# Patient Record
Sex: Male | Born: 1940
Health system: Southern US, Community
[De-identification: ages and names within clinical notes are randomized; demographics above are authoritative.]

## PROBLEM LIST (undated history)

## (undated) ENCOUNTER — Telehealth

## (undated) ENCOUNTER — Encounter

## (undated) ENCOUNTER — Ambulatory Visit

## (undated) ENCOUNTER — Encounter: Attending: Nurse Practitioner | Primary: Nurse Practitioner

## (undated) ENCOUNTER — Ambulatory Visit: Payer: MEDICARE

## (undated) ENCOUNTER — Telehealth: Attending: Nurse Practitioner | Primary: Nurse Practitioner

## (undated) ENCOUNTER — Telehealth: Attending: Hematology & Oncology | Primary: Hematology & Oncology

## (undated) ENCOUNTER — Encounter: Attending: Urology | Primary: Urology

## (undated) ENCOUNTER — Telehealth: Attending: Otolaryngology | Primary: Otolaryngology

## (undated) ENCOUNTER — Encounter: Attending: Otolaryngology | Primary: Otolaryngology

## (undated) ENCOUNTER — Encounter: Attending: Medical Oncology | Primary: Medical Oncology

## (undated) ENCOUNTER — Telehealth: Attending: Children | Primary: Children

## (undated) ENCOUNTER — Encounter: Attending: Family | Primary: Family

## (undated) ENCOUNTER — Ambulatory Visit: Payer: MEDICARE | Attending: Vascular Surgery | Primary: Vascular Surgery

## (undated) ENCOUNTER — Ambulatory Visit: Payer: MEDICARE | Attending: Nurse Practitioner | Primary: Nurse Practitioner

## (undated) ENCOUNTER — Ambulatory Visit
Attending: Student in an Organized Health Care Education/Training Program | Primary: Student in an Organized Health Care Education/Training Program

## (undated) ENCOUNTER — Encounter: Attending: Children | Primary: Children

## (undated) ENCOUNTER — Inpatient Hospital Stay

## (undated) DIAGNOSIS — I4891 Unspecified atrial fibrillation: Secondary | ICD-10-CM

## (undated) DIAGNOSIS — M1990 Unspecified osteoarthritis, unspecified site: Secondary | ICD-10-CM

## (undated) DIAGNOSIS — I712 Thoracic aortic aneurysm, without rupture: Secondary | ICD-10-CM

## (undated) DIAGNOSIS — I10 Essential (primary) hypertension: Secondary | ICD-10-CM

## (undated) DIAGNOSIS — I9789 Other postprocedural complications and disorders of the circulatory system, not elsewhere classified: Secondary | ICD-10-CM

## (undated) DIAGNOSIS — I480 Paroxysmal atrial fibrillation: Secondary | ICD-10-CM

## (undated) DIAGNOSIS — I7121 Aneurysm of the ascending aorta, without rupture: Secondary | ICD-10-CM

## (undated) DIAGNOSIS — I5032 Chronic diastolic (congestive) heart failure: Secondary | ICD-10-CM

## (undated) DIAGNOSIS — I471 Supraventricular tachycardia, unspecified: Secondary | ICD-10-CM

## (undated) DIAGNOSIS — E782 Mixed hyperlipidemia: Secondary | ICD-10-CM

## (undated) DIAGNOSIS — I219 Acute myocardial infarction, unspecified: Secondary | ICD-10-CM

## (undated) DIAGNOSIS — I739 Peripheral vascular disease, unspecified: Secondary | ICD-10-CM

## (undated) DIAGNOSIS — N400 Enlarged prostate without lower urinary tract symptoms: Secondary | ICD-10-CM

## (undated) DIAGNOSIS — I2510 Atherosclerotic heart disease of native coronary artery without angina pectoris: Secondary | ICD-10-CM

## (undated) HISTORY — DX: Mixed hyperlipidemia: E78.2

## (undated) HISTORY — PX: HERNIA REPAIR: SHX51

## (undated) HISTORY — DX: Other postprocedural complications and disorders of the circulatory system, not elsewhere classified: I97.89

## (undated) HISTORY — PX: ASCENDING AORTIC ANEURYSM REPAIR: SHX1191

## (undated) HISTORY — DX: Atherosclerotic heart disease of native coronary artery without angina pectoris: I25.10

## (undated) HISTORY — DX: Unspecified atrial fibrillation: I48.91

## (undated) HISTORY — PX: TRANSURETHRAL RESECTION OF PROSTATE: SHX73

## (undated) HISTORY — DX: Paroxysmal atrial fibrillation: I48.0

## (undated) HISTORY — DX: Essential (primary) hypertension: I10

## (undated) HISTORY — DX: Peripheral vascular disease, unspecified: I73.9

## (undated) HISTORY — DX: Thoracic aortic aneurysm, without rupture: I71.2

## (undated) HISTORY — DX: Aneurysm of the ascending aorta, without rupture: I71.21

## (undated) HISTORY — DX: Supraventricular tachycardia: I47.1

## (undated) HISTORY — DX: Supraventricular tachycardia, unspecified: I47.10

## (undated) HISTORY — PX: CORONARY ARTERY BYPASS GRAFT: SHX141

---
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
## 2000-04-04 ENCOUNTER — Inpatient Hospital Stay (HOSPITAL_COMMUNITY): Admission: EM | Admit: 2000-04-04 | Discharge: 2000-04-06 | Payer: Self-pay | Admitting: Internal Medicine

## 2000-09-03 ENCOUNTER — Encounter: Payer: Self-pay | Admitting: Cardiology

## 2000-09-03 ENCOUNTER — Ambulatory Visit (HOSPITAL_COMMUNITY): Admission: RE | Admit: 2000-09-03 | Discharge: 2000-09-03 | Payer: Self-pay | Admitting: Cardiology

## 2000-09-05 ENCOUNTER — Ambulatory Visit (HOSPITAL_COMMUNITY): Admission: RE | Admit: 2000-09-05 | Discharge: 2000-09-06 | Payer: Self-pay | Admitting: Cardiology

## 2001-05-11 ENCOUNTER — Inpatient Hospital Stay (HOSPITAL_COMMUNITY): Admission: EM | Admit: 2001-05-11 | Discharge: 2001-05-13 | Payer: Self-pay | Admitting: Cardiology

## 2001-05-11 ENCOUNTER — Encounter: Payer: Self-pay | Admitting: Emergency Medicine

## 2001-05-29 ENCOUNTER — Encounter: Payer: Self-pay | Admitting: Cardiology

## 2001-05-29 ENCOUNTER — Ambulatory Visit (HOSPITAL_COMMUNITY): Admission: RE | Admit: 2001-05-29 | Discharge: 2001-05-29 | Payer: Self-pay | Admitting: Cardiology

## 2002-03-24 ENCOUNTER — Encounter (HOSPITAL_COMMUNITY): Admission: RE | Admit: 2002-03-24 | Discharge: 2002-04-23 | Payer: Self-pay | Admitting: Cardiology

## 2002-03-24 ENCOUNTER — Encounter: Payer: Self-pay | Admitting: Cardiology

## 2002-08-27 ENCOUNTER — Encounter: Payer: Self-pay | Admitting: Cardiology

## 2002-08-27 ENCOUNTER — Ambulatory Visit (HOSPITAL_COMMUNITY): Admission: RE | Admit: 2002-08-27 | Discharge: 2002-08-28 | Payer: Self-pay | Admitting: Cardiology

## 2005-04-18 ENCOUNTER — Inpatient Hospital Stay (HOSPITAL_COMMUNITY): Admission: EM | Admit: 2005-04-18 | Discharge: 2005-04-19 | Payer: Self-pay | Admitting: Emergency Medicine

## 2005-04-19 ENCOUNTER — Ambulatory Visit: Payer: Self-pay | Admitting: Cardiology

## 2005-11-20 ENCOUNTER — Emergency Department (HOSPITAL_COMMUNITY): Admission: EM | Admit: 2005-11-20 | Discharge: 2005-11-20 | Payer: Self-pay | Admitting: Emergency Medicine

## 2006-09-04 ENCOUNTER — Ambulatory Visit: Payer: Self-pay | Admitting: Cardiology

## 2007-04-22 ENCOUNTER — Ambulatory Visit: Payer: Self-pay | Admitting: Cardiology

## 2007-06-25 ENCOUNTER — Ambulatory Visit: Payer: Self-pay | Admitting: Cardiology

## 2008-03-02 ENCOUNTER — Ambulatory Visit: Payer: Self-pay | Admitting: Cardiology

## 2008-07-28 ENCOUNTER — Ambulatory Visit: Payer: Self-pay | Admitting: Cardiology

## 2008-08-16 ENCOUNTER — Ambulatory Visit: Payer: Self-pay | Admitting: Cardiology

## 2008-08-16 ENCOUNTER — Encounter (HOSPITAL_COMMUNITY): Admission: RE | Admit: 2008-08-16 | Discharge: 2008-09-15 | Payer: Self-pay | Admitting: Pulmonary Disease

## 2008-08-27 ENCOUNTER — Ambulatory Visit: Payer: Self-pay | Admitting: Cardiology

## 2008-10-13 ENCOUNTER — Encounter: Payer: Self-pay | Admitting: Cardiology

## 2008-11-12 ENCOUNTER — Encounter: Payer: Self-pay | Admitting: Cardiology

## 2009-07-04 ENCOUNTER — Telehealth (INDEPENDENT_AMBULATORY_CARE_PROVIDER_SITE_OTHER): Payer: Self-pay

## 2009-07-20 DIAGNOSIS — E782 Mixed hyperlipidemia: Secondary | ICD-10-CM | POA: Insufficient documentation

## 2009-07-20 DIAGNOSIS — I25119 Atherosclerotic heart disease of native coronary artery with unspecified angina pectoris: Secondary | ICD-10-CM | POA: Insufficient documentation

## 2009-07-20 DIAGNOSIS — I10 Essential (primary) hypertension: Secondary | ICD-10-CM | POA: Insufficient documentation

## 2009-07-20 DIAGNOSIS — R079 Chest pain, unspecified: Secondary | ICD-10-CM | POA: Insufficient documentation

## 2009-07-20 DIAGNOSIS — I498 Other specified cardiac arrhythmias: Secondary | ICD-10-CM | POA: Insufficient documentation

## 2009-08-26 ENCOUNTER — Ambulatory Visit: Payer: Self-pay | Admitting: Cardiology

## 2009-08-31 ENCOUNTER — Encounter (INDEPENDENT_AMBULATORY_CARE_PROVIDER_SITE_OTHER): Payer: Self-pay | Admitting: *Deleted

## 2009-08-31 ENCOUNTER — Encounter: Payer: Self-pay | Admitting: Cardiology

## 2009-08-31 LAB — CONVERTED CEMR LAB
ALT: 13 units/L
AST: 14 units/L
Alkaline Phosphatase: 64 units/L
BUN: 16 mg/dL
Calcium: 9.2 mg/dL
Cholesterol: 113 mg/dL
HDL: 35 mg/dL
Potassium: 4.4 meq/L
Triglycerides: 155 mg/dL

## 2009-09-05 ENCOUNTER — Encounter: Payer: Self-pay | Admitting: Cardiovascular Disease

## 2009-09-05 ENCOUNTER — Encounter: Payer: Self-pay | Admitting: Cardiology

## 2009-09-05 LAB — CONVERTED CEMR LAB
ALT: 13 units/L (ref 0–53)
AST: 14 units/L (ref 0–37)
CO2: 24 meq/L (ref 19–32)
Calcium: 9.2 mg/dL (ref 8.4–10.5)
Chloride: 104 meq/L (ref 96–112)
Cholesterol: 113 mg/dL (ref 0–200)
Sodium: 139 meq/L (ref 135–145)
Total Bilirubin: 0.6 mg/dL (ref 0.3–1.2)
Total Protein: 6.9 g/dL (ref 6.0–8.3)
VLDL: 31 mg/dL (ref 0–40)

## 2009-12-23 ENCOUNTER — Encounter (INDEPENDENT_AMBULATORY_CARE_PROVIDER_SITE_OTHER): Payer: Self-pay | Admitting: *Deleted

## 2009-12-28 ENCOUNTER — Ambulatory Visit: Payer: Self-pay | Admitting: Cardiology

## 2009-12-28 ENCOUNTER — Encounter: Payer: Self-pay | Admitting: Cardiology

## 2009-12-28 DIAGNOSIS — I319 Disease of pericardium, unspecified: Secondary | ICD-10-CM | POA: Insufficient documentation

## 2009-12-28 DIAGNOSIS — R55 Syncope and collapse: Secondary | ICD-10-CM | POA: Insufficient documentation

## 2010-01-04 ENCOUNTER — Encounter: Payer: Self-pay | Admitting: Cardiology

## 2010-01-04 ENCOUNTER — Encounter (HOSPITAL_COMMUNITY): Admission: RE | Admit: 2010-01-04 | Discharge: 2010-01-04 | Payer: Self-pay | Admitting: Cardiology

## 2010-01-04 ENCOUNTER — Ambulatory Visit: Payer: Self-pay | Admitting: Cardiology

## 2010-01-12 ENCOUNTER — Encounter: Payer: Self-pay | Admitting: Cardiology

## 2010-02-15 ENCOUNTER — Telehealth (INDEPENDENT_AMBULATORY_CARE_PROVIDER_SITE_OTHER): Payer: Self-pay

## 2010-03-01 LAB — CONVERTED CEMR LAB
AST: 12 units/L (ref 0–37)
Albumin: 4 g/dL (ref 3.5–5.2)
Bilirubin, Direct: 0.1 mg/dL (ref 0.0–0.3)
HDL: 36 mg/dL — ABNORMAL LOW (ref >39)
LDL Cholesterol: 56 mg/dL (ref 0–99)
PSA: 16.87 ng/mL — ABNORMAL HIGH (ref 0.10–4.00)
Total Bilirubin: 0.5 mg/dL (ref 0.3–1.2)
Total CHOL/HDL Ratio: 3.2
VLDL: 23 mg/dL (ref 0–40)

## 2010-03-29 ENCOUNTER — Telehealth (INDEPENDENT_AMBULATORY_CARE_PROVIDER_SITE_OTHER): Payer: Self-pay

## 2010-04-13 ENCOUNTER — Ambulatory Visit: Payer: Self-pay | Admitting: Cardiology

## 2010-04-13 DIAGNOSIS — I712 Thoracic aortic aneurysm, without rupture, unspecified: Secondary | ICD-10-CM | POA: Insufficient documentation

## 2010-04-13 DIAGNOSIS — I2584 Coronary atherosclerosis due to calcified coronary lesion: Secondary | ICD-10-CM | POA: Insufficient documentation

## 2010-04-13 DIAGNOSIS — I2510 Atherosclerotic heart disease of native coronary artery without angina pectoris: Secondary | ICD-10-CM | POA: Insufficient documentation

## 2010-04-13 DIAGNOSIS — I714 Abdominal aortic aneurysm, without rupture, unspecified: Secondary | ICD-10-CM | POA: Insufficient documentation

## 2010-04-14 ENCOUNTER — Ambulatory Visit (HOSPITAL_COMMUNITY): Admission: RE | Admit: 2010-04-14 | Discharge: 2010-04-14 | Payer: Self-pay | Admitting: Cardiology

## 2010-04-14 DIAGNOSIS — E041 Nontoxic single thyroid nodule: Secondary | ICD-10-CM | POA: Insufficient documentation

## 2010-04-14 LAB — CONVERTED CEMR LAB
CO2: 24 meq/L (ref 19–32)
Chloride: 103 meq/L (ref 96–112)
Potassium: 4.2 meq/L (ref 3.5–5.3)
Sodium: 137 meq/L (ref 135–145)

## 2010-04-17 ENCOUNTER — Ambulatory Visit (HOSPITAL_COMMUNITY)
Admission: RE | Admit: 2010-04-17 | Discharge: 2010-04-17 | Payer: Self-pay | Source: Home / Self Care | Admitting: Cardiology

## 2010-04-25 ENCOUNTER — Encounter: Payer: Self-pay | Admitting: Cardiology

## 2010-04-25 ENCOUNTER — Ambulatory Visit: Payer: Self-pay | Admitting: Surgery

## 2010-06-04 ENCOUNTER — Encounter: Payer: Self-pay | Admitting: *Deleted

## 2010-06-13 NOTE — Progress Notes (Signed)
Summary: Labs   Phone Note Outgoing Call   Call placed by: Larita Fife Via LPN,  February 15, 2010 1:37 PM Summary of Call: Pt. forgot to have labs drawn, we will mail new orders. Also, pt. asked if we could add a psa to labs. Initial call taken by: Larita Fife Via LPN,  February 15, 2010 1:39 PM

## 2010-06-13 NOTE — Progress Notes (Signed)
Summary: Results   Phone Note Call from Patient Call back at [phone]   Caller: Patient Reason for Call: Talk to Nurse Summary of Call: patient was upset that no one called him with results of labs that were done 03/01/10 / pls call patient with results / tg Initial call taken by: Raechel Ache [facility]  March 29, 2010 4:01 PM  Follow-up for Phone Call        Pt. given lab results and per pt's request, a copy of results mailed to pt.  Follow-up by: Larita Fife Via LPN,  March 30, 2010 9:09 AM

## 2010-06-13 NOTE — Letter (Signed)
Summary: [name] [facility] Lab Work Engineer, agricultural at [employer]  [address], [place] [address]   Phone: [phone]  Fax: [phone]     December 28, 2009 MRN: [id number]   Eric Beltran [address] [place], [place]  [address]      YOUR LAB WORK IS DUE  February 08, 2010 _________________________________________  Please go to [facility], located across the street from [facility] on the second floor.  Hours are Monday - Friday 7am until 7:30pm         Saturday 8am until 12noon    _X_  DO NOT EAT OR DRINK AFTER MIDNIGHT EVENING PRIOR TO LABWORK  __ YOUR LABWORK IS NOT FASTING --YOU MAY EAT PRIOR TO LABWORK

## 2010-06-13 NOTE — Assessment & Plan Note (Signed)
Summary: 1 YR F/U PER REMINDER LISTT/TG      Allergies Added: NKDA  Visit Type:  Follow-up Primary Provider:  [name]  CC:  No Cardiology Complaints.  History of Present Illness: Mr Eric Beltran comes in today for evaluation and management his coronary artery disease, hyperlipidemia, and hypertension.  He is having no angina or ischemic symptoms. His last stress test was about a year ago which was remarkable for an ejection fraction of 55%, hypertensive blood pressure response to exercise, and a very small area of inferior lateral redistribution.  He is overdue for lipids and LFTs.  His very compliant with his medications. He's lost about 4 pounds.  Current Medications (verified): 1)  Cialis 5 Mg Tabs (Tadalafil) .... Take 1 Tablet Prior To Sexual Activity 2)  Vytorin 10-20 Mg Tabs (Ezetimibe-Simvastatin) .... Take 1 Tablet By Mouth At Bedtime 3)  Diltiazem Hcl Er Beads 240 Mg Xr24h-Cap (Diltiazem Hcl Er Beads) .... Take 1 Tablet By Mouth Once Daily 4)  Altace 10 Mg Caps (Ramipril) .... Take 1 Tablet By Mouth Once Daily 5)  Aspir-Trin 325 Mg Tbec (Aspirin) .... Take 1 Tab Daily 6)  Flomax 0.4 Mg Caps (Tamsulosin Hcl) .... Take 1 Tab Daily 7)  Hydrochlorothiazide 25 Mg Tabs (Hydrochlorothiazide) .... Take 1 Tab Daily 8)  Fish Oil 1000 Mg Caps (Omega-3 Fatty Acids) .... Take 1 Cap Daily 9)  Daily Multi  Tabs (Multiple Vitamins-Minerals) .... Take 1 Tab Daily  Allergies (verified): No Known Drug Allergies  Past History:  Past Medical History: Last updated: 08/18/2009 Current Problems:  DYSLIPIDEMIA (ICD-272.4) HYPERTENSION (ICD-401.9) CAD (ICD-414.00) CHEST PAIN (ICD-786.50) SUPRAVENTRICULAR TACHYCARDIA (ICD-427.89)  Past Surgical History: Last updated: 08-18-2009 cath with stenting   Family History: Last updated: August 18, 2009 Father:deceased cause unknown Mother:deceased cause unknown  Social History: Last updated: 08-18-09 Retired  Widowed    Tobacco Use - No.  Alcohol Use - no Regular Exercise - no Drug Use - no  Risk Factors: Exercise: no (2009-08-18)  Risk Factors: Smoking Status: never (08/18/09)  Review of Systems       negative history of present illness  Vital Signs:  Patient profile:   70 year old male Height:      68 inches Weight:      187 pounds BMI:     28.54 Pulse rate:   55 / minute BP sitting:   123 / 72  (right arm)  Vitals Entered By: Dreama Saa, CNA (August 26, 2009 3:31 PM)  Physical Exam  General:  Well developed, well nourished, in no acute distress. ruddy complexion which is baseline Head:  normocephalic and atraumatic Eyes:  PERRLA/EOM intact; conjunctiva and lids normal. Neck:  Neck supple, no JVD. No masses, thyromegaly or abnormal cervical nodes. Chest Carianne Taira:  no deformities or breast masses noted Lungs:  Clear bilaterally to auscultation and percussion. Heart:  Non-displaced PMI, chest non-tender; regular rate and rhythm, S1, S2 without murmurs, rubs or gallops. Carotid upstroke normal, no bruit. Normal abdominal aortic size, no bruits. Femorals normal pulses, no bruits. Pedals normal pulses. No edema, no varicosities. Abdomen:  Bowel sounds positive; abdomen soft and non-tender without masses, organomegaly, or hernias noted. No hepatosplenomegaly. Msk:  Back normal, normal gait. Muscle strength and tone normal. Pulses:  pulses normal in all 4 extremities Extremities:  No clubbing or cyanosis. Neurologic:  Alert and oriented x 3. Skin:  Intact without lesions or rashes. Psych:  Normal affect.   Impression & Recommendations:  Problem # 1:  CAD (ICD-414.00) Assessment  Unchanged  His updated medication list for this problem includes:    Diltiazem Hcl Er Beads 240 Mg Xr24h-cap (Diltiazem hcl er beads) .[name]... Take 1 tablet by mouth once daily    Altace 10 Mg Caps (Ramipril) .[name]... Take 1 tablet by mouth once daily    Aspir-trin 325 Mg Tbec (Aspirin) .[name]... Take 1 tab daily  His  updated medication list for this problem includes:    Diltiazem Hcl Er Beads 240 Mg Xr24h-cap (Diltiazem hcl er beads) .[name]... Take 1 tablet by mouth once daily    Altace 10 Mg Caps (Ramipril) .[name]... Take 1 tablet by mouth once daily    Aspir-trin 325 Mg Tbec (Aspirin) .[name]... Take 1 tab daily  Problem # 2:  HYPERTENSION (ICD-401.9) Assessment: Improved  His updated medication list for this problem includes:    Diltiazem Hcl Er Beads 240 Mg Xr24h-cap (Diltiazem hcl er beads) .[name]... Take 1 tablet by mouth once daily    Altace 10 Mg Caps (Ramipril) .[name]... Take 1 tablet by mouth once daily    Aspir-trin 325 Mg Tbec (Aspirin) .[name]... Take 1 tab daily    Hydrochlorothiazide 25 Mg Tabs (Hydrochlorothiazide) .[name]... Take 1 tab daily  Future Orders: T-Comprehensive Metabolic Panel ([id number]) ... 08/29/2009  Problem # 3:  DYSLIPIDEMIA (ICD-272.4) I will obtain blood work including lipids and a comprehensive metabolic panel. His updated medication list for this problem includes:    Vytorin 10-20 Mg Tabs (Ezetimibe-simvastatin) .[name]... Take 1 tablet by mouth at bedtime  Future Orders: T-Lipid Profile ([id number]) ... 08/29/2009 T-Comprehensive Metabolic Panel [id number]) ... 08/29/2009  Patient Instructions: 1)  Your physician recommends that you schedule a follow-up appointment in: 12 months 2)  Your physician recommends that you return for lab work on: Monday 3)  Your physician recommends that you continue on your current medications as directed. Please refer to the Current Medication list given to you today.

## 2010-06-13 NOTE — Letter (Signed)
Summary: [place] Results Engineer, agricultural at [facility]  [address] [place] [address]   Phone: [phone]  Fax: [phone]      September 05, 2009 MRN: [id number]   ELIJAH PHOMMACHANH [address] [place], [place]  [address]   Dear Mr. Acero,  Your test ordered by Selena Batten has been reviewed by your physician (or physician assistant) and was found to be normal or stable. Your physician (or physician assistant) felt no changes were needed at this time.  ____ Echocardiogram  ____ Cardiac Stress Test  __X__ Lab Work  ____ Peripheral vascular study of arms, legs or neck  ____ CT scan or X-ray  ____ Lung or Breathing test  ____ Other: Please continue on current medical treatment.  Thank you.  Valera Castle, MD, F.A.C.C

## 2010-06-13 NOTE — Letter (Signed)
Summary: [place] [facility] (Nuc Med Stress)  [facility]  [address] [place] [address]   Phone: [phone]  Fax: [phone]    Nuclear Medicine 1-Day Stress Test Information Sheet  Re:     Eric Beltran   DOB:     1940/05/26 MRN:     [id number] Weight:  Appointment Date: Register at: Appointment Time: Referring MD:  _X__Exercise Stress  __Adenosine   __Dobutamine  __Lexiscan  __Persantine   __Thallium  Urgency: ____1 (next day)   ____2 (one week)    ____3 (PRN)  Patient will receive Follow Up call with results: Patient needs follow-up appointment:  Instructions regarding medication:  How to prepare for your stress test: 1. DO NOT eat or dring 6 hours prior to your arrival time. This includes no caffeine (coffee, tea, sodas, chocolate) if you were instructed to take your medications, drink water with it. 2. DO NOT use any tobacco products for at leaset 8 hours prior to arrival. 3. DO NOT wear dresses or any clothing that may have metal clasps or buttons. 4. Wear short sleeve shirts, loose clothing, and comfortalbe walking shoes. 5. DO NOT use lotions, oils or powder on your chest before the test. 6. The test will take approximately 3-4 hours from the time you arrive until completion. 7. To register the day of the test, go to the Short Stay entrance at [facility]. 8. If you must cancel your test, call [phone] as soon as you are aware.  After you arrive for test:   When you arrive at [facility], you will go to Short Stay to be registered. They will then send you to Radiology to check in. The Nuclear Medicine Tech will get you and start an IV in your arm or hand. A small amount of a radioactive tracer will then be injected into your IV. This tracer will then have to circulate for 30-45 minutes. During this time you will wait in the waiting room and you will be able to drink something without caffeine. A series of pictures will be taken  of your heart follwoing this waiting period. After the 1st set of pictures you will go to the stress lab to get ready for your stress test. During the stress test, another small amount of a radioactive tracer will be injected through your IV. When the stress test is complete, there is a short rest period while your heart rate and blood pressure will be monitored. When this monitoring period is complete you will have another set of pictrues taken. (The same as the 1st set of pictures). These pictures are taken between 15 minutes and 1 hour after the stress test. The time depends on the type of stress test you had. Your doctor will inform you of your test results within 7 days after test.    The possibilities of certain changes are possible during the test. They include abnormal blood pressure and disorders of the heart. Side effects of persantine or adenosine can include flushing, chest pain, shortness of breath, stomach tightness, headache and light-headedness. These side effects usually do not last long and are self-resolving. Every effort will be made to keep you comfortable and to minimize complications by obtaining a medical history and by close observation during the test. Emergency equipment, medications, and trained personnel are available to deal with any unusual situation which may arise.  Please notify office at least 48 hours in advance if you are unable to keep  this appt.

## 2010-06-13 NOTE — Miscellaneous (Signed)
Summary: labs cmp,lipids 08/31/2009  Clinical Lists Changes  Observations: Added new observation of CALCIUM: 9.2 mg/dL (29/56/2130 86:57) Added new observation of ALBUMIN: 4.3 g/dL (84/69/6295 28:41) Added new observation of PROTEIN, TOT: 6.9 g/dL (32/44/0102 72:53) Added new observation of SGPT (ALT): 13 units/L (08/31/2009 16:34) Added new observation of SGOT (AST): 14 units/L (08/31/2009 16:34) Added new observation of ALK PHOS: 64 units/L (08/31/2009 16:34) Added new observation of CREATININE: 1.09 mg/dL (66/44/0347 42:59) Added new observation of BUN: 16 mg/dL (56/38/7564 33:29) Added new observation of BG RANDOM: 85 mg/dL (51/88/4166 06:30) Added new observation of CO2 PLSM/SER: 24 meq/L (08/31/2009 16:34) Added new observation of CL SERUM: 104 meq/L (08/31/2009 16:34) Added new observation of K SERUM: 4.4 meq/L (08/31/2009 16:34) Added new observation of NA: 139 meq/L (08/31/2009 16:34) Added new observation of LDL: 47 mg/dL (16/05/930 35:57) Added new observation of HDL: 35 mg/dL (32/20/2542 70:62) Added new observation of TRIGLYC TOT: 155 mg/dL (37/62/8315 17:61) Added new observation of CHOLESTEROL: 113 mg/dL (60/73/7106 26:94)

## 2010-06-13 NOTE — Assessment & Plan Note (Signed)
Summary: f76m  Medications Added LIPITOR 20 MG TABS (ATORVASTATIN CALCIUM) take 1 tablet by mouth      Allergies Added: NKDA  Visit Type:  Follow-up Primary Shadeed Colberg:  [name]  CC:  no cardiology complaints.  History of Present Illness: Mr Heyward returns today for evaluation and management of his coronary disease, history of multivessel PCI in 2004, hypertension, mixed hyperlipidemia, and history of a recent pericardial effusion. On his last visit, he had reported a syncopal event which I felt was vasovagal. Please refer to that note.  We performed a 2-D echocardiogram which showed only a trivial effusion. He had normal left ventricular function. He had mild mitral regurgitation. EF was 55%. His ascending aorta measured 46 mm. There is no aortic insufficiency.  We also performed an exercise stress Myoview. He had good exercise tolerance, a hypertensive response to exercise, and no significant change in minimal inferior ischemia.  He's had no angina or ischemic symptoms. He has not had taken nitroglycerin. He denies any back pain or abdominal pain. Has no claudication. He has never been a smoker.  Laboratory data which I reviewed with him today showed all of his numbers to be a goal except a low HDL.  He reports aching on Lipitor. He had the same problem on Crestor. He said he tolerated Vytorin the best  He is had no recurrent syncope.  Current Medications (verified): 1)  Cialis 5 Mg Tabs (Tadalafil) .... Take 1 Tablet Prior To Sexual Activity 2)  Lipitor 20 Mg Tabs (Atorvastatin Calcium) .... Take 1 Tablet By Mouth 3)  Diltiazem Hcl Er Beads 240 Mg Xr24h-Cap (Diltiazem Hcl Er Beads) .... Take 1 Tablet By Mouth Once Daily 4)  Altace 10 Mg Caps (Ramipril) .... Take 1 Tablet By Mouth Once Daily 5)  Aspir-Trin 325 Mg Tbec (Aspirin) .... Take 1 Tab Daily 6)  Flomax 0.4 Mg Caps (Tamsulosin Hcl) .... Take 1 Tab Daily 7)  Fish Oil 1000 Mg Caps (Omega-3 Fatty Acids) ....  Take 1 Cap Daily 8)  Daily Multi  Tabs (Multiple Vitamins-Minerals) .... Take 1 Tab Daily 9)  Diltiazem Hcl 60 Mg Tabs (Diltiazem Hcl) .... Take 1 Tablet As Needed For Palpitations  Allergies (verified): No Known Drug Allergies  Comments:  Nurse/Medical Assistant: patient didn't bring meds or list reviewed from previous ov stated all meds are correct.  Past History:  Past Medical History: Last updated: 23-Jul-2009 Current Problems:  DYSLIPIDEMIA (ICD-272.4) HYPERTENSION (ICD-401.9) CAD (ICD-414.00) CHEST PAIN (ICD-786.50) SUPRAVENTRICULAR TACHYCARDIA (ICD-427.89)  Past Surgical History: Last updated: 23-Jul-2009 cath with stenting   Family History: Last updated: 07/23/2009 Father:deceased cause unknown Mother:deceased cause unknown  Social History: Last updated: Jul 23, 2009 Retired  Widowed  Tobacco Use - No.  Alcohol Use - no Regular Exercise - no Drug Use - no  Risk Factors: Exercise: no (07-23-2009)  Risk Factors: Smoking Status: never (2009/07/23)  Review of Systems       negative other than history of present illness  Vital Signs:  Patient profile:   70 year old male Weight:      182 pounds O2 Sat:      94 % on Room air Pulse rate:   64 / minute BP sitting:   127 / 73  (right arm)  Vitals Entered By: Dreama Saa, CNA (April 13, 2010 11:22 AM)  O2 Flow:  Room air  Physical Exam  General:  Well developed, well nourished, in no acute distress. Head:  normocephalic and atraumatic Eyes:  PERRLA/EOM intact;  conjunctiva and lids normal. Neck:  Neck supple, no JVD. No masses, thyromegaly or abnormal cervical nodes. Chest Wall:  no deformities or breast masses noted Lungs:  Clear bilaterally to auscultation and percussion. Heart:  PMI nondisplaced, normal S1-S2, no murmur systolic or diastolic, regular rate and rhythm, no carotid bruits Abdomen:  soft, good bowel sounds, midline bruit Msk:  Back normal, normal gait. Muscle strength and tone  normal. Pulses:  good pulses in lower extremities Extremities:  No clubbing or cyanosis. Neurologic:  Alert and oriented x 3. Skin:  Intact without lesions or rashes. Psych:  Normal affect.   Impression & Recommendations:  Problem # 1:  AAA (ICD-441.4) Assessment New Will obtain CT angiography through radiology of both his chest and abdomen.  Problem # 2:  THORACIC ANEURYSM WITHOUT MENTION OF RUPTURE (ICD-441.2) Assessment: New  Orders: CT with Contrast (CT w/ contrast)  Problem # 3:  CORONARY ATHEROSCL DUE TO CALCIFIED CORON LESION (ICD-414.4) Assessment: Unchanged  Orders: CT with Contrast (CT w/ contrast)  Problem # 4:  DYSLIPIDEMIA (ICD-272.4) I asked him to stop his Lipitor to see if his arthralgias resolve. If they do he will let us know and we will prescribe Vytorin. His updated medication list for this problem includes:    Lipitor 20 Mg Tabs (Atorvastatin calcium) .[name]... Take 1 tablet by mouth  Problem # 5:  PERICARDIAL EFFUSION (ICD-423.9) Assessment: Improved  His updated medication list for this problem includes:    Altace 10 Mg Caps (Ramipril) .[name]... Take 1 tablet by mouth once daily  Problem # 6:  SYNCOPE AND COLLAPSE (ICD-780.2) Assessment: Improved  His updated medication list for this problem includes:    Diltiazem Hcl Er Beads 240 Mg Xr24h-cap (Diltiazem hcl er beads) .[name]... Take 1 tablet by mouth once daily    Altace 10 Mg Caps (Ramipril) .[name]... Take 1 tablet by mouth once daily    Aspir-trin 325 Mg Tbec (Aspirin) .[name]... Take 1 tab daily    Diltiazem Hcl 60 Mg Tabs (Diltiazem hcl) .[name]... Take 1 tablet as needed for palpitations  Other Orders: T-Basic Metabolic Panel [phone])  Clinical Reports Reviewed:  Cardiac Cath:  08/27/2002: Cardiac Cath Findings:   RESULTS:  Initially the stenosis in the intermedius branch was 99% with TIMI-  1 flow.  Following stenting, this improved to less than 10% with TIMI-3  flow.  The stenosis in the ostium of the  circumflex artery was initially  50%, and following stenting in the intermedius, it increased to 70% to 80%,  and following stenting, it improved to less than 10%.   CONCLUSION:  Successful placement of Taxus stents at the ostium of the ramus  branch of the circumflex artery and the ostium of the AV circumflex artery  using V-stenting with improvement in the ramus stenosis from 99% to less  than 10%, and improvement in the ostial stenosis of the circumflex artery  from 50% to less than 10.   DISPOSITION:  The patient was returned to the post angioplasty unit for  further observation.                                             Charlies Constable, M.D. [facility]   BB/MEDQ  D:  08/27/2002  T:  08/28/2002  Job:  [id number]   cc:   Ramon Dredge L. Juanetta Gosling, M.D.  05/12/2001: Cardiac Cath Findings:  CONCLUSION: 1.  Coronary artery disease status post prior stenting and prior cutting    balloon angioplasty and brachytherapy for restenosis in the circumflex    intermedius vessel with 95% stenoses at both edges of the stent in the    circumflex intermedius vessel, 40% ostial stenosis of the circumflex    artery, 60-70% proximal stenosis in the left anterior descending artery    with 70% ostial stenosis of the first diagonal branch, 40% proximal and    distal stenosis in the right coronary artery with 70% stenosis in the    first posterolateral branch and normal left ventricular function. 2. Successful cutting balloon angioplasty of the peri-stent restenoses in    the circumflex intermedius vessel with improvement in the distal edge    stenosis from 95% to less than 10% and improvement in the proximal edge    stenosis from 95% to 40%.  DISPOSITION:  We were not able to perform as many balloon inflations and prolonged inflations at the proximal peri-stent restenoses as we would like because of concern about compromising the AV circumflex artery.  We had a fair result in this area.  I would recommend that we do an  early stress test at two weeks.  If he has persistent ischemia, then we probably should bring him back for restudy and consider either rotational atherectomy of the intermedius branch or possible bypass surgery.  If his Cardiolite scan is nonischemic at two weeks, then he can be followed clinically with another follow-up scan suggested at six months. Dictated by:   Everardo Beals Juanda Chance, M.D. [facility] Attending:  Everardo Beals Juanda Chance, M.D. [facility] DD:  05/12/01  Nuclear Study:  08/16/2008:   IMPRESSION:   Abnormal stress nuclear myocardial study revealing excellent   exercise tolerance, mild resting hypertension with moderate   hypertension during exertion, a negative stress EKG, mild left   ventricular dilatation and normal left ventricular systolic   function.  By scintigraphic imaging, there was mild ischemia   inferolaterally.  Other findings as noted.    Read By:  [place] Bing,  M.D.   Released By:  [place] Bing,  M.D.  Additional Information  HL7 RESULT STATUS : F  External image : [phone]  03/24/2002:   BRIEF HISTORY:  The patient is a 70 year old male with a history of coronary  artery disease followed by Dr. Daleen Squibb.  He had a non Q-wave MI in November  2001.  He is status post PTCA and brachytherapy for instent restenosis of a  ramus vessel performed September 05, 2000.  The patient recently saw Dr. Daleen Squibb in  the office.  He was not having any significant symptoms.  However, he was  scheduled for an exercise Cardiolite to further evaluate for progression of  coronary artery disease.   Prior to the study the patient had no complaints of recent chest pain or  shortness of breath.  His baseline EKG showed sinus rhythm, rate 64 beats  per minute with poor R-wave progression, but no significant abnormalities.  Blood pressure was 128/86.  Target heart rate was 136.   The patient was able to exercise for a total of 12 minutes and 48 seconds  reaching a maximum heart rate of 165 beats  per minute.  As noted, his target  heart rate was 136 beats per minute.  He was injected with the Cardiolite at  9 minutes 4 seconds into the study at which time his heart rate was 130.  His blood pressure did elevate to 220/110 with  exercise.   The patient experienced no chest pain.  He did have some shortness of  breath.  There were no significant EKG changes.  The blood pressure returned  to 180/88 in recovery.  The patient regained his breath and the final images  are pending at time of this dictation.   [name], P.A. [facility]                  Thomas C. Wall, M.D. [facility]   DR/MEDQ  D:  03/24/2002  T:  03/24/2002  Job:  [id number]  06/01/2001:  EXERCISE STRESS TEST  CHART NUMBER:  [id number]  INDICATIONS:  Mr. Godeaux is a 70 year old male with known coronary artery disease, notable for history of non-Q-wave MI/stent ramus intermedius, treated with cutting balloon, PTCA/brachytherapy this past April secondary to in-stent restenosis and who, more recently, underwent cutting balloon angioplasty secondary to 95% peri-stent restenosis.  TEST:  The patient exercised a total of 12 minutes and 15 seconds on standard Bruce protocol.  The test was discontinued secondary to fatigue.  The patient reported no chest discomfort throughout the procedure.  Baseline heart rate 54 to maximum 152 (94% of PMHR; 12.9 METs).  Blood pressure rose from 114/74 baseline to 210/90 maximum, and a final reading of 168/80.  Serial EKG tracings revealed no significant ST abnormalities.  No dysrhythmias noted.  CONCLUSION:  Negative adequate treadmill test; Cardiolite images pending. Attending Physician:  Cain Sieve DD:  05/29/01   Patient Instructions: 1)  Your physician recommends that you schedule a follow-up appointment in: 6 months 2)  Your physician recommends that you return for lab work in: today 3)  Your physician has recommended you make the following change in your medication: stop taking  Lipitor for 4 weeks, if symptoms stop then switch to Vytorin 4)  Your physician has requested that you have a cardiac CT.  Cardiac computed tomography (CT) is a painless test that uses an x-ray machine to take clear, detailed pictures of your heart.  For further information please visit [email or web address]  Please follow instruction sheet as given.  Appended Document: Orders Update    Clinical Lists Changes  Problems: Added new problem of THYROID NODULE (ICD-241.0) Orders: Added new Referral order of CT Scan  (CT Scan) - Signed Added new Test order of CT with Contrast (CT w/ contrast) - Signed Added new Test order of Ultrasound (Ultrasound) - Signed Added new Referral order of TCTS Referral (TCTS Ref) - Signed

## 2010-06-13 NOTE — Progress Notes (Signed)
**Note De-Identified Eric Beltran Obfuscation** Summary: Refill   Phone Note Call from Patient   Caller: Patient Reason for Call: Refill Medication Summary of Call: pt needs refill for Cialis called to [facility] in [place]/tg Initial call taken by: Raechel Ache [facility],  July 04, 2009 3:13 PM  Follow-up for Phone Call        No answer,left message. Patient is 4  months overdue for f/u with Dr. Daleen Squibb.  Follow-up by: Larita Fife Deshone Lyssy LPN,  July 04, 2009 4:10 PM  Additional Follow-up for Phone Call Additional follow up Details #1::        Patient advised and made appt. for July 22, 2009. Additional Follow-up by: Larita Fife Peniel Biel LPN,  July 04, 2009 4:11 PM    Prescriptions: CIALIS 5 MG TABS (TADALAFIL) take 1 tablet prior to sexual activity  #10 x 3   Entered by:   Larita Fife Jensine Luz LPN   Authorized by:   Gaylord Shih, MD, [facility]   Signed by:   Larita Fife Dshawn Mcnay LPN on 69/48/5462   Method used:   Electronically to        [facility]* (retail)       [address]       [place], [place]  [address]       Ph: [phone]       Fax: [phone]   RxID:   [id number]

## 2010-06-13 NOTE — Assessment & Plan Note (Signed)
Summary: per pt request for syncope/tg  Medications Added LIPITOR 20 MG TABS (ATORVASTATIN CALCIUM) take 1 tablet by mouth DILTIAZEM HCL 60 MG TABS (DILTIAZEM HCL) take 1 tablet as needed for palpitations      Allergies Added: NKDA  Visit Type:  Follow-up Primary Eric Beltran:  Eric Beltran  CC:  no cardiology complaints.  History of Present Illness: Eric Beltran comes in today for evaluation and management of his coronary disease.  He reports an episode of syncope at a cocktail party on 3 June. He given a unit of blood the day before and worked out in the heat for 3 hours afterwards. He had 2 beers and suddenly became sweaty a little bit nauseated and blacked out.  He was evaluated at [facility]. I reviewed the records today. Chest x-ray showed some mild cardiomegaly, CT scan showed no acute intracranial abnormality but a small low density area representing perhaps a old tiny lacunar infarct. Laboratory data was unremarkable. His exam was unremarkable.cardiac enzymes were negative. EKG was stable which I reviewed today showing an old inferior infarct. Chest CT showed a moderate paracardial effusion but no sign of pulmonary embolus.  He denies any recurrent syncope since. He denies any chest pain either angina or pericarditic.  Of note, he did have an episode of rapid heart rate sitting in his office last Tuesday. He stood on his head several times with the help of his Diplomatic Services operational officer but it failed to convert. He then went outside and had her poor ice water on his head and it slowed down.  Looking back at his chart, he has a history of SVT diagnosed in the emergency room in July of 2008. At that time he required adenosine to break. He remains on diltiazem 240 mg a day.  His blood pressures been under good control. He has lost 10 pounds. He is relatively normal LV function with an old inferior wall defect. His last stress test was 2 years ago. He does have some mild dyspnea on  exertion.  On a result diltiazem and Vytorin. He is on 20 of simvastatin of the Vytorin derivative. We will have to make changes today.  Clinical Reports Reviewed:  Cardiac Cath:  08/27/2002: Cardiac Cath Findings:   RESULTS:  Initially the stenosis in the intermedius branch was 99% with TIMI-  1 flow.  Following stenting, this improved to less than 10% with TIMI-3  flow.  The stenosis in the ostium of the circumflex artery was initially  50%, and following stenting in the intermedius, it increased to 70% to 80%,  and following stenting, it improved to less than 10%.   CONCLUSION:  Successful placement of Taxus stents at the ostium of the ramus  branch of the circumflex artery and the ostium of the AV circumflex artery  using V-stenting with improvement in the ramus stenosis from 99% to less  than 10%, and improvement in the ostial stenosis of the circumflex artery  from 50% to less than 10.   DISPOSITION:  The patient was returned to the post angioplasty unit for  further observation.                                             Charlies Constable, M.D. [facility]   BB/MEDQ  D:  08/27/2002  T:  08/28/2002  Job:  [id number]   cc:   Ramon Dredge L. Juanetta Gosling, M.D.  05/12/2001: Cardiac Cath Findings:  CONCLUSION: 1. Coronary artery disease status post prior stenting and prior cutting    balloon angioplasty and brachytherapy for restenosis in the circumflex    intermedius vessel with 95% stenoses at both edges of the stent in the    circumflex intermedius vessel, 40% ostial stenosis of the circumflex    artery, 60-70% proximal stenosis in the left anterior descending artery    with 70% ostial stenosis of the first diagonal branch, 40% proximal and    distal stenosis in the right coronary artery with 70% stenosis in the    first posterolateral branch and normal left ventricular function. 2. Successful cutting balloon angioplasty of the peri-stent restenoses in    the circumflex intermedius vessel with  improvement in the distal edge    stenosis from 95% to less than 10% and improvement in the proximal edge    stenosis from 95% to 40%.  DISPOSITION:  We were not able to perform as many balloon inflations and prolonged inflations at the proximal peri-stent restenoses as we would like because of concern about compromising the AV circumflex artery.  We had a fair result in this area.  I would recommend that we do an early stress test at two weeks.  If he has persistent ischemia, then we probably should bring him back for restudy and consider either rotational atherectomy of the intermedius branch or possible bypass surgery.  If his Cardiolite scan is nonischemic at two weeks, then he can be followed clinically with another follow-up scan suggested at six months. Dictated by:   Everardo Beals Juanda Chance, M.D. [facility] Attending:  Everardo Beals Juanda Chance, M.D. [facility] DD:  05/12/01  Nuclear Study:  08/16/2008:   IMPRESSION:   Abnormal stress nuclear myocardial study revealing excellent   exercise tolerance, mild resting hypertension with moderate   hypertension during exertion, a negative stress EKG, mild left   ventricular dilatation and normal left ventricular systolic   function.  By scintigraphic imaging, there was mild ischemia   inferolaterally.  Other findings as noted.    Read By:  Holland Bing,  M.D.   Released By:  [place] Bing,  M.D.  Additional Information  HL7 RESULT STATUS : F  External image : [id number]  03/24/2002:   BRIEF HISTORY:  The patient is a 70 year old male with a history of coronary  artery disease followed by Dr. Daleen Squibb.  He had a non Q-wave MI in November  2001.  He is status post PTCA and brachytherapy for instent restenosis of a  ramus vessel performed September 05, 2000.  The patient recently saw Dr. Daleen Squibb in  the office.  He was not having any significant symptoms.  However, he was  scheduled for an exercise Cardiolite to further evaluate for progression of  coronary  artery disease.   Prior to the study the patient had no complaints of recent chest pain or  shortness of breath.  His baseline EKG showed sinus rhythm, rate 64 beats  per minute with poor R-wave progression, but no significant abnormalities.  Blood pressure was 128/86.  Target heart rate was 136.   The patient was able to exercise for a total of 12 minutes and 48 seconds  reaching a maximum heart rate of 165 beats per minute.  As noted, his target  heart rate was 136 beats per minute.  He was injected with the Cardiolite at  9 minutes 4 seconds into the study at which time his heart rate was 130.  His  blood pressure did elevate to 220/110 with exercise.   The patient experienced no chest pain.  He did have some shortness of  breath.  There were no significant EKG changes.  The blood pressure returned  to 180/88 in recovery.  The patient regained his breath and the final images  are pending at time of this dictation.   [name], P.A. [facility]                  Thomas C. Wall, M.D. [facility]   DR/MEDQ  D:  03/24/2002  T:  03/24/2002  Job:  [id number]  06/01/2001:  EXERCISE STRESS TEST  CHART NUMBER:  [id number]  INDICATIONS:  Eric. Hulgan is a 70 year old male with known coronary artery disease, notable for history of non-Q-wave MI/stent ramus intermedius, treated with cutting balloon, PTCA/brachytherapy this past April secondary to in-stent restenosis and who, more recently, underwent cutting balloon angioplasty secondary to 95% peri-stent restenosis.  TEST:  The patient exercised a total of 12 minutes and 15 seconds on standard Bruce protocol.  The test was discontinued secondary to fatigue.  The patient reported no chest discomfort throughout the procedure.  Baseline heart rate 54 to maximum 152 (94% of PMHR; 12.9 METs).  Blood pressure rose from 114/74 baseline to 210/90 maximum, and a final reading of 168/80.  Serial EKG tracings revealed no significant ST abnormalities.  No  dysrhythmias noted.  CONCLUSION:  Negative adequate treadmill test; Cardiolite images pending. Attending Physician:  Cain Sieve DD:  05/29/01   Current Medications (verified): 1)  Cialis 5 Mg Tabs (Tadalafil) .... Take 1 Tablet Prior To Sexual Activity 2)  Lipitor 20 Mg Tabs (Atorvastatin Calcium) .... Take 1 Tablet By Mouth 3)  Diltiazem Hcl Er Beads 240 Mg Xr24h-Cap (Diltiazem Hcl Er Beads) .... Take 1 Tablet By Mouth Once Daily 4)  Altace 10 Mg Caps (Ramipril) .... Take 1 Tablet By Mouth Once Daily 5)  Aspir-Trin 325 Mg Tbec (Aspirin) .... Take 1 Tab Daily 6)  Flomax 0.4 Mg Caps (Tamsulosin Hcl) .... Take 1 Tab Daily 7)  Fish Oil 1000 Mg Caps (Omega-3 Fatty Acids) .... Take 1 Cap Daily 8)  Daily Multi  Tabs (Multiple Vitamins-Minerals) .... Take 1 Tab Daily 9)  Diltiazem Hcl 60 Mg Tabs (Diltiazem Hcl) .... Take 1 Tablet As Needed For Palpitations  Allergies (verified): No Known Drug Allergies  Past History:  Past Medical History: Last updated: 15-Aug-2009 Current Problems:  DYSLIPIDEMIA (ICD-272.4) HYPERTENSION (ICD-401.9) CAD (ICD-414.00) CHEST PAIN (ICD-786.50) SUPRAVENTRICULAR TACHYCARDIA (ICD-427.89)  Past Surgical History: Last updated: 15-Aug-2009 cath with stenting   Family History: Last updated: 08-15-2009 Father:deceased cause unknown Mother:deceased cause unknown  Social History: Last updated: 2009/08/15 Retired  Widowed  Tobacco Use - No.  Alcohol Use - no Regular Exercise - no Drug Use - no  Risk Factors: Exercise: no (2009-08-15)  Risk Factors: Smoking Status: never (2009/08/15)  Review of Systems       negative other than history of present illness  Vital Signs:  Patient profile:   70 year old male Weight:      181 pounds BMI:     27.62 Pulse rate:   58 / minute BP sitting:   133 / 71  (right arm)  Vitals Entered By: Dreama Saa, CNA (December 28, 2009 11:14 AM)  Physical Exam  General:  no acute distress, he is  clearly lost weight. Head:  normocephalic and atraumatic Eyes:  PERRLA/EOM intact; conjunctiva and lids normal. Neck:  Neck supple, no  JVD. No masses, thyromegaly or abnormal cervical nodes. Chest Wall:  no deformities or breast masses noted Lungs:  Clear bilaterally to auscultation and percussion. Heart:  PMI nondisplaced, regular rate and rhythm, no murmur rub or gallop, carotid upstrokes equal bilaterally without bruits Abdomen:  Bowel sounds positive; abdomen soft and non-tender without masses, organomegaly, or hernias noted. No hepatosplenomegaly. Msk:  Back normal, normal gait. Muscle strength and tone normal. Pulses:  pulses normal in all 4 extremities Extremities:  No clubbing or cyanosis. Neurologic:  Alert and oriented x 3. Skin:  Intact without lesions or rashes. Psych:  Normal affect.   Problems:  Medical Problems Added: 1)  Dx of Pericardial Effusion  (ICD-423.9) 2)  Dx of Syncope and Collapse  (ICD-780.2)  EKG  Procedure date:  12/28/2009  Findings:      normal sinus rhythm, left axis deviation, old inferior wall infarct, no change.  Impression & Recommendations:  Problem # 1:  SUPRAVENTRICULAR TACHYCARDIA (ICD-427.89) Assessment Deteriorated He is having this about every 6 months. He has not had to come the emergency room. He is on maintenance diltiazem. I will give him short acting 60 mg diltiazem to take when he has another event. His updated medication list for this problem includes:    Diltiazem Hcl Er Beads 240 Mg Xr24h-cap (Diltiazem hcl er beads) .[name]... Take 1 tablet by mouth once daily    Altace 10 Mg Caps (Ramipril) .[name]... Take 1 tablet by mouth once daily    Aspir-trin 325 Mg Tbec (Aspirin) .[name]... Take 1 tab daily    Diltiazem Hcl 60 Mg Tabs (Diltiazem hcl) .[name]... Take 1 tablet as needed for palpitations  Orders: EKG w/ Interpretation (93000)  Problem # 2:  HYPERTENSION (ICD-401.9) Assessment: Improved  The following medications were removed from the  medication list:    Hydrochlorothiazide 25 Mg Tabs (Hydrochlorothiazide) .[name]... Take 1 tab daily His updated medication list for this problem includes:    Diltiazem Hcl Er Beads 240 Mg Xr24h-cap (Diltiazem hcl er beads) .[name]... Take 1 tablet by mouth once daily    Altace 10 Mg Caps (Ramipril) .[name]... Take 1 tablet by mouth once daily    Aspir-trin 325 Mg Tbec (Aspirin) .[name]... Take 1 tab daily    Diltiazem Hcl 60 Mg Tabs (Diltiazem hcl) .[name]... Take 1 tablet as needed for palpitations  The following medications were removed from the medication list:    Hydrochlorothiazide 25 Mg Tabs (Hydrochlorothiazide) .[name]... Take 1 tab daily His updated medication list for this problem includes:    Diltiazem Hcl Er Beads 240 Mg Xr24h-cap (Diltiazem hcl er beads) .[name]... Take 1 tablet by mouth once daily    Altace 10 Mg Caps (Ramipril) .[name]... Take 1 tablet by mouth once daily    Aspir-trin 325 Mg Tbec (Aspirin) .[name]... Take 1 tab daily    Diltiazem Hcl 60 Mg Tabs (Diltiazem hcl) .[name]... Take 1 tablet as needed for palpitations  Problem # 3:  CAD (ICD-414.00) It has been over 2 years since he has had objective assessment of his coronary disease. He has some baseline dyspnea which could be an ischemic equivalent. Recent syncope we'll also rule out any significant coronary ischemia though I think this is unlikely. We'll obtain a exercise stress Myoview. His updated medication list for this problem includes:    Diltiazem Hcl Er Beads 240 Mg Xr24h-cap (Diltiazem hcl er beads) .[name]... Take 1 tablet by mouth once daily    Altace 10 Mg Caps (Ramipril) .[name]... Take 1 tablet by mouth once  daily    Aspir-trin 325 Mg Tbec (Aspirin) .[name]... Take 1 tab daily    Diltiazem Hcl 60 Mg Tabs (Diltiazem hcl) .[name]... Take 1 tablet as needed for palpitations  His updated medication list for this problem includes:    Diltiazem Hcl Er Beads 240 Mg Xr24h-cap (Diltiazem hcl er beads) .[name]... Take 1 tablet by mouth once daily    Altace 10 Mg Caps (Ramipril)  .[name]... Take 1 tablet by mouth once daily    Aspir-trin 325 Mg Tbec (Aspirin) .[name]... Take 1 tab daily    Diltiazem Hcl 60 Mg Tabs (Diltiazem hcl) .[name]... Take 1 tablet as needed for palpitations  Problem # 4:  DYSLIPIDEMIA (ICD-272.4) I have discontinued Vytorin and started Lipitor 20 mg per day. Followup blood work in 6 weeks. His updated medication list for this problem includes:    Lipitor 20 Mg Tabs (Atorvastatin calcium) .[name]... Take 1 tablet by mouth  Future Orders: T-Hepatic Function [id number]) ... 02/08/2010 T-Lipid Profile [id number]) ... 02/08/2010  Problem # 5:  SYNCOPE AND COLLAPSE (ICD-780.2) I feel in retrospect this was vasovagal. I've told him at length about how to avoid this in the future. No changes in medication. His updated medication list for this problem includes:    Diltiazem Hcl Er Beads 240 Mg Xr24h-cap (Diltiazem hcl er beads) .[name]... Take 1 tablet by mouth once daily    Altace 10 Mg Caps (Ramipril) .[name]... Take 1 tablet by mouth once daily    Aspir-trin 325 Mg Tbec (Aspirin) .[name]... Take 1 tab daily    Diltiazem Hcl 60 Mg Tabs (Diltiazem hcl) .[name]... Take 1 tablet as needed for palpitations  Problem # 6:  PERICARDIAL EFFUSION (ICD-423.9) Assessment: New  this That is asymptomatic but was said to be moderate on the CT scan. Will obtain 2-D echocardiogram for followup. Hopefully a small resolve. No intervention necessary. The following medications were removed from the medication list:    Hydrochlorothiazide 25 Mg Tabs (Hydrochlorothiazide) .[name]... Take 1 tab daily His updated medication list for this problem includes:    Altace 10 Mg Caps (Ramipril) .[name]... Take 1 tablet by mouth once daily  Orders: Nuclear Stress Test (Nuc Stress Test) 2-D Echocardiogram (2D Echo)  Orders: Nuclear Stress Test (Nuc Stress Test) 2-D Echocardiogram (2D Echo)  Patient Instructions: 1)  Your physician recommends that you schedule a follow-up appointment in: 3 months 2)  Your physician  recommends that you return for lab work in: 6 weeks 3)  Your physician has recommended you make the following change in your medication: Stop taking Vytorin and start taking Lipitor 20mg  by mouth at bedtime and Diltiazem 60mg  has been added to your medication to be taken as needed for tachycardia 4)  Your physician has requested that you have an echocardiogram.  Echocardiography is a painless test that uses sound waves to create images of your heart. It provides your doctor with information about the size and shape of your heart and how well your heart's chambers and valves are working.  This procedure takes approximately one hour. There are no restrictions for this procedure. 5)  Your physician has requested that you have an exercise stress myoview.  For further information please visit [email or web address]  Please follow instruction sheet, as given. Prescriptions: CIALIS 5 MG TABS (TADALAFIL) take 1 tablet prior to sexual activity  #30 x 3   Entered by:   Larita Fife Via LPN   Authorized by:   Gaylord Shih, MD, [facility]   Signed by:   Larita Fife  Via LPN on 70/26/3785   Method used:   Electronically to        [facility]* (retail)       [address]       [place], [place]  [address]       Ph: [phone]       Fax: [phone]   RxID:   [id number] LIPITOR 20 MG TABS (ATORVASTATIN CALCIUM) take 1 tablet by mouth  #30 x 3   Entered by:   Larita Fife Via LPN   Authorized by:   Gaylord Shih, MD, [facility]   Signed by:   Larita Fife Via LPN on 54/65/6812   Method used:   Electronically to        [facility]* (retail)       [address]       [place], [place]  [address]       Ph: [phone]       Fax: [phone]   RxID:   [id number] DILTIAZEM HCL 60 MG TABS (DILTIAZEM HCL) take 1 tablet as needed for palpitations  #30 x 1   Entered by:   Larita Fife Via LPN   Authorized by:   Gaylord Shih, MD, [facility]   Signed by:   Larita Fife Via LPN on 92/33/0076   Method used:   Electronically  to        [facility]* (retail)       [address]       [place], [place]  [address]       Ph: [phone]       Fax: [phone]   RxID:   [id number]

## 2010-06-13 NOTE — Letter (Signed)
Summary: [place] Results Letter  [facility] [place]  [address]   [place], [place] [address]   Phone: [phone]  Fax: [phone]      September 05, 2009 MRN: [id number]   Eric Beltran [address] [place], [place]  [address]   Dear Mr. Hendler,  Your test ordered by [facility] has been reviewed by your physician (or physician assistant) and was found to be normal or stable. Your physician (or physician assistant) felt no changes were needed at this time.  ____ Echocardiogram  ____ Cardiac Stress Test  __X__ Lab Work  ____ Peripheral vascular study of arms, legs or neck  ____ CT scan or X-ray  ____ Lung or Breathing test  ____ Other: Please continue on current medical treatment.  Thank you.  Lashawndra Lampkins, MD, F.A.C.C     

## 2010-06-13 NOTE — Letter (Signed)
Summary: [place] Results Engineer, agricultural at [facility]  [address] [place] [address]   Phone: [phone]  Fax: [phone]      January 12, 2010 MRN: [id number]   DIEGO ULBRICHT [address] [place], [place]  [address]   Dear Mr. Abdo,  Your test ordered by Selena Batten has been reviewed by your physician (or physician assistant) and was found to be normal or stable. Your physician (or physician assistant) felt no changes were needed at this time.  ____ Echocardiogram  __X__ Cardiac Stress Test  ____ Lab Work  ____ Peripheral vascular study of arms, legs or neck  ____ CT scan or X-ray  ____ Lung or Breathing test  ____ Other:  Please continue on current medical treatment.   Thank you.   Valera Castle, MD, F.A.C.C

## 2010-06-13 NOTE — Letter (Signed)
Summary: [place] Results Engineer, agricultural at [facility]  [address] [place] [address]   Phone: [phone]  Fax: [phone]      January 04, 2010 MRN: [id number]   Eric Beltran [address] [place], [place]  [address]   Dear [name],  Your test ordered by Selena Batten has been reviewed by your physician (or physician assistant) and was found to be normal or stable. Your physician (or physician assistant) felt no changes were needed at this time.  __X__ Echocardiogram  ____ Cardiac Stress Test  ____ Lab Work  ____ Peripheral vascular study of arms, legs or neck  ____ CT scan or X-ray  ____ Lung or Breathing test  ____ Other: Please continue on current medical treatment.  Thank you.   Valera Castle, MD, F.A.C.C

## 2010-06-15 ENCOUNTER — Telehealth (INDEPENDENT_AMBULATORY_CARE_PROVIDER_SITE_OTHER): Payer: Self-pay | Admitting: *Deleted

## 2010-06-19 ENCOUNTER — Telehealth: Payer: Self-pay | Admitting: Cardiology

## 2010-06-20 ENCOUNTER — Encounter: Payer: Self-pay | Admitting: Cardiology

## 2010-06-21 NOTE — Progress Notes (Signed)
  Phone Note Other Incoming   Request: Send information Summary of Call: Request for records received from [facility]. Request forwarded to [facility].  past 3 yrs.

## 2010-06-29 NOTE — Letter (Signed)
Summary:  Future Lab Work Engineer, agricultural at [facility]  [address] [place] [address]   Phone: [phone]  Fax: [phone]     June 20, 2010 MRN: [id number]   Eric Beltran [address] [place], [place]  [address]      YOUR LAB WORK IS DUE  July 17, 2010 _________________________________________  Please go to [facility], located across the street from [facility] on the second floor.  Hours are Monday - Friday 7am until 7:30pm         Saturday 8am until 12noon    _X_  DO NOT EAT OR DRINK AFTER MIDNIGHT EVENING PRIOR TO LABWORK  __ YOUR LABWORK IS NOT FASTING --YOU MAY EAT PRIOR TO LABWORK

## 2010-06-29 NOTE — Progress Notes (Signed)
Summary: WANT TO SEE ABOUT NEW MED INPLACE OF LIPITOR   Phone Note Call from Patient Call back at Home Phone [phone]   Caller: PT Reason for Call: Refill Medication, Talk to Nurse Summary of Call: PT HAS STOPED TAKING LIPITOR AND WOULD LIKE ANOTHER RX CALLED IN FOR HIM TO [facility] Initial call taken by: Raechel Ache [facility],  June 19, 2010 4:21 PM  Follow-up for Phone Call        S: Pt. wants to try another cholesterol medication.  B: On last OV with Dr. Daleen Squibb on 04-13-10 pt. was advised to stop taking Lipitor for 4 weeks to see if his arthralgias resolve and if they did  we will try Vytorin. A: Pt. states he has no complaints at this time. Denies cramping. R: Will call pt. back with Dr. Vern Claude recommendations.        Follow-up by: Larita Fife Via LPN,  June 19, 2010 4:57 PM  Additional Follow-up for Phone Call Additional follow up Details #1::        He can take 10/10 of Vytorin. Followup labs and office visit with me in March in [place]. Additional Follow-up by: Gaylord Shih, MD, [facility],  June 20, 2010 9:12 AM     Appended Document: WANT TO SEE ABOUT NEW MED INPLACE OF LIPITOR Medications Added VYTORIN 10-10 MG TABS (EZETIMIBE-SIMVASTATIN) take 1 tablet by mouth at bedtime          Phone Note Outgoing Call   Summary of Call: [facility]  Initial call taken by: Larita Fife Via LPN,  June 20, 2010 10:03 AM  Follow-up for Phone Call        Pt. advised, he states he understands instructions given. RX faxed to [facility], appt. scheduled for 07-26-10 with Dr. Daleen Squibb and lab orders will be mailed to pt. in 5 weeks. Follow-up by: Larita Fife Via LPN,  June 20, 2010 11:27 AM    New/Updated Medications: VYTORIN 10-10 MG TABS (EZETIMIBE-SIMVASTATIN) take 1 tablet by mouth at bedtime Prescriptions: VYTORIN 10-10 MG TABS (EZETIMIBE-SIMVASTATIN) take 1 tablet by mouth at bedtime  #30 x 3   Entered by:   Larita Fife Via LPN   Authorized by:   Gaylord Shih, MD, [facility]   Signed by:    Larita Fife Via LPN on 09/81/1914   Method used:   Electronically to        [facility]* (retail)       [address]       [place], [place]  [address]       Ph: [phone]       Fax: [phone]   RxID:   [id number]

## 2010-07-04 ENCOUNTER — Emergency Department (HOSPITAL_COMMUNITY)
Admission: EM | Admit: 2010-07-04 | Discharge: 2010-07-05 | Disposition: A | Discharge status: Home / Self Care | Payer: Medicare Other | Attending: Emergency Medicine | Admitting: Emergency Medicine

## 2010-07-04 ENCOUNTER — Emergency Department (HOSPITAL_COMMUNITY): Payer: Medicare Other

## 2010-07-04 DIAGNOSIS — Z79899 Other long term (current) drug therapy: Secondary | ICD-10-CM | POA: Insufficient documentation

## 2010-07-04 DIAGNOSIS — J189 Pneumonia, unspecified organism: Secondary | ICD-10-CM | POA: Insufficient documentation

## 2010-07-04 DIAGNOSIS — I10 Essential (primary) hypertension: Secondary | ICD-10-CM | POA: Insufficient documentation

## 2010-07-04 DIAGNOSIS — I252 Old myocardial infarction: Secondary | ICD-10-CM | POA: Insufficient documentation

## 2010-07-04 DIAGNOSIS — I498 Other specified cardiac arrhythmias: Secondary | ICD-10-CM | POA: Insufficient documentation

## 2010-07-04 DIAGNOSIS — E785 Hyperlipidemia, unspecified: Secondary | ICD-10-CM | POA: Insufficient documentation

## 2010-07-04 DIAGNOSIS — R509 Fever, unspecified: Secondary | ICD-10-CM | POA: Insufficient documentation

## 2010-07-04 LAB — URINALYSIS, ROUTINE W REFLEX MICROSCOPIC
Bilirubin Urine: NEGATIVE
Specific Gravity, Urine: 1.01 (ref 1.005–1.030)
Urine Glucose, Fasting: NEGATIVE mg/dL
Urobilinogen, UA: 0.2 mg/dL (ref 0.0–1.0)
pH: 7 (ref 5.0–8.0)

## 2010-07-04 LAB — URINE MICROSCOPIC-ADD ON

## 2010-07-05 NOTE — Consult Note (Signed)
Summary: [facility]: New Pt Consultation  [facility]: New Pt Consultation   Imported By: Earl Many 06/26/2010 17:45:23  _____________________________________________________________________  External Attachment:    Type:   Image     Comment:   External Document

## 2010-07-06 LAB — URINE CULTURE: Culture: NO GROWTH

## 2010-07-17 ENCOUNTER — Telehealth: Payer: Self-pay | Admitting: Cardiology

## 2010-07-18 ENCOUNTER — Ambulatory Visit (HOSPITAL_COMMUNITY)
Admission: RE | Admit: 2010-07-18 | Discharge: 2010-07-18 | Disposition: A | Discharge status: Home / Self Care | Payer: Medicare Other | Source: Ambulatory Visit | Attending: Pulmonary Disease | Admitting: Pulmonary Disease

## 2010-07-18 ENCOUNTER — Other Ambulatory Visit (HOSPITAL_COMMUNITY): Payer: Self-pay | Admitting: Pulmonary Disease

## 2010-07-18 DIAGNOSIS — J189 Pneumonia, unspecified organism: Secondary | ICD-10-CM

## 2010-07-25 NOTE — Progress Notes (Signed)
Summary: Vytorin   Phone Note Call from Patient   Caller: Patient Details for Reason: Vytorin Summary of Call: S: Pt. c/o cost of Vytorin. B: On last OV with Dr. Daleen Squibb on 04-13-10 pt. c/o arthralgias while taking Lipitor. Pt. advised to stop taking Lipitor for 4 weeks to see if s/s went away and they did so pt. was started on Vytorin 10/10mg  at bedtime. A: Pt. states that he went to pick up Vytorin but it cost so much that he could not afford it. He wants to know if there is another medication he can take that is not as expensive.  R: Pt. advised that we will contact him with Dr. Vern Claude recommendations. Initial call taken by: Larita Fife Via LPN,  July 17, 2010 4:33 PM  Follow-up for Phone Call        try 40mg  of pravastatin, check labs in 6 weeks Follow-up by: Gaylord Shih, MD, [facility],  July 17, 2010 7:28 PM     Appended Document: Vytorin Medications Added PRAVACHOL 40 MG TABS (PRAVASTATIN SODIUM) take 1 tablet by mouth at bedtime          Phone Note Outgoing Call   Call placed by: Larita Fife Via LPN,  July 18, 2010 10:09 AM Details for Reason: Vytorin  Summary of Call: Pt. advised to stop taking Vytorin and replace with Pravastatin 40mg  by mouth at bedtime. RX faxed to [facility]. Initial call taken by: Larita Fife Via LPN,  July 18, 2010 10:10 AM    New/Updated Medications: PRAVACHOL 40 MG TABS (PRAVASTATIN SODIUM) take 1 tablet by mouth at bedtime

## 2010-07-26 ENCOUNTER — Encounter (INDEPENDENT_AMBULATORY_CARE_PROVIDER_SITE_OTHER): Payer: Self-pay | Admitting: *Deleted

## 2010-07-26 ENCOUNTER — Ambulatory Visit: Payer: Self-pay | Admitting: Cardiology

## 2010-08-01 NOTE — Letter (Signed)
Summary: Appointment - Missed  [facility] [place]  [address] [place] [address]   Phone: [phone]  Fax: [phone]     July 26, 2010 MRN: [id number]   Eric Beltran [address] [place], [place]  [address]   Dear [name],  Our records indicate you missed your appointment on           07/26/10             with Dr.       .             Daleen Squibb                       It is very important that we reach you to reschedule this appointment. We look forward to participating in your health care needs. Please contact us at the number listed above at your earliest convenience to reschedule this appointment.     Sincerely,    Glass blower/designer

## 2010-08-30 ENCOUNTER — Other Ambulatory Visit: Payer: Self-pay | Admitting: Cardiology

## 2010-08-31 ENCOUNTER — Other Ambulatory Visit: Payer: Self-pay | Admitting: *Deleted

## 2010-08-31 ENCOUNTER — Other Ambulatory Visit: Payer: Self-pay | Admitting: Cardiology

## 2010-08-31 MED ORDER — RAMIPRIL 10 MG PO CAPS: 10.0000 mg | ORAL_CAPSULE | Freq: Every day | ORAL | Status: DC

## 2010-08-31 NOTE — Telephone Encounter (Signed)
Needs refill on Ramipril called to [facility] / tg

## 2010-09-13 ENCOUNTER — Other Ambulatory Visit: Payer: Self-pay | Admitting: Surgery

## 2010-09-13 DIAGNOSIS — I712 Thoracic aortic aneurysm, without rupture, unspecified: Secondary | ICD-10-CM

## 2010-09-26 NOTE — Letter (Signed)
March 02, 2008    Ramon Dredge L. Juanetta Gosling, MD  [address]  [place], [place] [address]   RE:  RODRICK, PAYSON  MRN:  [id number]  /  DOB:  [birth year]   Dear Renae Fickle,   Mr. Geisen returns to the office for continued assessment and treatment  of coronary artery disease and cardiovascular risk factors.  Since I  last saw him nearly a year ago, he has done generally well.  He reports  intermittent dyspnea with exertion, most typically during sexual  activity.  He has had no chest discomfort.  He leads a fairly sedentary  lifestyle.  He is managed to avoid weight gain.  He checks his blood  pressure on daily basis with systolics typically approximately 130-140  and diastolics approximately 70.  He has chronic oral herpes for which  he uses acyclovir.  He has had an elevated PSA that has been attributed  to chronic prostatitis after a negative biopsy by Dr. Vonita Moss.   CURRENT MEDICATIONS:  1. Ramipril 10 mg daily.  2. Aspirin 81 mg daily.  3. Flomax 0.4 mg b.i.d.  4. Acyclovir 400 mg daily.  5. Vytorin 10/20 mg daily.  6. Diltiazem 240 mg daily.  The patient notes some bradycardia, which      he attributes to diltiazem.   PHYSICAL EXAMINATION:  GENERAL:  A pleasant, proportionate gentleman in  no acute distress.  VITAL SIGNS:  The weight is 190, 2 pounds less than last year.  Blood  pressure 130/70, heart rate 65 and regular, respirations 14.  NECK:  No jugular venous distention; no carotid bruits.  LUNGS:  Clear.  CARDIAC:  Normal first and second heart sounds; fourth heart sound  present.  ABDOMEN:  Soft and nontender; no organomegaly.  EXTREMITIES:  No edema; distal pulses intact.   LABORATORY DATA:  Laboratory from 8 months ago was good with excellent  lipid values, a normal chemistry profile, normal LFTs and a PSA of  11.58.   IMPRESSION:  Mr. Gatliff has remained stable since multiple interventions  for coronary artery disease in the early 2000.  His most recent stent  was placed in  2004.  My hope is that he will have no further problems  with coronary artery disease.  He will continue his current medications,  return for repeat laboratory studies in 6 months and see me again in 1  year.  A 6 minute walk test was performed.  Mr. Moyers heart rate prior  to exercise was 61.  He covered a 1100 feet without significant  symptoms.  After exercise, the heart rate had increased only to 70 beats  per minute indicating a degree of chronotropic incompetence.    Sincerely,      Gerrit Friends. Dietrich Pates, MD, [facility]  Electronically Signed    RMR/MedQ  DD: 03/02/2008  DT: 03/03/2008  Job #: [id number]

## 2010-09-26 NOTE — Assessment & Plan Note (Signed)
[facility]                       [address] CARDIOLOGY OFFICE NOTE   GEFFREY, MICHAELSEN                      MRN:          [id number]  DATE:07/28/2008                            DOB:          06/30/1940    HISTORY OF PRESENT ILLNESS:  Ms. Eric Beltran comes in today for followup.  He  saw Dr. Dietrich Pates last in April 2008.   He is having no angina or ischemic symptoms.  He does have some mild  dyspnea on exertion.   I do not see whether he has had a functional study since his last  intervention was in 2004.   PROBLEM LIST:  History of non-ST segment elevation myocardial infarction  in November 2001.  He had stenting of the ramus intermedius.  He was  subsequent found to have a 90% in-stent restenosis in April 2002, which  required brachytherapy.  He has also had a cutting balloon PTCA of  recurrent ramus intermedius restenosis in February 2003, and  subsequently had restenosis once again and had TAXUS stent placed in the  ostium of the ramus intermedius branch of the circumflex as well as the  ostium of AV circumflex artery with V stenting.  This was in April 2004.   He has also been plagued by problems with his prostate.   He has a history of hypertension and hyperlipidemia.   MEDICATIONS:  He is currently on  1. Altace 10 mg per day.  2. Multivitamin daily.  3. Aspirin 81 mg per day.  4. Flomax 0.4 b.i.d.  5. Acyclovir 400 mg daily.  6. Vytorin 10/20 daily.  7. Diltiazem 240 mg a day.  8. Fish oil 1 p.o. b.i.d.  9. He does not carry sublingual nitroglycerin.   ALLERGIES:  He is intolerant of NORVASC and ROSUVASTATIN, which caused  myalgias.   PHYSICAL EXAMINATION:  VITAL SIGNS:  Blood pressure today 156/84.  His  pulse is 70 and regular.  His weight is 189.  His pressure is usually  better than this.  HEENT:  Normal.  NECK:  Carotids upstrokes are equal bilaterally without bruits.  No JVD.  Thyroid is not enlarged.  Trachea is midline.  LUNGS:  Clear to auscultation and percussion.  HEART:  Reveals a nondisplaced PMI.  Normal S1 and S2.  No murmur, rub,  or gallop.  ABDOMEN:  Soft, good bowel sounds.  No midline bruit.  EXTREMITIES:  No cyanosis, clubbing, or edema.  Pulses are intact.   LABORATORY DATA:  Showed a normal CMP.  Cholesterol 176, triglycerides  178, HDL 40, LDL of 100, total cholesterol HDL ratio of 4.4.  He states  he is taking his Vytorin every day.   ASSESSMENT AND PLAN:  Ms. Rallis is doing well.  His dyspnea on exertion  could be an anginal equivalent.  It has been a number of years, since he  has had objective assessment of his coronary artery disease with  multiple restenosis and interventions.  His lipids are not at goal,  though they have been the past on 10/20 of Vytorin.   PLAN:  1. Continue current medications.  2. Watch  blood pressure.  3. Exercise rest/stress Myoview.   He has no ischemia and his blood pressures are good.  I will see him  back in a year.     Thomas C. Daleen Squibb, MD, [facility]  Electronically Signed    TCW/MedQ  DD: 07/28/2008  DT: 07/29/2008  Job #: [id number]   cc:   Ramon Dredge L. Juanetta Gosling, M.D.

## 2010-09-26 NOTE — Letter (Signed)
April 22, 2007    Ramon Dredge L. Juanetta Gosling, M.D.  [address]  [place], [place] [address]   RE:  AWAD, GLADD [facility])  MRN:  [id number]  /  DOB:  [birth year]   Dear Renae FickleLucien Mons returns to the office for continuing assessment of coronary disease  and cardiovascular risk factors.  He was due to be seen in December, but  came a few weeks early as a result of blood pressure out of control.  He  has had no symptoms.  He does describe occasional, intermittent, mild  chest fullness unrelated to exertion.  He has had some questionable  dyspnea with exertion.  Generally, he has felt well and remained active.  He is not performing much aerobic exercise.  Weight has been stable.   He has substituted red yeast rice for Vytorin.  His other medications  include Altace 10 mg daily, aspirin 81 mg daily, Flomax 0.4 mg b.i.d.,  acyclovir 400 mg b.i.d., and Imdur 15 mg p.r.n.   PHYSICAL EXAMINATION:  A pleasant gentleman in no acute distress.  The weight is 192, 1 pound more than at his last visit.  Blood pressure  160/95, heart rate 75 and regular, respirations 18.  NECK:  No jugular venous distension, normal carotid upstrokes without  bruits.  LUNGS:  Clear.  CARDIAC:  Normal 1st and 2nd heart sounds, 4th heart sound present.  HEENT:  Fundi nonvisualized.  ABDOMEN:  Soft and nontender, no masses, no organomegaly.  EXTREMITIES:  Bounding distal pulses, no edema.   IMPRESSION:  Mr. Slabach blood pressure is suboptimally controlled.  He  also has had continuing palpitations from paroxysmal supraventricular  tachycardia.  We will add Diltiazem 240 mg daily to his regimen.  I  explained that red yeast rice is not as potent as Vytorin, which will be  resumed.  We will check a chemistry profile and lipid profile in 1  month.  WL will keep track of blood pressures at home.  I will see this  nice gentleman again in 6 months.    Sincerely,      Gerrit Friends. Dietrich Pates, MD, [facility]  Electronically Signed    RMR/MedQ  DD: 04/22/2007  DT: 04/23/2007  Job #: [id number]

## 2010-09-26 NOTE — Assessment & Plan Note (Signed)
[facility]                       [place] CARDIOLOGY OFFICE NOTE   Eric Beltran, REEVER                      MRN:          [id number]  DATE:08/27/2008                            DOB:          January 04, 1941    [name] comes in today for followup of his coronary artery disease and  mixed hyperlipidemia.   His stress Myoview showed relatively good exercise tolerance,  hypertensive blood pressure response peaking at 240/100, and a small  area of inferior lateral redistribution.  His EF was 55%.   His lipids showed a cholesterol increase from 124-176.  His  triglycerides have gone from 128-178.  His HDL was 40.  His LDL had gone  from 55-100.  LFTs and chemistry were normal.   He is having no symptoms of coronary ischemia.  There were no ST-segment  changes on his cardiogram.  His ramus intermedius has been intervened  upon 4 different times and may be the culprit for this small area.  Looking back at his previous stress test in 2004, this actually shows  improvement from just being a fixed defect.   When questioned today, he is not taking his Vytorin every day.  It is  probably the reason for the worsening of his lipid panel.   PLAN:  1. Take Vytorin daily.  2. Renew sublingual nitroglycerin.  3. I have reviewed the symptoms of angina or ischemia and how to      respond.  4. Follow up with me in 6 months.   He also asked if he could use Viagra.  I told him it was safe with his  blood pressure under good control and not having any angina.  We have  written a prescription for that for 10 with couple of refills.     Thomas C. Daleen Squibb, MD, [facility]  Electronically Signed    TCW/MedQ  DD: 08/27/2008  DT: 08/28/2008  Job #: [id number]

## 2010-09-26 NOTE — Consult Note (Signed)
NEW PATIENT CONSULTATION   Eric Beltran, Eric Beltran  DOB:  February 22, 1941                                        April 25, 2010  CHART #:  [id number]   REASON FOR CONSULTATION:  A 5.1-cm ascending aortic aneurysm.   CLINICAL HISTORY:  I was asked by Dr. Daleen Squibb to evaluate the patient for  an ascending aortic aneurysm.  He is a 70 year old gentleman with a long  history of coronary disease dating back to around 2001.  He has had  myocardial infarction as well as multiple percutaneous interventions  with stents.  He has done well since stenting in 2004 and has continued  with periodic follow up.  He recently had a 2-D echocardiogram that  showed trivial pericardial effusion with normal left ventricular  function and mild mitral regurgitation.  Ejection fraction was 55%.  The  ascending aorta was noted to be dilated at 46 mm.  There was no aortic  insufficiency.  He also had a stress Myoview examination which showed  good exercise tolerance and no significant change in the minimal  inferior ischemia.  He subsequently had a CT scan of the chest performed  to evaluate his ascending aorta.  This showed a 5.1- x 4.6-cm fusiform  ascending thoracic aortic aneurysm.  There are mild atherosclerotic  changes in the aortic arch.  There are prominent coronary artery  calcifications.  There was a 1.1-cm calcification noted in the right  thyroid gland and a 6.5-mm low-density lesion in the left thyroid gland  that was followed up with thyroid ultrasound and felt to be  insignificant.   REVIEW OF SYSTEMS:  GENERAL:  He denies any fever or chills.  He has had  no weight loss.  He denies any fatigue.  EYES:  Negative.  ENT:  Negative.  ENDOCRINE:  He denies diabetes and hypothyroidism.  CARDIOVASCULAR:  He denies any chest pain or pressure.  He does have  some mild exertional dyspnea if he is walking upstairs caring something.  He denies any PND or orthopnea.  He has had no shortness of  breath with  normal activity.  He denies palpitation and peripheral edema.  RESPIRATORY:  He has had no cough or sputum production.  GI:  He has had no nausea or vomiting.  He denies melena and bright red  blood per rectum.  GU:  He denies dysuria and hematuria.  VASCULAR:  He denies claudication and phlebitis.  NEUROLOGICAL:  He denies any focal weakness or numbness.  He denies  dizziness and syncope.  He has never had a TIA or a stroke.  MUSCULOSKELETAL:  He denies arthralgias and myalgias.  PSYCHIATRIC:  Negative.  HEMATOLOGIC:  Negative.   ALLERGIES:  None.   MEDICATIONS:  1. Cialis 5 mg p.r.n.  2. Lipitor 20 mg daily.  3. Diltiazem ER 240 mg daily.  4. Altace 10 mg daily.  5. Aspirin 325 mg daily.  6. Flomax 0.4 mg daily.  7. Multivitamin daily.   PAST MEDICAL HISTORY:  Significant for hypertension and dyslipidemia.  He has a history of extensive coronary disease as noted above.  He  underwent stenting initially in 2001 as well as cutting balloon  angioplasty.  He subsequently underwent brachytherapy for restenosis and  eventually underwent placement of Taxus stents at the ostium of the  ramus branch of  the left circumflex artery as well as in the ostium of  the AV groove left circumflex.  His last procedure was performed in  2004.  He has a history of SVT in the past.  He is status post inguinal  hernia repair in the past and status post nasal surgery in the past.   SOCIAL HISTORY:  He is retired and widowed.  He is a nonsmoker and  drinks occasional alcohol.   FAMILY HISTORY:  His parents died of unknown causes.  There is no  history of aneurysmal disease in his family.   PHYSICAL EXAMINATION:  Blood pressure 127/77, pulse is 60 and regular,  respiratory rate is 16 and unlabored.  Oxygen saturation on room air is  96%.  He is a well-developed white male in no distress.  HEENT exam  shows him to be normocephalic and atraumatic.  Pupils are equal and  reactive to  light and accommodation.  Extraocular muscles are intact.  Oropharynx is clear.  Neck exam shows normal carotid pulses bilaterally.  There are no bruits.  There is no adenopathy or thyromegaly.  Cardiac  exam shows a regular rate and rhythm with normal S1 and S2.  There is no  murmur, rub, or gallop.  Lungs are clear.  Abdominal exam shows active  bowel sounds.  Abdomen is soft, mildly obese, and nontender.  There are  no palpable masses or organomegaly.  Extremity exam shows no peripheral  edema.  Pedal pulses are palpable bilaterally.  Skin is warm and dry.  Neurologic exam shows him to be alert and oriented x3.  Motor and  sensory exams are grossly normal.   IMPRESSION:  The patient has a 5.1-cm fusiform ascending aortic aneurysm  by CT scan.  This is asymptomatic and is unclear how long it has been  present.  I cannot find evidence of any other CT scans or MRIs being  done in the [facility] PACS system.  I did review his previous cardiac  catheterizations and an aortic root injection was never done.  He has a  history of extensive coronary disease but is currently asymptomatic and  has been for many years.  I would recommend continued follow up of his  aortic aneurysm.  I usually recommend surgical treatment if they would  reach 5.5 cm or become symptomatic.  He has history of extensive  coronary disease and I would think it could be at high risk for possibly  needing coronary bypass surgery at some point in the future.  If he  developed worsening coronary disease that required surgical treatment,  then his aneurysm would have to be addressed at the same time.  It is  possible this aneurysm may stay stable for many years.  I would  recommend the use of a beta-blocker to decrease the stress on his aorta  and tight blood pressure control.  I will plan to see him back in 6  months and we will do an MR angiogram to follow up on his aorta which  will decrease his radiation exposure but  still give [place] a very good  study.  I have reviewed all this with him, and he is in agreement with  that plan.   Evelene Croon, M.D.  Electronically Signed   BB/MEDQ  D:  04/25/2010  T:  04/26/2010  Job:  [id number]   cc:   Thomas C. Daleen Squibb, MD, [facility]  Edward L. Juanetta Gosling, M.D.

## 2010-09-29 ENCOUNTER — Other Ambulatory Visit: Payer: Self-pay | Admitting: Cardiology

## 2010-09-29 LAB — LIPID PANEL
HDL: 35 mg/dL — ABNORMAL LOW (ref >39)
LDL Cholesterol: 99 mg/dL (ref 0–99)
Total CHOL/HDL Ratio: 4.5 Ratio
Triglycerides: 127 mg/dL (ref <150)

## 2010-09-29 LAB — HEPATIC FUNCTION PANEL
AST: 12 U/L (ref 0–37)
Albumin: 4.5 g/dL (ref 3.5–5.2)
Alkaline Phosphatase: 77 U/L (ref 39–117)
Total Bilirubin: 0.5 mg/dL (ref 0.3–1.2)
Total Protein: 7.3 g/dL (ref 6.0–8.3)

## 2010-09-29 NOTE — Procedures (Signed)
   NAME:  Eric Beltran, Eric Beltran NO.:  [id number]   MEDICAL RECORD NO.:  [id number]                   PATIENT TYPE:   LOCATION:                                       FACILITY:   PHYSICIAN:  Thomas C. Wall, M.D. [facility]            DATE OF BIRTH:  1940/06/14   DATE OF PROCEDURE:  03/24/2002  DATE OF DISCHARGE:                                    STRESS TEST   BRIEF HISTORY:  The patient is a 70 year old male with a history of coronary  artery disease followed by Dr. Daleen Squibb.  He had a non Q-wave MI in November  2001.  He is status post PTCA and brachytherapy for instent restenosis of a  ramus vessel performed September 05, 2000.  The patient recently saw Dr. Daleen Squibb in  the office.  He was not having any significant symptoms.  However, he was  scheduled for an exercise Cardiolite to further evaluate for progression of  coronary artery disease.   Prior to the study the patient had no complaints of recent chest pain or  shortness of breath.  His baseline EKG showed sinus rhythm, rate 64 beats  per minute with poor R-wave progression, but no significant abnormalities.  Blood pressure was 128/86.  Target heart rate was 136.   The patient was able to exercise for a total of 12 minutes and 48 seconds  reaching a maximum heart rate of 165 beats per minute.  As noted, his target  heart rate was 136 beats per minute.  He was injected with the Cardiolite at  9 minutes 4 seconds into the study at which time his heart rate was 130.  His blood pressure did elevate to 220/110 with exercise.   The patient experienced no chest pain.  He did have some shortness of  breath.  There were no significant EKG changes.  The blood pressure returned  to 180/88 in recovery.  The patient regained his breath and the final images  are pending at time of this dictation.     [name], P.A. [facility]                  Thomas C. Daleen Squibb, M.D. [facility]    DR/MEDQ  D:  03/24/2002  T:  03/24/2002  Job:  [id number]   cc:   Ramon Dredge L. Juanetta Gosling, M.D.  [address]  [place]  [place] [address]  Fax: [phone]

## 2010-09-29 NOTE — Discharge Summary (Signed)
[name]  Patient:    Eric Beltran, Eric Beltran                     MRN: [id number] Adm. Date:  [id number] Disc. Date: [id number] Attending:  [name] Dictator:   Eric Beltran, P.A. CC:         Eric Beltran, M.D., Eric Beltran, [place]   Referring Physician Discharge [place]  DATE OF BIRTH:  02/03/1941  HISTORY ON ADMISSION:  [name] is a 70 year old male with known coronary artery disease.  He presented on Thanksgiving Day with an acute coronary syndrome and subsequent non-Q-wave MI.  He had a catheterization which revealed a high-grade large ramus off the circumflex that was subsequently stented and angioplastied.  There was also a 60% stenosis in the right coronary artery.  The patient had done well and participated in cardiac rehabilitation.  His blood pressure was a little difficult to manage.  He was seen in the office by Eric Beltran, M.D., on August 30, 2000, and reported exertional chest tightness similar to the symptoms he had experienced prior to his MI. Arrangements were made to admit the patient for further evaluation.  PAST MEDICAL HISTORY:  As noted, the patient had a previous non-Q-wave MI on Thanksgiving Day with subsequent PTCA and stenting of the ramus intermedius. He has a history of hypertension and a history of elevated lipids.  ALLERGIES:  The patient is intolerant to NORVASC.  MEDICATIONS PRIOR TO ADMISSION: 1. Aspirin 325 mg daily. 2. Altace 10 mg daily. 3. Hydrochlorothiazide 12.5 mg daily. 4. Nitroglycerin p.r.n., as well as Zocor 40 mg daily.  HOSPITAL COURSE:  As noted, this patient was admitted to [facility] on September 05, 2000, for further evaluation of anginal-type symptoms.  He underwent cardiac catheterization on September 05, 2000.  He was found to have a 95% stenosis of the ramus intermedius.  A percutaneous intervention was performed along with brachytherapy.  The lesion was reduced from  95% to less than 20%.  It was recommended that he stay on Plavix for at least six months following this procedure.  Arrangements were made to discharge the patient on September 06, 2000, in improved condition.  LABORATORY DATA:  An EKG performed on the day of discharge showed sinus bradycardia with Q waves in 3 and aVF consistent with an inferior MI.  The heart rate was 57 beats per minute.  A CBC on the day of discharge revealed hemoglobin 13.6, hematocrit 39.4, WBC 9900, and platelets 274,000.  Chemistries on the day of discharge revealed a BUN of 13, creatinine 1.0, potassium 3.7, and glucose mildly elevated at 123.  On admission, an INR was within normal limits.  A chest x-ray performed on September 03, 2000, revealed mild chronic bronchitic and interstitial changes, as well as cardiomegaly.  DISCHARGE MEDICATIONS: 1. Enteric-coated aspirin 325 mg daily. 2. Altace 10 mg daily. 3. Hydrochlorothiazide 12.5 mg daily. 4. Plavix 75 mg daily for six months. 5. Nitroglycerin p.r.n. for chest pain. 6. Zocor 40 mg daily.  ACTIVITY:  The patient was told to avoid any strenuous activity or driving for at least two days.  DIET:  He was to be on a low-salt, low-fat diet.  WOUND CARE:  He was told to call the office if he had any increased pain, swelling, or bleeding from his groin.  FOLLOW-UP:  He was to Beltran Eric Beltran, M.D., as needed for as scheduled, and Eric Beltran, M.D.,  in the [place], [place], office on Sep 25, 2000, at 11:30 a.m.  PROBLEM LIST AT THE TIME OF DISCHARGE: 1. Status post percutaneous intervention of the ramus intermedius performed on    September 05, 2000, at the previous stent site, reducing the lesion from 95% to    less than 20%.  Brachytherapy was also performed at this site. 2. History of non-Q-wave myocardial infarction with percutaneous transluminal    coronary angioplasty and stenting of the ramus intermedius performed on    Thanksgiving Day of  2001. 3. History of negative stress Cardiolite in January of 2002, at which time his    ejection fraction was estimated to be 52%. 4. History of hypertension. 5. History of elevated lipids which will need further follow-up.  The patient    has been on Zocor. 6. History of intolerance to Norvasc.DD:  09/06/00 TD:  09/06/00 Job: [id number]

## 2010-09-29 NOTE — Cardiovascular Report (Signed)
. [facility]  Patient:    Eric Beltran, Eric Beltran. Visit Number: [id number] MRN: [id number]          Service Type: Attending:  Everardo Beals. Juanda Chance, M.D. [facility] Dictated by:   Everardo Beals. Juanda Chance, M.D. [employer] Proc. Date: 05/12/01   CC:         [name], M.D.  Thomas C. Wall, M.D. [facility]   Cardiac Catheterization  CLINICAL HISTORY:  Mr. Butt is 70 years old and has documented coronary disease.  He had a stent placed in the intermedius branch of the circumflex artery in November of 2001 and developed restenosis and underwent cutting balloon angioplasty and brachytherapy on September 05, 2000.  He developed recurrent symptoms over the last couple of weeks and was brought in for evaluation angiography.  DESCRIPTION OF PROCEDURE:  The procedure was performed via the right femoral artery using an arterial sheath and 6-French preformed coronary catheters.  A front wall arterial puncture was performed.  Omnipaque contrast was used.  At the completion of the diagnostic study, we made a decision to proceed with intervention on the circumflex artery which had stenoses at both ends of the stent, the proximal of which was at the ostium of the intermedius branch. There was also a 40% ostial lesion in the circumflex artery and had concern about compromising this branch.  The patient was given weight-adjusted heparin to prolong an ACT of greater than 200 seconds and was given double bolus Integrilin and infusion.  We used a 4.0 Voda, a 7-French guiding catheter with sideholes, and a short luge wire. We crossed the lesion with the wire without difficulty.  We first dilated the ostial lesion with a 2.75- x 10-mm cutting balloon performing one inflation of eight atmospheres for 15 seconds.  We only did a short balloon inflation because the balloon occluded the ostium of the circumflex artery.  We then dilated the lesion at the distal edge of the stent and performed three inflations  up to eight atmospheres for 45 seconds each.  We then performed one last inflation at the ostial lesion with one inflation of four atmospheres for 15 seconds.  We were concerned about compromising the AV circumflex and did not do any more prolonged inflations.  Repeat diagnostic studies were then performed through the guiding catheter.  RESULTS: 1. The left main coronary artery:  The left main coronary artery was free of    significant disease. 2. The left anterior descending artery:  The left anterior descending artery    gave rise to two diagonal branches and a septal perforator.  There was    60-70% stenosis before the first diagonal branch.  There was 70% narrowing    at the ostium of the first diagonal branch. 3. The circumflex artery:  The circumflex artery gave rise to an intermedius    branch and an AV branch which terminated in a posterolateral branch.  There    was 40% ostial narrowing of the AV circumflex artery.  There were 95%    stenoses at both edges of the stent in the intermedius branch.  The    proximal edge stenosis extended into the ostium of the intermedius branch. 4. The right coronary artery:  The right coronary artery was a large vessel    that gave rise to two right ventricular branches, a posterior descending,    and three posterolateral branches.  There was 40% mid, and 30% and 40%    distal stenosis in the right  coronary artery with marked irregularities.    There was 70% stenosis in the first posterolateral branch.  Left ventriculogram:  The left ventriculogram, performed in the RAO projection, showed good wall motion with no areas of hypokinesis.  The estimated ejection fraction was 60%.  Following cutting balloon angioplasty of the distal edge stenosis, the stenosis improved from 95% to less than 10%.  Following cutting balloon angioplasty of the ostial lesion, stenosis improved from 95% to 40%.  There was residual compromise of the ostium of the  circumflex artery which initially was 40% and following the procedure, it was estimated to be 50-60%.  CONCLUSION: 1. Coronary artery disease status post prior stenting and prior cutting    balloon angioplasty and brachytherapy for restenosis in the circumflex    intermedius vessel with 95% stenoses at both edges of the stent in the    circumflex intermedius vessel, 40% ostial stenosis of the circumflex    artery, 60-70% proximal stenosis in the left anterior descending artery    with 70% ostial stenosis of the first diagonal branch, 40% proximal and    distal stenosis in the right coronary artery with 70% stenosis in the    first posterolateral branch and normal left ventricular function. 2. Successful cutting balloon angioplasty of the peri-stent restenoses in    the circumflex intermedius vessel with improvement in the distal edge    stenosis from 95% to less than 10% and improvement in the proximal edge    stenosis from 95% to 40%.  DISPOSITION:  We were not able to perform as many balloon inflations and prolonged inflations at the proximal peri-stent restenoses as we would like because of concern about compromising the AV circumflex artery.  We had a fair result in this area.  I would recommend that we do an early stress test at two weeks.  If he has persistent ischemia, then we probably should bring him back for restudy and consider either rotational atherectomy of the intermedius branch or possible bypass surgery.  If his Cardiolite scan is nonischemic at two weeks, then he can be followed clinically with another follow-up scan suggested at six months. Dictated by:   Everardo Beals Juanda Chance, M.D. [facility] Attending:  Everardo Beals Juanda Chance, M.D. [facility] DD:  05/12/01 TD:  05/12/01 Job: [id number]

## 2010-09-29 NOTE — H&P (Signed)
NAME:  Eric Beltran, Eric Beltran NO.:  [id number]   MEDICAL RECORD NO.:  [id number]          PATIENT TYPE:  INP   LOCATION:  A218                          FACILITY:  [facility]   PHYSICIAN:  Margaretmary Dys, M.D.DATE OF BIRTH:  10-Apr-1941   DATE OF ADMISSION:  04/18/2005  DATE OF DISCHARGE:  LH                                HISTORY & PHYSICAL   ADMITTING DIAGNOSES:  1.  Supraventricular tachycardia.  2.  Chest pain rule out myocardial infarction.  3.  History of coronary artery disease, status post myocardial infarction      and stent placement.   PRIMARY CARE PHYSICIAN:  Edward L. Juanetta Gosling, M.D.   PRIMARY CARDIOLOGIST:  [facility].   CHIEF COMPLAINT:  Chest pain and palpitations at about 4 p.m. today.   HISTORY OF PRESENT ILLNESS:  Eric Beltran is a 70 year old Caucasian male who  presented to the emergency room with a one hour history of palpitations.  The patient said he went to a function at the [facility] and had some meal, felt  fine without any trouble, however, around 4 p.m. he felt some palpitations  in his chest.  After the palpitations, he then subsequently developed some  chest discomfort.  He said the discomfort was worse than what he had when he  had his previous MI.  He got in his car and initially decided to go home.  However with the increasing chest tightness, he decided to drive to [facility] here in the hospital.  On arrival there, the nurse who was  in triage noted that his pulse was very fast and decided to bring him by  wheelchair to the emergency room.  On arrival, the patient was found to be  in supraventricular tachycardia with a heart rate of 160.  He received  Cardizem 20 mg and Lopressor 5 mg, and he went into normal sinus rhythm with  a rate of 65-70, with spontaneous resolution of his chest pain.  The patient  currently describes no chest pain.  He denies any headaches, dizziness, or  lightheadedness, although he did feel a  little woozy when he was having his  palpitations.  He had no trouble driving.  He never felt this in the past.  He denies any orthopnea, paroxysmal nocturnal dyspnea, and he has been doing  well since his last cardiology evaluation.  He does have a fairly extensive history of coronary artery disease, dating  back to 2001, when he suffered a subendocardial myocardial infarction, non-Q  wave MI with a stent placed during that admission.  He was also noted to  have residual 70% left anterior descending and a 60% right coronary artery.  His ejection fraction was normal.  He has continued with his activities of  daily living without any limitation by chest pain or shortness of breath.  He reported his current chest tightness or pain is 0 out of 10 and the  patient appeared comfortable.   REVIEW OF SYSTEMS:  A 10-point review of systems otherwise negative except  as mentioned in history of present illness.   PAST MEDICAL  HISTORY:  1.  Non-Q wave MI with a ramus intermediate stent placement in 2001.  2.  Coronary artery disease with balloon angioplasty performed, in December      2002, due to a restenosis.      1.  He has had multiple other cardiology work ups.  He has had a total          of four cardiac catheterizations and the last in his record, in          April 2004, showed that he had a 30-40% multiple discrete lesions in          the proximal mid portion of his right coronary artery and the distal          TLA branch had a 60-70% disease.  Ejection fraction was 60%, the          residual disease was not bad enough to warrant coronary artery          bypass grafting.  The patient told me that he may have had a stent          then.  3.  Hypertension.  4.  Dyslipidemia.   MEDICATIONS:  1.  Altace 10 mg p.o. once a day.  2.  Vytorin 20 mg p.o. once a day.  3.  Aspirin 325 mg p.o. daily.  4.  Flomax 0.4 mg p.o. daily.  5.  Hydrochlorothiazide 25 mg p.o. daily.  6.  Fish oil capsules  daily.  7.  Vitamins once a day.   ALLERGIES:  He has no known drug allergies.   FAMILY HISTORY:  Positive for hypertension, coronary artery disease.   SOCIAL HISTORY:  The patient is a widower, wife died 12 years ago.  He  currently lives alone.  He has three adult children.  The patient does not  smoke nor does he drink alcohol or rather on a social basis.  He denies any  illicit drug use.  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   PHYSICAL EXAMINATION:  GENERAL:  Conscious, alert, comfortable, very  pleasant male.  Well oriented in time, place, and person.  VITAL SIGNS:  His blood pressure on arrival here was 150/94.  His heart rate  then was noted to be 162, found on 12-led EKG.  His pain then was a 4 out of  10.  Oxygen saturation was 99% on room air.  Respiratory rate of 12.  Temperature 97.5.  After the patient received 20 mg of Cardizem and 5 mg of  Lopressor, blood pressure was 117/78, pulse was 63, respiratory rate of 11,  saturation was 98%.  HEENT:  Normocephalic, atraumatic.  Oral mucosa was moist with no exudates.  NECK:  Supple.  No JVD, lymphadenopathy.  LUNGS:  Clear clinically with good air entry bilaterally.  HEART:  S1 S2, regular.  No S3, S4, gallops, or rubs.  ABDOMEN:  Soft, nontender.  Bowel sounds were positive.  No masses palpable.  EXTREMITIES:  No pitting pedal edema.  No calf induration or tenderness was  noted.  CNS:  Grossly intact with no focal deficits.   LABORATORY DATA:  His 12-lead EKG was supraventricular tachycardia with a  rate of 168.  I did not see any acute ST-T changes of note.  His subsequent  EKG after he had Cardizem and metoprolol, revealed a normal sinus rhythm  with a rate of 63.  His chest x-ray was negative for any acute  cardiopulmonary process.  White blood cell count 10.2, hemoglobin 15.4, hematocrit 45.7, platelet count was 309, neutrophils 63, lymphocytes 24%.  PT was 12.8, INR 0.9.  Sodium was 139,  potassium 3.7, chloride of 105, CO2  24, glucose 99, BUN of 15, creatinine 1.2, calcium 9.2.  Cardiac enzymes  were negative.  Urinalysis was negative.   ASSESSMENT:  Eric Beltran, Eric Beltran is a 70 year old Caucasian male with  extensive history of coronary artery disease and multiple cardiac  catheterizations and angioplasties in the last five years.  The patient  presented to the emergency room with sudden onset of palpitations and chest  pain.  He was found to be in supraventricular tachycardia.  He received some  Cardizem and metoprolol with resolution of his chest pain and return of his  rhythm to normal sinus rhythm.   PLAN:  1.  Admit the patient at this time.  He will be admitted to telemetry.  2.  We will obtain his cardiac enzymes.  3.  I will request cardiology to see him in the morning.  4.  It is unclear why he went into supraventricular tachycardia but based on      his history of coronary artery disease, I am concerned that this may be      a focus of atrial-tachy arrhythmia that may need to be further evaluated      by cardiology.  5.  He will be on telemetry.  6.  I will resume all his home medications.  7.  I see that he is not on any rate control medication.  The patient may      need a beta-blocker such as Lopressor or Cardizem to prevent this SVT,      again I will defer to cardiology in this regard.  8.  I have discussed the above plan with him and the patient verbalized full      understanding.  I also explained the role of the hospitalist team to      him.  9.  DVT prophylaxis will be with Lovenox 40 mg subcu once a day.      Margaretmary Dys, M.D.  Electronically Signed     AM/MEDQ  D:  04/18/2005  T:  04/18/2005  Job:  [id number]   cc:   Ramon Dredge L. Juanetta Gosling, M.D.  Fax: [phone]

## 2010-09-29 NOTE — Cardiovascular Report (Signed)
NAME:  Eric Beltran, Eric Beltran NO.:  [id number]   MEDICAL RECORD NO.:  [id number]                   PATIENT TYPE:  OIB   LOCATION:  2869                                 FACILITY:  [facility]   PHYSICIAN:  Charlies Constable, M.D. [facility]              DATE OF BIRTH:  Oct 08, 1940   DATE OF PROCEDURE:  08/27/2002  DATE OF DISCHARGE:                              CARDIAC CATHETERIZATION   CLINICAL HISTORY:  The patient is 70 years old and has had previous  percutaneous interventions.  He has had a stent placed to the intermedius  branch of the circumflex artery and has had cutting balloon angioplasty and  brachytherapy for in-stent restenosis, and then has had cutting balloon  angioplasty for stenosis at both edges following brachytherapy.  The last  intervention was a year ago.  He now returns with 2 weeks of exertional  chest pain.  He was studied earlier today by Dr. Eden Emms and found to have  severe stenosis within his stent within the ramus branch with TIMI-1 flow.   DESCRIPTION OF PROCEDURE:  The procedure was performed via the right femoral  artery after exchanging for the previous sheath with a new 7-French sheath.  We placed a venous sheath in the right femoral vein for access.  We used a  CLS 4.0, 7-French guiding catheter with sideholes.  The patient was given  Angiomax bolus and infusion and 300 mg of Plavix.  Our approach was to  rotablade the intermedius branch and then plan to stent the intermedius  branch; however, we knew this would compromise the ostium of the circumflex  artery from prior interventions, and we had planned to place a stent in the  ostium of the circumflex artery as well, performing V-stenting.   We were able to navigate a Graphix PT wire down the intermedius branch with  only a moderate amount of difficulty.  We performed a Rotablation with a 1.5  bur, performing 4 runs at 170,000 rpm for approximately 10 seconds each.  We  then exchanged for a  1.75 bur and performed 3 runs at approximately 160,000  rpm for 10 seconds each.  We then went in with a 2.5 x 20-mm Quantum  Maverick and performed 1 inflation up to 10 atmospheres for 30 seconds.  We  then wired the AV circumflex artery with a luge wire.  We passed a 2.5 x 8-  mm Taxus stent down the circumflex artery.  We then positioned a 2.5 x 24-mm  stent in the intermedius branch and deployed this right at the ostium of the  intermedius branch with 1 inflation of 12 atmospheres for 30 seconds.  The  tip of the stent extended just to the edge or just beyond the edge of the  previous Pixel stent.  This was a 2.5 x 18-mm Pixel stent.  We then pulled  the 2.5 x 8-mm Taxus stent in the  AV circumflex artery back to the ostium  and deployed this with 1 inflation of 12 atmospheres for 30 seconds while  simultaneously inflating the balloon in the intermedius branch to 8  atmospheres.  Repeat diagnostic study was then performed through the guiding  catheter.  The patient tolerated the procedure well and left the laboratory  in satisfactory condition.   RESULTS:  Initially the stenosis in the intermedius branch was 99% with TIMI-  1 flow.  Following stenting, this improved to less than 10% with TIMI-3  flow.  The stenosis in the ostium of the circumflex artery was initially  50%, and following stenting in the intermedius, it increased to 70% to 80%,  and following stenting, it improved to less than 10%.    CONCLUSION:  Successful placement of Taxus stents at the ostium of the ramus  branch of the circumflex artery and the ostium of the AV circumflex artery  using V-stenting with improvement in the ramus stenosis from 99% to less  than 10%, and improvement in the ostial stenosis of the circumflex artery  from 50% to less than 10.   DISPOSITION:  The patient was returned to the post angioplasty unit for  further observation.                                               Charlies Constable, M.D.  [facility]    BB/MEDQ  D:  08/27/2002  T:  08/28/2002  Job:  [id number]   cc:   Ramon Dredge L. Juanetta Gosling, M.D.  [address]  [place]  [place] [address]  Fax: [phone]   Charlton Haws, M.D. [facility]   Cardiac Catheterization Lab   [name], M.D. [facility]

## 2010-09-29 NOTE — Procedures (Signed)
NAME:  Eric, Beltran NO.:  [id number]   MEDICAL RECORD NO.:  [id number]          PATIENT TYPE:  INP   LOCATION:  A218                          FACILITY:  [facility]   PHYSICIAN:  [place] Bing, M.D. [facility] OF BIRTH:  01-Sep-1940   DATE OF PROCEDURE:  04/19/2005  DATE OF DISCHARGE:                                  ECHOCARDIOGRAM   REFERRING PHYSICIAN:  Margaretmary Dys, M.D./Robert Dietrich Pates, M.D.   CLINICAL DATA:  A 70 year old gentleman with SVT and coronary disease.   M-MODE TRACINGS:  Aorta 3.6 at the aortic annulus; 4.8 in the proximal  ascending aorta.  Left atrium 4.5, posterior wall 1.2, LV diastole 4.6, LV  systole 2.6.   FINDINGS:  1.  Technically adequate echocardiographic study.  2.  Normal left atrium and right atrium.  3.  Right ventricular size at the upper limit of normal; no RVH; normal RV      systolic function.  4.  Aortic valve is trileaflet and normal; mild aortic insufficiency.  5.  Proximal ascending and transverse aorta is mildly to moderately dilated.  6.  Normal mitral valve; mild annular calcification.  7.  Normal tricuspid and pulmonic valves; mild dilatation of the proximal      pulmonary artery.  8.  Normal left ventricular size; mild concentric hypertrophy; normal      regional and global LV systolic function.      [place] Bing, M.D. [facility]  Electronically Signed     RR/MEDQ  D:  04/19/2005  T:  04/20/2005  Job:  [id number]

## 2010-09-29 NOTE — Cardiovascular Report (Signed)
[place]. [facility]  Patient:    Eric Beltran, Eric Beltran                     MRN: [id number] Proc. Date: 09/03/00 Adm. Date:  [id number] Disc. Date: [id number] Attending:  Mirian Mo CC:         [name], M.D.  Thomas C. Wall, M.D. [facility]  Cardiopulmonary Lab   Cardiac Catheterization  PROCEDURE:  Cardiac catheterization.  CARDIOLOGISTEverardo Beals Juanda Chance, M.D. [facility]  CLINICAL HISTORY:  Mr. Swigert is 70 years old and has documented coronary disease with a stent placed to the intermediate branch of the circumflex artery by Dr. Gerri Spore in November 2001 after a non-Q-wave myocardial infarction.  He did well since that time until the past several weeks when he developed exertional chest tightness which is somewhat different than the pain he had prior to his stent placement.  He was seen by Dr. Daleen Squibb in consultation and scheduled for angiography.  He had a negative Cardiolite scan in January.  PROCEDURE IN DETAIL:  The procedure was performed via the right femoral artery using arterial sheath and 6-French preformed coronary catheters.  A frontal arterial puncture was performed, and Omnipaque contrast was used.  After completion of the diagnostic study, we closed the right femoral artery with Perclose.  The patient tolerated the procedure well and left the laboratory in satisfactory condition.  RESULTS:  Left main coronary artery was free of significant disease.  Left anterior descending artery gave rise toe two diagonal branches and a large septal perforator.  There was 60% narrowing before the first diagonal branch.  The left circumflex artery gave rise to an intermediate branch and an A-V branch which terminated in a posterolateral branch.  There was 90% diffuse in-stent restenosis in the stent in the intermediate branch which was a 2.5 x 18 Pixel.  The right coronary artery was a moderate size vessel that gave rise to a conus branch, two right  ventricular branches, a posterior descending branch, and two posterolateral branches.  There was 30% mid stenosis and 40% stenosis distal to the posterior descending branch.  There was 50% stenosis in the first posterolateral branch.  The left ventriculogram performed in the RAO projection showed good wall motion with no evidence of hypokinesis.  The estimated ejection fraction was 60%.  The aortic pressure was 128/70 with mean of 19.  The left ventricular pressure was 128/11.  CONCLUSION:  Coronary artery disease status post stenting of the intermediate branch of the circumflex artery November 2001 with a 90% diffuse in-stent restenosis at the stent site in the intermediate branch, 40% mid stenosis in the circumflex artery, 60% proximal stenosis in the left anterior descending artery, 30% mid and 40% distal stenosis in the right coronary artery with normal left ventricular function.  RECOMMENDATIONS:  The patient has diffuse in-stent restenosis, and I think the best option would be to schedule him for a cutting balloon angioplasty combined with brachytherapy.  We will plan to let him go home later today and come back Thursday for the procedure. DD:  09/03/00 TD:  09/04/00 Job: [id number]

## 2010-09-29 NOTE — Discharge Summary (Signed)
[place]. [facility]  Patient:    Eric Beltran, Eric Beltran Visit Number: [id number] MRN: [id number]          Service Type: OUT Location: RAD Attending Physician:  Mirian Mo Dictated by:   Rozell Searing, P.A. Admit Date:  05/29/2001 Discharge Date: 05/29/2001   CC:         [name], M.D.   Discharge Summary  PROCEDURES:  Coronary angiogram/"cutting balloon" PTCA, a circumflex secondary to peristent restenosis.  HOSPITAL COURSE:  The patient is a 70 year old male with known coronary artery disease, notable for previous inferior non-Q-wave MI November 2001, treated with stenting of the ramus, who then was found to have 90% in-stent restenosis in April, requiring treatment with brachytherapy and placed on Plavix x6 months.  More recently, however, the patient reported an approximate two-month history of mild exertional chest discomfort relieved with rest.  He had been seen recently in the office and was referred for a stress test and an echocardiogram.  However, he presented to [facility] with complaint of chest tightness while walking and relieved with rest.  Following treatment with aspirin and placement on Lovenox, he was transferred to [facility] for further management and diagnostic evaluation.  LABORATORY DATA:  WBC 11.3, hemoglobin 12.8, hematocrit 56.4, platelets 209. On admission INR 1.2. Sodium 139, potassium 4.4, glucose 102, BUN 15, creatinine 1.0 on admission.  Cardiac enzymes:  CPK/MB negative x3; troponin I 0.02, 0.01.  Lipid profile:  Total cholesterol 173, triglycerides 258, HDL 37, LDL 84 (cholesterol/HDL ratio 4.7).  Negative urine culture.  HOSPITAL COURSE:  Following transfer from [facility], the patient was placed on intravenous heparin and Nitropaste.  Serial cardiac enzymes were negative for myocardial infarction.  The patient reported no further recurrent chest discomfort and plans were  to proceed with relook coronary angiogram with possibly brachytherapy.  Cardiac catheterization performed the following day by Bruce R. Juanda Chance, M.D., (see cath report for full details), revealed 60-70% proximal LAD, 70% diagonal I, 95% restenosis of the ramus intermedius at both the proximal and distal edges of the stent; 40% ostial circumflex; 40% mid, distal RCA; 70% PL.  Left ventriculogram was normal.  Dr. Juanda Chance preceded with successful "cutting balloon" PTCA of the peristent restenotic lesions with reduction of the distal edge lesion from 95% to less than 10% and improvement in the proximal edge lesion from 95% to 40% residual stenosis.  Dr. Juanda Chance recommended early outpatient stress test at two weeks and, if the patient were to have persistent ischemia, to return for a relook coronary angiogram and consideration of either rotational atherectomy of the ramus intermedius or possible bypass surgery.  If the scan were negative, however, he recommended medical therapy with a follow-up scan in six months.  MEDICATIONS AT DISCHARGE: 1. Enteric coated aspirin 325 mg q.d. 2. Altace 10 mg q.d. 3. Zocor 40 mg q.h.s. 4. Hydrochlorothiazide 12.5 mg q.d. 5. Foltx one tablet daily. 6. Nitrostat as directed. 7. Ambien 10 mg q.h.s. p.r.n.  DISCHARGE INSTRUCTIONS:  The patient was instructed by Dr. Daleen Squibb to lose approximately 10 pounds over the next six to eight weeks.  Arrangements will be made for an outpatient exercise stress Cardiolite in approximately two weeks with Dr. Juanito Doom.  DISCHARGE DIAGNOSES: 1. Coronary artery disease progression.    a. Status post successful "cutting balloon" percutaneous transluminal       coronary angioplasty of high grade ramus intermedius peristent       restenoses.  b. Status post "cutting balloon" percutaneous transluminal coronary       angioplasty/brachy therapy April 2002 - secondary to restenosis.    c. Status post inferior non-Q-wave  myocardial infarction/stent ramus       intermedius November 2001. 2. Dyslipidemia. 3. Hypertension. Dictated by:   Rozell Searing, P.A. Attending Physician:  Mirian Mo DD:  06/18/01 TD:  06/19/01 Job: [id number]

## 2010-09-29 NOTE — Procedures (Signed)
[facility]  Patient:    Eric Beltran, Eric Beltran Visit Number: [id number] MRN: [id number]          Service Type: OUT Location: RAD Attending Physician:  Cain Sieve Admit Date:  05/29/2001 Discharge Date: 05/29/2001                                Stress Test  EXERCISE STRESS TEST  CHART NUMBER:  [id number]  INDICATIONS:  [name] is a 70 year old male with known coronary artery disease, notable for history of non-Q-wave MI/stent ramus intermedius, treated with cutting balloon, PTCA/brachytherapy this past April secondary to in-stent restenosis and who, more recently, underwent cutting balloon angioplasty secondary to 95% peri-stent restenosis.  TEST:  The patient exercised a total of 12 minutes and 15 seconds on standard Bruce protocol.  The test was discontinued secondary to fatigue.  The patient reported no chest discomfort throughout the procedure.  Baseline heart rate 54 to maximum 152 (94% of PMHR; 12.9 METs).  Blood pressure rose from 114/74 baseline to 210/90 maximum, and a final reading of 168/80.  Serial EKG tracings revealed no significant ST abnormalities.  No dysrhythmias noted.  CONCLUSION:  Negative adequate treadmill test; Cardiolite images pending. Attending Physician:  Cain Sieve DD:  05/29/01 TD:  05/31/01 Job: [id number]

## 2010-09-29 NOTE — Consult Note (Signed)
NAME:  Eric Beltran, Eric Beltran NO.:  [id number]   MEDICAL RECORD NO.:  [id number]          PATIENT TYPE:  INP   LOCATION:  A218                          FACILITY:  [facility]   PHYSICIAN:  Mantachie Bing, M.D. [facility] BIRTH:  25-Aug-1940   DATE OF CONSULTATION:  04/19/2005  DATE OF DISCHARGE:                                   CONSULTATION   REFERRING PHYSICIAN:  Margaretmary Dys, M.D.   PRIMARY CARE PHYSICIAN:  Edward L. Juanetta Gosling, M.D.  Primary cardiologist, Dr.  Dorethea Clan.   HISTORY OF PRESENT ILLNESS:  A 70 year old gentleman with known coronary  disease who presents with chest discomfort and PSVT.  Eric Beltran has a long  complex history of coronary artery disease dating back to at least 2002,  when he presented with unstable angina.  He has had multiple percutaneous  interventions.  His most recent catheterization in April 2004, revealed  moderate stenoses in all three coronary arteries and multiple branches.  Stents were placed for a 60% stenosis of the ostial circumflex and for  treatment of a 95% proximal ramus intermedius lesion.  I cannot locate  stress test since that intervention, although the patient believes he has  had one within the past year or two.  Nonetheless, he has done well  symptomatically.  He presented with the sudden onset of palpitations  followed by vague upper chest discomfort.  He drove himself to the hospital  where PSVT was identified and treated with intravenous beta-blocker and  Diltiazem with rapid resolution.  Once heart rate had returned to normal,  all symptoms resolved.  The patient believes he has had two her three prior  similar episodes, but these were short-lived and self-limited.[name]   PAST MEDICAL HISTORY:  Past medical history is otherwise unremarkable. The  patient is treated for hyperlipidemia and hypertension.   ALLERGIES:  He reports no drug allergies.   CURRENT MEDICATIONS:  1.  Ramipril 10 mg daily.  2.  Vytorin 10/20 mg  daily.  3.  Aspirin 325 mg daily.  4.  Flomax 0.4 mg daily.  5.  HCTZ 25 mg daily.  6.  Fish oil 1 g daily.  7.  Multivitamin.   SOCIAL HISTORY:  Self-employed.  Widower with three adult children.  He  lives alone in [place].  No use of tobacco products nor significant consumption  of alcohol.   FAMILY HISTORY:  No coronary or vascular disease in first-degree relatives.   REVIEW OF SYSTEMS:  Notable for occasional arthralgias.  All other systems  reviewed and are negative.  Current vaccinations are up-to-date.   PHYSICAL EXAMINATION:  GENERAL:  On exam, pleasant gentleman in no acute  distress.  VITAL SIGNS:  The temperature is 97.7, heart rate 50 and regular,  respirations 20, blood pressure 130/85, weight 188, O2 saturations 96% on  room air.  HEENT:  Anicteric sclerae; pupils equal, round, react to light.  NECK:  No jugular venous distension; normal carotid upstrokes without  bruits.  LUNGS:  Clear.  CARDIAC:  Normal S1, S2.  S4 present.  ABDOMEN:  Soft and nontender; no masses; no  organomegaly.  EXTREMITIES:  No edema; distal pulses intact.  NEUROMUSCULAR:  Symmetric strength and tone; normal cranial nerves.  MUSCULOSKELETAL:  No joint deformities.  SKIN:  No significant lesions.  PSYCHIATRIC:  Alert and oriented; normal affect.   LABORATORY DATA AND X-RAY FINDINGS:  Chest x-ray with mild cardiomegaly;  otherwise unremarkable.  EKG showed initially SVT with the T-wave possibly  inscribed at the end of the QRS.  Heart rate was 168.  After conversion,  there was evidence for a prior myocardial infarction.  ST segments had  returned to normal.   Other laboratory notable for normal renal function, normal electrolytes;  normal CBC.  Cardiac markers showed normal CPK and MB with slightly elevated  troponin of 0.23.   IMPRESSION:  [name] presents with PSVT with all the usual considerations  and possible mechanisms.  This is unrelated to his coronary disease.    RECOMMENDATIONS:  The development of chest discomfort during paroxysmal  supraventricular tachycardia is of some concern, but often occurs even in  the absence of significant coronary disease.  The minimal elevation in  troponins can be attributed to an increase in left atrial pressure during  the arrhythmia.  Nonetheless, it would be prudent to obtain a stress test.  This can be done on an outpatient basis.  Initial treatment will be medical  with Diltiazem 240 mg daily.  Ablation was described to Mr. Bolander, but he is  not eager to consider this at present.  We will plan to see him back in the  office after a stress test has been completed.   We appreciate the opportunity to provide consultation services to this nice  gentleman.      Stidham Bing, M.D. [facility]  Electronically Signed     RR/MEDQ  D:  04/20/2005  T:  04/20/2005  Job:  [id number]

## 2010-09-29 NOTE — Letter (Signed)
September 04, 2006    Dr. [name]  [address]  [place], [place] [address]   RE:  DJON, TITH  MRN:  [id number]  /  DOB:  [birth year]   Dear Renae Fickle,   Mr. Monk returns today the office after a long hiatus after being  unable to renew his prescription for Vytorin. Since he last saw Dr.  Dorethea Clan approximately 18 months ago, he has been well. He reports some  occasional mild chest discomfort that is nothing like the angina that  resulted in multiple coronary interventions between 2002 and 2004. He  has had no exertional dyspnea although lifestyle is relatively  sedentary. He has had no major new health issues. He did have an episode  of palpitations, presumable representing his PSVT. This terminated after  approximately 15-30 minutes of attempting techniques to increase vagal  tone.   CURRENT MEDICATIONS:  Include;  1. Ramipril 10 mg daily.  2. Vytorin 10/20 mg daily.  3. Aspirin 81 mg daily.  4. Acyclovir 400 mg daily for herpes labius.  5. Flomax 0.8 mg daily.   On exam, healthy-appearing gentleman. The weight is 193, five pounds  more than in September 2005, blood pressure 135/75, heart rate 85 and  regular, respirations 16.  NECK: No jugular venous distension; normal carotid upstrokes without  bruits.  LUNGS: Clear.  CARDIAC: Normal first and second heart sounds; modest systolic ejection  murmur.  ABDOMEN: Soft and nontender; no bruits; no organomegaly; aortic  pulsation not palpable.  EXTREMITIES: No edema; distal pulses intact.   IMPRESSION:  Mr. Cassarino has done very well following multiple restenosis  a few years ago. Although he has drug-eluting stents, he is not being  treated with clopidogrel. It would not appear necessary to add that  medication at the present time. He has been given. Diltiazem for PSVT in  the past but does not take it due to fatigue. With very infrequent  episodes, daily use of medication is not warranted. He will use  Diltiazem p.r.n.  if he can not terminate tachycardia in the future. He  will call should tachycardia be associated with dyspnea or chest pain. A  lipid profile is pending. If results are good, I will see him again in 8  Months.    Sincerely,      Gerrit Friends. Dietrich Pates, MD, [facility]  Electronically Signed    RMR/MedQ  DD: 09/04/2006  DT: 09/04/2006  Job #: [id number]

## 2010-09-29 NOTE — Cardiovascular Report (Signed)
[place]. [facility]  Patient:    Eric Beltran, Eric Beltran                     MRN: [id number] Proc. Date: 04/04/00 Adm. Date:  [id number] Attending:  Nathen May CC:         [name], M.D.  Nathen May, M.D. [facility]  Cardiac Catheterization Laboratory   Cardiac Catheterization  PROCEDURES PERFORMED: 1. Left heart catheterization with coronary angiography and left    ventriculography. 2. Percutaneous transluminal coronary angioplasty with stent placement    in the ramus intermediate branch.  INDICATIONS:  Mr. Prior is a 70 year old male who presented to [facility] this morning with ongoing substernal chest pain which started at approximately 3 a.m.  At [facility] he was treated with heparin and nitroglycerin and a bolus of Integrilin.  After these measures his chest pain resolved.  Initial ECG showed subtle inferior ST segment elevation and anterior ST segment depression.  He was transferred emergently to [facility] for cardiac catheterization.  CATHETERIZATION PROCEDURE:  A 7 French sheath was placed in the right femoral artery.  Diagnostic catheterization was performed with a 6 [place] JL5, 6 [place] JR4 and 6 French angled pigtail catheters.  Contrast was Hexabrix. There were no complications.  RESULTS:  HEMODYNAMICS:  Left ventricular pressure 118/22.  Aortic pressure 114/72. There was no aortic valve gradient.  LEFT VENTRICULOGRAM:  Wall motion is normal.  Ejection fraction calculated at 64%.  There is 1+ mild mitral regurgitation.  CORONARY ARTERIOGRAPHY:  (Right dominant).  Left main:  Left main has a distal 20% stenosis.  Left anterior descending:  The left anterior descending artery has a tubular 70% stenosis in the proximal vessel.  This extends to the origin of the small first diagonal branch.  The mid LAD has a 20% stenosis and the distal LAD has a diffuse 30% stenosis.  The first diagonal branch  is small and has a diffuse 30% stenosis.  The second diagonal branch is small.  Left circumflex:  The left circumflex gives rise to a large branching ramus intermedius and a large single obtuse marginal branch.  There is a 30% stenosis at the ostium of the circumflex and a diffuse 30% stenosis in the mid circumflex extending into the first marginal branch.  There is a large branching ramus intermediate.  This has a long 95% stenosis with haze but TIMI-3 flow.  Just beyond this 95% stenosis is a small focal aneurysm.  This ramus intermediate then bifurcates and each limb of the bifurcation is a 30% stenosis.  Right coronary artery:  The right coronary artery is a dominant vessel.  There is diffuse ectasia in the proximal mid vessel.  There is a 25% stenosis in the mid vessel.  The distal vessel has a tubular 40% stenosis before the posterior descending artery followed by a 60% stenosis after the posterior descending artery.  The posterior descending artery is a large vessel.  The distal right coronary gives rise to a normal sized first posterolateral, normal second posterolateral and a small posterolateral branch.  IMPRESSION: 1. Preserved left ventricular systolic function with mild mitral    regurgitation. 2. Three-vessel coronary artery disease.  The culprit lesion appears to    be the ramus intermediate branch which has a long 95% stenosis with    haze and the appearance of probable ruptured plaque.  The disease in the    left anterior descending artery is  of borderline severity and the disease    of the right coronary artery is of moderate severity.  PLAN:  These images were reviewed with Dr. Juanda Chance.  After discussed we opted to proceed with percutaneous intervention of the ramus intermediate branch followed by an outpatient functional study to access the significance of the residual disease.  PERCUTANEOUS TRANSLUMINAL CORONARY ANGIOPLASTY PROCEDURE:  Heparin and Integrilin  were administered per protocol.  The patient had received one bolus of Integrilin at [facility] so we therefore gave the second bolus here.  We used a 7 Jamaica Voda left 4 guiding catheter and a BMW wire.  Predilatation lesion was performed with a 2.5 x 20 mm Quantum Ranger balloon which was inflated initially to 10 atmospheres and then a second inflation to 12 atmospheres was performed.  We then deployed at 2.5 x 18 mm Pixel stent at a pressure of 11 atmospheres.  This stent was then post dilated with a 2.5 x 20 mm Quantum balloon which was inflated to 18 atmospheres.  Final angiographic images revealed patency of the vessel with 0% residual stenosis and TIMI-3 flow.  COMPLICATIONS:  None.  RESULTS:  Successful PTCA with stent placement in the ramus intermedius branch reducing a 95% stenosis with haze to 0% residual with TIMI-3 flow.  PLAN:  Integrilin will be continued for 24 hours.  Plavix will be administered for four weeks.  It was recommended that a stress Cardiolite be obtained in four to six weeks to access his functional significance of the residual disease in the left anterior descending artery and the right coronary artery. D:  04/04/00 TD:  04/05/00 Job: [id number]

## 2010-09-29 NOTE — Discharge Summary (Signed)
[place]. [facility]  Patient:    Eric Beltran, Eric Beltran                     MRN: [id number] Adm. Date:  [id number] Disc. Date: [id number] Attending:  Nathen May Dictator:   Abelino Derrick, P.A.C. [facility]                  Referring Physician Discharge [place]  DISCHARGE DIAGNOSES: 1. Subendocardial myocardial infarction treated with ramus intermediate stent    this admission. 2. Residual coronary disease with 70% left anterior descending and 60% right    coronary artery. 3. Good left ventricular function. 4. Treated hypertension. 5. Hyperlipidemia.  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  EKG showed inferior ST elevation.  He was treated with nitrates, aspirin, beta blocker, heparin, and IIb-IIIa bolus and transferred to [place] [facility].  HOSPITAL COURSE:  He underwent urgent catheterization by Daisey Must, M.D., which revealed 60% distal RCA, 95% ramus intermedius, and 70% proximal LAD. He underwent stenting to the ramus intermedius with good final results.  Daisey Must, M.D., suggested an exercise Cardiolite study in four to six weeks. His enzymes did come back positive with a CK of 177, an MB of 11, and a troponin of 3.  The patient was transferred to the floor and ambulated.  He was kept on Integrilin for 24 hours post procedure.  Beta blocker was stopped because of significant bradycardia in the 40s.  Thomas C. Wall, M.D., feels that he can be discharged on April 06, 2000.  He will see him in the office in follow-up.  His lipid profile prior to discharge showed triglycerides of 332, an HDL of 26, and an LDL of 81.  We will recheck his lipids in four to six weeks.  DISCHARGE MEDICATIONS: 1. Coated aspirin q.d. 2. Plavix 75 mg  a day for four weeks. 3. Altace 5 mg twice a day. 4. Nitroglycerin sublingual p.r.n.  LABORATORY DATA:  Sodium 137, potassium 3.9, BUN 10, creatinine 1.1.  White count 14.9, hematocrit 34.2, hemoglobin 12.2, platelets 270.  The EKG showed sinus rhythm and sinus bradycardia with inferior Q waves.  DISPOSITION:  The patient was discharged in stable condition.  FOLLOW-UP:  Will follow up with Maisie Fus C. Daleen Squibb, M.D., in [place], [place]. DD:  04/06/00 TD:  04/06/00 Job: [id number]

## 2010-09-29 NOTE — Group Therapy Note (Signed)
NAME:  Eric Beltran, Eric Beltran NO.:  [id number]   MEDICAL RECORD NO.:  [id number]          PATIENT TYPE:  INP   LOCATION:  A218                          FACILITY:  [facility]   PHYSICIAN:  Margaretmary Dys, M.D.DATE OF BIRTH:  1940-08-30   DATE OF PROCEDURE:  04/19/2005  DATE OF DISCHARGE:                                   PROGRESS NOTE   SUBJECTIVE:  The patient had a fairly good night.  He denies any more chest  pain or palpitations.  He has no diaphoresis.  No fever.  No chills.  No  rigors.  No cough.  No shortness of breath.  No paroxysmal nocturnal dyspnea  or orthopnea.  His telemetry monitoring was unremarkable.   OBJECTIVE:  Conscious, alert x 2.  Not in acute distress.  The patient was  quite pleasant.  Blood pressure was 141/85, pulse was ranging from 52 to 64,  temperature 97.7, respiratory rate of 18, oxygen saturation was 96% on room  air.   HEENT EXAM:  Normocephalic, atraumatic.  Oral mucosa was moist with no  exudates.  Neck was supple.  No JVD or lymphadenopathy.  Lungs:  Clear  clinically with good air entry bilaterally.  Heart:  S1, S2.  Regular rhythm  and rate.  No gallops or rubs.  Abdomen:  Soft, nontender.  Bowel sounds  were positive.  No masses palpable.  Extremities:  No pitting pedal edema.  No calf induration or tenderness was noted.  CNS EXAM:  Grossly intact with  no focal deficits.   LABORATORY DATA:  A 2-D echocardiogram ordered by Cardiology is pending.  White blood cell count was 8.8, hemoglobin 13, hematocrit 38, platelet count  was 274.  There was no left shift.  Sodium was 141, potassium 3.8, chloride  111, CO2 29, glucose 106, BUN 15, creatinine 1.0, calcium 8.4, magnesium  2.1, total creatine kinase was 101, CK-MB was 2.2,relative index was  negative at 2.2, and his cardiac enzymes peaked at 0.23.  Urinalysis was  unremarkable.   ASSESSMENT AND PLAN:  Mr. Sherrard was admitted yesterday with supraventricular  tachycardia and chest  pain.  The patient has remained stable after receiving  some Cardizem and Lopressor in the emergency room.  He had no more  recurrences of chest pain or palpitations.   The patient will be seen by Cardiology today and a plan will be made.  I am  concerned that his enzymes went up a little bit and the supraventricular  tachycardia with a rate of 163 may have been a stress test for him.  I will  defer to Cardiology as to the final disposition, but hopefully they will  address the cardiac enzymes if there are other indications to do so,  especially in the setting of multiple cardiac workups in the last five  years.      Margaretmary Dys, M.D.  Electronically Signed    AM/MEDQ  D:  04/19/2005  T:  04/19/2005  Job:  [id number]

## 2010-09-29 NOTE — Cardiovascular Report (Signed)
   NAME:  Eric Beltran, Eric Beltran NO.:  [id number]   MEDICAL RECORD NO.:  [id number]                   PATIENT TYPE:  OIB   LOCATION:  2869                                 FACILITY:  [facility]   PHYSICIAN:  Charlton Haws, M.D. [facility]              DATE OF BIRTH:  1940/10/22   DATE OF PROCEDURE:  DATE OF DISCHARGE:                              CARDIAC CATHETERIZATION   PROCEDURE:  Coronary arteriography.   INDICATIONS:  Recurrent chest pain, prescription brachytherapy and stent  placement to the intermediate branch with multiple stenoses.   FINDINGS:  The left main coronary artery had a 30% discrete stenosis.   The left anterior descending artery had 50% to 60% disease in the proximal  and mid vessel.  Distal vessel had 30% to 40% multiple discrete lesions.   First diagonal branch had 60% ostial disease.   The circumflex coronary artery had 40% proximal disease.  The mid vessel had  50% disease at the takeoff of the first obtuse marginal branch.  The  intermediate branch had TIMI-2 flow.  It was a large bifurcating branch with  95% stenosis with apparent clot in the proximal vessel.   The right coronary artery was dominant.  There was 30% to 40% multiple  discrete lesions in the proximal mid portion.  The distal PLA branch had 60%  to 70% disease.   RAO ventriculography was normal, EF of 60%.  There was no gradient across  the aortic valve and no MR.  Aortic pressure was 112/84, LV pressure was  112/14.   There was also moderate aortic root dilatation by ventriculogram.  JL5  catheter would have been a better fit for injecting the left main, but we  were able to use a JL4 catheter.   IMPRESSION:  Films will be reviewed with Dr. Juanda Chance.  I do not think that  his residual disease is bad enough to warrant coronary artery bypass  grafting.  He certainly needs some sort of revascularization of the  intermediate branch.  Dr. Juanda Chance will review these films and make  recommendations.                                               Charlton Haws, M.D. [facility]    PN/MEDQ  D:  08/27/2002  T:  08/27/2002  Job:  [id number]

## 2010-10-04 ENCOUNTER — Telehealth: Payer: Self-pay | Admitting: Cardiology

## 2010-10-04 NOTE — Telephone Encounter (Signed)
Pt would like a 90 day supply called in to [facility] for ramapril 10mg  and diltiazem 240mg .  Pt would also like labs results.

## 2010-10-06 ENCOUNTER — Other Ambulatory Visit: Payer: Self-pay

## 2010-10-06 MED ORDER — DILTIAZEM HCL 240 MG PO CP24: 240.0000 mg | ORAL_CAPSULE | Freq: Every day | ORAL | Status: DC

## 2010-10-06 MED ORDER — RAMIPRIL 10 MG PO CAPS: 10.0000 mg | ORAL_CAPSULE | Freq: Every day | ORAL | Status: DC

## 2010-10-12 DIAGNOSIS — Z85828 Personal history of other malignant neoplasm of skin: Secondary | ICD-10-CM | POA: Insufficient documentation

## 2010-10-13 ENCOUNTER — Telehealth: Payer: Self-pay | Admitting: *Deleted

## 2010-10-13 MED ORDER — ATORVASTATIN CALCIUM 40 MG PO TABS: 40.0000 mg | ORAL_TABLET | Freq: Every day | ORAL | Status: DC

## 2010-10-13 NOTE — Telephone Encounter (Signed)
Spoke with pt and discussed medication change from pravstatin to atorvastatin

## 2010-10-13 NOTE — Telephone Encounter (Signed)
Message copied by Augusto Gamble on Fri Oct 13, 2010  4:06 PM ------      Message from: Gaylord Shih      Created: Fri Oct 06, 2010  1:46 PM       What is he taking.Marland KitchenMarland KitchenI do not see a statin on chart.

## 2010-10-24 ENCOUNTER — Encounter: Payer: Self-pay | Admitting: Cardiology

## 2010-10-30 ENCOUNTER — Other Ambulatory Visit: Payer: Self-pay | Admitting: Surgery

## 2010-10-30 DIAGNOSIS — Z139 Encounter for screening, unspecified: Secondary | ICD-10-CM

## 2010-10-31 ENCOUNTER — Ambulatory Visit
Admission: RE | Admit: 2010-10-31 | Discharge: 2010-10-31 | Disposition: A | Discharge status: Home / Self Care | Payer: Medicare Other | Source: Ambulatory Visit | Attending: Surgery | Admitting: Surgery

## 2010-10-31 ENCOUNTER — Encounter (INDEPENDENT_AMBULATORY_CARE_PROVIDER_SITE_OTHER): Payer: Medicare Other | Admitting: Surgery

## 2010-10-31 ENCOUNTER — Encounter: Payer: Medicare Other | Admitting: Surgery

## 2010-10-31 DIAGNOSIS — Z139 Encounter for screening, unspecified: Secondary | ICD-10-CM

## 2010-10-31 DIAGNOSIS — I712 Thoracic aortic aneurysm, without rupture, unspecified: Secondary | ICD-10-CM

## 2010-11-01 NOTE — Assessment & Plan Note (Signed)
OFFICE VISIT  Eric Beltran, Eric Beltran DOB:  1940/09/11                                        October 31, 2010 CHART #:  [id number]  HISTORY:  The patient returned office today for followup of ascending aortic aneurysm.  I saw him initially on April 25, 2010 at which time CT scan of the chest showed a 5.1-cm fusiform ascending aortic aneurysm. Since I last saw him he has had no chest or back pain.  He denies any shortness of breath.  He does have significant history of coronary artery disease treated with stents and has been followed closely by Dr. Daleen Squibb.  PHYSICAL EXAMINATION:  VITAL SIGNS:  Today his blood pressure is 153/85, pulse is 61 and regular, respiratory rate 16 unlabored.  Oxygen saturation on room air is 96%. GENERAL:  He looks well. CARDIAC:  Regular rate and rhythm with normal S1 and S2.  There is no murmur, rub, or gallop. LUNGS:  Clear.  There is no peripheral edema.  MR angiogram of the chest today shows no significant enlargement of the ascending aortic aneurysm.  The radiologist measure this at 5.15 cm x 5.14 cm.  It was commented on that there may be very slight enlargement but it does not appear any larger than his previous scan.  IMPRESSION:  The patient has a stable 5.1-5.2 cm fusiform ascending aneurysm.  I do not think there is any reason for intervention at this time.  He does have a significant coronary history and possibly he may need coronary bypass at some point at which time the aneurysm would have to be dealt with.  I will plan to repeat his MR angiogram in about 1 year.  I told him I would not to plan on intervening unless he develops progressive increase in size about 5.5 cm or greater.  I stressed the importance of good blood pressure control and strongly recommended continue to taking his blood pressure medicine as prescribed.  He will continue to follow up with Dr. Daleen Squibb as well as primary physician Dr. Juanetta Gosling and I will  plan to see him back for 1 year.  Evelene Croon, M.D. Electronically Signed  BB/MEDQ  D:  10/31/2010  T:  11/01/2010  Job:  [id number]  cc:   Thomas C. Daleen Squibb, MD, [facility] L. Juanetta Gosling, M.D.

## 2010-11-06 ENCOUNTER — Ambulatory Visit (INDEPENDENT_AMBULATORY_CARE_PROVIDER_SITE_OTHER): Payer: Medicare Other | Admitting: Cardiology

## 2010-11-06 ENCOUNTER — Encounter: Payer: Self-pay | Admitting: Cardiology

## 2010-11-06 DIAGNOSIS — I714 Abdominal aortic aneurysm, without rupture, unspecified: Secondary | ICD-10-CM

## 2010-11-06 DIAGNOSIS — E785 Hyperlipidemia, unspecified: Secondary | ICD-10-CM

## 2010-11-06 DIAGNOSIS — I2510 Atherosclerotic heart disease of native coronary artery without angina pectoris: Secondary | ICD-10-CM

## 2010-11-06 NOTE — Assessment & Plan Note (Signed)
We'll check fasting lipids and liver function studies in about 5 weeks. He may need to go on Zetia as well. We need to be progressing with his lipids. He concurs

## 2010-11-06 NOTE — Progress Notes (Signed)
HPI Eric Beltran Returns today for evaluation management of his coronary artery disease and mixed hyperlipidemia. He is having no angina. He remains extremely active.  His lipids were not at goal on pravastatin. He has agreed to take atorvastatin 40 mg a day. He is reluctant to take 80 mg a day because of the fear of myalgias.  He has been on atorvastatin 40 mg for about a week. He is being followed by Dr. Lavinia Sharps for a stable 5.1 ascending aortic root aneurysm. He just had an MRA last week.  His blood pressure then a good control. Meds reviewed and he is compliant Past Medical History  Diagnosis Date  . Dyslipidemia   . HTN (hypertension)   . CAD (coronary artery disease)   . Chest pain   . Supraventricular tachycardia     Past Surgical History  Procedure Date  . Cath with stenting     No family history on file.  History   Social History  . Marital Status: Widowed    Spouse Name: N/A    Number of Children: N/A  . Years of Education: N/A   Occupational History  . Not on file.   Social History Main Topics  . Smoking status: Never Smoker   . Smokeless tobacco: Not on file   Comment: tobacco use - no  . Alcohol Use: No  . Drug Use: No  . Sexually Active: Not on file   Other Topics Concern  . Not on file   Social History Narrative   Retired, widower, does not get regular exercise.     No Known Allergies  Current Outpatient Prescriptions  Medication Sig Dispense Refill  . acyclovir (ZOVIRAX) 800 MG tablet Take 800 mg by mouth daily. Takes 1/2 tab daily      . aspirin 325 MG EC tablet Take 325 mg by mouth daily.        [name] atorvastatin (LIPITOR) 40 MG tablet Take 40 mg by mouth daily.        [name] diltiazem (CARDIZEM CD) 240 MG 24 hr capsule Take 240 mg by mouth daily.        . Multiple Vitamin (MULTIVITAMIN) tablet Take 1 tablet by mouth daily.        . Omega-3 Fatty Acids (FISH OIL) 1000 MG CAPS Take 1 capsule by mouth daily.        . ramipril (ALTACE) 10 MG capsule Take  1 capsule (10 mg total) by mouth daily.  90 capsule  0  . tadalafil (CIALIS) 5 MG tablet Take 5 mg by mouth daily as needed. Take 1 tablet 30 min prior to sexual activity.       . Tamsulosin HCl (FLOMAX) 0.4 MG CAPS Take 0.4 mg by mouth daily.        [name] DISCONTD: atorvastatin (LIPITOR) 40 MG tablet Take 1 tablet (40 mg total) by mouth daily.  30 tablet  3  . DISCONTD: diltiazem (DILACOR XR) 240 MG 24 hr capsule Take 1 capsule (240 mg total) by mouth daily.  90 capsule  0  . DISCONTD: pravastatin (PRAVACHOL) 40 MG tablet Take 40 mg by mouth at bedtime.          ROS Negative other than HPI.   PE General Appearance: well developed, well nourished in no acute distress HEENT: symmetrical face, PERRLA, good dentition  Neck: no JVD, thyromegaly, or adenopathy, trachea midline Chest: symmetric without deformity Cardiac: PMI non-displaced, RRR, normal S1, S2, no gallop or murmur Lung: clear to ausculation  and percussion Vascular: all pulses full without bruits  Abdominal: nondistended, nontender, good bowel sounds, no HSM, no bruits Extremities: no cyanosis, clubbing or edema, no sign of DVT, no varicosities  Skin: normal color, no rashes Neuro: alert and oriented x 3, non-focal Pysch: normal affect Filed Vitals:   11/06/10 0938  BP: 134/77  Pulse: 53  Height: 5\' 7"  (1.702 m)  Weight: 182 lb (82.555 kg)  SpO2: 95%    EKG  Labs and Studies Reviewed.   No results found for this basename: WBC, HGB, HCT, MCV, PLT      Chemistry      Component Value Date/Time   NA 137 04/13/2010 1821   K 4.2 04/13/2010 1821   CL 103 04/13/2010 1821   CO2 24 04/13/2010 1821   BUN 19 04/13/2010 1821   CREATININE 0.98 04/13/2010 1821      Component Value Date/Time   CALCIUM 9.3 04/13/2010 1821   ALKPHOS 77 09/29/2010 1147   AST 12 09/29/2010 1147   ALT 15 09/29/2010 1147   BILITOT 0.5 09/29/2010 1147       Lab Results  Component Value Date   CHOL 159 09/29/2010   CHOL 115 03/01/2010   CHOL 113  08/31/2009   Lab Results  Component Value Date   HDL 35* 09/29/2010   HDL 36* 03/01/2010   HDL 35* 08/31/2009   Lab Results  Component Value Date   LDLCALC 99 09/29/2010   LDLCALC 56 03/01/2010   LDLCALC 47 08/31/2009   Lab Results  Component Value Date   TRIG 127 09/29/2010   TRIG 116 03/01/2010   TRIG 155* 08/31/2009   Lab Results  Component Value Date   CHOLHDL 4.5 09/29/2010   CHOLHDL 3.2 Ratio 03/01/2010   CHOLHDL 3.2 Ratio 08/31/2009   No results found for this basename: HGBA1C   Lab Results  Component Value Date   ALT 15 09/29/2010   AST 12 09/29/2010   ALKPHOS 77 09/29/2010   BILITOT 0.5 09/29/2010   No results found for this basename: TSH

## 2010-11-06 NOTE — Patient Instructions (Signed)
Your physician recommends that you return for lab work in: 5 weeks  Your physician recommends that you continue on your current medications as directed. Please refer to the Current Medication list given to you today.  Your physician recommends that you schedule a follow-up appointment in: 1 year

## 2010-11-06 NOTE — Assessment & Plan Note (Signed)
Stable. Continue good blood pressure and lipid control. Follow with Dr. Lavinia Sharps.

## 2010-11-06 NOTE — Assessment & Plan Note (Signed)
Stable. Continued medical therapy.

## 2010-12-22 ENCOUNTER — Other Ambulatory Visit: Payer: Self-pay | Admitting: *Deleted

## 2010-12-22 DIAGNOSIS — E785 Hyperlipidemia, unspecified: Secondary | ICD-10-CM

## 2010-12-22 DIAGNOSIS — I2510 Atherosclerotic heart disease of native coronary artery without angina pectoris: Secondary | ICD-10-CM

## 2010-12-22 LAB — LIPID PANEL
Cholesterol: 117 mg/dL (ref 0–200)
Triglycerides: 85 mg/dL (ref <150)
VLDL: 17 mg/dL (ref 0–40)

## 2010-12-22 LAB — HEPATIC FUNCTION PANEL
ALT: 15 U/L (ref 0–53)
Alkaline Phosphatase: 68 U/L (ref 39–117)
Bilirubin, Direct: 0.1 mg/dL (ref 0.0–0.3)
Indirect Bilirubin: 0.4 mg/dL (ref 0.0–0.9)

## 2010-12-22 NOTE — Progress Notes (Signed)
Addended by: Augusto Gamble on: 12/22/2010 08:43 AM   Modules accepted: Orders

## 2011-01-05 ENCOUNTER — Telehealth: Payer: Self-pay | Admitting: *Deleted

## 2011-01-05 ENCOUNTER — Encounter: Payer: Self-pay | Admitting: *Deleted

## 2011-01-05 NOTE — Telephone Encounter (Signed)
Message copied by Kimberlly Norgard L on Fri Jan 05, 2011  1:26 PM ------      Message from: WALL, [place] C      Created: Mon Dec 25, 2010 12:34 PM       Excellent, no change

## 2011-01-05 NOTE — Telephone Encounter (Signed)
Pt aware of results will mail copy of labs to pt.

## 2011-01-10 ENCOUNTER — Other Ambulatory Visit: Payer: Self-pay | Admitting: *Deleted

## 2011-01-10 MED ORDER — ATORVASTATIN CALCIUM 40 MG PO TABS: 40.0000 mg | ORAL_TABLET | Freq: Every day | ORAL | Status: DC

## 2011-01-10 MED ORDER — RAMIPRIL 10 MG PO CAPS: 10.0000 mg | ORAL_CAPSULE | Freq: Every day | ORAL | Status: DC

## 2011-01-10 MED ORDER — DILTIAZEM HCL ER COATED BEADS 240 MG PO CP24: 240.0000 mg | ORAL_CAPSULE | Freq: Every day | ORAL | Status: DC

## 2011-01-10 MED ORDER — TADALAFIL 5 MG PO TABS: 5.0000 mg | ORAL_TABLET | Freq: Every day | ORAL | Status: DC | PRN

## 2011-04-19 ENCOUNTER — Other Ambulatory Visit: Payer: Self-pay | Admitting: *Deleted

## 2011-04-19 MED ORDER — DILTIAZEM HCL ER COATED BEADS 240 MG PO CP24: 240.0000 mg | ORAL_CAPSULE | Freq: Every day | ORAL | Status: DC

## 2011-04-19 MED ORDER — RAMIPRIL 10 MG PO CAPS: 10.0000 mg | ORAL_CAPSULE | Freq: Every day | ORAL | Status: DC

## 2011-04-19 MED ORDER — ATORVASTATIN CALCIUM 40 MG PO TABS: 40.0000 mg | ORAL_TABLET | Freq: Every day | ORAL | Status: DC

## 2011-09-21 ENCOUNTER — Other Ambulatory Visit: Payer: Self-pay | Admitting: Surgery

## 2011-09-21 DIAGNOSIS — I712 Thoracic aortic aneurysm, without rupture, unspecified: Secondary | ICD-10-CM

## 2011-09-27 ENCOUNTER — Other Ambulatory Visit: Payer: Self-pay | Admitting: Cardiology

## 2011-10-30 ENCOUNTER — Encounter: Payer: Self-pay | Admitting: Cardiology

## 2011-10-30 ENCOUNTER — Ambulatory Visit (INDEPENDENT_AMBULATORY_CARE_PROVIDER_SITE_OTHER): Payer: Medicare Other | Admitting: Cardiology

## 2011-10-30 VITALS — BP 146/83 | HR 62 | Resp 18 | Ht 68.0 in | Wt 186.0 lb

## 2011-10-30 DIAGNOSIS — I319 Disease of pericardium, unspecified: Secondary | ICD-10-CM

## 2011-10-30 DIAGNOSIS — I712 Thoracic aortic aneurysm, without rupture, unspecified: Secondary | ICD-10-CM

## 2011-10-30 DIAGNOSIS — I498 Other specified cardiac arrhythmias: Secondary | ICD-10-CM

## 2011-10-30 DIAGNOSIS — I714 Abdominal aortic aneurysm, without rupture, unspecified: Secondary | ICD-10-CM

## 2011-10-30 DIAGNOSIS — E785 Hyperlipidemia, unspecified: Secondary | ICD-10-CM

## 2011-10-30 DIAGNOSIS — R55 Syncope and collapse: Secondary | ICD-10-CM

## 2011-10-30 DIAGNOSIS — I2510 Atherosclerotic heart disease of native coronary artery without angina pectoris: Secondary | ICD-10-CM

## 2011-10-30 NOTE — Patient Instructions (Addendum)
Your physician recommends that you schedule a follow-up appointment in: 1 year   Your physician recommends that you return for lab work in: August - You will receive a reminder letter  Follow up with Dr Laneta Simmers next week and plan of care will be discussed at that time

## 2011-10-30 NOTE — Progress Notes (Signed)
HPI Eric Beltran comes in today for evaluation of some increased dyspnea on exertion and chest tightness. He notes it when he does climb steps or dances. He has a history of coronary disease and intervention. He does have some smaller vessel disease. He has good LV function.  He is seeing Eric Beltran next week for his aortic aneurysm assessment. His last MRI a year ago showed it to be 5.1 cm.  He is compliant with medications. His last lipid panel was markedly improved after going back on Lipitor.  Past Medical History  Diagnosis Date  . Dyslipidemia   . HTN (hypertension)   . CAD (coronary artery disease)   . Chest pain   . Supraventricular tachycardia     Current Outpatient Prescriptions  Medication Sig Dispense Refill  . acyclovir (ZOVIRAX) 800 MG tablet Take 800 mg by mouth daily. Takes 1/2 tab daily      . aspirin 325 MG EC tablet Take 325 mg by mouth daily.        . atorvastatin (LIPITOR) 40 MG tablet Take 1 tablet (40 mg total) by mouth daily.  90 tablet  3  . CIALIS 5 MG tablet TAKE ONE TABLET BY MOUTH 30 MINUTES PRIOR  TO  SEXUAL  ACTIVITY  30 each  5  . diltiazem (CARDIZEM CD) 240 MG 24 hr capsule Take 1 capsule (240 mg total) by mouth daily.  90 capsule  3  . Multiple Vitamin (MULTIVITAMIN) tablet Take 1 tablet by mouth daily.        . Omega-3 Fatty Acids (FISH OIL) 1000 MG CAPS Take 1 capsule by mouth daily.        . ramipril (ALTACE) 10 MG capsule Take 1 capsule (10 mg total) by mouth daily.  90 capsule  3  . Tamsulosin HCl (FLOMAX) 0.4 MG CAPS Take 0.4 mg by mouth daily.          No Known Allergies  No family history on file.  History   Social History  . Marital Status: Widowed    Spouse Name: N/A    Number of Children: N/A  . Years of Education: N/A   Occupational History  . Not on file.   Social History Main Topics  . Smoking status: Never Smoker   . Smokeless tobacco: Not on file   Comment: tobacco use - no  . Alcohol Use: No  . Drug Use: No  . Sexually  Active: Not on file   Other Topics Concern  . Not on file   Social History Narrative   Retired, widower, does not get regular exercise.     ROS ALL NEGATIVE EXCEPT THOSE NOTED IN HPI  PE  General Appearance: well developed, well nourished in no acute distress HEENT: symmetrical face, PERRLA, good dentition  Neck: no JVD, thyromegaly, or adenopathy, trachea midline Chest: symmetric without deformity Cardiac: PMI non-displaced, RRR, normal S1, S2, no gallop or murmur Lung: clear to ausculation and percussion Vascular: all pulses full without bruits  Abdominal: nondistended, nontender, good bowel sounds, no HSM, no bruits Extremities: no cyanosis, clubbing or edema, no sign of DVT, no varicosities  Skin: normal color, no rashes Neuro: alert and oriented x 3, non-focal Pysch: normal affect  EKG Normal sinus rhythm, 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 BMET    Component Value Date/Time   NA 137 04/13/2010 1821   K 4.2 04/13/2010 1821   CL 103 04/13/2010 1821   CO2 24 04/13/2010 1821   GLUCOSE 89 04/13/2010   1821   BUN 19 04/13/2010 1821   CREATININE 0.98 04/13/2010 1821   CALCIUM 9.3 04/13/2010 1821    Lipid Panel     Component Value Date/Time   CHOL 117 12/22/2010 0844   TRIG 85 12/22/2010 0844   HDL 37* 12/22/2010 0844   CHOLHDL 3.2 12/22/2010 0844   VLDL 17 12/22/2010 0844   LDLCALC 63 12/22/2010 0844    CBC No results found for this basename: wbc, rbc, hgb, hct, plt, mcv, mch, mchc, rdw, neutrabs, lymphsabs, monoabs, eosabs, basosabs      

## 2011-10-30 NOTE — Assessment & Plan Note (Signed)
No longer an active problem. This was from dehydration orthostatic hypotension.

## 2011-10-30 NOTE — Assessment & Plan Note (Signed)
He will have this evaluated with Dr. Laneta Simmers next week. It may be time to consider cardiac catheterization with his increased dyspnea and some chest tightness. He may need both bypass surgery with aortic valve and aortic root replacement. We spent a lot of time talking about this today. I'll wait to hear from Dr. Laneta Simmers.

## 2011-10-30 NOTE — Assessment & Plan Note (Addendum)
He has increased dyspnea on exertion and some chest tightness. Probably will need a cardiac catheterization. We'll try to stage this with Dr. Sharee Pimple assessment of his ascending aortic aneurysm.

## 2011-10-30 NOTE — Assessment & Plan Note (Signed)
Much improved with Lipitor. Recheck labs in August.

## 2011-10-30 NOTE — Assessment & Plan Note (Signed)
Most recent CT of the abdomen showed atherosclerosis but no aneurysm.

## 2011-11-05 ENCOUNTER — Ambulatory Visit
Admission: RE | Admit: 2011-11-05 | Discharge: 2011-11-05 | Disposition: A | Discharge status: Home / Self Care | Payer: Medicare Other | Source: Ambulatory Visit | Attending: Surgery | Admitting: Surgery

## 2011-11-05 DIAGNOSIS — I712 Thoracic aortic aneurysm, without rupture, unspecified: Secondary | ICD-10-CM

## 2011-11-05 MED ORDER — GADOBENATE DIMEGLUMINE 529 MG/ML IV SOLN: 17.0000 mL | Freq: Once | INTRAVENOUS | Status: AC | PRN

## 2011-11-06 ENCOUNTER — Ambulatory Visit (INDEPENDENT_AMBULATORY_CARE_PROVIDER_SITE_OTHER): Payer: Medicare Other | Admitting: Surgery

## 2011-11-06 ENCOUNTER — Encounter: Payer: Self-pay | Admitting: Surgery

## 2011-11-06 VITALS — BP 148/86 | HR 74 | Resp 16 | Ht 68.0 in | Wt 182.0 lb

## 2011-11-06 DIAGNOSIS — I712 Thoracic aortic aneurysm, without rupture, unspecified: Secondary | ICD-10-CM

## 2011-11-06 NOTE — Progress Notes (Signed)
[address]            Eric Beltran [address]          [phone]      HPI:  The patient returns today for followup of his ascending aortic aneurysm. I last saw him on 10/31/2010 at which time a MR angiogram showed the maximum diameter to be 5.15 x 5.14 cm. This was stable compared to his initial CT scan on 04/25/2010. He also has a significant history of coronary disease treated with stents and has been followed closely by Dr. Daleen Squibb. Over the past few months he said that he has had a few episodes of substernal chest discomfort occurring while dancing vigorously with his wife. He has not had any episodes with normal daily activity or at rest.  Current Outpatient Prescriptions  Medication Sig Dispense Refill  . acyclovir (ZOVIRAX) 800 MG tablet Take 800 mg by mouth daily. Takes 1/2 tab daily      . aspirin 325 MG EC tablet Take 325 mg by mouth daily.        [name] atorvastatin (LIPITOR) 40 MG tablet Take 1 tablet (40 mg total) by mouth daily.  90 tablet  3  . CIALIS 5 MG tablet TAKE ONE TABLET BY MOUTH 30 MINUTES PRIOR  TO  SEXUAL  ACTIVITY  30 each  5  . diltiazem (CARDIZEM CD) 240 MG 24 hr capsule Take 1 capsule (240 mg total) by mouth daily.  90 capsule  3  . Multiple Vitamin (MULTIVITAMIN) tablet Take 1 tablet by mouth daily.        . Omega-3 Fatty Acids (FISH OIL) 1000 MG CAPS Take 1 capsule by mouth daily.        . ramipril (ALTACE) 10 MG capsule Take 1 capsule (10 mg total) by mouth daily.  90 capsule  3  . Tamsulosin HCl (FLOMAX) 0.4 MG CAPS Take 0.4 mg by mouth daily.         No current facility-administered medications for this visit.   Facility-Administered Medications Ordered in Other Visits  Medication Dose Route Frequency Provider Last Rate Last Dose  . gadobenate dimeglumine (MULTIHANCE) injection 17 mL  17 mL Intravenous Once PRN Medication Radiologist, MD         Physical Exam: BP 148/86  Pulse 74  Resp 16  Ht 5\' 8"  (1.727 m)  Wt 182 lb  (82.555 kg)  BMI 27.67 kg/m2  SpO2 94% He looks well. Cardiac exam shows a regular rate and rhythm with normal heart sounds. There is no murmur, rub, or gallop.  Diagnostic Tests:  Clinical Data: Evaluate thoracic aorta aneurysm.   MRA CHEST WITH OR WITHOUT CONTRAST   Contrast:  17 ml Multihance   BUN and creatinine were obtained on site at [facility] at [address] Results:  BUN 13 mg/dL,  Creatinine 1.0 mg/dL.   Comparison: Chest MRA 10/31/2010   Findings: Again seen is a small amount of pericardial fluid which appears to be grossly similar to the prior examination.  Again noted is a trace amount of pleural fluid which is also similar to the prior examination.  Images of the upper abdomen are unremarkable.  The main pulmonary arteries are patent.  Thoracic aorta is patent without dissection.  The ascending thoracic aorta is unchanged in size and measures up to 5.2 cm.  Descending thoracic aorta is unchanged in size  and measures up to 3 cm.  There is flow in the proximal great vessels. Again noted is a small amount of plaque along the left side of the aortic arch.   IMPRESSION: Stable aneurysmal dilatation of the ascending thoracic aorta measuring up to 5.2 cm.  No acute findings.   Original Report Authenticated By: Richarda Overlie, M.D.   Impression:  He has a stable 5.2 cm fusiform ascending aortic aneurysm that is still slightly below the surgical threshold. Dr. Daleen Squibb is considering repeat cardiac catheterization to evaluate his chest discomfort symptoms and if he were to require coronary bypass surgery then I would perform aneurysm repair. If the decision is made not to perform cardiac catheterization or to continue medical therapy then I would recommend reevaluating his aneurysm in 1 year with an MRA.  Plan:  I will see him back whenever Dr. Daleen Squibb feels it is necessary. Otherwise I will plan to see him in one year with an MRA of the chest.

## 2011-11-12 ENCOUNTER — Telehealth: Payer: Self-pay

## 2011-11-12 ENCOUNTER — Other Ambulatory Visit: Payer: Self-pay | Admitting: Adult Health

## 2011-11-12 DIAGNOSIS — Z7901 Long term (current) use of anticoagulants: Secondary | ICD-10-CM

## 2011-11-12 DIAGNOSIS — Z01818 Encounter for other preprocedural examination: Secondary | ICD-10-CM

## 2011-11-12 DIAGNOSIS — R079 Chest pain, unspecified: Secondary | ICD-10-CM

## 2011-11-12 NOTE — Telephone Encounter (Signed)
**Note De-Identified  Obfuscation** Pt. advised that his cath is scheduled for November 14, 2011 at 7:30 with Dr. [place] at [facility] cath lab, he verbalized understanding. Pt. States he will come by office in the morning to receive lab orders, CXR and cath instructions./LV

## 2011-11-13 ENCOUNTER — Other Ambulatory Visit: Payer: Self-pay | Admitting: Adult Health

## 2011-11-13 ENCOUNTER — Ambulatory Visit (HOSPITAL_COMMUNITY)
Admission: RE | Admit: 2011-11-13 | Discharge: 2011-11-13 | Disposition: A | Discharge status: Home / Self Care | Payer: Medicare Other | Source: Ambulatory Visit | Attending: Adult Health | Admitting: Adult Health

## 2011-11-13 DIAGNOSIS — R079 Chest pain, unspecified: Secondary | ICD-10-CM

## 2011-11-13 DIAGNOSIS — I517 Cardiomegaly: Secondary | ICD-10-CM | POA: Insufficient documentation

## 2011-11-13 DIAGNOSIS — Z01818 Encounter for other preprocedural examination: Secondary | ICD-10-CM | POA: Insufficient documentation

## 2011-11-13 LAB — CBC WITH DIFFERENTIAL/PLATELET
Basophils Absolute: 0.1 10*3/uL (ref 0.0–0.1)
Eosinophils Absolute: 0.1 10*3/uL (ref 0.0–0.7)
Eosinophils Relative: 1 % (ref 0–5)
MCH: 28.2 pg (ref 26.0–34.0)
MCV: 84 fL (ref 78.0–100.0)
Neutrophils Relative %: 66 % (ref 43–77)
Platelets: 265 10*3/uL (ref 150–400)
RDW: 15 % (ref 11.5–15.5)
WBC: 8.1 10*3/uL (ref 4.0–10.5)

## 2011-11-13 LAB — BASIC METABOLIC PANEL
BUN: 17 mg/dL (ref 6–23)
Creat: 0.96 mg/dL (ref 0.50–1.35)
Glucose, Bld: 105 mg/dL — ABNORMAL HIGH (ref 70–99)
Potassium: 4.2 mEq/L (ref 3.5–5.3)

## 2011-11-14 ENCOUNTER — Encounter (HOSPITAL_BASED_OUTPATIENT_CLINIC_OR_DEPARTMENT_OTHER)
Admission: RE | Disposition: A | Discharge status: Home / Self Care | Payer: Self-pay | Source: Ambulatory Visit | Attending: Cardiology

## 2011-11-14 ENCOUNTER — Inpatient Hospital Stay (HOSPITAL_BASED_OUTPATIENT_CLINIC_OR_DEPARTMENT_OTHER)
Admission: RE | Admit: 2011-11-14 | Discharge: 2011-11-14 | Disposition: A | Discharge status: Home / Self Care | Payer: Medicare Other | Source: Ambulatory Visit | Attending: Cardiology | Admitting: Cardiology

## 2011-11-14 DIAGNOSIS — I2510 Atherosclerotic heart disease of native coronary artery without angina pectoris: Secondary | ICD-10-CM | POA: Insufficient documentation

## 2011-11-14 DIAGNOSIS — I209 Angina pectoris, unspecified: Secondary | ICD-10-CM | POA: Insufficient documentation

## 2011-11-14 DIAGNOSIS — R079 Chest pain, unspecified: Secondary | ICD-10-CM

## 2011-11-14 DIAGNOSIS — Z9861 Coronary angioplasty status: Secondary | ICD-10-CM | POA: Insufficient documentation

## 2011-11-14 DIAGNOSIS — I10 Essential (primary) hypertension: Secondary | ICD-10-CM | POA: Insufficient documentation

## 2011-11-14 DIAGNOSIS — E785 Hyperlipidemia, unspecified: Secondary | ICD-10-CM | POA: Insufficient documentation

## 2011-11-14 SURGERY — JV LEFT HEART CATHETERIZATION WITH CORONARY ANGIOGRAM
Anesthesia: Moderate Sedation

## 2011-11-14 MED ORDER — SODIUM CHLORIDE 0.9 % IJ SOLN: 3.0000 mL | INTRAMUSCULAR | Status: DC | PRN

## 2011-11-14 MED ORDER — SODIUM CHLORIDE 0.9 % IV SOLN: 1.0000 mL/kg/h | INTRAVENOUS | Status: DC

## 2011-11-14 MED ORDER — SODIUM CHLORIDE 0.9 % IV SOLN: 250.0000 mL | INTRAVENOUS | Status: DC | PRN

## 2011-11-14 MED ORDER — SODIUM CHLORIDE 0.9 % IJ SOLN: 3.0000 mL | Freq: Two times a day (BID) | INTRAMUSCULAR | Status: DC

## 2011-11-14 MED ORDER — ONDANSETRON HCL 4 MG/2ML IJ SOLN: 4.0000 mg | Freq: Four times a day (QID) | INTRAMUSCULAR | Status: DC | PRN

## 2011-11-14 MED ORDER — ACETAMINOPHEN 325 MG PO TABS: 650.0000 mg | ORAL_TABLET | ORAL | Status: DC | PRN

## 2011-11-14 MED ORDER — ASPIRIN 81 MG PO CHEW
324.0000 mg | CHEWABLE_TABLET | ORAL | Status: AC
  Administered 2011-11-14: 324 mg via ORAL

## 2011-11-14 NOTE — OR Nursing (Signed)
Discharge instructions reviewed and signed, pt stated understanding, ambulated in hall without difficulty, site level 0, transported to friend's car via wheelchair 

## 2011-11-14 NOTE — Interval H&P Note (Signed)
History and Physical Interval Note:  11/14/2011 9:50 AM  Eric Beltran  has presented today for surgery, with the diagnosis of CAD  The various methods of treatment have been discussed with the patient and family. After consideration of risks, benefits and other options for treatment, the patient has consented to  Procedure(s) (LRB): JV LEFT HEART CATHETERIZATION WITH CORONARY ANGIOGRAM (N/A) as a surgical intervention .  The patient's history has been reviewed, patient examined, no change in status, stable for surgery.  I have reviewed the patients' chart and labs.  Questions were answered to the patient's satisfaction.     Theron Arista [facility] 11/14/2011 9:50 AM

## 2011-11-14 NOTE — OR Nursing (Signed)
Dr Jordan at bedside to discuss results and treatment plan with pt and family 

## 2011-11-14 NOTE — OR Nursing (Signed)
Tegaderm dressing applied, site level 0, bedrest begins at 1030 

## 2011-11-14 NOTE — H&P (View-Only) (Signed)
HPI Eric Beltran comes in today for evaluation of some increased dyspnea on exertion and chest tightness. He notes it when he does climb steps or dances. He has a history of coronary disease and intervention. He does have some smaller vessel disease. He has good LV function.  He is seeing Dr. Laneta Simmers next week for his aortic aneurysm assessment. His last MRI a year ago showed it to be 5.1 cm.  He is compliant with medications. His last lipid panel was markedly improved after going back on Lipitor.  Past Medical History  Diagnosis Date  . Dyslipidemia   . HTN (hypertension)   . CAD (coronary artery disease)   . Chest pain   . Supraventricular tachycardia     Current Outpatient Prescriptions  Medication Sig Dispense Refill  . acyclovir (ZOVIRAX) 800 MG tablet Take 800 mg by mouth daily. Takes 1/2 tab daily      . aspirin 325 MG EC tablet Take 325 mg by mouth daily.        [name] atorvastatin (LIPITOR) 40 MG tablet Take 1 tablet (40 mg total) by mouth daily.  90 tablet  3  . CIALIS 5 MG tablet TAKE ONE TABLET BY MOUTH 30 MINUTES PRIOR  TO  SEXUAL  ACTIVITY  30 each  5  . diltiazem (CARDIZEM CD) 240 MG 24 hr capsule Take 1 capsule (240 mg total) by mouth daily.  90 capsule  3  . Multiple Vitamin (MULTIVITAMIN) tablet Take 1 tablet by mouth daily.        . Omega-3 Fatty Acids (FISH OIL) 1000 MG CAPS Take 1 capsule by mouth daily.        . ramipril (ALTACE) 10 MG capsule Take 1 capsule (10 mg total) by mouth daily.  90 capsule  3  . Tamsulosin HCl (FLOMAX) 0.4 MG CAPS Take 0.4 mg by mouth daily.          No Known Allergies  No family history on file.  History   Social History  . Marital Status: Widowed    Spouse Name: N/A    Number of Children: N/A  . Years of Education: N/A   Occupational History  . Not on file.   Social History Main Topics  . Smoking status: Never Smoker   . Smokeless tobacco: Not on file   Comment: tobacco use - no  . Alcohol Use: No  . Drug Use: No  . Sexually  Active: Not on file   Other Topics Concern  . Not on file   Social History Narrative   Retired, widower, does not get regular exercise.     ROS ALL NEGATIVE EXCEPT THOSE NOTED IN HPI  PE  General Appearance: well developed, well nourished in no acute distress HEENT: symmetrical face, PERRLA, good dentition  Neck: no JVD, thyromegaly, or adenopathy, trachea midline Chest: symmetric without deformity Cardiac: PMI non-displaced, RRR, normal S1, S2, no gallop or murmur Lung: clear to ausculation and percussion Vascular: all pulses full without bruits  Abdominal: nondistended, nontender, good bowel sounds, no HSM, no bruits Extremities: no cyanosis, clubbing or edema, no sign of DVT, no varicosities  Skin: normal color, no rashes Neuro: alert and oriented x 3, non-focal Pysch: normal affect  EKG Normal sinus rhythm, old inferior [place] infarct, no acute changes. BMET    Component Value Date/Time   NA 137 04/13/2010 1821   K 4.2 04/13/2010 1821   CL 103 04/13/2010 1821   CO2 24 04/13/2010 1821   GLUCOSE 89 04/13/2010  1821   BUN 19 04/13/2010 1821   CREATININE 0.98 04/13/2010 1821   CALCIUM 9.3 04/13/2010 1821    Lipid Panel     Component Value Date/Time   CHOL 117 12/22/2010 0844   TRIG 85 12/22/2010 0844   HDL 37* 12/22/2010 0844   CHOLHDL 3.2 12/22/2010 0844   VLDL 17 12/22/2010 0844   LDLCALC 63 12/22/2010 0844    CBC No results found for this basename: wbc, rbc, hgb, hct, plt, mcv, mch, mchc, rdw, neutrabs, lymphsabs, monoabs, eosabs, basosabs

## 2011-11-14 NOTE — CV Procedure (Signed)
   Cardiac Catheterization Procedure Note  Name: Eric Beltran MRN: [id number] DOB: 04/12/1941  Procedure: Left Heart Cath, Selective Coronary Angiography, LV angiography  Indication: 71 year old white male with history of coronary disease status post stenting of the intermediate branch in 2004. Now presents with symptoms of increased angina on exertion. He also has a history of an asymptomatic aortic aneurysm at 5.2 cm.   Procedural details: The right groin was prepped, draped, and anesthetized with 1% lidocaine. Using modified Seldinger technique, a 5 French sheath was introduced into the right femoral artery. Standard Judkins catheters were used for coronary angiography and left ventriculography. A 5 cm left Judkins catheter was used to engage the left coronary. Catheter exchanges were performed over a guidewire. There were no immediate procedural complications. The patient was transferred to the post catheterization recovery area for further monitoring.  Procedural Findings: Hemodynamics:  AO 126/65 with a mean of 89 mmHg LV 125/24 mmHg. There is no significant aortic valve gradient by pullback.   Coronary angiography: Coronary dominance: right  Left mainstem: There is 30% distal left main stenosis.  Left anterior descending (LAD): The LAD is a large vessel with an 80-90% stenosis in the proximal vessel.  There is a large ramus intermediate branch which is occluded proximally at the site of a prior stent. This branch fills by left to left collaterals.  Left circumflex (LCx): The left circumflex gives rise to a single marginal branch. It has a 99% stenosis proximally. There is TIMI grade 2 flow down the vessel.  Right coronary artery (RCA): The right coronary is a dominant vessel. It is moderately calcified throughout. In the mid vessel there is diffuse 30-40% disease. The first posterior lateral branch has a 70% stenosis.  Left ventriculography: Left ventricular systolic function  is normal, LVEF is estimated at 55-65%, there is no significant mitral regurgitation   Final Conclusions:   1. Severe 3 vessel obstructive coronary disease. The prior stent in the intermediate branch is occluded with collaterals. He now has critical disease at the origin of the left circumflex and severe disease in the proximal LAD. There is moderate disease in the distal right coronary territory. 2. Normal left ventricle function.   Recommendations:  Recommend reevaluation by Dr. Laneta Simmers for combined coronary bypass surgery and aortic aneurysm repair.   Peter JordanMD,FACC7/07/2011, 10:20 AM

## 2011-11-28 ENCOUNTER — Encounter: Payer: Medicare Other | Admitting: Surgery

## 2011-11-30 ENCOUNTER — Encounter: Payer: Medicare Other | Admitting: Surgery

## 2012-01-07 ENCOUNTER — Telehealth: Payer: Self-pay | Admitting: *Deleted

## 2012-01-07 NOTE — Telephone Encounter (Signed)
I spoke with pt this morning regarding appointment post hospital. Pt had CABG & ascending AA replacement on 12/13/11 at [facility] with Dr. Remo Lipps.  He followed up with Dr. Remo Lipps around a week ago. He has another appt with Dr. Remo Lipps the first of October.  States he is doing well. He is driving now.  "I had the greatest experience at [facility]. My daughter took care of me 4 days after I was discharged ... Then my girlfriend." Pt has not followed up with cardiologist. He wishes to start Cardiac Rehab at [facility] in about 3 weeks. He does only wants to see Dr. Daleen Squibb.  He does not want to come to [place].  I will forward to Dr. Lonia Chimera RN

## 2012-01-09 NOTE — Telephone Encounter (Signed)
LMTCB Debbie Arion Morgan RN  

## 2012-01-17 DIAGNOSIS — D481 Neoplasm of uncertain behavior of connective and other soft tissue: Secondary | ICD-10-CM | POA: Insufficient documentation

## 2012-01-17 DIAGNOSIS — L570 Actinic keratosis: Secondary | ICD-10-CM | POA: Insufficient documentation

## 2012-01-18 ENCOUNTER — Telehealth: Payer: Self-pay | Admitting: Cardiology

## 2012-01-18 NOTE — Telephone Encounter (Signed)
F/U TO CALL HE RECEIVED YESTERDAY.  HE IS CALLING BACK.

## 2012-01-18 NOTE — Telephone Encounter (Signed)
LMOVM Pt needs appointment with either [facility] in [place] this month or with a PA here in the [employer].  He has had recent bypass surgery & has followed up with his surgeon at [facility].  Pt wants to start cardiac rehab at [facility] in a couple of weeks. Dr. Daleen Squibb is aware and these were his recommendations as he will not be in the [place] office until November.   Mylo Red RN

## 2012-01-21 NOTE — Telephone Encounter (Signed)
Called [place] office and spoke with terri--advised pt needs post surgical appoint with Natalia Leatherwood PA in [place] --terri stated she would call pt and set up appoint

## 2012-01-28 ENCOUNTER — Ambulatory Visit (INDEPENDENT_AMBULATORY_CARE_PROVIDER_SITE_OTHER): Payer: Medicare Other | Admitting: Adult Health

## 2012-01-28 ENCOUNTER — Encounter: Payer: Self-pay | Admitting: Adult Health

## 2012-01-28 VITALS — BP 130/66 | HR 57 | Ht 68.0 in | Wt 180.0 lb

## 2012-01-28 DIAGNOSIS — I712 Thoracic aortic aneurysm, without rupture, unspecified: Secondary | ICD-10-CM

## 2012-01-28 DIAGNOSIS — I2584 Coronary atherosclerosis due to calcified coronary lesion: Secondary | ICD-10-CM

## 2012-01-28 DIAGNOSIS — I2510 Atherosclerotic heart disease of native coronary artery without angina pectoris: Secondary | ICD-10-CM

## 2012-01-28 MED ORDER — DILTIAZEM HCL ER COATED BEADS 240 MG PO CP24: 240.0000 mg | ORAL_CAPSULE | Freq: Every day | ORAL | Status: DC

## 2012-01-28 MED ORDER — NITROGLYCERIN 0.4 MG SL SUBL: 0.4000 mg | SUBLINGUAL_TABLET | SUBLINGUAL | Status: DC | PRN

## 2012-01-28 NOTE — Patient Instructions (Addendum)
Change Diltiazem to 240 mg daily  NTG use under tongue if needed for chest pain  Cardiac Rehab   Your physician recommends that you schedule a follow-up appointment in: 6 weeks with Dr.Wall

## 2012-01-28 NOTE — Progress Notes (Signed)
HPI: Eric Beltran is a 71 year old male patient of Dr. Elijah Birk wall we are following after hospitalization and coronary artery bypass grafting X4,  with aortic aneurysm repair at [facility] on 12/13/2011 by Dr. Pat Patrick M.D.(LIMA to LAD, reverse saphenous vein graft to posterior lateral branch of the right coronary artery, reverse saphenous vein graft to ramus intermedius, and reverse saphenous vein graft to obtuse marginal). Replacement of ascending aorta with a 26 mm woven Dacron graft, implant #[id number] by [employer]. The patient tolerated the procedure well but did have postoperative atrial fibrillation. He was given diltiazem and returned to normal sinus rhythm. His diltiazem is still in divided doses of 60 mg 4 times a day at this time. Patient states he has felt better than he has felt in a very long time, is anxious to return to his usual activities, and is in agreement to go to cardiac rehabilitation. He is without complaints of chest pain, dyspnea on exertion, dizziness, or episodes of rapid heart rhythm. Most recent labs were drawn 11/12/2011, with a hemoglobin of 14.8 hematocrit 44.1, potassium was 4.2, creatinine 0.96. He is due to followup with Dr. Remo Lipps on October 3 to be released from surgical standpoint. .No Known Allergies  Current Outpatient Prescriptions  Medication Sig Dispense Refill  . acyclovir (ZOVIRAX) 800 MG tablet Take 800 mg by mouth daily. Takes 1/2 tab daily      . aspirin 81 MG tablet Take 81 mg by mouth daily.      [name] atorvastatin (LIPITOR) 40 MG tablet Take 1 tablet (40 mg total) by mouth daily.  90 tablet  3  . CIALIS 5 MG tablet TAKE ONE TABLET BY MOUTH 30 MINUTES PRIOR  TO  SEXUAL  ACTIVITY  30 each  5  . Multiple Vitamin (MULTIVITAMIN) tablet Take 1 tablet by mouth daily.        . Omega-3 Fatty Acids (FISH OIL) 1000 MG CAPS Take 1 capsule by mouth daily.        . ramipril (ALTACE) 10 MG capsule Take 1 capsule (10 mg total) by mouth daily.  90  capsule  3  . Tamsulosin HCl (FLOMAX) 0.4 MG CAPS Take 0.4 mg by mouth daily.        [name] diltiazem (CARDIZEM CD) 240 MG 24 hr capsule Take 1 capsule (240 mg total) by mouth daily.  90 capsule  3  . nitroGLYCERIN (NITROSTAT) 0.4 MG SL tablet Place 1 tablet (0.4 mg total) under the tongue every 5 (five) minutes as needed for chest pain.  90 tablet  3    Past Medical History  Diagnosis Date  . Dyslipidemia   . HTN (hypertension)   . CAD (coronary artery disease)   . Chest pain   . Supraventricular tachycardia     Past Surgical History  Procedure Date  . Cath with stenting     VWU:JWJXBJ of systems complete and found to be negative unless listed above  PHYSICAL EXAM BP 130/66  Pulse 57  Ht 5\' 8"  (1.727 m)  Wt 180 lb (81.647 kg)  BMI 27.37 kg/m2 General: Well developed, well nourished, in no acute distress Head: Eyes PERRLA, No xanthomas.   Normal cephalic and atramatic  Lungs: Clear bilaterally to auscultation and percussion. Heart: HRRR S1 S2, without  MRG distant heart sounds..  Pulses are 2+ & equal.            No carotid bruit. No JVD.  No abdominal bruits. No femoral bruits. Abdomen: Bowel  sounds are positive, abdomen soft and non-tender without masses or                  Hernia's noted. Msk:  Back normal, normal gait. Normal strength and tone for age. Well healed sternotomy scar. Extremities: No clubbing, cyanosis, 1+ ankle edema.  DP +1 Neuro: Alert and oriented X 3. Psych:  Good affect, responds appropriately   EKG: Sinus bradycardia with 1st degree AV block, PR interval 210 ms. Inferior infarct.  ASSESSMENT AND PLAN

## 2012-01-28 NOTE — Assessment & Plan Note (Signed)
The patient is doing very well status post bypass grafting and aortic aneurysm repair. He is anxious to return to his usual activities. I have advised him to be enrolled in cardiac rehabilitation. He is willing to do so. He states he has been feeling much better and would like to get his strength back. He is medically compliant. Without complaints other than soreness at the sternotomy site. He will followup with Dr. Elijah Birk wall in one month to 6 weeks after enrollment in cardiac rehabilitation for reassessment and continued treatment.  I will change his Cardizem 60 mg 4 times a day to long-acting Cardizem 240 mg daily. Also provided him with a prescription for nitroglycerin sublingual. He knows to call us if he experiences rapid heart rhythm, significant chest pain, or difficulty breathing.

## 2012-02-03 ENCOUNTER — Emergency Department (HOSPITAL_COMMUNITY): Payer: Medicare Other

## 2012-02-03 ENCOUNTER — Encounter (HOSPITAL_COMMUNITY): Payer: Self-pay

## 2012-02-03 ENCOUNTER — Emergency Department (HOSPITAL_COMMUNITY)
Admission: EM | Admit: 2012-02-03 | Discharge: 2012-02-04 | Disposition: A | Discharge status: Home / Self Care | Payer: Medicare Other | Attending: Emergency Medicine | Admitting: Emergency Medicine

## 2012-02-03 DIAGNOSIS — Z951 Presence of aortocoronary bypass graft: Secondary | ICD-10-CM | POA: Insufficient documentation

## 2012-02-03 DIAGNOSIS — I471 Supraventricular tachycardia: Secondary | ICD-10-CM

## 2012-02-03 DIAGNOSIS — I498 Other specified cardiac arrhythmias: Secondary | ICD-10-CM | POA: Insufficient documentation

## 2012-02-03 DIAGNOSIS — Z7982 Long term (current) use of aspirin: Secondary | ICD-10-CM | POA: Insufficient documentation

## 2012-02-03 DIAGNOSIS — I10 Essential (primary) hypertension: Secondary | ICD-10-CM | POA: Insufficient documentation

## 2012-02-03 DIAGNOSIS — I2510 Atherosclerotic heart disease of native coronary artery without angina pectoris: Secondary | ICD-10-CM | POA: Insufficient documentation

## 2012-02-03 HISTORY — DX: Benign prostatic hyperplasia without lower urinary tract symptoms: N40.0

## 2012-02-03 LAB — CBC WITH DIFFERENTIAL/PLATELET
Basophils Absolute: 0.1 10*3/uL (ref 0.0–0.1)
HCT: 38.7 % — ABNORMAL LOW (ref 39.0–52.0)
Lymphocytes Relative: 21 % (ref 12–46)
Lymphs Abs: 2.2 10*3/uL (ref 0.7–4.0)
MCV: 83.8 fL (ref 78.0–100.0)
Monocytes Absolute: 0.9 10*3/uL (ref 0.1–1.0)
Neutro Abs: 6.8 10*3/uL (ref 1.7–7.7)
Platelets: 376 10*3/uL (ref 150–400)
RBC: 4.62 MIL/uL (ref 4.22–5.81)
RDW: 14.4 % (ref 11.5–15.5)
WBC: 10.7 10*3/uL — ABNORMAL HIGH (ref 4.0–10.5)

## 2012-02-03 LAB — D-DIMER, QUANTITATIVE: D-Dimer, Quant: 1.74 ug/mL-FEU — ABNORMAL HIGH (ref 0.00–0.48)

## 2012-02-03 LAB — BASIC METABOLIC PANEL
CO2: 23 mEq/L (ref 19–32)
Chloride: 104 mEq/L (ref 96–112)
Glucose, Bld: 161 mg/dL — ABNORMAL HIGH (ref 70–99)
Sodium: 139 mEq/L (ref 135–145)

## 2012-02-03 LAB — TROPONIN I: Troponin I: <0.3 ng/mL (ref <0.30)

## 2012-02-03 MED ORDER — DILTIAZEM HCL 100 MG IV SOLR
5.0000 mg/h | INTRAVENOUS | Status: DC
  Filled 2012-02-03: qty 100

## 2012-02-03 MED ORDER — DILTIAZEM HCL 50 MG/10ML IV SOLN
10.0000 mg | Freq: Once | INTRAVENOUS | Status: DC
  Filled 2012-02-03: qty 2

## 2012-02-03 NOTE — ED Provider Notes (Signed)
History     CSN: [id number]  Arrival date & time 02/03/12  2036   First MD Initiated Contact with Patient 02/03/12 2050      Chief Complaint  Patient presents with  . Tachycardia    (Consider location/radiation/quality/duration/timing/severity/associated sxs/prior treatment) HPI....abrupt onset of tachycardia tonight. This happened in the past but seems to been corrected by bypass surgery on July 30. No chest pain or shortness of breath. Severity is moderate. Nothing makes symptoms better or worse. Level V caveat for urgent need for intervention  Past Medical History  Diagnosis Date  . Dyslipidemia   . HTN (hypertension)   . CAD (coronary artery disease)   . Chest pain   . Supraventricular tachycardia   . Atrial fibrillation with RVR   . S/P CABG x 4   . AAA (abdominal aortic aneurysm)   . BPH (benign prostatic hyperplasia)   . MI, old     Past Surgical History  Procedure Date  . Cath with stenting   . Abdominal aortic aneurysm repair   . Hernia repair   . Transurethral resection of prostate     History reviewed. No pertinent family history.  History  Substance Use Topics  . Smoking status: Never Smoker   . Smokeless tobacco: Not on file   Comment: tobacco use - no  . Alcohol Use: No      Review of Systems  All other systems reviewed and are negative.    Allergies  Review of patient's allergies indicates no known allergies.  Home Medications   Current Outpatient Rx  Name Route Sig Dispense Refill  . ACYCLOVIR 800 MG PO TABS Oral Take 800 mg by mouth daily. Takes 1/2 tab daily    . ASPIRIN 81 MG PO TABS Oral Take 81 mg by mouth daily.    [name] DILTIAZEM HCL ER COATED BEADS 240 MG PO CP24 Oral Take 1 capsule (240 mg total) by mouth daily. 90 capsule 3  . ONE-DAILY MULTI VITAMINS PO TABS Oral Take 1 tablet by mouth daily.      [name] FISH OIL 1000 MG PO CAPS Oral Take 1 capsule by mouth daily.      [name] TAMSULOSIN HCL 0.4 MG PO CAPS Oral Take 0.4 mg by mouth  daily.      . ATORVASTATIN CALCIUM 40 MG PO TABS Oral Take 1 tablet (40 mg total) by mouth daily. 90 tablet 3  . CIALIS 5 MG PO TABS  TAKE ONE TABLET BY MOUTH 30 MINUTES PRIOR  TO  SEXUAL  ACTIVITY 30 each 5  . NITROGLYCERIN 0.4 MG SL SUBL Sublingual Place 1 tablet (0.4 mg total) under the tongue every 5 (five) minutes as needed for chest pain. 90 tablet 3  . RAMIPRIL 10 MG PO CAPS Oral Take 1 capsule (10 mg total) by mouth daily. 90 capsule 3    BP 139/77  Temp 98.7 F (37.1 C) (Oral)  Resp 28  Ht 5\' 8"  (1.727 m)  Wt 174 lb (78.926 kg)  BMI 26.46 kg/m2  SpO2 94%  Physical Exam  Nursing note and vitals reviewed. Constitutional: He is oriented to person, place, and time. He appears well-developed and well-nourished.       Looks well.  HENT:  Head: Normocephalic and atraumatic.  Eyes: Conjunctivae normal and EOM are normal. Pupils are equal, round, and reactive to light.  Neck: Normal range of motion. Neck supple.  Cardiovascular: Regular rhythm and normal heart sounds.        tachycardic  Pulmonary/Chest: Effort normal and breath sounds normal.  Abdominal: Soft. Bowel sounds are normal.  Musculoskeletal: Normal range of motion.  Neurological: He is alert and oriented to person, place, and time.  Skin: Skin is warm and dry.  Psychiatric: He has a normal mood and affect.    ED Course  Procedures (including critical care time)  Labs Reviewed  CBC WITH DIFFERENTIAL - Abnormal; Notable for the following:    WBC 10.7 (*)     Hemoglobin 12.9 (*)     HCT 38.7 (*)     Eosinophils Relative 6 (*)     All other components within normal limits  BASIC METABOLIC PANEL  TROPONIN I  D-DIMER, QUANTITATIVE   No results found.   No diagnosis found.  Date: 02/03/2012  Rate: 138  Rhythm: tachycardia  QRS Axis: normal  Intervals: normal  ST/T Wave abnormalities: normal  Conduction Disutrbances:right bundle branch block  Narrative Interpretation:   Old EKG Reviewed: none  available    MDM  Patient spontaneously converted to normal sinus rhythm without any intervention. I discussed the clinical scenario with the cardiologist on call. They will followup in office this week. Patient was without chest pain or dyspnea at discharge       Donnetta Hutching, MD 02/08/12 [phone]

## 2012-02-03 NOTE — ED Notes (Signed)
Has a history of Tachycardia but have not had it since July 30 th when I had bypass surgery per pt. Started having Tachycardia around 1800 tonight per pt. Denies chest pain.

## 2012-02-03 NOTE — ED Provider Notes (Signed)
History  This chart was scribed for Eric Hutching, MD by Erskine Emery. This patient was seen in room APA07/APA07 and the patient's care was started at 20:50.   CSN: [id number]  Arrival date & time 02/03/12  2036   First MD Initiated Contact with Patient 02/03/12 2050      Chief Complaint  Patient presents with  . Tachycardia    (Consider location/radiation/quality/duration/timing/severity/associated sxs/prior treatment) The history is provided by the patient. No language interpreter was used.  ZAKEE DEERMAN is a 71 y.o. male who presents to the Emergency Department complaining of tachycardia since 6 pm tonight. Pt reports he was feeling warm this morning but denies any associated chest pain, SOB, or diaphoresis. Pt has had many previous episodes of similar symptoms, the first of which was 9 years ago. Pt had a coronary artery bypass graft in July (7 weeks ago). He has a h/o HTN, high cholesterol, benign prostatic hyperplasia, heart attack (12 years ago), multiple stent placements, and abdominal aortic aneurysm repair. Pt takes Cardizem orally.   Dr. Juanetta Gosling is the pt's PCP and Dr. Hildred Alamin is the pt's cardiologist.  Past Medical History  Diagnosis Date  . Dyslipidemia   . HTN (hypertension)   . CAD (coronary artery disease)   . Chest pain   . Supraventricular tachycardia   . Atrial fibrillation with RVR   . S/P CABG x 4   . AAA (abdominal aortic aneurysm)     Past Surgical History  Procedure Date  . Cath with stenting   . Abdominal aortic aneurysm repair   . Hernia repair   . Transurethral resection of prostate     History reviewed. No pertinent family history.  History  Substance Use Topics  . Smoking status: Never Smoker   . Smokeless tobacco: Not on file   Comment: tobacco use - no  . Alcohol Use: No      Review of Systems A complete 10 system review of systems was obtained and all systems are negative except as noted in the HPI and PMH.    Allergies  Review  of patient's allergies indicates no known allergies.  Home Medications   Current Outpatient Rx  Name Route Sig Dispense Refill  . ACYCLOVIR 800 MG PO TABS Oral Take 800 mg by mouth daily. Takes 1/2 tab daily    . ASPIRIN 81 MG PO TABS Oral Take 81 mg by mouth daily.    [name] DILTIAZEM HCL ER COATED BEADS 240 MG PO CP24 Oral Take 1 capsule (240 mg total) by mouth daily. 90 capsule 3  . ONE-DAILY MULTI VITAMINS PO TABS Oral Take 1 tablet by mouth daily.      [name] FISH OIL 1000 MG PO CAPS Oral Take 1 capsule by mouth daily.      [name] TAMSULOSIN HCL 0.4 MG PO CAPS Oral Take 0.4 mg by mouth daily.      . ATORVASTATIN CALCIUM 40 MG PO TABS Oral Take 1 tablet (40 mg total) by mouth daily. 90 tablet 3  . CIALIS 5 MG PO TABS  TAKE ONE TABLET BY MOUTH 30 MINUTES PRIOR  TO  SEXUAL  ACTIVITY 30 each 5  . NITROGLYCERIN 0.4 MG SL SUBL Sublingual Place 1 tablet (0.4 mg total) under the tongue every 5 (five) minutes as needed for chest pain. 90 tablet 3  . RAMIPRIL 10 MG PO CAPS Oral Take 1 capsule (10 mg total) by mouth daily. 90 capsule 3    Triage Vitals: BP 139/77  Temp  98.7 F (37.1 C) (Oral)  Resp 28  Ht 5\' 8"  (1.727 m)  Wt 174 lb (78.926 kg)  BMI 26.46 kg/m2  SpO2 94%  Physical Exam  Nursing note and vitals reviewed. Constitutional: He is oriented to person, place, and time. He appears well-developed and well-nourished.  HENT:  Head: Normocephalic and atraumatic.  Eyes: Conjunctivae normal and EOM are normal. Pupils are equal, round, and reactive to light.  Neck: Normal range of motion. Neck supple.  Cardiovascular: Regular rhythm and normal heart sounds.  Tachycardia present.   Pulmonary/Chest: Effort normal and breath sounds normal.  Abdominal: Soft. Bowel sounds are normal.  Musculoskeletal: Normal range of motion.  Neurological: He is alert and oriented to person, place, and time.  Skin: Skin is warm and dry.  Psychiatric: He has a normal mood and affect.    ED Course  Procedures  (including critical care time) DIAGNOSTIC STUDIES: Oxygen Saturation is 94% on room air, adequate by my interpretation.    COORDINATION OF CARE: 20:59--I evaluated the patient and we discussed a treatment plan including blood work, EKG, cardiology consult, and possible admission to which the pt agreed.    Labs Reviewed - No data to display No results found.   No diagnosis found.   Date: 02/03/2012.....   No 1  Rate: 138  Rhythm: supraventricular tachycardia (SVT)  QRS Axis: normal  Intervals: normal  ST/T Wave abnormalities: normal  Conduction Disutrbances:right bundle branch block  Narrative Interpretation:   Old EKG Reviewed: changes noted   Date: 02/03/2012.....no2  Rate: 75  Rhythm: normal sinus rhythm  QRS Axis: normal  Intervals: normal  ST/T Wave abnormalities: normal  Conduction Disutrbances:first-degree A-V block   Narrative Interpretation:   Old EKG Reviewed: unchanged  MDM   Supraventricular tachycardia converted spontaneously to normal sinus rhythm with a rest only.  No chest pain or shortness of breath. Clinical scenario not consistent with pulmonary embolus. Discussed with cardiologist with [facility] in [place]. They agreed with management. Will followup in office this week.     I personally performed the services described in this documentation, which was scribed in my presence. The recorded information has been reviewed and considered.    Eric Hutching, MD 02/03/12 [phone]

## 2012-02-03 NOTE — ED Notes (Signed)
MD at bedside. 

## 2012-02-07 ENCOUNTER — Encounter (HOSPITAL_COMMUNITY)
Admission: RE | Admit: 2012-02-07 | Discharge: 2012-02-07 | Disposition: A | Discharge status: Home / Self Care | Payer: Medicare Other | Source: Ambulatory Visit | Attending: Cardiology | Admitting: Cardiology

## 2012-02-07 ENCOUNTER — Encounter (HOSPITAL_COMMUNITY): Payer: Self-pay

## 2012-02-07 VITALS — BP 130/78 | HR 64 | Ht 68.0 in | Wt 179.2 lb

## 2012-02-07 DIAGNOSIS — Z5189 Encounter for other specified aftercare: Secondary | ICD-10-CM | POA: Insufficient documentation

## 2012-02-07 DIAGNOSIS — Z954 Presence of other heart-valve replacement: Secondary | ICD-10-CM

## 2012-02-07 DIAGNOSIS — I2510 Atherosclerotic heart disease of native coronary artery without angina pectoris: Secondary | ICD-10-CM | POA: Insufficient documentation

## 2012-02-07 NOTE — Progress Notes (Signed)
Patient was referred to [place] by Dr. Pat Patrick from [facility] in [place] post coronary artery bypass grafting x 4 with aortic anerurysm repair on 12/13/11. During orientation advised patient on arrival and appointment times what to wear, what to do before, during and after exercise. Reviewed attendance and class policy. Talked about inclement weather and class consultation policy. Pt is scheduled to start Cardiac Rehab on 02/11/12 at 6:45. Pt was advised to come to class 5 minutes before class starts. He was also given instructions on meeting with the dietician and attending the Family Structure classes. Pt is eager to get started.

## 2012-02-07 NOTE — Patient Instructions (Signed)
Pt has finished orientation and is scheduled to start CR on 02/11/12 at 6:45 am. Pt has been instructed to arrive to class 15 minutes early for scheduled class. Pt has been instructed to wear comfortable clothing and shoes with rubber soles. Pt has been told to take their medications 1 hour prior to coming to class.  If the patient is not going to attend class, he/she has been instructed to call.   

## 2012-02-11 ENCOUNTER — Encounter (HOSPITAL_COMMUNITY)
Admission: RE | Admit: 2012-02-11 | Discharge: 2012-02-11 | Disposition: A | Discharge status: Home / Self Care | Payer: Medicare Other | Source: Ambulatory Visit | Attending: Cardiology | Admitting: Cardiology

## 2012-02-13 ENCOUNTER — Encounter (HOSPITAL_COMMUNITY)
Admission: RE | Admit: 2012-02-13 | Discharge: 2012-02-13 | Disposition: A | Discharge status: Home / Self Care | Payer: Medicare Other | Source: Ambulatory Visit | Attending: Cardiology | Admitting: Cardiology

## 2012-02-13 DIAGNOSIS — I2510 Atherosclerotic heart disease of native coronary artery without angina pectoris: Secondary | ICD-10-CM | POA: Insufficient documentation

## 2012-02-13 DIAGNOSIS — Z5189 Encounter for other specified aftercare: Secondary | ICD-10-CM | POA: Insufficient documentation

## 2012-02-15 ENCOUNTER — Encounter (HOSPITAL_COMMUNITY): Payer: Medicare Other

## 2012-02-18 ENCOUNTER — Encounter (HOSPITAL_COMMUNITY)
Admission: RE | Admit: 2012-02-18 | Discharge: 2012-02-18 | Disposition: A | Discharge status: Home / Self Care | Payer: Medicare Other | Source: Ambulatory Visit | Attending: Cardiology | Admitting: Cardiology

## 2012-02-20 ENCOUNTER — Encounter (HOSPITAL_COMMUNITY)
Admission: RE | Admit: 2012-02-20 | Discharge: 2012-02-20 | Disposition: A | Discharge status: Home / Self Care | Payer: Medicare Other | Source: Ambulatory Visit | Attending: Cardiology | Admitting: Cardiology

## 2012-02-22 ENCOUNTER — Encounter (HOSPITAL_COMMUNITY)
Admission: RE | Admit: 2012-02-22 | Discharge: 2012-02-22 | Disposition: A | Discharge status: Home / Self Care | Payer: Medicare Other | Source: Ambulatory Visit | Attending: Cardiology | Admitting: Cardiology

## 2012-02-25 ENCOUNTER — Encounter (HOSPITAL_COMMUNITY)
Admission: RE | Admit: 2012-02-25 | Discharge: 2012-02-25 | Disposition: A | Discharge status: Home / Self Care | Payer: Medicare Other | Source: Ambulatory Visit | Attending: Cardiology | Admitting: Cardiology

## 2012-02-27 ENCOUNTER — Encounter (HOSPITAL_COMMUNITY)
Admission: RE | Admit: 2012-02-27 | Discharge: 2012-02-27 | Disposition: A | Discharge status: Home / Self Care | Payer: Medicare Other | Source: Ambulatory Visit | Attending: Cardiology | Admitting: Cardiology

## 2012-02-28 ENCOUNTER — Ambulatory Visit (INDEPENDENT_AMBULATORY_CARE_PROVIDER_SITE_OTHER): Payer: Medicare Other | Admitting: Cardiology

## 2012-02-28 ENCOUNTER — Encounter: Payer: Self-pay | Admitting: Cardiology

## 2012-02-28 VITALS — BP 130/70 | HR 56 | Ht 68.0 in | Wt 175.1 lb

## 2012-02-28 DIAGNOSIS — I712 Thoracic aortic aneurysm, without rupture, unspecified: Secondary | ICD-10-CM

## 2012-02-28 DIAGNOSIS — I2584 Coronary atherosclerosis due to calcified coronary lesion: Secondary | ICD-10-CM

## 2012-02-28 MED ORDER — DILTIAZEM HCL ER BEADS 360 MG PO CP24: 360.0000 mg | ORAL_CAPSULE | Freq: Every day | ORAL | Status: DC

## 2012-02-28 NOTE — Progress Notes (Signed)
HPI Eric Beltran returns today for evaluation and management and followup of his history of coronary bypass grafting and ascending aortic aneurysm repair. Please see last office note for details. This was done at [facility] about 3 months ago.  His course has gone well and he is now in cardiac rehabilitation. He had postoperative atrial fib treated with diltiazem. He's currently on 90 mg 4 times a day for 3 more weeks.  He has no complaints except for some numbness over his left chest from mammary take down. He is compliant with his medications. He is enjoying cardiac rehabilitation.  Past Medical History  Diagnosis Date  . Dyslipidemia   . HTN (hypertension)   . CAD (coronary artery disease)   . Chest pain   . Supraventricular tachycardia   . Atrial fibrillation with RVR   . S/P CABG x 4   . AAA (abdominal aortic aneurysm)   . BPH (benign prostatic hyperplasia)   . MI, old     Current Outpatient Prescriptions  Medication Sig Dispense Refill  . acyclovir (ZOVIRAX) 800 MG tablet Take 400 mg by mouth daily. Takes 1/2 tab daily      . aspirin EC 81 MG tablet Take 81 mg by mouth daily.      [name] atorvastatin (LIPITOR) 40 MG tablet Take 1 tablet (40 mg total) by mouth daily.  90 tablet  3  . CIALIS 5 MG tablet TAKE ONE TABLET BY MOUTH 30 MINUTES PRIOR  TO  SEXUAL  ACTIVITY  30 each  5  . Multiple Vitamin (MULTIVITAMIN) tablet Take 1 tablet by mouth daily.        . nitroGLYCERIN (NITROSTAT) 0.4 MG SL tablet Place 1 tablet (0.4 mg total) under the tongue every 5 (five) minutes as needed for chest pain.  90 tablet  3  . Omega-3 Fatty Acids (FISH OIL) 1000 MG CAPS Take 1 capsule by mouth daily.        . ramipril (ALTACE) 10 MG capsule Take 1 capsule (10 mg total) by mouth daily.  90 capsule  3  . Tamsulosin HCl (FLOMAX) 0.4 MG CAPS Take 0.4 mg by mouth daily.          No Known Allergies  No family history on file.  History   Social History  . Marital Status: Widowed   Spouse Name: N/A    Number of Children: N/A  . Years of Education: N/A   Occupational History  . Not on file.   Social History Main Topics  . Smoking status: Never Smoker   . Smokeless tobacco: Not on file   Comment: tobacco use - no  . Alcohol Use: No  . Drug Use: No  . Sexually Active: Not on file   Other Topics Concern  . Not on file   Social History Narrative   Retired, widower, does not get regular exercise.     ROS ALL NEGATIVE EXCEPT THOSE NOTED IN HPI  PE  General Appearance: well developed, well nourished in no acute distress HEENT: symmetrical face, PERRLA, good dentition  Neck: no JVD, thyromegaly, or adenopathy, trachea midline Chest: symmetric without deformity, stable median sternotomy Cardiac: PMI non-displaced, RRR, normal S1, S2, no gallop or murmur Lung: clear to ausculation and percussion Vascular: all pulses full without bruits  Abdominal: nondistended, nontender, good bowel sounds, no HSM, no bruits Extremities: no cyanosis, clubbing or edema, no sign of DVT, no varicosities  Skin: normal color, no rashes Neuro: alert and oriented x 3,  non-focal Pysch: normal affect  EKG  BMET    Component Value Date/Time   NA 139 02/03/2012 2114   K 3.6 02/03/2012 2114   CL 104 02/03/2012 2114   CO2 23 02/03/2012 2114   GLUCOSE 161* 02/03/2012 2114   BUN 20 02/03/2012 2114   CREATININE 1.23 02/03/2012 2114   CREATININE 0.96 11/12/2011 1117   CALCIUM 9.0 02/03/2012 2114   GFRNONAA 58* 02/03/2012 2114   GFRAA 67* 02/03/2012 2114    Lipid Panel     Component Value Date/Time   CHOL 117 12/22/2010 0844   TRIG 85 12/22/2010 0844   HDL 37* 12/22/2010 0844   CHOLHDL 3.2 12/22/2010 0844   VLDL 17 12/22/2010 0844   LDLCALC 63 12/22/2010 0844    CBC    Component Value Date/Time   WBC 10.7* 02/03/2012 2114   RBC 4.62 02/03/2012 2114   HGB 12.9* 02/03/2012 2114   HCT 38.7* 02/03/2012 2114   PLT 376 02/03/2012 2114   MCV 83.8 02/03/2012 2114   MCH 27.9 02/03/2012 2114    MCHC 33.3 02/03/2012 2114   RDW 14.4 02/03/2012 2114   LYMPHSABS 2.2 02/03/2012 2114   MONOABS 0.9 02/03/2012 2114   EOSABS 0.7 02/03/2012 2114   BASOSABS 0.1 02/03/2012 2114

## 2012-02-28 NOTE — Patient Instructions (Addendum)
Your physician recommends that you schedule a follow-up appointment in: 1 year  Your physician has recommended you make the following change in your medication:  1- Diltiazem - Complete 2 more weeks of 90 mg 4 times a day regimen then start 360 mg daily, each day thereafter.

## 2012-02-28 NOTE — Assessment & Plan Note (Signed)
Stable. Followup in a year.

## 2012-02-28 NOTE — Assessment & Plan Note (Signed)
Stable. Continue secondary preventative therapy. He will remain in cardiac rehabilitation for another month.

## 2012-02-29 ENCOUNTER — Encounter (HOSPITAL_COMMUNITY)
Admission: RE | Admit: 2012-02-29 | Discharge: 2012-02-29 | Disposition: A | Discharge status: Home / Self Care | Payer: Medicare Other | Source: Ambulatory Visit | Attending: Cardiology | Admitting: Cardiology

## 2012-03-03 ENCOUNTER — Encounter (HOSPITAL_COMMUNITY)
Admission: RE | Admit: 2012-03-03 | Discharge: 2012-03-03 | Disposition: A | Discharge status: Home / Self Care | Payer: Medicare Other | Source: Ambulatory Visit | Attending: Cardiology | Admitting: Cardiology

## 2012-03-05 ENCOUNTER — Encounter (HOSPITAL_COMMUNITY)
Admission: RE | Admit: 2012-03-05 | Discharge: 2012-03-05 | Disposition: A | Discharge status: Home / Self Care | Payer: Medicare Other | Source: Ambulatory Visit | Attending: Cardiology | Admitting: Cardiology

## 2012-03-07 ENCOUNTER — Encounter (HOSPITAL_COMMUNITY)
Admission: RE | Admit: 2012-03-07 | Discharge: 2012-03-07 | Disposition: A | Discharge status: Home / Self Care | Payer: Medicare Other | Source: Ambulatory Visit | Attending: Cardiology | Admitting: Cardiology

## 2012-03-10 ENCOUNTER — Encounter (HOSPITAL_COMMUNITY)
Admission: RE | Admit: 2012-03-10 | Discharge: 2012-03-10 | Disposition: A | Discharge status: Home / Self Care | Payer: Medicare Other | Source: Ambulatory Visit | Attending: Cardiology | Admitting: Cardiology

## 2012-03-12 ENCOUNTER — Encounter (HOSPITAL_COMMUNITY)
Admission: RE | Admit: 2012-03-12 | Discharge: 2012-03-12 | Disposition: A | Discharge status: Home / Self Care | Payer: Medicare Other | Source: Ambulatory Visit | Attending: Cardiology | Admitting: Cardiology

## 2012-03-14 ENCOUNTER — Encounter (HOSPITAL_COMMUNITY)
Admission: RE | Admit: 2012-03-14 | Discharge: 2012-03-14 | Disposition: A | Discharge status: Home / Self Care | Payer: Medicare Other | Source: Ambulatory Visit | Attending: Cardiology | Admitting: Cardiology

## 2012-03-14 DIAGNOSIS — Z5189 Encounter for other specified aftercare: Secondary | ICD-10-CM | POA: Insufficient documentation

## 2012-03-14 DIAGNOSIS — I2510 Atherosclerotic heart disease of native coronary artery without angina pectoris: Secondary | ICD-10-CM | POA: Insufficient documentation

## 2012-03-17 ENCOUNTER — Encounter (HOSPITAL_COMMUNITY): Payer: Medicare Other

## 2012-03-19 ENCOUNTER — Encounter (HOSPITAL_COMMUNITY)
Admission: RE | Admit: 2012-03-19 | Discharge: 2012-03-19 | Disposition: A | Discharge status: Home / Self Care | Payer: Medicare Other | Source: Ambulatory Visit | Attending: Cardiology | Admitting: Cardiology

## 2012-03-21 ENCOUNTER — Encounter (HOSPITAL_COMMUNITY)
Admission: RE | Admit: 2012-03-21 | Discharge: 2012-03-21 | Disposition: A | Discharge status: Home / Self Care | Payer: Medicare Other | Source: Ambulatory Visit | Attending: Cardiology | Admitting: Cardiology

## 2012-03-24 ENCOUNTER — Encounter (HOSPITAL_COMMUNITY)
Admission: RE | Admit: 2012-03-24 | Discharge: 2012-03-24 | Disposition: A | Discharge status: Home / Self Care | Payer: Medicare Other | Source: Ambulatory Visit | Attending: Cardiology | Admitting: Cardiology

## 2012-03-26 ENCOUNTER — Encounter (HOSPITAL_COMMUNITY)
Admission: RE | Admit: 2012-03-26 | Discharge: 2012-03-26 | Disposition: A | Discharge status: Home / Self Care | Payer: Medicare Other | Source: Ambulatory Visit | Attending: Cardiology | Admitting: Cardiology

## 2012-03-28 ENCOUNTER — Encounter (HOSPITAL_COMMUNITY)
Admission: RE | Admit: 2012-03-28 | Discharge: 2012-03-28 | Disposition: A | Discharge status: Home / Self Care | Payer: Medicare Other | Source: Ambulatory Visit | Attending: Cardiology | Admitting: Cardiology

## 2012-03-31 ENCOUNTER — Encounter (HOSPITAL_COMMUNITY)
Admission: RE | Admit: 2012-03-31 | Discharge: 2012-03-31 | Disposition: A | Discharge status: Home / Self Care | Payer: Medicare Other | Source: Ambulatory Visit | Attending: Cardiology | Admitting: Cardiology

## 2012-04-02 ENCOUNTER — Encounter (HOSPITAL_COMMUNITY)
Admission: RE | Admit: 2012-04-02 | Discharge: 2012-04-02 | Disposition: A | Discharge status: Home / Self Care | Payer: Medicare Other | Source: Ambulatory Visit | Attending: Cardiology | Admitting: Cardiology

## 2012-04-04 ENCOUNTER — Encounter (HOSPITAL_COMMUNITY): Payer: Medicare Other

## 2012-04-07 ENCOUNTER — Encounter (HOSPITAL_COMMUNITY)
Admission: RE | Admit: 2012-04-07 | Discharge: 2012-04-07 | Disposition: A | Discharge status: Home / Self Care | Payer: Medicare Other | Source: Ambulatory Visit | Attending: Cardiology | Admitting: Cardiology

## 2012-04-09 ENCOUNTER — Encounter (HOSPITAL_COMMUNITY)
Admission: RE | Admit: 2012-04-09 | Discharge: 2012-04-09 | Disposition: A | Discharge status: Home / Self Care | Payer: Medicare Other | Source: Ambulatory Visit | Attending: Cardiology | Admitting: Cardiology

## 2012-04-11 ENCOUNTER — Encounter (HOSPITAL_COMMUNITY): Payer: Medicare Other

## 2012-04-14 ENCOUNTER — Encounter (HOSPITAL_COMMUNITY): Payer: Medicare Other

## 2012-04-16 ENCOUNTER — Encounter (HOSPITAL_COMMUNITY)
Admission: RE | Admit: 2012-04-16 | Discharge: 2012-04-16 | Disposition: A | Discharge status: Home / Self Care | Payer: Medicare Other | Source: Ambulatory Visit | Attending: Cardiology | Admitting: Cardiology

## 2012-04-16 DIAGNOSIS — Z5189 Encounter for other specified aftercare: Secondary | ICD-10-CM | POA: Insufficient documentation

## 2012-04-16 DIAGNOSIS — I2510 Atherosclerotic heart disease of native coronary artery without angina pectoris: Secondary | ICD-10-CM | POA: Insufficient documentation

## 2012-04-18 ENCOUNTER — Encounter (HOSPITAL_COMMUNITY)
Admission: RE | Admit: 2012-04-18 | Discharge: 2012-04-18 | Disposition: A | Discharge status: Home / Self Care | Payer: Medicare Other | Source: Ambulatory Visit | Attending: Cardiology | Admitting: Cardiology

## 2012-04-21 ENCOUNTER — Encounter (HOSPITAL_COMMUNITY): Payer: Medicare Other

## 2012-04-23 ENCOUNTER — Encounter (HOSPITAL_COMMUNITY)
Admission: RE | Admit: 2012-04-23 | Discharge: 2012-04-23 | Disposition: A | Discharge status: Home / Self Care | Payer: Medicare Other | Source: Ambulatory Visit | Attending: Cardiology | Admitting: Cardiology

## 2012-04-25 ENCOUNTER — Encounter (HOSPITAL_COMMUNITY)
Admission: RE | Admit: 2012-04-25 | Discharge: 2012-04-25 | Disposition: A | Discharge status: Home / Self Care | Payer: Medicare Other | Source: Ambulatory Visit | Attending: Cardiology | Admitting: Cardiology

## 2012-04-28 ENCOUNTER — Encounter (HOSPITAL_COMMUNITY)
Admission: RE | Admit: 2012-04-28 | Discharge: 2012-04-28 | Disposition: A | Discharge status: Home / Self Care | Payer: Medicare Other | Source: Ambulatory Visit | Attending: Cardiology | Admitting: Cardiology

## 2012-04-30 ENCOUNTER — Encounter (HOSPITAL_COMMUNITY)
Admission: RE | Admit: 2012-04-30 | Discharge: 2012-04-30 | Disposition: A | Discharge status: Home / Self Care | Payer: Medicare Other | Source: Ambulatory Visit | Attending: Cardiology | Admitting: Cardiology

## 2012-05-02 ENCOUNTER — Encounter (HOSPITAL_COMMUNITY): Payer: Medicare Other

## 2012-05-05 ENCOUNTER — Encounter (HOSPITAL_COMMUNITY): Payer: Medicare Other

## 2012-05-05 ENCOUNTER — Ambulatory Visit (HOSPITAL_COMMUNITY)
Admission: RE | Admit: 2012-05-05 | Discharge: 2012-05-05 | Disposition: A | Discharge status: Home / Self Care | Payer: Medicare Other | Source: Ambulatory Visit | Attending: Pulmonary Disease | Admitting: Pulmonary Disease

## 2012-05-05 ENCOUNTER — Other Ambulatory Visit (HOSPITAL_COMMUNITY): Payer: Self-pay | Admitting: Pulmonary Disease

## 2012-05-05 DIAGNOSIS — R079 Chest pain, unspecified: Secondary | ICD-10-CM | POA: Insufficient documentation

## 2012-05-05 DIAGNOSIS — I517 Cardiomegaly: Secondary | ICD-10-CM | POA: Insufficient documentation

## 2012-05-09 ENCOUNTER — Encounter (HOSPITAL_COMMUNITY): Payer: Medicare Other

## 2012-05-12 ENCOUNTER — Encounter (HOSPITAL_COMMUNITY)
Admission: RE | Admit: 2012-05-12 | Discharge: 2012-05-12 | Disposition: A | Discharge status: Home / Self Care | Payer: Medicare Other | Source: Ambulatory Visit | Attending: Cardiology | Admitting: Cardiology

## 2012-05-14 ENCOUNTER — Encounter (HOSPITAL_COMMUNITY): Payer: Medicare Other

## 2012-05-16 ENCOUNTER — Encounter (HOSPITAL_COMMUNITY): Payer: Medicare Other

## 2012-05-19 ENCOUNTER — Encounter (HOSPITAL_COMMUNITY): Payer: Medicare Other

## 2012-05-21 ENCOUNTER — Encounter (HOSPITAL_COMMUNITY): Payer: Medicare Other

## 2012-05-23 ENCOUNTER — Encounter (HOSPITAL_COMMUNITY)
Admission: RE | Admit: 2012-05-23 | Discharge: 2012-05-23 | Disposition: A | Discharge status: Home / Self Care | Payer: Medicare Other | Source: Ambulatory Visit | Attending: Cardiology | Admitting: Cardiology

## 2012-05-23 DIAGNOSIS — Z5189 Encounter for other specified aftercare: Secondary | ICD-10-CM | POA: Insufficient documentation

## 2012-05-23 DIAGNOSIS — I2510 Atherosclerotic heart disease of native coronary artery without angina pectoris: Secondary | ICD-10-CM | POA: Insufficient documentation

## 2012-05-26 ENCOUNTER — Encounter (HOSPITAL_COMMUNITY): Payer: Medicare Other

## 2012-05-28 ENCOUNTER — Encounter (HOSPITAL_COMMUNITY): Payer: Medicare Other

## 2012-05-29 ENCOUNTER — Other Ambulatory Visit: Payer: Self-pay | Admitting: *Deleted

## 2012-05-29 NOTE — Telephone Encounter (Signed)
Noted incoming fax to request MD Wall refill pt Viagra 100 #3 in quantity with question of refills, pt does not have Viagra in his current medication list and no prescription for Viagra ordered via epic, noted pt did have Cialis 5mg  #30 with 5 refills sent to [facility] 09-27-11 via MD Juanetta Gosling, please advise

## 2012-05-30 ENCOUNTER — Encounter (HOSPITAL_COMMUNITY): Payer: Medicare Other

## 2012-05-30 MED ORDER — SILDENAFIL CITRATE 100 MG PO TABS: 100.0000 mg | ORAL_TABLET | Freq: Every day | ORAL | Status: DC | PRN

## 2012-05-30 NOTE — Telephone Encounter (Signed)
Okay with Dr. Daleen Squibb for pt to switch to Viagra. Talked with pt and Cialis has gone up to $200.00 Viagra less expensive according to pt and "[employer] will give him free sample if he has prescripton." Prescription e-scribed.  Mylo Red RN

## 2012-06-02 ENCOUNTER — Encounter (HOSPITAL_COMMUNITY)
Admission: RE | Admit: 2012-06-02 | Discharge: 2012-06-02 | Disposition: A | Discharge status: Home / Self Care | Payer: Medicare Other | Source: Ambulatory Visit | Attending: Cardiology | Admitting: Cardiology

## 2012-06-04 ENCOUNTER — Encounter (HOSPITAL_COMMUNITY): Payer: Medicare Other

## 2012-06-06 ENCOUNTER — Encounter (HOSPITAL_COMMUNITY): Payer: Medicare Other

## 2012-06-09 ENCOUNTER — Encounter (HOSPITAL_COMMUNITY)
Admission: RE | Admit: 2012-06-09 | Discharge: 2012-06-09 | Disposition: A | Discharge status: Home / Self Care | Payer: Medicare Other | Source: Ambulatory Visit | Attending: Cardiology | Admitting: Cardiology

## 2012-06-11 ENCOUNTER — Encounter (HOSPITAL_COMMUNITY): Payer: Medicare Other

## 2012-06-11 ENCOUNTER — Emergency Department (HOSPITAL_COMMUNITY)
Admission: EM | Admit: 2012-06-11 | Discharge: 2012-06-11 | Disposition: A | Discharge status: Home / Self Care | Payer: Medicare Other | Attending: Emergency Medicine | Admitting: Emergency Medicine

## 2012-06-11 ENCOUNTER — Encounter (HOSPITAL_COMMUNITY): Payer: Self-pay | Admitting: Emergency Medicine

## 2012-06-11 DIAGNOSIS — Z951 Presence of aortocoronary bypass graft: Secondary | ICD-10-CM | POA: Insufficient documentation

## 2012-06-11 DIAGNOSIS — I2510 Atherosclerotic heart disease of native coronary artery without angina pectoris: Secondary | ICD-10-CM | POA: Insufficient documentation

## 2012-06-11 DIAGNOSIS — E785 Hyperlipidemia, unspecified: Secondary | ICD-10-CM | POA: Insufficient documentation

## 2012-06-11 DIAGNOSIS — R531 Weakness: Secondary | ICD-10-CM

## 2012-06-11 DIAGNOSIS — R5383 Other fatigue: Secondary | ICD-10-CM | POA: Insufficient documentation

## 2012-06-11 DIAGNOSIS — Z79899 Other long term (current) drug therapy: Secondary | ICD-10-CM | POA: Insufficient documentation

## 2012-06-11 DIAGNOSIS — Z9889 Other specified postprocedural states: Secondary | ICD-10-CM | POA: Insufficient documentation

## 2012-06-11 DIAGNOSIS — R5381 Other malaise: Secondary | ICD-10-CM | POA: Insufficient documentation

## 2012-06-11 DIAGNOSIS — Z9861 Coronary angioplasty status: Secondary | ICD-10-CM | POA: Insufficient documentation

## 2012-06-11 DIAGNOSIS — I252 Old myocardial infarction: Secondary | ICD-10-CM | POA: Insufficient documentation

## 2012-06-11 DIAGNOSIS — I10 Essential (primary) hypertension: Secondary | ICD-10-CM | POA: Insufficient documentation

## 2012-06-11 DIAGNOSIS — Z8679 Personal history of other diseases of the circulatory system: Secondary | ICD-10-CM | POA: Insufficient documentation

## 2012-06-11 DIAGNOSIS — I493 Ventricular premature depolarization: Secondary | ICD-10-CM

## 2012-06-11 DIAGNOSIS — Z7982 Long term (current) use of aspirin: Secondary | ICD-10-CM | POA: Insufficient documentation

## 2012-06-11 DIAGNOSIS — I4949 Other premature depolarization: Secondary | ICD-10-CM | POA: Insufficient documentation

## 2012-06-11 DIAGNOSIS — R42 Dizziness and giddiness: Secondary | ICD-10-CM | POA: Insufficient documentation

## 2012-06-11 DIAGNOSIS — Z8619 Personal history of other infectious and parasitic diseases: Secondary | ICD-10-CM | POA: Insufficient documentation

## 2012-06-11 LAB — COMPREHENSIVE METABOLIC PANEL
ALT: 24 U/L (ref 0–53)
AST: 24 U/L (ref 0–37)
Albumin: 3.4 g/dL — ABNORMAL LOW (ref 3.5–5.2)
Alkaline Phosphatase: 67 U/L (ref 39–117)
Calcium: 9.2 mg/dL (ref 8.4–10.5)
Potassium: 4 mEq/L (ref 3.5–5.1)
Sodium: 138 mEq/L (ref 135–145)
Total Protein: 7.2 g/dL (ref 6.0–8.3)

## 2012-06-11 LAB — CBC WITH DIFFERENTIAL/PLATELET
Basophils Absolute: 0.1 10*3/uL (ref 0.0–0.1)
Basophils Relative: 1 % (ref 0–1)
Eosinophils Absolute: 0.3 10*3/uL (ref 0.0–0.7)
Lymphs Abs: 2.3 10*3/uL (ref 0.7–4.0)
MCH: 28.4 pg (ref 26.0–34.0)
MCHC: 33.9 g/dL (ref 30.0–36.0)
Neutrophils Relative %: 60 % (ref 43–77)
Platelets: 295 10*3/uL (ref 150–400)
RBC: 4.9 MIL/uL (ref 4.22–5.81)
RDW: 15.5 % (ref 11.5–15.5)

## 2012-06-11 LAB — URINE MICROSCOPIC-ADD ON

## 2012-06-11 LAB — URINALYSIS, ROUTINE W REFLEX MICROSCOPIC
Glucose, UA: NEGATIVE mg/dL
Leukocytes, UA: NEGATIVE
Specific Gravity, Urine: 1.01 (ref 1.005–1.030)
Urobilinogen, UA: 0.2 mg/dL (ref 0.0–1.0)

## 2012-06-11 LAB — GLUCOSE, CAPILLARY: Glucose-Capillary: 99 mg/dL (ref 70–99)

## 2012-06-11 MED ORDER — SODIUM CHLORIDE 0.9 % IV SOLN
1000.0000 mL | INTRAVENOUS | Status: DC
  Administered 2012-06-11: 1000 mL via INTRAVENOUS

## 2012-06-11 MED ORDER — SODIUM CHLORIDE 0.9 % IV SOLN
1000.0000 mL | Freq: Once | INTRAVENOUS | Status: AC
  Administered 2012-06-11: 1000 mL via INTRAVENOUS

## 2012-06-11 NOTE — ED Provider Notes (Signed)
History     CSN: [id number]  Arrival date & time 06/11/12  1918   First MD Initiated Contact with Patient 06/11/12 1933      Chief Complaint  Patient presents with  . Dizziness  . Weakness    (Consider location/radiation/quality/duration/timing/severity/associated sxs/prior treatment) HPI  Patient reports this morning when he was getting dressed he started feeling lightheaded which was about 8:30 this morning states he feels weak. Patient states he had bypass surgery done in August and he goes to cardiac rehabilitation. His last session was 2 days ago. He states he felt fine. He states they taught him how to check his pulse and when he checked his pulse today he noted it was irregular and skipping. He states he's not aware of the palpitations and does not feel them. He denies chest pain, shortness of breath, headache. Nausea, vomiting. He states he feels like he has no energy and he feels off balance when he feels lightheaded. He states he's never felt this way before. He states all his life he feels weak when he needs to keep. He states today however when he ate the weakness did not go away.  PCP Dr Juanetta Gosling Cardiologist Dr Daleen Squibb  Past Medical History  Diagnosis Date  . Dyslipidemia   . HTN (hypertension)   . CAD (coronary artery disease)   . Chest pain   . Supraventricular tachycardia   . Atrial fibrillation with RVR   . S/P CABG x 4   . AAA (abdominal aortic aneurysm)   . BPH (benign prostatic hyperplasia)   . MI, old     Past Surgical History  Procedure Date  . Cath with stenting   . Abdominal aortic aneurysm repair   . Hernia repair   . Transurethral resection of prostate   . Coronary artery bypass graft     History reviewed. No pertinent family history.  History  Substance Use Topics  . Smoking status: Never Smoker   . Smokeless tobacco: Not on file     Comment: tobacco use - no  . Alcohol Use: No   Lives at home Lives alone   Review of Systems  All  other systems reviewed and are negative.    Allergies  Review of patient's allergies indicates no known allergies.  Home Medications   Current Outpatient Rx  Name  Route  Sig  Dispense  Refill  . ACYCLOVIR 800 MG PO TABS   Oral   Take 400 mg by mouth daily. Takes 1/2 tab daily         . ASPIRIN EC 81 MG PO TBEC   Oral   Take 81 mg by mouth at bedtime.          . ATORVASTATIN CALCIUM 40 MG PO TABS   Oral   Take 40 mg by mouth at bedtime.         [name] ONE-DAILY MULTI VITAMINS PO TABS   Oral   Take 1 tablet by mouth at bedtime.          [name] NITROGLYCERIN 0.4 MG SL SUBL   Sublingual   Place 1 tablet (0.4 mg total) under the tongue every 5 (five) minutes as needed for chest pain.   90 tablet   3   . FISH OIL 1000 MG PO CAPS   Oral   Take 1 capsule by mouth daily.           [name] PRESCRIPTION MEDICATION   Oral   Take 1 tablet by mouth  daily. BLOOD PRESSURE (strength and name is unknown to patient: PLEASE VERIFY)         . RAMIPRIL 10 MG PO CAPS   Oral   Take 10 mg by mouth at bedtime.         [name] SILDENAFIL CITRATE 100 MG PO TABS   Oral   Take 1 tablet (100 mg total) by mouth daily as needed for erectile dysfunction.   3 tablet   5   . TAMSULOSIN HCL 0.4 MG PO CAPS   Oral   Take 0.4 mg by mouth at bedtime.            BP 118/68  Pulse 66  Resp 18  SpO2 97%  Vital signs normal    Physical Exam  Nursing note and vitals reviewed. Constitutional: He is oriented to person, place, and time. He appears well-developed and well-nourished.  Non-toxic appearance. He does not appear ill. No distress.  HENT:  Head: Normocephalic and atraumatic.  Right Ear: External ear normal.  Left Ear: External ear normal.  Nose: Nose normal. No mucosal edema or rhinorrhea.  Mouth/Throat: Oropharynx is clear and moist and mucous membranes are normal. No dental abscesses or uvula swelling.  Eyes: Conjunctivae normal and EOM are normal. Pupils are equal, round, and reactive  to light.  Neck: Normal range of motion and full passive range of motion without pain. Neck supple.  Cardiovascular: Normal rate, regular rhythm and normal heart sounds.  Exam reveals no gallop and no friction rub.   No murmur heard.      No PVC's noted on the monitor during my exam  Pulmonary/Chest: Effort normal and breath sounds normal. No respiratory distress. He has no wheezes. He has no rhonchi. He has no rales. He exhibits no tenderness and no crepitus.  Abdominal: Soft. Normal appearance and bowel sounds are normal. He exhibits no distension. There is no tenderness. There is no rebound and no guarding.  Musculoskeletal: Normal range of motion. He exhibits no edema and no tenderness.       Moves all extremities well.   Neurological: He is alert and oriented to person, place, and time. He has normal strength. No cranial nerve deficit.  Skin: Skin is warm, dry and intact. No rash noted. No erythema. No pallor.  Psychiatric: He has a normal mood and affect. His speech is normal and behavior is normal. His mood appears not anxious.    ED Course  Procedures (including critical care time)  Patient states he was on diltiazem but it was stopped December 6. He does not have hypertension during the ED. He did not have orthostatic hypotension. His laboratory results all normal. The etiology of his weakness at this time is not clear.  Cardiac monitor strip printed out. Patient is noted to have rare PVCs  Orthostatic vital signs are normal  Results for orders placed during the hospital encounter of 06/11/12  GLUCOSE, CAPILLARY      Component Value Range   Glucose-Capillary 99  70 - 99 mg/dL  CBC WITH DIFFERENTIAL      Component Value Range   WBC 9.2  4.0 - 10.5 K/uL   RBC 4.90  4.22 - 5.81 MIL/uL   Hemoglobin 13.9  13.0 - 17.0 g/dL   HCT 21.3  08.6 - 57.8 %   MCV 83.7  78.0 - 100.0 fL   MCH 28.4  26.0 - 34.0 pg   MCHC 33.9  30.0 - 36.0 g/dL   RDW 46.9  62.9 -  15.5 %   Platelets 295   150 - 400 K/uL   Neutrophils Relative 60  43 - 77 %   Neutro Abs 5.5  1.7 - 7.7 K/uL   Lymphocytes Relative 25  12 - 46 %   Lymphs Abs 2.3  0.7 - 4.0 K/uL   Monocytes Relative 11  3 - 12 %   Monocytes Absolute 1.0  0.1 - 1.0 K/uL   Eosinophils Relative 3  0 - 5 %   Eosinophils Absolute 0.3  0.0 - 0.7 K/uL   Basophils Relative 1  0 - 1 %   Basophils Absolute 0.1  0.0 - 0.1 K/uL  COMPREHENSIVE METABOLIC PANEL      Component Value Range   Sodium 138  135 - 145 mEq/L   Potassium 4.0  3.5 - 5.1 mEq/L   Chloride 103  96 - 112 mEq/L   CO2 26  19 - 32 mEq/L   Glucose, Bld 101 (*) 70 - 99 mg/dL   BUN 15  6 - 23 mg/dL   Creatinine, Ser 1.61  0.50 - 1.35 mg/dL   Calcium 9.2  8.4 - 09.6 mg/dL   Total Protein 7.2  6.0 - 8.3 g/dL   Albumin 3.4 (*) 3.5 - 5.2 g/dL   AST 24  0 - 37 U/L   ALT 24  0 - 53 U/L   Alkaline Phosphatase 67  39 - 117 U/L   Total Bilirubin 0.2 (*) 0.3 - 1.2 mg/dL   GFR calc non Af Amer 82 (*) >90 mL/min   GFR calc Af Amer >90  >90 mL/min  TROPONIN I      Component Value Range   Troponin I <0.30  <0.30 ng/mL  MAGNESIUM      Component Value Range   Magnesium 2.2  1.5 - 2.5 mg/dL  URINALYSIS, ROUTINE W REFLEX MICROSCOPIC      Component Value Range   Color, Urine YELLOW  YELLOW   APPearance CLEAR  CLEAR   Specific Gravity, Urine 1.010  1.005 - 1.030   pH 5.5  5.0 - 8.0   Glucose, UA NEGATIVE  NEGATIVE mg/dL   Hgb urine dipstick TRACE (*) NEGATIVE   Bilirubin Urine NEGATIVE  NEGATIVE   Ketones, ur NEGATIVE  NEGATIVE mg/dL   Protein, ur NEGATIVE  NEGATIVE mg/dL   Urobilinogen, UA 0.2  0.0 - 1.0 mg/dL   Nitrite NEGATIVE  NEGATIVE   Leukocytes, UA NEGATIVE  NEGATIVE  URINE MICROSCOPIC-ADD ON      Component Value Range   WBC, UA 0-2  <3 WBC/hpf   RBC / HPF 0-2  <3 RBC/hpf    Laboratory interpretation all normal     Date: 06/11/2012  Rate: 70  Rhythm: normal sinus rhythm and premature ventricular contractions (PVC)  QRS Axis: normal  Intervals: normal  ST/T  Wave abnormalities: normal  Conduction Disutrbances:IRBBB  Narrative Interpretation:   Old EKG Reviewed: unchanged from 02/03/2012 except for PVC's     1. Weakness   2. PVC (premature ventricular contraction)     Plan discharge  Devoria Albe, MD, FACEP   MDM           Ward Givens, MD 06/11/12 2149

## 2012-06-11 NOTE — ED Notes (Signed)
Patient complaining of intermittent lightheadedness and generalized weakness that started this morning. Patient reports he felt as if he needed to eat something. Reports cardiac history, used to take Cardizem for irregular heartbeat.

## 2012-06-13 ENCOUNTER — Encounter (HOSPITAL_COMMUNITY): Payer: Medicare Other

## 2012-06-16 ENCOUNTER — Encounter (HOSPITAL_COMMUNITY): Payer: Medicare Other

## 2012-06-17 ENCOUNTER — Encounter: Payer: Medicare Other | Admitting: Cardiology

## 2012-06-18 ENCOUNTER — Encounter (HOSPITAL_COMMUNITY)
Admission: RE | Admit: 2012-06-18 | Discharge: 2012-06-18 | Disposition: A | Discharge status: Home / Self Care | Payer: Medicare Other | Source: Ambulatory Visit | Attending: Cardiology | Admitting: Cardiology

## 2012-06-18 DIAGNOSIS — I2510 Atherosclerotic heart disease of native coronary artery without angina pectoris: Secondary | ICD-10-CM | POA: Insufficient documentation

## 2012-06-18 DIAGNOSIS — Z5189 Encounter for other specified aftercare: Secondary | ICD-10-CM | POA: Insufficient documentation

## 2012-06-20 ENCOUNTER — Encounter (HOSPITAL_COMMUNITY): Payer: Medicare Other

## 2012-06-23 ENCOUNTER — Encounter (HOSPITAL_COMMUNITY)
Admission: RE | Admit: 2012-06-23 | Discharge: 2012-06-23 | Disposition: A | Discharge status: Home / Self Care | Payer: Medicare Other | Source: Ambulatory Visit | Attending: Cardiology | Admitting: Cardiology

## 2012-06-25 ENCOUNTER — Encounter (HOSPITAL_COMMUNITY): Payer: Medicare Other

## 2012-06-27 ENCOUNTER — Encounter (HOSPITAL_COMMUNITY): Payer: Medicare Other

## 2012-07-02 ENCOUNTER — Other Ambulatory Visit: Payer: Self-pay | Admitting: *Deleted

## 2012-07-02 ENCOUNTER — Encounter (HOSPITAL_COMMUNITY)
Admission: RE | Admit: 2012-07-02 | Discharge: 2012-07-02 | Disposition: A | Discharge status: Home / Self Care | Payer: Medicare Other | Source: Ambulatory Visit | Attending: Cardiology | Admitting: Cardiology

## 2012-07-02 MED ORDER — DILTIAZEM HCL ER COATED BEADS 360 MG PO CP24: 360.0000 mg | ORAL_CAPSULE | Freq: Every day | ORAL | Status: DC

## 2012-07-02 MED ORDER — ATORVASTATIN CALCIUM 40 MG PO TABS: 40.0000 mg | ORAL_TABLET | Freq: Every day | ORAL | Status: DC

## 2012-07-02 MED ORDER — RAMIPRIL 10 MG PO CAPS: 10.0000 mg | ORAL_CAPSULE | Freq: Every day | ORAL | Status: DC

## 2012-07-04 ENCOUNTER — Encounter (HOSPITAL_COMMUNITY): Payer: Medicare Other

## 2012-07-07 ENCOUNTER — Encounter (HOSPITAL_COMMUNITY): Payer: Medicare Other

## 2012-07-31 DIAGNOSIS — R339 Retention of urine, unspecified: Secondary | ICD-10-CM | POA: Insufficient documentation

## 2012-07-31 DIAGNOSIS — N2889 Other specified disorders of kidney and ureter: Secondary | ICD-10-CM | POA: Insufficient documentation

## 2012-10-15 ENCOUNTER — Other Ambulatory Visit: Payer: Self-pay

## 2012-10-15 DIAGNOSIS — I712 Thoracic aortic aneurysm, without rupture, unspecified: Secondary | ICD-10-CM

## 2012-10-22 ENCOUNTER — Ambulatory Visit: Payer: Medicare Other | Admitting: Surgery

## 2012-11-03 ENCOUNTER — Telehealth: Payer: Self-pay | Admitting: *Deleted

## 2012-11-03 NOTE — Telephone Encounter (Signed)
Eric Beltran called to cancel his scheduled MRA and appointment for 11/05/12 with Dr. Laneta Simmers.  He is followed for surveillance of his TAA.  He informed me that he had a CABG and repair of the TAA at [facility] on 12/13/11 by Dr. Remo Lipps. Dr. Valera Castle will continue to follow him. I will inform Dr. Laneta Simmers of the same.

## 2012-11-05 ENCOUNTER — Other Ambulatory Visit: Payer: Medicare Other

## 2012-11-05 ENCOUNTER — Ambulatory Visit: Payer: Medicare Other | Admitting: Surgery

## 2013-03-02 ENCOUNTER — Encounter: Payer: Self-pay | Admitting: Cardiology

## 2013-03-02 ENCOUNTER — Ambulatory Visit (INDEPENDENT_AMBULATORY_CARE_PROVIDER_SITE_OTHER): Payer: Medicare Other | Admitting: Cardiology

## 2013-03-02 VITALS — BP 125/70 | HR 69 | Ht 66.0 in | Wt 188.4 lb

## 2013-03-02 DIAGNOSIS — I712 Thoracic aortic aneurysm, without rupture, unspecified: Secondary | ICD-10-CM

## 2013-03-02 DIAGNOSIS — I2510 Atherosclerotic heart disease of native coronary artery without angina pectoris: Secondary | ICD-10-CM

## 2013-03-02 DIAGNOSIS — I10 Essential (primary) hypertension: Secondary | ICD-10-CM

## 2013-03-02 DIAGNOSIS — E782 Mixed hyperlipidemia: Secondary | ICD-10-CM

## 2013-03-02 NOTE — Assessment & Plan Note (Signed)
Followed by Dr. Juanetta Gosling. Will request most recent lipid panel for review.

## 2013-03-02 NOTE — Assessment & Plan Note (Signed)
Blood pressure control is good today. No changes made. 

## 2013-03-02 NOTE — Patient Instructions (Signed)

## 2013-03-02 NOTE — Progress Notes (Signed)
Clinical Summary Eric Beltran is a 72 y.o.male presenting for office visit. He is a former patient of Dr. Daleen Beltran, last seen in October 2013. He indicates that he has been doing fairly well. No significant angina symptoms or unusual shortness of breath. He reports no cardiac hospitalizations. Also no significant palpitations over the last 5 months, had previously documented perioperative atrial fibrillation. We reviewed his medications. He typically has lipids done with Dr. Juanetta Beltran. No recent ECG for review. No use of nitroglycerin.  I updated his history and reviewed the records from [facility].  ECG today shows sinus rhythm with left atrial enlargement, possible old inferior infarct pattern.   No Known Allergies  Current Outpatient Prescriptions  Medication Sig Dispense Refill  . acyclovir (ZOVIRAX) 800 MG tablet Take 400 mg by mouth daily. Takes 1/2 tab daily      . aspirin EC 81 MG tablet Take 81 mg by mouth at bedtime.       [name] atorvastatin (LIPITOR) 40 MG tablet Take 1 tablet (40 mg total) by mouth at bedtime.  90 tablet  1  . metoprolol succinate (TOPROL-XL) 50 MG 24 hr tablet Take 50 mg by mouth daily.      . Multiple Vitamin (MULTIVITAMIN) tablet Take 1 tablet by mouth at bedtime.       [name] olmesartan (BENICAR) 20 MG tablet Take 20 mg by mouth daily.      . Omega-3 Fatty Acids (FISH OIL) 1000 MG CAPS Take 1 capsule by mouth daily.        . tadalafil (CIALIS) 5 MG tablet Take 5 mg by mouth as needed for erectile dysfunction.      . Tamsulosin HCl (FLOMAX) 0.4 MG CAPS Take 0.4 mg by mouth 2 (two) times daily.       . nitroGLYCERIN (NITROSTAT) 0.4 MG SL tablet Place 1 tablet (0.4 mg total) under the tongue every 5 (five) minutes as needed for chest pain.  90 tablet  3   No current facility-administered medications for this visit.    Past Medical History  Diagnosis Date  . Mixed hyperlipidemia   . Essential hypertension, benign   . Coronary atherosclerosis of native coronary artery    Multivessel status post prior stenting and ultimately CABG 12/2011 at [facility]  . Supraventricular tachycardia   . Atrial fibrillation     Perioperative  . Ascending aortic aneurysm     Status post repair 12/2011 at [facility]  . BPH (benign prostatic hyperplasia)     Past Surgical History  Procedure Laterality Date  . Ascending aortic aneurysm repair  12/2011 [facility]    26 mm.Dacron graft  . Hernia repair    . Transurethral resection of prostate    . Coronary artery bypass graft  12/2011 [facility]    LIMA to LAD, SVG to PLB, SVG to ramus, SVG to OM    Social History Eric Beltran reports that he has never smoked. He does not have any smokeless tobacco history on file. Eric Beltran reports that he drinks alcohol.  Review of Systems Negative except as outlined above.  Physical Examination Filed Vitals:   03/02/13 1341  BP: 125/70  Pulse: 69   Filed Weights   03/02/13 1341  Weight: 188 lb 6.4 oz (85.458 kg)   Comfortable at rest. HEENT: Conjunctiva and lids normal, oropharynx clear with moist mucosa. Neck: Supple, no elevated JVP or carotid bruits, no thyromegaly. Lungs: Clear to auscultation, nonlabored breathing at rest. Cardiac: Regular rate and rhythm, no S3 or  significant systolic murmur, no pericardial rub. Abdomen: Soft, nontender, bowel sounds present. Extremities: No pitting edema, distal pulses 2+. Skin: Warm and dry. Musculoskeletal: No kyphosis. Neuropsychiatric: Alert and oriented x3, affect grossly appropriate.   Problem List and Plan   Coronary atherosclerosis of native coronary artery Multivessel disease status post CABG in August 2013 at [facility] as detailed above. He is doing well symptomatically on medical therapy. Continue observation.  Thoracic aneurysm without mention of rupture Status post concurrent Dacron graft repair at time of CABG at [facility].  Mixed hyperlipidemia Followed by Dr. Juanetta Beltran. Will request most recent lipid panel for review.  Essential  hypertension, benign Blood pressure control is good today. No changes made.    Jonelle Sidle, M.D., F.A.C.C.

## 2013-03-02 NOTE — Assessment & Plan Note (Signed)
Status post concurrent Dacron graft repair at time of CABG at [facility].

## 2013-03-02 NOTE — Assessment & Plan Note (Signed)
Multivessel disease status post CABG in August 2013 at [facility] as detailed above. He is doing well symptomatically on medical therapy. Continue observation.

## 2013-03-16 ENCOUNTER — Other Ambulatory Visit: Payer: Self-pay

## 2013-03-16 MED ORDER — ATORVASTATIN CALCIUM 40 MG PO TABS: 40.0000 mg | ORAL_TABLET | Freq: Every day | ORAL | Status: DC

## 2013-03-19 NOTE — Addendum Note (Signed)
Encounter addended by: Angelica Pou, RN on: 03/19/2013  8:56 AM<BR>     Documentation filed: Clinical Notes

## 2013-03-19 NOTE — Addendum Note (Signed)
Encounter addended by: Angelica Pou, RN on: 03/19/2013  8:54 AM<BR>     Documentation filed: Notes Section

## 2013-03-19 NOTE — Progress Notes (Signed)
Cardiac Rehabilitation Program Outcomes Report   Orientation:  02/07/2012 Graduate Date:  07/02/2012 Discharge Date:  07/02/2012 # of sessions completed: 36 DX: CABG X 4 and Aorta Repair  Cardiologist: Valera Castle Family MD:  Benedetto Goad Time:  06:45  A.  Exercise Program:  Tolerates exercise @ 4.34  METS for 15 minutes, Walk Test Results:  Pre: Pre Walk Test : Resting HR 72, BP 150/70, O2 96%, RPE 6 and RPD 6, ^min HR 101, BP 170/80, 02100 %, RPE 9 and RPD 9; POst HR 67, BP 132/68 O2 100% and RPE 6 and RPD6. walked 1750ft at 3.2 MPH at 3.45 METS. Post Walk test, Resting HR 61 and BP 118/62, O2 96, and RPE 6 and RPD 6, ^min HR is 87, BP 138/78 O2 98 RPE 8 and RPD 8, Post HR 60 and BP 130/70 O2 98%, RPE 6,and RPD 6, Walked 1800 ft at 3.2 mph at 4.5 METS. and Discharged to home exercise program.  Anticipated compliance:  excellent  B.  Mental Health:  Good mental attitude  C.  Education/Instruction/Skills  Accurately checks own pulse.  Rest:  61  Exercise 105, Knows THR for exercise, Uses Perceived Exertion Scale and/or Dyspnea Scale and Attended 11 education classes  Uses Perceived Exertion Scale and/or Dyspnea Scale  D.  Nutrition/Weight Control/Body Composition:  Adherence to prescribed nutrition program: good    E.  Blood Lipids    Lab Results  Component Value Date   CHOL 117 12/22/2010   HDL 37* 12/22/2010   LDLCALC 63 12/22/2010   TRIG 85 12/22/2010   CHOLHDL 3.2 12/22/2010    F.  Lifestyle Changes:  Making positive lifestyle changes  G.  Symptoms noted with exercise:  Asymptomatic  Report Completed By:  Lelon Huh. Josep Luviano RN   Comments:  This is patients graduation report . PAtient did very well while in rehab HE achieved a peak METS of 4.34. His resting Hr was 61 and His resting BP was 118/62. His Peak HR was 105 and His peak BP was 160/80.

## 2013-09-30 ENCOUNTER — Other Ambulatory Visit: Payer: Self-pay | Admitting: Cardiology

## 2013-11-20 ENCOUNTER — Telehealth: Payer: Self-pay | Admitting: Cardiology

## 2013-11-20 NOTE — Telephone Encounter (Signed)
Dr Jonny Ruiz called wanting to speak with Dr Domenic Polite or his nurse in reference to obtaining surgical clearance on patient. You can try his office first then his cell number  Office  [phone] Cell     [phone]

## 2013-11-20 NOTE — Telephone Encounter (Signed)
Left message to return call 

## 2013-11-20 NOTE — Telephone Encounter (Signed)
Patient last seen 02/2013 with a 1 year recall.   Will you need to see him in office first?

## 2013-11-20 NOTE — Telephone Encounter (Signed)
I spoke with Dr. Lorayne Bender of [facility] regarding Eric Beltran. He is scheduled to undergo TURP on July 20 under general anesthesia. Please call Mr. Molzahn and ask him how he has been doing on medical therapy since I saw him in October 2014. If he has any concerns about progressive cardiovascular symptoms, we will need to see him next week in the office to discuss. Otherwise if he has been doing well, I anticipate that he should be able to proceed with surgery, can hold aspirin for a week prior, and then resume thereafter. Please let me know either way. Fax number for [facility] is [phone].

## 2013-11-23 NOTE — Telephone Encounter (Signed)
Spoke with patient and he informed nurse that he was doing okay from a cardiac standpoint. Patient denied any sob, dizziness or chest pain.

## 2013-11-23 NOTE — Telephone Encounter (Signed)
MD aware. Patient cleared for TURP and all notes sent to Dr. Freddie Breech office.

## 2014-05-21 ENCOUNTER — Other Ambulatory Visit: Payer: Self-pay | Admitting: *Deleted

## 2014-05-21 ENCOUNTER — Ambulatory Visit (INDEPENDENT_AMBULATORY_CARE_PROVIDER_SITE_OTHER): Payer: Medicare Other | Admitting: Cardiology

## 2014-05-21 ENCOUNTER — Encounter: Payer: Self-pay | Admitting: Cardiology

## 2014-05-21 ENCOUNTER — Encounter: Payer: Medicare Other | Admitting: Cardiology

## 2014-05-21 ENCOUNTER — Encounter: Payer: Self-pay | Admitting: *Deleted

## 2014-05-21 VITALS — BP 110/62 | HR 63 | Wt 193.0 lb

## 2014-05-21 DIAGNOSIS — I10 Essential (primary) hypertension: Secondary | ICD-10-CM

## 2014-05-21 DIAGNOSIS — R011 Cardiac murmur, unspecified: Secondary | ICD-10-CM

## 2014-05-21 DIAGNOSIS — I25118 Atherosclerotic heart disease of native coronary artery with other forms of angina pectoris: Secondary | ICD-10-CM

## 2014-05-21 DIAGNOSIS — E782 Mixed hyperlipidemia: Secondary | ICD-10-CM

## 2014-05-21 MED ORDER — DILTIAZEM HCL ER COATED BEADS 240 MG PO CP24: 240.0000 mg | ORAL_CAPSULE | Freq: Every day | ORAL | Status: DC

## 2014-05-21 NOTE — Progress Notes (Addendum)
   Reason for visit: CAD, hyperlipidemia  Clinical Summary Mr. Hynes is a 74 y.o.male last seen in October 2014.  He continues to follow with Dr. Luan Pulling.  No Known Allergies  Current Outpatient Prescriptions  Medication Sig Dispense Refill  . acyclovir (ZOVIRAX) 800 MG tablet Take 400 mg by mouth daily. Takes 1/2 tab daily    . aspirin EC 81 MG tablet Take 81 mg by mouth at bedtime.     [name] atorvastatin (LIPITOR) 40 MG tablet TAKE 1 TABLET BY MOUTH AT BEDTIME 90 tablet 3  . metoprolol succinate (TOPROL-XL) 50 MG 24 hr tablet Take 50 mg by mouth daily.    . Multiple Vitamin (MULTIVITAMIN) tablet Take 1 tablet by mouth at bedtime.     . nitroGLYCERIN (NITROSTAT) 0.4 MG SL tablet Place 1 tablet (0.4 mg total) under the tongue every 5 (five) minutes as needed for chest pain. 90 tablet 3  . olmesartan (BENICAR) 20 MG tablet Take 20 mg by mouth daily.    . Omega-3 Fatty Acids (FISH OIL) 1000 MG CAPS Take 1 capsule by mouth daily.      . tadalafil (CIALIS) 5 MG tablet Take 5 mg by mouth as needed for erectile dysfunction.    . Tamsulosin HCl (FLOMAX) 0.4 MG CAPS Take 0.4 mg by mouth 2 (two) times daily.      No current facility-administered medications for this visit.    Past Medical History  Diagnosis Date  . Mixed hyperlipidemia   . Essential hypertension, benign   . Coronary atherosclerosis of native coronary artery     Multivessel status post prior stenting and ultimately CABG 12/2011 at [facility]  . Supraventricular tachycardia   . Atrial fibrillation     Perioperative  . Ascending aortic aneurysm     Status post repair 12/2011 at [facility]  . BPH (benign prostatic hyperplasia)     Past Surgical History  Procedure Laterality Date  . Ascending aortic aneurysm repair  12/2011 [facility]    26 mm.Dacron graft  . Hernia repair    . Transurethral resection of prostate    . Coronary artery bypass graft  12/2011 [facility]    LIMA to LAD, SVG to PLB, SVG to ramus, SVG to OM    Social  History Mr. Orner reports that he has never smoked. He does not have any smokeless tobacco history on file. Mr. Evetts reports that he drinks alcohol.  Review of Systems Complete review of systems negative except as otherwise outlined in the clinical summary and also the following.  Physical Examination There were no vitals filed for this visit. There were no vitals filed for this visit.  Comfortable at rest. HEENT: Conjunctiva and lids normal, oropharynx clear with moist mucosa. Neck: Supple, no elevated JVP or carotid bruits, no thyromegaly. Lungs: Clear to auscultation, nonlabored breathing at rest. Cardiac: Regular rate and rhythm, no S3 or significant systolic murmur, no pericardial rub. Abdomen: Soft, nontender, bowel sounds present. Extremities: No pitting edema, distal pulses 2+. Skin: Warm and dry. Musculoskeletal: No kyphosis. Neuropsychiatric: Alert and oriented x3, affect grossly appropriate.   Problem List and Plan   No problem-specific assessment & plan notes found for this encounter.   Satira Sark, M.D., F.A.C.C.

## 2014-05-21 NOTE — Assessment & Plan Note (Signed)
He is reporting some exertional angina symptoms as noted above. Medications were self-modified. I reviewed his old records and prior medical regimens. At this time our plan is to use Cartia XT at 240 mg daily, stop Toprol-XL which he has already done, and hold off on Benicar. He will recheck blood pressures at home and then we can decide if this medicine needs to be added back. We will also obtain a Lexiscan Cardiolite to reassess ischemic burden. Office follow-up arranged.

## 2014-05-21 NOTE — Progress Notes (Signed)
Reason for visit: CAD, hyperlipidemia  Clinical Summary Eric Beltran is a 74 y.o.male last seen in October 2014. He comes in for a follow-up visit today. He tells me that over the last few weeks he has been trying to work out at home using an elliptical machine. He has noticed that his heart rate has picked up fairly quickly and the 130s, usually within 5 minutes, and he has been experiencing angina with this. On his own, he stopped Toprol-XL and Benicar, and started taking an old bottle of Cardizem CD 360 mg daily. He feels somewhat tired with this intervention. I reviewed his old records, he had previously been on Toprol-XL 50 mg daily, and Benicar 40 mg daily. Prior to this when he was seeing Dr. Verl Beltran, he was on Cardizem CD and Altace. He has a history of SVT, also perioperative atrial fibrillation, no obvious recent arrhythmias. ECG today shows sinus rhythm with prolonged PR interval and old inferior infarct pattern, stable.  He continues to follow with Dr. Luan Beltran for lab work, continues on Lipitor for hyperlipidemia.  I rechecked his blood pressure today finding it to be 150/82 near the end of our examination.   No Known Allergies  Current Outpatient Prescriptions  Medication Sig Dispense Refill  . aspirin EC 81 MG tablet Take 81 mg by mouth at bedtime.     [name] atorvastatin (LIPITOR) 40 MG tablet TAKE 1 TABLET BY MOUTH AT BEDTIME 90 tablet 3  . Multiple Vitamin (MULTIVITAMIN) tablet Take 1 tablet by mouth at bedtime.     . nitroGLYCERIN (NITROSTAT) 0.4 MG SL tablet Place 1 tablet (0.4 mg total) under the tongue every 5 (five) minutes as needed for chest pain. 90 tablet 3  . Omega-3 Fatty Acids (FISH OIL) 1000 MG CAPS Take 1 capsule by mouth daily.      . tadalafil (CIALIS) 5 MG tablet Take 5 mg by mouth as needed for erectile dysfunction.    . Tamsulosin HCl (FLOMAX) 0.4 MG CAPS Take 0.4 mg by mouth 2 (two) times daily.     [name] acyclovir (ZOVIRAX) 800 MG tablet   11  . diltiazem (CARTIA XT)  240 MG 24 hr capsule Take 1 capsule (240 mg total) by mouth daily. 30 capsule 3   No current facility-administered medications for this visit.    Past Medical History  Diagnosis Date  . Mixed hyperlipidemia   . Essential hypertension, benign   . Coronary atherosclerosis of native coronary artery     Multivessel status post prior stenting and ultimately CABG 12/2011 at [facility]  . Supraventricular tachycardia   . Atrial fibrillation     Perioperative  . Ascending aortic aneurysm     Status post repair 12/2011 at [facility]  . BPH (benign prostatic hyperplasia)     Past Surgical History  Procedure Laterality Date  . Ascending aortic aneurysm repair  12/2011 [facility]    26 mm.Dacron graft  . Hernia repair    . Transurethral resection of prostate    . Coronary artery bypass graft  12/2011 [facility]    LIMA to LAD, SVG to PLB, SVG to ramus, SVG to OM    Social History Eric Beltran reports that he has never smoked. He has never used smokeless tobacco. Mr. Eric Beltran reports that he drinks alcohol.  Review of Systems Complete review of systems negative except as otherwise outlined in the clinical summary and also the following. Trying to look more at his diet and lose some weight. No orthopnea or PND.  No claudication.  Physical Examination Filed Vitals:   05/21/14 1545  BP: 110/62  Pulse: 63   Filed Weights   05/21/14 1545  Weight: 193 lb (87.544 kg)   Comfortable at rest. HEENT: Conjunctiva and lids normal, oropharynx clear with moist mucosa. Neck: Supple, no elevated JVP or carotid bruits, no thyromegaly. Lungs: Clear to auscultation, nonlabored breathing at rest. Cardiac: Regular rate and rhythm, no S3, 2/6 diastolic murmur at left base, no pericardial rub. Abdomen: Soft, nontender, bowel sounds present. Extremities: No pitting edema, distal pulses 2+. Skin: Warm and dry. Musculoskeletal: No kyphosis. Neuropsychiatric: Alert and oriented x3, affect grossly appropriate.   Problem List  and Plan   Coronary atherosclerosis of native coronary artery He is reporting some exertional angina symptoms as noted above. Medications were self-modified. I reviewed his old records and prior medical regimens. At this time our plan is to use Cartia XT at 240 mg daily, stop Toprol-XL which he has already done, and hold off on Benicar. He will recheck blood pressures at home and then we can decide if this medicine needs to be added back. We will also obtain a Lexiscan Cardiolite to reassess ischemic burden. Office follow-up arranged.  Essential hypertension, benign Watch blood pressure at home on Cartia XT 240 mg daily. We can always add back Benicar as well.  Cardiac murmur Diastolic murmur noted, will follow-up echocardiogram for reassessment. Could have aortic regurgitation with history of ascending aortic aneurysm repair.  Mixed hyperlipidemia He continues on Lipitor with lab work follow-up per Dr. Luan Beltran.    Satira Sark, M.D., F.A.C.C.

## 2014-05-21 NOTE — Assessment & Plan Note (Signed)
Watch blood pressure at home on Cartia XT 240 mg daily. We can always add back Benicar as well.

## 2014-05-21 NOTE — Patient Instructions (Signed)
Your physician recommends that you schedule a follow-up appointment in: 3 weeks. Your physician has recommended you make the following change in your medication:  Start cartia xt 240 mg daily. Continue all other medications the same. Your physician has requested that you have an echocardiogram. Echocardiography is a painless test that uses sound waves to create images of your heart. It provides your doctor with information about the size and shape of your heart and how well your heart's chambers and valves are working. This procedure takes approximately one hour. There are no restrictions for this procedure. Your physician has requested that you have a lexiscan myoview. For further information please visit HugeFiesta[facility]tn. Please follow instruction sheet, as given. Your physician has requested that you regularly monitor and record your blood pressure readings at home. Please use the same machine at the same time of day to check your readings and record them to bring to your follow-up visit.

## 2014-05-21 NOTE — Assessment & Plan Note (Signed)
He continues on Lipitor with lab work follow-up per Dr. Luan Pulling.

## 2014-05-21 NOTE — Assessment & Plan Note (Signed)
Diastolic murmur noted, will follow-up echocardiogram for reassessment. Could have aortic regurgitation with history of ascending aortic aneurysm repair.

## 2014-05-27 ENCOUNTER — Other Ambulatory Visit (INDEPENDENT_AMBULATORY_CARE_PROVIDER_SITE_OTHER): Payer: Medicare Other

## 2014-05-27 ENCOUNTER — Other Ambulatory Visit: Payer: Self-pay

## 2014-05-27 DIAGNOSIS — I25118 Atherosclerotic heart disease of native coronary artery with other forms of angina pectoris: Secondary | ICD-10-CM

## 2014-05-27 DIAGNOSIS — I4891 Unspecified atrial fibrillation: Secondary | ICD-10-CM

## 2014-05-27 DIAGNOSIS — R011 Cardiac murmur, unspecified: Secondary | ICD-10-CM

## 2014-06-03 ENCOUNTER — Telehealth: Payer: Self-pay | Admitting: *Deleted

## 2014-06-03 NOTE — Telephone Encounter (Signed)
Patient informed. 

## 2014-06-03 NOTE — Telephone Encounter (Signed)
-----   Message from Satira Sark, MD sent at 05/27/2014  5:19 PM EST ----- Reviewed report. LV systolic function remains normal, he has LVH with mild diastolic dysfunction, and we will continue to work on blood pressure control as treatment for this. He was mild aortic regurgitation (murmur on exam), but I do not suspect that this is symptom provoking. Aortic root is mildly dilated after prior repair. We will follow-up on stress testing.

## 2014-06-07 ENCOUNTER — Encounter (HOSPITAL_COMMUNITY)
Admission: RE | Admit: 2014-06-07 | Discharge: 2014-06-07 | Disposition: A | Discharge status: Home / Self Care | Payer: Medicare Other | Source: Ambulatory Visit | Attending: Cardiology | Admitting: Cardiology

## 2014-06-07 ENCOUNTER — Ambulatory Visit (HOSPITAL_COMMUNITY)
Admission: RE | Admit: 2014-06-07 | Discharge: 2014-06-07 | Disposition: A | Discharge status: Home / Self Care | Payer: Medicare Other | Source: Ambulatory Visit | Attending: Cardiology | Admitting: Cardiology

## 2014-06-07 ENCOUNTER — Encounter (HOSPITAL_COMMUNITY): Payer: Self-pay

## 2014-06-07 DIAGNOSIS — I25810 Atherosclerosis of coronary artery bypass graft(s) without angina pectoris: Secondary | ICD-10-CM

## 2014-06-07 DIAGNOSIS — I2510 Atherosclerotic heart disease of native coronary artery without angina pectoris: Secondary | ICD-10-CM | POA: Insufficient documentation

## 2014-06-07 DIAGNOSIS — I25118 Atherosclerotic heart disease of native coronary artery with other forms of angina pectoris: Secondary | ICD-10-CM

## 2014-06-07 DIAGNOSIS — R0609 Other forms of dyspnea: Secondary | ICD-10-CM | POA: Diagnosis not present

## 2014-06-07 MED ORDER — REGADENOSON 0.4 MG/5ML IV SOLN
INTRAVENOUS | Status: AC
  Administered 2014-06-07: 0.4 mg via INTRAVENOUS
  Filled 2014-06-07: qty 5

## 2014-06-07 MED ORDER — SODIUM CHLORIDE 0.9 % IJ SOLN
INTRAMUSCULAR | Status: AC
  Administered 2014-06-07: 10 mL via INTRAVENOUS
  Filled 2014-06-07: qty 3

## 2014-06-07 MED ORDER — SODIUM CHLORIDE 0.9 % IJ SOLN
10.0000 mL | INTRAMUSCULAR | Status: DC | PRN
  Administered 2014-06-07: 10 mL via INTRAVENOUS
  Filled 2014-06-07: qty 10

## 2014-06-07 MED ORDER — TECHNETIUM TC 99M SESTAMIBI - CARDIOLITE
30.0000 | Freq: Once | INTRAVENOUS | Status: AC | PRN
  Administered 2014-06-07: 30 via INTRAVENOUS

## 2014-06-07 MED ORDER — TECHNETIUM TC 99M SESTAMIBI GENERIC - CARDIOLITE
10.0000 | Freq: Once | INTRAVENOUS | Status: AC | PRN
  Administered 2014-06-07: 10 via INTRAVENOUS

## 2014-06-07 MED ORDER — REGADENOSON 0.4 MG/5ML IV SOLN
0.4000 mg | Freq: Once | INTRAVENOUS | Status: AC | PRN
  Administered 2014-06-07: 0.4 mg via INTRAVENOUS

## 2014-06-07 MED ORDER — SODIUM CHLORIDE 0.9 % IJ SOLN
INTRAMUSCULAR | Status: AC
  Filled 2014-06-07: qty 36

## 2014-06-07 NOTE — Progress Notes (Signed)
Stress Lab Nurses Notes - [place] 06/07/2014 Reason for doing test: CAD and DOE Type of test: Wille Glaser Nurse performing test: Gerrit Halls, RN Nuclear Medicine Tech: Dyanne Carrel Echo Tech: Not Applicable MD performing test: Koneswaran/K.Purcell Nails NP Family MD: Luan Pulling Test explained and consent signed: Yes.   IV started: Saline lock flushed, No redness or edema and Saline lock started in radiology Symptoms: SOB Treatment/Intervention: None Reason test stopped: protocol completed After recovery IV was: Discontinued via X-ray tech and No redness or edema Patient to return to Nuc. Med at :12:00 Patient discharged: Home Patient's Condition upon discharge was: stable Comments: During test BP 151/83 & HR 82.  Recovery BP 157/97 & HR 67 .  Symptoms resolved in recovery Geanie Cooley T

## 2014-06-09 ENCOUNTER — Encounter: Payer: Self-pay | Admitting: *Deleted

## 2014-06-09 ENCOUNTER — Ambulatory Visit (INDEPENDENT_AMBULATORY_CARE_PROVIDER_SITE_OTHER): Payer: Medicare Other | Admitting: Cardiology

## 2014-06-09 ENCOUNTER — Encounter: Payer: Self-pay | Admitting: Cardiology

## 2014-06-09 ENCOUNTER — Telehealth: Payer: Self-pay | Admitting: Cardiology

## 2014-06-09 ENCOUNTER — Other Ambulatory Visit: Payer: Self-pay | Admitting: Cardiology

## 2014-06-09 VITALS — BP 136/82 | HR 60 | Ht 67.0 in | Wt 192.0 lb

## 2014-06-09 DIAGNOSIS — R011 Cardiac murmur, unspecified: Secondary | ICD-10-CM

## 2014-06-09 DIAGNOSIS — I10 Essential (primary) hypertension: Secondary | ICD-10-CM

## 2014-06-09 DIAGNOSIS — I25118 Atherosclerotic heart disease of native coronary artery with other forms of angina pectoris: Secondary | ICD-10-CM

## 2014-06-09 DIAGNOSIS — R9439 Abnormal result of other cardiovascular function study: Secondary | ICD-10-CM | POA: Insufficient documentation

## 2014-06-09 DIAGNOSIS — I712 Thoracic aortic aneurysm, without rupture, unspecified: Secondary | ICD-10-CM

## 2014-06-09 DIAGNOSIS — R9430 Abnormal result of cardiovascular function study, unspecified: Secondary | ICD-10-CM

## 2014-06-09 NOTE — Assessment & Plan Note (Signed)
Status post CABG at [facility] in 2013 as outlined above.

## 2014-06-09 NOTE — Telephone Encounter (Signed)
Pt has Blue MCR.  No precert required for heart cath.

## 2014-06-09 NOTE — Telephone Encounter (Signed)
Left heart cath & possible dx:CAD and abnormal stress cardiolite Wednesday, June 18, 2014 @10 :30 with Dr. Burt Knack Checking percert

## 2014-06-09 NOTE — Assessment & Plan Note (Signed)
Status post replacement of the ascending aorta with a 26 mm woven-chronic graft, August 2013 by Dr. Anne Shutter, [facility].

## 2014-06-09 NOTE — Assessment & Plan Note (Signed)
Medications adjusted at the last visit. For now continue current regimen, we will probably put him back on ARB after angiography.

## 2014-06-09 NOTE — Patient Instructions (Signed)
Your physician recommends that you schedule a follow-up appointment in: after cath. Your physician recommends that you continue on your current medications as directed. Please refer to the Current Medication list given to you today. Your physician has requested that you have a cardiac catheterization. Cardiac catheterization is used to diagnose and/or treat various heart conditions. Doctors may recommend this procedure for a number of different reasons. The most common reason is to evaluate chest pain. Chest pain can be a symptom of coronary artery disease (CAD), and cardiac catheterization can show whether plaque is narrowing or blocking your heart's arteries. This procedure is also used to evaluate the valves, as well as measure the blood flow and oxygen levels in different parts of your heart. For further information please visit HugeFiesta[facility]tn. Please follow instruction sheet, as given.

## 2014-06-09 NOTE — Progress Notes (Signed)
Reason for visit: Follow-up testing  Clinical Summary Mr. Venne is a 74 y.o.male seen recently in January of this year. At that time we made adjustments in his medications with symptoms of increasing angina. He was also referred for follow-up ischemic testing.  Echocardiogram from January showed severe LVH with LVEF 23-53%, grade 1 diastolic dysfunction, sclerotic aortic valve with mild aortic regurgitation, dilated aortic root at 41 mm, severely dilated left atrium. We reviewed these results today.  Lexiscan Cardiolite from January revealed a moderate region of lateral wall ischemia extending from apex to base associated with hypokinesis, LVEF 57%, overall intermediate risk study. This would suggest potentially progressive graft disease since his CABG at [facility] in 2013. We also discussed this today. He is still having angina symptoms, certainly we can continue to adjust medications, but question is whether there is any revascularizable lesion that may further assist with symptom control. With intermediate risk abnormality, we discussed proceeding to a heart catheterization for further assessment. He is in agreement.  Blood pressure still not optimally controlled at home, although looked better today. He was holding Benicar as of the last time, we discussed potentially starting this back, although have decided to hold off until after his angiography.  No Known Allergies  Current Outpatient Prescriptions  Medication Sig Dispense Refill  . acyclovir (ZOVIRAX) 800 MG tablet Take 800 mg by mouth as needed.   11  . aspirin EC 81 MG tablet Take 81 mg by mouth at bedtime.     [name] atorvastatin (LIPITOR) 40 MG tablet TAKE 1 TABLET BY MOUTH AT BEDTIME 90 tablet 3  . diltiazem (CARTIA XT) 240 MG 24 hr capsule Take 1 capsule (240 mg total) by mouth daily. 30 capsule 3  . Multiple Vitamin (MULTIVITAMIN) tablet Take 1 tablet by mouth at bedtime.     . nitroGLYCERIN (NITROSTAT) 0.4 MG SL tablet Place 1 tablet  (0.4 mg total) under the tongue every 5 (five) minutes as needed for chest pain. 90 tablet 3  . Omega-3 Fatty Acids (FISH OIL) 1000 MG CAPS Take 1 capsule by mouth daily.      . tadalafil (CIALIS) 5 MG tablet Take 5 mg by mouth as needed for erectile dysfunction.    . Tamsulosin HCl (FLOMAX) 0.4 MG CAPS Take 0.4 mg by mouth 2 (two) times daily.      No current facility-administered medications for this visit.    Past Medical History  Diagnosis Date  . Mixed hyperlipidemia   . Essential hypertension, benign   . Coronary atherosclerosis of native coronary artery     Multivessel status post prior stenting and ultimately CABG 12/2011 at [facility]  . Supraventricular tachycardia   . Atrial fibrillation     Perioperative  . Ascending aortic aneurysm     Status post repair 12/2011 at [facility]  . BPH (benign prostatic hyperplasia)     Past Surgical History  Procedure Laterality Date  . Ascending aortic aneurysm repair  12/2011 [facility]    26 mm.Dacron graft  . Hernia repair    . Transurethral resection of prostate    . Coronary artery bypass graft  12/2011 [facility]    LIMA to LAD, SVG to PLB, SVG to ramus, SVG to OM    History reviewed. No pertinent family history.  Social History Mr. Binette reports that he has never smoked. He has never used smokeless tobacco. Mr. Kunesh reports that he drinks alcohol.  Review of Systems Complete review of systems negative except as otherwise outlined in  the clinical summary and also the following. No palpitations or syncope. No unusual bleeding episodes.  Physical Examination Filed Vitals:   06/09/14 1522  BP: 136/82  Pulse: 60    Wt Readings from Last 3 Encounters:  06/09/14 192 lb (87.091 kg)  05/21/14 193 lb (87.544 kg)  03/02/13 188 lb 6.4 oz (85.458 kg)   Comfortable at rest. HEENT: Conjunctiva and lids normal, oropharynx clear with moist mucosa. Neck: Supple, no elevated JVP or carotid bruits, no thyromegaly. Lungs: Clear to auscultation,  nonlabored breathing at rest. Cardiac: Regular rate and rhythm, no S3, 2/6 diastolic murmur at left base, no pericardial rub. Abdomen: Soft, nontender, bowel sounds present. Extremities: No pitting edema, distal pulses 2+. Skin: Warm and dry. Musculoskeletal: No kyphosis. Neuropsychiatric: Alert and oriented x3, affect grossly appropriate.   Problem List and Plan   Abnormal myocardial perfusion study Recent intermediate risk study with moderate sized region of lateral ischemia, suspect graft disease at this point. Plan is to proceed with cardiac catheterization next week, scheduled with Dr. Burt Knack, to assess for revascularization options. Mr. Degroote is in agreement to proceed.   Cardiac murmur Mild aortic regurgitation in the setting of mild aortic root dilatation, diastolic murmur or examination. Continue observation.   Essential hypertension, benign Medications adjusted at the last visit. For now continue current regimen, we will probably put him back on ARB after angiography.   Coronary atherosclerosis of native coronary artery Status post CABG at [facility] in 2013 as outlined above.   Aneurysm of thoracic aorta Status post replacement of the ascending aorta with a 26 mm woven-chronic graft, August 2013 by Dr. Anne Shutter, [facility].     Satira Sark, M.D., F.A.C.C.

## 2014-06-09 NOTE — Assessment & Plan Note (Signed)
Mild aortic regurgitation in the setting of mild aortic root dilatation, diastolic murmur or examination. Continue observation.

## 2014-06-09 NOTE — Assessment & Plan Note (Signed)
Recent intermediate risk study with moderate sized region of lateral ischemia, suspect graft disease at this point. Plan is to proceed with cardiac catheterization next week, scheduled with Dr. Burt Knack, to assess for revascularization options. Eric Beltran is in agreement to proceed.

## 2014-06-10 NOTE — Telephone Encounter (Signed)
No precert required 

## 2014-06-18 ENCOUNTER — Ambulatory Visit (HOSPITAL_COMMUNITY)
Admission: RE | Admit: 2014-06-18 | Discharge: 2014-06-18 | Disposition: A | Discharge status: Home / Self Care | Payer: Medicare Other | Source: Ambulatory Visit | Attending: Cardiovascular Disease | Admitting: Cardiovascular Disease

## 2014-06-18 ENCOUNTER — Encounter (HOSPITAL_COMMUNITY)
Admission: RE | Disposition: A | Discharge status: Home / Self Care | Payer: Self-pay | Source: Ambulatory Visit | Attending: Cardiovascular Disease

## 2014-06-18 ENCOUNTER — Encounter (HOSPITAL_COMMUNITY): Payer: Self-pay | Admitting: Cardiovascular Disease

## 2014-06-18 DIAGNOSIS — E782 Mixed hyperlipidemia: Secondary | ICD-10-CM | POA: Insufficient documentation

## 2014-06-18 DIAGNOSIS — Z79899 Other long term (current) drug therapy: Secondary | ICD-10-CM | POA: Insufficient documentation

## 2014-06-18 DIAGNOSIS — I2510 Atherosclerotic heart disease of native coronary artery without angina pectoris: Secondary | ICD-10-CM | POA: Diagnosis present

## 2014-06-18 DIAGNOSIS — Z955 Presence of coronary angioplasty implant and graft: Secondary | ICD-10-CM | POA: Diagnosis not present

## 2014-06-18 DIAGNOSIS — I10 Essential (primary) hypertension: Secondary | ICD-10-CM | POA: Diagnosis not present

## 2014-06-18 DIAGNOSIS — Z951 Presence of aortocoronary bypass graft: Secondary | ICD-10-CM | POA: Diagnosis not present

## 2014-06-18 DIAGNOSIS — I4891 Unspecified atrial fibrillation: Secondary | ICD-10-CM | POA: Diagnosis not present

## 2014-06-18 DIAGNOSIS — I351 Nonrheumatic aortic (valve) insufficiency: Secondary | ICD-10-CM

## 2014-06-18 DIAGNOSIS — R9430 Abnormal result of cardiovascular function study, unspecified: Secondary | ICD-10-CM | POA: Insufficient documentation

## 2014-06-18 DIAGNOSIS — Z7982 Long term (current) use of aspirin: Secondary | ICD-10-CM | POA: Insufficient documentation

## 2014-06-18 DIAGNOSIS — N400 Enlarged prostate without lower urinary tract symptoms: Secondary | ICD-10-CM | POA: Diagnosis not present

## 2014-06-18 HISTORY — PX: LEFT HEART CATHETERIZATION WITH CORONARY ANGIOGRAM: SHX5451

## 2014-06-18 LAB — CBC
HCT: 41.9 % (ref 39.0–52.0)
Hemoglobin: 14.2 g/dL (ref 13.0–17.0)
MCH: 28.6 pg (ref 26.0–34.0)
MCHC: 33.9 g/dL (ref 30.0–36.0)
MCV: 84.3 fL (ref 78.0–100.0)
PLATELETS: 217 10*3/uL (ref 150–400)
RBC: 4.97 MIL/uL (ref 4.22–5.81)
RDW: 14.8 % (ref 11.5–15.5)
WBC: 7 10*3/uL (ref 4.0–10.5)

## 2014-06-18 LAB — BASIC METABOLIC PANEL
Anion gap: 5 (ref 5–15)
BUN: 17 mg/dL (ref 6–23)
CHLORIDE: 110 mmol/L (ref 96–112)
CO2: 25 mmol/L (ref 19–32)
Calcium: 8.8 mg/dL (ref 8.4–10.5)
Creatinine, Ser: 0.99 mg/dL (ref 0.50–1.35)
GFR calc non Af Amer: 80 mL/min — ABNORMAL LOW (ref >90)
Glucose, Bld: 105 mg/dL — ABNORMAL HIGH (ref 70–99)
Potassium: 3.9 mmol/L (ref 3.5–5.1)
Sodium: 140 mmol/L (ref 135–145)

## 2014-06-18 LAB — PROTIME-INR
INR: 1.17 (ref 0.00–1.49)
PROTHROMBIN TIME: 15.1 s (ref 11.6–15.2)

## 2014-06-18 SURGERY — LEFT HEART CATHETERIZATION WITH CORONARY ANGIOGRAM
Anesthesia: LOCAL

## 2014-06-18 MED ORDER — ONDANSETRON HCL 4 MG/2ML IJ SOLN: 4.0000 mg | Freq: Four times a day (QID) | INTRAMUSCULAR | Status: DC | PRN

## 2014-06-18 MED ORDER — VERAPAMIL HCL 2.5 MG/ML IV SOLN
INTRAVENOUS | Status: AC
  Filled 2014-06-18: qty 2

## 2014-06-18 MED ORDER — NITROGLYCERIN 1 MG/10 ML FOR IR/CATH LAB
INTRA_ARTERIAL | Status: AC
  Filled 2014-06-18: qty 10

## 2014-06-18 MED ORDER — SODIUM CHLORIDE 0.9 % IV SOLN: 1.0000 mL/kg/h | INTRAVENOUS | Status: DC

## 2014-06-18 MED ORDER — ACETAMINOPHEN 325 MG PO TABS: 650.0000 mg | ORAL_TABLET | ORAL | Status: DC | PRN

## 2014-06-18 MED ORDER — MIDAZOLAM HCL 2 MG/2ML IJ SOLN
INTRAMUSCULAR | Status: AC
  Filled 2014-06-18: qty 2

## 2014-06-18 MED ORDER — SODIUM CHLORIDE 0.9 % IJ SOLN
3.0000 mL | Freq: Two times a day (BID) | INTRAMUSCULAR | Status: DC
Start: 2014-06-18 — End: 2014-06-18

## 2014-06-18 MED ORDER — SODIUM CHLORIDE 0.9 % IJ SOLN: 3.0000 mL | INTRAMUSCULAR | Status: DC | PRN

## 2014-06-18 MED ORDER — FENTANYL CITRATE 0.05 MG/ML IJ SOLN
INTRAMUSCULAR | Status: AC
  Filled 2014-06-18: qty 2

## 2014-06-18 MED ORDER — HEPARIN (PORCINE) IN NACL 2-0.9 UNIT/ML-% IJ SOLN
INTRAMUSCULAR | Status: AC
  Filled 2014-06-18: qty 1500

## 2014-06-18 MED ORDER — SODIUM CHLORIDE 0.9 % IV SOLN
INTRAVENOUS | Status: DC
  Administered 2014-06-18: 09:00:00 via INTRAVENOUS

## 2014-06-18 MED ORDER — SODIUM CHLORIDE 0.9 % IV SOLN: 250.0000 mL | INTRAVENOUS | Status: DC | PRN

## 2014-06-18 MED ORDER — ASPIRIN 81 MG PO CHEW: 81.0000 mg | CHEWABLE_TABLET | ORAL | Status: DC

## 2014-06-18 MED ORDER — HEPARIN SODIUM (PORCINE) 1000 UNIT/ML IJ SOLN
INTRAMUSCULAR | Status: AC
  Filled 2014-06-18: qty 1

## 2014-06-18 MED ORDER — LIDOCAINE HCL (PF) 1 % IJ SOLN
INTRAMUSCULAR | Status: AC
  Filled 2014-06-18: qty 30

## 2014-06-18 NOTE — H&P (View-Only) (Signed)
Reason for visit: Follow-up testing  Clinical Summary Eric Beltran is a 74 y.o.male seen recently in January of this year. At that time we made adjustments in his medications with symptoms of increasing angina. He was also referred for follow-up ischemic testing.  Echocardiogram from January showed severe LVH with LVEF 40-98%, grade 1 diastolic dysfunction, sclerotic aortic valve with mild aortic regurgitation, dilated aortic root at 41 mm, severely dilated left atrium. We reviewed these results today.  Lexiscan Cardiolite from January revealed a moderate region of lateral wall ischemia extending from apex to base associated with hypokinesis, LVEF 57%, overall intermediate risk study. This would suggest potentially progressive graft disease since his CABG at [facility] in 2013. We also discussed this today. He is still having angina symptoms, certainly we can continue to adjust medications, but question is whether there is any revascularizable lesion that may further assist with symptom control. With intermediate risk abnormality, we discussed proceeding to a heart catheterization for further assessment. He is in agreement.  Blood pressure still not optimally controlled at home, although looked better today. He was holding Benicar as of the last time, we discussed potentially starting this back, although have decided to hold off until after his angiography.  No Known Allergies  Current Outpatient Prescriptions  Medication Sig Dispense Refill  . acyclovir (ZOVIRAX) 800 MG tablet Take 800 mg by mouth as needed.   11  . aspirin EC 81 MG tablet Take 81 mg by mouth at bedtime.     [name] atorvastatin (LIPITOR) 40 MG tablet TAKE 1 TABLET BY MOUTH AT BEDTIME 90 tablet 3  . diltiazem (CARTIA XT) 240 MG 24 hr capsule Take 1 capsule (240 mg total) by mouth daily. 30 capsule 3  . Multiple Vitamin (MULTIVITAMIN) tablet Take 1 tablet by mouth at bedtime.     . nitroGLYCERIN (NITROSTAT) 0.4 MG SL tablet Place 1 tablet  (0.4 mg total) under the tongue every 5 (five) minutes as needed for chest pain. 90 tablet 3  . Omega-3 Fatty Acids (FISH OIL) 1000 MG CAPS Take 1 capsule by mouth daily.      . tadalafil (CIALIS) 5 MG tablet Take 5 mg by mouth as needed for erectile dysfunction.    . Tamsulosin HCl (FLOMAX) 0.4 MG CAPS Take 0.4 mg by mouth 2 (two) times daily.      No current facility-administered medications for this visit.    Past Medical History  Diagnosis Date  . Mixed hyperlipidemia   . Essential hypertension, benign   . Coronary atherosclerosis of native coronary artery     Multivessel status post prior stenting and ultimately CABG 12/2011 at [facility]  . Supraventricular tachycardia   . Atrial fibrillation     Perioperative  . Ascending aortic aneurysm     Status post repair 12/2011 at [facility]  . BPH (benign prostatic hyperplasia)     Past Surgical History  Procedure Laterality Date  . Ascending aortic aneurysm repair  12/2011 [facility]    26 mm.Dacron graft  . Hernia repair    . Transurethral resection of prostate    . Coronary artery bypass graft  12/2011 [facility]    LIMA to LAD, SVG to PLB, SVG to ramus, SVG to OM    History reviewed. No pertinent family history.  Social History Eric Beltran reports that he has never smoked. He has never used smokeless tobacco. Eric Beltran reports that he drinks alcohol.  Review of Systems Complete review of systems negative except as otherwise outlined in  the clinical summary and also the following. No palpitations or syncope. No unusual bleeding episodes.  Physical Examination Filed Vitals:   06/09/14 1522  BP: 136/82  Pulse: 60    Wt Readings from Last 3 Encounters:  06/09/14 192 lb (87.091 kg)  05/21/14 193 lb (87.544 kg)  03/02/13 188 lb 6.4 oz (85.458 kg)   Comfortable at rest. HEENT: Conjunctiva and lids normal, oropharynx clear with moist mucosa. Neck: Supple, no elevated JVP or carotid bruits, no thyromegaly. Lungs: Clear to auscultation,  nonlabored breathing at rest. Cardiac: Regular rate and rhythm, no S3, 2/6 diastolic murmur at left base, no pericardial rub. Abdomen: Soft, nontender, bowel sounds present. Extremities: No pitting edema, distal pulses 2+. Skin: Warm and dry. Musculoskeletal: No kyphosis. Neuropsychiatric: Alert and oriented x3, affect grossly appropriate.   Problem List and Plan   Abnormal myocardial perfusion study Recent intermediate risk study with moderate sized region of lateral ischemia, suspect graft disease at this point. Plan is to proceed with cardiac catheterization next week, scheduled with Dr. Burt Knack, to assess for revascularization options. Eric Beltran is in agreement to proceed.   Cardiac murmur Mild aortic regurgitation in the setting of mild aortic root dilatation, diastolic murmur or examination. Continue observation.   Essential hypertension, benign Medications adjusted at the last visit. For now continue current regimen, we will probably put him back on ARB after angiography.   Coronary atherosclerosis of native coronary artery Status post CABG at [facility] in 2013 as outlined above.   Aneurysm of thoracic aorta Status post replacement of the ascending aorta with a 26 mm woven-chronic graft, August 2013 by Dr. Anne Shutter, [facility].     Satira Sark, M.D., F.A.C.C.

## 2014-06-18 NOTE — Interval H&P Note (Signed)
History and Physical Interval Note:  06/18/2014 11:57 AM  Eric Beltran  has presented today for surgery, with the diagnosis of cad    abn cardiolite  The various methods of treatment have been discussed with the patient and family. After consideration of risks, benefits and other options for treatment, the patient has consented to  Procedure(s): LEFT HEART CATHETERIZATION WITH CORONARY ANGIOGRAM (N/A) as a surgical intervention .  The patient's history has been reviewed, patient examined, no change in status, stable for surgery.  I have reviewed the patient's chart and labs.  Questions were answered to the patient's satisfaction.    Cath Lab Visit (complete for each Cath Lab visit)  Clinical Evaluation Leading to the Procedure:   ACS: No.  Non-ACS:    Anginal Classification: CCS III  Anti-ischemic medical therapy: Minimal Therapy (1 class of medications)  Non-Invasive Test Results: Intermediate-risk stress test findings: cardiac mortality 1-3%/year  Prior CABG: Previous CABG       Eric Beltran

## 2014-06-18 NOTE — CV Procedure (Signed)
    Cardiac Catheterization Procedure Note  Name: Eric Beltran MRN: [id number] DOB: [birth year]  Procedure: Left Heart Cath, Selective Coronary Angiography, LV angiography, LIMA angiography, SVG angiography, aortic root angiography  Indication: Exertional chest pain, abnormal Myoview scan   Procedural Details: The left wrist was prepped, draped, and anesthetized with 1% lidocaine. Using the modified Seldinger technique, a 5/6 French Slender sheath was introduced into the left radial artery. 3 mg of verapamil was administered through the sheath, weight-based unfractionated heparin was administered intravenously. Standard Judkins catheters were used for selective coronary angiography and left ventriculography. A JR 4 catheter was used for LIMA angiography. SVG angiography was performed with an AL-3 catheter but the grafts were difficult to reach. Aortic root angiography was performed. Catheter exchanges were performed over an exchange length guidewire. There were no immediate procedural complications. A TR band was used for radial hemostasis at the completion of the procedure.  The patient was transferred to the post catheterization recovery area for further monitoring.  Procedural Findings: Hemodynamics: AO 139/71 LV 140/19  Coronary angiography: Coronary dominance: right  Left mainstem: Patent vessel, no significant stenosis  Left anterior descending (LAD): 80% proximal stenosis. The diagonals and septals are patent. The mid-LAD fills from the LIMA  Left circumflex (LCx): Total occlusion at the ostium. Ostial ramus stent also occluded.  LIMA-LAD: widely patent without stenosis. Fills the mid/distal LAD and supplies a collateral to the intermediate branch  All SVG's are occluded at the aorta.  Right coronary artery (RCA): Large dominant vessel, heavily calcified. There is diffuse 40% in-stent restenosis through the mid vessel. The distal vessel has 50% stenosis before the bifurcation of  the PDA and PLA. The branch vessels of the RCA are widely patnet and there is a well formed collateral to the left circumflex.  Left ventriculography: Left ventricular systolic function is normal, LVEF is estimated at 55%, there is no significant mitral regurgitation   Aortic root angio: the aortic sinuses are dilated and there is moderate AI. The ascending aorta also appears dilated.  Estimated Blood Loss: minimal  Final Conclusions:   1. 3 vessel CAD with patency of the RCA stents and mild to moderate obstruction, total occlusion of the LCx, and severe stenosis of the proximal LAD 2. S/P multivessel CABG with continued patency of the LIMA-LAD and total occlusion of the SVG's 3. Extensive collateralization of the LCx from the RCA and LIMA-LAD 4. Preserved LV function 5. Dilated aortic sinuses with moderate AI  Recommendations: Medical therapy. I don't see any feasible way to percutaneously revascularize the left circumflex territory  [name] MD, [facility], [place] 06/18/2014, 12:44 PM

## 2014-06-18 NOTE — Discharge Instructions (Signed)
Radial Site Care Refer to this sheet in the next few weeks. These instructions provide you with information on caring for yourself after your procedure. Your caregiver may also give you more specific instructions. Your treatment has been planned according to current medical practices, but problems sometimes occur. Call your caregiver if you have any problems or questions after your procedure. HOME CARE INSTRUCTIONS  You may shower the day after the procedure.Remove the bandage (dressing) and gently wash the site with plain soap and water.Gently pat the site dry.  Do not apply powder or lotion to the site.  Do not submerge the affected site in water for 3 to 5 days.  Inspect the site at least twice daily.  Do not flex or bend the affected arm for 24 hours.  No lifting over 5 pounds (2.3 kg) for 5 days after your procedure.  Do not drive home if you are discharged the same day of the procedure. Have someone else drive you.  You may drive 24 hours after the procedure unless otherwise instructed by your caregiver.  Do not operate machinery or power tools for 24 hours.  A responsible adult should be with you for the first 24 hours after you arrive home. What to expect:  Any bruising will usually fade within 1 to 2 weeks.  Blood that collects in the tissue (hematoma) may be painful to the touch. It should usually decrease in size and tenderness within 1 to 2 weeks. SEEK IMMEDIATE MEDICAL CARE IF:  You have unusual pain at the radial site.  You have redness, warmth, swelling, or pain at the radial site.  You have drainage (other than a small amount of blood on the dressing).  You have chills.  You have a fever or persistent symptoms for more than 72 hours.  You have a fever and your symptoms suddenly get worse.  Your arm becomes pale, cool, tingly, or numb.  You have heavy bleeding from the site. Hold pressure on the site. Call 911 Document Released: 06/02/2010 Document  Revised: 07/23/2011 Document Reviewed: 06/02/2010 [facility] Patient Information 2015 [place], [place]. This information is not intended to replace advice given to you by your health care provider. Make sure you discuss any questions you have with your health care provider.

## 2014-06-19 DIAGNOSIS — R9430 Abnormal result of cardiovascular function study, unspecified: Secondary | ICD-10-CM | POA: Insufficient documentation

## 2014-07-07 ENCOUNTER — Encounter: Payer: Self-pay | Admitting: Cardiology

## 2014-07-07 ENCOUNTER — Ambulatory Visit (INDEPENDENT_AMBULATORY_CARE_PROVIDER_SITE_OTHER): Payer: Medicare Other | Admitting: Cardiology

## 2014-07-07 VITALS — BP 124/66 | HR 53 | Ht 68.0 in | Wt 191.0 lb

## 2014-07-07 DIAGNOSIS — I25709 Atherosclerosis of coronary artery bypass graft(s), unspecified, with unspecified angina pectoris: Secondary | ICD-10-CM

## 2014-07-07 DIAGNOSIS — I10 Essential (primary) hypertension: Secondary | ICD-10-CM

## 2014-07-07 DIAGNOSIS — I471 Supraventricular tachycardia: Secondary | ICD-10-CM

## 2014-07-07 NOTE — Progress Notes (Signed)
Cardiology Office Note  Date: 07/07/2014   ID: GERARDO CAIAZZO, DOB 03/07/1941, MRN [id number]  PCP: Alonza Bogus, MD  Primary Cardiologist: Rozann Lesches, MD   Chief Complaint  Patient presents with  . Coronary Artery Disease  . Hypertension    History of Present Illness: Eric Beltran is a 74 y.o. male seen in January for follow-up of an abnormal Lexiscan Cardiolite and increasing angina symptoms. He was referred for a follow-up cardiac catheterization, report documented below. Medical therapy was recommended without obvious revascularization options noted per Dr. Burt Knack.  He presents today for a routine visit. We discussed in detail the results of his cardiac catheterization. He is not reporting any progressive angina now, we reviewed his medications. Still notices ups and downs in his blood pressure at home, and I encouraged him to keep a log of blood pressure for [place] to review. He is not reporting any palpitations or increasing shortness of breath.   Past Medical History  Diagnosis Date  . Mixed hyperlipidemia   . Essential hypertension, benign   . Coronary atherosclerosis of native coronary artery     Multivessel status post prior stenting and ultimately CABG 12/2011 at [facility]  . Supraventricular tachycardia   . Atrial fibrillation     Perioperative  . Ascending aortic aneurysm     Status post repair 12/2011 at [facility]  . BPH (benign prostatic hyperplasia)     Past Surgical History  Procedure Laterality Date  . Ascending aortic aneurysm repair  12/2011 [facility]    26 mm.Dacron graft  . Hernia repair    . Transurethral resection of prostate    . Coronary artery bypass graft  12/2011 [facility]    LIMA to LAD, SVG to PLB, SVG to ramus, SVG to OM  . Left heart catheterization with coronary angiogram N/A 06/18/2014    Procedure: LEFT HEART CATHETERIZATION WITH CORONARY ANGIOGRAM;  Surgeon: Blane Ohara, MD;  Location: [facility] CATH LAB;  Service: Cardiovascular;  Laterality:  N/A;    Current Outpatient Prescriptions  Medication Sig Dispense Refill  . acyclovir (ZOVIRAX) 800 MG tablet Take 400 mg by mouth daily as needed (outbreak).   11  . aspirin EC 81 MG tablet Take 81 mg by mouth at bedtime.     [name] atorvastatin (LIPITOR) 40 MG tablet TAKE 1 TABLET BY MOUTH AT BEDTIME 90 tablet 3  . diltiazem (CARTIA XT) 240 MG 24 hr capsule Take 1 capsule (240 mg total) by mouth daily. 30 capsule 3  . Multiple Vitamin (MULTIVITAMIN) tablet Take 1 tablet by mouth at bedtime.     . naproxen (NAPROSYN) 500 MG tablet Take 500 mg by mouth 2 (two) times daily as needed (pain).    . nitroGLYCERIN (NITROSTAT) 0.4 MG SL tablet Place 1 tablet (0.4 mg total) under the tongue every 5 (five) minutes as needed for chest pain. 90 tablet 3  . Omega-3 Fatty Acids (FISH OIL) 1000 MG CAPS Take 1 capsule by mouth daily.      . tadalafil (CIALIS) 5 MG tablet Take 5 mg by mouth as needed for erectile dysfunction.    . Tamsulosin HCl (FLOMAX) 0.4 MG CAPS Take 0.4 mg by mouth daily.      No current facility-administered medications for this visit.    Allergies:  Review of patient's allergies indicates no known allergies.   Social History: The patient  reports that he has never smoked. He has never used smokeless tobacco. He reports that he drinks alcohol. He reports  that he does not use illicit drugs.   ROS:  Please see the history of present illness. Otherwise, complete review of systems is positive for none.  All other systems are reviewed and negative.    Physical Exam: VS:  There were no vitals taken for this visit., BMI There is no weight on file to calculate BMI.  Wt Readings from Last 3 Encounters:  06/09/14 192 lb (87.091 kg)  05/21/14 193 lb (87.544 kg)  03/02/13 188 lb 6.4 oz (85.458 kg)     General: Patient appears comfortable at rest. HEENT: Conjunctiva and lids normal, oropharynx clear with moist mucosa. Neck: Supple, no elevated JVP or carotid bruits, no thyromegaly. Lungs:  Clear to auscultation, nonlabored breathing at rest. Cardiac: Regular rate and rhythm, no S3 or significant systolic murmur, no pericardial rub. Abdomen: Soft, nontender, no hepatomegaly, bowel sounds present, no guarding or rebound. Extremities: No pitting edema, distal pulses 2+. Skin: Warm and dry. Musculoskeletal: No kyphosis. Neuropsychiatric: Alert and oriented x3, affect grossly appropriate.   ECG: ECG is not ordered today.   Recent Labwork: 06/18/2014: BUN 17; Creatinine 0.99; Hemoglobin 14.2; Platelets 217; Potassium 3.9; Sodium 140    Other Studies Reviewed Today:  Cardiac catheterization 06/18/2014: Procedure: Left Heart Cath, Selective Coronary Angiography, LV angiography, LIMA angiography, SVG angiography, aortic root angiography  Indication: Exertional chest pain, abnormal Myoview scan  Procedural Details: The left wrist was prepped, draped, and anesthetized with 1% lidocaine. Using the modified Seldinger technique, a 5/6 French Slender sheath was introduced into the left radial artery. 3 mg of verapamil was administered through the sheath, weight-based unfractionated heparin was administered intravenously. Standard Judkins catheters were used for selective coronary angiography and left ventriculography. A JR 4 catheter was used for LIMA angiography. SVG angiography was performed with an AL-3 catheter but the grafts were difficult to reach. Aortic root angiography was performed. Catheter exchanges were performed over an exchange length guidewire. There were no immediate procedural complications. A TR band was used for radial hemostasis at the completion of the procedure. The patient was transferred to the post catheterization recovery area for further monitoring.  Procedural Findings: Hemodynamics: AO 139/71 LV 140/19  Coronary angiography: Coronary dominance: right  Left mainstem: Patent vessel, no significant stenosis  Left anterior  descending (LAD): 80% proximal stenosis. The diagonals and septals are patent. The mid-LAD fills from the LIMA  Left circumflex (LCx): Total occlusion at the ostium. Ostial ramus stent also occluded.  LIMA-LAD: widely patent without stenosis. Fills the mid/distal LAD and supplies a collateral to the intermediate branch  All SVG's are occluded at the aorta.  Right coronary artery (RCA): Large dominant vessel, heavily calcified. There is diffuse 40% in-stent restenosis through the mid vessel. The distal vessel has 50% stenosis before the bifurcation of the PDA and PLA. The branch vessels of the RCA are widely patnet and there is a well formed collateral to the left circumflex.  Left ventriculography: Left ventricular systolic function is normal, LVEF is estimated at 55%, there is no significant mitral regurgitation   Aortic root angio: the aortic sinuses are dilated and there is moderate AI. The ascending aorta also appears dilated.  Estimated Blood Loss: minimal  Final Conclusions:  1. 3 vessel CAD with patency of the RCA stents and mild to moderate obstruction, total occlusion of the LCx, and severe stenosis of the proximal LAD 2. S/P multivessel CABG with continued patency of the LIMA-LAD and total occlusion of the SVG's 3. Extensive collateralization of the LCx from  the RCA and LIMA-LAD 4. Preserved LV function 5. Dilated aortic sinuses with moderate AI  Recommendations: Medical therapy. I don't see any feasible way to percutaneously revascularize the left circumflex territory  [name] MD, [facility]  Assessment and Plan:  1. Multivessel CAD status post CABG with graft disease as outlined. Plan is to continue medical therapy at this time. I have encouraged him to maintain a exercise regimen as tolerated, continue to work on blood pressure control, and otherwise risk factor modification with cholesterol management.  2. Essential hypertension, blood pressure control looks fairly good  today. We did not make any changes, but he will be checking blood pressure at home and recording the results.  3. History of PSVT, quiescent at present.  Current medicines are reviewed at length with the patient today.  The patient does not have concerns regarding medicines.  Disposition: FU with me in 6 months.   Signed, Satira Sark, MD, [facility] 07/07/2014 8:58 AM    [facility] [place] [address], [place], [place] [address] Phone: [phone]; Fax: [phone]

## 2014-07-07 NOTE — Patient Instructions (Signed)

## 2014-08-24 ENCOUNTER — Other Ambulatory Visit: Payer: Self-pay | Admitting: Cardiology

## 2014-10-01 ENCOUNTER — Other Ambulatory Visit: Payer: Self-pay | Admitting: Cardiology

## 2014-10-13 ENCOUNTER — Other Ambulatory Visit: Payer: Self-pay | Admitting: Cardiovascular Disease

## 2014-10-13 MED ORDER — METOPROLOL SUCCINATE ER 50 MG PO TB24: 50.0000 mg | ORAL_TABLET | Freq: Every day | ORAL | Status: DC

## 2014-10-13 NOTE — Telephone Encounter (Signed)
Needs refill on metoprolol succinate (TOPROL-XL) 50 MG 24 hr tablet  [facility]

## 2015-03-30 ENCOUNTER — Other Ambulatory Visit: Payer: Self-pay | Admitting: Cardiovascular Disease

## 2015-04-27 ENCOUNTER — Ambulatory Visit (INDEPENDENT_AMBULATORY_CARE_PROVIDER_SITE_OTHER): Payer: Medicare Other | Admitting: Cardiology

## 2015-04-27 ENCOUNTER — Encounter: Payer: Self-pay | Admitting: Cardiology

## 2015-04-27 VITALS — BP 159/84 | HR 60 | Ht 68.0 in | Wt 191.0 lb

## 2015-04-27 DIAGNOSIS — I471 Supraventricular tachycardia: Secondary | ICD-10-CM | POA: Diagnosis not present

## 2015-04-27 DIAGNOSIS — E782 Mixed hyperlipidemia: Secondary | ICD-10-CM | POA: Diagnosis not present

## 2015-04-27 DIAGNOSIS — I25709 Atherosclerosis of coronary artery bypass graft(s), unspecified, with unspecified angina pectoris: Secondary | ICD-10-CM | POA: Diagnosis not present

## 2015-04-27 NOTE — Progress Notes (Signed)
Cardiology Office Note  Date: 04/27/2015   ID: Eric Beltran, DOB [birth year], MRN [id number]  PCP: Alonza Bogus, MD  Primary Cardiologist: Rozann Lesches, MD   Chief Complaint  Patient presents with  . Coronary Artery Disease  . PSVT    History of Present Illness: Eric Beltran is a 74 y.o. male last seen in February. He presents for a routine follow-up visit. No complaints of progressing angina symptoms or significant nitroglycerin use. He does have episodes of PSVT which break with vagal maneuvers. Interestingly, he says that the best way for him to get these episodes to stop is for him to lay over his desk and put his head down.  We reviewed his medications. He continues on aspirin, Lipitor, Cardizem CD, Toprol-XL, and omega-3 supplements.  Recent lab work obtained by Dr. Luan Pulling was reviewed today, outlined below. Lipids have been overall well controlled in terms of LDL.  Cardiac catheterization done earlier this year demonstrated multivessel disease with patent RCA stent sites, occluded circumflex, and severely diseased proximal LAD. LIMA to LAD was patent, and 2 vein grafts were occluded. There was extensive collateralization of the circumflex from the RCA and LIMA to LAD. Medical therapy has been recommended.  Past Medical History  Diagnosis Date  . Mixed hyperlipidemia   . Essential hypertension, benign   . Coronary atherosclerosis of native coronary artery     Multivessel status post prior stenting and ultimately CABG 12/2011 at [facility]  . Supraventricular tachycardia ([place])   . Atrial fibrillation (HCC)     Perioperative  . Ascending aortic aneurysm [facility])     Status post repair 12/2011 at [facility]  . BPH (benign prostatic hyperplasia)     Current Outpatient Prescriptions  Medication Sig Dispense Refill  . acyclovir (ZOVIRAX) 800 MG tablet Take 400 mg by mouth daily as needed (outbreak).   11  . aspirin EC 81 MG tablet Take 81 mg by mouth at bedtime.     [name]  atorvastatin (LIPITOR) 40 MG tablet TAKE 1 TABLET BY MOUTH AT BEDTIME 90 tablet 3  . diltiazem (DILTIAZEM CD) 240 MG 24 hr capsule Take 1 capsule (240 mg total) by mouth daily. 30 capsule 3  . metoprolol succinate (TOPROL-XL) 50 MG 24 hr tablet Take 1 tablet (50 mg total) by mouth daily. 30 tablet 6  . Multiple Vitamin (MULTIVITAMIN) tablet Take 1 tablet by mouth at bedtime.     . naproxen (NAPROSYN) 500 MG tablet Take 500 mg by mouth 2 (two) times daily as needed (pain).    . nitroGLYCERIN (NITROSTAT) 0.4 MG SL tablet Place 1 tablet (0.4 mg total) under the tongue every 5 (five) minutes as needed for chest pain. 90 tablet 3  . Omega-3 Fatty Acids (FISH OIL) 1000 MG CAPS Take 1 capsule by mouth daily.      . tadalafil (CIALIS) 5 MG tablet Take 5 mg by mouth as needed for erectile dysfunction.    . Tamsulosin HCl (FLOMAX) 0.4 MG CAPS Take 0.4 mg by mouth daily.      No current facility-administered medications for this visit.   Allergies:  Review of patient's allergies indicates no known allergies.   Social History: The patient  reports that he has never smoked. He has never used smokeless tobacco. He reports that he drinks alcohol. He reports that he does not use illicit drugs.   ROS:  Please see the history of present illness. Otherwise, complete review of systems is positive for trouble with recurring back  pain, he is to have pain control injections soon.  All other systems are reviewed and negative.   Physical Exam: VS:  BP 159/84 mmHg  Pulse 60  Ht 5\' 8"  (1.727 m)  Wt 191 lb (86.637 kg)  BMI 29.05 kg/m2  SpO2 95%, BMI Body mass index is 29.05 kg/(m^2).  Wt Readings from Last 3 Encounters:  04/27/15 191 lb (86.637 kg)  07/07/14 191 lb (86.637 kg)  06/18/14 189 lb (85.73 kg)    General: Patient appears comfortable at rest. HEENT: Conjunctiva and lids normal, oropharynx clear with moist mucosa. Neck: Supple, no elevated JVP or carotid bruits, no thyromegaly. Lungs: Clear to  auscultation, nonlabored breathing at rest. Cardiac: Regular rate and rhythm, no S3 or significant systolic murmur, no pericardial rub. Abdomen: Soft, nontender, no hepatomegaly, bowel sounds present, no guarding or rebound. Extremities: No pitting edema, distal pulses 2+. Skin: Warm and dry. Musculoskeletal: No kyphosis. Neuropsychiatric: Alert and oriented x3, affect grossly appropriate.  ECG: ECG is not ordered today.  Recent Labwork:  November 2016: Hemoglobin 13.1, platelets 283, potassium 4.4, BUN 18, creatinine 1.1, AST 18, ALT 16, cholesterol 106, triglycerides 162, HDL 31, LDL 32, TSH 1.1  Assessment and Plan:  1. Multivessel CAD with graft disease as described above. He is symptomatic with stable on current medical regimen. We will continue observation. He would like to try to get back to a basic exercise regimen once his back pain has improved.  2. PSVT, intermittent breakthrough episodes noted. We will continue combination of calcium channel blocker and beta blocker. We did discuss possibility of referral to EP if symptoms worsen.  3. Hyperlipidemia, on statin therapy and omega-3 supplements. No changes made today.  Current medicines were reviewed with the patient today.  Disposition: FU with me in 6 months.   Signed, Satira Sark, MD, [facility] 04/27/2015 4:09 PM    [place] [facility] [place], [place], [place] [address] Phone: [phone]; Fax: [phone]

## 2015-04-27 NOTE — Patient Instructions (Signed)

## 2015-06-30 DIAGNOSIS — R972 Elevated prostate specific antigen [PSA]: Secondary | ICD-10-CM | POA: Diagnosis not present

## 2015-07-13 DIAGNOSIS — R972 Elevated prostate specific antigen [PSA]: Secondary | ICD-10-CM | POA: Diagnosis not present

## 2015-07-18 ENCOUNTER — Telehealth: Payer: Self-pay | Admitting: Orthopaedic Surgery

## 2015-07-19 NOTE — Telephone Encounter (Signed)
Rx done. 

## 2015-07-27 DIAGNOSIS — M7061 Trochanteric bursitis, right hip: Secondary | ICD-10-CM | POA: Diagnosis not present

## 2015-07-27 DIAGNOSIS — M4727 Other spondylosis with radiculopathy, lumbosacral region: Secondary | ICD-10-CM | POA: Diagnosis not present

## 2015-07-27 DIAGNOSIS — M5126 Other intervertebral disc displacement, lumbar region: Secondary | ICD-10-CM | POA: Diagnosis not present

## 2015-07-27 DIAGNOSIS — M4316 Spondylolisthesis, lumbar region: Secondary | ICD-10-CM | POA: Diagnosis not present

## 2015-08-12 ENCOUNTER — Other Ambulatory Visit: Payer: Self-pay | Admitting: Cardiology

## 2015-08-18 DIAGNOSIS — L820 Inflamed seborrheic keratosis: Secondary | ICD-10-CM | POA: Diagnosis not present

## 2015-08-18 DIAGNOSIS — B001 Herpesviral vesicular dermatitis: Secondary | ICD-10-CM | POA: Diagnosis not present

## 2015-08-25 DIAGNOSIS — I252 Old myocardial infarction: Secondary | ICD-10-CM | POA: Diagnosis not present

## 2015-08-25 DIAGNOSIS — Z951 Presence of aortocoronary bypass graft: Secondary | ICD-10-CM | POA: Diagnosis not present

## 2015-08-25 DIAGNOSIS — M4726 Other spondylosis with radiculopathy, lumbar region: Secondary | ICD-10-CM | POA: Diagnosis not present

## 2015-08-25 DIAGNOSIS — M5126 Other intervertebral disc displacement, lumbar region: Secondary | ICD-10-CM | POA: Diagnosis not present

## 2015-08-25 DIAGNOSIS — M5116 Intervertebral disc disorders with radiculopathy, lumbar region: Secondary | ICD-10-CM | POA: Diagnosis not present

## 2015-08-25 DIAGNOSIS — M4306 Spondylolysis, lumbar region: Secondary | ICD-10-CM | POA: Diagnosis not present

## 2015-08-25 DIAGNOSIS — Z79899 Other long term (current) drug therapy: Secondary | ICD-10-CM | POA: Diagnosis not present

## 2015-08-25 DIAGNOSIS — I10 Essential (primary) hypertension: Secondary | ICD-10-CM | POA: Diagnosis not present

## 2015-08-25 DIAGNOSIS — M5136 Other intervertebral disc degeneration, lumbar region: Secondary | ICD-10-CM | POA: Diagnosis not present

## 2015-09-22 DIAGNOSIS — M4806 Spinal stenosis, lumbar region: Secondary | ICD-10-CM | POA: Diagnosis not present

## 2015-09-22 DIAGNOSIS — M47816 Spondylosis without myelopathy or radiculopathy, lumbar region: Secondary | ICD-10-CM | POA: Diagnosis not present

## 2015-09-22 DIAGNOSIS — M5136 Other intervertebral disc degeneration, lumbar region: Secondary | ICD-10-CM | POA: Diagnosis not present

## 2015-09-22 DIAGNOSIS — M5126 Other intervertebral disc displacement, lumbar region: Secondary | ICD-10-CM | POA: Diagnosis not present

## 2015-10-11 ENCOUNTER — Telehealth: Payer: Self-pay | Admitting: Orthopaedic Surgery

## 2015-10-11 ENCOUNTER — Other Ambulatory Visit: Payer: Self-pay | Admitting: Cardiology

## 2015-10-11 NOTE — Telephone Encounter (Signed)
Rx done. 

## 2015-10-18 ENCOUNTER — Ambulatory Visit (INDEPENDENT_AMBULATORY_CARE_PROVIDER_SITE_OTHER): Payer: Medicare Other

## 2015-10-18 ENCOUNTER — Ambulatory Visit (INDEPENDENT_AMBULATORY_CARE_PROVIDER_SITE_OTHER): Payer: Medicare Other | Admitting: Orthopaedic Surgery

## 2015-10-18 ENCOUNTER — Encounter: Payer: Self-pay | Admitting: Orthopaedic Surgery

## 2015-10-18 VITALS — BP 121/58 | HR 64 | Temp 98.1°F | Ht 65.25 in | Wt 195.0 lb

## 2015-10-18 DIAGNOSIS — M5441 Lumbago with sciatica, right side: Secondary | ICD-10-CM | POA: Diagnosis not present

## 2015-10-18 DIAGNOSIS — M25551 Pain in right hip: Secondary | ICD-10-CM | POA: Diagnosis not present

## 2015-10-18 DIAGNOSIS — I10 Essential (primary) hypertension: Secondary | ICD-10-CM

## 2015-10-18 NOTE — Patient Instructions (Signed)
Return apt to see Dr. Aline Brochure to discuss THA surgery Right hip.  Precautions discussed.  Continue current medications.

## 2015-10-18 NOTE — Progress Notes (Signed)
Patient Eric Beltran, male DOB:10-Feb-1941, 75 y.o. [id number]  Chief Complaint  Patient presents with  . New Patient (Initial Visit)    Right hip pain    HPI  Eric Beltran is a 75 y.o. male who has worsening of his right hip pain.  I saw him two years ago and he had degenerative changes of the hip.  It has gotten more painful and has less motion now.  I have had him on Naprosyn which helps.  He has no new trauma.  He has lower back pain with deteriorated discs.  He has seen Dr. Carloyn Manner the neurosurgeon and Dr. Francesco Runner for epidural injections.  The injections did not help.  He has been told he might need surgery but to wait now.  He has hypertension, well controlled.  He watches his diet.  He has coronary heart disease as well, no new problems.   HPI  Body mass index is 32.21 kg/(m^2).  ROS  Review of Systems  HENT: Negative for congestion.   Respiratory: Negative for cough and shortness of breath.   Cardiovascular: Negative for chest pain and leg swelling.  Endocrine: Negative for cold intolerance.  Musculoskeletal: Positive for arthralgias and gait problem.  Allergic/Immunologic: Negative for environmental allergies.    Past Medical History  Diagnosis Date  . Mixed hyperlipidemia   . Essential hypertension, benign   . Coronary atherosclerosis of native coronary artery     Multivessel status post prior stenting and ultimately CABG 12/2011 at [facility]  . Supraventricular tachycardia ([place])   . Atrial fibrillation (HCC)     Perioperative  . Ascending aortic aneurysm [facility])     Status post repair 12/2011 at [facility]  . BPH (benign prostatic hyperplasia)     Past Surgical History  Procedure Laterality Date  . Ascending aortic aneurysm repair  12/2011 [facility]    26 mm.Dacron graft  . Hernia repair    . Transurethral resection of prostate    . Coronary artery bypass graft  12/2011 [facility]    LIMA to LAD, SVG to PLB, SVG to ramus, SVG to OM  . Left heart catheterization with  coronary angiogram N/A 06/18/2014    Procedure: LEFT HEART CATHETERIZATION WITH CORONARY ANGIOGRAM;  Surgeon: Blane Ohara, MD;  Location: [facility] CATH LAB;  Service: Cardiovascular;  Laterality: N/A;    History reviewed. No pertinent family history.  Social History Social History  Substance Use Topics  . Smoking status: Never Smoker   . Smokeless tobacco: Never Used     Comment: tobacco use - no  . Alcohol Use: 0.0 oz/week    0 Standard drinks or equivalent per week     Comment: Occasional    No Known Allergies  Current Outpatient Prescriptions  Medication Sig Dispense Refill  . acyclovir (ZOVIRAX) 800 MG tablet Take 400 mg by mouth daily as needed (outbreak).   11  . aspirin EC 81 MG tablet Take 81 mg by mouth at bedtime.     [name] atorvastatin (LIPITOR) 40 MG tablet TAKE 1 TABLET BY MOUTH AT BEDTIME 90 tablet 3  . diltiazem (CARDIZEM CD) 240 MG 24 hr capsule TAKE 1 CAPSULE BY MOUTH EVERY DAY 30 capsule 3  . gabapentin (NEURONTIN) 300 MG capsule TAKE 1 CAPSULE BY MOUTH THREE TIMES DAILY AS DIRECTED 90 capsule 2  . metoprolol succinate (TOPROL-XL) 50 MG 24 hr tablet Take 1 tablet (50 mg total) by mouth daily. 30 tablet 6  . Multiple Vitamin (MULTIVITAMIN) tablet Take 1 tablet by  mouth at bedtime.     . naproxen (NAPROSYN) 500 MG tablet Take 500 mg by mouth 2 (two) times daily as needed (pain).    . nitroGLYCERIN (NITROSTAT) 0.4 MG SL tablet Place 1 tablet (0.4 mg total) under the tongue every 5 (five) minutes as needed for chest pain. 90 tablet 3  . Omega-3 Fatty Acids (FISH OIL) 1000 MG CAPS Take 1 capsule by mouth daily.      . tadalafil (CIALIS) 5 MG tablet Take 5 mg by mouth as needed for erectile dysfunction.    . Tamsulosin HCl (FLOMAX) 0.4 MG CAPS Take 0.4 mg by mouth daily.      No current facility-administered medications for this visit.     Physical Exam  Blood pressure 121/58, pulse 64, temperature 98.1 F (36.7 C), height 5' 5.25" (1.657 m), weight 195 lb (88.451  kg).  Constitutional: overall normal hygiene, normal nutrition, well developed, normal grooming, normal body habitus. Assistive device:none  Musculoskeletal: gait and station Limp right, muscle tone and strength are normal, no tremors or atrophy is present.  .  Neurological: coordination overall normal.  Deep tendon reflex/nerve stretch intact.  Sensation normal.  Cranial nerves II-XII intact.   Skin:   normal overall no scars, lesions, ulcers or rashes. No psoriasis.  Psychiatric: Alert and oriented x 3.  Recent memory intact, remote memory unclear.  Normal mood and affect. Well groomed.  Good eye contact.  Cardiovascular: overall no swelling, no varicosities, no edema bilaterally, normal temperatures of the legs and arms, no clubbing, cyanosis and good capillary refill.  Lymphatic: palpation is normal.  The right hip has limited internal rotation of 5, external rotation of 15, flexion of 90, extension of 10, abduction of 20, adduction of 20.  NV is intact.  He has a limp to the right.  Left hip is not tender with good motion.  X-rays were done of the right hip, reported separately.  The patient has been educated about the nature of the problem(s) and counseled on treatment options.  The patient appeared to understand what I have discussed and is in agreement with it.  Encounter Diagnoses  Name Primary?  . Right hip pain Yes  . Right-sided low back pain with right-sided sciatica   . Essential hypertension, benign     PLAN Call if any problems.  Precautions discussed.  Continue current medications.   Return to clinic See Dr. Aline Brochure for a total hip evaluation.  I have recommended this based on x-rays and worsening of his pain and motion.   Electronically Signed Sanjuana Kava, MD 6/6/20172:50 PM

## 2015-10-26 ENCOUNTER — Encounter: Payer: Self-pay | Admitting: Cardiology

## 2015-10-26 ENCOUNTER — Ambulatory Visit (INDEPENDENT_AMBULATORY_CARE_PROVIDER_SITE_OTHER): Payer: Medicare Other | Admitting: Cardiology

## 2015-10-26 VITALS — BP 154/78 | HR 59 | Ht 67.0 in | Wt 194.0 lb

## 2015-10-26 DIAGNOSIS — E782 Mixed hyperlipidemia: Secondary | ICD-10-CM

## 2015-10-26 DIAGNOSIS — I471 Supraventricular tachycardia: Secondary | ICD-10-CM | POA: Diagnosis not present

## 2015-10-26 DIAGNOSIS — I25709 Atherosclerosis of coronary artery bypass graft(s), unspecified, with unspecified angina pectoris: Secondary | ICD-10-CM

## 2015-10-26 DIAGNOSIS — Z01810 Encounter for preprocedural cardiovascular examination: Secondary | ICD-10-CM

## 2015-10-26 DIAGNOSIS — I10 Essential (primary) hypertension: Secondary | ICD-10-CM | POA: Diagnosis not present

## 2015-10-26 NOTE — Patient Instructions (Signed)
Your physician recommends that you continue on your current medications as directed. Please refer to the Current Medication list given to you today. Your physician recommends that you schedule a follow-up appointment in: 6 month. You will receive a reminder letter in the mail in about 4 months reminding you to call and schedule your appointment. If you don't receive this letter, please contact our office. 

## 2015-10-26 NOTE — Progress Notes (Signed)
Cardiology Office Note  Date: 10/26/2015   ID: ARZA KOETJE, DOB November 13, 1940, MRN [id number]  PCP: Alonza Bogus, MD  Primary Cardiologist: Rozann Lesches, MD   Chief Complaint  Patient presents with  . Coronary Artery Disease    History of Present Illness: Eric Beltran is a 75 y.o. male last seen in December 2016. He presents for a routine follow-up visit. No significant angina symptoms on current medical regimen. He has had only a few episodes of palpitations of been limited. No dizziness or syncope.  Cardiac catheterization done earlier this year demonstrated multivessel disease with patent RCA stent sites, occluded circumflex, and severely diseased proximal LAD. LIMA to LAD was patent, and 2 vein grafts were occluded. There was extensive collateralization of the circumflex from the RCA and LIMA to LAD. Medical therapy has been recommended.  I reviewed his ECG today which shows sinus bradycardia with prolonged PR interval and old inferior infarct pattern.  He reports progressive trouble with right hip pain, is undergoing orthopedic evaluation and may ultimately need a hip replacement. Possibly within the next 6 months.  Past Medical History  Diagnosis Date  . Mixed hyperlipidemia   . Essential hypertension, benign   . Coronary atherosclerosis of native coronary artery     Multivessel status post prior stenting and ultimately CABG 12/2011 at [facility]  . Supraventricular tachycardia ([place])   . Atrial fibrillation (HCC)     Perioperative  . Ascending aortic aneurysm [facility])     Status post repair 12/2011 at [facility]  . BPH (benign prostatic hyperplasia)     Past Surgical History  Procedure Laterality Date  . Ascending aortic aneurysm repair  12/2011 [facility]    26 mm.Dacron graft  . Hernia repair    . Transurethral resection of prostate    . Coronary artery bypass graft  12/2011 [facility]    LIMA to LAD, SVG to PLB, SVG to ramus, SVG to OM  . Left heart catheterization with  coronary angiogram N/A 06/18/2014    Procedure: LEFT HEART CATHETERIZATION WITH CORONARY ANGIOGRAM;  Surgeon: Blane Ohara, MD;  Location: [facility] CATH LAB;  Service: Cardiovascular;  Laterality: N/A;    Current Outpatient Prescriptions  Medication Sig Dispense Refill  . acyclovir (ZOVIRAX) 800 MG tablet Take 400 mg by mouth daily as needed (outbreak).   11  . aspirin EC 81 MG tablet Take 81 mg by mouth at bedtime.     [name] atorvastatin (LIPITOR) 40 MG tablet TAKE 1 TABLET BY MOUTH AT BEDTIME 90 tablet 3  . diltiazem (CARDIZEM CD) 240 MG 24 hr capsule TAKE 1 CAPSULE BY MOUTH EVERY DAY 30 capsule 3  . gabapentin (NEURONTIN) 300 MG capsule TAKE 1 CAPSULE BY MOUTH THREE TIMES DAILY AS DIRECTED 90 capsule 2  . metoprolol succinate (TOPROL-XL) 50 MG 24 hr tablet Take 1 tablet (50 mg total) by mouth daily. 30 tablet 6  . Multiple Vitamin (MULTIVITAMIN) tablet Take 1 tablet by mouth at bedtime.     . naproxen (NAPROSYN) 500 MG tablet Take 500 mg by mouth 2 (two) times daily as needed (pain).    . nitroGLYCERIN (NITROSTAT) 0.4 MG SL tablet Place 1 tablet (0.4 mg total) under the tongue every 5 (five) minutes as needed for chest pain. 90 tablet 3  . Omega-3 Fatty Acids (FISH OIL) 1000 MG CAPS Take 1 capsule by mouth daily.      . tadalafil (CIALIS) 5 MG tablet Take 5 mg by mouth as needed for erectile dysfunction.    [name]  Tamsulosin HCl (FLOMAX) 0.4 MG CAPS Take 0.4 mg by mouth daily.      No current facility-administered medications for this visit.   Allergies:  Review of patient's allergies indicates no known allergies.   Social History: The patient  reports that he has never smoked. He has never used smokeless tobacco. He reports that he drinks alcohol. He reports that he does not use illicit drugs.   ROS:  Please see the history of present illness. Otherwise, complete review of systems is positive for arthritic pains, dependent leg edema..  All other systems are reviewed and negative.   Physical  Exam: VS:  BP 154/78 mmHg  Pulse 59  Ht 5\' 7"  (1.702 m)  Wt 194 lb (87.998 kg)  BMI 30.38 kg/m2  SpO2 94%, BMI Body mass index is 30.38 kg/(m^2).  Wt Readings from Last 3 Encounters:  10/26/15 194 lb (87.998 kg)  10/18/15 195 lb (88.451 kg)  04/27/15 191 lb (86.637 kg)    General: Patient appears comfortable at rest. HEENT: Conjunctiva and lids normal, oropharynx clear with moist mucosa. Neck: Supple, no elevated JVP or carotid bruits, no thyromegaly. Lungs: Clear to auscultation, nonlabored breathing at rest. Cardiac: Regular rate and rhythm, no S3 or significant systolic murmur, no pericardial rub. Abdomen: Soft, nontender, no hepatomegaly, bowel sounds present, no guarding or rebound. Extremities: No pitting edema, distal pulses 2+. Skin: Warm and dry. Musculoskeletal: No kyphosis. Neuropsychiatric: Alert and oriented x3, affect grossly appropriate.  ECG: I personally reviewed the prior tracing from 05/21/2014 which showed sinus rhythm with prolonged PR interval and old inferior infarct pattern.  Recent Labwork:  November 2016: Hemoglobin 13.1, platelets 283, potassium 4.4, BUN 18, creatinine 1.2, AST 18, ALT 16, cholesterol 106, triglycerides 162, HDL 31, LDL 43, TSH 1.15  Other Studies Reviewed Today:  Cardiac catheterization 06/18/2014: Left mainstem: Patent vessel, no significant stenosis  Left anterior descending (LAD): 80% proximal stenosis. The diagonals and septals are patent. The mid-LAD fills from the LIMA  Left circumflex (LCx): Total occlusion at the ostium. Ostial ramus stent also occluded.  LIMA-LAD: widely patent without stenosis. Fills the mid/distal LAD and supplies a collateral to the intermediate branch  All SVG's are occluded at the aorta.  Right coronary artery (RCA): Large dominant vessel, heavily calcified. There is diffuse 40% in-stent restenosis through the mid vessel. The distal vessel has 50% stenosis before the bifurcation of the PDA and PLA. The  branch vessels of the RCA are widely patnet and there is a well formed collateral to the left circumflex.  Left ventriculography: Left ventricular systolic function is normal, LVEF is estimated at 55%, there is no significant mitral regurgitation   Aortic root angio: the aortic sinuses are dilated and there is moderate AI. The ascending aorta also appears dilated.  Estimated Blood Loss: minimal  Final Conclusions:  1. 3 vessel CAD with patency of the RCA stents and mild to moderate obstruction, total occlusion of the LCx, and severe stenosis of the proximal LAD 2. S/P multivessel CABG with continued patency of the LIMA-LAD and total occlusion of the SVG's 3. Extensive collateralization of the LCx from the RCA and LIMA-LAD 4. Preserved LV function 5. Dilated aortic sinuses with moderate AI  Echocardiogram 05/27/2014: Study Conclusions  - Left ventricle: The cavity size was normal. Wall thickness was increased in a pattern of severe LVH. Systolic function was normal. The estimated ejection fraction was in the range of 60% to 65%. Doppler parameters are consistent with abnormal left ventricular relaxation (grade  1 diastolic dysfunction). - Aortic valve: Moderately calcified annulus. Trileaflet; mildly thickened leaflets. There was mild regurgitation. Valve area (VTI): 2.99 cm^2. Valve area (Vmax): 2.07 cm^2. - Aorta: Aortic root dimension: 41 mm (ED). - Aortic root: The aortic root was mildly dilated. - Mitral valve: Mildly calcified annulus. Mildly thickened leaflets. - Left atrium: The atrium was severely dilated. - Technically difficult study.  Assessment and Plan:  1. Multivessel CAD with graft disease as outlined above. He is clinically stable at this time without angina on medical therapy. ECG reviewed and also stable. No changes were made today.  2. Preoperative evaluation. May be in need of right hip replacement, full orthopedic evaluation pending. I would not  adjust any further cardiac testing at this time on visit of his clinical stability.  3. Essential hypertension. Blood pressure is elevated today. He is on Cardizem CD and Toprol-XL mainly for treatment of PSVT. Would keep follow-up with Dr. Luan Pulling, physical due in November. We did discuss diet and salt restriction.  4. Hyperlipidemia, on Lipitor. Last LDL was 43.  5. PSVT, currently well controlled on present regimen. Only infrequent palpitations.  Current medicines were reviewed with the patient today.   Orders Placed This Encounter  Procedures  . EKG 12-Lead   Patient Instructions  Your physician recommends that you continue on your current medications as directed. Please refer to the Current Medication list given to you today. Your physician recommends that you schedule a follow-up appointment in: 6 month. You will receive a reminder letter in the mail in about 4 months reminding you to call and schedule your appointment. If you don't receive this letter, please contact our office.    Signed, Satira Sark, MD, [facility] 10/26/2015 3:30 PM    [place] [facility] [place], [place], [place] [address] Phone: [phone]; Fax: [phone]

## 2015-10-31 ENCOUNTER — Ambulatory Visit (INDEPENDENT_AMBULATORY_CARE_PROVIDER_SITE_OTHER): Payer: Medicare Other | Admitting: Orthopedic Surgery

## 2015-10-31 VITALS — BP 144/69 | HR 51 | Ht 67.0 in | Wt 191.8 lb

## 2015-10-31 DIAGNOSIS — M1611 Unilateral primary osteoarthritis, right hip: Secondary | ICD-10-CM | POA: Diagnosis not present

## 2015-10-31 NOTE — Progress Notes (Signed)
Patient ID: Eric Beltran, male   DOB: 10-Jun-1940, 75 y.o.   MRN: [id number]  Chief Complaint  Patient presents with  . Hip Pain    Right hip    HPI 75 year old male with a history of moderate to severe nonradiating pain in his right groin and right thigh presents for evaluation of right total hip. He was seen by Dr. Luna Glasgow treated nonoperatively but symptoms have become worse.  Review of Systems  Constitutional: Negative for fever and chills.  Respiratory: Negative for shortness of breath.   Cardiovascular: Negative for chest pain.      Past Medical History  Diagnosis Date  . Mixed hyperlipidemia   . Essential hypertension, benign   . Coronary atherosclerosis of native coronary artery     Multivessel status post prior stenting and ultimately CABG 12/2011 at [facility]  . Supraventricular tachycardia ([place])   . Atrial fibrillation (HCC)     Perioperative  . Ascending aortic aneurysm [facility])     Status post repair 12/2011 at [facility]  . BPH (benign prostatic hyperplasia)     BP 144/69 mmHg  Pulse 51  Ht 5\' 7"  (1.702 m)  Wt 191 lb 12.8 oz (87 kg)  BMI 30.03 kg/m2  Physical Exam Physical Exam  Constitutional: The patient appears well-developed and well-nourished. No distress.  The patient is oriented to person, place, and time.  Psychiatric: The patient has a normal mood and affect.  Cardiovascular: Intact distal pulses.   Neurological: sensation is normal  Skin: Skin is warm and dry. No rash noted. The patient is not diaphoretic. No erythema. No pallor.   Gait is normal Right Hip Exam   Tenderness  The patient is experiencing no tenderness.     Range of Motion  Flexion: 120   Muscle Strength  Flexion: 5/5   Other  Erythema: absent Scars: absent Sensation: normal Pulse: present  Comments:  Hips stable   Left Hip Exam   Tenderness  The patient is experiencing no tenderness.     Range of Motion  Flexion:  130 normal   Muscle Strength  Flexion: 5/5    Other  Erythema: absent Scars: absent Sensation: normal Pulse: present  Comments:  Hips stable        ASSESSMENT AND PLAN   Osteoarthritis right hip  Patient interested in primary total hip from an anterior approach  Refer to Dr. Florentina Addison, MD 10/31/2015 10:36 AM

## 2015-11-02 DIAGNOSIS — Z79899 Other long term (current) drug therapy: Secondary | ICD-10-CM | POA: Diagnosis not present

## 2015-11-02 DIAGNOSIS — N3289 Other specified disorders of bladder: Secondary | ICD-10-CM | POA: Diagnosis not present

## 2015-11-02 DIAGNOSIS — R399 Unspecified symptoms and signs involving the genitourinary system: Secondary | ICD-10-CM | POA: Diagnosis not present

## 2015-11-02 DIAGNOSIS — Z7982 Long term (current) use of aspirin: Secondary | ICD-10-CM | POA: Diagnosis not present

## 2015-11-02 DIAGNOSIS — R39198 Other difficulties with micturition: Secondary | ICD-10-CM | POA: Diagnosis not present

## 2015-11-02 DIAGNOSIS — N400 Enlarged prostate without lower urinary tract symptoms: Secondary | ICD-10-CM | POA: Diagnosis not present

## 2015-11-02 DIAGNOSIS — Z96641 Presence of right artificial hip joint: Secondary | ICD-10-CM | POA: Diagnosis not present

## 2015-11-02 DIAGNOSIS — N2889 Other specified disorders of kidney and ureter: Secondary | ICD-10-CM | POA: Diagnosis not present

## 2015-11-23 DIAGNOSIS — M25551 Pain in right hip: Secondary | ICD-10-CM | POA: Diagnosis not present

## 2015-11-23 DIAGNOSIS — M1611 Unilateral primary osteoarthritis, right hip: Secondary | ICD-10-CM | POA: Diagnosis not present

## 2015-12-12 ENCOUNTER — Telehealth: Payer: Self-pay | Admitting: Orthopaedic Surgery

## 2015-12-12 ENCOUNTER — Other Ambulatory Visit: Payer: Self-pay | Admitting: Cardiology

## 2015-12-12 DIAGNOSIS — M1611 Unilateral primary osteoarthritis, right hip: Secondary | ICD-10-CM | POA: Diagnosis not present

## 2015-12-12 DIAGNOSIS — M85851 Other specified disorders of bone density and structure, right thigh: Secondary | ICD-10-CM | POA: Diagnosis not present

## 2016-01-19 DIAGNOSIS — R310 Gross hematuria: Secondary | ICD-10-CM | POA: Diagnosis not present

## 2016-01-19 DIAGNOSIS — R972 Elevated prostate specific antigen [PSA]: Secondary | ICD-10-CM | POA: Diagnosis not present

## 2016-01-23 ENCOUNTER — Other Ambulatory Visit: Payer: Self-pay | Admitting: Physician Assistant

## 2016-01-26 ENCOUNTER — Encounter (HOSPITAL_COMMUNITY)
Admission: RE | Admit: 2016-01-26 | Discharge: 2016-01-26 | Disposition: A | Discharge status: Home / Self Care | Payer: Medicare Other | Source: Ambulatory Visit | Attending: Orthopaedic Surgery | Admitting: Orthopaedic Surgery

## 2016-01-26 ENCOUNTER — Encounter (HOSPITAL_COMMUNITY): Payer: Self-pay

## 2016-01-26 DIAGNOSIS — Z01818 Encounter for other preprocedural examination: Secondary | ICD-10-CM | POA: Insufficient documentation

## 2016-01-26 DIAGNOSIS — Z01812 Encounter for preprocedural laboratory examination: Secondary | ICD-10-CM | POA: Diagnosis not present

## 2016-01-26 DIAGNOSIS — M1611 Unilateral primary osteoarthritis, right hip: Secondary | ICD-10-CM | POA: Diagnosis not present

## 2016-01-26 HISTORY — DX: Acute myocardial infarction, unspecified: I21.9

## 2016-01-26 LAB — CBC
HCT: 45.4 % (ref 39.0–52.0)
Hemoglobin: 15 g/dL (ref 13.0–17.0)
MCH: 28.7 pg (ref 26.0–34.0)
MCHC: 33 g/dL (ref 30.0–36.0)
MCV: 86.8 fL (ref 78.0–100.0)
PLATELETS: 221 10*3/uL (ref 150–400)
RBC: 5.23 MIL/uL (ref 4.22–5.81)
RDW: 14.4 % (ref 11.5–15.5)
WBC: 9.3 10*3/uL (ref 4.0–10.5)

## 2016-01-26 LAB — BASIC METABOLIC PANEL
Anion gap: 8 (ref 5–15)
BUN: 14 mg/dL (ref 6–20)
CO2: 23 mmol/L (ref 22–32)
CREATININE: 0.87 mg/dL (ref 0.61–1.24)
Calcium: 9.1 mg/dL (ref 8.9–10.3)
Chloride: 109 mmol/L (ref 101–111)
GFR calc Af Amer: >60 mL/min (ref >60)
GFR calc non Af Amer: >60 mL/min (ref >60)
GLUCOSE: 106 mg/dL — AB (ref 65–99)
POTASSIUM: 3.7 mmol/L (ref 3.5–5.1)
Sodium: 140 mmol/L (ref 135–145)

## 2016-01-26 LAB — SURGICAL PCR SCREEN
MRSA, PCR: NEGATIVE
STAPHYLOCOCCUS AUREUS: NEGATIVE

## 2016-01-26 NOTE — Pre-Procedure Instructions (Addendum)
    MCGREGOR MOA  01/26/2016      [facility] - [place], [place] - [address] [place] [address] Phone: [phone] Fax: [phone]    Your procedure is scheduled on Tues, Sept. 26  Report to [facility] at 10:15 A.M.  Call this number if you have problems the morning of surgery:  [phone]   Remember:  Do not eat food or drink liquids after midnight.   Take these medicines the morning of surgery with A SIP OF WATER :  NITROGLYCERINE IF NEEDED             Stop 1 week prior to surgery:  NSAIDS: advil, motrin, ibuprofen, naproxen, goody's, BC Powders, aspirin, fish oil, vitamins/herbal medicines    Do not wear jewelry.  Do not wear lotions, powders, or cologne, or deoderant.  Do not shave 48 hours prior to surgery.  Men may shave face and neck.  Do not bring valuables to the hospital.  [facility] is not responsible for any belongings or valuables.  Contacts, dentures or bridgework may not be worn into surgery.  Leave your suitcase in the car.  After surgery it may be brought to your room.  For patients admitted to the hospital, discharge time will be determined by your treatment team.  Patients discharged the day of surgery will not be allowed to drive home.   Special instructions:  Review preparing for surgery  Please read over the following fact sheets that you were given. Coughing and Deep Breathing and MRSA Information

## 2016-01-26 NOTE — Progress Notes (Signed)
PCP: Dr. Sinda Du Cardiology: DR. Rozann Lesches

## 2016-01-27 NOTE — Progress Notes (Signed)
Anesthesia Chart Review:  Pt is a 75 year old male scheduled for R total hip arthroplasty anterior approach on 02/07/2016 with Jean Rosenthal, MD.   - PCP is Sinda Du, MD.  - Cardiologist is Rozann Lesches, MD; note 10/26/15 indicate Dr. Domenic Polite aware pt may undergo hip replacement and did not recommend any further cardiac testing prior to surgery  PMH includes:  CAD (S/p CABG 2013, 2 SVG occluded on cath 2016; hx prior stenting ro RCA), atrial fibrillation (post-op with CABG), ascending aortic aneurysm (s/p Dacron graft repair 2013 at time of CABG), SVT, HTN, hyperlipidemia. Never smoker. BMI 30  Medications include: ASA, lipitor, diltiazem, metoprolol, cialis  Preoperative labs reviewed.    EKG 10/26/15: Sinus  Bradycardia (59 bpm). First degree A-V block. PRi = 222. Low voltage in precordial leads. Old inferior infarct.   Cardiac cath 06/18/14:  1. 3 vessel CAD with patency of the RCA stents and mild to moderate obstruction, total occlusion of the LCx, and severe stenosis of the proximal LAD 2. S/P multivessel CABG with continued patency of the LIMA-LAD and total occlusion of the SVG's 3. Extensive collateralization of the LCx from the RCA and LIMA-LAD 4. Preserved LV function 5. Dilated aortic sinuses with moderate AI  Echo 05/27/14:  - Left ventricle: The cavity size was normal. Wall thickness was increased in a pattern of severe LVH. Systolic function was normal. The estimated ejection fraction was in the range of 60% to 65%. Doppler parameters are consistent with abnormal left ventricular relaxation (grade 1 diastolic dysfunction). - Aortic valve: Moderately calcified annulus. Trileaflet; mildly thickened leaflets. There was mild regurgitation. Valve area (VTI): 2.99 cm^2. Valve area (Vmax): 2.07 cm^2. - Aorta: Aortic root dimension: 41 mm (ED). - Aortic root: The aortic root was mildly dilated. - Mitral valve: Mildly calcified annulus. Mildly thickened leaflets. - Left  atrium: The atrium was severely dilated. - Technically difficult study.  If no changes, I anticipate pt can proceed with surgery as scheduled.   Willeen Cass, FNP-BC [facility]/Anesthesiology Phone: [phone] 01/27/2016 2:14 PM

## 2016-01-30 ENCOUNTER — Telehealth: Payer: Self-pay | Admitting: Orthopaedic Surgery

## 2016-01-30 DIAGNOSIS — R972 Elevated prostate specific antigen [PSA]: Secondary | ICD-10-CM | POA: Diagnosis not present

## 2016-01-30 DIAGNOSIS — N421 Congestion and hemorrhage of prostate: Secondary | ICD-10-CM | POA: Diagnosis not present

## 2016-01-30 DIAGNOSIS — N289 Disorder of kidney and ureter, unspecified: Secondary | ICD-10-CM | POA: Diagnosis not present

## 2016-02-06 MED ORDER — TRANEXAMIC ACID 1000 MG/10ML IV SOLN
1000.0000 mg | INTRAVENOUS | Status: AC
  Administered 2016-02-07: 1000 mg via INTRAVENOUS
  Filled 2016-02-06: qty 10

## 2016-02-07 ENCOUNTER — Encounter (HOSPITAL_COMMUNITY)
Admission: RE | Disposition: A | Discharge status: Home Health | Payer: Self-pay | Source: Ambulatory Visit | Attending: Orthopaedic Surgery

## 2016-02-07 ENCOUNTER — Encounter (HOSPITAL_COMMUNITY): Payer: Self-pay | Admitting: *Deleted

## 2016-02-07 ENCOUNTER — Inpatient Hospital Stay (HOSPITAL_COMMUNITY): Payer: Medicare Other | Admitting: Vascular Surgery

## 2016-02-07 ENCOUNTER — Inpatient Hospital Stay (HOSPITAL_COMMUNITY)
Admission: RE | Admit: 2016-02-07 | Discharge: 2016-02-10 | DRG: 470 | Disposition: A | Discharge status: Home Health | Payer: Medicare Other | Source: Ambulatory Visit | Attending: Orthopaedic Surgery | Admitting: Orthopaedic Surgery

## 2016-02-07 ENCOUNTER — Inpatient Hospital Stay (HOSPITAL_COMMUNITY): Payer: Medicare Other | Admitting: Anesthesiology

## 2016-02-07 ENCOUNTER — Inpatient Hospital Stay (HOSPITAL_COMMUNITY): Payer: Medicare Other

## 2016-02-07 DIAGNOSIS — M169 Osteoarthritis of hip, unspecified: Secondary | ICD-10-CM | POA: Diagnosis not present

## 2016-02-07 DIAGNOSIS — Z951 Presence of aortocoronary bypass graft: Secondary | ICD-10-CM

## 2016-02-07 DIAGNOSIS — Z96641 Presence of right artificial hip joint: Secondary | ICD-10-CM

## 2016-02-07 DIAGNOSIS — Z471 Aftercare following joint replacement surgery: Secondary | ICD-10-CM | POA: Diagnosis not present

## 2016-02-07 DIAGNOSIS — E782 Mixed hyperlipidemia: Secondary | ICD-10-CM | POA: Diagnosis not present

## 2016-02-07 DIAGNOSIS — Z419 Encounter for procedure for purposes other than remedying health state, unspecified: Secondary | ICD-10-CM

## 2016-02-07 DIAGNOSIS — I2510 Atherosclerotic heart disease of native coronary artery without angina pectoris: Secondary | ICD-10-CM | POA: Diagnosis present

## 2016-02-07 DIAGNOSIS — M25551 Pain in right hip: Secondary | ICD-10-CM | POA: Diagnosis present

## 2016-02-07 DIAGNOSIS — I252 Old myocardial infarction: Secondary | ICD-10-CM

## 2016-02-07 DIAGNOSIS — I10 Essential (primary) hypertension: Secondary | ICD-10-CM | POA: Diagnosis not present

## 2016-02-07 DIAGNOSIS — M1611 Unilateral primary osteoarthritis, right hip: Principal | ICD-10-CM

## 2016-02-07 DIAGNOSIS — Z7982 Long term (current) use of aspirin: Secondary | ICD-10-CM | POA: Diagnosis not present

## 2016-02-07 DIAGNOSIS — Z955 Presence of coronary angioplasty implant and graft: Secondary | ICD-10-CM

## 2016-02-07 DIAGNOSIS — Z79899 Other long term (current) drug therapy: Secondary | ICD-10-CM

## 2016-02-07 HISTORY — PX: TOTAL HIP ARTHROPLASTY: SHX124

## 2016-02-07 SURGERY — ARTHROPLASTY, HIP, TOTAL, ANTERIOR APPROACH
Anesthesia: Spinal | Site: Hip | Laterality: Right

## 2016-02-07 MED ORDER — METHOCARBAMOL 1000 MG/10ML IJ SOLN
500.0000 mg | Freq: Four times a day (QID) | INTRAVENOUS | Status: DC | PRN
  Filled 2016-02-07: qty 5

## 2016-02-07 MED ORDER — DILTIAZEM HCL ER COATED BEADS 240 MG PO CP24
240.0000 mg | ORAL_CAPSULE | Freq: Every day | ORAL | Status: DC
  Administered 2016-02-07 – 2016-02-10 (×4): 240 mg via ORAL
  Filled 2016-02-07 (×4): qty 1

## 2016-02-07 MED ORDER — SODIUM CHLORIDE 0.9 % IR SOLN
Status: DC | PRN
  Administered 2016-02-07: 3000 mL

## 2016-02-07 MED ORDER — HYDROMORPHONE HCL 1 MG/ML IJ SOLN
1.0000 mg | INTRAMUSCULAR | Status: DC | PRN
  Administered 2016-02-07 (×3): 1 mg via INTRAVENOUS
  Filled 2016-02-07 (×2): qty 1

## 2016-02-07 MED ORDER — 0.9 % SODIUM CHLORIDE (POUR BTL) OPTIME
TOPICAL | Status: DC | PRN
  Administered 2016-02-07: 1000 mL

## 2016-02-07 MED ORDER — MIDAZOLAM HCL 5 MG/5ML IJ SOLN
INTRAMUSCULAR | Status: DC | PRN
  Administered 2016-02-07: 2 mg via INTRAVENOUS

## 2016-02-07 MED ORDER — METHOCARBAMOL 500 MG PO TABS
ORAL_TABLET | ORAL | Status: AC
  Administered 2016-02-07: 500 mg
  Filled 2016-02-07: qty 1

## 2016-02-07 MED ORDER — MIDAZOLAM HCL 2 MG/2ML IJ SOLN
INTRAMUSCULAR | Status: AC
  Filled 2016-02-07: qty 2

## 2016-02-07 MED ORDER — TAMSULOSIN HCL 0.4 MG PO CAPS
0.4000 mg | ORAL_CAPSULE | Freq: Every day | ORAL | Status: DC
  Administered 2016-02-07 – 2016-02-10 (×4): 0.4 mg via ORAL
  Filled 2016-02-07 (×4): qty 1

## 2016-02-07 MED ORDER — OXYCODONE HCL 5 MG PO TABS
ORAL_TABLET | ORAL | Status: AC
  Administered 2016-02-07: 10 mg via ORAL
  Filled 2016-02-07: qty 2

## 2016-02-07 MED ORDER — ONDANSETRON HCL 4 MG/2ML IJ SOLN
INTRAMUSCULAR | Status: DC | PRN
  Administered 2016-02-07: 4 mg via INTRAVENOUS

## 2016-02-07 MED ORDER — PHENOL 1.4 % MT LIQD: 1.0000 | OROMUCOSAL | Status: DC | PRN

## 2016-02-07 MED ORDER — PROPOFOL 500 MG/50ML IV EMUL
INTRAVENOUS | Status: DC | PRN
  Administered 2016-02-07: 25 ug/kg/min via INTRAVENOUS

## 2016-02-07 MED ORDER — CEFAZOLIN SODIUM-DEXTROSE 2-4 GM/100ML-% IV SOLN
2.0000 g | INTRAVENOUS | Status: AC
  Administered 2016-02-07: 2 g via INTRAVENOUS
  Filled 2016-02-07: qty 100

## 2016-02-07 MED ORDER — ONDANSETRON HCL 4 MG PO TABS: 4.0000 mg | ORAL_TABLET | Freq: Four times a day (QID) | ORAL | Status: DC | PRN

## 2016-02-07 MED ORDER — OXYCODONE HCL 5 MG PO TABS
5.0000 mg | ORAL_TABLET | ORAL | Status: DC | PRN
  Administered 2016-02-07 – 2016-02-10 (×8): 10 mg via ORAL
  Filled 2016-02-07 (×7): qty 2

## 2016-02-07 MED ORDER — ASPIRIN 81 MG PO CHEW
81.0000 mg | CHEWABLE_TABLET | Freq: Two times a day (BID) | ORAL | Status: DC
  Administered 2016-02-07 – 2016-02-10 (×6): 81 mg via ORAL
  Filled 2016-02-07 (×6): qty 1

## 2016-02-07 MED ORDER — METOCLOPRAMIDE HCL 5 MG PO TABS: 5.0000 mg | ORAL_TABLET | Freq: Three times a day (TID) | ORAL | Status: DC | PRN

## 2016-02-07 MED ORDER — ONDANSETRON HCL 4 MG/2ML IJ SOLN: 4.0000 mg | Freq: Four times a day (QID) | INTRAMUSCULAR | Status: DC | PRN

## 2016-02-07 MED ORDER — ADULT MULTIVITAMIN W/MINERALS CH
1.0000 | ORAL_TABLET | Freq: Every day | ORAL | Status: DC
  Administered 2016-02-07 – 2016-02-09 (×3): 1 via ORAL
  Filled 2016-02-07 (×5): qty 1

## 2016-02-07 MED ORDER — HYDROMORPHONE HCL 1 MG/ML IJ SOLN
INTRAMUSCULAR | Status: AC
  Filled 2016-02-07: qty 1

## 2016-02-07 MED ORDER — DOCUSATE SODIUM 100 MG PO CAPS
100.0000 mg | ORAL_CAPSULE | Freq: Two times a day (BID) | ORAL | Status: DC
  Administered 2016-02-07 – 2016-02-10 (×6): 100 mg via ORAL
  Filled 2016-02-07 (×6): qty 1

## 2016-02-07 MED ORDER — CEFAZOLIN IN D5W 1 GM/50ML IV SOLN
1.0000 g | Freq: Four times a day (QID) | INTRAVENOUS | Status: AC
  Administered 2016-02-07 – 2016-02-08 (×2): 1 g via INTRAVENOUS
  Filled 2016-02-07 (×3): qty 50

## 2016-02-07 MED ORDER — GABAPENTIN 300 MG PO CAPS
300.0000 mg | ORAL_CAPSULE | Freq: Three times a day (TID) | ORAL | Status: DC
  Administered 2016-02-07 – 2016-02-10 (×9): 300 mg via ORAL
  Filled 2016-02-07 (×9): qty 1

## 2016-02-07 MED ORDER — LACTATED RINGERS IV SOLN
INTRAVENOUS | Status: DC
Start: 2016-02-07 — End: 2016-02-07
  Administered 2016-02-07: 10:00:00 via INTRAVENOUS

## 2016-02-07 MED ORDER — FENTANYL CITRATE (PF) 100 MCG/2ML IJ SOLN
INTRAMUSCULAR | Status: AC
  Filled 2016-02-07: qty 2

## 2016-02-07 MED ORDER — ONDANSETRON HCL 4 MG/2ML IJ SOLN
INTRAMUSCULAR | Status: AC
  Filled 2016-02-07: qty 2

## 2016-02-07 MED ORDER — METOPROLOL SUCCINATE ER 50 MG PO TB24
50.0000 mg | ORAL_TABLET | Freq: Every day | ORAL | Status: DC
  Administered 2016-02-08 – 2016-02-10 (×3): 50 mg via ORAL
  Filled 2016-02-07 (×3): qty 1

## 2016-02-07 MED ORDER — MENTHOL 3 MG MT LOZG: 1.0000 | LOZENGE | OROMUCOSAL | Status: DC | PRN

## 2016-02-07 MED ORDER — ACETAMINOPHEN 650 MG RE SUPP: 650.0000 mg | Freq: Four times a day (QID) | RECTAL | Status: DC | PRN

## 2016-02-07 MED ORDER — FENTANYL CITRATE (PF) 100 MCG/2ML IJ SOLN: 25.0000 ug | INTRAMUSCULAR | Status: DC | PRN

## 2016-02-07 MED ORDER — SODIUM CHLORIDE 0.9 % IV SOLN: INTRAVENOUS | Status: DC

## 2016-02-07 MED ORDER — ALUM & MAG HYDROXIDE-SIMETH 200-200-20 MG/5ML PO SUSP: 30.0000 mL | ORAL | Status: DC | PRN

## 2016-02-07 MED ORDER — METHOCARBAMOL 500 MG PO TABS: 500.0000 mg | ORAL_TABLET | Freq: Four times a day (QID) | ORAL | Status: DC | PRN

## 2016-02-07 MED ORDER — PROPOFOL 10 MG/ML IV BOLUS
INTRAVENOUS | Status: AC
  Filled 2016-02-07: qty 20

## 2016-02-07 MED ORDER — METOCLOPRAMIDE HCL 5 MG/ML IJ SOLN: 5.0000 mg | Freq: Three times a day (TID) | INTRAMUSCULAR | Status: DC | PRN

## 2016-02-07 MED ORDER — ACETAMINOPHEN 325 MG PO TABS: 650.0000 mg | ORAL_TABLET | Freq: Four times a day (QID) | ORAL | Status: DC | PRN

## 2016-02-07 MED ORDER — LACTATED RINGERS IV SOLN
INTRAVENOUS | Status: DC | PRN
  Administered 2016-02-07: 13:00:00 via INTRAVENOUS

## 2016-02-07 MED ORDER — DIPHENHYDRAMINE HCL 12.5 MG/5ML PO ELIX: 12.5000 mg | ORAL_SOLUTION | ORAL | Status: DC | PRN

## 2016-02-07 MED ORDER — ZOLPIDEM TARTRATE 5 MG PO TABS: 5.0000 mg | ORAL_TABLET | Freq: Every evening | ORAL | Status: DC | PRN

## 2016-02-07 MED ORDER — CHLORHEXIDINE GLUCONATE 4 % EX LIQD: 60.0000 mL | Freq: Once | CUTANEOUS | Status: DC

## 2016-02-07 SURGICAL SUPPLY — 53 items
APL SKNCLS STERI-STRIP NONHPOA (GAUZE/BANDAGES/DRESSINGS) ×1
BENZOIN TINCTURE PRP APPL 2/3 (GAUZE/BANDAGES/DRESSINGS) ×2 IMPLANT
BLADE SAW SGTL 18X1.27X75 (BLADE) ×2 IMPLANT
BLADE SURG ROTATE 9660 (MISCELLANEOUS) IMPLANT
CAPT HIP TOTAL 2 ×1 IMPLANT
CELLS DAT CNTRL 66122 CELL SVR (MISCELLANEOUS) ×1 IMPLANT
CLSR STERI-STRIP ANTIMIC 1/2X4 (GAUZE/BANDAGES/DRESSINGS) ×1 IMPLANT
COVER SURGICAL LIGHT HANDLE (MISCELLANEOUS) ×2 IMPLANT
DRAPE C-ARM 42X72 X-RAY (DRAPES) ×2 IMPLANT
DRAPE STERI IOBAN 125X83 (DRAPES) ×2 IMPLANT
DRAPE U-SHAPE 47X51 STRL (DRAPES) ×6 IMPLANT
DRSG AQUACEL AG ADV 3.5X10 (GAUZE/BANDAGES/DRESSINGS) ×2 IMPLANT
DURAPREP 26ML APPLICATOR (WOUND CARE) ×2 IMPLANT
ELECT BLADE 4.0 EZ CLEAN MEGAD (MISCELLANEOUS) ×2
ELECT BLADE 6.5 EXT (BLADE) IMPLANT
ELECT REM PT RETURN 9FT ADLT (ELECTROSURGICAL) ×2
ELECTRODE BLDE 4.0 EZ CLN MEGD (MISCELLANEOUS) ×1 IMPLANT
ELECTRODE REM PT RTRN 9FT ADLT (ELECTROSURGICAL) ×1 IMPLANT
FACESHIELD WRAPAROUND (MASK) ×4 IMPLANT
FACESHIELD WRAPAROUND OR TEAM (MASK) ×2 IMPLANT
GLOVE BIOGEL PI IND STRL 8 (GLOVE) ×2 IMPLANT
GLOVE BIOGEL PI INDICATOR 8 (GLOVE) ×2
GLOVE ECLIPSE 8.0 STRL XLNG CF (GLOVE) ×2 IMPLANT
GLOVE ORTHO TXT STRL SZ7.5 (GLOVE) ×4 IMPLANT
GOWN STRL REUS W/ TWL LRG LVL3 (GOWN DISPOSABLE) ×2 IMPLANT
GOWN STRL REUS W/ TWL XL LVL3 (GOWN DISPOSABLE) ×2 IMPLANT
GOWN STRL REUS W/TWL LRG LVL3 (GOWN DISPOSABLE) ×4
GOWN STRL REUS W/TWL XL LVL3 (GOWN DISPOSABLE) ×4
HANDPIECE INTERPULSE COAX TIP (DISPOSABLE) ×2
KIT BASIN OR (CUSTOM PROCEDURE TRAY) ×2 IMPLANT
KIT ROOM TURNOVER OR (KITS) ×2 IMPLANT
MANIFOLD NEPTUNE II (INSTRUMENTS) ×2 IMPLANT
NS IRRIG 1000ML POUR BTL (IV SOLUTION) ×2 IMPLANT
PACK TOTAL JOINT (CUSTOM PROCEDURE TRAY) ×2 IMPLANT
PAD ARMBOARD 7.5X6 YLW CONV (MISCELLANEOUS) ×2 IMPLANT
RETRACTOR WND ALEXIS 18 MED (MISCELLANEOUS) ×1 IMPLANT
RTRCTR WOUND ALEXIS 18CM MED (MISCELLANEOUS) ×2
SET HNDPC FAN SPRY TIP SCT (DISPOSABLE) ×1 IMPLANT
SLEEVE SURGEON STRL (DRAPES) ×1 IMPLANT
STAPLER VISISTAT 35W (STAPLE) IMPLANT
STRIP CLOSURE SKIN 1/2X4 (GAUZE/BANDAGES/DRESSINGS) ×4 IMPLANT
SUT ETHIBOND NAB CT1 #1 30IN (SUTURE) ×2 IMPLANT
SUT MNCRL AB 4-0 PS2 18 (SUTURE) IMPLANT
SUT VIC AB 0 CT1 27 (SUTURE) ×2
SUT VIC AB 0 CT1 27XBRD ANBCTR (SUTURE) ×1 IMPLANT
SUT VIC AB 1 CT1 27 (SUTURE) ×2
SUT VIC AB 1 CT1 27XBRD ANBCTR (SUTURE) ×1 IMPLANT
SUT VIC AB 2-0 CT1 27 (SUTURE) ×2
SUT VIC AB 2-0 CT1 TAPERPNT 27 (SUTURE) ×1 IMPLANT
TOWEL OR 17X24 6PK STRL BLUE (TOWEL DISPOSABLE) ×2 IMPLANT
TOWEL OR 17X26 10 PK STRL BLUE (TOWEL DISPOSABLE) ×2 IMPLANT
TRAY FOLEY CATH 16FRSI W/METER (SET/KITS/TRAYS/PACK) ×1 IMPLANT
WATER STERILE IRR 1000ML POUR (IV SOLUTION) ×2 IMPLANT

## 2016-02-07 NOTE — Anesthesia Procedure Notes (Signed)
Spinal  Patient location during procedure: OR Preanesthetic Checklist Completed: patient identified, site marked, surgical consent, pre-op evaluation, timeout performed, IV checked, risks and benefits discussed and monitors and equipment checked Spinal Block Patient position: sitting Prep: DuraPrep Patient monitoring: heart rate, cardiac monitor, continuous pulse ox and blood pressure Approach: midline Location: L3-4 Injection technique: single-shot Needle Needle type: Sprotte  Needle gauge: 24 G Needle length: 9 cm Assessment Sensory level: T4 Additional Notes Spinal Dosage in OR  Bupivicaine ml       1.9 RLD x 5 min

## 2016-02-07 NOTE — Anesthesia Procedure Notes (Signed)
Procedure Name: MAC Date/Time: 02/07/2016 12:55 PM Performed by: Izora Gala Pre-anesthesia Checklist: Patient identified, Emergency Drugs available, Suction available and Patient being monitored Patient Re-evaluated:Patient Re-evaluated prior to inductionOxygen Delivery Method: Nasal cannula Placement Confirmation: positive ETCO2

## 2016-02-07 NOTE — H&P (Signed)
TOTAL HIP ADMISSION H&P  Patient is admitted for right total hip arthroplasty.  Subjective:  Chief Complaint: right hip pain  HPI: Eric Beltran, 75 y.o. male, has a history of pain and functional disability in the right hip(s) due to arthritis and patient has failed non-surgical conservative treatments for greater than 12 weeks to include NSAID's and/or analgesics, flexibility and strengthening excercises, use of assistive devices, weight reduction as appropriate and activity modification.  Onset of symptoms was gradual starting 2 years ago with gradually worsening course since that time.The patient noted no past surgery on the right hip(s).  Patient currently rates pain in the right hip at 10 out of 10 with activity. Patient has night pain, worsening of pain with activity and weight bearing, trendelenberg gait, pain that interfers with activities of daily living and pain with passive range of motion. Patient has evidence of subchondral cysts, subchondral sclerosis, periarticular osteophytes and joint space narrowing by imaging studies. This condition presents safety issues increasing the risk of falls.  There is no current active infection.  Patient Active Problem List   Diagnosis Date Noted  . Osteoarthritis of right hip 02/07/2016  . Abnormal cardiovascular function study   . Abnormal myocardial perfusion study 06/09/2014  . Cardiac murmur 05/21/2014  . Aneurysm of thoracic aorta ([place]) 04/13/2010  . Mixed hyperlipidemia 07/20/2009  . Essential hypertension, benign 07/20/2009  . Coronary atherosclerosis of native coronary artery 07/20/2009   Past Medical History:  Diagnosis Date  . Ascending aortic aneurysm [facility])    Status post repair 12/2011 [name]  . Atrial fibrillation ([facility])    Perioperative  . BPH (benign prostatic hyperplasia)   . Coronary atherosclerosis of native coronary artery    Multivessel status post prior stenting and ultimately CABG 12/2011 at [facility]  . Essential  hypertension, benign   . Mixed hyperlipidemia   . Myocardial infarction ([place])   . Supraventricular tachycardia [facility])     Past Surgical History:  Procedure Laterality Date  . ASCENDING AORTIC ANEURYSM REPAIR  12/2011 [facility]   26 mm.Dacron graft  . CORONARY ARTERY BYPASS GRAFT  12/2011 [facility]   LIMA to LAD, SVG to PLB, SVG to ramus, SVG to OM  . HERNIA REPAIR    . LEFT HEART CATHETERIZATION WITH CORONARY ANGIOGRAM N/A 06/18/2014   Procedure: LEFT HEART CATHETERIZATION WITH CORONARY ANGIOGRAM;  Surgeon: Blane Ohara, MD;  Location: [facility] CATH LAB;  Service: Cardiovascular;  Laterality: N/A;  . TRANSURETHRAL RESECTION OF PROSTATE      Prescriptions Prior to Admission  Medication Sig Dispense Refill Last Dose  . aspirin EC 81 MG tablet Take 81 mg by mouth at bedtime.    Past Week at Unknown time  . atorvastatin (LIPITOR) 40 MG tablet TAKE 1 TABLET BY MOUTH AT BEDTIME 90 tablet 3 02/06/2016 at Unknown time  . diltiazem (CARDIZEM CD) 240 MG 24 hr capsule TAKE 1 CAPSULE BY MOUTH EVERY DAY 30 capsule 3 02/06/2016 at Unknown time  . gabapentin (NEURONTIN) 300 MG capsule TAKE 1 CAPSULE BY MOUTH THREE TIMES DAILY AS DIRECTED 90 capsule 2 Past Week at Unknown time  . metoprolol succinate (TOPROL-XL) 50 MG 24 hr tablet Take 1 tablet (50 mg total) by mouth daily. 30 tablet 6 02/06/2016 at Unknown time  . Multiple Vitamin (MULTIVITAMIN) tablet Take 1 tablet by mouth at bedtime.    Past Week at Unknown time  . naproxen (NAPROSYN) 500 MG tablet TAKE 1 TABLET BY MOUTH TWICE DAILY AFTER MEALS (Patient taking differently: TAKE  1 TABLET BY MOUTH AT BEDTIME) 60 tablet 5 Past Week at Unknown time  . Omega-3 Fatty Acids (FISH OIL) 1000 MG CAPS Take 1 capsule by mouth daily.     Past Week at Unknown time  . tadalafil (CIALIS) 5 MG tablet Take 5 mg by mouth as needed for erectile dysfunction.   Taking  . Tamsulosin HCl (FLOMAX) 0.4 MG CAPS Take 0.4 mg by mouth daily.    02/06/2016 at Unknown time  . nitroGLYCERIN  (NITROSTAT) 0.4 MG SL tablet Place 1 tablet (0.4 mg total) under the tongue every 5 (five) minutes as needed for chest pain. 90 tablet 3 More than a month at Unknown time   No Known Allergies  Social History  Substance Use Topics  . Smoking status: Never Smoker  . Smokeless tobacco: Never Used     Comment: tobacco use - no  . Alcohol use 0.0 oz/week     Comment: Occasional    History reviewed. No pertinent family history.   Review of Systems  Musculoskeletal: Positive for joint pain.  All other systems reviewed and are negative.   Objective:  Physical Exam  Constitutional: He is oriented to person, place, and time. He appears well-developed and well-nourished.  HENT:  Head: Normocephalic and atraumatic.  Eyes: EOM are normal. Pupils are equal, round, and reactive to light.  Neck: Normal range of motion. Neck supple.  Cardiovascular: Normal rate and regular rhythm.   Respiratory: Effort normal and breath sounds normal.  GI: Soft. Bowel sounds are normal.  Musculoskeletal:       Right hip: He exhibits decreased range of motion, decreased strength, tenderness and bony tenderness.  Neurological: He is alert and oriented to person, place, and time.  Skin: Skin is warm and dry.  Psychiatric: He has a normal mood and affect.    Vital signs in last 24 hours: Temp:  [97.7 F (36.5 C)] 97.7 F (36.5 C) (09/26 0930) Pulse Rate:  [52] 52 (09/26 0930) Resp:  [18] 18 (09/26 0930) BP: (160)/(73) 160/73 (09/26 0930) SpO2:  [95 %] 95 % (09/26 0930) Weight:  [87.1 kg (192 lb)] 87.1 kg (192 lb) (09/26 0942)  Labs:   Estimated body mass index is 30.07 kg/m as calculated from the following:   Height as of this encounter: 5\' 7"  (1.702 m).   Weight as of this encounter: 87.1 kg (192 lb).   Imaging Review Plain radiographs demonstrate severe degenerative joint disease of the right hip(s). The bone quality appears to be good for age and reported activity  level.  Assessment/Plan:  End stage arthritis, right hip(s)  The patient history, physical examination, clinical judgement of the provider and imaging studies are consistent with end stage degenerative joint disease of the right hip(s) and total hip arthroplasty is deemed medically necessary. The treatment options including medical management, injection therapy, arthroscopy and arthroplasty were discussed at length. The risks and benefits of total hip arthroplasty were presented and reviewed. The risks due to aseptic loosening, infection, stiffness, dislocation/subluxation,  thromboembolic complications and other imponderables were discussed.  The patient acknowledged the explanation, agreed to proceed with the plan and consent was signed. Patient is being admitted for inpatient treatment for surgery, pain control, PT, OT, prophylactic antibiotics, VTE prophylaxis, progressive ambulation and ADL's and discharge planning.The patient is planning to be discharged to skilled nursing facility

## 2016-02-07 NOTE — Anesthesia Preprocedure Evaluation (Signed)
Anesthesia Evaluation  Patient identified by MRN, date of birth, ID band Patient awake    Reviewed: Allergy & Precautions, H&P , Patient's Chart, lab work & pertinent test results  Airway Mallampati: II  TM Distance: >3 FB Neck ROM: full    Dental no notable dental hx.    Pulmonary    Pulmonary exam normal breath sounds clear to auscultation       Cardiovascular Exercise Tolerance: Good hypertension,  Rhythm:regular Rate:Normal     Neuro/Psych    GI/Hepatic   Endo/Other    Renal/GU      Musculoskeletal   Abdominal   Peds  Hematology   Anesthesia Other Findings No anticoags No CAD sx since Cardiac surg; occluded grafts noted  Reproductive/Obstetrics                             Anesthesia Physical Anesthesia Plan  ASA: III  Anesthesia Plan: Spinal   Post-op Pain Management:    Induction:   Airway Management Planned:   Additional Equipment:   Intra-op Plan:   Post-operative Plan:   Informed Consent: I have reviewed the patients History and Physical, chart, labs and discussed the procedure including the risks, benefits and alternatives for the proposed anesthesia with the patient or authorized representative who has indicated his/her understanding and acceptance.   Dental Advisory Given  Plan Discussed with: CRNA  Anesthesia Plan Comments: (Lab work confirmed with CRNA in room. Platelets okay. Discussed spinal anesthetic, and patient consents to the procedure:  included risk of possible headache,backache, failed block, allergic reaction, and nerve injury. This patient was asked if she had any questions or concerns before the procedure started. )        Anesthesia Quick Evaluation

## 2016-02-07 NOTE — Transfer of Care (Signed)
Immediate Anesthesia Transfer of Care Note  Patient: Eric Beltran  Procedure(s) Performed: Procedure(s): RIGHT TOTAL HIP ARTHROPLASTY ANTERIOR APPROACH (Right)  Patient Location: PACU  Anesthesia Type:Spinal  Level of Consciousness: awake, alert , oriented and patient cooperative  Airway & Oxygen Therapy: Patient Spontanous Breathing  Post-op Assessment: Report given to RN and Post -op Vital signs reviewed and stable  Post vital signs: Reviewed and stable  Last Vitals:  Vitals:   02/07/16 0930  BP: (!) 160/73  Pulse: (!) 52  Resp: 18  Temp: 36.5 C    Last Pain:  Vitals:   02/07/16 0930  TempSrc: Oral         Complications: No apparent anesthesia complications

## 2016-02-07 NOTE — Brief Op Note (Signed)
02/07/2016  2:23 PM  PATIENT:  Eric Beltran  75 y.o. male  PRE-OPERATIVE DIAGNOSIS:  Osteoarthritis right hip  POST-OPERATIVE DIAGNOSIS:  Osteoarthritis right hip  PROCEDURE:  Procedure(s): RIGHT TOTAL HIP ARTHROPLASTY ANTERIOR APPROACH (Right)  SURGEON:  Surgeon(s) and Role:    * Mcarthur Rossetti, MD - Primary  PHYSICIAN ASSISTANT: Benita Stabile, PA-C  ANESTHESIA:   spinal  EBL:  Total I/O In: -  Out: 850 [Urine:650; Blood:200]  COUNTS:  YES   DICTATION: .Other Dictation: Dictation Number [id number]  PLAN OF CARE: Admit to inpatient   PATIENT DISPOSITION:  PACU - hemodynamically stable.   Delay start of Pharmacological VTE agent (>24hrs) due to surgical blood loss or risk of bleeding: no

## 2016-02-08 ENCOUNTER — Encounter (HOSPITAL_COMMUNITY): Payer: Self-pay | Admitting: Orthopaedic Surgery

## 2016-02-08 LAB — CBC
HEMATOCRIT: 40.7 % (ref 39.0–52.0)
Hemoglobin: 12.9 g/dL — ABNORMAL LOW (ref 13.0–17.0)
MCH: 27.8 pg (ref 26.0–34.0)
MCHC: 31.7 g/dL (ref 30.0–36.0)
MCV: 87.7 fL (ref 78.0–100.0)
Platelets: 196 10*3/uL (ref 150–400)
RBC: 4.64 MIL/uL (ref 4.22–5.81)
RDW: 14.5 % (ref 11.5–15.5)
WBC: 15.6 10*3/uL — ABNORMAL HIGH (ref 4.0–10.5)

## 2016-02-08 LAB — BASIC METABOLIC PANEL
Anion gap: 7 (ref 5–15)
BUN: 13 mg/dL (ref 6–20)
CALCIUM: 8.6 mg/dL — AB (ref 8.9–10.3)
CO2: 25 mmol/L (ref 22–32)
CREATININE: 1.06 mg/dL (ref 0.61–1.24)
Chloride: 104 mmol/L (ref 101–111)
GFR calc non Af Amer: >60 mL/min (ref >60)
GLUCOSE: 127 mg/dL — AB (ref 65–99)
Potassium: 3.9 mmol/L (ref 3.5–5.1)
Sodium: 136 mmol/L (ref 135–145)

## 2016-02-08 NOTE — Progress Notes (Signed)
Physical Therapy Treatment Patient Details Name: Eric Beltran MRN: [id number] DOB: [birth year] Today's Date: 02/08/2016    History of Present Illness Pt is a 75 y/o male s/p R THA (anterior approach). PMH including but not limited to HTN, aneurysm thoracic aorta (2011), hx of MI, and CABG in 2013.    PT Comments    Pt presented sitting OOB in recliner when PT entered room. Pt making good progress towards achieving his functional goals, and increasing distance ambulated this session. PT planning to perform stair training at next session. Pt would continue to benefit from skilled physical therapy services at this time while admitted and after d/c to address his limitations in order to improve his overall safety and independence with functional mobility.   Follow Up Recommendations  Home health PT;Supervision for mobility/OOB     Equipment Recommendations  Rolling walker with 5" wheels;3in1 (PT)    Recommendations for Other Services       Precautions / Restrictions Precautions Precautions: Fall Restrictions Weight Bearing Restrictions: Yes RLE Weight Bearing: Weight bearing as tolerated    Mobility  Bed Mobility     General bed mobility comments: pt sitting OOB in recliner when PT entered room  Transfers Overall transfer level: Needs assistance Equipment used: Rolling walker (2 wheeled) Transfers: Sit to/from Stand Sit to Stand: Min guard         General transfer comment: pt required increased time and VC'ing for bilateral hand positioning  Ambulation/Gait Ambulation/Gait assistance: Min guard Ambulation Distance (Feet): 150 Feet Assistive device: Rolling walker (2 wheeled) Gait Pattern/deviations: Step-through pattern;Decreased step length - left;Decreased stance time - right;Decreased weight shift to right Gait velocity: decreased Gait velocity interpretation: Below normal speed for age/gender General Gait Details: pt required occasional VC'ing for forward  gaze   Stairs            Wheelchair Mobility    Modified Rankin (Stroke Patients Only)       Balance Overall balance assessment: Needs assistance Sitting-balance support: Feet supported;No upper extremity supported Sitting balance-Leahy Scale: Fair     Standing balance support: During functional activity;Bilateral upper extremity supported Standing balance-Leahy Scale: Poor                      Cognition Arousal/Alertness: Awake/alert Behavior During Therapy: WFL for tasks assessed/performed Overall Cognitive Status: Within Functional Limits for tasks assessed                      Exercises Total Joint Exercises Quad Sets: AROM;Strengthening;Right;10 reps;Seated Heel Slides: AAROM;Right;10 reps;Seated Hip ABduction/ADduction: AAROM;Right;10 reps;Seated     General Comments        Pertinent Vitals/Pain Pain Assessment: Faces Pain Score: 4  Faces Pain Scale: Hurts little more Pain Location: R hip Pain Descriptors / Indicators: Guarding;Sore;Burning Pain Intervention(s): Monitored during session;Repositioned    Home Living Family/patient expects to be discharged to:: Private residence Living Arrangements: Alone Available Help at Discharge: Family;Friend(s);Available 24 hours/day Type of Home: House Home Access: Stairs to enter Entrance Stairs-Rails: Can reach both Home Layout: Multi-level;Able to live on main level with bedroom/bathroom Home Equipment: None      Prior Function Level of Independence: Independent          PT Goals (current goals can now be found in the care plan section) Acute Rehab PT Goals Patient Stated Goal: to return home PT Goal Formulation: With patient Time For Goal Achievement: 02/15/16 Potential to Achieve Goals: Good Progress towards PT goals:  Progressing toward goals    Frequency    7X/week      PT Plan Current plan remains appropriate    Co-evaluation             End of Session  Equipment Utilized During Treatment: Gait belt Activity Tolerance: Patient limited by fatigue;Patient limited by pain Patient left: in chair;with call bell/phone within reach;with SCD's reapplied     Time: [id number] PT Time Calculation (min) (ACUTE ONLY): 19 min  Charges:  $Gait Training: 8-22 mins                    G CodesClearnce Sorrel Garron Eline 02-28-2016, 2:28 PM Sherie Don, [place], DPT [phone]

## 2016-02-08 NOTE — Evaluation (Signed)
Occupational Therapy Evaluation Patient Details Name: Eric Beltran MRN: [id number] DOB: 1940/12/21 Today's Date: 02/08/2016    History of Present Illness Pt is a 75 y/o male s/p R THA (anterior approach). PMH including but not limited to HTN, aneurysm thoracic aorta (2011), hx of MI, and CABG in 2013.   Clinical Impression   PTA, pt was independent with all ADL. Pt s/p R total hip arthroplasty with anterior approach. Currently, pt requires maximum assistance for LB dressing and moderate assistance for LB bathing as well as min assist for safety and to maintain balance with tub transfer. Pt plans to D/C home with 24 hour assistance from a friend. OT recommends 3-in-1 commode chair and home health OT to increase safety with ADLs at home. OT will continue to follow acutely to address increased safety with shower transfer and LB ADLs.    Follow Up Recommendations  Home health OT;Supervision/Assistance - 24 hour    Equipment Recommendations  3 in 1 bedside comode    Recommendations for Other Services       Precautions / Restrictions Precautions Precautions: Fall Restrictions Weight Bearing Restrictions: Yes RLE Weight Bearing: Weight bearing as tolerated      Mobility Bed Mobility Overal bed mobility: Needs Assistance Bed Mobility: Supine to Sit     Supine to sit: Min assist;HOB elevated     General bed mobility comments: Pt up in chair upon OT arrival  Transfers Overall transfer level: Needs assistance Equipment used: Rolling walker (2 wheeled) Transfers: Sit to/from Stand Sit to Stand: Min guard         General transfer comment: pt required increased time and VC'ing for bilateral hand positioning    Balance Overall balance assessment: Needs assistance Sitting-balance support: No upper extremity supported;Feet supported Sitting balance-Leahy Scale: Fair     Standing balance support: During functional activity;Bilateral upper extremity supported Standing  balance-Leahy Scale: Poor                              ADL Overall ADL's : Needs assistance/impaired Eating/Feeding: Supervision/ safety;Set up;Sitting   Grooming: Wash/dry hands;Min guard;Standing   Upper Body Bathing: Supervision/ safety;Set up;Sitting   Lower Body Bathing: Moderate assistance;Sit to/from stand Lower Body Bathing Details (indicate cue type and reason): min guard sit<>stand Upper Body Dressing : Supervision/safety;Set up;Sitting   Lower Body Dressing: Maximal assistance;Sit to/from stand Lower Body Dressing Details (indicate cue type and reason): min guard sit<>stand Toilet Transfer: Min guard;Ambulation;RW;Comfort height toilet   Toileting- Clothing Manipulation and Hygiene: Min guard;Sit to/from stand   Tub/ Shower Transfer: 3 in 1;Rolling walker;Minimal assistance;Tub transfer   Functional mobility during ADLs: Maximal assistance (Max assist for LB dressing; min guard for mobility)       Vision     Perception     Praxis      Pertinent Vitals/Pain Pain Assessment: Faces Pain Score: 4  Faces Pain Scale: Hurts even more Pain Location: R hip Pain Descriptors / Indicators: Operative site guarding;Sore Pain Intervention(s): Monitored during session;Repositioned     Hand Dominance Right   Extremity/Trunk Assessment Upper Extremity Assessment Upper Extremity Assessment: Overall WFL for tasks assessed   Lower Extremity Assessment Lower Extremity Assessment: RLE deficits/detail RLE Deficits / Details: Decreased strength and ROM as expected post-operatively.       Communication Communication Communication: No difficulties   Cognition Arousal/Alertness: Awake/alert Behavior During Therapy: WFL for tasks assessed/performed Overall Cognitive Status: Within Functional Limits for tasks assessed  General Comments       Exercises Exercises: Total Joint     Shoulder Instructions      Home Living  Family/patient expects to be discharged to:: Private residence Living Arrangements: Alone Available Help at Discharge: Family;Friend(s);Available 24 hours/day Type of Home: House Home Access: Stairs to enter [employer] of Steps: 4 Entrance Stairs-Rails: Can reach both Home Layout: Multi-level;Able to live on main level with bedroom/bathroom     Bathroom Shower/Tub: Door;Tub/shower unit;Curtain (Pt has access to bathroom with both door and curtain.) Shower/tub characteristics: Architectural technologist: Standard Bathroom Accessibility: Yes How Accessible: Accessible via walker Home Equipment: None          Prior Functioning/Environment Level of Independence: Independent                 OT Problem List: Decreased strength;Decreased range of motion;Decreased activity tolerance;Impaired balance (sitting and/or standing);Decreased knowledge of use of DME or AE;Pain   OT Treatment/Interventions: Self-care/ADL training;Therapeutic activities    OT Goals(Current goals can be found in the care plan section) Acute Rehab OT Goals Patient Stated Goal: to return home OT Goal Formulation: With patient Time For Goal Achievement: 02/22/16 Potential to Achieve Goals: Good ADL Goals Pt Will Perform Lower Body Bathing: with min guard assist;sit to/from stand Pt Will Perform Lower Body Dressing: with min guard assist;sit to/from stand Pt Will Transfer to Toilet: with supervision;regular height toilet;grab bars Pt Will Perform Tub/Shower Transfer: Tub transfer;3 in 1;rolling walker;with supervision  OT Frequency: Min 2X/week   Barriers to D/C:            Co-evaluation              End of Session Equipment Utilized During Treatment: Gait belt;Rolling walker Nurse Communication: Other (comment) (Recommendations )  Activity Tolerance: Patient tolerated treatment well Patient left: in chair;with call bell/phone within reach   Time: 1105-1140 OT Time Calculation  (min): 35 min Charges:  OT General Charges $OT Visit: 1 Procedure OT Evaluation $OT Eval Moderate Complexity: 1 Procedure OT Treatments $Self Care/Home Management : 8-22 mins G-Codes:    Norman Herrlich, OTR/L [phone] 02/08/2016, 12:32 PM

## 2016-02-08 NOTE — Care Management Note (Signed)
Case Management Note  Patient Details  Name: Eric Beltran MRN: [id number] Date of Birth: 1941-01-13  Subjective/Objective:  75 yr old gentleman s/p right total hip arthroplasty.                  Action/Plan: Case manager spoke with patient concerning [name] and DME needs. Patient was preoperatively setup with [facility], he asked if he could change [place] agency. Case manager explained process of choice, referral was made to [place] liaison, karen. Cm also notified Ubaldo Glassing, [facility].  Patient states he will have support at discharge.   Expected Discharge Date:    02/09/16              Expected Discharge Plan:  [place]  In-House Referral:     Discharge planning Services  CM Consult  Post Acute Care Choice:  Durable Medical Equipment, Home Health Choice offered to:  Patient  DME Arranged:  3-N-1, Walker rolling DME Agency:  [place]:  PT, OT [facility] Agency:  [place]  Status of Service:     If discussed at [place] of Stay Meetings, dates discussed:    Additional Comments:  Ninfa Meeker, RN 02/08/2016, 2:17 PM

## 2016-02-08 NOTE — Progress Notes (Signed)
Subjective: 1 Day Post-Op Procedure(s) (LRB): RIGHT TOTAL HIP ARTHROPLASTY ANTERIOR APPROACH (Right) Patient reports pain as moderate.    Objective: Vital signs in last 24 hours: Temp:  [97.3 F (36.3 C)-97.9 F (36.6 C)] 97.9 F (36.6 C) (09/27 0541) Pulse Rate:  [43-60] 60 (09/26 2352) Resp:  [12-20] 20 (09/26 1729) BP: (110-188)/(49-94) 142/49 (09/27 0541) SpO2:  [95 %-100 %] 98 % (09/27 0541) Weight:  [87.1 kg (192 lb)] 87.1 kg (192 lb) (09/26 0942)  Intake/Output from previous day: 09/26 0701 - 09/27 0700 In: 1300 [P.O.:200; I.V.:1000; IV Piggyback:100] Out: 1450 [Urine:1250; Blood:200] Intake/Output this shift: No intake/output data recorded.  No results for input(s): HGB in the last 72 hours. No results for input(s): WBC, RBC, HCT, PLT in the last 72 hours. No results for input(s): NA, K, CL, CO2, BUN, CREATININE, GLUCOSE, CALCIUM in the last 72 hours. No results for input(s): LABPT, INR in the last 72 hours.  Sensation intact distally Intact pulses distally Dorsiflexion/Plantar flexion intact Incision: dressing C/D/I  Assessment/Plan: 1 Day Post-Op Procedure(s) (LRB): RIGHT TOTAL HIP ARTHROPLASTY ANTERIOR APPROACH (Right) Up with therapy  Eric Beltran 02/08/2016, 7:13 AM

## 2016-02-08 NOTE — Anesthesia Postprocedure Evaluation (Signed)
Anesthesia Post Note  Patient: Eric Beltran  Procedure(s) Performed: Procedure(s) (LRB): RIGHT TOTAL HIP ARTHROPLASTY ANTERIOR APPROACH (Right)  Patient location during evaluation: PACU Anesthesia Type: Spinal and MAC Level of consciousness: awake and alert Pain management: pain level controlled Vital Signs Assessment: post-procedure vital signs reviewed and stable Respiratory status: spontaneous breathing and respiratory function stable Cardiovascular status: blood pressure returned to baseline and stable Postop Assessment: spinal receding Anesthetic complications: no    Last Vitals:  Vitals:   02/07/16 2352 02/08/16 0541  BP: (!) 130/56 (!) 142/49  Pulse: 60   Resp:    Temp: 36.4 C 36.6 C    Last Pain:  Vitals:   02/08/16 0541  TempSrc: Oral  PainSc:                  Eric Beltran

## 2016-02-08 NOTE — Evaluation (Signed)
Physical Therapy Evaluation Patient Details Name: Eric Beltran MRN: [id number] DOB: [birth year] Today's Date: 02/08/2016   History of Present Illness  Pt is a 75 y/o male s/p R THA (anterior approach). PMH including but not limited to HTN, aneurysm thoracic aorta (2011), hx of MI, and CABG in 2013.  Clinical Impression  Pt presented supine in bed with HOB elevated, awake and willing to participate in therapy session. Pt stated that prior to admission he was independent with all functional mobility and ADL's. Pt moved well during evaluation but was limited secondary to pain and fatigue. Pt would continue to benefit from skilled physical therapy services at this time while admitted and after d/c to address his below listed limitations in order to improve his overall safety and independence with functional mobility.     Follow Up Recommendations Home health PT;Supervision for mobility/OOB    Equipment Recommendations  Rolling walker with 5" wheels;3in1 (PT)    Recommendations for Other Services       Precautions / Restrictions Precautions Precautions: Fall Restrictions Weight Bearing Restrictions: Yes RLE Weight Bearing: Weight bearing as tolerated      Mobility  Bed Mobility Overal bed mobility: Needs Assistance Bed Mobility: Supine to Sit     Supine to sit: Min assist;HOB elevated     General bed mobility comments: pt required increased time and min A with R LE movement  Transfers Overall transfer level: Needs assistance Equipment used: Rolling walker (2 wheeled) Transfers: Sit to/from Stand Sit to Stand: Min guard         General transfer comment: pt required increased time and VC'ing for bilateral hand positioning  Ambulation/Gait Ambulation/Gait assistance: Min guard Ambulation Distance (Feet): 20 Feet Assistive device: Rolling walker (2 wheeled) Gait Pattern/deviations: Step-to pattern;Decreased step length - left;Decreased stance time - right;Decreased  weight shift to right Gait velocity: decreased Gait velocity interpretation: Below normal speed for age/gender    Stairs            Wheelchair Mobility    Modified Rankin (Stroke Patients Only)       Balance Overall balance assessment: Needs assistance Sitting-balance support: Feet supported;No upper extremity supported Sitting balance-Leahy Scale: Fair     Standing balance support: During functional activity;Bilateral upper extremity supported Standing balance-Leahy Scale: Poor                               Pertinent Vitals/Pain Pain Assessment: Faces Pain Score: 4  Faces Pain Scale: Hurts even more Pain Location: R hip Pain Descriptors / Indicators: Operative site guarding;Sore Pain Intervention(s): Monitored during session;Repositioned    Home Living Family/patient expects to be discharged to:: Private residence Living Arrangements: Alone Available Help at Discharge: Family;Friend(s);Available 24 hours/day Type of Home: House Home Access: Stairs to enter Entrance Stairs-Rails: Can reach both Entrance Stairs-Number of Steps: 4 Home Layout: Multi-level;Able to live on main level with bedroom/bathroom Home Equipment: None      Prior Function Level of Independence: Independent               Hand Dominance   Dominant Hand: Right    Extremity/Trunk Assessment   Upper Extremity Assessment: Overall WFL for tasks assessed           Lower Extremity Assessment: RLE deficits/detail RLE Deficits / Details: Decreased strength and ROM as expected post-operatively.       Communication   Communication: No difficulties  Cognition Arousal/Alertness: Awake/alert Behavior During Therapy:  WFL for tasks assessed/performed Overall Cognitive Status: Within Functional Limits for tasks assessed                      General Comments      Exercises Total Joint Exercises Ankle Circles/Pumps: AROM;Both;10 reps;Seated Long Arc Quad:  AROM;Strengthening;Right;10 reps;Seated Marching in Standing: AROM;Both;10 reps;Seated   Assessment/Plan    PT Assessment Patient needs continued PT services  PT Problem List Decreased strength;Decreased range of motion;Decreased activity tolerance;Decreased balance;Decreased coordination;Decreased mobility;Decreased knowledge of use of DME;Pain          PT Treatment Interventions DME instruction;Gait training;Stair training;Functional mobility training;Therapeutic activities;Therapeutic exercise;Balance training;Neuromuscular re-education;Patient/family education    PT Goals (Current goals can be found in the Care Plan section)  Acute Rehab PT Goals Patient Stated Goal: return to PLOF without pain PT Goal Formulation: With patient Time For Goal Achievement: 02/15/16 Potential to Achieve Goals: Good    Frequency 7X/week   Barriers to discharge        Co-evaluation               End of Session Equipment Utilized During Treatment: Gait belt Activity Tolerance: Patient limited by pain;Patient limited by fatigue Patient left: in chair;with call bell/phone within reach;with SCD's reapplied Nurse Communication: Mobility status         Time: [id number] PT Time Calculation (min) (ACUTE ONLY): 21 min   Charges:   PT Evaluation $PT Eval Moderate Complexity: 1 Procedure     PT G CodesClearnce Sorrel Mava Suares 02/08/2016, 12:24 PM Sherie Don, [place], DPT [phone]

## 2016-02-08 NOTE — Op Note (Signed)
NAME:  [name], [name] NO.:  [id number]  MEDICAL RECORD NO.:  [id number]  LOCATION:  5N23C                        FACILITY:  [facility]  PHYSICIAN:  Lind Guest. Ninfa Linden, M.D.DATE OF BIRTH:  10/21/1940  DATE OF PROCEDURE:  02/07/2016 DATE OF DISCHARGE:                              OPERATIVE REPORT   PREOPERATIVE DIAGNOSIS:  Primary osteoarthritis and degenerative joint disease, right hip.  POSTOPERATIVE DIAGNOSIS:  Primary osteoarthritis and degenerative joint disease, right hip.  PROCEDURE:  Right total hip arthroplasty through direct anterior approach.  IMPLANTS:  DePuy Sector Gription acetabular component size 54, size 36+ 0 neutral polyethylene liner, size 14 Corail femoral component with standard offset, size 36+ 1.5 ceramic hip ball.  SURGEON:  Lind Guest. Ninfa Linden, M.D.  ASSISTANT:  Erskine Emery, PA-C.  ANESTHESIA:  Spinal.  ANTIBIOTICS:  2 g of IV Ancef.  BLOOD LOSS:  150-200 mL.  COMPLICATIONS:  None.  INDICATIONS:  Mr. Eric Beltran is a 75 year old gentleman with debilitating arthritis involving his right hip.  This was well documented.  His x- rays showed complete loss of the superolateral joint space, periarticular osteophytes, sclerotic changes, and other significant changes of impingement of his hip.  He has tried and failed all forms of conservative treatment.  His pain is daily, and it detrimentally affected his activities of daily living, his quality of life, and his mobility to the point that he does wish to proceed with the recommended total hip arthroplasty.  We have spent some time, explained to him the risks of acute blood loss anemia, nerve and vessel injury, fracture, infection, dislocation, DVT.  He understands our goals are decreased pain, improved mobility, and overall improved quality of life.  PROCEDURE DESCRIPTION:  After informed consent was obtained, appropriate right hip was marked.  He was brought to the operating  room, where spinal anesthesia was obtained while he was on the stretcher.  A Foley catheter was placed.  Then, both feet had traction boots applied to them.  He was placed supine on the Hana fracture table with the perineal post in place and both legs in inline skeletal traction devices, no traction applied.  His right operative hip was then prepped and draped with DuraPrep and sterile drapes.  A time-out was called to identify correct patient, correct right hip.  We then made an incision just inferior and posterior to the anterior superior iliac spine and carried this slightly obliquely down the leg.  We dissected down tensor fascia lata muscle, and tensor fascia was then identified and divided in a longitudinal format proceeding with a direct anterior approach to the hip.  We identified and cauterized the circumflex vessels and then identified the hip capsule with the hip capsule in an L-type format finding a moderate joint effusion, significant periarticular osteophytes.  We placed Cobra retractors on the medial and lateral femoral neck and then made our femoral neck cut with an oscillating saw just proximal to the lesser trochanter and completed this on osteotome.  We placed a corkscrew guide in the femoral head and removed the femoral head its entirety.  We found a large section completely devoid of cartilage.  We passed this to the back table.  I then placed a bent Hohmann over the medial acetabular rim and released the acetabular labrum and the transverse acetabular ligament.  I then began reaming under direct visualization from a size 42 reamer in stepwise increments up to a size 54 with all reamers under direct visualization, the last reamer also under direct fluoroscopy, so I could obtain my depth of reaming, my inclination and anteversion. Once I was pleased with this, I placed the real DePuy Sector Gription acetabular component size 54 and a 36+ 0 neutral polyethylene liner  for that size acetabular component.  Attention was then turned to the femur. With the leg externally rotated to 120 degrees, extended and adducted, we were to place a Mueller retractor medially and Hohmann retractor behind the greater trochanter.  I released the lateral joint capsule, used a box cutting osteotome to enter the femoral canal and a rongeur lateralizer.  I then began broaching from a size 8 broach using the Corail broaching system up to a size 14.  With the 14 in place, we trialed a standard offset femoral neck and a 36+ 1.5 trial hip ball, rolled leg back over and up with traction and internal rotation, reducing the pelvis.  We were pleased with leg length, stability offset, and range of motion.  We then dislocated the hip and removed the trial components.  We were able to place the real Corail femoral component with standard offset size 14 and the real 36+ 1.5 ceramic hip ball. Again, we reduced this acetabulum, and we were pleased with stability. We then irrigated the soft tissue with normal saline solution using pulsatile lavage.  We closed the joint capsule with interrupted #1 Ethibond suture followed by running #1 Vicryl tensor fascia, 0 Vicryl deep tissue, 2-0 Vicryl in the subcutaneous tissue, 4-0 Monocryl subcuticular stitch and Steri-Strips on the skin.  An Aquacel dressing was applied.  He was then taken off the Hana table, taken to the recovery room in stable condition.  All final counts were correct. There were no complications noted.  Of note, Erskine Emery, PA-C assisted in the entire case.  His assistance was crucial facilitating all aspects of this case.     Lind Guest. Ninfa Linden, M.D.     CYB/MEDQ  D:  02/07/2016  T:  02/08/2016  Job:  [id number]

## 2016-02-09 MED ORDER — ASPIRIN 81 MG PO CHEW: 81.0000 mg | CHEWABLE_TABLET | Freq: Two times a day (BID) | ORAL | 0 refills | Status: DC

## 2016-02-09 MED ORDER — METHOCARBAMOL 500 MG PO TABS: 500.0000 mg | ORAL_TABLET | Freq: Four times a day (QID) | ORAL | 0 refills | Status: DC | PRN

## 2016-02-09 MED ORDER — OXYCODONE-ACETAMINOPHEN 5-325 MG PO TABS: 1.0000 | ORAL_TABLET | ORAL | 0 refills | Status: DC | PRN

## 2016-02-09 NOTE — Discharge Instructions (Signed)

## 2016-02-09 NOTE — Progress Notes (Addendum)
Physical Therapy Treatment Patient Details Name: Eric Beltran MRN: [id number] DOB: [birth year] Today's Date: 02/09/2016    History of Present Illness Pt is a 75 y/o male s/p R THA (anterior approach). PMH including but not limited to HTN, aneurysm thoracic aorta (2011), hx of MI, and CABG in 2013.    PT Comments    Pt presented sitting OOB in recliner when PT entered room. Pt's daughter was present throughout session as well. Pt making good progress towards achieving his functional goals and increased distance ambulated this session. PT planning to perform stair training at next session. Pt would continue to benefit from skilled physical therapy services at this time while admitted and after d/c to address his limitations in order to improve his overall safety and independence with functional mobility.   Follow Up Recommendations  Home health PT;Supervision for mobility/OOB     Equipment Recommendations  Rolling walker with 5" wheels;3in1 (PT)    Recommendations for Other Services       Precautions / Restrictions Precautions Precautions: Fall Restrictions Weight Bearing Restrictions: Yes RLE Weight Bearing: Weight bearing as tolerated    Mobility  Bed Mobility               General bed mobility comments: pt sitting OOB in recliner when PT entered room  Transfers Overall transfer level: Needs assistance Equipment used: Rolling walker (2 wheeled) Transfers: Sit to/from Stand Sit to Stand: Min guard         General transfer comment: pt required increased time and VC'ing for bilateral hand positioning  Ambulation/Gait Ambulation/Gait assistance: Min guard Ambulation Distance (Feet): 200 Feet Assistive device: Rolling walker (2 wheeled) Gait Pattern/deviations: Step-through pattern;Decreased weight shift to right;Trunk flexed Gait velocity: decreased Gait velocity interpretation: Below normal speed for age/gender General Gait Details: pt required VC'ing to  maintain safe distance from RW throughout ambulation   Stairs            Wheelchair Mobility    Modified Rankin (Stroke Patients Only)       Balance Overall balance assessment: Needs assistance Sitting-balance support: Feet supported;No upper extremity supported Sitting balance-Leahy Scale: Fair     Standing balance support: During functional activity;Bilateral upper extremity supported Standing balance-Leahy Scale: Poor                      Cognition Arousal/Alertness: Awake/alert Behavior During Therapy: WFL for tasks assessed/performed Overall Cognitive Status: Within Functional Limits for tasks assessed                      Exercises Total Joint Exercises Quad Sets: AROM;Strengthening;Right;10 reps;Seated Straight Leg Raises: AAROM;Right;10 reps;Seated Long Arc Quad: AROM;Strengthening;Right;10 reps;Seated Marching in Standing: AAROM;Right;10 reps;Seated    General Comments        Pertinent Vitals/Pain Pain Assessment: Faces Faces Pain Scale: No hurt Pain Intervention(s): Monitored during session    Home Living                      Prior Function            PT Goals (current goals can now be found in the care plan section) Acute Rehab PT Goals Patient Stated Goal: to return home PT Goal Formulation: With patient Time For Goal Achievement: 02/15/16 Potential to Achieve Goals: Good Progress towards PT goals: Progressing toward goals    Frequency    7X/week      PT Plan Current plan remains appropriate  Co-evaluation             End of Session Equipment Utilized During Treatment: Gait belt Activity Tolerance: Patient limited by fatigue Patient left: in chair;with call bell/phone within reach;with family/visitor present     Time: [id number] PT Time Calculation (min) (ACUTE ONLY): 30 min  Charges:  $Gait Training: 8-22 mins $Therapeutic Exercise: 8-22 mins                    G Codes:      Clearnce Sorrel Alahia Whicker 02/23/2016, 10:35 AM Sherie Don, [place], DPT [phone]

## 2016-02-09 NOTE — Progress Notes (Signed)
Subjective: 2 Days Post-Op Procedure(s) (LRB): RIGHT TOTAL HIP ARTHROPLASTY ANTERIOR APPROACH (Right) Patient reports pain as moderate.    Objective: Vital signs in last 24 hours: Temp:  [98.4 F (36.9 C)-100.4 F (38 C)] 98.4 F (36.9 C) (09/28 0504) Pulse Rate:  [65-90] 70 (09/28 0504) Resp:  [19] 19 (09/28 0504) BP: (132-150)/(57-71) 140/66 (09/28 0504) SpO2:  [91 %-96 %] 93 % (09/28 0504)  Intake/Output from previous day: 09/27 0701 - 09/28 0700 In: 960 [P.O.:960] Out: 2100 [Urine:2100] Intake/Output this shift: Total I/O In: 240 [P.O.:240] Out: 1850 [Urine:1850]   Recent Labs  02/08/16 0643  HGB 12.9*    Recent Labs  02/08/16 0643  WBC 15.6*  RBC 4.64  HCT 40.7  PLT 196    Recent Labs  02/08/16 0643  NA 136  K 3.9  CL 104  CO2 25  BUN 13  CREATININE 1.06  GLUCOSE 127*  CALCIUM 8.6*   No results for input(s): LABPT, INR in the last 72 hours.  Sensation intact distally Intact pulses distally Dorsiflexion/Plantar flexion intact Incision: dressing C/D/I  Assessment/Plan: 2 Days Post-Op Procedure(s) (LRB): RIGHT TOTAL HIP ARTHROPLASTY ANTERIOR APPROACH (Right) Up with therapy Plan for discharge tomorrow Discharge home with home health  Mcarthur Rossetti 02/09/2016, 6:57 AM

## 2016-02-09 NOTE — Progress Notes (Signed)
Physical Therapy Treatment Patient Details Name: Eric Beltran MRN: [id number] DOB: 05-14-1941 Today's Date: 02/09/2016    History of Present Illness Pt is a 75 y/o male s/p R THA (anterior approach). PMH including but not limited to HTN, aneurysm thoracic aorta (2011), hx of MI, and CABG in 2013.    PT Comments    Pt presented sitting OOB in recliner when PT entered room. Pt making good progress towards achieving his functional goals and successfully completed stair training this session. Pt would continue to benefit from skilled physical therapy services at this time while admitted and after d/c to address his limitations in order to improve his overall safety and independence with functional mobility.   Follow Up Recommendations  Home health PT;Supervision for mobility/OOB     Equipment Recommendations  Rolling walker with 5" wheels;3in1 (PT)    Recommendations for Other Services       Precautions / Restrictions Precautions Precautions: Fall Restrictions Weight Bearing Restrictions: Yes RLE Weight Bearing: Weight bearing as tolerated    Mobility  Bed Mobility               General bed mobility comments: pt sitting OOB in recliner when PT entered room  Transfers Overall transfer level: Needs assistance Equipment used: Rolling walker (2 wheeled) Transfers: Sit to/from Stand Sit to Stand: Supervision         General transfer comment: pt required increased time and VC'ing for bilateral hand positioning  Ambulation/Gait Ambulation/Gait assistance: Min guard Ambulation Distance (Feet): 50 Feet Assistive device: Rolling walker (2 wheeled) Gait Pattern/deviations: Step-through pattern;Decreased weight shift to right;Trunk flexed Gait velocity: decreased Gait velocity interpretation: Below normal speed for age/gender General Gait Details: pt required VC'ing to maintain safe distance from RW throughout ambulation   Stairs Stairs: Yes Stairs assistance: Min  guard Stair Management: Two rails;Step to pattern;Forwards Number of Stairs: 2 (x2 bouts) General stair comments: pt ascended with L LE leading and descended with R LE leading with VC'ing for technique  Wheelchair Mobility    Modified Rankin (Stroke Patients Only)       Balance Overall balance assessment: Needs assistance Sitting-balance support: Feet supported;No upper extremity supported Sitting balance-Leahy Scale: Good     Standing balance support: During functional activity;Single extremity supported Standing balance-Leahy Scale: Poor                      Cognition Arousal/Alertness: Awake/alert Behavior During Therapy: WFL for tasks assessed/performed Overall Cognitive Status: Within Functional Limits for tasks assessed                      Exercises Total Joint Exercises Quad Sets: AROM;Strengthening;Right;10 reps;Seated Heel Slides: AAROM;Right;10 reps;Seated Hip ABduction/ADduction: AROM;Strengthening;Right;10 reps;Standing Straight Leg Raises: AAROM;Right;10 reps;Seated Long Arc Quad: AROM;Strengthening;Right;10 reps;Seated Marching in Standing: AROM;Strengthening;Right;10 reps Standing Hip Extension: AROM;Strengthening;Right;10 reps    General Comments        Pertinent Vitals/Pain Pain Assessment: No/denies pain Faces Pain Scale: No hurt Pain Intervention(s): Monitored during session    Home Living                      Prior Function            PT Goals (current goals can now be found in the care plan section) Acute Rehab PT Goals Patient Stated Goal: to return home PT Goal Formulation: With patient Time For Goal Achievement: 02/15/16 Potential to Achieve Goals: Good Progress towards PT goals: Progressing  toward goals    Frequency    7X/week      PT Plan Current plan remains appropriate    Co-evaluation             End of Session Equipment Utilized During Treatment: Gait belt Activity Tolerance:  Patient tolerated treatment well Patient left: in chair;with call bell/phone within reach;Other (comment) (with OT present in therapy gym to begin session)     Time: 1140-1158 PT Time Calculation (min) (ACUTE ONLY): 18 min  Charges:  $Gait Training: 8-22 mins                     G CodesClearnce Sorrel Temitayo Covalt 02/15/2016, 12:34 PM Sherie Don, [place], DPT [phone]

## 2016-02-09 NOTE — Progress Notes (Addendum)
Occupational Therapy Treatment/Discharge Patient Details Name: Eric Beltran MRN: [id number] DOB: 1941-01-28 Today's Date: 02/09/2016    History of present illness Pt is a 75 y/o male s/p R THA (anterior approach). PMH including but not limited to HTN, aneurysm thoracic aorta (2011), hx of MI, and CABG in 2013.   OT comments  Pt ambulating with PT upon OT arrival. Pt educated concerning shower transfer with RW and 3-in-1 requiring min guard assist and cues for sequencing when stepping into shower and supervision with cues for sequencing when stepping out. Pt is improving with simulated toilet transfer to [facility] requiring supervision. Pt is improving with LB dressing tasks requiring min assist this date using AE (reacher). Education completed concerning safe use of DME and precautions during ADL and pt demonstrates understanding. No further acute OT needs. Further OT needs can be deferred to subsequent venue/home health Ot. OT signing off.   Follow Up Recommendations  Home health OT;Supervision/Assistance - 24 hour    Equipment Recommendations  3 in 1 bedside comode    Recommendations for Other Services      Precautions / Restrictions Precautions Precautions: Fall Restrictions Weight Bearing Restrictions: Yes RLE Weight Bearing: Weight bearing as tolerated       Mobility Bed Mobility               General bed mobility comments: Pt ambulating with PT upon OT arrival.  Transfers Overall transfer level: Needs assistance Equipment used: Rolling walker (2 wheeled) Transfers: Sit to/from Stand Sit to Stand: Supervision;Min guard (Supervision for toilet, min guard for shower)         General transfer comment: VC's for safe hand placement    Balance Overall balance assessment: Needs assistance Sitting-balance support: Feet supported;No upper extremity supported Sitting balance-Leahy Scale: Good     Standing balance support: Single extremity supported;During functional  activity Standing balance-Leahy Scale: Poor                     ADL Overall ADL's : Needs assistance/impaired                     Lower Body Dressing: Minimal assistance;Sit to/from stand Lower Body Dressing Details (indicate cue type and reason): Educated pt concerning use of reacher to assist with LB dressing. Pt has a Secondary school teacher at home. Toilet Transfer: Supervision/safety;BSC;RW;Ambulation       Tub/ Banker: Min guard;3 in 1;Ambulation;Rolling walker;Supervision/safety (Min guard stepping in, supervision stepping out)   Functional mobility during ADLs: Minimal assistance (Min assist for LB dressing; min guard for functional trans) General ADL Comments: Pt educated on safe shower transfer with RW. Demonstrated understanding.      Vision                     Perception     Praxis      Cognition   Behavior During Therapy: [facility] for tasks assessed/performed Overall Cognitive Status: Within Functional Limits for tasks assessed                       Extremity/Trunk Assessment               Exercises Total Joint Exercises Quad Sets: AROM;Strengthening;Right;10 reps;Seated Heel Slides: AAROM;Right;10 reps;Seated Hip ABduction/ADduction: AROM;Strengthening;Right;10 reps;Standing Straight Leg Raises: AAROM;Right;10 reps;Seated Long Arc Quad: AROM;Strengthening;Right;10 reps;Seated Marching in Standing: AROM;Strengthening;Right;10 reps Standing Hip Extension: AROM;Strengthening;Right;10 reps   Shoulder Instructions       General Comments  Pertinent Vitals/ Pain       Pain Assessment: 0-10 Pain Score: 7  Faces Pain Scale: No hurt Pain Descriptors / Indicators: Operative site guarding;Discomfort;Sore Pain Intervention(s): Monitored during session;Repositioned  Home Living                                          Prior Functioning/Environment              Frequency  Min 2X/week         Progress Toward Goals  OT Goals(current goals can now be found in the care plan section)  Progress towards OT goals: Progressing toward goals  Acute Rehab OT Goals Patient Stated Goal: to return home OT Goal Formulation: With patient Time For Goal Achievement: 02/22/16 Potential to Achieve Goals: Good ADL Goals Pt Will Perform Lower Body Bathing: with min guard assist;sit to/from stand Pt Will Perform Lower Body Dressing: with min guard assist;sit to/from stand Pt Will Transfer to Toilet: with supervision;regular height toilet;grab bars Pt Will Perform Tub/Shower Transfer: Tub transfer;3 in 1;rolling walker;with supervision  Plan Discharge plan remains appropriate    Co-evaluation                 End of Session Equipment Utilized During Treatment: Gait belt;Rolling walker   Activity Tolerance Patient tolerated treatment well   Patient Left in chair;with call bell/phone within reach   Nurse Communication  (OT complete)        Time: [id number] OT Time Calculation (min): 22 min  Charges: OT General Charges $OT Visit: 1 Procedure OT Treatments $Self Care/Home Management : 8-22 mins  Norman Herrlich, OTR/L [phone] 02/09/2016, 12:53 PM

## 2016-02-10 NOTE — Progress Notes (Signed)
Pt reported a change at his incision site. He discribed it was more swelling and boldging that he noticed  this morning. He suspect a potential infection. I notified the on call MD from answering service. Waiting for hearing from MD. Will continue to monitor pt closely.

## 2016-02-10 NOTE — Discharge Summary (Signed)
Patient ID: AADAN ZEMPEL MRN: [id number] DOB/AGE: 1940/05/22 75 y.o.  Admit date: 02/07/2016 Discharge date: 02/10/2016  Admission Diagnoses:  Principal Problem:   Osteoarthritis of right hip Active Problems:   Status post total replacement of right hip   Discharge Diagnoses:  Same  Past Medical History:  Diagnosis Date  . Ascending aortic aneurysm [facility])    Status post repair 12/2011 at [facility]  . Atrial fibrillation (HCC)    Perioperative  . BPH (benign prostatic hyperplasia)   . Coronary atherosclerosis of native coronary artery    Multivessel status post prior stenting and ultimately CABG 12/2011 at [facility]  . Essential hypertension, benign   . Mixed hyperlipidemia   . Myocardial infarction ([place])   . Supraventricular tachycardia ([place])     Surgeries: Procedure(s): RIGHT TOTAL HIP ARTHROPLASTY ANTERIOR APPROACH on 02/07/2016   Consultants:   Discharged Condition: Improved  Hospital Course: PHINEAS CURTIS is an 75 y.o. male who was admitted 02/07/2016 for operative treatment ofOsteoarthritis of right hip. Patient has severe unremitting pain that affects sleep, daily activities, and work/hobbies. After pre-op clearance the patient was taken to the operating room on 02/07/2016 and underwent  Procedure(s): RIGHT TOTAL HIP ARTHROPLASTY ANTERIOR APPROACH.    Patient was given perioperative antibiotics: Anti-infectives    Start     Dose/Rate Route Frequency Ordered Stop   02/07/16 1830  ceFAZolin (ANCEF) IVPB 1 g/50 mL premix     1 g 100 mL/hr over 30 Minutes Intravenous Every 6 hours 02/07/16 1721 02/08/16 0305   02/07/16 0926  ceFAZolin (ANCEF) IVPB 2g/100 mL premix     2 g 200 mL/hr over 30 Minutes Intravenous On call to O.R. 02/07/16 0926 02/07/16 1300       Patient was given sequential compression devices, early ambulation, and chemoprophylaxis to prevent DVT.  Patient benefited maximally from hospital stay and there were no complications.    Recent vital signs:  Patient Vitals for the past 24 hrs:  BP Temp Temp src Pulse Resp SpO2  02/10/16 0408 135/70 98.3 F (36.8 C) Oral 73 16 94 %  02/09/16 2002 136/63 98.3 F (36.8 C) Oral 75 16 95 %  02/09/16 1400 114/65 98.6 F (37 C) Oral 65 20 93 %     Recent laboratory studies:  Recent Labs  02/08/16 0643  WBC 15.6*  HGB 12.9*  HCT 40.7  PLT 196  NA 136  K 3.9  CL 104  CO2 25  BUN 13  CREATININE 1.06  GLUCOSE 127*  CALCIUM 8.6*     Discharge Medications:     Medication List    STOP taking these medications   aspirin EC 81 MG tablet Replaced by:  aspirin 81 MG chewable tablet   naproxen 500 MG tablet Commonly known as:  NAPROSYN     TAKE these medications   aspirin 81 MG chewable tablet Chew 1 tablet (81 mg total) by mouth 2 (two) times daily. Replaces:  aspirin EC 81 MG tablet   atorvastatin 40 MG tablet Commonly known as:  LIPITOR TAKE 1 TABLET BY MOUTH AT BEDTIME   CIALIS 5 MG tablet Generic drug:  tadalafil Take 5 mg by mouth as needed for erectile dysfunction.   diltiazem 240 MG 24 hr capsule Commonly known as:  CARDIZEM CD TAKE 1 CAPSULE BY MOUTH EVERY DAY   Fish Oil 1000 MG Caps Take 1 capsule by mouth daily.   FLOMAX 0.4 MG Caps capsule Generic drug:  tamsulosin Take 0.4 mg by mouth daily.  gabapentin 300 MG capsule Commonly known as:  NEURONTIN TAKE 1 CAPSULE BY MOUTH THREE TIMES DAILY AS DIRECTED   methocarbamol 500 MG tablet Commonly known as:  ROBAXIN Take 1 tablet (500 mg total) by mouth every 6 (six) hours as needed for muscle spasms.   metoprolol succinate 50 MG 24 hr tablet Commonly known as:  TOPROL-XL Take 1 tablet (50 mg total) by mouth daily.   multivitamin tablet Take 1 tablet by mouth at bedtime.   nitroGLYCERIN 0.4 MG SL tablet Commonly known as:  NITROSTAT Place 1 tablet (0.4 mg total) under the tongue every 5 (five) minutes as needed for chest pain.   oxyCODONE-acetaminophen 5-325 MG tablet Commonly known as:   ROXICET Take 1-2 tablets by mouth every 4 (four) hours as needed.       Diagnostic Studies: Dg C-arm 1-60 Min  Result Date: 02/07/2016 CLINICAL DATA:  Patient for right hip arthroplasty. EXAM: OPERATIVE RIGHT HIP (WITH PELVIS IF PERFORMED) 3 VIEWS TECHNIQUE: Fluoroscopic spot image(s) were submitted for interpretation post-operatively. COMPARISON:  Right hip radiograph 10/18/2015 FINDINGS: Patient status post right hip arthroplasty. Hardware appears in appropriate position. No acute osseous abnormality. IMPRESSION: Patient status post right hip arthroplasty. Electronically Signed   By: Lovey Newcomer M.D.   On: 02/07/2016 14:22   Dg Hip Port Unilat With Pelvis 1v Right  Result Date: 02/07/2016 CLINICAL DATA:  Status post right total hip arthroplasty. EXAM: DG HIP (WITH OR WITHOUT PELVIS) 1V PORT RIGHT COMPARISON:  Radiographs of October 18, 2015. FINDINGS: The femoral and acetabular components appear to be well situated. No fracture or dislocation is noted. Vascular calcifications are noted. IMPRESSION: Status post right total hip arthroplasty. Electronically Signed   By: Marijo Conception, M.D.   On: 02/07/2016 15:35   Dg Hip Operative Unilat W Or W/o Pelvis Right  Result Date: 02/07/2016 CLINICAL DATA:  Patient for right hip arthroplasty. EXAM: OPERATIVE RIGHT HIP (WITH PELVIS IF PERFORMED) 3 VIEWS TECHNIQUE: Fluoroscopic spot image(s) were submitted for interpretation post-operatively. COMPARISON:  Right hip radiograph 10/18/2015 FINDINGS: Patient status post right hip arthroplasty. Hardware appears in appropriate position. No acute osseous abnormality. IMPRESSION: Patient status post right hip arthroplasty. Electronically Signed   By: Lovey Newcomer M.D.   On: 02/07/2016 14:22    Disposition: 01-Home or Self Care  Discharge Instructions    Call MD / Call 911    Complete by:  As directed    If you experience chest pain or shortness of breath, CALL 911 and be transported to the hospital emergency room.   If you develope a fever above 101 F, pus (white drainage) or increased drainage or redness at the wound, or calf pain, call your surgeon's office.   Constipation Prevention    Complete by:  As directed    Drink plenty of fluids.  Prune juice may be helpful.  You may use a stool softener, such as Colace (over the counter) 100 mg twice a day.  Use MiraLax (over the counter) for constipation as needed.   Diet - low sodium heart healthy    Complete by:  As directed    Discharge patient    Complete by:  As directed    Increase activity slowly as tolerated    Complete by:  As directed       Follow-up [place] .   Why:  Someone from [place] at [employer]) will contact you to arrange start date and time for therapy. Contact information:  [address] [place] [address] [phone]        Mcarthur Rossetti, MD Follow up in 2 week(s).   Specialty:  Orthopedic Surgery Contact information: [place] [address] [phone]            Signed: Mcarthur Rossetti 02/10/2016, 7:00 AM

## 2016-02-10 NOTE — Progress Notes (Signed)
Patient ID: Eric Beltran, male   DOB: 06/23/1940, 75 y.o.   MRN: [id number] Doing well.  Can be discharged to home today.

## 2016-02-10 NOTE — Progress Notes (Signed)
Physical Therapy Treatment Patient Details Name: Eric Beltran MRN: [id number] DOB: 1941/04/06 Today's Date: 02/10/2016    History of Present Illness Pt is a 75 y/o male s/p R THA (anterior approach). PMH including but not limited to HTN, aneurysm thoracic aorta (2011), hx of MI, and CABG in 2013.    PT Comments    Pt presented supine in bed with HOB elevated, awake and willing to participate in therapy session. Pt making great progress and again participate in stair training this session. Pt would continue to benefit from skilled physical therapy services at this time while admitted and after d/c to address his limitations in order to improve his overall safety and independence with functional mobility.   Follow Up Recommendations  Home health PT;Supervision for mobility/OOB     Equipment Recommendations  Rolling walker with 5" wheels;3in1 (PT)    Recommendations for Other Services       Precautions / Restrictions Precautions Precautions: Fall Restrictions Weight Bearing Restrictions: Yes RLE Weight Bearing: Weight bearing as tolerated    Mobility  Bed Mobility Overal bed mobility: Needs Assistance Bed Mobility: Supine to Sit     Supine to sit: Min assist;HOB elevated     General bed mobility comments: Pt required increased time and min A with movement of R LE  Transfers Overall transfer level: Needs assistance Equipment used: Rolling walker (2 wheeled) Transfers: Sit to/from Stand Sit to Stand: Supervision         General transfer comment: VC's for safe hand placement  Ambulation/Gait Ambulation/Gait assistance: Min guard Ambulation Distance (Feet): 200 Feet Assistive device: Rolling walker (2 wheeled) Gait Pattern/deviations: Step-through pattern;Decreased stride length;Trunk flexed Gait velocity: decreased Gait velocity interpretation: Below normal speed for age/gender General Gait Details: pt demonstrating safe technique   Stairs Stairs:  Yes Stairs assistance: Min guard Stair Management: Two rails;Forwards;Step to pattern Number of Stairs: 2 General stair comments: pt ascended with L LE leading and descended with R LE leading with VC'ing for technique  Wheelchair Mobility    Modified Rankin (Stroke Patients Only)       Balance Overall balance assessment: Needs assistance Sitting-balance support: Feet supported;No upper extremity supported Sitting balance-Leahy Scale: Good     Standing balance support: During functional activity;No upper extremity supported Standing balance-Leahy Scale: Fair                      Cognition Arousal/Alertness: Awake/alert Behavior During Therapy: WFL for tasks assessed/performed Overall Cognitive Status: Within Functional Limits for tasks assessed                      Exercises      General Comments        Pertinent Vitals/Pain Pain Assessment: Faces Faces Pain Scale: Hurts little more Pain Location: R hip Pain Descriptors / Indicators: Grimacing;Guarding Pain Intervention(s): Monitored during session;Repositioned    Home Living                      Prior Function            PT Goals (current goals can now be found in the care plan section) Acute Rehab PT Goals Patient Stated Goal: to return home PT Goal Formulation: With patient Time For Goal Achievement: 02/15/16 Potential to Achieve Goals: Good Progress towards PT goals: Progressing toward goals    Frequency    7X/week      PT Plan Current plan remains appropriate  Co-evaluation             End of Session Equipment Utilized During Treatment: Gait belt Activity Tolerance: Patient tolerated treatment well Patient left: in chair;with call bell/phone within reach     Time: 0926-0950 PT Time Calculation (min) (ACUTE ONLY): 24 min  Charges:  $Gait Training: 23-37 mins                    G CodesClearnce Sorrel Nylani Michetti 03-08-16, 9:54 AM Sherie Don, PT,  DPT [phone]

## 2016-02-10 NOTE — Care Management Important Message (Signed)
Important Message  Patient Details  Name: Eric Beltran MRN: [id number] Date of Birth: 09-15-1940   Medicare Important Message Given:       Orbie Pyo 02/10/2016, 11:44 AM

## 2016-02-11 DIAGNOSIS — I10 Essential (primary) hypertension: Secondary | ICD-10-CM | POA: Diagnosis not present

## 2016-02-11 DIAGNOSIS — I252 Old myocardial infarction: Secondary | ICD-10-CM | POA: Diagnosis not present

## 2016-02-11 DIAGNOSIS — Z951 Presence of aortocoronary bypass graft: Secondary | ICD-10-CM | POA: Diagnosis not present

## 2016-02-11 DIAGNOSIS — I2510 Atherosclerotic heart disease of native coronary artery without angina pectoris: Secondary | ICD-10-CM | POA: Diagnosis not present

## 2016-02-11 DIAGNOSIS — Z471 Aftercare following joint replacement surgery: Secondary | ICD-10-CM | POA: Diagnosis not present

## 2016-02-11 DIAGNOSIS — N400 Enlarged prostate without lower urinary tract symptoms: Secondary | ICD-10-CM | POA: Diagnosis not present

## 2016-02-11 DIAGNOSIS — I4891 Unspecified atrial fibrillation: Secondary | ICD-10-CM | POA: Diagnosis not present

## 2016-02-11 DIAGNOSIS — Z7982 Long term (current) use of aspirin: Secondary | ICD-10-CM | POA: Diagnosis not present

## 2016-02-11 DIAGNOSIS — Z96641 Presence of right artificial hip joint: Secondary | ICD-10-CM | POA: Diagnosis not present

## 2016-02-22 ENCOUNTER — Inpatient Hospital Stay (INDEPENDENT_AMBULATORY_CARE_PROVIDER_SITE_OTHER): Payer: Medicare Other | Admitting: Orthopaedic Surgery

## 2016-02-22 DIAGNOSIS — M25551 Pain in right hip: Secondary | ICD-10-CM

## 2016-02-22 DIAGNOSIS — M1611 Unilateral primary osteoarthritis, right hip: Secondary | ICD-10-CM

## 2016-03-14 ENCOUNTER — Ambulatory Visit (INDEPENDENT_AMBULATORY_CARE_PROVIDER_SITE_OTHER): Payer: Medicare Other | Admitting: Orthopaedic Surgery

## 2016-03-14 DIAGNOSIS — Z96641 Presence of right artificial hip joint: Secondary | ICD-10-CM

## 2016-03-14 NOTE — Progress Notes (Signed)
Eric Beltran is now 6 weeks status post right total hip arthroplasty. He has no issues at all. He does have a little bit of numbness around his incision but overall he is driving and getting around well. He says he feels much better than he did preoperatively.  Examination of his right hip incision shows it is healed well. He has just some slight swelling around his incision. I did try to drain a seroma but only got about 3-5 mL of fluid. His leg lengths are equal. He is walking without a limp.  I'll see him back in 4 weeks to see [name] doing overall. She looks good at that point we will see him back to 1 year postoperative.

## 2016-04-11 ENCOUNTER — Ambulatory Visit (INDEPENDENT_AMBULATORY_CARE_PROVIDER_SITE_OTHER): Payer: Medicare Other | Admitting: Orthopaedic Surgery

## 2016-04-18 ENCOUNTER — Encounter (INDEPENDENT_AMBULATORY_CARE_PROVIDER_SITE_OTHER): Payer: Self-pay | Admitting: Orthopaedic Surgery

## 2016-04-18 ENCOUNTER — Ambulatory Visit (INDEPENDENT_AMBULATORY_CARE_PROVIDER_SITE_OTHER): Payer: Medicare Other | Admitting: Physician Assistant

## 2016-04-18 VITALS — Ht 67.0 in | Wt 188.0 lb

## 2016-04-18 DIAGNOSIS — Z96641 Presence of right artificial hip joint: Secondary | ICD-10-CM

## 2016-04-18 NOTE — Progress Notes (Signed)
Office Visit Note   Patient: Eric Beltran           Date of Birth: [birth year]           MRN: [id number] Visit Date: 04/18/2016              Requested by: Sinda Du, MD [address], [place] [address] PCP: Alonza Bogus, MD   Assessment & Plan: Visit Diagnoses: No diagnosis found.  Plan: He'll continue work on range of motion strengthening of the right hip. See him back in 1 year had that time AP pelvis and lateral view of the right hip sooner if there is any questions or concerns  Follow-Up Instructions: Return in about 9 months (around 01/17/2017).   Orders:  No orders of the defined types were placed in this encounter.  No orders of the defined types were placed in this encounter.     Procedures: No procedures performed   Clinical Data: No additional findings.   Subjective: Chief Complaint  Patient presents with  . Right Hip - Follow-up, Pain    Prior returns today status post right total hip arthroplasty 02/07/2016. Overall doing well very pleased with the results. He has no concerns Garza hip    Review of Systems   Objective: Vital Signs: Ht 5\' 7"  (1.702 m)   Wt 188 lb (85.3 kg)   BMI 29.44 kg/m   Physical Exam  Constitutional: He is oriented to person, place, and time. He appears well-developed and well-nourished.  Neurological: He is alert and oriented to person, place, and time.    Ortho Exam Good range of motion of the right hip without pain. No tenderness over the trochanteric region. Right calf supple nontender . Ambulates without any assistive device. Specialty Comments:  No specialty comments available.  Imaging: No results found.   PMFS History: Patient Active Problem List   Diagnosis Date Noted  . Osteoarthritis of right hip 02/07/2016  . Status post total replacement of right hip 02/07/2016  . Abnormal cardiovascular function study   . Abnormal myocardial perfusion study 06/09/2014  . Cardiac  murmur 05/21/2014  . Aneurysm of thoracic aorta ([place]) 04/13/2010  . Mixed hyperlipidemia 07/20/2009  . Essential hypertension, benign 07/20/2009  . Coronary atherosclerosis of native coronary artery 07/20/2009   Past Medical History:  Diagnosis Date  . Ascending aortic aneurysm [facility])    Status post repair 12/2011 at [facility]  . Atrial fibrillation (HCC)    Perioperative  . BPH (benign prostatic hyperplasia)   . Coronary atherosclerosis of native coronary artery    Multivessel status post prior stenting and ultimately CABG 12/2011 at [facility]  . Essential hypertension, benign   . Mixed hyperlipidemia   . Myocardial infarction   . Supraventricular tachycardia ([place])     No family history on file.  Past Surgical History:  Procedure Laterality Date  . ASCENDING AORTIC ANEURYSM REPAIR  12/2011 [facility]   26 mm.Dacron graft  . CORONARY ARTERY BYPASS GRAFT  12/2011 [facility]   LIMA to LAD, SVG to PLB, SVG to ramus, SVG to OM  . HERNIA REPAIR    . LEFT HEART CATHETERIZATION WITH CORONARY ANGIOGRAM N/A 06/18/2014   Procedure: LEFT HEART CATHETERIZATION WITH CORONARY ANGIOGRAM;  Surgeon: Blane Ohara, MD;  Location: [facility] CATH LAB;  Service: Cardiovascular;  Laterality: N/A;  . TOTAL HIP ARTHROPLASTY Right 02/07/2016   Procedure: RIGHT TOTAL HIP ARTHROPLASTY ANTERIOR APPROACH;  Surgeon: Mcarthur Rossetti, MD;  Location: [place];  Service: Orthopedics;  Laterality: Right;  . TRANSURETHRAL RESECTION OF PROSTATE     Social History   Occupational History  . Not on file.   Social History Main Topics  . Smoking status: Never Smoker  . Smokeless tobacco: Never Used     Comment: tobacco use - no  . Alcohol use 0.0 oz/week     Comment: Occasional  . Drug use: No  . Sexual activity: Not on file

## 2016-04-27 ENCOUNTER — Telehealth: Payer: Self-pay | Admitting: Orthopaedic Surgery

## 2016-04-27 ENCOUNTER — Other Ambulatory Visit: Payer: Self-pay | Admitting: Cardiology

## 2016-05-02 DIAGNOSIS — N401 Enlarged prostate with lower urinary tract symptoms: Secondary | ICD-10-CM | POA: Diagnosis not present

## 2016-05-02 DIAGNOSIS — N2889 Other specified disorders of kidney and ureter: Secondary | ICD-10-CM | POA: Diagnosis not present

## 2016-05-02 DIAGNOSIS — N3289 Other specified disorders of bladder: Secondary | ICD-10-CM | POA: Diagnosis not present

## 2016-05-02 DIAGNOSIS — Z96641 Presence of right artificial hip joint: Secondary | ICD-10-CM | POA: Diagnosis not present

## 2016-05-02 DIAGNOSIS — R32 Unspecified urinary incontinence: Secondary | ICD-10-CM | POA: Diagnosis not present

## 2016-05-02 DIAGNOSIS — R310 Gross hematuria: Secondary | ICD-10-CM | POA: Diagnosis not present

## 2016-05-02 DIAGNOSIS — Z792 Long term (current) use of antibiotics: Secondary | ICD-10-CM | POA: Diagnosis not present

## 2016-05-02 DIAGNOSIS — Z7982 Long term (current) use of aspirin: Secondary | ICD-10-CM | POA: Diagnosis not present

## 2016-05-24 DIAGNOSIS — Z85828 Personal history of other malignant neoplasm of skin: Secondary | ICD-10-CM | POA: Diagnosis not present

## 2016-05-24 DIAGNOSIS — B001 Herpesviral vesicular dermatitis: Secondary | ICD-10-CM | POA: Diagnosis not present

## 2016-05-24 DIAGNOSIS — L814 Other melanin hyperpigmentation: Secondary | ICD-10-CM | POA: Diagnosis not present

## 2016-05-24 DIAGNOSIS — L820 Inflamed seborrheic keratosis: Secondary | ICD-10-CM | POA: Diagnosis not present

## 2016-05-24 DIAGNOSIS — L570 Actinic keratosis: Secondary | ICD-10-CM | POA: Diagnosis not present

## 2016-05-28 ENCOUNTER — Telehealth (INDEPENDENT_AMBULATORY_CARE_PROVIDER_SITE_OTHER): Payer: Self-pay | Admitting: Orthopaedic Surgery

## 2016-05-28 NOTE — Telephone Encounter (Signed)
Not sure what they want at this point

## 2016-05-28 NOTE — Telephone Encounter (Signed)
Please advise 

## 2016-05-29 NOTE — Telephone Encounter (Signed)
LMOM of message from [facility]

## 2016-05-29 NOTE — Telephone Encounter (Signed)
Faxed

## 2016-05-29 NOTE — Telephone Encounter (Signed)
They are needing plan of care for 9/30. [phone]

## 2016-06-18 DIAGNOSIS — R319 Hematuria, unspecified: Secondary | ICD-10-CM | POA: Diagnosis not present

## 2016-06-18 DIAGNOSIS — K5730 Diverticulosis of large intestine without perforation or abscess without bleeding: Secondary | ICD-10-CM | POA: Diagnosis not present

## 2016-06-18 DIAGNOSIS — N2889 Other specified disorders of kidney and ureter: Secondary | ICD-10-CM | POA: Diagnosis not present

## 2016-06-18 DIAGNOSIS — R310 Gross hematuria: Secondary | ICD-10-CM | POA: Diagnosis not present

## 2016-06-18 DIAGNOSIS — N200 Calculus of kidney: Secondary | ICD-10-CM | POA: Diagnosis not present

## 2016-06-21 DIAGNOSIS — D485 Neoplasm of uncertain behavior of skin: Secondary | ICD-10-CM | POA: Diagnosis not present

## 2016-06-21 DIAGNOSIS — C4402 Squamous cell carcinoma of skin of lip: Secondary | ICD-10-CM | POA: Diagnosis not present

## 2016-06-21 NOTE — Progress Notes (Signed)
Cardiology Office Note  Date: 06/22/2016   ID: MOZELL ROSENBOOM, DOB 1941/03/04, MRN [id number]  PCP: Alonza Bogus, MD  Primary Cardiologist: Rozann Lesches, MD   Chief Complaint  Patient presents with  . Coronary Artery Disease    History of Present Illness: Eric Beltran is a 76 y.o. male last seen in June 2017. He presents for a routine follow-up visit. Reports no angina symptoms or change in stamina since last evaluation. No major palpitations either. He has had some intermittent leg swelling, usually notices this when he is up on his feet a lot. Last echocardiogram from 2016 is reviewed below.  Cardiac catheterization done in February 2016 demonstrated multivessel disease with patent RCA stent sites, occluded circumflex, and severely diseased proximal LAD. LIMA to LAD was patent, and 2 vein grafts were occluded. There was extensive collateralization of the circumflex from the RCA and LIMA to LAD. Medical therapy has been recommended.  We went over his cardiac medications. He continues on aspirin, Lipitor, Cardizem CD, Toprol-XL, and omega-3 supplements. Has nitroglycerin available but has not used it.  Past Medical History:  Diagnosis Date  . Ascending aortic aneurysm [facility])    Status post repair 12/2011 at [facility]  . Atrial fibrillation (HCC)    Perioperative  . BPH (benign prostatic hyperplasia)   . Coronary atherosclerosis of native coronary artery    Multivessel status post prior stenting and ultimately CABG 12/2011 at [facility]  . Essential hypertension, benign   . Mixed hyperlipidemia   . Myocardial infarction   . Supraventricular tachycardia [facility])     Past Surgical History:  Procedure Laterality Date  . ASCENDING AORTIC ANEURYSM REPAIR  12/2011 [facility]   26 mm.Dacron graft  . CORONARY ARTERY BYPASS GRAFT  12/2011 [facility]   LIMA to LAD, SVG to PLB, SVG to ramus, SVG to OM  . HERNIA REPAIR    . LEFT HEART CATHETERIZATION WITH CORONARY ANGIOGRAM N/A 06/18/2014   Procedure: LEFT HEART CATHETERIZATION WITH CORONARY ANGIOGRAM;  Surgeon: Blane Ohara, MD;  Location: [facility] CATH LAB;  Service: Cardiovascular;  Laterality: N/A;  . TOTAL HIP ARTHROPLASTY Right 02/07/2016   Procedure: RIGHT TOTAL HIP ARTHROPLASTY ANTERIOR APPROACH;  Surgeon: Mcarthur Rossetti, MD;  Location: [place];  Service: Orthopedics;  Laterality: Right;  . TRANSURETHRAL RESECTION OF PROSTATE      Current Outpatient Prescriptions  Medication Sig Dispense Refill  . aspirin EC 81 MG tablet Take 81 mg by mouth daily.    [name] atorvastatin (LIPITOR) 40 MG tablet TAKE 1 TABLET BY MOUTH AT BEDTIME 90 tablet 3  . diltiazem (CARDIZEM CD) 240 MG 24 hr capsule TAKE 1 CAPSULE BY MOUTH EVERY DAY 30 capsule 1  . gabapentin (NEURONTIN) 300 MG capsule TAKE 1 CAPSULE BY MOUTH THREE TIMES DAILY AS DIRECTED (Patient taking differently: TAKE 1 CAPSULE BY MOUTH prn) 90 capsule 2  . metoprolol succinate (TOPROL-XL) 50 MG 24 hr tablet Take 1 tablet (50 mg total) by mouth daily. 30 tablet 6  . Multiple Vitamin (MULTIVITAMIN) tablet Take 1 tablet by mouth at bedtime.     . nitroGLYCERIN (NITROSTAT) 0.4 MG SL tablet Place 1 tablet (0.4 mg total) under the tongue every 5 (five) minutes as needed for chest pain. 90 tablet 3  . Omega-3 Fatty Acids (FISH OIL) 1000 MG CAPS Take 1 capsule by mouth daily.      . tadalafil (CIALIS) 5 MG tablet Take 5 mg by mouth as needed for erectile dysfunction.    . Tamsulosin  HCl (FLOMAX) 0.4 MG CAPS Take 0.4 mg by mouth daily.     . furosemide (LASIX) 20 MG tablet Take once daily as needed for leg swelling 90 tablet 0   No current facility-administered medications for this visit.    Allergies:  Patient has no known allergies.   Social History: The patient  reports that he has never smoked. He has never used smokeless tobacco. He reports that he drinks alcohol. He reports that he does not use drugs.   ROS:  Please see the history of present illness. Otherwise, complete review of  systems is positive for none.  All other systems are reviewed and negative.   Physical Exam: VS:  BP 126/72   Pulse 61   Ht 5\' 7"  (1.702 m)   Wt 188 lb (85.3 kg)   SpO2 95%   BMI 29.44 kg/m , BMI Body mass index is 29.44 kg/m.  Wt Readings from Last 3 Encounters:  06/22/16 188 lb (85.3 kg)  04/18/16 188 lb (85.3 kg)  02/07/16 192 lb (87.1 kg)    General: Patient appears comfortable at rest. HEENT: Conjunctiva and lids normal, oropharynx clear with moist mucosa. Neck: Supple, no elevated JVP or carotid bruits, no thyromegaly. Lungs: Clear to auscultation, nonlabored breathing at rest. Cardiac: Regular rate and rhythm, no S3. 2/6 diasotlic murmur, no pericardial rub. Abdomen: Soft, nontender, bowel sounds present, no guarding or rebound. Extremities: 1+ lower leg edema, distal pulses 2+. Skin: Warm and dry. Musculoskeletal: No kyphosis. Neuropsychiatric: Alert and oriented x3, affect grossly appropriate.  ECG: I personally reviewed the tracing from 10/26/2015 which showed sinus bradycardia with prolonged PR interval and low voltage, old inferior infarct pattern.  Recent Labwork: 02/08/2016: BUN 13; Creatinine, Ser 1.06; Hemoglobin 12.9; Platelets 196; Potassium 3.9; Sodium 136  November 2016: Cholesterol 106, triglycerides 152, HDL 31, LDL 43   Other Studies Reviewed Today:  Cardiac catheterization 06/18/2014: Left mainstem: Patent vessel, no significant stenosis  Left anterior descending (LAD): 80% proximal stenosis. The diagonals and septals are patent. The mid-LAD fills from the LIMA  Left circumflex (LCx): Total occlusion at the ostium. Ostial ramus stent also occluded.  LIMA-LAD: widely patent without stenosis. Fills the mid/distal LAD and supplies a collateral to the intermediate branch  All SVG's are occluded at the aorta.  Right coronary artery (RCA): Large dominant vessel, heavily calcified. There is diffuse 40% in-stent restenosis through the mid vessel. The  distal vessel has 50% stenosis before the bifurcation of the PDA and PLA. The branch vessels of the RCA are widely patnet and there is a well formed collateral to the left circumflex.  Left ventriculography: Left ventricular systolic function is normal, LVEF is estimated at 55%, there is no significant mitral regurgitation   Aortic root angio: the aortic sinuses are dilated and there is moderate AI. The ascending aorta also appears dilated.  Estimated Blood Loss: minimal  Final Conclusions:  1. 3 vessel CAD with patency of the RCA stents and mild to moderate obstruction, total occlusion of the LCx, and severe stenosis of the proximal LAD 2. S/P multivessel CABG with continued patency of the LIMA-LAD and total occlusion of the SVG's 3. Extensive collateralization of the LCx from the RCA and LIMA-LAD 4. Preserved LV function 5. Dilated aortic sinuses with moderate AI  Echocardiogram 05/27/2014: Study Conclusions  - Left ventricle: The cavity size was normal. Wall thickness was increased in a pattern of severe LVH. Systolic function was normal. The estimated ejection fraction was in the range of  60% to 65%. Doppler parameters are consistent with abnormal left ventricular relaxation (grade 1 diastolic dysfunction). - Aortic valve: Moderately calcified annulus. Trileaflet; mildly thickened leaflets. There was mild regurgitation. Valve area (VTI): 2.99 cm^2. Valve area (Vmax): 2.07 cm^2. - Aorta: Aortic root dimension: 41 mm (ED). - Aortic root: The aortic root was mildly dilated. - Mitral valve: Mildly calcified annulus. Mildly thickened leaflets. - Left atrium: The atrium was severely dilated. - Technically difficult study.  Assessment and Plan:  1. Symptomatically stable multivessel CAD status post CABG. He is tolerating the current regimen well without progressive angina.  2. Intermittent leg edema. Discussed salt restriction, elevating his legs when able. LVEF  normal range with mild diastolic dysfunction by last assessment. We also discussed a prescription for when necessary low-dose Lasix.  3. Hyperlipidemia, on statin therapy with good lipid control over time. He follows with Dr. Luan Pulling.  4. PSVT, quiescent on current regimen including diltiazem CD and Toprol-XL.  Current medicines were reviewed with the patient today.  Disposition: Follow-up in 6 months.  Signed, Satira Sark, MD, [facility] 06/22/2016 2:45 PM    [facility] at [facility] [address], [place], [place] [address] Phone: [phone]; Fax: [phone]

## 2016-06-22 ENCOUNTER — Ambulatory Visit (INDEPENDENT_AMBULATORY_CARE_PROVIDER_SITE_OTHER): Payer: Medicare Other | Admitting: Cardiology

## 2016-06-22 ENCOUNTER — Ambulatory Visit: Payer: Medicare Other | Admitting: Cardiology

## 2016-06-22 ENCOUNTER — Encounter: Payer: Self-pay | Admitting: Cardiology

## 2016-06-22 VITALS — BP 126/72 | HR 61 | Ht 67.0 in | Wt 188.0 lb

## 2016-06-22 DIAGNOSIS — I25119 Atherosclerotic heart disease of native coronary artery with unspecified angina pectoris: Secondary | ICD-10-CM | POA: Diagnosis not present

## 2016-06-22 DIAGNOSIS — E782 Mixed hyperlipidemia: Secondary | ICD-10-CM

## 2016-06-22 DIAGNOSIS — R600 Localized edema: Secondary | ICD-10-CM | POA: Diagnosis not present

## 2016-06-22 DIAGNOSIS — I471 Supraventricular tachycardia: Secondary | ICD-10-CM | POA: Diagnosis not present

## 2016-06-22 MED ORDER — FUROSEMIDE 20 MG PO TABS: ORAL_TABLET | ORAL | 0 refills | Status: DC

## 2016-06-22 MED ORDER — NITROGLYCERIN 0.4 MG SL SUBL: 0.4000 mg | SUBLINGUAL_TABLET | SUBLINGUAL | 3 refills | Status: DC | PRN

## 2016-06-22 NOTE — Patient Instructions (Signed)
Your physician wants you to follow-up in: 6 months with Dr Ferne Reus will receive a reminder letter in the mail two months in advance. If you don't receive a letter, please call our office to schedule the follow-up appointment.    Take Lasix 20 mg once daily for leg swelling    Continue all other medications    If you need a refill on your cardiac medications before your next appointment, please call your pharmacy.    Thank you for choosing [facility] !

## 2016-06-26 ENCOUNTER — Other Ambulatory Visit: Payer: Self-pay | Admitting: Cardiology

## 2016-07-25 ENCOUNTER — Telehealth: Payer: Self-pay | Admitting: Cardiology

## 2016-07-25 NOTE — Telephone Encounter (Signed)
Pt says BP has been elevated for the last 2 days denies chest pain/dizziness - says has had some SOB with exertion - BP 151/74 HR 71 today after getting home and says he has felt fine today - wanted to make Dr Domenic Polite aware if he needed to be seen or just monitor since he feels better today. Routed to Dr Domenic Polite

## 2016-07-25 NOTE — Telephone Encounter (Signed)
Mr. Eric Beltran walked into office today stating that his blood pressure has been running high for 2 days. States that last time he took it was 190/90 HR  88 around 7pm on Tuesday night. He states that he feels fine today. Has not taken his BP today.

## 2016-07-25 NOTE — Telephone Encounter (Signed)
He should monitor blood pressure at home if he has a cuff and let us know trend. We may need to adjust medications.

## 2016-07-26 NOTE — Telephone Encounter (Signed)
Pt aware and will monitor BP and f/u with trend

## 2016-08-07 DIAGNOSIS — I10 Essential (primary) hypertension: Secondary | ICD-10-CM | POA: Diagnosis not present

## 2016-08-07 DIAGNOSIS — N401 Enlarged prostate with lower urinary tract symptoms: Secondary | ICD-10-CM | POA: Diagnosis not present

## 2016-08-07 DIAGNOSIS — I119 Hypertensive heart disease without heart failure: Secondary | ICD-10-CM | POA: Diagnosis not present

## 2016-08-07 DIAGNOSIS — J029 Acute pharyngitis, unspecified: Secondary | ICD-10-CM | POA: Diagnosis not present

## 2016-08-13 DIAGNOSIS — L814 Other melanin hyperpigmentation: Secondary | ICD-10-CM | POA: Diagnosis not present

## 2016-08-13 DIAGNOSIS — L908 Other atrophic disorders of skin: Secondary | ICD-10-CM | POA: Diagnosis not present

## 2016-08-13 DIAGNOSIS — C4402 Squamous cell carcinoma of skin of lip: Secondary | ICD-10-CM | POA: Diagnosis not present

## 2016-08-13 DIAGNOSIS — L578 Other skin changes due to chronic exposure to nonionizing radiation: Secondary | ICD-10-CM | POA: Diagnosis not present

## 2016-08-27 ENCOUNTER — Other Ambulatory Visit: Payer: Self-pay | Admitting: Cardiology

## 2016-10-09 DIAGNOSIS — H2513 Age-related nuclear cataract, bilateral: Secondary | ICD-10-CM | POA: Diagnosis not present

## 2016-10-14 ENCOUNTER — Other Ambulatory Visit: Payer: Self-pay | Admitting: Cardiology

## 2016-10-17 DIAGNOSIS — L905 Scar conditions and fibrosis of skin: Secondary | ICD-10-CM | POA: Diagnosis not present

## 2016-10-17 DIAGNOSIS — N2889 Other specified disorders of kidney and ureter: Secondary | ICD-10-CM | POA: Diagnosis not present

## 2016-10-17 DIAGNOSIS — R310 Gross hematuria: Secondary | ICD-10-CM | POA: Diagnosis not present

## 2016-10-17 DIAGNOSIS — Z85828 Personal history of other malignant neoplasm of skin: Secondary | ICD-10-CM | POA: Diagnosis not present

## 2016-11-15 MED ORDER — VALACYCLOVIR 500 MG TABLET
ORAL_TABLET | ORAL | 5 refills | 0.00000 days | Status: CP
Start: 2016-11-15 — End: 2017-05-15

## 2016-12-19 ENCOUNTER — Ambulatory Visit
Admission: RE | Admit: 2016-12-19 | Discharge: 2016-12-19 | Disposition: A | Discharge status: Home / Self Care | Payer: MEDICARE | Attending: Nurse Practitioner

## 2016-12-19 ENCOUNTER — Ambulatory Visit
Admission: RE | Admit: 2016-12-19 | Discharge: 2016-12-19 | Disposition: A | Discharge status: Home / Self Care | Payer: MEDICARE

## 2016-12-19 DIAGNOSIS — N2889 Other specified disorders of kidney and ureter: Secondary | ICD-10-CM | POA: Diagnosis not present

## 2016-12-28 ENCOUNTER — Ambulatory Visit (INDEPENDENT_AMBULATORY_CARE_PROVIDER_SITE_OTHER): Payer: Medicare Other | Admitting: Cardiology

## 2016-12-28 ENCOUNTER — Encounter: Payer: Self-pay | Admitting: Cardiology

## 2016-12-28 VITALS — BP 138/72 | HR 66 | Ht 67.0 in | Wt 195.0 lb

## 2016-12-28 DIAGNOSIS — I351 Nonrheumatic aortic (valve) insufficiency: Secondary | ICD-10-CM

## 2016-12-28 DIAGNOSIS — E782 Mixed hyperlipidemia: Secondary | ICD-10-CM

## 2016-12-28 DIAGNOSIS — I471 Supraventricular tachycardia: Secondary | ICD-10-CM | POA: Diagnosis not present

## 2016-12-28 DIAGNOSIS — I25119 Atherosclerotic heart disease of native coronary artery with unspecified angina pectoris: Secondary | ICD-10-CM | POA: Diagnosis not present

## 2016-12-28 NOTE — Progress Notes (Signed)
Cardiology Office Note  Date: 12/28/2016   ID: Eric Beltran, DOB 1941-05-10, MRN [id number]  PCP: Sinda Du, MD  Primary Cardiologist: Rozann Lesches, MD   Chief Complaint  Patient presents with  . Coronary Artery Disease    History of Present Illness: Eric Beltran is a 76 y.o. male last seen in February. He presents for a routine follow-up visit. Overall no major change in status. He reports occasional angina symptoms, not progressive. He has stable NYHA class II dyspnea. Still has episodes of palpitations with history of SVT, but continues on Cardizem CD and Toprol-XL in combination. These symptoms have not increased in frequency. He has had no syncope.  We went over his current cardiac regimen which is listed below. He reports no intolerances, no nitroglycerin use. He continues to follow with Dr. Luan Pulling and has routine lab work on Lipitor.  I personally reviewed his ECG today which shows sinus bradycardia with prolonged PR interval and old inferior infarct pattern.  Past Medical History:  Diagnosis Date  . Ascending aortic aneurysm [facility])    Status post repair 12/2011 at [facility]  . Atrial fibrillation (HCC)    Perioperative  . BPH (benign prostatic hyperplasia)   . Coronary atherosclerosis of native coronary artery    Multivessel status post prior stenting and ultimately CABG 12/2011 at [facility]  . Essential hypertension, benign   . Mixed hyperlipidemia   . Myocardial infarction ([place])   . Supraventricular tachycardia ([place])   5  Past Surgical History:  Procedure Laterality Date  . ASCENDING AORTIC ANEURYSM REPAIR  12/2011 [facility]   26 mm.Dacron graft  . CORONARY ARTERY BYPASS GRAFT  12/2011 [facility]   LIMA to LAD, SVG to PLB, SVG to ramus, SVG to OM  . HERNIA REPAIR    . LEFT HEART CATHETERIZATION WITH CORONARY ANGIOGRAM N/A 06/18/2014   Procedure: LEFT HEART CATHETERIZATION WITH CORONARY ANGIOGRAM;  Surgeon: Blane Ohara, MD;  Location: [facility] CATH LAB;  Service:  Cardiovascular;  Laterality: N/A;  . TOTAL HIP ARTHROPLASTY Right 02/07/2016   Procedure: RIGHT TOTAL HIP ARTHROPLASTY ANTERIOR APPROACH;  Surgeon: Mcarthur Rossetti, MD;  Location: [place];  Service: Orthopedics;  Laterality: Right;  . TRANSURETHRAL RESECTION OF PROSTATE      Current Outpatient Prescriptions  Medication Sig Dispense Refill  . aspirin EC 81 MG tablet Take 81 mg by mouth daily.    [name] atorvastatin (LIPITOR) 40 MG tablet TAKE 1 TABLET BY MOUTH AT BEDTIME 90 tablet 6  . diltiazem (CARDIZEM CD) 240 MG 24 hr capsule TAKE 1 CAPSULE BY MOUTH EVERY DAY 30 capsule 6  . furosemide (LASIX) 20 MG tablet TAKE 1 TABLET BY MOUTH AS NEEDED FOR LEG SWELLING 90 tablet 3  . gabapentin (NEURONTIN) 300 MG capsule Take 300 mg by mouth daily.    . metoprolol succinate (TOPROL-XL) 50 MG 24 hr tablet Take 1 tablet (50 mg total) by mouth daily. 30 tablet 6  . Multiple Vitamin (MULTIVITAMIN) tablet Take 1 tablet by mouth at bedtime.     . nitroGLYCERIN (NITROSTAT) 0.4 MG SL tablet Place 1 tablet (0.4 mg total) under the tongue every 5 (five) minutes as needed for chest pain. 25 tablet 3  . Omega-3 Fatty Acids (FISH OIL) 1000 MG CAPS Take 1 capsule by mouth daily.      . tadalafil (CIALIS) 5 MG tablet Take 5 mg by mouth as needed for erectile dysfunction.    . Tamsulosin HCl (FLOMAX) 0.4 MG CAPS Take 0.4 mg by  mouth daily.      No current facility-administered medications for this visit.    Allergies:  Patient has no known allergies.   Social History: The patient  reports that he has never smoked. He has never used smokeless tobacco. He reports that he drinks alcohol. He reports that he does not use drugs.   ROS:  Please see the history of present illness. Otherwise, complete review of systems is positive for intermittent leg edema - Lasix has been effective.  All other systems are reviewed and negative.   Physical Exam: VS:  BP 138/72   Pulse 66   Ht 5\' 7"  (1.702 m)   Wt 195 lb (88.5 kg)    SpO2 99%   BMI 30.54 kg/m , BMI Body mass index is 30.54 kg/m.  Wt Readings from Last 3 Encounters:  12/28/16 195 lb (88.5 kg)  06/22/16 188 lb (85.3 kg)  04/18/16 188 lb (85.3 kg)    General: Patient appears comfortable at rest. HEENT: Conjunctiva and lids normal, oropharynx clear. Neck: Supple, no elevated JVP or carotid bruits, no thyromegaly. Lungs: Clear to auscultation, nonlabored breathing at rest. Cardiac: Regular rate and rhythm, no S3. 2/6 diasotlic murmur, no pericardial rub. Abdomen: Soft, nontender, bowel sounds present, no guarding or rebound. Extremities: Trace leg edema, distal pulses 2+. Skin: Warm and dry. Musculoskeletal: No kyphosis. Neuropsychiatric: Alert and oriented x3, affect grossly appropriate.  ECG: I personally reviewed the tracing from 10/26/2015 which showed sinus bradycardia with prolonged PR interval and low voltage, old inferior infarct pattern.  Recent Labwork: 02/08/2016: BUN 13; Creatinine, Ser 1.06; Hemoglobin 12.9; Platelets 196; Potassium 3.9; Sodium 136   Other Studies Reviewed Today:  Echocardiogram 05/27/2014: Study Conclusions  - Left ventricle: The cavity size was normal. Wall thickness was increased in a pattern of severe LVH. Systolic function was normal. The estimated ejection fraction was in the range of 60% to 65%. Doppler parameters are consistent with abnormal left ventricular relaxation (grade 1 diastolic dysfunction). - Aortic valve: Moderately calcified annulus. Trileaflet; mildly thickened leaflets. There was mild regurgitation. Valve area (VTI): 2.99 cm^2. Valve area (Vmax): 2.07 cm^2. - Aorta: Aortic root dimension: 41 mm (ED). - Aortic root: The aortic root was mildly dilated. - Mitral valve: Mildly calcified annulus. Mildly thickened leaflets. - Left atrium: The atrium was severely dilated. - Technically difficult study.  Cardiac catheterization 06/18/2014: Left mainstem: Patent vessel, no significant  stenosis  Left anterior descending (LAD): 80% proximal stenosis. The diagonals and septals are patent. The mid-LAD fills from the LIMA  Left circumflex (LCx): Total occlusion at the ostium. Ostial ramus stent also occluded.  LIMA-LAD: widely patent without stenosis. Fills the mid/distal LAD and supplies a collateral to the intermediate branch  All SVG's are occluded at the aorta.  Right coronary artery (RCA): Large dominant vessel, heavily calcified. There is diffuse 40% in-stent restenosis through the mid vessel. The distal vessel has 50% stenosis before the bifurcation of the PDA and PLA. The branch vessels of the RCA are widely patnet and there is a well formed collateral to the left circumflex.  Left ventriculography: Left ventricular systolic function is normal, LVEF is estimated at 55%, there is no significant mitral regurgitation   Aortic root angio: the aortic sinuses are dilated and there is moderate AI. The ascending aorta also appears dilated.  Estimated Blood Loss: minimal  Final Conclusions:  1. 3 vessel CAD with patency of the RCA stents and mild to moderate obstruction, total occlusion of the LCx, and severe  stenosis of the proximal LAD 2. S/P multivessel CABG with continued patency of the LIMA-LAD and total occlusion of the SVG's 3. Extensive collateralization of the LCx from the RCA and LIMA-LAD 4. Preserved LV function 5. Dilated aortic sinuses with moderate AI  Assessment and Plan:  1. Multivessel CAD status post CABG with occluded vein grafts, patent LIMA to LAD, and extensive collateralization from the RCA and LIMA to LAD. We continue medical therapy with stable angina symptoms.  2. PSVT, intermittent palpitations described. No escalation in symptoms, will continue Cardizem CD and Toprol-XL.  3. Hyperlipidemia, continues on Lipitor. He follows with Dr. Luan Pulling.  4. Aortic regurgitation, mild by last echocardiogram in 2016. No significant change in  examination.  Current medicines were reviewed with the patient today.   Orders Placed This Encounter  Procedures  . EKG 12-Lead    Disposition: Follow-up in 6 months.  Signed, Satira Sark, MD, [facility] 12/28/2016 9:52 AM    [facility] [place], [place], [place] [address] Phone: [phone]; Fax: [phone]

## 2016-12-28 NOTE — Patient Instructions (Signed)

## 2017-01-21 ENCOUNTER — Ambulatory Visit: Payer: Medicare Other | Admitting: Cardiology

## 2017-02-19 ENCOUNTER — Ambulatory Visit: Admission: RE | Admit: 2017-02-19 | Discharge: 2017-02-19

## 2017-02-19 DIAGNOSIS — M47816 Spondylosis without myelopathy or radiculopathy, lumbar region: Secondary | ICD-10-CM | POA: Diagnosis not present

## 2017-02-22 DIAGNOSIS — M48061 Spinal stenosis, lumbar region without neurogenic claudication: Secondary | ICD-10-CM | POA: Diagnosis not present

## 2017-03-01 DIAGNOSIS — R079 Chest pain, unspecified: Secondary | ICD-10-CM | POA: Diagnosis not present

## 2017-03-01 DIAGNOSIS — I499 Cardiac arrhythmia, unspecified: Secondary | ICD-10-CM | POA: Diagnosis not present

## 2017-03-04 ENCOUNTER — Encounter (HOSPITAL_COMMUNITY): Payer: Self-pay | Admitting: *Deleted

## 2017-03-04 ENCOUNTER — Observation Stay (HOSPITAL_COMMUNITY)
Admission: EM | Admit: 2017-03-04 | Discharge: 2017-03-06 | Disposition: A | Discharge status: Home / Self Care | Payer: Medicare Other | Attending: Pulmonary Disease | Admitting: Pulmonary Disease

## 2017-03-04 DIAGNOSIS — Z23 Encounter for immunization: Secondary | ICD-10-CM | POA: Insufficient documentation

## 2017-03-04 DIAGNOSIS — Z951 Presence of aortocoronary bypass graft: Secondary | ICD-10-CM | POA: Insufficient documentation

## 2017-03-04 DIAGNOSIS — I25119 Atherosclerotic heart disease of native coronary artery with unspecified angina pectoris: Secondary | ICD-10-CM | POA: Diagnosis present

## 2017-03-04 DIAGNOSIS — I200 Unstable angina: Secondary | ICD-10-CM | POA: Diagnosis present

## 2017-03-04 DIAGNOSIS — N138 Other obstructive and reflux uropathy: Secondary | ICD-10-CM | POA: Diagnosis present

## 2017-03-04 DIAGNOSIS — I712 Thoracic aortic aneurysm, without rupture, unspecified: Secondary | ICD-10-CM | POA: Diagnosis present

## 2017-03-04 DIAGNOSIS — R079 Chest pain, unspecified: Secondary | ICD-10-CM

## 2017-03-04 DIAGNOSIS — R0789 Other chest pain: Secondary | ICD-10-CM | POA: Diagnosis not present

## 2017-03-04 DIAGNOSIS — I4891 Unspecified atrial fibrillation: Secondary | ICD-10-CM | POA: Diagnosis not present

## 2017-03-04 DIAGNOSIS — N401 Enlarged prostate with lower urinary tract symptoms: Secondary | ICD-10-CM | POA: Insufficient documentation

## 2017-03-04 DIAGNOSIS — Z79899 Other long term (current) drug therapy: Secondary | ICD-10-CM | POA: Insufficient documentation

## 2017-03-04 DIAGNOSIS — E782 Mixed hyperlipidemia: Secondary | ICD-10-CM | POA: Diagnosis present

## 2017-03-04 DIAGNOSIS — I2510 Atherosclerotic heart disease of native coronary artery without angina pectoris: Secondary | ICD-10-CM | POA: Diagnosis not present

## 2017-03-04 DIAGNOSIS — Z96641 Presence of right artificial hip joint: Secondary | ICD-10-CM | POA: Insufficient documentation

## 2017-03-04 DIAGNOSIS — I471 Supraventricular tachycardia: Secondary | ICD-10-CM | POA: Diagnosis not present

## 2017-03-04 DIAGNOSIS — I10 Essential (primary) hypertension: Secondary | ICD-10-CM | POA: Diagnosis not present

## 2017-03-04 DIAGNOSIS — Z7982 Long term (current) use of aspirin: Secondary | ICD-10-CM | POA: Diagnosis not present

## 2017-03-04 DIAGNOSIS — I4892 Unspecified atrial flutter: Secondary | ICD-10-CM

## 2017-03-04 DIAGNOSIS — I209 Angina pectoris, unspecified: Secondary | ICD-10-CM | POA: Diagnosis present

## 2017-03-04 DIAGNOSIS — R000 Tachycardia, unspecified: Secondary | ICD-10-CM | POA: Diagnosis not present

## 2017-03-04 HISTORY — DX: Chronic diastolic (congestive) heart failure: I50.32

## 2017-03-04 NOTE — ED Triage Notes (Signed)
Pt c/o central chest pain that started x 5 hours ago; pt describes the pain as a tightness and states the pain does not radiate; pt states he has had some sob and weakness and clammy feeling with the pain; pt took 2 nitro and 2 baby aspirin at home pta with no significant relief

## 2017-03-05 ENCOUNTER — Other Ambulatory Visit (HOSPITAL_COMMUNITY): Payer: Medicare Other

## 2017-03-05 ENCOUNTER — Encounter (HOSPITAL_COMMUNITY): Payer: Self-pay | Admitting: *Deleted

## 2017-03-05 ENCOUNTER — Emergency Department (HOSPITAL_COMMUNITY): Payer: Medicare Other

## 2017-03-05 DIAGNOSIS — R000 Tachycardia, unspecified: Secondary | ICD-10-CM | POA: Diagnosis not present

## 2017-03-05 DIAGNOSIS — I4892 Unspecified atrial flutter: Secondary | ICD-10-CM | POA: Diagnosis not present

## 2017-03-05 DIAGNOSIS — I5032 Chronic diastolic (congestive) heart failure: Secondary | ICD-10-CM

## 2017-03-05 DIAGNOSIS — I10 Essential (primary) hypertension: Secondary | ICD-10-CM | POA: Diagnosis not present

## 2017-03-05 DIAGNOSIS — I200 Unstable angina: Secondary | ICD-10-CM | POA: Diagnosis present

## 2017-03-05 DIAGNOSIS — E785 Hyperlipidemia, unspecified: Secondary | ICD-10-CM | POA: Diagnosis not present

## 2017-03-05 DIAGNOSIS — I471 Supraventricular tachycardia: Secondary | ICD-10-CM

## 2017-03-05 DIAGNOSIS — I248 Other forms of acute ischemic heart disease: Secondary | ICD-10-CM

## 2017-03-05 DIAGNOSIS — E782 Mixed hyperlipidemia: Secondary | ICD-10-CM | POA: Diagnosis not present

## 2017-03-05 DIAGNOSIS — R079 Chest pain, unspecified: Secondary | ICD-10-CM

## 2017-03-05 DIAGNOSIS — I499 Cardiac arrhythmia, unspecified: Secondary | ICD-10-CM | POA: Diagnosis not present

## 2017-03-05 DIAGNOSIS — I25708 Atherosclerosis of coronary artery bypass graft(s), unspecified, with other forms of angina pectoris: Secondary | ICD-10-CM | POA: Diagnosis not present

## 2017-03-05 DIAGNOSIS — I209 Angina pectoris, unspecified: Secondary | ICD-10-CM | POA: Diagnosis present

## 2017-03-05 LAB — TSH: TSH: 1.17 u[IU]/mL (ref 0.350–4.500)

## 2017-03-05 LAB — CBC
HCT: 43.9 % (ref 39.0–52.0)
HEMATOCRIT: 42.3 % (ref 39.0–52.0)
Hemoglobin: 13.8 g/dL (ref 13.0–17.0)
Hemoglobin: 14.8 g/dL (ref 13.0–17.0)
MCH: 28.3 pg (ref 26.0–34.0)
MCH: 29 pg (ref 26.0–34.0)
MCHC: 32.6 g/dL (ref 30.0–36.0)
MCHC: 33.7 g/dL (ref 30.0–36.0)
MCV: 85.9 fL (ref 78.0–100.0)
MCV: 86.7 fL (ref 78.0–100.0)
PLATELETS: 231 10*3/uL (ref 150–400)
PLATELETS: 237 10*3/uL (ref 150–400)
RBC: 4.88 MIL/uL (ref 4.22–5.81)
RBC: 5.11 MIL/uL (ref 4.22–5.81)
RDW: 15.1 % (ref 11.5–15.5)
RDW: 15.3 % (ref 11.5–15.5)
WBC: 11.1 10*3/uL — AB (ref 4.0–10.5)
WBC: 12.3 10*3/uL — ABNORMAL HIGH (ref 4.0–10.5)

## 2017-03-05 LAB — COMPREHENSIVE METABOLIC PANEL
ALBUMIN: 3.7 g/dL (ref 3.5–5.0)
ALT: 15 U/L — ABNORMAL LOW (ref 17–63)
ANION GAP: 10 (ref 5–15)
AST: 15 U/L (ref 15–41)
Alkaline Phosphatase: 75 U/L (ref 38–126)
BILIRUBIN TOTAL: 0.6 mg/dL (ref 0.3–1.2)
BUN: 23 mg/dL — ABNORMAL HIGH (ref 6–20)
CHLORIDE: 106 mmol/L (ref 101–111)
CO2: 23 mmol/L (ref 22–32)
Calcium: 8.6 mg/dL — ABNORMAL LOW (ref 8.9–10.3)
Creatinine, Ser: 0.97 mg/dL (ref 0.61–1.24)
GFR calc Af Amer: >60 mL/min (ref >60)
GFR calc non Af Amer: >60 mL/min (ref >60)
GLUCOSE: 109 mg/dL — AB (ref 65–99)
POTASSIUM: 3.5 mmol/L (ref 3.5–5.1)
SODIUM: 139 mmol/L (ref 135–145)
TOTAL PROTEIN: 6.2 g/dL — AB (ref 6.5–8.1)

## 2017-03-05 LAB — I-STAT TROPONIN, ED: TROPONIN I, POC: 0.01 ng/mL (ref 0.00–0.08)

## 2017-03-05 LAB — BASIC METABOLIC PANEL
Anion gap: 10 (ref 5–15)
BUN: 24 mg/dL — ABNORMAL HIGH (ref 6–20)
CALCIUM: 9.2 mg/dL (ref 8.9–10.3)
CO2: 22 mmol/L (ref 22–32)
CREATININE: 1.05 mg/dL (ref 0.61–1.24)
Chloride: 106 mmol/L (ref 101–111)
GFR calc non Af Amer: >60 mL/min (ref >60)
Glucose, Bld: 112 mg/dL — ABNORMAL HIGH (ref 65–99)
Potassium: 3.8 mmol/L (ref 3.5–5.1)
Sodium: 138 mmol/L (ref 135–145)

## 2017-03-05 LAB — PROTIME-INR
INR: 1.13
Prothrombin Time: 14.4 seconds (ref 11.4–15.2)

## 2017-03-05 LAB — HEPARIN LEVEL (UNFRACTIONATED): HEPARIN UNFRACTIONATED: 0.58 [IU]/mL (ref 0.30–0.70)

## 2017-03-05 LAB — TROPONIN I
TROPONIN I: 0.04 ng/mL — AB (ref <0.03)
TROPONIN I: 0.07 ng/mL — AB (ref <0.03)
Troponin I: 0.07 ng/mL (ref <0.03)
Troponin I: <0.03 ng/mL (ref <0.03)

## 2017-03-05 LAB — APTT: aPTT: 37 seconds — ABNORMAL HIGH (ref 24–36)

## 2017-03-05 LAB — MAGNESIUM: MAGNESIUM: 1.9 mg/dL (ref 1.7–2.4)

## 2017-03-05 LAB — D-DIMER, QUANTITATIVE (NOT AT ARMC): D DIMER QUANT: 0.3 ug{FEU}/mL (ref 0.00–0.50)

## 2017-03-05 LAB — BRAIN NATRIURETIC PEPTIDE: B NATRIURETIC PEPTIDE 5: 318 pg/mL — AB (ref 0.0–100.0)

## 2017-03-05 MED ORDER — ACETAMINOPHEN 325 MG PO TABS
650.0000 mg | ORAL_TABLET | Freq: Four times a day (QID) | ORAL | Status: DC | PRN
  Administered 2017-03-06: 650 mg via ORAL
  Filled 2017-03-05: qty 2

## 2017-03-05 MED ORDER — DILTIAZEM HCL ER COATED BEADS 240 MG PO CP24
240.0000 mg | ORAL_CAPSULE | Freq: Every day | ORAL | Status: DC
  Administered 2017-03-05 – 2017-03-06 (×2): 240 mg via ORAL
  Filled 2017-03-05 (×2): qty 1

## 2017-03-05 MED ORDER — INFLUENZA VAC SPLIT HIGH-DOSE 0.5 ML IM SUSY
0.5000 mL | PREFILLED_SYRINGE | INTRAMUSCULAR | Status: AC
  Administered 2017-03-06: 0.5 mL via INTRAMUSCULAR
  Filled 2017-03-05: qty 0.5

## 2017-03-05 MED ORDER — SODIUM CHLORIDE 0.9% FLUSH
3.0000 mL | Freq: Two times a day (BID) | INTRAVENOUS | Status: DC
  Administered 2017-03-05 – 2017-03-06 (×3): 3 mL via INTRAVENOUS

## 2017-03-05 MED ORDER — SODIUM CHLORIDE 0.9 % IV BOLUS (SEPSIS)
500.0000 mL | Freq: Once | INTRAVENOUS | Status: AC
  Administered 2017-03-05: 500 mL via INTRAVENOUS

## 2017-03-05 MED ORDER — TAMSULOSIN HCL 0.4 MG PO CAPS
0.4000 mg | ORAL_CAPSULE | Freq: Every day | ORAL | Status: DC
  Administered 2017-03-05 – 2017-03-06 (×2): 0.4 mg via ORAL
  Filled 2017-03-05 (×2): qty 1

## 2017-03-05 MED ORDER — ATORVASTATIN CALCIUM 40 MG PO TABS
40.0000 mg | ORAL_TABLET | Freq: Every day | ORAL | Status: DC
  Administered 2017-03-05: 40 mg via ORAL
  Filled 2017-03-05: qty 1

## 2017-03-05 MED ORDER — HEPARIN BOLUS VIA INFUSION
4500.0000 [IU] | Freq: Once | INTRAVENOUS | Status: AC
  Administered 2017-03-05: 4500 [IU] via INTRAVENOUS

## 2017-03-05 MED ORDER — METOPROLOL TARTRATE 5 MG/5ML IV SOLN
2.5000 mg | Freq: Once | INTRAVENOUS | Status: AC
  Administered 2017-03-05: 2.5 mg via INTRAVENOUS

## 2017-03-05 MED ORDER — METOPROLOL TARTRATE 5 MG/5ML IV SOLN
2.5000 mg | Freq: Once | INTRAVENOUS | Status: AC
  Administered 2017-03-05: 2.5 mg via INTRAVENOUS
  Filled 2017-03-05: qty 5

## 2017-03-05 MED ORDER — HEPARIN (PORCINE) IN NACL 100-0.45 UNIT/ML-% IJ SOLN
1300.0000 [IU]/h | INTRAMUSCULAR | Status: DC
  Administered 2017-03-05: 1300 [IU]/h via INTRAVENOUS
  Filled 2017-03-05: qty 250

## 2017-03-05 MED ORDER — METOPROLOL SUCCINATE ER 50 MG PO TB24
50.0000 mg | ORAL_TABLET | Freq: Every day | ORAL | Status: DC
  Administered 2017-03-05 – 2017-03-06 (×2): 50 mg via ORAL
  Filled 2017-03-05 (×2): qty 1

## 2017-03-05 MED ORDER — DILTIAZEM HCL-DEXTROSE 100-5 MG/100ML-% IV SOLN (PREMIX): 5.0000 mg/h | INTRAVENOUS | Status: DC

## 2017-03-05 MED ORDER — ACETAMINOPHEN 650 MG RE SUPP: 650.0000 mg | Freq: Four times a day (QID) | RECTAL | Status: DC | PRN

## 2017-03-05 MED ORDER — POTASSIUM CHLORIDE CRYS ER 20 MEQ PO TBCR
20.0000 meq | EXTENDED_RELEASE_TABLET | Freq: Two times a day (BID) | ORAL | Status: DC
  Administered 2017-03-05 – 2017-03-06 (×3): 20 meq via ORAL
  Filled 2017-03-05 (×3): qty 1

## 2017-03-05 MED ORDER — SODIUM CHLORIDE 0.9 % IV SOLN
250.0000 mL | INTRAVENOUS | Status: DC | PRN
Start: 2017-03-05 — End: 2017-03-06

## 2017-03-05 MED ORDER — ASPIRIN EC 81 MG PO TBEC
81.0000 mg | DELAYED_RELEASE_TABLET | Freq: Every day | ORAL | Status: DC
  Administered 2017-03-05 – 2017-03-06 (×2): 81 mg via ORAL
  Filled 2017-03-05 (×2): qty 1

## 2017-03-05 MED ORDER — SODIUM CHLORIDE 0.9% FLUSH: 3.0000 mL | INTRAVENOUS | Status: DC | PRN

## 2017-03-05 MED ORDER — DILTIAZEM LOAD VIA INFUSION
10.0000 mg | Freq: Once | INTRAVENOUS | Status: DC
  Filled 2017-03-05: qty 10

## 2017-03-05 MED ORDER — GABAPENTIN 300 MG PO CAPS
300.0000 mg | ORAL_CAPSULE | Freq: Every day | ORAL | Status: DC
  Administered 2017-03-05 – 2017-03-06 (×2): 300 mg via ORAL
  Filled 2017-03-05 (×2): qty 1

## 2017-03-05 NOTE — Care Management Obs Status (Signed)
[facility] NOTIFICATION   Patient Details  Name: Eric Beltran MRN: [id number] Date of Birth: [birth year]   Medicare Observation Status Notification Given:  Yes    Sherald Barge, RN 03/05/2017, 11:51 AM

## 2017-03-05 NOTE — H&P (Signed)
TRH H&P    Patient Demographics:    Eric Beltran, is a 76 y.o. male  MRN: [id number]  DOB - January 07, 1941  Admit Date - 03/04/2017  Referring MD/NP/PA: Dr Wyvonnia Dusky  Outpatient Primary MD for the patient is Sinda Du, MD  Patient coming from: Home  Chief Complaint  Patient presents with  . Chest Pain      HPI:    Eric Beltran  is a 76 y.o. male, with history of atrial fibrillation, CAD, hypertension, PSVT, hypertension came to hospital with complaints of chest pain starting around 7:30 PM while he was resting.  Pain radiated across the chest.  He denies shortness of breath.  Patient took 2 nitroglycerin tablets at home with no significant change. He also complained of some palpitations.  Patient says that he has been having these episodes since his bypass surgery in 2013.  He denies nausea vomiting or diarrhea. Denies dysuria Denies fever or chills Denies shortness of breath or cough. Denies passing out.   In the ED EKG showed, cardiology was consulted at [facility]. Started on heparin per pharmacy.   Review of systems:      All other systems reviewed and are negative.   With Past History of the following :    Past Medical History:  Diagnosis Date  . Ascending aortic aneurysm [facility])    Status post repair 12/2011 at [facility]  . Atrial fibrillation (HCC)    Perioperative  . BPH (benign prostatic hyperplasia)   . Coronary atherosclerosis of native coronary artery    Multivessel status post prior stenting and ultimately CABG 12/2011 at [facility]  . Essential hypertension, benign   . Mixed hyperlipidemia   . Myocardial infarction ([place])   . Supraventricular tachycardia [facility])       Past Surgical History:  Procedure Laterality Date  . ASCENDING AORTIC ANEURYSM REPAIR  12/2011 [facility]   26 mm.Dacron graft  . CORONARY ARTERY BYPASS GRAFT  12/2011 [facility]   LIMA to LAD, SVG to PLB, SVG to  ramus, SVG to OM  . HERNIA REPAIR    . LEFT HEART CATHETERIZATION WITH CORONARY ANGIOGRAM N/A 06/18/2014   Procedure: LEFT HEART CATHETERIZATION WITH CORONARY ANGIOGRAM;  Surgeon: Blane Ohara, MD;  Location: [facility] CATH LAB;  Service: Cardiovascular;  Laterality: N/A;  . TOTAL HIP ARTHROPLASTY Right 02/07/2016   Procedure: RIGHT TOTAL HIP ARTHROPLASTY ANTERIOR APPROACH;  Surgeon: Mcarthur Rossetti, MD;  Location: [place];  Service: Orthopedics;  Laterality: Right;  . TRANSURETHRAL RESECTION OF PROSTATE        Social History:      Social History  Substance Use Topics  . Smoking status: Never Smoker  . Smokeless tobacco: Never Used     Comment: tobacco use - no  . Alcohol use 0.0 oz/week     Comment: Occasional       Family History :     Family History  Problem Relation Age of Onset  . Hypertension Father   . Parkinson's disease Father   . Hypertension Mother   . Lung  cancer Sister       Home Medications:   Prior to Admission medications   Medication Sig Start Date End Date Taking? Authorizing Provider  aspirin EC 81 MG tablet Take 81 mg by mouth daily.    [provider]  atorvastatin (LIPITOR) 40 MG tablet TAKE 1 TABLET BY MOUTH AT BEDTIME 10/15/16   Satira Sark, MD  diltiazem (CARDIZEM CD) 240 MG 24 hr capsule TAKE 1 CAPSULE BY MOUTH EVERY DAY 10/15/16   Satira Sark, MD  furosemide (LASIX) 20 MG tablet TAKE 1 TABLET BY MOUTH AS NEEDED FOR LEG SWELLING 08/27/16   Satira Sark, MD  gabapentin (NEURONTIN) 300 MG capsule Take 300 mg by mouth daily.    [provider]  metoprolol succinate (TOPROL-XL) 50 MG 24 hr tablet Take 1 tablet (50 mg total) by mouth daily. 10/13/14   Satira Sark, MD  Multiple Vitamin (MULTIVITAMIN) tablet Take 1 tablet by mouth at bedtime.     [provider]  nitroGLYCERIN (NITROSTAT) 0.4 MG SL tablet Place 1 tablet (0.4 mg total) under the tongue every 5 (five) minutes as needed for chest pain. 06/22/16    Satira Sark, MD  Omega-3 Fatty Acids (FISH OIL) 1000 MG CAPS Take 1 capsule by mouth daily.      [provider]  tadalafil (CIALIS) 5 MG tablet Take 5 mg by mouth as needed for erectile dysfunction.    [provider]  Tamsulosin HCl (FLOMAX) 0.4 MG CAPS Take 0.4 mg by mouth daily.     [provider]     Allergies:    No Known Allergies   Physical Exam:   Vitals  Blood pressure 101/79, pulse 69, temperature 97.6 F (36.4 C), temperature source Oral, resp. rate 15, height 5\' 7"  (1.702 m), weight 85.7 kg (189 lb), SpO2 98 %.  1.  General: Appears in no acute distress  2. Psychiatric:  Intact judgement and  insight, awake alert, oriented x 3.  3. Neurologic: No focal neurological deficits, all cranial nerves intact.Strength 5/5 all 4 extremities, sensation intact all 4 extremities, plantars down going.  4. Eyes :  anicteric sclerae, moist conjunctivae with no lid lag. PERRLA.  5. ENMT:  Oropharynx clear with moist mucous membranes and good dentition  6. Neck:  supple, no cervical lymphadenopathy appriciated, No thyromegaly  7. Respiratory : Normal respiratory effort, good air movement bilaterally,clear to  auscultation bilaterally  8. Cardiovascular : RRR, no gallops, rubs or murmurs, no leg edema  9. Gastrointestinal:  Positive bowel sounds, abdomen soft, non-tender to palpation,no hepatosplenomegaly, no rigidity or guarding       10. Skin:  No cyanosis, normal texture and turgor, no rash, lesions or ulcers  11.Musculoskeletal:  Good muscle tone,  joints appear normal , no effusions,  normal range of motion    Data Review:    CBC  Recent Labs Lab 03/05/17 0002  WBC 12.3*  HGB 14.8  HCT 43.9  PLT 231  MCV 85.9  MCH 29.0  MCHC 33.7  RDW 15.1   ------------------------------------------------------------------------------------------------------------------  Chemistries   Recent Labs Lab 03/05/17 0002  NA 138    K 3.8  CL 106  CO2 22  GLUCOSE 112*  BUN 24*  CREATININE 1.05  CALCIUM 9.2   ------------------------------------------------------------------------------------------------------------------  ------------------------------------------------------------------------------------------------------------------ GFR: Estimated Creatinine Clearance: 63.5 mL/min (by C-G formula based on SCr of 1.05 mg/dL). Liver Function Tests: No results for input(s): AST, ALT, ALKPHOS, BILITOT, PROT, ALBUMIN in the last 168  hours. No results for input(s): LIPASE, AMYLASE in the last 168 hours. No results for input(s): AMMONIA in the last 168 hours. Coagulation Profile: No results for input(s): INR, PROTIME in the last 168 hours. Cardiac Enzymes:  Recent Labs Lab 03/05/17 0002  TROPONINI <0.03    --------------------------------------------------------------------------------------------------------------- Urine analysis:    Component Value Date/Time   COLORURINE YELLOW 06/11/2012 2038   APPEARANCEUR CLEAR 06/11/2012 2038   LABSPEC 1.010 06/11/2012 2038   PHURINE 5.5 06/11/2012 2038   GLUCOSEU NEGATIVE 06/11/2012 2038   HGBUR TRACE (A) 06/11/2012 2038   BILIRUBINUR NEGATIVE 06/11/2012 2038   KETONESUR NEGATIVE 06/11/2012 2038   PROTEINUR NEGATIVE 06/11/2012 2038   UROBILINOGEN 0.2 06/11/2012 2038   NITRITE NEGATIVE 06/11/2012 2038   LEUKOCYTESUR NEGATIVE 06/11/2012 2038      Imaging Results:    Dg Chest Port 1 View  Result Date: 03/05/2017 CLINICAL DATA:  Acute onset of mid chest pain and shortness of breath. Initial encounter. EXAM: PORTABLE CHEST 1 VIEW COMPARISON:  Chest radiograph performed 05/05/2012 FINDINGS: The lungs are well-aerated. Left basilar airspace opacity raises concern for pneumonia. There is no evidence of pleural effusion or pneumothorax. The cardiomediastinal silhouette is borderline normal in size. No acute osseous abnormalities are seen. IMPRESSION: Left basilar  airspace opacity raises concern for pneumonia. Followup PA and lateral chest X-ray is recommended in 3-4 weeks following trial of antibiotic therapy to ensure resolution and exclude underlying malignancy. Electronically Signed   By: Garald Balding M.D.   On: 03/05/2017 00:41    My personal review of EKG: Rhythm -atrial flutter   Assessment & Plan:    Active Problems:   Chest pain   1. Chest pain-placed under observation, cycle cardiac enzymes every 6 hours x3. 2. Atrial flutter-patient presented with atrial flutter and has now converted back to normal sinus rhythm.  He has been started on heparin per pharmacy.  Will consult cardiology in a.m. for discussion regarding long-term.  Continue Cardizem, metoprolol anticoagulation.CHA2DS2VASc score is at least 4 3. CAD, status post CABG-continue aspirin, Lipitor   DVT Prophylaxis-   heparin  AM Labs Ordered, also please review Full Orders  Family Communication: Admission, patients condition and plan of care including tests being ordered have been discussed with the patient  who indicate understanding and agree with the plan and Code Status.  Code Status: Full code  Admission status: Observation  Time spent in minutes : 50  minutes   Carlisa Eble S M.D on 03/05/2017 at 2:53 AM  Between 7am to 7pm - Pager - [phone]. After 7pm go to [email or web address] - password [facility]  [facility] - Office  [phone]

## 2017-03-05 NOTE — Progress Notes (Addendum)
CRITICAL VALUE ALERT  Critical Value:  Troponin 0.07  Date & Time Notied:  03/05/17 1000  Provider Notified: Luan Pulling  Orders Received/Actions taken: Cardiology consulted

## 2017-03-05 NOTE — Consult Note (Signed)
Cardiology Consultation:   Patient ID: Eric Beltran; [id number]; 03-24-1941   Admit date: 03/04/2017 Date of Consult: 03/05/2017  Primary Care Provider: Sinda Du, MD Primary Cardiologist: Dr. Domenic Polite  Chief Complaint: chest tightness  Patient Profile:   Eric Beltran is a 76 y.o. male with a hx of CAD s/p prior stenting then CABG with LIMA to LAD, SVG to PLB, SVG to ramus, SVG to OM in conjunction with ascending aortic aneurysm repair 2013 @ [facility], reported perioperative atrial fib, chronic dyspnpea, HTN, HLD, BPH, SVT, suspected chronic diastolic CHF (uses Lasix for edema), baseline sinus bradycardia, renal mass (followed by urology)  who is being seen today for the evaluation of arrhythmia, elevated troponin and chest pain at the request of Dr. Luan Pulling.  History of Present Illness:   To recap history, his last ischemic eval was in 2016. 2D Echo 05/2014 showed severe LVH, EF 60-65%, grade 1 DD, mild AI, mild LAE, mildly dilated aortic root, severely dilated LA. He had a nuclear stress test 05/2014 showing moderate degree of lateral wall ischemia extending from apex to the base with moderate lateral wall hypokinesis, EF 57%. This prompted cath in 06/2014 showed patency of the RCA stents and mild to moderate obstruction, total occlusion of the LCx, and severe stenosis of the proximal LAD, continued patency of the LIMA-LAD and total occlusion of the SVG's, extensive collateralization of the LCx from the RCA and LIMA-LAD, and dilated aortic sinuses with moderate AI. Medical therapy was recommended at that time. Dr Burt Knack did not see any feasible way to percutaneously revascularize the left circumflex territory. Outside [facility] urology notes indicate known right posterior interpolar renal mass since 2014. Due to medical comorbidities at that time he decided to pursue a course of active surveillance.  In general he considers himself very active. He owns a [employer] and states he's  always coming and going with walking for activity. He does not formally exercise or do yardwork but has not any recent functional limitation with ADLs or going up steps for example. Yesterday he was driving from [place] to [place] following a funeral and noticed a sensation of vague chest pressure. He took 2 SL NTG at home without any relief or change in symptoms. He felt mild intermittent SOB. No n/v/diaphoresis. He called his niece (a Marine scientist) who came over and noticed his HR was elevated above baseline in the 120s. She drove him to the hospital where he was found to be tachycardic with a RBBB. He received 2 doses of IV Lopressor with significant improvement in symptoms. He did not feel specific palpitations. Around 2am he spontaneously converted to NSR. He has remained pain free since that time and feels well. Troponins 0.01 POC, <0.03->0.04 full, WBC 12.3, BUN 24, Cr 1.05, K 3.8-3.5, d-dimer negative. Left basilar airspace opacity concerning for PNA (repeat recommended 3-4 weeks to exclude malignancy). He was started on IV heparin due to concern for this representing atrial flutter.  Review of chart would indicate SVT occurred remotely back in 2008, no strips to review. Telemetry also shows what appears to be a transiently prolonged PR interval this AM without significant pause.  Past Medical History:  Diagnosis Date  . Ascending aortic aneurysm [facility])    Status post repair 12/2011 at [facility]  . Atrial fibrillation (HCC)    Perioperative  . BPH (benign prostatic hyperplasia)   . Chronic shortness of breath   . Coronary atherosclerosis of native coronary artery    a. Multivessel status post  prior stenting and ultimately CABG 12/2011 at [facility] with with LIMA to LAD, SVG to PLB, SVG to ramus, SVG to OM . b. Last cath 06/2014 (following ischemic nuc) -> medical therapy, no feasible way to revascularize LCx territory.  . Essential hypertension, benign   . Mixed hyperlipidemia   . Myocardial infarction  ([place])   . Supraventricular tachycardia [facility])     Past Surgical History:  Procedure Laterality Date  . ASCENDING AORTIC ANEURYSM REPAIR  12/2011 [facility]   26 mm.Dacron graft  . CORONARY ARTERY BYPASS GRAFT  12/2011 [facility]   LIMA to LAD, SVG to PLB, SVG to ramus, SVG to OM  . HERNIA REPAIR    . LEFT HEART CATHETERIZATION WITH CORONARY ANGIOGRAM N/A 06/18/2014   Procedure: LEFT HEART CATHETERIZATION WITH CORONARY ANGIOGRAM;  Surgeon: Blane Ohara, MD;  Location: [facility] CATH LAB;  Service: Cardiovascular;  Laterality: N/A;  . TOTAL HIP ARTHROPLASTY Right 02/07/2016   Procedure: RIGHT TOTAL HIP ARTHROPLASTY ANTERIOR APPROACH;  Surgeon: Mcarthur Rossetti, MD;  Location: [place];  Service: Orthopedics;  Laterality: Right;  . TRANSURETHRAL RESECTION OF PROSTATE       Inpatient Medications: Scheduled Meds: . aspirin EC  81 mg Oral Daily  . atorvastatin  40 mg Oral QHS  . diltiazem  240 mg Oral Daily  . gabapentin  300 mg Oral Daily  . [START ON 03/06/2017] Influenza vac split quadrivalent PF  0.5 mL Intramuscular Tomorrow-1000  . metoprolol succinate  50 mg Oral Daily  . potassium chloride  20 mEq Oral BID  . sodium chloride flush  3 mL Intravenous Q12H  . tamsulosin  0.4 mg Oral Daily   Continuous Infusions: . sodium chloride    . heparin 1,300 Units/hr (03/05/17 0257)   PRN Meds: sodium chloride, acetaminophen **OR** acetaminophen, sodium chloride flush  Home Meds: Prior to Admission medications   Medication Sig Start Date End Date Taking? Authorizing Provider  aspirin EC 81 MG tablet Take 81 mg by mouth daily.    [provider]  atorvastatin (LIPITOR) 40 MG tablet TAKE 1 TABLET BY MOUTH AT BEDTIME 10/15/16   Satira Sark, MD  diltiazem (CARDIZEM CD) 240 MG 24 hr capsule TAKE 1 CAPSULE BY MOUTH EVERY DAY 10/15/16   Satira Sark, MD  furosemide (LASIX) 20 MG tablet TAKE 1 TABLET BY MOUTH AS NEEDED FOR LEG SWELLING 08/27/16   Satira Sark, MD  gabapentin  (NEURONTIN) 300 MG capsule Take 300 mg by mouth daily.    [provider]  metoprolol succinate (TOPROL-XL) 50 MG 24 hr tablet Take 1 tablet (50 mg total) by mouth daily. 10/13/14   Satira Sark, MD  Multiple Vitamin (MULTIVITAMIN) tablet Take 1 tablet by mouth at bedtime.     [provider]  nitroGLYCERIN (NITROSTAT) 0.4 MG SL tablet Place 1 tablet (0.4 mg total) under the tongue every 5 (five) minutes as needed for chest pain. 06/22/16   Satira Sark, MD  Omega-3 Fatty Acids (FISH OIL) 1000 MG CAPS Take 1 capsule by mouth daily.      [provider]  tadalafil (CIALIS) 5 MG tablet Take 5 mg by mouth as needed for erectile dysfunction.    [provider]  Tamsulosin HCl (FLOMAX) 0.4 MG CAPS Take 0.4 mg by mouth daily.     [provider]  valACYclovir (VALTREX) 500 MG tablet  02/22/17   [provider]    Allergies:    Allergies  Allergen Reactions  .  Other     Social History:   Social History   Social History  . Marital status: Widowed    Spouse name: N/A  . Number of children: N/A  . Years of education: N/A   Occupational History  . Not on file.   Social History Main Topics  . Smoking status: Never Smoker  . Smokeless tobacco: Never Used     Comment: tobacco use - no  . Alcohol use 0.0 oz/week     Comment: Occasional  . Drug use: No  . Sexual activity: Not on file   Other Topics Concern  . Not on file   Social History Narrative   Retired, widower, does not get regular exercise.           Family History:   The patient's family history includes Hypertension in his father and mother; Lung cancer in his sister; Parkinson's disease in his father.  ROS:  Please see the history of present illness.  All other ROS reviewed and negative.     Physical Exam/Data:   Vitals:   03/05/17 0215 03/05/17 0230 03/05/17 0245 03/05/17 0300  BP:  112/74  120/74  Pulse: 69 65 66 65  Resp: 15 14 17 17   Temp:        TempSrc:      SpO2: 98% 96% 96% 97%  Weight:      Height:        Intake/Output Summary (Last 24 hours) at 03/05/17 0833 Last data filed at 03/05/17 0600  Gross per 24 hour  Intake           539.65 ml  Output                0 ml  Net           539.65 ml   Filed Weights   03/04/17 2357  Weight: 189 lb (85.7 kg)   Body mass index is 29.6 kg/m.  General: Well developed, well nourished WM, in no acute distress. Head: Normocephalic, atraumatic, sclera non-icteric, no xanthomas, nares are without discharge.  Neck: Negative for carotid bruits. JVD not elevated. Lungs: Clear bilaterally to auscultation without wheezes, rales, or rhonchi. Breathing is unlabored. Heart: RRR with S1 S2. No murmurs, rubs, or gallops appreciated. Abdomen: Soft, non-tender, non-distended with normoactive bowel sounds. No hepatomegaly. No rebound/guarding. No obvious abdominal masses. Msk:  Strength and tone appear normal for age. Extremities: No clubbing or cyanosis. No edema.  Distal pedal pulses are 2+ and equal bilaterally. Neuro: Alert and oriented X 3. No facial asymmetry. No focal deficit. Moves all extremities spontaneously. Psych:  Responds to questions appropriately with a normal affect.  EKG:  The EKG was personally reviewed and demonstrates regular wide complex tachycardia with RBB and occasional furher widening of QRS with similar axis to RBBB. 120bpm. F/u EKG 10/23 showed NSR 73bpm, 1st degree AVB nonspecific ST-T Changes with NSIVCD  Relevant CV Studies: Referenced above  Laboratory Data:  Chemistry Recent Labs Lab 03/05/17 0002 03/05/17 0404  NA 138 139  K 3.8 3.5  CL 106 106  CO2 22 23  GLUCOSE 112* 109*  BUN 24* 23*  CREATININE 1.05 0.97  CALCIUM 9.2 8.6*  GFRNONAA >60 >60  GFRAA >60 >60  ANIONGAP 10 10     Recent Labs Lab 03/05/17 0404  PROT 6.2*  ALBUMIN 3.7  AST 15  ALT 15*  ALKPHOS 75  BILITOT 0.6   Hematology Recent Labs Lab 03/05/17 0002 03/05/17 0404   WBC  12.3* 11.1*  RBC 5.11 4.88  HGB 14.8 13.8  HCT 43.9 42.3  MCV 85.9 86.7  MCH 29.0 28.3  MCHC 33.7 32.6  RDW 15.1 15.3  PLT 231 237   Cardiac Enzymes Recent Labs Lab 03/05/17 0002 03/05/17 0404  TROPONINI <0.03 0.04*    Recent Labs Lab 03/05/17 0034  TROPIPOC 0.01    BNP Recent Labs Lab 03/05/17 0002  BNP 318.0*    DDimer  Recent Labs Lab 03/05/17 0002  DDIMER 0.30    Radiology/Studies:  Dg Chest Port 1 View  Result Date: 03/05/2017 CLINICAL DATA:  Acute onset of mid chest pain and shortness of breath. Initial encounter. EXAM: PORTABLE CHEST 1 VIEW COMPARISON:  Chest radiograph performed 05/05/2012 FINDINGS: The lungs are well-aerated. Left basilar airspace opacity raises concern for pneumonia. There is no evidence of pleural effusion or pneumothorax. The cardiomediastinal silhouette is borderline normal in size. No acute osseous abnormalities are seen. IMPRESSION: Left basilar airspace opacity raises concern for pneumonia. Followup PA and lateral chest X-ray is recommended in 3-4 weeks following trial of antibiotic therapy to ensure resolution and exclude underlying malignancy. Electronically Signed   By: Garald Balding M.D.   On: 03/05/2017 00:41    Assessment and Plan:   1. Tachycardia with RBBB morphology - I have put in a call for EP to review. Tommye Standard PA-C will review with EP MD at [facility]. His EKG is somewhat atypical appearing, not clearly atrial flutter to me. He also seems to go into a rate-related RBBB with this. I reviewed telemetry and rhythm does not break ever from fixed rate to assess for any interim flutter waves. Prior history of SVT raises question of recurrence as well. He converted to NSR overnight with resolution of symptoms. Check TSH, Mg. Primary team is repleting potassium. Would aim for goal K 4 or greater, Mg 2 or greater. Agree with echo. Continue present regimen. When in sinus, he has chronic sinus bradycardia with HR 50s-60s. There was  also brief evidence of Wenckebach phenomena this AM without significant pause but this would indicate we need to be cautious when considering AVN blocker titration. Will also review with Dr. Bronson Ing.  2. History of baseline sinus bradycardia and 1st degree RBBB, with brief prolonging of PR interval this AM suggestive of Wenckebach phenomena - as above.  3. Elevated troponin in the setting of the above including CAD s/p remote stenting, CABG 2013 with last cath 2016 - minimally elevated troponin on 3rd value. Last cath showed progression of disease but no clear targets for PCI. I suspect troponin elevation is secondary due to the above superimposed on known CAD. Would continue to cycle and await echo. Continue aspirin. Remains on heparin at this time as well.  4. Suspected chronic diastolic CHF - appears euvolemic. Discussed sodium restriction. He does not actively avoid high sodium foods.  5. HTN - controlled.  6. Hyperlipidemia - f/u lipids in AM. Last values on file in Epic were in 2012. Continue atorvastatin.  Signed, Charlie Pitter, PA-C  03/05/2017 8:33 AM   The patient was seen and examined, and I agree with the history, physical exam, assessment and plan as documented above, with modifications as noted below. I have also personally reviewed all relevant documentation, old records, labs, and both radiographic and cardiovascular studies. I have also independently interpreted old and new ECG's.  76 yr old male with CAD and CABG with h/o SVT presented to ED with upper left-sided chest tightness. He had eaten  a grilled chicken sandwich, attended a visitation, and then went to [place] and began experiencing symptoms. He took 2 SL nitro about 10 minutes apart and symptoms did not subside. He called his niece who is a nurse who came and checked vitals and HR was >120 bpm.  In ED, he was found to be tachycardic with a RBBB and PVC's. The admitting hospitalist felt this was atrial flutter and  started IV heparin and cycled troponins, which have peaked at 0.07.  During my evaluation this morning, he was without symptoms and is currently in sinus rhythm.  I reviewed admission ECG and telemetry strips. There appears to be a retrograde P wave in V1 which would suggest SVT (short RP tachycardia, likely AVNRT). Upon review of how the tachycardia terminated, this would also be consistent with this. I did not see any demonstrable flutter waves.  I reviewed the ECG and telemetry strips with EP (Dr. Curt Bears) as well, and he also felt this was indeed SVT.  As stated above, when in sinus he has sinus bradycardia with Wenckebach seen earlier today.  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 I will stop IV heparin. Given sinus bradycardia, I am unable to increase dosage of diltiazem or metoprolol. Await echocardiogram to assess for change in LV systolic function and regional wall motion. He will need close EP follow up with consideration for EP study and ablation of AVNRT.   Kate Sable, MD, [facility]  03/05/2017 10:31 AM

## 2017-03-05 NOTE — Progress Notes (Signed)
CRITICAL VALUE ALERT  Critical Value: troponin 0.04  Date & Time Notied: 03-05-17 0615  Provider Notified:lama, gagan  Orders Received/Actions taken: none.  Troponin was slightly elevated from earlier, no orders given.  Patient lying in bed asleep with no c/o pain.

## 2017-03-05 NOTE — Progress Notes (Signed)
Subjective: He came to the hospital because of chest discomfort. He took nitroglycerin which did not help. When he came to the emergency room he was having cardiac arrhythmia and spontaneously converted. This was called atrial flutter but may be a more complex arrhythmia. He's not having any chest pain now. His troponin level however is elevated. At 0.4 no other new complaints.  Objective: Vital signs in last 24 hours: Temp:  [97.6 F (36.4 C)] 97.6 F (36.4 C) (10/23 0000) Pulse Rate:  [65-119] 65 (10/23 0300) Resp:  [13-18] 17 (10/23 0300) BP: (101-122)/(74-87) 120/74 (10/23 0300) SpO2:  [93 %-98 %] 97 % (10/23 0300) Weight:  [85.7 kg (189 lb)] 85.7 kg (189 lb) (10/22 2357) Weight change:  Last BM Date: 03/04/17  Intake/Output from previous day: 10/22 0701 - 10/23 0700 In: 539.7 [I.V.:39.7; IV Piggyback:500] Out: -   PHYSICAL EXAM General appearance: alert, cooperative and no distress Resp: clear to auscultation bilaterally Cardio: regular rate and rhythm, S1, S2 normal, no murmur, click, rub or gallop GI: soft, non-tender; bowel sounds normal; no masses,  no organomegaly Extremities: extremities normal, atraumatic, no cyanosis or edema Skin warm and dry.  Lab Results:  Results for orders placed or performed during the hospital encounter of 03/04/17 (from the past 48 hour(s))  Basic metabolic panel     Status: Abnormal   Collection Time: 03/05/17 12:02 AM  Result Value Ref Range   Sodium 138 135 - 145 mmol/L   Potassium 3.8 3.5 - 5.1 mmol/L   Chloride 106 101 - 111 mmol/L   CO2 22 22 - 32 mmol/L   Glucose, Bld 112 (H) 65 - 99 mg/dL   BUN 24 (H) 6 - 20 mg/dL   Creatinine, Ser 1.05 0.61 - 1.24 mg/dL   Calcium 9.2 8.9 - 10.3 mg/dL   GFR calc non Af Amer >60 >60 mL/min   GFR calc Af Amer >60 >60 mL/min    Comment: (NOTE) The eGFR has been calculated using the CKD EPI equation. This calculation has not been validated in all clinical situations. eGFR's persistently <60  mL/min signify possible Chronic Kidney Disease.    Anion gap 10 5 - 15  CBC     Status: Abnormal   Collection Time: 03/05/17 12:02 AM  Result Value Ref Range   WBC 12.3 (H) 4.0 - 10.5 K/uL   RBC 5.11 4.22 - 5.81 MIL/uL   Hemoglobin 14.8 13.0 - 17.0 g/dL   HCT 43.9 39.0 - 52.0 %   MCV 85.9 78.0 - 100.0 fL   MCH 29.0 26.0 - 34.0 pg   MCHC 33.7 30.0 - 36.0 g/dL   RDW 15.1 11.5 - 15.5 %   Platelets 231 150 - 400 K/uL  Brain natriuretic peptide     Status: Abnormal   Collection Time: 03/05/17 12:02 AM  Result Value Ref Range   B Natriuretic Peptide 318.0 (H) 0.0 - 100.0 pg/mL  D-dimer, quantitative (not at [facility])     Status: None   Collection Time: 03/05/17 12:02 AM  Result Value Ref Range   D-Dimer, Quant 0.30 0.00 - 0.50 ug/mL-FEU    Comment: (NOTE) At the manufacturer cut-off of 0.50 ug/mL FEU, this assay has been documented to exclude PE with a sensitivity and negative predictive value of 97 to 99%.  At this time, this assay has not been approved by the FDA to exclude DVT/VTE. Results should be correlated with clinical presentation.   Troponin I     Status: None   Collection  Time: 03/05/17 12:02 AM  Result Value Ref Range   Troponin I <0.03 <0.03 ng/mL  I-stat troponin, ED     Status: None   Collection Time: 03/05/17 12:34 AM  Result Value Ref Range   Troponin i, poc 0.01 0.00 - 0.08 ng/mL   Comment 3            Comment: Due to the release kinetics of cTnI, a negative result within the first hours of the onset of symptoms does not rule out myocardial infarction with certainty. If myocardial infarction is still suspected, repeat the test at appropriate intervals.   APTT     Status: Abnormal   Collection Time: 03/05/17  2:49 AM  Result Value Ref Range   aPTT 37 (H) 24 - 36 seconds    Comment:        IF BASELINE aPTT IS ELEVATED, SUGGEST PATIENT RISK ASSESSMENT BE USED TO DETERMINE APPROPRIATE ANTICOAGULANT THERAPY.   Protime-INR     Status: None   Collection  Time: 03/05/17  2:49 AM  Result Value Ref Range   Prothrombin Time 14.4 11.4 - 15.2 seconds   INR 1.13   CBC     Status: Abnormal   Collection Time: 03/05/17  4:04 AM  Result Value Ref Range   WBC 11.1 (H) 4.0 - 10.5 K/uL   RBC 4.88 4.22 - 5.81 MIL/uL   Hemoglobin 13.8 13.0 - 17.0 g/dL   HCT 42.3 39.0 - 52.0 %   MCV 86.7 78.0 - 100.0 fL   MCH 28.3 26.0 - 34.0 pg   MCHC 32.6 30.0 - 36.0 g/dL   RDW 15.3 11.5 - 15.5 %   Platelets 237 150 - 400 K/uL  Comprehensive metabolic panel     Status: Abnormal   Collection Time: 03/05/17  4:04 AM  Result Value Ref Range   Sodium 139 135 - 145 mmol/L   Potassium 3.5 3.5 - 5.1 mmol/L   Chloride 106 101 - 111 mmol/L   CO2 23 22 - 32 mmol/L   Glucose, Bld 109 (H) 65 - 99 mg/dL   BUN 23 (H) 6 - 20 mg/dL   Creatinine, Ser 0.97 0.61 - 1.24 mg/dL   Calcium 8.6 (L) 8.9 - 10.3 mg/dL   Total Protein 6.2 (L) 6.5 - 8.1 g/dL   Albumin 3.7 3.5 - 5.0 g/dL   AST 15 15 - 41 U/L   ALT 15 (L) 17 - 63 U/L   Alkaline Phosphatase 75 38 - 126 U/L   Total Bilirubin 0.6 0.3 - 1.2 mg/dL   GFR calc non Af Amer >60 >60 mL/min   GFR calc Af Amer >60 >60 mL/min    Comment: (NOTE) The eGFR has been calculated using the CKD EPI equation. This calculation has not been validated in all clinical situations. eGFR's persistently <60 mL/min signify possible Chronic Kidney Disease.    Anion gap 10 5 - 15  Troponin I (q 6hr x 3)     Status: Abnormal   Collection Time: 03/05/17  4:04 AM  Result Value Ref Range   Troponin I 0.04 (HH) <0.03 ng/mL    Comment: CRITICAL RESULT CALLED TO, READ BACK BY AND VERIFIED WITH: LEFLORE,C AT 6:00AM ON 03/05/17 BY FESTERMAN,C     ABGS No results for input(s): PHART, PO2ART, TCO2, HCO3 in the last 72 hours.  Invalid input(s): PCO2 CULTURES No results found for this or any previous visit (from the past 240 hour(s)). Studies/Results: Dg Chest Port 1 View  Result Date:  03/05/2017 CLINICAL DATA:  Acute onset of mid chest pain and  shortness of breath. Initial encounter. EXAM: PORTABLE CHEST 1 VIEW COMPARISON:  Chest radiograph performed 05/05/2012 FINDINGS: The lungs are well-aerated. Left basilar airspace opacity raises concern for pneumonia. There is no evidence of pleural effusion or pneumothorax. The cardiomediastinal silhouette is borderline normal in size. No acute osseous abnormalities are seen. IMPRESSION: Left basilar airspace opacity raises concern for pneumonia. Followup PA and lateral chest X-ray is recommended in 3-4 weeks following trial of antibiotic therapy to ensure resolution and exclude underlying malignancy. Electronically Signed   By: Roanna Raider M.D.   On: 03/05/2017 00:41    Medications:  Prior to Admission:  Prescriptions Prior to Admission  Medication Sig Dispense Refill Last Dose  . aspirin EC 81 MG tablet Take 81 mg by mouth daily.   Past Week at Unknown time  . atorvastatin (LIPITOR) 40 MG tablet TAKE 1 TABLET BY MOUTH AT BEDTIME 90 tablet 6 Past Week at Unknown time  . diltiazem (CARDIZEM CD) 240 MG 24 hr capsule TAKE 1 CAPSULE BY MOUTH EVERY DAY 30 capsule 6 Past Week at Unknown time  . furosemide (LASIX) 20 MG tablet TAKE 1 TABLET BY MOUTH AS NEEDED FOR LEG SWELLING 90 tablet 3 Past Week at Unknown time  . metoprolol succinate (TOPROL-XL) 50 MG 24 hr tablet Take 1 tablet (50 mg total) by mouth daily. 30 tablet 6 Past Week at Unknown time  . Multiple Vitamin (MULTIVITAMIN) tablet Take 1 tablet by mouth at bedtime.    Past Week at Unknown time  . naproxen (NAPROSYN) 500 MG tablet Take 500 mg by mouth 2 (two) times daily with a meal.   Past Week at Unknown time  . nitroGLYCERIN (NITROSTAT) 0.4 MG SL tablet Place 1 tablet (0.4 mg total) under the tongue every 5 (five) minutes as needed for chest pain. 25 tablet 3 03/04/2017 at Unknown time  . tadalafil (CIALIS) 5 MG tablet Take 5 mg by mouth as needed for erectile dysfunction.   Past Week at Unknown time  . Tamsulosin HCl (FLOMAX) 0.4 MG CAPS Take  0.4 mg by mouth daily.    Past Week at Unknown time  . valACYclovir (VALTREX) 500 MG tablet Take 500 mg by mouth as needed.   4 Past Month at Unknown time   Scheduled: . aspirin EC  81 mg Oral Daily  . atorvastatin  40 mg Oral QHS  . diltiazem  240 mg Oral Daily  . gabapentin  300 mg Oral Daily  . [START ON 03/06/2017] Influenza vac split quadrivalent PF  0.5 mL Intramuscular Tomorrow-1000  . metoprolol succinate  50 mg Oral Daily  . potassium chloride  20 mEq Oral BID  . sodium chloride flush  3 mL Intravenous Q12H  . tamsulosin  0.4 mg Oral Daily   Continuous: . sodium chloride    . heparin 1,300 Units/hr (03/05/17 0257)   MBA:BQWFOA chloride, acetaminophen **OR** acetaminophen, sodium chloride flush  Assesment:He was admitted with chest pain and cardiac arrhythmia. He is known to have severe coronary disease and has had bypass grafting but since that time has had another evaluation that showed severe coronary disease with no intervention possible. He has cardiac arrhythmias and is not quite clear what this is. He has hypertension which is well controlled. He has significant issues with his prostate. Active Problems:   Chest pain    Plan: Cardiology consultation has been requested and is underway. Check echocardiogram.    LOS: 0  days   Barnett Elzey L 03/05/2017, 9:01 AM

## 2017-03-05 NOTE — ED Provider Notes (Signed)
[facility] EMERGENCY DEPARTMENT Provider Note   CSN: [id number] Arrival date & time: 03/04/17  2349     History   Chief Complaint Chief Complaint  Patient presents with  . Chest Pain    HPI Eric Beltran is a 76 y.o. male.  Patient With history of CAD status post CABG, status post aortic aneurysm repair in 2013 presenting with central chest tightness that started about 7:30 PM while he was resting.  Pain does not radiate.  Associated with some shortness of breath and weakness.  Took 2 nitroglycerin at home with no significant change.  Reports that the tightness is just about gone at this time.  Tightness reminds him of his previous MI.  States he has not had any stents since his bypass in 2013.  Denies any dizziness or lightheadedness.  Denies any abdominal pain or vomiting.  Found to be tachycardic on arrival.  Does have history of SVT but states compliance with his metoprolol and diltiazem.   The history is provided by the patient and a relative.  Chest Pain   Associated symptoms include shortness of breath. Pertinent negatives include no abdominal pain, no cough, no dizziness, no fever, no nausea, no vomiting and no weakness.    Past Medical History:  Diagnosis Date  . Ascending aortic aneurysm [facility])    Status post repair 12/2011 at [facility]  . Atrial fibrillation (HCC)    Perioperative  . BPH (benign prostatic hyperplasia)   . Coronary atherosclerosis of native coronary artery    Multivessel status post prior stenting and ultimately CABG 12/2011 at [facility]  . Essential hypertension, benign   . Mixed hyperlipidemia   . Myocardial infarction ([place])   . Supraventricular tachycardia [facility])     Patient Active Problem List   Diagnosis Date Noted  . Osteoarthritis of right hip 02/07/2016  . Status post total replacement of right hip 02/07/2016  . Abnormal cardiovascular function study   . Abnormal myocardial perfusion study 06/09/2014  . Cardiac murmur 05/21/2014  .  Aneurysm of thoracic aorta ([place]) 04/13/2010  . Mixed hyperlipidemia 07/20/2009  . Essential hypertension, benign 07/20/2009  . Coronary atherosclerosis of native coronary artery 07/20/2009    Past Surgical History:  Procedure Laterality Date  . ASCENDING AORTIC ANEURYSM REPAIR  12/2011 [facility]   26 mm.Dacron graft  . CORONARY ARTERY BYPASS GRAFT  12/2011 [facility]   LIMA to LAD, SVG to PLB, SVG to ramus, SVG to OM  . HERNIA REPAIR    . LEFT HEART CATHETERIZATION WITH CORONARY ANGIOGRAM N/A 06/18/2014   Procedure: LEFT HEART CATHETERIZATION WITH CORONARY ANGIOGRAM;  Surgeon: Blane Ohara, MD;  Location: [facility] CATH LAB;  Service: Cardiovascular;  Laterality: N/A;  . TOTAL HIP ARTHROPLASTY Right 02/07/2016   Procedure: RIGHT TOTAL HIP ARTHROPLASTY ANTERIOR APPROACH;  Surgeon: Mcarthur Rossetti, MD;  Location: [place];  Service: Orthopedics;  Laterality: Right;  . TRANSURETHRAL RESECTION OF PROSTATE         Home Medications    Prior to Admission medications   Medication Sig Start Date End Date Taking? Authorizing Provider  aspirin EC 81 MG tablet Take 81 mg by mouth daily.    [provider]  atorvastatin (LIPITOR) 40 MG tablet TAKE 1 TABLET BY MOUTH AT BEDTIME 10/15/16   Satira Sark, MD  diltiazem (CARDIZEM CD) 240 MG 24 hr capsule TAKE 1 CAPSULE BY MOUTH EVERY DAY 10/15/16   Satira Sark, MD  furosemide (LASIX) 20 MG tablet TAKE 1 TABLET BY MOUTH  AS NEEDED FOR LEG SWELLING 08/27/16   Satira Sark, MD  gabapentin (NEURONTIN) 300 MG capsule Take 300 mg by mouth daily.    [provider]  metoprolol succinate (TOPROL-XL) 50 MG 24 hr tablet Take 1 tablet (50 mg total) by mouth daily. 10/13/14   Satira Sark, MD  Multiple Vitamin (MULTIVITAMIN) tablet Take 1 tablet by mouth at bedtime.     [provider]  nitroGLYCERIN (NITROSTAT) 0.4 MG SL tablet Place 1 tablet (0.4 mg total) under the tongue every 5 (five) minutes as needed for chest pain. 06/22/16    Satira Sark, MD  Omega-3 Fatty Acids (FISH OIL) 1000 MG CAPS Take 1 capsule by mouth daily.      [provider]  tadalafil (CIALIS) 5 MG tablet Take 5 mg by mouth as needed for erectile dysfunction.    [provider]  Tamsulosin HCl (FLOMAX) 0.4 MG CAPS Take 0.4 mg by mouth daily.     [provider]    Family History Family History  Problem Relation Age of Onset  . Hypertension Father   . Parkinson's disease Father   . Hypertension Mother   . Lung cancer Sister     Social History Social History  Substance Use Topics  . Smoking status: Never Smoker  . Smokeless tobacco: Never Used     Comment: tobacco use - no  . Alcohol use 0.0 oz/week     Comment: Occasional     Allergies   Patient has no known allergies.   Review of Systems Review of Systems  Constitutional: Negative for activity change, appetite change and fever.  HENT: Negative for congestion and rhinorrhea.   Eyes: Negative for visual disturbance.  Respiratory: Positive for chest tightness and shortness of breath. Negative for cough.   Cardiovascular: Positive for chest pain.  Gastrointestinal: Negative for abdominal pain, nausea and vomiting.  Endocrine: Negative for polyuria.  Genitourinary: Negative for dysuria, hematuria, testicular pain and urgency.  Musculoskeletal: Negative for arthralgias and myalgias.  Skin: Negative for rash.  Neurological: Positive for light-headedness. Negative for dizziness and weakness.    all other systems are negative except as noted in the HPI and PMH.    Physical Exam Updated Vital Signs BP 122/87 (BP Location: Left Arm) Comment: Simultaneous filing. User may not have seen previous data.  Pulse (!) 119 Comment: Simultaneous filing. User may not have seen previous data.  Temp 97.6 F (36.4 C) (Oral)   Resp 18 Comment: Simultaneous filing. User may not have seen previous data.  Ht 5\' 7"  (1.702 m)   Wt 85.7 kg (189 lb)   SpO2 95%  Comment: Simultaneous filing. User may not have seen previous data.  BMI 29.60 kg/m   Physical Exam  Constitutional: He is oriented to person, place, and time. He appears well-developed and well-nourished. No distress.  HENT:  Head: Normocephalic and atraumatic.  Mouth/Throat: Oropharynx is clear and moist. No oropharyngeal exudate.  Eyes: Pupils are equal, round, and reactive to light. Conjunctivae and EOM are normal.  Neck: Normal range of motion. Neck supple.  No meningismus.  Cardiovascular: Normal rate, regular rhythm, normal heart sounds and intact distal pulses.   No murmur heard. Regular wide complex tachycardia  Pulmonary/Chest: Effort normal and breath sounds normal. No respiratory distress.  Abdominal: Soft. There is no tenderness. There is no rebound and no guarding.  Musculoskeletal: Normal range of motion. He exhibits no edema or tenderness.  Neurological: He is alert and oriented to person,  place, and time. No cranial nerve deficit. He exhibits normal muscle tone. Coordination normal.   5/5 strength throughout. CN 2-12 intact.Equal grip strength.   Skin: Skin is warm.  Psychiatric: He has a normal mood and affect. His behavior is normal.  Nursing note and vitals reviewed.    ED Treatments / Results  Labs (all labs ordered are listed, but only abnormal results are displayed) Labs Reviewed  BASIC METABOLIC PANEL - Abnormal; Notable for the following:       Result Value   Glucose, Bld 112 (*)    BUN 24 (*)    All other components within normal limits  CBC - Abnormal; Notable for the following:    WBC 12.3 (*)    All other components within normal limits  BRAIN NATRIURETIC PEPTIDE - Abnormal; Notable for the following:    B Natriuretic Peptide 318.0 (*)    All other components within normal limits  D-DIMER, QUANTITATIVE (NOT AT [facility]  TROPONIN I  APTT  PROTIME-INR  I-STAT TROPONIN, ED  I-STAT TROPONIN, ED    EKG  EKG Interpretation  Date/Time:  Monday  March 04 2017 23:58:17 EDT Ventricular Rate:  120 PR Interval:    QRS Duration: 164 QT Interval:  390 QTC Calculation: 552 R Axis:   68 Text Interpretation:  Sinus tachycardia Multiple ventricular premature complexes Right bundle branch block new RBBB new widened QRS Confirmed by Ezequiel Essex [phone]) on 03/05/2017 12:06:58 AM       Radiology Dg Chest Port 1 View  Result Date: 03/05/2017 CLINICAL DATA:  Acute onset of mid chest pain and shortness of breath. Initial encounter. EXAM: PORTABLE CHEST 1 VIEW COMPARISON:  Chest radiograph performed 05/05/2012 FINDINGS: The lungs are well-aerated. Left basilar airspace opacity raises concern for pneumonia. There is no evidence of pleural effusion or pneumothorax. The cardiomediastinal silhouette is borderline normal in size. No acute osseous abnormalities are seen. IMPRESSION: Left basilar airspace opacity raises concern for pneumonia. Followup PA and lateral chest X-ray is recommended in 3-4 weeks following trial of antibiotic therapy to ensure resolution and exclude underlying malignancy. Electronically Signed   By: Garald Balding M.D.   On: 03/05/2017 00:41    Procedures Procedures (including critical care time)  Medications Ordered in ED Medications  metoprolol tartrate (LOPRESSOR) injection 2.5 mg (not administered)     Initial Impression / Assessment and Plan / ED Course  I have reviewed the triage vital signs and the nursing notes.  Pertinent labs & imaging results that were available during my care of the patient were reviewed by me and considered in my medical decision making (see chart for details).    Patient with known history of CAD as well as SVT presenting with central chest tightness that has been constant for the past 5 hours.  Now improving.  EKG shows new wide QRS with right bundle branch block.  Suspect patient has underlying atrial flutter causing his new EKG changes.  Given IV Lopressor without much change.   Discussed with Dr. Radford Pax of cardiology who agrees this is likely atrial flutter.  Recommends additional beta-blocker and small fluid bolus.  Patient may also benefit from amiodarone.  Would not use Cardizem given his borderline blood pressures.  Troponin negative.  Low suspicion for ACS with ongoing chest pain and tightness for the past 5 hours.  D-dimer is negative.  Patient did spontaneously convert to sinus rhythm after 2 doses of metoprolol and IV fluids.  Cardizem was not started. Heparin gtt started.  Chest pain has resolved. BP and mental status stable. Observation admission d/w Dr. Darrick Meigs for cardiology evaluation in the morning.  This patients CHA2DS2-VASc Score and unadjusted Ischemic Stroke Rate (% per year) is equal to 4.8 % stroke rate/year from a score of 4  Above score calculated as 1 point each if present [CHF, HTN, DM, Vascular=MI/PAD/Aortic Plaque, Age if 65-74, or Male] Above score calculated as 2 points each if present [Age > 75, or Stroke/TIA/TE]      CRITICAL CARE Performed by: Ezequiel Essex Total critical care time: 60 minutes Critical care time was exclusive of separately billable procedures and treating other patients. Critical care was necessary to treat or prevent imminent or life-threatening deterioration. Critical care was time spent personally by me on the following activities: development of treatment plan with patient and/or surrogate as well as nursing, discussions with consultants, evaluation of patient's response to treatment, examination of patient, obtaining history from patient or surrogate, ordering and performing treatments and interventions, ordering and review of laboratory studies, ordering and review of radiographic studies, pulse oximetry and re-evaluation of patient's condition.   Final Clinical Impressions(s) / ED Diagnoses   Final diagnoses:  Atrial flutter with rapid ventricular response ([place])  Chest pain, unspecified type    New  Prescriptions New Prescriptions   No medications on file     Ezequiel Essex, MD 03/05/17 [phone]

## 2017-03-05 NOTE — Progress Notes (Signed)
ANTICOAGULATION CONSULT NOTE - Preliminary  Pharmacy Consult for heparin Indication: atrial fibrillation  No Known Allergies  Patient Measurements: Height: 5\' 7"  (170.2 cm) Weight: 189 lb (85.7 kg) IBW/kg (Calculated) : 66.1 HEPARIN DW (KG): 83.6   Vital Signs: Temp: 97.6 F (36.4 C) (10/23 0000) Temp Source: Oral (10/23 0000) BP: 101/79 (10/23 0200) Pulse Rate: 69 (10/23 0215)  Labs:  Recent Labs  03/05/17 0002  HGB 14.8  HCT 43.9  PLT 231  CREATININE 1.05  TROPONINI <0.03   Estimated Creatinine Clearance: 63.5 mL/min (by C-G formula based on SCr of 1.05 mg/dL).  Medical History: Past Medical History:  Diagnosis Date  . Ascending aortic aneurysm [facility])    Status post repair 12/2011 at [facility]  . Atrial fibrillation (HCC)    Perioperative  . BPH (benign prostatic hyperplasia)   . Coronary atherosclerosis of native coronary artery    Multivessel status post prior stenting and ultimately CABG 12/2011 at [facility]  . Essential hypertension, benign   . Mixed hyperlipidemia   . Myocardial infarction ([place])   . Supraventricular tachycardia (HCC)     Medications:  Scheduled:  . heparin  4,500 Units Intravenous Once   Infusions:  . heparin     PRN:   Assessment: 76 yo male with hx CAD s/p CABG, s/p aortic aneurysm in 2013.  In ED with chest tightness.  EKG showed new wide QRS with right bundle branch block.  Starting heparin for a. Fib.   Goal of Therapy:  Heparin level 0.3-0.7 units/ml   Plan:  Give 4500 units bolus x 1 Start heparin infusion at 1300 units/hr Check anti-Xa level in 6 hours and daily while on heparin Continue to monitor H&H and platelets Preliminary review of pertinent patient information completed.  Forestine Na clinical pharmacist will complete review during morning rounds to assess the patient and finalize treatment regimen.  Damere Brandenburg Scarlett, RPH 03/05/2017,2:42 AM

## 2017-03-05 NOTE — Progress Notes (Signed)
ANTICOAGULATION CONSULT NOTE - Preliminary  Pharmacy Consult for heparin Indication: atrial fibrillation  Allergies  Allergen Reactions  . Other     Patient Measurements: Height: 5\' 7"  (170.2 cm) Weight: 189 lb (85.7 kg) IBW/kg (Calculated) : 66.1 HEPARIN DW (KG): 83.6   Vital Signs: Temp: 97.6 F (36.4 C) (10/23 0000) Temp Source: Oral (10/23 0000) BP: 120/74 (10/23 0300) Pulse Rate: 65 (10/23 0300)  Labs:  Recent Labs  03/05/17 0002 03/05/17 0249 03/05/17 0404 03/05/17 0917  HGB 14.8  --  13.8  --   HCT 43.9  --  42.3  --   PLT 231  --  237  --   APTT  --  37*  --   --   LABPROT  --  14.4  --   --   INR  --  1.13  --   --   HEPARINUNFRC  --   --   --  0.58  CREATININE 1.05  --  0.97  --   TROPONINI <0.03  --  0.04* 0.07*   Estimated Creatinine Clearance: 68.8 mL/min (by C-G formula based on SCr of 0.97 mg/dL).  Medical History: Past Medical History:  Diagnosis Date  . Ascending aortic aneurysm [facility])    Status post repair 12/2011 at [facility]  . Atrial fibrillation (HCC)    Perioperative  . BPH (benign prostatic hyperplasia)   . Chronic diastolic CHF (congestive heart failure) ([place])   . Chronic shortness of breath   . Coronary atherosclerosis of native coronary artery    a. Multivessel status post prior stenting and ultimately CABG 12/2011 at [facility] with with LIMA to LAD, SVG to PLB, SVG to ramus, SVG to OM . b. Last cath 06/2014 (following ischemic nuc) -> medical therapy, no feasible way to revascularize LCx territory.  . Essential hypertension, benign   . Mixed hyperlipidemia   . Myocardial infarction ([place])   . Supraventricular tachycardia (HCC)     Medications:  Scheduled:  . aspirin EC  81 mg Oral Daily  . atorvastatin  40 mg Oral QHS  . diltiazem  240 mg Oral Daily  . gabapentin  300 mg Oral Daily  . [START ON 03/06/2017] Influenza vac split quadrivalent PF  0.5 mL Intramuscular Tomorrow-1000  . metoprolol succinate  50 mg Oral Daily  . potassium  chloride  20 mEq Oral BID  . sodium chloride flush  3 mL Intravenous Q12H  . tamsulosin  0.4 mg Oral Daily   Infusions:  . sodium chloride     PRN:   Assessment: 76 yo male with hx CAD s/p CABG, s/p aortic aneurysm in 2013.  In ED with chest tightness.  EKG showed new wide QRS with right bundle branch block.  Starting heparin for afib. Heparin level therapeutic  Goal of Therapy:  Heparin level 0.3-0.7 units/ml   Plan:  Continue heparin infusion at 1300 units/hr Check anti-Xa level in 6 hours and daily while on heparin Continue to monitor H&H and platelets  Isac Sarna, BS Vena Austria, BCPS Clinical Pharmacist Pager [phone] 03/05/2017,10:59 AM

## 2017-03-06 ENCOUNTER — Observation Stay (HOSPITAL_BASED_OUTPATIENT_CLINIC_OR_DEPARTMENT_OTHER): Payer: Medicare Other

## 2017-03-06 DIAGNOSIS — I499 Cardiac arrhythmia, unspecified: Secondary | ICD-10-CM | POA: Diagnosis not present

## 2017-03-06 DIAGNOSIS — I351 Nonrheumatic aortic (valve) insufficiency: Secondary | ICD-10-CM

## 2017-03-06 DIAGNOSIS — I248 Other forms of acute ischemic heart disease: Secondary | ICD-10-CM | POA: Diagnosis not present

## 2017-03-06 DIAGNOSIS — E782 Mixed hyperlipidemia: Secondary | ICD-10-CM | POA: Diagnosis not present

## 2017-03-06 DIAGNOSIS — N138 Other obstructive and reflux uropathy: Secondary | ICD-10-CM | POA: Diagnosis present

## 2017-03-06 DIAGNOSIS — I10 Essential (primary) hypertension: Secondary | ICD-10-CM | POA: Diagnosis not present

## 2017-03-06 DIAGNOSIS — N401 Enlarged prostate with lower urinary tract symptoms: Secondary | ICD-10-CM

## 2017-03-06 DIAGNOSIS — R079 Chest pain, unspecified: Secondary | ICD-10-CM | POA: Diagnosis not present

## 2017-03-06 DIAGNOSIS — R000 Tachycardia, unspecified: Secondary | ICD-10-CM | POA: Diagnosis not present

## 2017-03-06 LAB — ECHOCARDIOGRAM COMPLETE
CHL CUP DOP CALC LVOT VTI: 22.2 cm
E/e' ratio: 12.25
EWDT: 239 ms
FS: 35 % (ref 28–44)
Height: 67 in
IV/PV OW: 1.21
LA ID, A-P, ES: 42 mm
LA diam end sys: 42 mm
LA diam index: 2.06 cm/m2
LA vol A4C: 125 ml
LA vol index: 50.1 mL/m2
LA vol: 102 mL
LDCA: 3.46 cm2
LV E/e' medial: 12.25
LV TDI E'LATERAL: 7.72
LV dias vol index: 49 mL/m2
LV dias vol: 100 mL (ref 62–150)
LV sys vol: 38 mL
LVEEAVG: 12.25
LVELAT: 7.72 cm/s
LVOT diameter: 21 mm
LVOT peak grad rest: 4 mmHg
LVOT peak vel: 95.3 cm/s
LVOTSV: 77 mL
LVSYSVOLIN: 19 mL/m2
MV Dec: 239
MV pk A vel: 75.7 m/s
MVPG: 4 mmHg
MVPKEVEL: 94.6 m/s
PW: 13.5 mm — AB (ref 0.6–1.1)
RV LATERAL S' VELOCITY: 11.9 cm/s
RV TAPSE: 14.7 mm
Simpson's disk: 62
Stroke v: 62 ml
TDI e' medial: 4.79
Weight: 3024 oz

## 2017-03-06 LAB — LIPID PANEL
Cholesterol: 130 mg/dL (ref 0–200)
HDL: 28 mg/dL — AB (ref >40)
LDL CALC: 50 mg/dL (ref 0–99)
TRIGLYCERIDES: 262 mg/dL — AB (ref <150)
Total CHOL/HDL Ratio: 4.6 RATIO
VLDL: 52 mg/dL — ABNORMAL HIGH (ref 0–40)

## 2017-03-06 LAB — BASIC METABOLIC PANEL
ANION GAP: 8 (ref 5–15)
BUN: 18 mg/dL (ref 6–20)
CHLORIDE: 106 mmol/L (ref 101–111)
CO2: 25 mmol/L (ref 22–32)
Calcium: 8.7 mg/dL — ABNORMAL LOW (ref 8.9–10.3)
Creatinine, Ser: 0.98 mg/dL (ref 0.61–1.24)
GFR calc Af Amer: >60 mL/min (ref >60)
GFR calc non Af Amer: >60 mL/min (ref >60)
GLUCOSE: 112 mg/dL — AB (ref 65–99)
POTASSIUM: 3.9 mmol/L (ref 3.5–5.1)
Sodium: 139 mmol/L (ref 135–145)

## 2017-03-06 LAB — MAGNESIUM: Magnesium: 2.1 mg/dL (ref 1.7–2.4)

## 2017-03-06 NOTE — Discharge Summary (Signed)
Physician Discharge Summary  Patient ID: Eric Beltran MRN: [id number] DOB/AGE: 1940/09/10 76 y.o. Primary Care Physician:Virat Prather, Percell Miller, MD Admit date: 03/04/2017 Discharge date: 03/06/2017    Discharge Diagnoses:   Active Problems:   Mixed hyperlipidemia   Essential hypertension, benign   Coronary atherosclerosis of native coronary artery   Aneurysm of thoracic aorta (HCC)   Chest pain   BPH with obstruction/lower urinary tract symptoms Supraventricular tachycardia    Discharged Condition:Improved    Consults: Cardiology  Significant Diagnostic Studies: Dg Chest Port 1 View  Result Date: 03/05/2017 CLINICAL DATA:  Acute onset of mid chest pain and shortness of breath. Initial encounter. EXAM: PORTABLE CHEST 1 VIEW COMPARISON:  Chest radiograph performed 05/05/2012 FINDINGS: The lungs are well-aerated. Left basilar airspace opacity raises concern for pneumonia. There is no evidence of pleural effusion or pneumothorax. The cardiomediastinal silhouette is borderline normal in size. No acute osseous abnormalities are seen. IMPRESSION: Left basilar airspace opacity raises concern for pneumonia. Followup PA and lateral chest X-ray is recommended in 3-4 weeks following trial of antibiotic therapy to ensure resolution and exclude underlying malignancy. Electronically Signed   By: Garald Balding M.D.   On: 03/05/2017 00:41    Lab Results: Basic Metabolic Panel:  Recent Labs  03/05/17 0404 03/05/17 0917 03/06/17 0607  NA 139  --  139  K 3.5  --  3.9  CL 106  --  106  CO2 23  --  25  GLUCOSE 109*  --  112*  BUN 23*  --  18  CREATININE 0.97  --  0.98  CALCIUM 8.6*  --  8.7*  MG  --  1.9 2.1   Liver Function Tests:  Recent Labs  03/05/17 0404  AST 15  ALT 15*  ALKPHOS 75  BILITOT 0.6  PROT 6.2*  ALBUMIN 3.7     CBC:  Recent Labs  03/05/17 0002 03/05/17 0404  WBC 12.3* 11.1*  HGB 14.8 13.8  HCT 43.9 42.3  MCV 85.9 86.7  PLT 231 237    No results  found for this or any previous visit (from the past 240 hour(s)).   Hospital Course: This is a 76 year old who came to the emergency department after having chest discomfort. He is known to have severe cardiac disease. He had cardiac arrhythmias and this was initially thought to be atrial flutter but on further review looks more like supraventricular tachycardia. His chest pain resolved. Cardiology consultation was obtained and echocardiogram was suggested but it was also felt that he will need close follow-up with electrophysiologist. Chest x-ray done in the emergency department had some concern for pneumonia in the left lower lobe but he doesn't really have an elevated white blood cell count has not had cough or congestion and no fever so I think that's unlikely  Discharge Exam: Blood pressure 140/72, pulse 61, temperature 98.4 F (36.9 C), temperature source Oral, resp. rate 18, height 5\' 7"  (1.702 m), weight 85.7 kg (189 lb), SpO2 95 %. He's awake and alert. Chest is clear. Heart is regular now.  Disposition: Home      Signed: Bassy Fetterly L   03/06/2017, 8:56 AM

## 2017-03-06 NOTE — Progress Notes (Signed)
*  PRELIMINARY RESULTS* Echocardiogram 2D Echocardiogram has been performed.  Eric Beltran 03/06/2017, 9:49 AM

## 2017-03-06 NOTE — Care Management Note (Signed)
Case Management Note  Patient Details  Name: Eric Beltran MRN: [id number] Date of Birth: [birth year]  Subjective/Objective:                 Observation for r/o CP. Pt seen to deliver MOON and chart reviewed for CM needs. Pt from home, ind, has PCP, transportation to appointments and insurance with drug coverage. He manages his medications ind. He has no HH or DME needs pta.    Action/Plan: DC home today with self care. No CM needs noted at DC.   Expected Discharge Date:  03/06/17               Expected Discharge Plan:  Home/Self Care  In-House Referral:  NA  Discharge planning Services  CM Consult  Post Acute Care Choice:  NA Choice offered to:  NA  Status of Service:  Completed, signed off  Sherald Barge, RN 03/06/2017, 11:41 AM

## 2017-03-06 NOTE — Progress Notes (Signed)
Progress Note  Patient Name: Eric Beltran Date of Encounter: 03/06/2017  Primary Cardiologist: Dr. Domenic Polite  Subjective   Feeling well. Wants to go home. Denies chest pain and palpitations.  Inpatient Medications    Scheduled Meds: . aspirin EC  81 mg Oral Daily  . atorvastatin  40 mg Oral QHS  . diltiazem  240 mg Oral Daily  . gabapentin  300 mg Oral Daily  . metoprolol succinate  50 mg Oral Daily  . potassium chloride  20 mEq Oral BID  . sodium chloride flush  3 mL Intravenous Q12H  . tamsulosin  0.4 mg Oral Daily   Continuous Infusions: . sodium chloride     PRN Meds: sodium chloride, acetaminophen **OR** acetaminophen, sodium chloride flush   Vital Signs    Vitals:   03/05/17 1400 03/05/17 2034 03/05/17 2232 03/06/17 0547  BP: 135/64  135/70 140/72  Pulse: 64  60 61  Resp: 18  18 18   Temp: 97.8 F (36.6 C)  98.7 F (37.1 C) 98.4 F (36.9 C)  TempSrc: Oral  Oral Oral  SpO2: 91% 93% 96% 95%  Weight:      Height:        Intake/Output Summary (Last 24 hours) at 03/06/17 0946 Last data filed at 03/06/17 0900  Gross per 24 hour  Intake              723 ml  Output                0 ml  Net              723 ml   Filed Weights   03/04/17 2357  Weight: 189 lb (85.7 kg)    Telemetry    Sinus rhythm - Personally Reviewed   Physical Exam   GEN: No acute distress.   Neck: No JVD Cardiac: RRR, no murmurs, rubs, or gallops.  Respiratory: Clear to auscultation bilaterally. GI: Soft, nontender, non-distended  MS: No edema; No deformity. Neuro:  Nonfocal  Psych: Normal affect   Labs    Chemistry Recent Labs Lab 03/05/17 0002 03/05/17 0404 03/06/17 0607  NA 138 139 139  K 3.8 3.5 3.9  CL 106 106 106  CO2 22 23 25   GLUCOSE 112* 109* 112*  BUN 24* 23* 18  CREATININE 1.05 0.97 0.98  CALCIUM 9.2 8.6* 8.7*  PROT  --  6.2*  --   ALBUMIN  --  3.7  --   AST  --  15  --   ALT  --  15*  --   ALKPHOS  --  75  --   BILITOT  --  0.6  --     GFRNONAA >60 >60 >60  GFRAA >60 >60 >60  ANIONGAP 10 10 8      Hematology Recent Labs Lab 03/05/17 0002 03/05/17 0404  WBC 12.3* 11.1*  RBC 5.11 4.88  HGB 14.8 13.8  HCT 43.9 42.3  MCV 85.9 86.7  MCH 29.0 28.3  MCHC 33.7 32.6  RDW 15.1 15.3  PLT 231 237    Cardiac Enzymes Recent Labs Lab 03/05/17 0002 03/05/17 0404 03/05/17 0917 03/05/17 1517  TROPONINI <0.03 0.04* 0.07* 0.07*    Recent Labs Lab 03/05/17 0034  TROPIPOC 0.01     BNP Recent Labs Lab 03/05/17 0002  BNP 318.0*     DDimer  Recent Labs Lab 03/05/17 0002  DDIMER 0.30     Radiology    Dg Chest [facility]  Result Date: 03/05/2017 CLINICAL DATA:  Acute onset of mid chest pain and shortness of breath. Initial encounter. EXAM: PORTABLE CHEST 1 VIEW COMPARISON:  Chest radiograph performed 05/05/2012 FINDINGS: The lungs are well-aerated. Left basilar airspace opacity raises concern for pneumonia. There is no evidence of pleural effusion or pneumothorax. The cardiomediastinal silhouette is borderline normal in size. No acute osseous abnormalities are seen. IMPRESSION: Left basilar airspace opacity raises concern for pneumonia. Followup PA and lateral chest X-ray is recommended in 3-4 weeks following trial of antibiotic therapy to ensure resolution and exclude underlying malignancy. Electronically Signed   By: Garald Balding M.D.   On: 03/05/2017 00:41    Cardiac Studies   Echocardiogram performed today (pending review)  Patient Profile     76 y.o. male with CAD and CABG and h/o PSVT admitted with chest tightness, found to have paroxysmal AVNRT with rate-related RBBB.  Assessment & Plan    1. Chest tightness in context of CAD with CABG: Troponins minimally elevated. Currently asymptomatic.  Echocardiogram performed this morning and is pending review. Last cath showed progression of disease but no clear targets for PCI. Elevated troponins likely related to tachycardia. Continue current medical  therapy with ASA, Lipitor, and metoprolol.  2. Tachycardia/AVNRT: Currently in sinus rhythm. Mg and TSH normal. I had a lengthy discussion regarding his arrhythmia yesterday. I will arrange for close outpatient follow up with EP (Dr. Curt Bears). He may be a candidate for ablation. Continue diltiazem and metoprolol.  3. Chronic diastolic heart failure: Euvolemic. No changes to therapy.  4. Hypertension: Controlled. No changes.  5. Hyperlipidemia: Continue Lipitor.   Dispo: Can be discharged to home.     For questions or updates, please contact [place] Please consult [email or web address] for contact info under Cardiology/STEMI.      Signed, Kate Sable, MD  03/06/2017, 9:46 AM

## 2017-03-06 NOTE — Progress Notes (Signed)
Subjective: He's not having any chest pain. He is quite frustrated by the fact that he did not get the echocardiogram done that was ordered yesterday. He wants to go home.  Objective: Vital signs in last 24 hours: Temp:  [97.8 F (36.6 C)-98.7 F (37.1 C)] 98.4 F (36.9 C) (10/24 0547) Pulse Rate:  [60-64] 61 (10/24 0547) Resp:  [18] 18 (10/24 0547) BP: (135-140)/(64-72) 140/72 (10/24 0547) SpO2:  [91 %-96 %] 95 % (10/24 0547) Weight change:  Last BM Date: 03/04/17  Intake/Output from previous day: 10/23 0701 - 10/24 0700 In: 723 [P.O.:720; I.V.:3] Out: -   PHYSICAL EXAM General appearance: alert, cooperative and no distress Resp: clear to auscultation bilaterally Cardio: regular rate and rhythm, S1, S2 normal, no murmur, click, rub or gallop GI: soft, non-tender; bowel sounds normal; no masses,  no organomegaly Extremities: extremities normal, atraumatic, no cyanosis or edema Skin warm and dry  Lab Results:  Results for orders placed or performed during the hospital encounter of 03/04/17 (from the past 48 hour(s))  Basic metabolic panel     Status: Abnormal   Collection Time: 03/05/17 12:02 AM  Result Value Ref Range   Sodium 138 135 - 145 mmol/Beltran   Potassium 3.8 3.5 - 5.1 mmol/Beltran   Chloride 106 101 - 111 mmol/Beltran   CO2 22 22 - 32 mmol/Beltran   Glucose, Bld 112 (H) 65 - 99 mg/dL   BUN 24 (H) 6 - 20 mg/dL   Creatinine, Ser 1.05 0.61 - 1.24 mg/dL   Calcium 9.2 8.9 - 10.3 mg/dL   GFR calc non Af Amer >60 >60 mL/min   GFR calc Af Amer >60 >60 mL/min    Comment: (NOTE) The eGFR has been calculated using the CKD EPI equation. This calculation has not been validated in all clinical situations. eGFR's persistently <60 mL/min signify possible Chronic Kidney Disease.    Anion gap 10 5 - 15  CBC     Status: Abnormal   Collection Time: 03/05/17 12:02 AM  Result Value Ref Range   WBC 12.3 (H) 4.0 - 10.5 K/uL   RBC 5.11 4.22 - 5.81 MIL/uL   Hemoglobin 14.8 13.0 - 17.0 g/dL   HCT  43.9 39.0 - 52.0 %   MCV 85.9 78.0 - 100.0 fL   MCH 29.0 26.0 - 34.0 pg   MCHC 33.7 30.0 - 36.0 g/dL   RDW 15.1 11.5 - 15.5 %   Platelets 231 150 - 400 K/uL  Brain natriuretic peptide     Status: Abnormal   Collection Time: 03/05/17 12:02 AM  Result Value Ref Range   B Natriuretic Peptide 318.0 (H) 0.0 - 100.0 pg/mL  D-dimer, quantitative (not at [facility])     Status: None   Collection Time: 03/05/17 12:02 AM  Result Value Ref Range   D-Dimer, Quant 0.30 0.00 - 0.50 ug/mL-FEU    Comment: (NOTE) At the manufacturer cut-off of 0.50 ug/mL FEU, this assay has been documented to exclude PE with a sensitivity and negative predictive value of 97 to 99%.  At this time, this assay has not been approved by the FDA to exclude DVT/VTE. Results should be correlated with clinical presentation.   Troponin I     Status: None   Collection Time: 03/05/17 12:02 AM  Result Value Ref Range   Troponin I <0.03 <0.03 ng/mL  I-stat troponin, ED     Status: None   Collection Time: 03/05/17 12:34 AM  Result Value Ref Range   Troponin i,  poc 0.01 0.00 - 0.08 ng/mL   Comment 3            Comment: Due to the release kinetics of cTnI, a negative result within the first hours of the onset of symptoms does not rule out myocardial infarction with certainty. If myocardial infarction is still suspected, repeat the test at appropriate intervals.   APTT     Status: Abnormal   Collection Time: 03/05/17  2:49 AM  Result Value Ref Range   aPTT 37 (H) 24 - 36 seconds    Comment:        IF BASELINE aPTT IS ELEVATED, SUGGEST PATIENT RISK ASSESSMENT BE USED TO DETERMINE APPROPRIATE ANTICOAGULANT THERAPY.   Protime-INR     Status: None   Collection Time: 03/05/17  2:49 AM  Result Value Ref Range   Prothrombin Time 14.4 11.4 - 15.2 seconds   INR 1.13   CBC     Status: Abnormal   Collection Time: 03/05/17  4:04 AM  Result Value Ref Range   WBC 11.1 (H) 4.0 - 10.5 K/uL   RBC 4.88 4.22 - 5.81 MIL/uL   Hemoglobin  13.8 13.0 - 17.0 g/dL   HCT 42.3 39.0 - 52.0 %   MCV 86.7 78.0 - 100.0 fL   MCH 28.3 26.0 - 34.0 pg   MCHC 32.6 30.0 - 36.0 g/dL   RDW 15.3 11.5 - 15.5 %   Platelets 237 150 - 400 K/uL  Comprehensive metabolic panel     Status: Abnormal   Collection Time: 03/05/17  4:04 AM  Result Value Ref Range   Sodium 139 135 - 145 mmol/Beltran   Potassium 3.5 3.5 - 5.1 mmol/Beltran   Chloride 106 101 - 111 mmol/Beltran   CO2 23 22 - 32 mmol/Beltran   Glucose, Bld 109 (H) 65 - 99 mg/dL   BUN 23 (H) 6 - 20 mg/dL   Creatinine, Ser 0.97 0.61 - 1.24 mg/dL   Calcium 8.6 (Beltran) 8.9 - 10.3 mg/dL   Total Protein 6.2 (Beltran) 6.5 - 8.1 g/dL   Albumin 3.7 3.5 - 5.0 g/dL   AST 15 15 - 41 U/Beltran   ALT 15 (Beltran) 17 - 63 U/Beltran   Alkaline Phosphatase 75 38 - 126 U/Beltran   Total Bilirubin 0.6 0.3 - 1.2 mg/dL   GFR calc non Af Amer >60 >60 mL/min   GFR calc Af Amer >60 >60 mL/min    Comment: (NOTE) The eGFR has been calculated using the CKD EPI equation. This calculation has not been validated in all clinical situations. eGFR's persistently <60 mL/min signify possible Chronic Kidney Disease.    Anion gap 10 5 - 15  Troponin I (q 6hr x 3)     Status: Abnormal   Collection Time: 03/05/17  4:04 AM  Result Value Ref Range   Troponin I 0.04 (HH) <0.03 ng/mL    Comment: CRITICAL RESULT CALLED TO, READ BACK BY AND VERIFIED WITH: LEFLORE,C AT 6:00AM ON 03/05/17 BY FESTERMAN,C   Troponin I (q 6hr x 3)     Status: Abnormal   Collection Time: 03/05/17  9:17 AM  Result Value Ref Range   Troponin I 0.07 (HH) <0.03 ng/mL    Comment: CRITICAL RESULT CALLED TO, READ BACK BY AND VERIFIED WITH: HOBBS,A AT 10:00AM ON 03/05/17 BY FESTERMAN,C   Heparin level (unfractionated)     Status: None   Collection Time: 03/05/17  9:17 AM  Result Value Ref Range   Heparin Unfractionated 0.58 0.30 - 0.70  IU/mL    Comment:        IF HEPARIN RESULTS ARE BELOW EXPECTED VALUES, AND PATIENT DOSAGE HAS BEEN CONFIRMED, SUGGEST FOLLOW UP TESTING OF ANTITHROMBIN III  LEVELS.   Magnesium     Status: None   Collection Time: 03/05/17  9:17 AM  Result Value Ref Range   Magnesium 1.9 1.7 - 2.4 mg/dL  TSH     Status: None   Collection Time: 03/05/17  9:17 AM  Result Value Ref Range   TSH 1.170 0.350 - 4.500 uIU/mL    Comment: Performed by a 3rd Generation assay with a functional sensitivity of <=0.01 uIU/mL.  Troponin I (q 6hr x 3)     Status: Abnormal   Collection Time: 03/05/17  3:17 PM  Result Value Ref Range   Troponin I 0.07 (HH) <0.03 ng/mL    Comment: CRITICAL VALUE NOTED.  VALUE IS CONSISTENT WITH PREVIOUSLY REPORTED AND CALLED VALUE.  Basic metabolic panel     Status: Abnormal   Collection Time: 03/06/17  6:07 AM  Result Value Ref Range   Sodium 139 135 - 145 mmol/Beltran   Potassium 3.9 3.5 - 5.1 mmol/Beltran   Chloride 106 101 - 111 mmol/Beltran   CO2 25 22 - 32 mmol/Beltran   Glucose, Bld 112 (H) 65 - 99 mg/dL   BUN 18 6 - 20 mg/dL   Creatinine, Ser 0.98 0.61 - 1.24 mg/dL   Calcium 8.7 (Beltran) 8.9 - 10.3 mg/dL   GFR calc non Af Amer >60 >60 mL/min   GFR calc Af Amer >60 >60 mL/min    Comment: (NOTE) The eGFR has been calculated using the CKD EPI equation. This calculation has not been validated in all clinical situations. eGFR's persistently <60 mL/min signify possible Chronic Kidney Disease.    Anion gap 8 5 - 15  Magnesium     Status: None   Collection Time: 03/06/17  6:07 AM  Result Value Ref Range   Magnesium 2.1 1.7 - 2.4 mg/dL  Lipid panel     Status: Abnormal   Collection Time: 03/06/17  6:10 AM  Result Value Ref Range   Cholesterol 130 0 - 200 mg/dL   Triglycerides 262 (H) <150 mg/dL   HDL 28 (Beltran) >40 mg/dL   Total CHOL/HDL Ratio 4.6 RATIO   VLDL 52 (H) 0 - 40 mg/dL   LDL Cholesterol 50 0 - 99 mg/dL    Comment:        Total Cholesterol/HDL:CHD Risk Coronary Heart Disease Risk Table                     Men   Women  1/2 Average Risk   3.4   3.3  Average Risk       5.0   4.4  2 X Average Risk   9.6   7.1  3 X Average Risk  23.4   11.0         Use the calculated Patient Ratio above and the CHD Risk Table to determine the patient's CHD Risk.        ATP III CLASSIFICATION (LDL):  <100     mg/dL   Optimal  100-129  mg/dL   Near or Above                    Optimal  130-159  mg/dL   Borderline  160-189  mg/dL   High  >190     mg/dL   Very High  ABGS No results for input(s): PHART, PO2ART, TCO2, HCO3 in the last 72 hours.  Invalid input(s): PCO2 CULTURES No results found for this or any previous visit (from the past 240 hour(s)). Studies/Results: Dg Chest Port 1 View  Result Date: 03/05/2017 CLINICAL DATA:  Acute onset of mid chest pain and shortness of breath. Initial encounter. EXAM: PORTABLE CHEST 1 VIEW COMPARISON:  Chest radiograph performed 05/05/2012 FINDINGS: The lungs are well-aerated. Left basilar airspace opacity raises concern for pneumonia. There is no evidence of pleural effusion or pneumothorax. The cardiomediastinal silhouette is borderline normal in size. No acute osseous abnormalities are seen. IMPRESSION: Left basilar airspace opacity raises concern for pneumonia. Followup PA and lateral chest X-ray is recommended in 3-4 weeks following trial of antibiotic therapy to ensure resolution and exclude underlying malignancy. Electronically Signed   By: Garald Balding M.D.   On: 03/05/2017 00:41    Medications:  Prior to Admission:  Prescriptions Prior to Admission  Medication Sig Dispense Refill Last Dose  . aspirin EC 81 MG tablet Take 81 mg by mouth daily.   Past Week at Unknown time  . atorvastatin (LIPITOR) 40 MG tablet TAKE 1 TABLET BY MOUTH AT BEDTIME 90 tablet 6 Past Week at Unknown time  . diltiazem (CARDIZEM CD) 240 MG 24 hr capsule TAKE 1 CAPSULE BY MOUTH EVERY DAY 30 capsule 6 Past Week at Unknown time  . furosemide (LASIX) 20 MG tablet TAKE 1 TABLET BY MOUTH AS NEEDED FOR LEG SWELLING 90 tablet 3 Past Week at Unknown time  . metoprolol succinate (TOPROL-XL) 50 MG 24 hr tablet Take 1 tablet (50 mg  total) by mouth daily. 30 tablet 6 Past Week at Unknown time  . Multiple Vitamin (MULTIVITAMIN) tablet Take 1 tablet by mouth at bedtime.    Past Week at Unknown time  . naproxen (NAPROSYN) 500 MG tablet Take 500 mg by mouth 2 (two) times daily with a meal.   Past Week at Unknown time  . nitroGLYCERIN (NITROSTAT) 0.4 MG SL tablet Place 1 tablet (0.4 mg total) under the tongue every 5 (five) minutes as needed for chest pain. 25 tablet 3 03/04/2017 at Unknown time  . tadalafil (CIALIS) 5 MG tablet Take 5 mg by mouth as needed for erectile dysfunction.   Past Week at Unknown time  . Tamsulosin HCl (FLOMAX) 0.4 MG CAPS Take 0.4 mg by mouth daily.    Past Week at Unknown time  . valACYclovir (VALTREX) 500 MG tablet Take 500 mg by mouth as needed.   4 Past Month at Unknown time   Scheduled: . aspirin EC  81 mg Oral Daily  . atorvastatin  40 mg Oral QHS  . diltiazem  240 mg Oral Daily  . gabapentin  300 mg Oral Daily  . Influenza vac split quadrivalent PF  0.5 mL Intramuscular Tomorrow-1000  . metoprolol succinate  50 mg Oral Daily  . potassium chloride  20 mEq Oral BID  . sodium chloride flush  3 mL Intravenous Q12H  . tamsulosin  0.4 mg Oral Daily   Continuous: . sodium chloride     WPY:KDXIPJ chloride, acetaminophen **OR** acetaminophen, sodium chloride flush  Assesment: He was admitted with chest pain. He is known to have coronary disease at baseline. Echocardiogram ordered yesterday has not been done yet. He has cardiac arrhythmias and may need to see electrophysiologist. Active Problems:   Chest pain    Plan: Discuss with cardiology. potential discharge    LOS: 0 days   Eric Beltran  03/06/2017, 8:52 AM

## 2017-03-20 ENCOUNTER — Ambulatory Visit: Payer: Medicare Other | Admitting: Cardiology

## 2017-04-08 ENCOUNTER — Ambulatory Visit: Payer: Medicare Other | Admitting: Cardiology

## 2017-04-08 ENCOUNTER — Encounter: Payer: Self-pay | Admitting: Cardiology

## 2017-04-08 ENCOUNTER — Encounter (INDEPENDENT_AMBULATORY_CARE_PROVIDER_SITE_OTHER): Payer: Self-pay

## 2017-04-08 VITALS — BP 110/62 | HR 69 | Ht 67.0 in | Wt 194.0 lb

## 2017-04-08 DIAGNOSIS — I471 Supraventricular tachycardia: Secondary | ICD-10-CM

## 2017-04-08 DIAGNOSIS — I25708 Atherosclerosis of coronary artery bypass graft(s), unspecified, with other forms of angina pectoris: Secondary | ICD-10-CM | POA: Diagnosis not present

## 2017-04-08 DIAGNOSIS — E785 Hyperlipidemia, unspecified: Secondary | ICD-10-CM

## 2017-04-08 MED ORDER — METOPROLOL SUCCINATE ER 100 MG PO TB24: 100.0000 mg | ORAL_TABLET | Freq: Every day | ORAL | 1 refills | Status: DC

## 2017-04-08 NOTE — Patient Instructions (Addendum)
Medication Instructions:  Your physician has recommended you make the following change in your medication:  1. INCREASE TOPROL 100 mg once daily  * If you need a refill on your cardiac medications before your next appointment, please call your pharmacy. *  Labwork: None ordered  Testing/Procedures: None ordered  Follow-Up: Your physician recommends that you schedule a follow-up appointment in: 3 months with Dr. Curt Bears.  Thank you for choosing [facility]!!   Trinidad Curet, RN [phone]

## 2017-04-08 NOTE — Progress Notes (Signed)
Electrophysiology Office Note   Date:  04/08/2017   ID:  SYON TEWS, DOB 29-May-1940, MRN [id number]  PCP:  Sinda Du, MD  Cardiologist:  Domenic Polite Primary Electrophysiologist:  Hilma Steinhilber Meredith Leeds, MD    Chief Complaint  Patient presents with  . Follow-up    SVT     History of Present Illness: Eric Beltran is a 76 y.o. male who is being seen today for the evaluation of palpitations at the request of Sinda Du, MD. Presenting today for electrophysiology evaluation.  She has a history of coronary disease status post stenting and CABG with a LIMA to the LAD, SVG to PLB, SVG to ramus, SVG to OM in conjunction with a sending aortic aneurysm repair in 2013 at [facility], perioperative atrial fibrillation, chronic dyspnea, hypertension, hyperlipidemia, SVT, and chronic diastolic heart failure.  He was seen in the hospital on 10/23 for evaluation of chest pain and palpitations.  He has sensation of vague chest pressure.  He took nitroglycerin without any relief.  His heart rate was in the 120s at that time.  He received IV metoprolol and converted to sinus rhythm.  His episode of wide complex tachycardia tachycardia, possibly consistent with SVT with right bundle aberrancy.    Today, he denies symptoms of palpitations, chest pain, shortness of breath, orthopnea, PND, lower extremity edema, claudication, dizziness, presyncope, syncope, bleeding, or neurologic sequela. The patient is tolerating medications without difficulties.  At that time, he is felt well.  He has had no further episodes of palpitations.  Have a history of SVT.  His rates and SVT are usually closer to 160 beats a minute.   Past Medical History:  Diagnosis Date  . Ascending aortic aneurysm [facility])    Status post repair 12/2011 at [facility]  . Atrial fibrillation (HCC)    Perioperative  . BPH (benign prostatic hyperplasia)   . Chronic diastolic CHF (congestive heart failure) ([place])   . Chronic shortness of breath   .  Coronary atherosclerosis of native coronary artery    a. Multivessel status post prior stenting and ultimately CABG 12/2011 at [facility] with with LIMA to LAD, SVG to PLB, SVG to ramus, SVG to OM . b. Last cath 06/2014 (following ischemic nuc) -> medical therapy, no feasible way to revascularize LCx territory.  . Essential hypertension, benign   . Mixed hyperlipidemia   . Myocardial infarction ([place])   . Supraventricular tachycardia [facility])    Past Surgical History:  Procedure Laterality Date  . ASCENDING AORTIC ANEURYSM REPAIR  12/2011 [facility]   26 mm.Dacron graft  . CORONARY ARTERY BYPASS GRAFT  12/2011 [facility]   LIMA to LAD, SVG to PLB, SVG to ramus, SVG to OM  . HERNIA REPAIR    . LEFT HEART CATHETERIZATION WITH CORONARY ANGIOGRAM N/A 06/18/2014   Procedure: LEFT HEART CATHETERIZATION WITH CORONARY ANGIOGRAM;  Surgeon: Blane Ohara, MD;  Location: [facility] CATH LAB;  Service: Cardiovascular;  Laterality: N/A;  . TOTAL HIP ARTHROPLASTY Right 02/07/2016   Procedure: RIGHT TOTAL HIP ARTHROPLASTY ANTERIOR APPROACH;  Surgeon: Mcarthur Rossetti, MD;  Location: [place];  Service: Orthopedics;  Laterality: Right;  . TRANSURETHRAL RESECTION OF PROSTATE       Current Outpatient Medications  Medication Sig Dispense Refill  . aspirin EC 81 MG tablet Take 81 mg by mouth daily.    [name] atorvastatin (LIPITOR) 40 MG tablet TAKE 1 TABLET BY MOUTH AT BEDTIME 90 tablet 6  . diltiazem (CARDIZEM CD) 240 MG 24 hr capsule TAKE  1 CAPSULE BY MOUTH EVERY DAY 30 capsule 6  . furosemide (LASIX) 20 MG tablet TAKE 1 TABLET BY MOUTH AS NEEDED FOR LEG SWELLING 90 tablet 3  . Multiple Vitamin (MULTIVITAMIN) tablet Take 1 tablet by mouth at bedtime.     . naproxen (NAPROSYN) 500 MG tablet Take 500 mg by mouth 2 (two) times daily with a meal.    . nitroGLYCERIN (NITROSTAT) 0.4 MG SL tablet Place 1 tablet (0.4 mg total) under the tongue every 5 (five) minutes as needed for chest pain. 25 tablet 3  . tadalafil (CIALIS) 5 MG tablet  Take 5 mg by mouth as needed for erectile dysfunction.    . Tamsulosin HCl (FLOMAX) 0.4 MG CAPS Take 0.4 mg by mouth daily.     . valACYclovir (VALTREX) 500 MG tablet Take 500 mg by mouth as needed.   4  . metoprolol succinate (TOPROL-XL) 100 MG 24 hr tablet Take 1 tablet (100 mg total) by mouth daily. Take with or immediately following a meal. 90 tablet 1   No current facility-administered medications for this visit.     Allergies:   Other   Social History:  The patient  reports that  has never smoked. he has never used smokeless tobacco. He reports that he drinks alcohol. He reports that he does not use drugs.   Family History:  The patient's family history includes Hypertension in his father and mother; Lung cancer in his sister; Parkinson's disease in his father.    ROS:  Please see the history of present illness.   Otherwise, review of systems is positive for none.   All other systems are reviewed and negative.    PHYSICAL EXAM: VS:  BP 110/62   Pulse 69   Ht 5\' 7"  (1.702 m)   Wt 194 lb (88 kg)   SpO2 97%   BMI 30.38 kg/m  , BMI Body mass index is 30.38 kg/m. GEN: Well nourished, well developed, in no acute distress  HEENT: normal  Neck: no JVD, carotid bruits, or masses Cardiac: RRR; no murmurs, rubs, or gallops,no edema  Respiratory:  clear to auscultation bilaterally, normal work of breathing GI: soft, nontender, nondistended, + BS MS: no deformity or atrophy  Skin: warm and dry Neuro:  Strength and sensation are intact Psych: euthymic mood, full affect  EKG:  EKG is not ordered today. Personal review of the ekg ordered 03/05/17 shows wide complex tachycardia with retrograde P waves, RBBB  Recent Labs: 03/05/2017: ALT 15; B Natriuretic Peptide 318.0; Hemoglobin 13.8; Platelets 237; TSH 1.170 03/06/2017: BUN 18; Creatinine, Ser 0.98; Magnesium 2.1; Potassium 3.9; Sodium 139    Lipid Panel     Component Value Date/Time   CHOL 130 03/06/2017 0610   TRIG 262 (H)  03/06/2017 0610   HDL 28 (L) 03/06/2017 0610   CHOLHDL 4.6 03/06/2017 0610   VLDL 52 (H) 03/06/2017 0610   LDLCALC 50 03/06/2017 0610     Wt Readings from Last 3 Encounters:  04/08/17 194 lb (88 kg)  03/04/17 189 lb (85.7 kg)  12/28/16 195 lb (88.5 kg)      Other studies Reviewed: Additional studies/ records that were reviewed today include: TTE 03/06/17  Review of the above records today demonstrates:  - Left ventricle: The cavity size was normal. There was moderate   concentric and severe focal basal septal hypertrophy. Systolic   function was normal. The estimated ejection fraction was in the   range of 60% to 65%. Wall motion was  normal; there were no   regional wall motion abnormalities. Features are consistent with   a pseudonormal left ventricular filling pattern, with concomitant   abnormal relaxation and increased filling pressure (grade 2   diastolic dysfunction). Doppler parameters are consistent with   high ventricular filling pressure. - Aortic valve: There was mild regurgitation. - Aortic root: The aortic root was mildly dilated (4.48 cm). - Left atrium: The atrium was severely dilated.   ASSESSMENT AND PLAN:  1.  Wide-complex tachycardia: Does have retrograde P waves.  His ejection fraction was normal on his most recent check.  It is possible that this is just due to SVT, though VT is in the differential diagnosis.  He was having chest pain.  He has had SVT in the past with right bundle aberrancy.  We Viola Kinnick plan for continued medical management.  We Mickael Mcnutt increase his Toprol-XL to 100 mg.  I Ragan Duhon see him back in 3 months for further discussion of possible ablation.  2.  Coronary artery disease status post CABG with chronic stable angina: Currently no chest pain.  Has had chest pain with tachycardia in the past.  No changes at this time.  3.  Hyperlipidemia: Continue Lipitor.    Current medicines are reviewed at length with the patient today.   The patient does  not have concerns regarding his medicines.  The following changes were made today:  none  Labs/ tests ordered today include:  No orders of the defined types were placed in this encounter.    Disposition:   FU with Kayla Deshaies 3 months  Signed, Delshon Blanchfield Meredith Leeds, MD  04/08/2017 4:05 PM     [address] [place] [address] [phone] (office) [phone] (fax)

## 2017-05-15 ENCOUNTER — Other Ambulatory Visit: Payer: Self-pay | Admitting: Cardiology

## 2017-05-15 MED ORDER — VALACYCLOVIR 500 MG TABLET
ORAL_TABLET | ORAL | 1 refills | 0.00000 days | Status: CP
Start: 2017-05-15 — End: 2017-07-15

## 2017-06-06 ENCOUNTER — Ambulatory Visit: Admit: 2017-06-06 | Discharge: 2017-06-06 | Payer: MEDICARE

## 2017-06-06 DIAGNOSIS — L638 Other alopecia areata: Secondary | ICD-10-CM | POA: Diagnosis not present

## 2017-06-06 DIAGNOSIS — L814 Other melanin hyperpigmentation: Secondary | ICD-10-CM | POA: Diagnosis not present

## 2017-06-06 DIAGNOSIS — L821 Other seborrheic keratosis: Secondary | ICD-10-CM | POA: Diagnosis not present

## 2017-06-06 DIAGNOSIS — L578 Other skin changes due to chronic exposure to nonionizing radiation: Secondary | ICD-10-CM | POA: Diagnosis not present

## 2017-06-06 DIAGNOSIS — Z139 Encounter for screening, unspecified: Secondary | ICD-10-CM

## 2017-06-06 DIAGNOSIS — L639 Alopecia areata, unspecified: Secondary | ICD-10-CM

## 2017-06-06 DIAGNOSIS — D229 Melanocytic nevi, unspecified: Secondary | ICD-10-CM

## 2017-06-11 MED ORDER — TAMSULOSIN 0.4 MG CAPSULE
ORAL_CAPSULE | Freq: Every day | ORAL | 1 refills | 0.00000 days | Status: CP
Start: 2017-06-11 — End: 2017-12-08

## 2017-06-19 ENCOUNTER — Ambulatory Visit: Admit: 2017-06-19 | Discharge: 2017-06-19 | Payer: MEDICARE

## 2017-06-19 ENCOUNTER — Ambulatory Visit
Admit: 2017-06-19 | Discharge: 2017-06-19 | Payer: MEDICARE | Attending: Nurse Practitioner | Primary: Nurse Practitioner

## 2017-06-19 DIAGNOSIS — N2889 Other specified disorders of kidney and ureter: Secondary | ICD-10-CM | POA: Diagnosis not present

## 2017-06-19 DIAGNOSIS — N401 Enlarged prostate with lower urinary tract symptoms: Secondary | ICD-10-CM | POA: Diagnosis not present

## 2017-06-19 MED ORDER — VARDENAFIL 10 MG TABLET
ORAL_TABLET | Freq: Once | ORAL | 0 refills | 0.00000 days | Status: CP | PRN
Start: 2017-06-19 — End: 2017-06-26

## 2017-06-26 MED ORDER — VARDENAFIL 20 MG TABLET
ORAL_TABLET | Freq: Every day | ORAL | 3 refills | 0 days | Status: CP | PRN
Start: 2017-06-26 — End: ?

## 2017-07-08 ENCOUNTER — Other Ambulatory Visit: Payer: Self-pay | Admitting: Cardiology

## 2017-07-08 ENCOUNTER — Encounter (INDEPENDENT_AMBULATORY_CARE_PROVIDER_SITE_OTHER): Payer: Self-pay

## 2017-07-08 ENCOUNTER — Encounter: Payer: Self-pay | Admitting: Cardiology

## 2017-07-08 ENCOUNTER — Ambulatory Visit: Payer: Medicare Other | Admitting: Cardiology

## 2017-07-08 VITALS — BP 114/60 | HR 62 | Ht 67.0 in | Wt 195.0 lb

## 2017-07-08 DIAGNOSIS — I25700 Atherosclerosis of coronary artery bypass graft(s), unspecified, with unstable angina pectoris: Secondary | ICD-10-CM

## 2017-07-08 DIAGNOSIS — R079 Chest pain, unspecified: Secondary | ICD-10-CM

## 2017-07-08 DIAGNOSIS — Z01812 Encounter for preprocedural laboratory examination: Secondary | ICD-10-CM

## 2017-07-08 DIAGNOSIS — I25709 Atherosclerosis of coronary artery bypass graft(s), unspecified, with unspecified angina pectoris: Secondary | ICD-10-CM

## 2017-07-08 NOTE — Patient Instructions (Signed)
Medication Instructions:  Your physician recommends that you continue on your current medications as directed. Please refer to the Current Medication list given to you today.  * If you need a refill on your cardiac medications before your next appointment, please call your pharmacy. *  Labwork: Pre procedure lab work today: BMET & CBC w/ diff  * Will notify you of abnormal results, otherwise continue current treatment plan.*  Testing/Procedures: Your physician has requested that you have a cardiac catheterization. Cardiac catheterization is used to diagnose and/or treat various heart conditions. Doctors may recommend this procedure for a number of different reasons. The most common reason is to evaluate chest pain. Chest pain can be a symptom of coronary artery disease (CAD), and cardiac catheterization can show whether plaque is narrowing or blocking your heart's arteries. This procedure is also used to evaluate the valves, as well as measure the blood flow and oxygen levels in different parts of your heart. For further information please visit [email or web address]. Please follow the instructions below, located under the special instructions section.  Follow-Up: Your physician recommends that you schedule a follow-up appointment in: 6 weeks with Dr. Domenic Polite  Your physician wants you to follow-up in: 6 months with Dr. Curt Bears. You will receive a reminder letter in the mail two months in advance. If you don't receive a letter, please call our office to schedule the follow-up appointment.   Thank you for choosing [facility]!!   Trinidad Curet, RN [phone]  Any Other Special Instructions Will Be Listed Below (If Applicable).   CATHETERIZATION INSTRUCTIONS  You are scheduled for a Cardiac Catheterization on Thursday, February 28 with Dr. [name].  1. Please arrive at the [facility] (Main Entrance A) at [facility]: [address] [place] [address] at 11:30  AM (two hours before your procedure to ensure your preparation). Free valet parking service is available.   Special note: Every effort is made to have your procedure done on time. Please understand that emergencies sometimes delay scheduled procedures.  2. Diet: Do not eat or drink anything after midnight prior to your procedure except sips of water to take medications.  3. Labs: You will need to have blood drawn on Monday, February 25 at [facility]. [address]  Open: 7:30am - 5pm    Phone: [phone]. You do not need to be fasting.  4. Medication instructions in preparation for your procedure:     - do not take any Lasix or Flomax the morning of your procedure  On the morning of your procedure, take your Aspirin and any morning medicines NOT listed above.  You may use sips of water.  5. Plan for one night stay--bring personal belongings. 6. Bring a current list of your medications and current insurance cards. 7. You MUST have a responsible person to drive you home. 8. Someone MUST be with you the first 24 hours after you arrive home or your discharge will be delayed. 9. Please wear clothes that are easy to get on and off and wear slip-on shoes.  Thank you for allowing [place] to care for you!   -- [facility]     Coronary Angiogram A coronary angiogram is an X-ray procedure that is used to examine the arteries in the heart. In this procedure, a dye (contrast dye) is injected through a long, thin tube (catheter). The catheter is inserted through the groin, wrist, or arm. The  dye is injected into each artery, then X-rays are taken to show if there is a blockage in the arteries of the heart. This procedure can also show if you have valve disease or a disease of the aorta, and it can be used to check the overall function of your heart muscle. You may have a coronary angiogram if:  You are having chest pain, or  other symptoms of angina, and you are at risk for heart disease.  You have an abnormal electrocardiogram (ECG) or stress test.  You have chest pain and heart failure.  You are having irregular heart rhythms.  You and your health care provider determine that the benefits of the test information outweigh the risks of the procedure.  Let your health care provider know about:  Any allergies you have, including allergies to contrast dye.  All medicines you are taking, including vitamins, herbs, eye drops, creams, and over-the-counter medicines.  Any problems you or family members have had with anesthetic medicines.  Any blood disorders you have.  Any surgeries you have had.  History of kidney problems or kidney failure.  Any medical conditions you have.  Whether you are pregnant or may be pregnant. What are the risks? Generally, this is a safe procedure. However, problems may occur, including:  Infection.  Allergic reaction to medicines or dyes that are used.  Bleeding from the access site or other locations.  Kidney injury, especially in people with impaired kidney function.  Stroke (rare).  Heart attack (rare).  Damage to other structures or organs.  What happens before the procedure? Staying hydrated Follow instructions from your health care provider about hydration, which may include:  Up to 2 hours before the procedure - you may continue to drink clear liquids, such as water, clear fruit juice, black coffee, and plain tea.  Eating and drinking restrictions Follow instructions from your health care provider about eating and drinking, which may include:  8 hours before the procedure - stop eating heavy meals or foods such as meat, fried foods, or fatty foods.  6 hours before the procedure - stop eating light meals or foods, such as toast or cereal.  2 hours before the procedure - stop drinking clear liquids.  General instructions  Ask your health care  provider about: ? Changing or stopping your regular medicines. This is especially important if you are taking diabetes medicines or blood thinners. ? Taking medicines such as ibuprofen. These medicines can thin your blood. Do not take these medicines before your procedure if your health care provider instructs you not to, though aspirin may be recommended prior to coronary angiograms.  Plan to have someone take you home from the hospital or clinic.  You may need to have blood tests or X-rays done. What happens during the procedure?  An IV tube will be inserted into one of your veins.  You will be given one or more of the following: ? A medicine to help you relax (sedative). ? A medicine to numb the area where the catheter will be inserted into an artery (local anesthetic).  To reduce your risk of infection: ? Your health care team will wash or sanitize their hands. ? Your skin will be washed with soap. ? Hair may be removed from the area where the catheter will be inserted.  You will be connected to a continuous ECG monitor.  The catheter will be inserted into an artery. The location may be in your groin, in your wrist, or in  the fold of your arm (near your elbow).  A type of X-ray (fluoroscopy) will be used to help guide the catheter to the opening of the blood vessel that is being examined.  A dye will be injected into the catheter, and X-rays will be taken. The dye will help to show where any narrowing or blockages are located in the heart arteries.  Tell your health care provider if you have any chest pain or trouble breathing during the procedure.  If blockages are found, your health care provider may perform another procedure, such as inserting a coronary stent. The procedure may vary among health care providers and hospitals. What happens after the procedure?  After the procedure, you will need to keep the area still for a few hours, or for as long as told by your health care  provider. If the procedure is done through the groin, you will be instructed to not bend and not cross your legs.  The insertion site will be checked frequently.  The pulse in your foot or wrist will be checked frequently.  You may have additional blood tests, X-rays, and a test that records the electrical activity of your heart (ECG).  Do not drive for 24 hours if you were given a sedative. Summary  A coronary angiogram is an X-ray procedure that is used to look into the arteries in the heart.  During the procedure, a dye (contrast dye) is injected through a long, thin tube (catheter). The catheter is inserted through the groin, wrist, or arm.  Tell your health care provider about any allergies you have, including allergies to contrast dye.  After the procedure, you will need to keep the area still for a few hours, or for as long as told by your health care provider. This information is not intended to replace advice given to you by your health care provider. Make sure you discuss any questions you have with your health care provider. Document Released: 11/04/2002 Document Revised: 02/10/2016 Document Reviewed: 02/10/2016 Elsevier Interactive Patient Education  [employer].

## 2017-07-08 NOTE — Progress Notes (Signed)
Electrophysiology Office Note   Date:  07/08/2017   ID:  Eric Beltran, DOB [birth year], MRN [id number]  PCP:  Eric Du, MD  Cardiologist:  Eric Beltran Primary Electrophysiologist:  Eric Farrugia Meredith Leeds, MD    Chief Complaint  Patient presents with  . Follow-up    PSVT     History of Present Illness: Eric Beltran is a 76 y.o. male who is being seen today for the evaluation of palpitations at the request of Eric Du, MD. Presenting today for electrophysiology evaluation.  She has a history of coronary disease status post stenting and CABG with a LIMA to the LAD, SVG to PLB, SVG to ramus, SVG to OM in conjunction with a sending aortic aneurysm repair in 2013 at [facility], perioperative atrial fibrillation, chronic dyspnea, hypertension, hyperlipidemia, SVT, and chronic diastolic heart failure.  He was seen in the hospital on 10/23 for evaluation of chest pain and palpitations.  He has sensation of vague chest pressure.  He took nitroglycerin without any relief.  His heart rate was in the 120s at that time.  He received IV metoprolol and converted to sinus rhythm.  His episode of wide complex tachycardia tachycardia, possibly consistent with SVT with right bundle aberrancy.  Today, denies symptoms of palpitations, shortness of breath, orthopnea, PND, lower extremity edema, claudication, dizziness, presyncope, syncope, bleeding, or neurologic sequela. The patient is tolerating medications without difficulties.  Had no palpitations since last being seen.  He has had accelerating angina.  He has angina when he exerts himself mainly with walking upstairs.  This is been occurring for the last few months.  It is occurred since his diagnosis of SVT.  He has a squeezing pain in the center of his chest, not associated with shortness of breath.  This discomfort is very similar to the discomfort that he had prior to his CABG.   Past Medical History:  Diagnosis Date  . Ascending aortic aneurysm  [facility])    Status post repair 12/2011 at [facility]  . Atrial fibrillation (HCC)    Perioperative  . BPH (benign prostatic hyperplasia)   . Chronic diastolic CHF (congestive heart failure) ([place])   . Chronic shortness of breath   . Coronary atherosclerosis of native coronary artery    a. Multivessel status post prior stenting and ultimately CABG 12/2011 at [facility] with with LIMA to LAD, SVG to PLB, SVG to ramus, SVG to OM . b. Last cath 06/2014 (following ischemic nuc) -> medical therapy, no feasible way to revascularize LCx territory.  . Essential hypertension, benign   . Mixed hyperlipidemia   . Myocardial infarction ([place])   . Supraventricular tachycardia [facility])    Past Surgical History:  Procedure Laterality Date  . ASCENDING AORTIC ANEURYSM REPAIR  12/2011 [facility]   26 mm.Dacron graft  . CORONARY ARTERY BYPASS GRAFT  12/2011 [facility]   LIMA to LAD, SVG to PLB, SVG to ramus, SVG to OM  . HERNIA REPAIR    . LEFT HEART CATHETERIZATION WITH CORONARY ANGIOGRAM N/A 06/18/2014   Procedure: LEFT HEART CATHETERIZATION WITH CORONARY ANGIOGRAM;  Surgeon: Blane Ohara, MD;  Location: [facility] CATH LAB;  Service: Cardiovascular;  Laterality: N/A;  . TOTAL HIP ARTHROPLASTY Right 02/07/2016   Procedure: RIGHT TOTAL HIP ARTHROPLASTY ANTERIOR APPROACH;  Surgeon: Mcarthur Rossetti, MD;  Location: [place];  Service: Orthopedics;  Laterality: Right;  . TRANSURETHRAL RESECTION OF PROSTATE       Current Outpatient Medications  Medication Sig Dispense Refill  . aspirin EC 81  MG tablet Take 81 mg by mouth daily.    [name] atorvastatin (LIPITOR) 40 MG tablet TAKE 1 TABLET BY MOUTH AT BEDTIME 90 tablet 6  . CARTIA XT 240 MG 24 hr capsule TAKE ONE CAPSULE BY MOUTH EVERY DAY 30 capsule 6  . furosemide (LASIX) 20 MG tablet TAKE 1 TABLET BY MOUTH AS NEEDED FOR LEG SWELLING 90 tablet 3  . Multiple Vitamin (MULTIVITAMIN) tablet Take 1 tablet by mouth at bedtime.     . naproxen (NAPROSYN) 500 MG tablet Take 500 mg by mouth 2 (two)  times daily with a meal.    . nitroGLYCERIN (NITROSTAT) 0.4 MG SL tablet Place 1 tablet (0.4 mg total) under the tongue every 5 (five) minutes as needed for chest pain. 25 tablet 3  . tadalafil (CIALIS) 5 MG tablet Take 5 mg by mouth as needed for erectile dysfunction.    . Tamsulosin HCl (FLOMAX) 0.4 MG CAPS Take 0.4 mg by mouth daily.     . valACYclovir (VALTREX) 500 MG tablet Take 500 mg by mouth as needed.   4  . metoprolol succinate (TOPROL-XL) 100 MG 24 hr tablet Take 1 tablet (100 mg total) by mouth daily. Take with or immediately following a meal. 90 tablet 1   No current facility-administered medications for this visit.     Allergies:   Patient has no active allergies.   Social History:  The patient  reports that  has never smoked. he has never used smokeless tobacco. He reports that he drinks alcohol. He reports that he does not use drugs.   Family History:  The patient's family history includes Hypertension in his father and mother; Lung cancer in his sister; Parkinson's disease in his father.   ROS:  Please see the history of present illness.   Otherwise, review of systems is positive for leg swelling.   All other systems are reviewed and negative.   PHYSICAL EXAM: VS:  BP 114/60   Pulse 62   Ht 5\' 7"  (1.702 m)   Wt 195 lb (88.5 kg)   BMI 30.54 kg/m  , BMI Body mass index is 30.54 kg/m. GEN: Well nourished, well developed, in no acute distress  HEENT: normal  Neck: no JVD, carotid bruits, or masses Cardiac: RRR; no murmurs, rubs, or gallops,no edema  Respiratory:  clear to auscultation bilaterally, normal work of breathing GI: soft, nontender, nondistended, + BS MS: no deformity or atrophy  Skin: warm and dry Neuro:  Strength and sensation are intact Psych: euthymic mood, full affect  EKG:  EKG is not ordered today. Personal review of the ekg ordered 03/05/17 shows SVT, rate 115   Recent Labs: 03/05/2017: ALT 15; B Natriuretic Peptide 318.0; Hemoglobin 13.8;  Platelets 237; TSH 1.170 03/06/2017: BUN 18; Creatinine, Ser 0.98; Magnesium 2.1; Potassium 3.9; Sodium 139    Lipid Panel     Component Value Date/Time   CHOL 130 03/06/2017 0610   TRIG 262 (H) 03/06/2017 0610   HDL 28 (L) 03/06/2017 0610   CHOLHDL 4.6 03/06/2017 0610   VLDL 52 (H) 03/06/2017 0610   LDLCALC 50 03/06/2017 0610     Wt Readings from Last 3 Encounters:  07/08/17 195 lb (88.5 kg)  04/08/17 194 lb (88 kg)  03/04/17 189 lb (85.7 kg)      Other studies Reviewed: Additional studies/ records that were reviewed today include: TTE 03/06/17  Review of the above records today demonstrates:  - Left ventricle: The cavity size was normal. There was moderate  concentric and severe focal basal septal hypertrophy. Systolic   function was normal. The estimated ejection fraction was in the   range of 60% to 65%. Wall motion was normal; there were no   regional wall motion abnormalities. Features are consistent with   a pseudonormal left ventricular filling pattern, with concomitant   abnormal relaxation and increased filling pressure (grade 2   diastolic dysfunction). Doppler parameters are consistent with   high ventricular filling pressure. - Aortic valve: There was mild regurgitation. - Aortic root: The aortic root was mildly dilated (4.48 cm). - Left atrium: The atrium was severely dilated.   ASSESSMENT AND PLAN:  1.  Wide-complex tachycardia: Does have retrograde P waves and thus this could be SVT though VT is in the differential diagnosis with his history of coronary disease.  He was put on Toprol-XL which has greatly improved his symptoms.  No changes at this time.  2.  Coronary artery disease status post CABG with accelerating angina: Chest pain has worsened over the last few months.  His pain is similar to his chest pain that he had prior to his CABG.  We Aditri Louischarles plan for left heart catheterization.  3.  Hyperlipidemia: Continue Lipitor.    Current medicines are  reviewed at length with the patient today.   The patient does not have concerns regarding his medicines.  The following changes were made today: None  Labs/ tests ordered today include:  Orders Placed This Encounter  Procedures  . Basic Metabolic Panel (BMET)  . CBC w/Diff     Disposition:   FU with Brazil Voytko 6 months  Signed, Eric Donner Meredith Leeds, MD  07/08/2017 4:41 PM     [place] [address] [place] [address] [phone] (office) [phone] (fax)

## 2017-07-08 NOTE — H&P (View-Only) (Signed)
Electrophysiology Office Note   Date:  07/08/2017   ID:  Eric Beltran, DOB 02-Mar-1941, MRN [id number]  PCP:  Sinda Du, MD  Cardiologist:  Domenic Polite Primary Electrophysiologist:  Kassity Woodson Meredith Leeds, MD    Chief Complaint  Patient presents with  . Follow-up    PSVT     History of Present Illness: Eric Beltran is a 77 y.o. male who is being seen today for the evaluation of palpitations at the request of Sinda Du, MD. Presenting today for electrophysiology evaluation.  She has a history of coronary disease status post stenting and CABG with a LIMA to the LAD, SVG to PLB, SVG to ramus, SVG to OM in conjunction with a sending aortic aneurysm repair in 2013 at [facility], perioperative atrial fibrillation, chronic dyspnea, hypertension, hyperlipidemia, SVT, and chronic diastolic heart failure.  He was seen in the hospital on 10/23 for evaluation of chest pain and palpitations.  He has sensation of vague chest pressure.  He took nitroglycerin without any relief.  His heart rate was in the 120s at that time.  He received IV metoprolol and converted to sinus rhythm.  His episode of wide complex tachycardia tachycardia, possibly consistent with SVT with right bundle aberrancy.  Today, denies symptoms of palpitations, shortness of breath, orthopnea, PND, lower extremity edema, claudication, dizziness, presyncope, syncope, bleeding, or neurologic sequela. The patient is tolerating medications without difficulties.  Had no palpitations since last being seen.  He has had accelerating angina.  He has angina when he exerts himself mainly with walking upstairs.  This is been occurring for the last few months.  It is occurred since his diagnosis of SVT.  He has a squeezing pain in the center of his chest, not associated with shortness of breath.  This discomfort is very similar to the discomfort that he had prior to his CABG.   Past Medical History:  Diagnosis Date  . Ascending aortic aneurysm  [facility])    Status post repair 12/2011 at [facility]  . Atrial fibrillation (HCC)    Perioperative  . BPH (benign prostatic hyperplasia)   . Chronic diastolic CHF (congestive heart failure) ([place])   . Chronic shortness of breath   . Coronary atherosclerosis of native coronary artery    a. Multivessel status post prior stenting and ultimately CABG 12/2011 at [name] with with LIMA to LAD, SVG to PLB, SVG to ramus, SVG to OM . b. Last cath 06/2014 (following ischemic nuc) -> medical therapy, no feasible way to revascularize LCx territory.  . Essential hypertension, benign   . Mixed hyperlipidemia   . Myocardial infarction ([place])   . Supraventricular tachycardia [facility])    Past Surgical History:  Procedure Laterality Date  . ASCENDING AORTIC ANEURYSM REPAIR  12/2011 [facility]   26 mm.Dacron graft  . CORONARY ARTERY BYPASS GRAFT  12/2011 [facility]   LIMA to LAD, SVG to PLB, SVG to ramus, SVG to OM  . HERNIA REPAIR    . LEFT HEART CATHETERIZATION WITH CORONARY ANGIOGRAM N/A 06/18/2014   Procedure: LEFT HEART CATHETERIZATION WITH CORONARY ANGIOGRAM;  Surgeon: Blane Ohara, MD;  Location: [facility] CATH LAB;  Service: Cardiovascular;  Laterality: N/A;  . TOTAL HIP ARTHROPLASTY Right 02/07/2016   Procedure: RIGHT TOTAL HIP ARTHROPLASTY ANTERIOR APPROACH;  Surgeon: Mcarthur Rossetti, MD;  Location: [place];  Service: Orthopedics;  Laterality: Right;  . TRANSURETHRAL RESECTION OF PROSTATE       Current Outpatient Medications  Medication Sig Dispense Refill  . aspirin EC 81  MG tablet Take 81 mg by mouth daily.    [name] atorvastatin (LIPITOR) 40 MG tablet TAKE 1 TABLET BY MOUTH AT BEDTIME 90 tablet 6  . CARTIA XT 240 MG 24 hr capsule TAKE ONE CAPSULE BY MOUTH EVERY DAY 30 capsule 6  . furosemide (LASIX) 20 MG tablet TAKE 1 TABLET BY MOUTH AS NEEDED FOR LEG SWELLING 90 tablet 3  . Multiple Vitamin (MULTIVITAMIN) tablet Take 1 tablet by mouth at bedtime.     . naproxen (NAPROSYN) 500 MG tablet Take 500 mg by mouth 2 (two)  times daily with a meal.    . nitroGLYCERIN (NITROSTAT) 0.4 MG SL tablet Place 1 tablet (0.4 mg total) under the tongue every 5 (five) minutes as needed for chest pain. 25 tablet 3  . tadalafil (CIALIS) 5 MG tablet Take 5 mg by mouth as needed for erectile dysfunction.    . Tamsulosin HCl (FLOMAX) 0.4 MG CAPS Take 0.4 mg by mouth daily.     . valACYclovir (VALTREX) 500 MG tablet Take 500 mg by mouth as needed.   4  . metoprolol succinate (TOPROL-XL) 100 MG 24 hr tablet Take 1 tablet (100 mg total) by mouth daily. Take with or immediately following a meal. 90 tablet 1   No current facility-administered medications for this visit.     Allergies:   Patient has no active allergies.   Social History:  The patient  reports that  has never smoked. he has never used smokeless tobacco. He reports that he drinks alcohol. He reports that he does not use drugs.   Family History:  The patient's family history includes Hypertension in his father and mother; Lung cancer in his sister; Parkinson's disease in his father.   ROS:  Please see the history of present illness.   Otherwise, review of systems is positive for leg swelling.   All other systems are reviewed and negative.   PHYSICAL EXAM: VS:  BP 114/60   Pulse 62   Ht 5\' 7"  (1.702 m)   Wt 195 lb (88.5 kg)   BMI 30.54 kg/m  , BMI Body mass index is 30.54 kg/m. GEN: Well nourished, well developed, in no acute distress  HEENT: normal  Neck: no JVD, carotid bruits, or masses Cardiac: RRR; no murmurs, rubs, or gallops,no edema  Respiratory:  clear to auscultation bilaterally, normal work of breathing GI: soft, nontender, nondistended, + BS MS: no deformity or atrophy  Skin: warm and dry Neuro:  Strength and sensation are intact Psych: euthymic mood, full affect  EKG:  EKG is not ordered today. Personal review of the ekg ordered 03/05/17 shows SVT, rate 115   Recent Labs: 03/05/2017: ALT 15; B Natriuretic Peptide 318.0; Hemoglobin 13.8;  Platelets 237; TSH 1.170 03/06/2017: BUN 18; Creatinine, Ser 0.98; Magnesium 2.1; Potassium 3.9; Sodium 139    Lipid Panel     Component Value Date/Time   CHOL 130 03/06/2017 0610   TRIG 262 (H) 03/06/2017 0610   HDL 28 (L) 03/06/2017 0610   CHOLHDL 4.6 03/06/2017 0610   VLDL 52 (H) 03/06/2017 0610   LDLCALC 50 03/06/2017 0610     Wt Readings from Last 3 Encounters:  07/08/17 195 lb (88.5 kg)  04/08/17 194 lb (88 kg)  03/04/17 189 lb (85.7 kg)      Other studies Reviewed: Additional studies/ records that were reviewed today include: TTE 03/06/17  Review of the above records today demonstrates:  - Left ventricle: The cavity size was normal. There was moderate  concentric and severe focal basal septal hypertrophy. Systolic   function was normal. The estimated ejection fraction was in the   range of 60% to 65%. Wall motion was normal; there were no   regional wall motion abnormalities. Features are consistent with   a pseudonormal left ventricular filling pattern, with concomitant   abnormal relaxation and increased filling pressure (grade 2   diastolic dysfunction). Doppler parameters are consistent with   high ventricular filling pressure. - Aortic valve: There was mild regurgitation. - Aortic root: The aortic root was mildly dilated (4.48 cm). - Left atrium: The atrium was severely dilated.   ASSESSMENT AND PLAN:  1.  Wide-complex tachycardia: Does have retrograde P waves and thus this could be SVT though VT is in the differential diagnosis with his history of coronary disease.  He was put on Toprol-XL which has greatly improved his symptoms.  No changes at this time.  2.  Coronary artery disease status post CABG with accelerating angina: Chest pain has worsened over the last few months.  His pain is similar to his chest pain that he had prior to his CABG.  We Teosha Casso plan for left heart catheterization.  3.  Hyperlipidemia: Continue Lipitor.    Current medicines are  reviewed at length with the patient today.   The patient does not have concerns regarding his medicines.  The following changes were made today: None  Labs/ tests ordered today include:  Orders Placed This Encounter  Procedures  . Basic Metabolic Panel (BMET)  . CBC w/Diff     Disposition:   FU with Phallon Haydu 6 months  Signed, Shaune Malacara Meredith Leeds, MD  07/08/2017 4:41 PM     [place] [address] [phone] (office) [phone] (fax)

## 2017-07-09 ENCOUNTER — Telehealth: Payer: Self-pay | Admitting: *Deleted

## 2017-07-09 LAB — BASIC METABOLIC PANEL
BUN / CREAT RATIO: 21 (ref 10–24)
BUN: 23 mg/dL (ref 8–27)
CO2: 23 mmol/L (ref 20–29)
CREATININE: 1.11 mg/dL (ref 0.76–1.27)
Calcium: 9 mg/dL (ref 8.6–10.2)
Chloride: 106 mmol/L (ref 96–106)
GFR calc Af Amer: 75 mL/min/{1.73_m2} (ref >59)
GFR, EST NON AFRICAN AMERICAN: 65 mL/min/{1.73_m2} (ref >59)
Glucose: 126 mg/dL — ABNORMAL HIGH (ref 65–99)
Potassium: 5 mmol/L (ref 3.5–5.2)
SODIUM: 144 mmol/L (ref 134–144)

## 2017-07-09 LAB — CBC WITH DIFFERENTIAL/PLATELET
BASOS: 1 %
Basophils Absolute: 0.1 10*3/uL (ref 0.0–0.2)
EOS (ABSOLUTE): 0.2 10*3/uL (ref 0.0–0.4)
EOS: 2 %
Hematocrit: 38.2 % (ref 37.5–51.0)
Hemoglobin: 13.1 g/dL (ref 13.0–17.7)
IMMATURE GRANULOCYTES: 1 %
Immature Grans (Abs): 0.1 10*3/uL (ref 0.0–0.1)
LYMPHS ABS: 1.5 10*3/uL (ref 0.7–3.1)
Lymphs: 15 %
MCH: 28.4 pg (ref 26.6–33.0)
MCHC: 34.3 g/dL (ref 31.5–35.7)
MCV: 83 fL (ref 79–97)
MONOS ABS: 1.1 10*3/uL — AB (ref 0.1–0.9)
Monocytes: 11 %
NEUTROS PCT: 70 %
Neutrophils Absolute: 6.7 10*3/uL (ref 1.4–7.0)
Platelets: 266 10*3/uL (ref 150–379)
RBC: 4.62 x10E6/uL (ref 4.14–5.80)
RDW: 15.7 % — AB (ref 12.3–15.4)
WBC: 9.6 10*3/uL (ref 3.4–10.8)

## 2017-07-09 NOTE — Telephone Encounter (Signed)
Pt contacted pre-catheterization scheduled at [facility] for: Thursday July 11, 2017 1:30 PM Verified arrival time and place: [facility] Entr[facility] A/[facility] [place] at: 11:30 AM Nothing to eat or drink after midnight night before cath.  Hold: Lasix AM of cath No Cialis until post cath  Except hold medications AM meds can be  taken pre-cath with sip of water including: ASA 81 mg AM of cath   Confirmed patient has responsible person to drive home post procedure and observe patient for 24 hours: yes

## 2017-07-11 ENCOUNTER — Encounter (HOSPITAL_COMMUNITY)
Admission: RE | Disposition: A | Discharge status: Home / Self Care | Payer: Self-pay | Source: Ambulatory Visit | Attending: Cardiovascular Disease

## 2017-07-11 ENCOUNTER — Ambulatory Visit (HOSPITAL_COMMUNITY)
Admission: RE | Admit: 2017-07-11 | Discharge: 2017-07-11 | Disposition: A | Discharge status: Home / Self Care | Payer: Medicare Other | Source: Ambulatory Visit | Attending: Cardiovascular Disease | Admitting: Cardiovascular Disease

## 2017-07-11 DIAGNOSIS — I110 Hypertensive heart disease with heart failure: Secondary | ICD-10-CM | POA: Insufficient documentation

## 2017-07-11 DIAGNOSIS — I252 Old myocardial infarction: Secondary | ICD-10-CM | POA: Insufficient documentation

## 2017-07-11 DIAGNOSIS — I209 Angina pectoris, unspecified: Secondary | ICD-10-CM

## 2017-07-11 DIAGNOSIS — I200 Unstable angina: Secondary | ICD-10-CM

## 2017-07-11 DIAGNOSIS — E782 Mixed hyperlipidemia: Secondary | ICD-10-CM | POA: Insufficient documentation

## 2017-07-11 DIAGNOSIS — I2582 Chronic total occlusion of coronary artery: Secondary | ICD-10-CM | POA: Insufficient documentation

## 2017-07-11 DIAGNOSIS — I5032 Chronic diastolic (congestive) heart failure: Secondary | ICD-10-CM | POA: Diagnosis not present

## 2017-07-11 DIAGNOSIS — Z7982 Long term (current) use of aspirin: Secondary | ICD-10-CM | POA: Insufficient documentation

## 2017-07-11 DIAGNOSIS — I471 Supraventricular tachycardia: Secondary | ICD-10-CM | POA: Insufficient documentation

## 2017-07-11 DIAGNOSIS — N400 Enlarged prostate without lower urinary tract symptoms: Secondary | ICD-10-CM | POA: Diagnosis not present

## 2017-07-11 DIAGNOSIS — I25119 Atherosclerotic heart disease of native coronary artery with unspecified angina pectoris: Secondary | ICD-10-CM

## 2017-07-11 DIAGNOSIS — I2584 Coronary atherosclerosis due to calcified coronary lesion: Secondary | ICD-10-CM | POA: Insufficient documentation

## 2017-07-11 DIAGNOSIS — I25720 Atherosclerosis of autologous artery coronary artery bypass graft(s) with unstable angina pectoris: Secondary | ICD-10-CM | POA: Diagnosis not present

## 2017-07-11 DIAGNOSIS — I25709 Atherosclerosis of coronary artery bypass graft(s), unspecified, with unspecified angina pectoris: Secondary | ICD-10-CM

## 2017-07-11 DIAGNOSIS — I2510 Atherosclerotic heart disease of native coronary artery without angina pectoris: Secondary | ICD-10-CM | POA: Diagnosis present

## 2017-07-11 DIAGNOSIS — I712 Thoracic aortic aneurysm, without rupture: Secondary | ICD-10-CM | POA: Diagnosis not present

## 2017-07-11 HISTORY — PX: LEFT HEART CATH AND CORS/GRAFTS ANGIOGRAPHY: CATH118250

## 2017-07-11 LAB — PROTIME-INR
INR: 1.17
Prothrombin Time: 14.8 seconds (ref 11.4–15.2)

## 2017-07-11 SURGERY — LEFT HEART CATH AND CORS/GRAFTS ANGIOGRAPHY
Anesthesia: LOCAL

## 2017-07-11 MED ORDER — FENTANYL CITRATE (PF) 100 MCG/2ML IJ SOLN
INTRAMUSCULAR | Status: AC
  Filled 2017-07-11: qty 2

## 2017-07-11 MED ORDER — SODIUM CHLORIDE 0.9 % WEIGHT BASED INFUSION
3.0000 mL/kg/h | INTRAVENOUS | Status: AC
  Administered 2017-07-11: 3 mL/kg/h via INTRAVENOUS

## 2017-07-11 MED ORDER — ACETAMINOPHEN 325 MG PO TABS: 650.0000 mg | ORAL_TABLET | ORAL | Status: DC | PRN

## 2017-07-11 MED ORDER — LIDOCAINE HCL (PF) 1 % IJ SOLN
INTRAMUSCULAR | Status: AC
  Filled 2017-07-11: qty 30

## 2017-07-11 MED ORDER — SODIUM CHLORIDE 0.9 % IV SOLN: 250.0000 mL | INTRAVENOUS | Status: DC | PRN

## 2017-07-11 MED ORDER — SODIUM CHLORIDE 0.9% FLUSH: 3.0000 mL | INTRAVENOUS | Status: DC | PRN

## 2017-07-11 MED ORDER — LIDOCAINE HCL (PF) 1 % IJ SOLN
INTRAMUSCULAR | Status: DC | PRN
  Administered 2017-07-11: 20 mL

## 2017-07-11 MED ORDER — HEPARIN (PORCINE) IN NACL 2-0.9 UNIT/ML-% IJ SOLN
INTRAMUSCULAR | Status: AC | PRN
  Administered 2017-07-11 (×2): 500 mL

## 2017-07-11 MED ORDER — ASPIRIN 81 MG PO CHEW: 81.0000 mg | CHEWABLE_TABLET | ORAL | Status: DC

## 2017-07-11 MED ORDER — ONDANSETRON HCL 4 MG/2ML IJ SOLN: 4.0000 mg | Freq: Four times a day (QID) | INTRAMUSCULAR | Status: DC | PRN

## 2017-07-11 MED ORDER — FENTANYL CITRATE (PF) 100 MCG/2ML IJ SOLN
INTRAMUSCULAR | Status: DC | PRN
  Administered 2017-07-11: 25 ug via INTRAVENOUS

## 2017-07-11 MED ORDER — ISOSORBIDE MONONITRATE ER 30 MG PO TB24: 30.0000 mg | ORAL_TABLET | Freq: Every day | ORAL | 11 refills | Status: DC

## 2017-07-11 MED ORDER — SODIUM CHLORIDE 0.9 % IV SOLN: INTRAVENOUS | Status: AC

## 2017-07-11 MED ORDER — HEPARIN (PORCINE) IN NACL 2-0.9 UNIT/ML-% IJ SOLN
INTRAMUSCULAR | Status: AC
  Filled 2017-07-11: qty 1000

## 2017-07-11 MED ORDER — MIDAZOLAM HCL 2 MG/2ML IJ SOLN
INTRAMUSCULAR | Status: AC
  Filled 2017-07-11: qty 2

## 2017-07-11 MED ORDER — ISOSORBIDE MONONITRATE ER 30 MG PO TB24
30.0000 mg | ORAL_TABLET | Freq: Every day | ORAL | Status: DC
  Administered 2017-07-11: 30 mg via ORAL
  Filled 2017-07-11: qty 1

## 2017-07-11 MED ORDER — SODIUM CHLORIDE 0.9 % WEIGHT BASED INFUSION: 1.0000 mL/kg/h | INTRAVENOUS | Status: DC

## 2017-07-11 MED ORDER — IOPAMIDOL (ISOVUE-370) INJECTION 76%
INTRAVENOUS | Status: AC
  Filled 2017-07-11: qty 150

## 2017-07-11 MED ORDER — SODIUM CHLORIDE 0.9% FLUSH: 3.0000 mL | Freq: Two times a day (BID) | INTRAVENOUS | Status: DC

## 2017-07-11 MED ORDER — MIDAZOLAM HCL 2 MG/2ML IJ SOLN
INTRAMUSCULAR | Status: DC | PRN
  Administered 2017-07-11: 2 mg via INTRAVENOUS

## 2017-07-11 MED ORDER — IOPAMIDOL (ISOVUE-370) INJECTION 76%
INTRAVENOUS | Status: DC | PRN
  Administered 2017-07-11: 125 mL via INTRA_ARTERIAL

## 2017-07-11 SURGICAL SUPPLY — 10 items
CATH INFINITI 5 FR IM (CATHETERS) ×1 IMPLANT
CATH INFINITI 5FR MULTPACK ANG (CATHETERS) ×1 IMPLANT
KIT HEART LEFT (KITS) ×2 IMPLANT
PACK CARDIAC CATHETERIZATION (CUSTOM PROCEDURE TRAY) ×2 IMPLANT
SHEATH PINNACLE 5F 10CM (SHEATH) ×1 IMPLANT
SYR MEDRAD MARK V 150ML (SYRINGE) ×2 IMPLANT
TRANSDUCER W/STOPCOCK (MISCELLANEOUS) ×2 IMPLANT
TUBING CIL FLEX 10 FLL-RA (TUBING) ×2 IMPLANT
WIRE EMERALD 3MM-J .035X150CM (WIRE) ×1 IMPLANT
WIRE EMERALD 3MM-J .035X260CM (WIRE) ×1 IMPLANT

## 2017-07-11 NOTE — Progress Notes (Signed)
Site area: rt groin fa sheath Site Prior to Removal:  Level 0 Pressure Applied For:  20 minutes Manual:   yes Patient Status During Pull:  stable Post Pull Site:  Level  0 Post Pull Instructions Given:  yes Post Pull Pulses Present: palpable rt dp Dressing Applied:  Gauze and tegaderm Bedrest begins @ 1155 Comments:

## 2017-07-11 NOTE — Interval H&P Note (Signed)
Cath Lab Visit (complete for each Cath Lab visit)  Clinical Evaluation Leading to the Procedure:   ACS: No.  Non-ACS:    Anginal Classification: CCS III  Anti-ischemic medical therapy: Maximal Therapy (2 or more classes of medications)  Non-Invasive Test Results: No non-invasive testing performed  Prior CABG: Previous CABG      History and Physical Interval Note:  07/11/2017 4:08 PM  Margaretha Glassing  has presented today for surgery, with the diagnosis of cp  The various methods of treatment have been discussed with the patient and family. After consideration of risks, benefits and other options for treatment, the patient has consented to  Procedure(s): LEFT HEART CATH AND CORS/GRAFTS ANGIOGRAPHY (N/A) as a surgical intervention .  The patient's history has been reviewed, patient examined, no change in status, stable for surgery.  I have reviewed the patient's chart and labs.  Questions were answered to the patient's satisfaction.     Shelva Majestic

## 2017-07-11 NOTE — Discharge Instructions (Signed)
Angiogram, Care After °This sheet gives you information about how to care for yourself after your procedure. Your health care provider may also give you more specific instructions. If you have problems or questions, contact your health care provider. °What can I expect after the procedure? °After the procedure, it is common to have bruising and tenderness at the catheter insertion area. °Follow these instructions at home: °Insertion site care °· Follow instructions from your health care provider about how to take care of your insertion site. Make sure you: °? Wash your hands with soap and water before you change your bandage (dressing). If soap and water are not available, use hand sanitizer. °? Change your dressing as told by your health care provider. °? Leave stitches (sutures), skin glue, or adhesive strips in place. These skin closures may need to stay in place for 2 weeks or longer. If adhesive strip edges start to loosen and curl up, you may trim the loose edges. Do not remove adhesive strips completely unless your health care provider tells you to do that. °· Do not take baths, swim, or use a hot tub until your health care provider approves. °· You may shower 24-48 hours after the procedure or as told by your health care provider. °? Gently wash the site with plain soap and water. °? Pat the area dry with a clean towel. °? Do not rub the site. This may cause bleeding. °· Do not apply powder or lotion to the site. Keep the site clean and dry. °· Check your insertion site every day for signs of infection. Check for: °? Redness, swelling, or pain. °? Fluid or blood. °? Warmth. °? Pus or a bad smell. °Activity °· Rest as told by your health care provider, usually for 1-2 days. °· Do not lift anything that is heavier than 10 lbs. (4.5 kg) or as told by your health care provider. °· Do not drive for 24 hours if you were given a medicine to help you relax (sedative). °· Do not drive or use heavy machinery while  taking prescription pain medicine. °General instructions °· Return to your normal activities as told by your health care provider, usually in about a week. Ask your health care provider what activities are safe for you. °· If the catheter site starts bleeding, lie flat and put pressure on the site. If the bleeding does not stop, get help right away. This is a medical emergency. °· Drink enough fluid to keep your urine clear or pale yellow. This helps flush the contrast dye from your body. °· Take over-the-counter and prescription medicines only as told by your health care provider. °· Keep all follow-up visits as told by your health care provider. This is important. °Contact a health care provider if: °· You have a fever or chills. °· You have redness, swelling, or pain around your insertion site. °· You have fluid or blood coming from your insertion site. °· The insertion site feels warm to the touch. °· You have pus or a bad smell coming from your insertion site. °· You have bruising around the insertion site. °· You notice blood collecting in the tissue around the catheter site (hematoma). The hematoma may be painful to the touch. °Get help right away if: °· You have severe pain at the catheter insertion area. °· The catheter insertion area swells very fast. °· The catheter insertion area is bleeding, and the bleeding does not stop when you hold steady pressure on the area. °·   The area near or just beyond the catheter insertion site becomes pale, cool, tingly, or numb. These symptoms may represent a serious problem that is an emergency. Do not wait to see if the symptoms will go away. Get medical help right away. Call your local emergency services (911 in the [place] Do not drive yourself to the hospital. Summary  After the procedure, it is common to have bruising and tenderness at the catheter insertion area.  After the procedure, it is important to rest and drink plenty of fluids.  Do not take baths,  swim, or use a hot tub until your health care provider says it is okay to do so. You may shower 24-48 hours after the procedure or as told by your health care provider.  If the catheter site starts bleeding, lie flat and put pressure on the site. If the bleeding does not stop, get help right away. This is a medical emergency. This information is not intended to replace advice given to you by your health care provider. Make sure you discuss any questions you have with your health care provider. Document Released: 11/16/2004 Document Revised: 04/04/2016 Document Reviewed: 04/04/2016 [employer] Interactive Patient Education  2018 [place]. Isosorbide Mononitrate extended-release tablets What is this medicine? ISOSORBIDE MONONITRATE (eye soe SOR bide mon oh NYE trate) is a vasodilator. It relaxes blood vessels, increasing the blood and oxygen supply to your heart. This medicine is used to prevent chest pain caused by angina. It will not help to stop an episode of chest pain. This medicine may be used for other purposes; ask your health care provider or pharmacist if you have questions. COMMON BRAND NAME(S): Imdur, Isotrate ER What should I tell my health care provider before I take this medicine? They need to know if you have any of these conditions: -previous heart attack or heart failure -an unusual or allergic reaction to isosorbide mononitrate, nitrates, other medicines, foods, dyes, or preservatives -pregnant or trying to get pregnant -breast-feeding How should I use this medicine? Take this medicine by mouth with a glass of water. Follow the directions on the prescription label. Do not crush or chew. Take your medicine at regular intervals. Do not take your medicine more often than directed. Do not stop taking this medicine except on the advice of your doctor or health care professional. Talk to your pediatrician regarding the use of this medicine in children. Special care may be  needed. Overdosage: If you think you have taken too much of this medicine contact a poison control center or emergency room at once. NOTE: This medicine is only for you. Do not share this medicine with others. What if I miss a dose? If you miss a dose, take it as soon as you can. If it is almost time for your next dose, take only that dose. Do not take double or extra doses. What may interact with this medicine? Do not take this medicine with any of the following medications: -medicines used to treat erectile dysfunction (ED) like avanafil, sildenafil, tadalafil, and vardenafil -riociguat This medicine may also interact with the following medications: -medicines for high blood pressure -other medicines for angina or heart failure This list may not describe all possible interactions. Give your health care provider a list of all the medicines, herbs, non-prescription drugs, or dietary supplements you use. Also tell them if you smoke, drink alcohol, or use illegal drugs. Some items may interact with your medicine. What should I watch for while using this medicine? Check  your heart rate and blood pressure regularly while you are taking this medicine. Ask your doctor or health care professional what your heart rate and blood pressure should be and when you should contact him or her. Tell your doctor or health care professional if you feel your medicine is no longer working. You may get dizzy. Do not drive, use machinery, or do anything that needs mental alertness until you know how this medicine affects you. To reduce the risk of dizzy or fainting spells, do not sit or stand up quickly, especially if you are an older patient. Alcohol can make you more dizzy, and increase flushing and rapid heartbeats. Avoid alcoholic drinks. Do not treat yourself for coughs, colds, or pain while you are taking this medicine without asking your doctor or health care professional for advice. Some ingredients may increase  your blood pressure. What side effects may I notice from receiving this medicine? Side effects that you should report to your doctor or health care professional as soon as possible: -bluish discoloration of lips, fingernails, or palms of hands -irregular heartbeat, palpitations -low blood pressure -nausea, vomiting -persistent headache -unusually weak or tired Side effects that usually do not require medical attention (report to your doctor or health care professional if they continue or are bothersome): -flushing of the face or neck -rash This list may not describe all possible side effects. Call your doctor for medical advice about side effects. You may report side effects to FDA at 1-800-FDA-1088. Where should I keep my medicine? Keep out of the reach of children. Store between 15 and 30 degrees C (59 and 86 degrees F). Keep container tightly closed. Throw away any unused medicine after the expiration date. NOTE: This sheet is a summary. It may not cover all possible information. If you have questions about this medicine, talk to your doctor, pharmacist, or health care provider.  2018 [employer] (2013-02-27 14:48:19)

## 2017-07-12 ENCOUNTER — Other Ambulatory Visit: Payer: Self-pay | Admitting: *Deleted

## 2017-07-12 ENCOUNTER — Encounter (HOSPITAL_COMMUNITY): Payer: Self-pay | Admitting: Cardiovascular Disease

## 2017-07-12 DIAGNOSIS — I25709 Atherosclerosis of coronary artery bypass graft(s), unspecified, with unspecified angina pectoris: Secondary | ICD-10-CM

## 2017-07-12 MED FILL — Heparin Sodium (Porcine) 2 Unit/ML in Sodium Chloride 0.9%: INTRAMUSCULAR | Qty: 1000 | Status: AC

## 2017-07-17 MED ORDER — VALACYCLOVIR 500 MG TABLET
ORAL_TABLET | ORAL | 5 refills | 0.00000 days | Status: CP
Start: 2017-07-17 — End: ?

## 2017-07-18 ENCOUNTER — Encounter: Payer: Self-pay | Admitting: Surgery

## 2017-07-18 ENCOUNTER — Other Ambulatory Visit: Payer: Self-pay

## 2017-07-18 ENCOUNTER — Institutional Professional Consult (permissible substitution): Payer: Medicare Other | Admitting: Surgery

## 2017-07-18 VITALS — BP 143/79 | HR 55 | Resp 18 | Ht 67.0 in | Wt 197.4 lb

## 2017-07-18 DIAGNOSIS — I25709 Atherosclerosis of coronary artery bypass graft(s), unspecified, with unspecified angina pectoris: Secondary | ICD-10-CM | POA: Diagnosis not present

## 2017-07-18 NOTE — Progress Notes (Signed)
Cardiothoracic Surgery Consultation  PCP is Sinda Du, MD Referring Provider is Troy Sine, MD  Chief Complaint  Patient presents with  . Coronary Artery Disease    New Pt Evaluation, Redo CABG, Cath 07/11/2017    HPI:  The patient is a 77 year old gentleman with hypertension, hyperlipidemia, atrial fibrillation and supraventricular tachycardia, chronic diastolic heart failure, and coronary disease who I had seen initially in June 2013 for a 5.1 cm ascending aortic aneurysm.  We decided to continue following this with a repeat scan in 6 months.  The patient had had prior history of coronary artery disease treated with stents and was followed by Dr. Verl Blalock.  He subsequently underwent coronary bypass graft surgery and replacement of his ascending aorta at [facility] by Dr. Anne Shutter in 2013.  He had a left internal mammary graft to the LAD, saphenous vein graft to the ramus, saphenous vein graft to the obtuse marginal, and a saphenous vein graft to the posterior lateral branch.  He said that he did well for about 2-1/2 years and then began having some episodic chest discomfort similar to his prior chest pain.  Over the past couple months he has had increasing episodes of chest discomfort as well as some exertional fatigue.  He was evaluated in October with chest pain and palpitations and has been followed by Dr. Curt Bears.  He presently describes episodes of squeezing substernal chest discomfort with exertion like walking upstairs.  He has had no associated shortness of breath.  The symptom symptoms are similar to what he had prior to his bypass surgery.  His last echocardiogram on 03/06/2017 showed an ejection fraction of 60-65% with grade 2 diastolic dysfunction.  There was mild aortic insufficiency and aortic root was mildly dilated at 4.5 cm.  There is moderate concentric left ventricular hypertrophy and severe focal basal septal hypertrophy.  He underwent cardiac catheterization by Dr. Claiborne Billings on  07/11/2017.  This showed a left ventricular ejection fraction of 50-55%.  There was severe native three-vessel coronary disease with an 80% proximal calcified LAD stenosis in the region of the first diagonal branch with a 90% ostial first diagonal stenosis.  The LAD has 60-70% diffuse stenosis after the second diagonal vessel with competitive filling beyond that from the left internal mammary graft.  There was total ostial occlusion of left circumflex coronary artery at the prior stenting site.  There were collaterals to 2 moderate-sized branches of the left circumflex.  There is a large dominant right coronary artery with diffuse calcification.  There is a 95% calcific rock-like stenosis in the region just beyond a large posterior descending branch that compromise to large posterior lateral branches.  All the previous saphenous vein grafts were occluded at their origin.  There is a patent left internal mammary graft to the mid LAD.  LVEDP was 22.  Past Medical History:  Diagnosis Date  . Ascending aortic aneurysm [facility])    Status post repair 12/2011 at [facility]  . Atrial fibrillation (HCC)    Perioperative  . BPH (benign prostatic hyperplasia)   . Chronic diastolic CHF (congestive heart failure) ([place])   . Chronic shortness of breath   . Coronary atherosclerosis of native coronary artery    a. Multivessel status post prior stenting and ultimately CABG 12/2011 at [facility] with with LIMA to LAD, SVG to PLB, SVG to ramus, SVG to OM . b. Last cath 06/2014 (following ischemic nuc) -> medical therapy, no feasible way to revascularize LCx territory.  . Essential hypertension, benign   .  Mixed hyperlipidemia   . Myocardial infarction ([place])   . Supraventricular tachycardia [facility])     Past Surgical History:  Procedure Laterality Date  . ASCENDING AORTIC ANEURYSM REPAIR  12/2011 [facility]   26 mm.Dacron graft  . CORONARY ARTERY BYPASS GRAFT  12/2011 [facility]   LIMA to LAD, SVG to PLB, SVG to ramus, SVG to OM  . HERNIA  REPAIR    . LEFT HEART CATH AND CORS/GRAFTS ANGIOGRAPHY N/A 07/11/2017   Procedure: LEFT HEART CATH AND CORS/GRAFTS ANGIOGRAPHY;  Surgeon: Troy Sine, MD;  Location: [place] CV LAB;  Service: Cardiovascular;  Laterality: N/A;  . LEFT HEART CATHETERIZATION WITH CORONARY ANGIOGRAM N/A 06/18/2014   Procedure: LEFT HEART CATHETERIZATION WITH CORONARY ANGIOGRAM;  Surgeon: Blane Ohara, MD;  Location: [facility] CATH LAB;  Service: Cardiovascular;  Laterality: N/A;  . TOTAL HIP ARTHROPLASTY Right 02/07/2016   Procedure: RIGHT TOTAL HIP ARTHROPLASTY ANTERIOR APPROACH;  Surgeon: Mcarthur Rossetti, MD;  Location: [place];  Service: Orthopedics;  Laterality: Right;  . TRANSURETHRAL RESECTION OF PROSTATE      Family History  Problem Relation Age of Onset  . Hypertension Father   . Parkinson's disease Father   . Hypertension Mother   . Lung cancer Sister     Social History Social History   Tobacco Use  . Smoking status: Never Smoker  . Smokeless tobacco: Never Used  . Tobacco comment: tobacco use - no  Substance Use Topics  . Alcohol use: Yes    Alcohol/week: 0.0 oz    Comment: Occasional  . Drug use: No    Current Outpatient Medications  Medication Sig Dispense Refill  . aspirin EC 81 MG tablet Take 81 mg by mouth daily.    [name] atorvastatin (LIPITOR) 40 MG tablet TAKE 1 TABLET BY MOUTH AT BEDTIME (Patient taking differently: TAKE 40 MG BY MOUTH AT BEDTIME) 90 tablet 6  . CARTIA XT 240 MG 24 hr capsule TAKE ONE CAPSULE BY MOUTH EVERY DAY (Patient taking differently: TAKE 240 MG BY MOUTH EVERY DAY) 30 capsule 6  . furosemide (LASIX) 20 MG tablet TAKE 1 TABLET BY MOUTH AS NEEDED FOR LEG SWELLING (Patient taking differently: TAKE 20 MG BY MOUTH AS DAILY NEEDED FOR LEG SWELLING) 90 tablet 3  . Multiple Vitamin (MULTIVITAMIN) tablet Take 1 tablet by mouth at bedtime.     . naproxen (NAPROSYN) 500 MG tablet Take 500 mg by mouth 2 (two) times daily as needed for moderate pain.     .  nitroGLYCERIN (NITROSTAT) 0.4 MG SL tablet Place 1 tablet (0.4 mg total) under the tongue every 5 (five) minutes as needed for chest pain. 25 tablet 3  . tadalafil (CIALIS) 5 MG tablet Take 5 mg by mouth as needed for erectile dysfunction.    . Tamsulosin HCl (FLOMAX) 0.4 MG CAPS Take 0.4 mg by mouth daily.     . valACYclovir (VALTREX) 500 MG tablet Take 500 mg by mouth daily as needed (for outbreaks).   4  . metoprolol succinate (TOPROL-XL) 100 MG 24 hr tablet Take 1 tablet (100 mg total) by mouth daily. Take with or immediately following a meal. 90 tablet 1   No current facility-administered medications for this visit.     No Known Allergies  Review of Systems  Constitutional: Positive for activity change and fatigue.  HENT: Negative.   Eyes: Negative.   Respiratory: Positive for chest tightness. Negative for shortness of breath.   Cardiovascular: Positive for chest pain, palpitations and leg swelling.  Gastrointestinal: Negative.   Endocrine: Negative.   Genitourinary: Negative.   Musculoskeletal: Negative.   Allergic/Immunologic: Negative.   Neurological: Negative.   Hematological: Negative.   Psychiatric/Behavioral: Negative.     BP (!) 143/79 (BP Location: Right Arm, Patient Position: Sitting, Cuff Size: Normal)   Pulse (!) 55   Resp 18   Ht 5\' 7"  (1.702 m)   Wt 197 lb 6.4 oz (89.5 kg)   SpO2 98% Comment: RA  BMI 30.92 kg/m  Physical Exam  Constitutional: He is oriented to person, place, and time. He appears well-developed and well-nourished. No distress.  HENT:  Head: Normocephalic and atraumatic.  Mouth/Throat: Oropharynx is clear and moist.  Eyes: Conjunctivae and EOM are normal. Pupils are equal, round, and reactive to light.  Neck: Normal range of motion. Neck supple. No JVD present. No thyromegaly present.  Cardiovascular: Normal rate, regular rhythm, normal heart sounds and intact distal pulses.  No murmur heard. Pulmonary/Chest: Effort normal and breath  sounds normal. No respiratory distress.  Prior sternotomy scar  Abdominal: Soft. Bowel sounds are normal. He exhibits no distension and no mass. There is no tenderness.  Musculoskeletal: Normal range of motion. He exhibits edema.  Lymphadenopathy:    He has no cervical adenopathy.  Neurological: He is alert and oriented to person, place, and time. He has normal strength. No cranial nerve deficit or sensory deficit.  Skin: Skin is warm and dry.  Psychiatric: He has a normal mood and affect.     Diagnostic Tests:  Result status: Final result                      *[facility] - [address], [place] [address]                            [phone]  ------------------------------------------------------------------- Transthoracic Echocardiography  Patient:    Royden, Bulman MR #:       [id number] Study Date: 03/06/2017 Gender:     M Age:        40 Height:     170.2 cm Weight:     85.7 kg BSA:        2.04 m^2 Pt. Status: Room:       [place] [id number]  FIEPPIRJJ    OACZYSA, YTKZSW [id number]  Mickel Baas  ATTENDING    Ezequiel Essex [id number]  PERFORMING   [facility], Forestine Na  SONOGRAPHER  Alvino Chapel, RCS  cc:  ------------------------------------------------------------------- LV EF: 60% -   65%  ------------------------------------------------------------------- Indications:      Chest pain 786.51.  ------------------------------------------------------------------- History:   PMH:  Acquired from the patient and from the patient&'s chart.  Coronary artery disease.  Risk factors:  Mixed hyperlipidemia. Hypertension.  ------------------------------------------------------------------- Study Conclusions  - Left ventricle: The cavity size was normal. There was moderate   concentric and severe focal basal septal hypertrophy. Systolic   function was  normal. The estimated ejection fraction was in the   range of 60% to 65%. Wall motion was normal; there were no   regional wall motion abnormalities.  Features are consistent with   a pseudonormal left ventricular filling pattern, with concomitant   abnormal relaxation and increased filling pressure (grade 2   diastolic dysfunction). Doppler parameters are consistent with   high ventricular filling pressure. - Aortic valve: There was mild regurgitation. - Aortic root: The aortic root was mildly dilated (4.48 cm). - Left atrium: The atrium was severely dilated.  ------------------------------------------------------------------- Labs, prior tests, procedures, and surgery: Coronary artery bypass grafting.  ------------------------------------------------------------------- Study data:  Comparison was made to the study of 05/27/2014.  Study status:  Routine.  Procedure:  The patient reported no pain pre or post test. Transthoracic echocardiography. Image quality was adequate.  Study completion:  There were no complications. Transthoracic echocardiography.  M-mode, complete 2D, spectral Doppler, and color Doppler.  Birthdate:  Patient birthdate: Oct 25, 1940.  Age:  Patient is 77 yr old.  Sex:  Gender: male. BMI: 29.6 kg/m^2.  Blood pressure:     140/72  Patient status: Inpatient.  Study date:  Study date: 03/06/2017. Study time: 08:23 AM.  Location:  Bedside.  -------------------------------------------------------------------  ------------------------------------------------------------------- Left ventricle:  The cavity size was normal. There was moderate concentric and severe focal basal septal hypertrophy. Systolic function was normal. The estimated ejection fraction was in the range of 60% to 65%. Wall motion was normal; there were no regional wall motion abnormalities. Features are consistent with a pseudonormal left ventricular filling pattern, with concomitant abnormal  relaxation and increased filling pressure (grade 2 diastolic dysfunction). Doppler parameters are consistent with high ventricular filling pressure.  ------------------------------------------------------------------- Aortic valve:   Trileaflet.  Doppler:   There was no stenosis. There was mild regurgitation.  ------------------------------------------------------------------- Aorta:  Aortic root: The aortic root was mildly dilated (4.48 cm).  ------------------------------------------------------------------- Mitral valve:   Structurally normal valve.   Leaflet separation was normal.  Doppler:  Transvalvular velocity was within the normal range. There was no evidence for stenosis. There was no regurgitation.    Peak gradient (D): 4 mm Hg.  ------------------------------------------------------------------- Left atrium:  The atrium was severely dilated.  ------------------------------------------------------------------- Atrial septum:  No defect or patent foramen ovale was identified.   ------------------------------------------------------------------- Right ventricle:  The cavity size was normal. Wall thickness was normal. Systolic function was normal.  ------------------------------------------------------------------- Pulmonic valve:    The valve appears to be grossly normal. Doppler:  There was no significant regurgitation.  ------------------------------------------------------------------- Tricuspid valve:   Structurally normal valve.   Leaflet separation was normal.  Doppler:  Transvalvular velocity was within the normal range. There was no significant regurgitation.  ------------------------------------------------------------------- Right atrium:  The atrium was normal in size.  ------------------------------------------------------------------- Pericardium:  There was no pericardial  effusion.  ------------------------------------------------------------------- Systemic veins: Inferior vena cava: The vessel was normal in size. The respirophasic diameter changes were in the normal range (>= 50%), consistent with normal central venous pressure.  ------------------------------------------------------------------- Measurements   Left ventricle                           Value        Reference  LV ID, ED, PLAX chordal                  47.7  mm     43 - 52  LV ID, ES, PLAX chordal                  31    mm     23 - 38  LV fx shortening, PLAX chordal  35    %      >=29  LV PW thickness, ED                      13.5  mm     ----------  IVS/LV PW ratio, ED                      1.21         <=1.3  Stroke volume, 2D                        77    ml     ----------  Stroke volume/bsa, 2D                    38    ml/m^2 ----------  LV ejection fraction, 1-p A4C            55    %      ----------  LV end-diastolic volume, 2-p             100   ml     ----------  LV end-systolic volume, 2-p              38    ml     ----------  LV ejection fraction, 2-p                62    %      ----------  Stroke volume, 2-p                       62    ml     ----------  LV end-diastolic volume/bsa, 2-p         49    ml/m^2 ----------  LV end-systolic volume/bsa, 2-p          19    ml/m^2 ----------  Stroke volume/bsa, 2-p                   30.3  ml/m^2 ----------  LV e&', lateral                           7.72  cm/s   ----------  LV E/e&', lateral                         12.25        ----------  LV e&', medial                            4.79  cm/s   ----------  LV E/e&', medial                          19.75        ----------  LV e&', average                           6.26  cm/s   ----------  LV E/e&', average                         15.12        ----------    Ventricular septum  Value        Reference  IVS thickness, ED                        16.3  mm      ----------    LVOT                                     Value        Reference  LVOT ID, S                               21    mm     ----------  LVOT area                                3.46  cm^2   ----------  LVOT peak velocity, S                    95.3  cm/s   ----------  LVOT mean velocity, S                    62.1  cm/s   ----------  LVOT VTI, S                              22.2  cm     ----------  LVOT peak gradient, S                    4     mm Hg  ----------    Left atrium                              Value        Reference  LA ID, A-P, ES                           42    mm     ----------  LA ID/bsa, A-P                           2.06  cm/m^2 <=2.2  LA volume, S                             102   ml     ----------  LA volume/bsa, S                         50.1  ml/m^2 ----------  LA volume, ES, 1-p A4C                   125   ml     ----------  LA volume/bsa, ES, 1-p A4C               61.4  ml/m^2 ----------  LA volume, ES, 1-p A2C                   78.2  ml     ----------  LA volume/bsa, ES, 1-p A2C  38.4  ml/m^2 ----------    Mitral valve                             Value        Reference  Mitral E-wave peak velocity              94.6  cm/s   ----------  Mitral A-wave peak velocity              75.7  cm/s   ----------  Mitral deceleration time         (H)     239   ms     150 - 230  Mitral peak gradient, D                  4     mm Hg  ----------  Mitral E/A ratio, peak                   1.2          ----------    Right atrium                             Value        Reference  RA ID, S-I, ES, A4C              (H)     60.2  mm     34 - 49  RA area, ES, A4C                         17.8  cm^2   8.3 - 19.5  RA volume, ES, A/L                       42.5  ml     ----------  RA volume/bsa, ES, A/L                   20.9  ml/m^2 ----------    Systemic veins                           Value        Reference  Estimated CVP                            3     mm Hg   ----------    Right ventricle                          Value        Reference  RV ID, ED, PLAX                          24.1  mm     19 - 38  TAPSE                                    14.7  mm     ----------  RV s&', lateral, S                        11.9  cm/s   ----------  Legend: (L)  and  (H)  mark values outside specified reference range.  ------------------------------------------------------------------- Prepared and Electronically Authenticated by  Kate Sable, MD 2018-10-24T10:33:01   Physicians   Panel Physicians Referring Physician Case Authorizing Physician  Troy Sine, MD (Primary)    Procedures   LEFT HEART CATH AND CORS/GRAFTS ANGIOGRAPHY  Conclusion     Prox RCA to Mid RCA lesion is 30% stenosed.  Mid RCA lesion is 20% stenosed.  Post Atrio lesion is 95% stenosed.  Ost Cx to Prox Cx lesion is 100% stenosed.  Ost 1st Diag lesion is 90% stenosed.  Prox LAD-1 lesion is 80% stenosed.  Prox LAD-2 lesion is 70% stenosed.  The left ventricular ejection fraction is 50-55% by visual estimate.  The left ventricular systolic function is normal.  LV end diastolic pressure is mildly elevated.   Normal global LV function with ejection fraction at 50-55%.  Severe native CAD with 80% proximal calcified LAD stenosis in the region of the first diagonal vessel with 90% ostial first diagonal stenosis and 60-70% diffuse LAD stenosis after the second diagonal vessel with competitive filling beyond this from the LIMA graft; total ostial occlusion of the left circumflex coronary artery at site of prior stenting.  There is collateralization to 2 branches of the circumflex via the left injection.  Large, very dominant RCA with diffuse calcification with 30% narrowing in the mid segment, a patent stent proximal to the acute margin with 20% intimal aplasia, and apparent significant calcific rocklike segment in the region just beyond the very large PDA takeoff and  prior to to large inferior LV and posterior lateral branches.  Occlusion of the previous 3 saphenous vein conduits at its origin in the aorta.  Patent LIMA graft supplying the mid LAD.  RECOMMENDATION: Angiographic findings were reviewed with Dr. Burt Knack.  It appears that the 90% very calcified "rock "in the dista RCA jeopardizes a very large distal RCA system.  This may not be amenable to atherectomy and and would be very difficult to stent which would also potentially jail these large branch vessels.  With his significant concomitant disease with total occlusion of the circumflex and collateralized large branches that are good surgical targets and in addition to his proximal LAD and diagonal vessel stenosis outpatient surgical consultation will be initially obtained for consideration of redo CABG revascularization.  Isosorbide will be added to his medical regimen.   Indications   Angina pectoris ([place]) [I20.9 (ICD-10-CM)]  Coronary artery disease involving coronary bypass graft of native heart with other forms of angina pectoris ([place]) [I25.708 (ICD-10-CM)]  Procedural Details/Technique   Technical Details Mr. Hendrik Donath is a 77 year old male who underwent CABG revascularization surgery [place] with a LIMA to the LAD, SVG to PLB, SVG to ramus, SVG to OM in conjunction with ascending aortic aneurysm repair in 2013. In 2016. He underwent cardiac catheterization by Dr. Burt Knack and all vein grafts were occluded. He had a patent RCA stent and patent LIMA graft. There was the beginnings of calcification to the distal RCA beyond the PDA. His native circumflex was occluded. Recently, the patient has noticed increasing exertional chest tightness, particularly with step climbing. He was seen by Dr. Curt Bears and was scheduled for repeat cardiac catheterization.  The patient arrived to the catheterization laboratory in the fasting state. His right femoral artery was punctured anteriorly and a 5  French sheath was inserted without difficulty. Diagnostic catheterization was done with 5 [place] Jud[place] for left and right coronary catheters.  A LIMA catheter was used for selective angiography in the internal mammary artery. It was difficult to visualize each individual vein graft. Previous aortography at his last cath had shown all these grafts to be occluded. A 5 French pigtail catheter was used for left ventriculography. Angiographic findings were reviewed with Dr. Burt Knack. Hemostasis was obtained by direct manual pressure. The patient tolerated the procedure well, returned to his room in stable condition.   Estimated blood loss <50 mL.  During this procedure the patient was administered the following to achieve and maintain moderate conscious sedation: Versed 2 mg, Fentanyl 25 mcg, while the patient's heart rate, blood pressure, and oxygen saturation were continuously monitored. The period of conscious sedation was 49 minutes, of which I was present face-to-face 100% of this time.  Coronary Findings   Diagnostic  Dominance: Right  Left Anterior Descending  Prox LAD-1 lesion 80% stenosed  Prox LAD-1 lesion is 80% stenosed.  Prox LAD-2 lesion 70% stenosed  Prox LAD-2 lesion is 70% stenosed.  First Diagonal Branch  Ost 1st Diag lesion 90% stenosed  Ost 1st Diag lesion is 90% stenosed.  Left Circumflex  Ost Cx to Prox Cx lesion 100% stenosed  Ost Cx to Prox Cx lesion is 100% stenosed. The lesion was previously treated.  Right Coronary Artery  Prox RCA to Mid RCA lesion 30% stenosed  Prox RCA to Mid RCA lesion is 30% stenosed.  Mid RCA lesion 20% stenosed  Mid RCA lesion is 20% stenosed. The lesion was previously treated.  Right Posterior Atrioventricular Branch  Post Atrio lesion 95% stenosed  Post Atrio lesion is 95% stenosed.  LIMA Graft to Mid LAD  Intervention   No interventions have been documented.  Left Heart   Left Ventricle The left ventricular size is normal. The left  ventricular systolic function is normal. LV end diastolic pressure is mildly elevated. The left ventricular ejection fraction is 50-55% by visual estimate. No regional wall motion abnormalities. LVEDP 22 mmHg.  Coronary Diagrams   Diagnostic Diagram       Implants     No implant documentation for this case.  MERGE Images   Show images for CARDIAC CATHETERIZATION   Link to Procedure Log   Procedure Log    Hemo Data    Most Recent Value  AO Systolic Pressure 154 mmHg  AO Diastolic Pressure 62 mmHg  AO Mean 88 mmHg  LV Systolic Pressure 008 mmHg  LV Diastolic Pressure 5 mmHg  LV EDP 22 mmHg  Arterial Occlusion Pressure Extended Systolic Pressure 676 mmHg  Arterial Occlusion Pressure Extended Diastolic Pressure 66 mmHg  Arterial Occlusion Pressure Extended Mean Pressure 97 mmHg  Left Ventricular Apex Extended Systolic Pressure 195 mmHg  Left Ventricular Apex Extended Diastolic Pressure 10 mmHg  Left Ventricular Apex Extended EDP Pressure 22 mmHg     Impression:  This 77 year old gentleman has severe multivessel coronary disease with occluded saphenous vein grafts and a patent left internal mammary graft to the LAD status post coronary bypass graft surgery in 2013.  He has 2 moderate sized obtuse marginal branches and a large dominant distal right coronary artery that are suitable for grafting.  He has progressive anginal symptoms of angina and a large dominant right coronary artery that is at risk due to a calcified rock-like lesion in the distal right coronary artery just beyond the posterior descending branch.  Cardiology does not feel that PCI is a good option and therefore I think a redo coronary bypass graft surgery is  indicated.  He will require vein mapping of his lower extremities to see if there is any suitable saphenous vein left.  He will also require upper extremity arterial Dopplers to decide if a radial artery could be used.  His right internal mammary artery may be  suitable.  I will try to obtain a copy of his operative note to see where the saphenous vein was harvested from.  I can only find one small incision medial to the right knee.  He will also require a CTA of the chest to reassess his aorta since he had a prior ascending aortic aneurysm replacement at the time of his coronary bypass surgery.  His aortic root was noted to be 4.5 cm at the sinus level by echo in 02/2017.  I reviewed all this with him and answered his questions.  He is in agreement to proceed with further evaluation.   Plan:  1.  Obtain operative note from [facility] from 2013. 2.  Vein mapping of both lower extremities to assess for remaining saphenous vein. 3.  Upper extremity arterial Doppler exam to assess the radial arteries as conduits. 4.  CTA of the chest to assess the remaining aorta. 5.  Return to the office to see me after these studies are done so that we can review the results and discuss surgical options.  I spent 60 minutes performing this consultation and > 50% of this time was spent face to face counseling and coordinating the care of this patient's severe multivessel coronary disease. Gaye Pollack, MD [facility] [phone]

## 2017-07-19 ENCOUNTER — Other Ambulatory Visit: Payer: Self-pay | Admitting: Surgery

## 2017-07-19 DIAGNOSIS — I712 Thoracic aortic aneurysm, without rupture, unspecified: Secondary | ICD-10-CM

## 2017-07-19 DIAGNOSIS — I25709 Atherosclerosis of coronary artery bypass graft(s), unspecified, with unspecified angina pectoris: Secondary | ICD-10-CM

## 2017-07-22 ENCOUNTER — Encounter (HOSPITAL_COMMUNITY): Payer: Medicare Other

## 2017-07-22 ENCOUNTER — Inpatient Hospital Stay (HOSPITAL_COMMUNITY): Admission: RE | Admit: 2017-07-22 | Payer: Medicare Other | Source: Ambulatory Visit

## 2017-07-23 ENCOUNTER — Other Ambulatory Visit: Payer: Self-pay | Admitting: Surgery

## 2017-07-23 ENCOUNTER — Other Ambulatory Visit: Payer: Self-pay | Admitting: *Deleted

## 2017-07-23 DIAGNOSIS — Z951 Presence of aortocoronary bypass graft: Secondary | ICD-10-CM

## 2017-07-24 ENCOUNTER — Ambulatory Visit
Admission: RE | Admit: 2017-07-24 | Discharge: 2017-07-24 | Disposition: A | Discharge status: Home / Self Care | Payer: Medicare Other | Source: Ambulatory Visit | Attending: Surgery | Admitting: Surgery

## 2017-07-24 ENCOUNTER — Ambulatory Visit (HOSPITAL_COMMUNITY)
Admission: RE | Admit: 2017-07-24 | Discharge: 2017-07-24 | Disposition: A | Discharge status: Home / Self Care | Payer: Medicare Other | Source: Ambulatory Visit | Attending: Surgery | Admitting: Surgery

## 2017-07-24 ENCOUNTER — Ambulatory Visit: Payer: Medicare Other | Admitting: Surgery

## 2017-07-24 ENCOUNTER — Ambulatory Visit (HOSPITAL_BASED_OUTPATIENT_CLINIC_OR_DEPARTMENT_OTHER)
Admission: RE | Admit: 2017-07-24 | Discharge: 2017-07-24 | Disposition: A | Discharge status: Home / Self Care | Payer: Medicare Other | Source: Ambulatory Visit | Attending: Surgery | Admitting: Surgery

## 2017-07-24 DIAGNOSIS — I712 Thoracic aortic aneurysm, without rupture, unspecified: Secondary | ICD-10-CM

## 2017-07-24 DIAGNOSIS — Z951 Presence of aortocoronary bypass graft: Secondary | ICD-10-CM

## 2017-07-24 DIAGNOSIS — I25709 Atherosclerosis of coronary artery bypass graft(s), unspecified, with unspecified angina pectoris: Secondary | ICD-10-CM | POA: Diagnosis not present

## 2017-07-24 MED ORDER — IOPAMIDOL (ISOVUE-370) INJECTION 76%
75.0000 mL | Freq: Once | INTRAVENOUS | Status: AC | PRN
  Administered 2017-07-24: 75 mL via INTRAVENOUS

## 2017-07-24 NOTE — Progress Notes (Signed)
Pre-CABG testing has been completed. 1-39% ICA stenosis bilaterally.  ABI Right 1.07 Left 1.17  Bilateral Lower Extremity Vein Map  Right Great Saphenous Vein  Segment Diameter Comment  1. Origin 4.4 mm   2. High Thigh mm   3. Mid Thigh mm   4. Low Thigh mm   5. At Knee mm   6. High Calf mm   7. Low Calf mm   8. Ankle mm    Left Great Saphenous Vein  Segment Diameter Comment  1. Origin 5.3 mm   2. High Thigh 5 mm Branch  3. Mid Thigh 3.7 mm Branch  4. Low Thigh 3.7 mm   5. At Knee 3.3 mm   6. High Calf 3.3 mm Branch  7. Low Calf 2.8 mm   8. Ankle 3 mm Branch   07/24/17 10:04 AM Carlos Levering RVT

## 2017-07-31 ENCOUNTER — Ambulatory Visit (INDEPENDENT_AMBULATORY_CARE_PROVIDER_SITE_OTHER): Payer: Medicare Other | Admitting: Surgery

## 2017-07-31 ENCOUNTER — Other Ambulatory Visit: Payer: Self-pay | Admitting: *Deleted

## 2017-07-31 ENCOUNTER — Encounter: Payer: Self-pay | Admitting: Surgery

## 2017-07-31 VITALS — BP 154/80 | HR 54 | Resp 20 | Ht 67.0 in | Wt 194.0 lb

## 2017-07-31 DIAGNOSIS — I2510 Atherosclerotic heart disease of native coronary artery without angina pectoris: Secondary | ICD-10-CM

## 2017-07-31 DIAGNOSIS — I25709 Atherosclerosis of coronary artery bypass graft(s), unspecified, with unspecified angina pectoris: Secondary | ICD-10-CM | POA: Diagnosis not present

## 2017-07-31 NOTE — Progress Notes (Signed)
HPI:  The patient returns today to review the results of his vein mapping, peripheral arterial Dopplers, and CTA of the chest.  His vein mapping shows that the left greater saphenous vein is present and patent and appears to be of adequate size to use as a bypass conduit.  The right greater saphenous vein was previously harvested.  The upper extremity peripheral arterial Dopplers show a normal palmar arch on the left with no reduction in flow with radial compression so I think the left radial artery could be used.  The palmar arch signal on the right side decreased with radial compression and therefore the right radial artery is not available.  His chest CTA shows the mid to distal ascending aorta has a maximum diameter of 4.4 cm beyond the aneurysm repair.  Current Outpatient Medications  Medication Sig Dispense Refill  . aspirin EC 81 MG tablet Take 81 mg by mouth daily.    [name] atorvastatin (LIPITOR) 40 MG tablet TAKE 1 TABLET BY MOUTH AT BEDTIME (Patient taking differently: TAKE 40 MG BY MOUTH AT BEDTIME) 90 tablet 6  . CARTIA XT 240 MG 24 hr capsule TAKE ONE CAPSULE BY MOUTH EVERY DAY (Patient taking differently: TAKE 240 MG BY MOUTH EVERY DAY) 30 capsule 6  . furosemide (LASIX) 20 MG tablet TAKE 1 TABLET BY MOUTH AS NEEDED FOR LEG SWELLING (Patient taking differently: TAKE 20 MG BY MOUTH AS DAILY NEEDED FOR LEG SWELLING) 90 tablet 3  . metoprolol succinate (TOPROL-XL) 100 MG 24 hr tablet Take 1 tablet (100 mg total) by mouth daily. Take with or immediately following a meal. 90 tablet 1  . Multiple Vitamin (MULTIVITAMIN) tablet Take 1 tablet by mouth at bedtime.     . naproxen (NAPROSYN) 500 MG tablet Take 500 mg by mouth 2 (two) times daily as needed for moderate pain.     . nitroGLYCERIN (NITROSTAT) 0.4 MG SL tablet Place 1 tablet (0.4 mg total) under the tongue every 5 (five) minutes as needed for chest pain. 25 tablet 3  . tadalafil (CIALIS) 5 MG tablet Take 5 mg by mouth as needed for  erectile dysfunction.    . Tamsulosin HCl (FLOMAX) 0.4 MG CAPS Take 0.4 mg by mouth daily.     . valACYclovir (VALTREX) 500 MG tablet Take 500 mg by mouth daily as needed (for outbreaks).   4   No current facility-administered medications for this visit.      Physical Exam: BP (!) 154/80   Pulse (!) 54   Resp 20   Ht 5\' 7"  (1.702 m)   Wt 194 lb (88 kg)   SpO2 98% Comment: RA  BMI 30.38 kg/m  He looks well. Cardiac exam shows a regular rate and rhythm with normal heart sounds.  There is no murmur. Lungs are clear. There is mild bilateral lower leg edema.  Diagnostic Tests:  PERIOPERATIVE VASCULAR EVALUATION  Indications: Pre-CABG.   Examination Guidelines: A complete evaluation includes B-mode imaging, spectral doppler, color doppler, and power doppler as needed of all accessible portions of each vessel. Bilateral testing is considered an integral part of a complete examination. Limited examinations for reoccurring indications may be performed as noted.   Right Carotid Findings: +----------+--------+-------+--------+----------------------+------------------+      PSV cm/sEDV  StenosisDescribe       Comments               cm/s                           +----------+--------+-------+--------+----------------------+------------------+  CCA Prox 127   15                  intimal thickening +----------+--------+-------+--------+----------------------+------------------+ CCA Distal61   10       calcific       intimal thickening +----------+--------+-------+--------+----------------------+------------------+ ICA Prox -58   -19      heterogenous and                              smooth                  +----------+--------+-------+--------+----------------------+------------------+ ICA Distal-65    -24                           +----------+--------+-------+--------+----------------------+------------------+ ECA    -111  -10      calcific                 +----------+--------+-------+--------+----------------------+------------------+  Portions of this table do not appear on this page.    +----------+--------+-------+--------+------------+      PSV cm/sEDV cmsDescribeArm Pressure +----------+--------+-------+--------+------------+ Subclavian98           142      +----------+--------+-------+--------+------------+  +---------+--------+---+--------+---+---------+ VertebralPSV cm/s-50EDV cm/s-16Antegrade +---------+--------+---+--------+---+---------+  Left Carotid Findings: +----------+--------+--------+--------+-----------------------+--------+      PSV cm/sEDV cm/sStenosisDescribe        Comments +----------+--------+--------+--------+-----------------------+--------+ CCA Prox 89   13       smooth and heterogenous     +----------+--------+--------+--------+-----------------------+--------+ CCA Distal-77   -14       smooth and heterogenous     +----------+--------+--------+--------+-----------------------+--------+ ICA Prox -73   -29                        +----------+--------+--------+--------+-----------------------+--------+ ICA Distal-54   -19                        +----------+--------+--------+--------+-----------------------+--------+ ECA    -116  -8                        +----------+--------+--------+--------+-----------------------+--------+   +----------+--------+--------+--------+------------+ SubclavianPSV cm/sEDV cm/sDescribeArm Pressure +----------+--------+--------+--------+------------+       124           145      +----------+--------+--------+--------+------------+  +---------+--------+---+--------+---+---------+ VertebralPSV cm/s-40EDV cm/s-12Antegrade +---------+--------+---+--------+---+---------+   ABI Findings: +--------+------------------+-----+---------+--------+ Right  Rt Pressure (mmHg)IndexWaveform Comment  +--------+------------------+-----+---------+--------+ [id number]           triphasic     +--------+------------------+-----+---------+--------+ PTA   155        1.07 triphasic     +--------+------------------+-----+---------+--------+ DP   149        1.03 triphasic     +--------+------------------+-----+---------+--------+  +--------+------------------+-----+---------+-------+ Left  Lt Pressure (mmHg)IndexWaveform Comment +--------+------------------+-----+---------+-------+ [id number]           triphasic     +--------+------------------+-----+---------+-------+ PTA   170        1.17 triphasic     +--------+------------------+-----+---------+-------+ DP   160        1.10 triphasic     +--------+------------------+-----+---------+-------+  +-------+---------------+----------------+ ABI/TBIToday's ABI/TBIPrevious ABI/TBI +-------+---------------+----------------+ Right 1.07               +-------+---------------+----------------+ Left  1.17               +-------+---------------+----------------+   Upper Extremity Doppler Findings: +------------+---------+----------+----------+----------+---------+------------+ Comments-RT Doppler-RPressure-R  Site  Pressure-LDoppler-LComments-LT        T  T          T     T           +------------+---------+----------+----------+----------+---------+------------+                  Subclavian                 +------------+---------+----------+----------+----------+---------+------------+                  Axillary                  +------------+---------+----------+----------+----------+---------+------------+       triphasic142     Brachial 145    triphasic       +------------+---------+----------+----------+----------+---------+------------+                  Forearm                  +------------+---------+----------+----------+----------+---------+------------+       triphasic      Radial      triphasic       +------------+---------+----------+----------+----------+---------+------------+       triphasic      Ulnar       triphasic       +------------+---------+----------+----------+----------+---------+------------+ Palmar              Palmar           Palmar    waveforms              Arch            waveforms   are                              remain    diminished                          within    greater than                         normal    fifty                             limits with  percent with                         radial and  radial                            ulnar     compression                          compression. and are                                   obliterated                                 with ulnar                                  compression.                                +------------+---------+----------+----------+----------+---------+------------+  Digit                   +------------+---------+----------+----------+----------+---------+------------+    Final Interpretation: Right Carotid: Velocities in the right ICA are consistent with a 1-39% stenosis.  Left Carotid: Velocities in the left ICA are consistent with a 1-39% stenosis. Vertebrals: Bilateral vertebral arteries demonstrate antegrade flow. Subclavians:  Right ABI: Resting right ankle-brachial index is within normal range. No evidence of significant right lower extremity arterial disease. Left ABI: Resting left ankle-brachial index is within normal range. No evidence of significant left lower extremity arterial disease.    Electronically signed by Harold Barban on 07/24/2017 at 6:15:17 PM.    LOWER EXTREMITY VEIN MAPPING  Indications: Pre-CABG  Examination Guidelines: A complete evaluation includes B-mode imaging, spectral doppler, color doppler, and power doppler as needed of all accessible portions of each vessel. Bilateral testing is considered an integral part of a complete examination. Limited examinations for reoccurring indications may be performed as noted.  +--------------+----------------+-------------------+--------------+-----------+  RT Diameter  RT Findings      GSV     LT Diameter LT Findings    (mm)                        (mm)          +--------------+----------------+-------------------+--------------+-----------+    4.40            Saphenofemoral     5.30                            Junction                 +--------------+----------------+-------------------+--------------+-----------+          not visualized  Proximal thigh     5.00    branching  +--------------+----------------+-------------------+--------------+-----------+         not visualized    Mid thigh      3.70    branching  +--------------+----------------+-------------------+--------------+-----------+         not visualized   Distal thigh     3.70          +--------------+----------------+-------------------+--------------+-----------+         not visualized     Knee       3.30          +--------------+----------------+-------------------+--------------+-----------+         not visualized    Prox calf      3.30    branching  +--------------+----------------+-------------------+--------------+-----------+         branching and    Mid calf      2.80                  not visualized                         +--------------+----------------+-------------------+--------------+-----------+         not visualized   Distal calf     3.00    branching  +--------------+----------------+-------------------+--------------+-----------+     Harold Barban  Electronically signed by Harold Barban on 07/24/2017 at 8:14:59 PM.   CLINICAL DATA:  History of ascending thoracic aortic aneurysm repair in 2013. Patient is currently asymptomatic.  EXAM: CT ANGIOGRAPHY CHEST WITH CONTRAST  TECHNIQUE: Multidetector CT imaging of the chest was performed using the standard protocol during bolus administration of intravenous contrast. Multiplanar CT image reconstructions and MIPs were obtained to evaluate the vascular anatomy.  CONTRAST:  75mL ISOVUE-370 IOPAMIDOL (ISOVUE-370) INJECTION 76%  COMPARISON:  CTA chest dated April 14, 2010.  FINDINGS: Cardiovascular: Prior ascending thoracic aortic aneurysm repair. Unchanged  aneurysmal dilatation of the ascending thoracic aorta distal to the prior aneurysm repair, measuring up to 4.4 cm. Normal heart size. No pericardial effusion. Coronary, aortic arch, and branch vessel atherosclerotic vascular disease. No central pulmonary embolism.  Mediastinum/Nodes: No enlarged mediastinal, hilar, or axillary lymph nodes. Bilateral thyroid nodules are unchanged. The trachea and esophagus demonstrate no significant findings.  Lungs/Pleura: Minimal bibasilar atelectasis. Scarring in the lingula. No focal consolidation, pleural effusion, or pneumothorax. No suspicious pulmonary nodule.  Upper Abdomen: Small hiatal hernia.  No acute abnormality.  Musculoskeletal: Interval increase in size of a now 10 mm sclerotic lesion in the left T5 pedicle, previously 3 mm. Prior healed median sternotomy. No chest wall abnormality. No acute or significant osseous findings.  Review of the MIP images confirms the above findings.  IMPRESSION: 1. Prior ascending thoracic aortic aneurysm repair. Unchanged aneurysmal dilatation of the ascending thoracic aorta distal to the aneurysm repair, measuring up to 4.4 cm. Recommend annual imaging followup by CTA or MRA. This recommendation follows 2010 ACCF/AHA/AATS/ACR/ASA/SCA/SCAI/SIR/STS/SVM Guidelines for the Diagnosis and Management of Patients with Thoracic Aortic Disease. Circulation. 2010; 121: T614-E315 2. Interval increase in size of a now 10 mm sclerotic lesion in the left T5 pedicle, previously 3 mm. This is nonspecific and may represent a bone island given the slow growth over past 8 years. Consider bone scan to evaluate for abnormal metabolic activity or other lesions. 3.  Aortic atherosclerosis (ICD10-I70.0).   Electronically Signed   By: Titus Dubin M.D.   On: 07/24/2017 12:13  Impression:  This 76 year old gentleman has severe native three-vessel coronary disease with a patent left internal mammary graft to  the LAD and occluded saphenous vein grafts.  He has 2 obtuse marginal branches and a large PDA and PL branch from the right coronary artery that appear suitable for grafting.  There may also be a diagonal branch that is suitable for grafting.  I think his best long-term prognosis is going to be with redo coronary artery bypass graft surgery although at higher risk due to the redo nature of the surgery and previous concomitant aneurysm repair.  He appears to have adequate conduit including his left radial artery, possibly right internal mammary artery, and greater saphenous vein from the left leg. I discussed the operative procedure with the patient and family including alternatives, benefits and risks; including but not limited to bleeding, blood transfusion, infection, stroke, myocardial infarction, graft failure, heart block requiring a permanent pacemaker, organ dysfunction, and death.  Margaretha Glassing understands and agrees to proceed.     Plan:  We will plan to do surgery on Tuesday, 08/06/2017.  I spent 15 minutes performing this established patient evaluation and > 50% of this time was spent face to face counseling and coordinating the care of this patient's severe multivessel coronary disease.    Gaye Pollack, MD [facility] [phone]

## 2017-08-01 ENCOUNTER — Encounter: Payer: Self-pay | Admitting: *Deleted

## 2017-08-01 ENCOUNTER — Ambulatory Visit: Admit: 2017-08-01 | Discharge: 2017-08-02 | Payer: MEDICARE

## 2017-08-01 DIAGNOSIS — L638 Other alopecia areata: Secondary | ICD-10-CM | POA: Diagnosis not present

## 2017-08-01 DIAGNOSIS — L639 Alopecia areata, unspecified: Principal | ICD-10-CM

## 2017-08-02 NOTE — Pre-Procedure Instructions (Addendum)
TEE RICHESON  08/02/2017      [facility] - [address], [place], [place] [address] [place] [address] Phone: [phone] Fax: [phone]    Your procedure is scheduled on 08-06-2017  Tuesday .  Report to [facility] Admitting at 5:30 A.M.   Call this number if you have problems the morning of surgery:  [phone]   Remember:  Do not eat food or drink liquids after midnight.   Take these medicines the morning of surgery with A SIP OF WATER   Cartia XT Metoprolol(Toprol XL) Nitroglycerin if needed Tamsulosin(Flomax)  STOP TAKING ANY ASPIRIN(UNLESS OTHERWISE INSTRUCTED BY YOUR SURGEON),ANTIINFLAMATORIES (IBUPROFEN,ALEVE,MOTRIN,ADVIL,GOODY'S POWDERS),HERBAL SUPPLEMENTS,FISH OIL,AND VITAMINS 5-7 DAYS PRIOR TO SURGERY    Do not wear jewelry, make-up or nail polish.  Do not wear lotions, powders, or perfumes, or deodorant.  Do not shave 48 hours prior to surgery.  Men may shave face and neck.  Do not bring valuables to the hospital.  [facility] is not responsible for any belongings or valuables.  Contacts, dentures or bridgework may not be worn into surgery.  Leave your suitcase in the car.  After surgery it may be brought to your room.  For patients admitted to the hospital, discharge time will be determined by your treatment team.  Patients discharged the day of surgery will not be allowed to drive home.   Special Instructions: [place] - Preparing for Surgery  Before surgery, you can play an important role.  Because skin is not sterile, your skin needs to be as free of germs as possible.  You can reduce the number of germs on you skin by washing with CHG (chlorahexidine gluconate) soap before surgery.  CHG is an antiseptic cleaner which kills germs and bonds with the skin to continue killing germs even after washing.  Please DO NOT use if you have an allergy to CHG or antibacterial soaps.  If your skin becomes  reddened/irritated stop using the CHG and inform your nurse when you arrive at Short Stay.  Do not shave (including legs and underarms) for at least 48 hours prior to the first CHG shower.  You may shave your face.  Please follow these instructions carefully:   1.  Shower with CHG Soap the night before surgery and the   morning of Surgery.  2.  If you choose to wash your hair, wash your hair first as usual with your normal shampoo.  3.  After you shampoo, rinse your hair and body thoroughly to remove the  Shampoo.  4.  Use CHG as you would any other liquid soap.  You can apply chg directly  to the skin and wash gently with scrungie or a clean washcloth.  5.  Apply the CHG Soap to your body ONLY FROM THE NECK DOWN.   Do not use on open wounds or open sores.  Avoid contact with your eyes,  ears, mouth and genitals (private parts).  Wash genitals (private parts) with your normal soap.  6.  Wash thoroughly, paying special attention to the area where your surgery will be performed.  7.  Thoroughly rinse your body with warm water from the neck down.  8.  DO NOT shower/wash with your normal soap after using and rinsing o  the CHG Soap.  9.  Pat yourself dry with a clean towel.            10.  Wear clean  pajamas.            11.  Place clean sheets on your bed the night of your first shower and do not sleep with pets.  Day of Surgery  Do not apply any lotions/deodorants the morning of surgery.  Please wear clean clothes to the hospital/surgery center.    Follow your doctors instructions regarding your Aspirin. If no instructions were given by your doctor ,then you will need to call the prescribing office to get instructions.  Please read over the following fact sheets that you were given. Pain Booklet, Coughing and Deep Breathing and Surgical Site Infection Prevention Incentive Spirometry

## 2017-08-05 ENCOUNTER — Ambulatory Visit (HOSPITAL_COMMUNITY)
Admission: RE | Admit: 2017-08-05 | Discharge: 2017-08-05 | Disposition: A | Discharge status: Home / Self Care | Payer: Medicare Other | Source: Ambulatory Visit | Attending: Surgery | Admitting: Surgery

## 2017-08-05 ENCOUNTER — Encounter (HOSPITAL_COMMUNITY)
Admission: RE | Admit: 2017-08-05 | Discharge: 2017-08-05 | Disposition: A | Discharge status: Home / Self Care | Payer: Medicare Other | Source: Ambulatory Visit | Attending: Surgery | Admitting: Surgery

## 2017-08-05 ENCOUNTER — Other Ambulatory Visit: Payer: Self-pay

## 2017-08-05 ENCOUNTER — Other Ambulatory Visit (HOSPITAL_COMMUNITY): Payer: Medicare Other

## 2017-08-05 ENCOUNTER — Encounter (HOSPITAL_COMMUNITY): Payer: Self-pay

## 2017-08-05 DIAGNOSIS — J90 Pleural effusion, not elsewhere classified: Secondary | ICD-10-CM | POA: Diagnosis not present

## 2017-08-05 DIAGNOSIS — I25110 Atherosclerotic heart disease of native coronary artery with unstable angina pectoris: Secondary | ICD-10-CM | POA: Diagnosis not present

## 2017-08-05 DIAGNOSIS — I700 Atherosclerosis of aorta: Secondary | ICD-10-CM | POA: Diagnosis not present

## 2017-08-05 DIAGNOSIS — Z01812 Encounter for preprocedural laboratory examination: Secondary | ICD-10-CM | POA: Insufficient documentation

## 2017-08-05 DIAGNOSIS — I110 Hypertensive heart disease with heart failure: Secondary | ICD-10-CM | POA: Diagnosis not present

## 2017-08-05 DIAGNOSIS — I10 Essential (primary) hypertension: Secondary | ICD-10-CM | POA: Diagnosis not present

## 2017-08-05 DIAGNOSIS — I2510 Atherosclerotic heart disease of native coronary artery without angina pectoris: Secondary | ICD-10-CM | POA: Insufficient documentation

## 2017-08-05 DIAGNOSIS — I2584 Coronary atherosclerosis due to calcified coronary lesion: Secondary | ICD-10-CM | POA: Diagnosis not present

## 2017-08-05 DIAGNOSIS — I252 Old myocardial infarction: Secondary | ICD-10-CM | POA: Diagnosis not present

## 2017-08-05 DIAGNOSIS — Z79899 Other long term (current) drug therapy: Secondary | ICD-10-CM | POA: Diagnosis not present

## 2017-08-05 DIAGNOSIS — Z9079 Acquired absence of other genital organ(s): Secondary | ICD-10-CM | POA: Diagnosis not present

## 2017-08-05 DIAGNOSIS — Z96641 Presence of right artificial hip joint: Secondary | ICD-10-CM | POA: Diagnosis not present

## 2017-08-05 DIAGNOSIS — R319 Hematuria, unspecified: Secondary | ICD-10-CM | POA: Diagnosis not present

## 2017-08-05 DIAGNOSIS — D62 Acute posthemorrhagic anemia: Secondary | ICD-10-CM | POA: Diagnosis not present

## 2017-08-05 DIAGNOSIS — Z955 Presence of coronary angioplasty implant and graft: Secondary | ICD-10-CM | POA: Diagnosis not present

## 2017-08-05 DIAGNOSIS — E782 Mixed hyperlipidemia: Secondary | ICD-10-CM | POA: Diagnosis not present

## 2017-08-05 DIAGNOSIS — J9811 Atelectasis: Secondary | ICD-10-CM

## 2017-08-05 DIAGNOSIS — Z01810 Encounter for preprocedural cardiovascular examination: Secondary | ICD-10-CM | POA: Diagnosis not present

## 2017-08-05 DIAGNOSIS — J95811 Postprocedural pneumothorax: Secondary | ICD-10-CM | POA: Diagnosis not present

## 2017-08-05 DIAGNOSIS — I25118 Atherosclerotic heart disease of native coronary artery with other forms of angina pectoris: Secondary | ICD-10-CM | POA: Diagnosis not present

## 2017-08-05 DIAGNOSIS — Z01811 Encounter for preprocedural respiratory examination: Secondary | ICD-10-CM

## 2017-08-05 DIAGNOSIS — Z8679 Personal history of other diseases of the circulatory system: Secondary | ICD-10-CM | POA: Diagnosis not present

## 2017-08-05 DIAGNOSIS — I5032 Chronic diastolic (congestive) heart failure: Secondary | ICD-10-CM | POA: Diagnosis not present

## 2017-08-05 DIAGNOSIS — Z01818 Encounter for other preprocedural examination: Secondary | ICD-10-CM

## 2017-08-05 DIAGNOSIS — I4891 Unspecified atrial fibrillation: Secondary | ICD-10-CM | POA: Diagnosis not present

## 2017-08-05 DIAGNOSIS — J939 Pneumothorax, unspecified: Secondary | ICD-10-CM | POA: Diagnosis not present

## 2017-08-05 DIAGNOSIS — E1165 Type 2 diabetes mellitus with hyperglycemia: Secondary | ICD-10-CM | POA: Diagnosis not present

## 2017-08-05 DIAGNOSIS — M1991 Primary osteoarthritis, unspecified site: Secondary | ICD-10-CM | POA: Diagnosis present

## 2017-08-05 DIAGNOSIS — I25810 Atherosclerosis of coronary artery bypass graft(s) without angina pectoris: Secondary | ICD-10-CM | POA: Diagnosis not present

## 2017-08-05 DIAGNOSIS — N400 Enlarged prostate without lower urinary tract symptoms: Secondary | ICD-10-CM | POA: Diagnosis not present

## 2017-08-05 DIAGNOSIS — I080 Rheumatic disorders of both mitral and aortic valves: Secondary | ICD-10-CM | POA: Diagnosis not present

## 2017-08-05 DIAGNOSIS — I371 Nonrheumatic pulmonary valve insufficiency: Secondary | ICD-10-CM | POA: Diagnosis not present

## 2017-08-05 HISTORY — DX: Unspecified osteoarthritis, unspecified site: M19.90

## 2017-08-05 LAB — URINALYSIS, ROUTINE W REFLEX MICROSCOPIC
BILIRUBIN URINE: NEGATIVE
GLUCOSE, UA: NEGATIVE mg/dL
HGB URINE DIPSTICK: NEGATIVE
KETONES UR: NEGATIVE mg/dL
Leukocytes, UA: NEGATIVE
NITRITE: NEGATIVE
PH: 6 (ref 5.0–8.0)
Protein, ur: NEGATIVE mg/dL
Specific Gravity, Urine: 1.016 (ref 1.005–1.030)

## 2017-08-05 LAB — COMPREHENSIVE METABOLIC PANEL
ALBUMIN: 4 g/dL (ref 3.5–5.0)
ALK PHOS: 71 U/L (ref 38–126)
ALT: 20 U/L (ref 17–63)
AST: 21 U/L (ref 15–41)
Anion gap: 10 (ref 5–15)
BUN: 18 mg/dL (ref 6–20)
CALCIUM: 9.3 mg/dL (ref 8.9–10.3)
CHLORIDE: 106 mmol/L (ref 101–111)
CO2: 22 mmol/L (ref 22–32)
Creatinine, Ser: 0.88 mg/dL (ref 0.61–1.24)
GFR calc Af Amer: >60 mL/min (ref >60)
GFR calc non Af Amer: >60 mL/min (ref >60)
GLUCOSE: 137 mg/dL — AB (ref 65–99)
Potassium: 4.1 mmol/L (ref 3.5–5.1)
Sodium: 138 mmol/L (ref 135–145)
Total Bilirubin: 0.9 mg/dL (ref 0.3–1.2)
Total Protein: 7 g/dL (ref 6.5–8.1)

## 2017-08-05 LAB — PULMONARY FUNCTION TEST
DL/VA % pred: 92 %
DL/VA: 4.05 ml/min/mmHg/L
DLCO UNC % PRED: 74 %
DLCO UNC: 20.95 ml/min/mmHg
FEF 25-75 PRE: 2.89 L/s
FEF 25-75 Post: 3.64 L/sec
FEF2575-%Change-Post: 25 %
FEF2575-%PRED-PRE: 151 %
FEF2575-%Pred-Post: 190 %
FEV1-%CHANGE-POST: 5 %
FEV1-%Pred-Post: 101 %
FEV1-%Pred-Pre: 95 %
FEV1-Post: 2.7 L
FEV1-Pre: 2.55 L
FEV1FVC-%Change-Post: 2 %
FEV1FVC-%PRED-PRE: 115 %
FEV6-%CHANGE-POST: 3 %
FEV6-%PRED-POST: 91 %
FEV6-%Pred-Pre: 89 %
FEV6-POST: 3.17 L
FEV6-Pre: 3.08 L
FEV6FVC-%CHANGE-POST: 0 %
FEV6FVC-%PRED-POST: 107 %
FEV6FVC-%Pred-Pre: 107 %
FVC-%Change-Post: 3 %
FVC-%Pred-Post: 85 %
FVC-%Pred-Pre: 83 %
FVC-Post: 3.18 L
FVC-Pre: 3.08 L
PRE FEV1/FVC RATIO: 83 %
Post FEV1/FVC ratio: 85 %
Post FEV6/FVC ratio: 100 %
Pre FEV6/FVC Ratio: 100 %
RV % pred: 82 %
RV: 1.97 L
TLC % PRED: 79 %
TLC: 5.11 L

## 2017-08-05 LAB — PROTIME-INR
INR: 1.12
PROTHROMBIN TIME: 14.4 s (ref 11.4–15.2)

## 2017-08-05 LAB — BLOOD GAS, ARTERIAL
Acid-Base Excess: 0.9 mmol/L (ref 0.0–2.0)
Bicarbonate: 24.9 mmol/L (ref 20.0–28.0)
DRAWN BY: 421801
FIO2: 21
O2 Saturation: 95.4 %
Patient temperature: 98.6
pCO2 arterial: 39.3 mmHg (ref 32.0–48.0)
pH, Arterial: 7.419 (ref 7.350–7.450)
pO2, Arterial: 78.7 mmHg — ABNORMAL LOW (ref 83.0–108.0)

## 2017-08-05 LAB — CBC
HCT: 41.4 % (ref 39.0–52.0)
HEMOGLOBIN: 13.6 g/dL (ref 13.0–17.0)
MCH: 28.7 pg (ref 26.0–34.0)
MCHC: 32.9 g/dL (ref 30.0–36.0)
MCV: 87.3 fL (ref 78.0–100.0)
Platelets: 225 10*3/uL (ref 150–400)
RBC: 4.74 MIL/uL (ref 4.22–5.81)
RDW: 15.1 % (ref 11.5–15.5)
WBC: 11 10*3/uL — ABNORMAL HIGH (ref 4.0–10.5)

## 2017-08-05 LAB — HEMOGLOBIN A1C
Hgb A1c MFr Bld: 5.8 % — ABNORMAL HIGH (ref 4.8–5.6)
Mean Plasma Glucose: 119.76 mg/dL

## 2017-08-05 LAB — SURGICAL PCR SCREEN
MRSA, PCR: NEGATIVE
STAPHYLOCOCCUS AUREUS: NEGATIVE

## 2017-08-05 LAB — APTT: APTT: 32 s (ref 24–36)

## 2017-08-05 LAB — ABO/RH: ABO/RH(D): A POS

## 2017-08-05 MED ORDER — ALBUTEROL SULFATE (2.5 MG/3ML) 0.083% IN NEBU
2.5000 mg | INHALATION_SOLUTION | Freq: Once | RESPIRATORY_TRACT | Status: AC
  Administered 2017-08-05: 2.5 mg via RESPIRATORY_TRACT

## 2017-08-05 MED ORDER — VANCOMYCIN HCL 10 G IV SOLR
1500.0000 mg | INTRAVENOUS | Status: DC
  Filled 2017-08-05: qty 1500

## 2017-08-05 MED ORDER — SODIUM CHLORIDE 0.9 % IV SOLN
30.0000 ug/min | INTRAVENOUS | Status: DC
  Filled 2017-08-05 (×2): qty 2

## 2017-08-05 MED ORDER — SODIUM CHLORIDE 0.9 % IV SOLN
750.0000 mg | INTRAVENOUS | Status: DC
  Filled 2017-08-05: qty 750

## 2017-08-05 MED ORDER — NITROGLYCERIN IN D5W 200-5 MCG/ML-% IV SOLN
2.0000 ug/min | INTRAVENOUS | Status: DC
  Filled 2017-08-05: qty 250

## 2017-08-05 MED ORDER — PLASMA-LYTE 148 IV SOLN
INTRAVENOUS | Status: AC
  Administered 2017-08-06: 500 mL
  Filled 2017-08-05 (×2): qty 2.5

## 2017-08-05 MED ORDER — SODIUM CHLORIDE 0.9 % IV SOLN
INTRAVENOUS | Status: DC
  Filled 2017-08-05: qty 1

## 2017-08-05 MED ORDER — DEXMEDETOMIDINE HCL IN NACL 400 MCG/100ML IV SOLN
0.1000 ug/kg/h | INTRAVENOUS | Status: DC
  Filled 2017-08-05: qty 100

## 2017-08-05 MED ORDER — TRANEXAMIC ACID (OHS) PUMP PRIME SOLUTION
2.0000 mg/kg | INTRAVENOUS | Status: DC
  Filled 2017-08-05: qty 1.81

## 2017-08-05 MED ORDER — DOPAMINE-DEXTROSE 3.2-5 MG/ML-% IV SOLN
0.0000 ug/kg/min | INTRAVENOUS | Status: AC
  Administered 2017-08-06: 3 ug/kg/min via INTRAVENOUS
  Filled 2017-08-05: qty 250

## 2017-08-05 MED ORDER — TRANEXAMIC ACID 1000 MG/10ML IV SOLN
1.5000 mg/kg/h | INTRAVENOUS | Status: DC
  Filled 2017-08-05 (×2): qty 25

## 2017-08-05 MED ORDER — EPINEPHRINE PF 1 MG/ML IJ SOLN
0.0000 ug/min | INTRAVENOUS | Status: DC
  Filled 2017-08-05: qty 4

## 2017-08-05 MED ORDER — SODIUM CHLORIDE 0.9 % IV SOLN
1.5000 g | INTRAVENOUS | Status: AC
  Administered 2017-08-06: .75 g via INTRAVENOUS
  Administered 2017-08-06: 1.5 g via INTRAVENOUS
  Filled 2017-08-05: qty 1.5

## 2017-08-05 MED ORDER — MAGNESIUM SULFATE 50 % IJ SOLN
40.0000 meq | INTRAMUSCULAR | Status: DC
  Filled 2017-08-05: qty 9.85

## 2017-08-05 MED ORDER — TRANEXAMIC ACID (OHS) BOLUS VIA INFUSION
15.0000 mg/kg | INTRAVENOUS | Status: AC
  Administered 2017-08-06: 1354.5 mg via INTRAVENOUS
  Filled 2017-08-05: qty 1355

## 2017-08-05 MED ORDER — POTASSIUM CHLORIDE 2 MEQ/ML IV SOLN
80.0000 meq | INTRAVENOUS | Status: DC
Start: 2017-08-06 — End: 2017-08-06
  Filled 2017-08-05: qty 40

## 2017-08-05 MED ORDER — SODIUM CHLORIDE 0.9 % IV SOLN
INTRAVENOUS | Status: DC
  Filled 2017-08-05: qty 30

## 2017-08-05 MED ORDER — MILRINONE LACTATE IN DEXTROSE 20-5 MG/100ML-% IV SOLN
0.1250 ug/kg/min | INTRAVENOUS | Status: DC
  Filled 2017-08-05: qty 100

## 2017-08-05 NOTE — Anesthesia Preprocedure Evaluation (Addendum)
Anesthesia Evaluation  Patient identified by MRN, date of birth, ID band Patient awake    Reviewed: Allergy & Precautions, H&P , NPO status , Patient's Chart, lab work & pertinent test results, reviewed documented beta blocker date and time   Airway Mallampati: II  TM Distance: >3 FB Neck ROM: Full    Dental no notable dental hx. (+) Teeth Intact, Dental Advisory Given   Pulmonary neg pulmonary ROS,    Pulmonary exam normal breath sounds clear to auscultation       Cardiovascular Exercise Tolerance: Good hypertension, Pt. on medications and Pt. on home beta blockers + CAD, + Past MI, + CABG and +CHF   Rhythm:Regular Rate:Normal     Neuro/Psych negative neurological ROS  negative psych ROS   GI/Hepatic negative GI ROS, Neg liver ROS,   Endo/Other  negative endocrine ROS  Renal/GU negative Renal ROS  negative genitourinary   Musculoskeletal  (+) Arthritis , Osteoarthritis,    Abdominal   Peds  Hematology negative hematology ROS (+)   Anesthesia Other Findings   Reproductive/Obstetrics negative OB ROS                            Anesthesia Physical Anesthesia Plan  ASA: IV  Anesthesia Plan: General   Post-op Pain Management:    Induction: Intravenous  PONV Risk Score and Plan: 2 and Treatment may vary due to age or medical condition and Midazolam  Airway Management Planned: Oral ETT  Additional Equipment: Arterial line, CVP, PA Cath, TEE and Ultrasound Guidance Line Placement  Intra-op Plan:   Post-operative Plan: Post-operative intubation/ventilation  Informed Consent: I have reviewed the patients History and Physical, chart, labs and discussed the procedure including the risks, benefits and alternatives for the proposed anesthesia with the patient or authorized representative who has indicated his/her understanding and acceptance.   Dental advisory given  Plan Discussed  with: CRNA  Anesthesia Plan Comments:         Anesthesia Quick Evaluation

## 2017-08-05 NOTE — H&P (Signed)
[address]       [place] [address]             [phone]      Cardiothoracic Surgery Admission History and Physical   PCP is Sinda Du, MD  Referring Provider is Troy Sine, MD      Chief Complaint  Patient presents with  . Coronary Artery Disease    New Pt Evaluation, Redo CABG, Cath 07/11/2017   HPI:  The patient is a 77 year old gentleman with hypertension, hyperlipidemia, atrial fibrillation and supraventricular tachycardia, chronic diastolic heart failure, and coronary disease who I had seen initially in June 2013 for a 5.1 cm ascending aortic aneurysm. We decided to continue following this with a repeat scan in 6 months. The patient had had prior history of coronary artery disease treated with stents and was followed by Dr. Verl Blalock. He subsequently underwent coronary bypass graft surgery and replacement of his ascending aorta at [facility] by Dr. Anne Shutter in 2013. He had a left internal mammary graft to the LAD, saphenous vein graft to the ramus, saphenous vein graft to the obtuse marginal, and a saphenous vein graft to the posterior lateral branch. He said that he did well for about 2-1/2 years and then began having some episodic chest discomfort similar to his prior chest pain. Over the past couple months he has had increasing episodes of chest discomfort as well as some exertional fatigue. He was evaluated in October with chest pain and palpitations and has been followed by Dr. Curt Bears. He presently describes episodes of squeezing substernal chest discomfort with exertion like walking upstairs. He has had no associated shortness of breath. The symptom symptoms are similar to what he had prior to his bypass surgery. His last echocardiogram on 03/06/2017 showed an ejection fraction of 60-65% with grade 2 diastolic dysfunction. There was mild aortic insufficiency and aortic root was mildly dilated at 4.5 cm. There is moderate concentric left ventricular  hypertrophy and severe focal basal septal hypertrophy. He underwent cardiac catheterization by Dr. Claiborne Billings on 07/11/2017. This showed a left ventricular ejection fraction of 50-55%. There was severe native three-vessel coronary disease with an 80% proximal calcified LAD stenosis in the region of the first diagonal branch with a 90% ostial first diagonal stenosis. The LAD has 60-70% diffuse stenosis after the second diagonal vessel with competitive filling beyond that from the left internal mammary graft. There was total ostial occlusion of left circumflex coronary artery at the prior stenting site. There were collaterals to 2 moderate-sized branches of the left circumflex. There is a large dominant right coronary artery with diffuse calcification. There is a 95% calcific rock-like stenosis in the region just beyond a large posterior descending branch that compromise to large posterior lateral branches. All the previous saphenous vein grafts were occluded at their origin. There is a patent left internal mammary graft to the mid LAD. LVEDP was 22.      Past Medical History:  Diagnosis Date  . Ascending aortic aneurysm [facility])    Status post repair 12/2011 at [facility]  . Atrial fibrillation (HCC)    Perioperative  . BPH (benign prostatic hyperplasia)   . Chronic diastolic CHF (congestive heart failure) ([place])   . Chronic shortness of breath   . Coronary atherosclerosis of native coronary artery    a. Multivessel status post prior stenting and ultimately CABG 12/2011 at [facility] with with LIMA to LAD, SVG to PLB, SVG to ramus, SVG to OM .  b. Last cath 06/2014 (following ischemic nuc) -> medical therapy, no feasible way to revascularize LCx territory.  . Essential hypertension, benign   . Mixed hyperlipidemia   . Myocardial infarction ([place])   . Supraventricular tachycardia [facility])         Past Surgical History:  Procedure Laterality Date  . ASCENDING AORTIC ANEURYSM REPAIR  12/2011 [facility]   26 mm.Dacron graft  .  CORONARY ARTERY BYPASS GRAFT  12/2011 [facility]   LIMA to LAD, SVG to PLB, SVG to ramus, SVG to OM  . HERNIA REPAIR    . LEFT HEART CATH AND CORS/GRAFTS ANGIOGRAPHY N/A 07/11/2017   Procedure: LEFT HEART CATH AND CORS/GRAFTS ANGIOGRAPHY; Surgeon: Troy Sine, MD; Location: [place] CV LAB; Service: Cardiovascular; Laterality: N/A;  . LEFT HEART CATHETERIZATION WITH CORONARY ANGIOGRAM N/A 06/18/2014   Procedure: LEFT HEART CATHETERIZATION WITH CORONARY ANGIOGRAM; Surgeon: Blane Ohara, MD; Location: [facility] CATH LAB; Service: Cardiovascular; Laterality: N/A;  . TOTAL HIP ARTHROPLASTY Right 02/07/2016   Procedure: RIGHT TOTAL HIP ARTHROPLASTY ANTERIOR APPROACH; Surgeon: Mcarthur Rossetti, MD; Location: [place]; Service: Orthopedics; Laterality: Right;  . TRANSURETHRAL RESECTION OF PROSTATE          Family History  Problem Relation Age of Onset  . Hypertension Father   . Parkinson's disease Father   . Hypertension Mother   . Lung cancer Sister    Social History  Social History        Tobacco Use  . Smoking status: Never Smoker  . Smokeless tobacco: Never Used  . Tobacco comment: tobacco use - no  Substance Use Topics  . Alcohol use: Yes    Alcohol/week: 0.0 oz    Comment: Occasional  . Drug use: No         Current Outpatient Medications  Medication Sig Dispense Refill  . aspirin EC 81 MG tablet Take 81 mg by mouth daily.    [name] atorvastatin (LIPITOR) 40 MG tablet TAKE 1 TABLET BY MOUTH AT BEDTIME (Patient taking differently: TAKE 40 MG BY MOUTH AT BEDTIME) 90 tablet 6  . CARTIA XT 240 MG 24 hr capsule TAKE ONE CAPSULE BY MOUTH EVERY DAY (Patient taking differently: TAKE 240 MG BY MOUTH EVERY DAY) 30 capsule 6  . furosemide (LASIX) 20 MG tablet TAKE 1 TABLET BY MOUTH AS NEEDED FOR LEG SWELLING (Patient taking differently: TAKE 20 MG BY MOUTH AS DAILY NEEDED FOR LEG SWELLING) 90 tablet 3  . Multiple Vitamin (MULTIVITAMIN) tablet Take 1 tablet by mouth at bedtime.     . naproxen  (NAPROSYN) 500 MG tablet Take 500 mg by mouth 2 (two) times daily as needed for moderate pain.     . nitroGLYCERIN (NITROSTAT) 0.4 MG SL tablet Place 1 tablet (0.4 mg total) under the tongue every 5 (five) minutes as needed for chest pain. 25 tablet 3  . tadalafil (CIALIS) 5 MG tablet Take 5 mg by mouth as needed for erectile dysfunction.    . Tamsulosin HCl (FLOMAX) 0.4 MG CAPS Take 0.4 mg by mouth daily.     . valACYclovir (VALTREX) 500 MG tablet Take 500 mg by mouth daily as needed (for outbreaks).   4  . metoprolol succinate (TOPROL-XL) 100 MG 24 hr tablet Take 1 tablet (100 mg total) by mouth daily. Take with or immediately following a meal. 90 tablet 1   No current facility-administered medications for this visit.    No Known Allergies  Review of Systems  Constitutional: Positive for activity change and fatigue.  HENT: Negative.  Eyes: Negative.  Respiratory: Positive for chest tightness. Negative for shortness of breath.  Cardiovascular: Positive for chest pain, palpitations and leg swelling.  Gastrointestinal: Negative.  Endocrine: Negative.  Genitourinary: Negative.  Musculoskeletal: Negative.  Allergic/Immunologic: Negative.  Neurological: Negative.  Hematological: Negative.  Psychiatric/Behavioral: Negative.   BP (!) 143/79 (BP Location: Right Arm, Patient Position: Sitting, Cuff Size: Normal)  Pulse (!) 55  Resp 18  Ht 5\' 7"  (1.702 m)  Wt 197 lb 6.4 oz (89.5 kg)  SpO2 98% Comment: RA  BMI 30.92 kg/m  Physical Exam  Constitutional: He is oriented to person, place, and time. He appears well-developed and well-nourished. No distress.  HENT:  Head: Normocephalic and atraumatic.  Mouth/Throat: Oropharynx is clear and moist.  Eyes: Conjunctivae and EOM are normal. Pupils are equal, round, and reactive to light.  Neck: Normal range of motion. Neck supple. No JVD present. No thyromegaly present.  Cardiovascular: Normal rate, regular rhythm, normal heart sounds and intact  distal pulses.  No murmur heard.  Pulmonary/Chest: Effort normal and breath sounds normal. No respiratory distress.  Prior sternotomy scar  Abdominal: Soft. Bowel sounds are normal. He exhibits no distension and no mass. There is no tenderness.  Musculoskeletal: Normal range of motion. He exhibits edema.  Lymphadenopathy:  He has no cervical adenopathy.  Neurological: He is alert and oriented to person, place, and time. He has normal strength. No cranial nerve deficit or sensory deficit.  Skin: Skin is warm and dry.  Psychiatric: He has a normal mood and affect.   Diagnostic Tests:  Result status: Final result  *[facility] - [place], [place] [address]  [phone]  -------------------------------------------------------------------  Transthoracic Echocardiography  Patient: Sanad, Fearnow  MR #: [id number]  Study Date: 03/06/2017  Gender: M  Age: 5  Height: 170.2 cm  Weight: 85.7 kg  BSA: 2.04 m^2  Pt. Status:  Room: [place] [id number]  ZYSAYTKZS WFUXNAT, FTDDUK [id number]  Mickel Baas  ATTENDING Ezequiel Essex [id number]  PERFORMING [facility], Forestine Na  SONOGRAPHER Alvino Chapel, RCS  cc:  -------------------------------------------------------------------  LV EF: 60% - 65%  -------------------------------------------------------------------  Indications: Chest pain 786.51.  -------------------------------------------------------------------  History: PMH: Acquired from the patient and from the patient&'s  chart. Coronary artery disease. Risk factors: Mixed  hyperlipidemia. Hypertension.  -------------------------------------------------------------------  Study Conclusions  - Left ventricle: The cavity size was normal. There was moderate  concentric and severe focal basal septal hypertrophy. Systolic  function was normal. The estimated ejection fraction was in the  range of 60% to 65%. Wall motion was normal;  there were no  regional wall motion abnormalities. Features are consistent with  a pseudonormal left ventricular filling pattern, with concomitant  abnormal relaxation and increased filling pressure (grade 2  diastolic dysfunction). Doppler parameters are consistent with  high ventricular filling pressure.  - Aortic valve: There was mild regurgitation.  - Aortic root: The aortic root was mildly dilated (4.48 cm).  - Left atrium: The atrium was severely dilated.  -------------------------------------------------------------------  Labs, prior tests, procedures, and surgery:  Coronary artery bypass grafting.  -------------------------------------------------------------------  Study data: Comparison was made to the study of 05/27/2014. Study  status: Routine. Procedure: The patient reported no pain pre or  post test. Transthoracic echocardiography. Image quality was  adequate. Study completion: There were no complications.  Transthoracic echocardiography. M-mode, complete 2D, spectral  Doppler, and color Doppler. Birthdate: Patient birthdate:  06/15/1940. Age: Patient is 77  yr old. Sex: Gender: male.  BMI: 29.6 kg/m^2. Blood pressure: 140/72 Patient status:  Inpatient. Study date: Study date: 03/06/2017. Study time: 08:23  AM. Location: Bedside.  -------------------------------------------------------------------  -------------------------------------------------------------------  Left ventricle: The cavity size was normal. There was moderate  concentric and severe focal basal septal hypertrophy. Systolic  function was normal. The estimated ejection fraction was in the  range of 60% to 65%. Wall motion was normal; there were no regional  wall motion abnormalities. Features are consistent with a  pseudonormal left ventricular filling pattern, with concomitant  abnormal relaxation and increased filling pressure (grade 2  diastolic dysfunction). Doppler parameters are consistent with high   ventricular filling pressure.  -------------------------------------------------------------------  Aortic valve: Trileaflet. Doppler: There was no stenosis.  There was mild regurgitation.  -------------------------------------------------------------------  Aorta: Aortic root: The aortic root was mildly dilated (4.48 cm).  -------------------------------------------------------------------  Mitral valve: Structurally normal valve. Leaflet separation was  normal. Doppler: Transvalvular velocity was within the normal  range. There was no evidence for stenosis. There was no  regurgitation. Peak gradient (D): 4 mm Hg.  -------------------------------------------------------------------  Left atrium: The atrium was severely dilated.  -------------------------------------------------------------------  Atrial septum: No defect or patent foramen ovale was identified.  -------------------------------------------------------------------  Right ventricle: The cavity size was normal. Wall thickness was  normal. Systolic function was normal.  -------------------------------------------------------------------  Pulmonic valve: The valve appears to be grossly normal.  Doppler: There was no significant regurgitation.  -------------------------------------------------------------------  Tricuspid valve: Structurally normal valve. Leaflet separation  was normal. Doppler: Transvalvular velocity was within the normal  range. There was no significant regurgitation.  -------------------------------------------------------------------  Right atrium: The atrium was normal in size.  -------------------------------------------------------------------  Pericardium: There was no pericardial effusion.  -------------------------------------------------------------------  Systemic veins:  Inferior vena cava: The vessel was normal in size. The  respirophasic diameter changes were in the normal range (>= 50%),    consistent with normal central venous pressure.  -------------------------------------------------------------------  Measurements  Left ventricle Value Reference  LV ID, ED, PLAX chordal 47.7 mm 43 - 52  LV ID, ES, PLAX chordal 31 mm 23 - 38  LV fx shortening, PLAX chordal 35 % >=29  LV PW thickness, ED 13.5 mm ----------  IVS/LV PW ratio, ED 1.21 <=1.3  Stroke volume, 2D 77 ml ----------  Stroke volume/bsa, 2D 38 ml/m^2 ----------  LV ejection fraction, 1-p A4C 55 % ----------  LV end-diastolic volume, 2-p 270 ml ----------  LV end-systolic volume, 2-p 38 ml ----------  LV ejection fraction, 2-p 62 % ----------  Stroke volume, 2-p 62 ml ----------  LV end-diastolic volume/bsa, 2-p 49 ml/m^2 ----------  LV end-systolic volume/bsa, 2-p 19 ml/m^2 ----------  Stroke volume/bsa, 2-p 30.3 ml/m^2 ----------  LV e&', lateral 7.72 cm/s ----------  LV E/e&', lateral 12.25 ----------  LV e&', medial 4.79 cm/s ----------  LV E/e&', medial 19.75 ----------  LV e&', average 6.26 cm/s ----------  LV E/e&', average 15.12 ----------  Ventricular septum Value Reference  IVS thickness, ED 16.3 mm ----------  LVOT Value Reference  LVOT ID, S 21 mm ----------  LVOT area 3.46 cm^2 ----------  LVOT peak velocity, S 95.3 cm/s ----------  LVOT mean velocity, S 62.1 cm/s ----------  LVOT VTI, S 22.2 cm ----------  LVOT peak gradient, S 4 mm Hg ----------  Left atrium Value Reference  LA ID, A-P, ES 42 mm ----------  LA ID/bsa, A-P 2.06 cm/m^2 <=2.2  LA volume, S 102 ml ----------  LA volume/bsa, S 50.1 ml/m^2 ----------  LA volume, ES,  1-p A4C 125 ml ----------  LA volume/bsa, ES, 1-p A4C 61.4 ml/m^2 ----------  LA volume, ES, 1-p A2C 78.2 ml ----------  LA volume/bsa, ES, 1-p A2C 38.4 ml/m^2 ----------  Mitral valve Value Reference  Mitral E-wave peak velocity 94.6 cm/s ----------  Mitral A-wave peak velocity 75.7 cm/s ----------  Mitral deceleration time (H) 239 ms 150 - 230  Mitral peak  gradient, D 4 mm Hg ----------  Mitral E/A ratio, peak 1.2 ----------  Right atrium Value Reference  RA ID, S-I, ES, A4C (H) 60.2 mm 34 - 49  RA area, ES, A4C 17.8 cm^2 8.3 - 19.5  RA volume, ES, A/L 42.5 ml ----------  RA volume/bsa, ES, A/L 20.9 ml/m^2 ----------  Systemic veins Value Reference  Estimated CVP 3 mm Hg ----------  Right ventricle Value Reference  RV ID, ED, PLAX 24.1 mm 19 - 38  TAPSE 14.7 mm ----------  RV s&', lateral, S 11.9 cm/s ----------  Legend:  (L) and (H) mark values outside specified reference range.  -------------------------------------------------------------------  Prepared and Electronically Authenticated by  Kate Sable, MD  2018-10-24T10:33:01  Physicians  Panel Physicians Referring Physician Case Authorizing Physician  Troy Sine, MD (Primary)    Procedures  LEFT HEART CATH AND CORS/GRAFTS ANGIOGRAPHY  Conclusion  Prox RCA to Mid RCA lesion is 30% stenosed.  Mid RCA lesion is 20% stenosed.  Post Atrio lesion is 95% stenosed.  Ost Cx to Prox Cx lesion is 100% stenosed.  Ost 1st Diag lesion is 90% stenosed.  Prox LAD-1 lesion is 80% stenosed.  Prox LAD-2 lesion is 70% stenosed.  The left ventricular ejection fraction is 50-55% by visual estimate.  The left ventricular systolic function is normal.  LV end diastolic pressure is mildly elevated. Normal global LV function with ejection fraction at 50-55%.  Severe native CAD with 80% proximal calcified LAD stenosis in the region of the first diagonal vessel with 90% ostial first diagonal stenosis and 60-70% diffuse LAD stenosis after the second diagonal vessel with competitive filling beyond this from the LIMA graft; total ostial occlusion of the left circumflex coronary artery at site of prior stenting. There is collateralization to 2 branches of the circumflex via the left injection. Large, very dominant RCA with diffuse calcification with 30% narrowing in the mid segment, a patent stent  proximal to the acute margin with 20% intimal aplasia, and apparent significant calcific rocklike segment in the region just beyond the very large PDA takeoff and prior to to large inferior LV and posterior lateral branches.  Occlusion of the previous 3 saphenous vein conduits at its origin in the aorta.  Patent LIMA graft supplying the mid LAD.  RECOMMENDATION:  Angiographic findings were reviewed with Dr. Burt Knack. It appears that the 90% very calcified "rock "in the dista RCA jeopardizes a very large distal RCA system. This may not be amenable to atherectomy and and would be very difficult to stent which would also potentially jail these large branch vessels. With his significant concomitant disease with total occlusion of the circumflex and collateralized large branches that are good surgical targets and in addition to his proximal LAD and diagonal vessel stenosis outpatient surgical consultation will be initially obtained for consideration of redo CABG revascularization. Isosorbide will be added to his medical regimen.   Indications  Angina pectoris ([place]) [I20.9 (ICD-10-CM)]  Coronary artery disease involving coronary bypass graft of native heart with other forms of angina pectoris ([place]) [N56.213 (ICD-10-CM)]  Procedural Details/Technique  Technical Details Mr. Evan Mackie  is a 77 year old male who underwent CABG revascularization surgery [place] with a LIMA to the LAD, SVG to PLB, SVG to ramus, SVG to OM in conjunction with ascending aortic aneurysm repair in 2013. In 2016. He underwent cardiac catheterization by Dr. Burt Knack and all vein grafts were occluded. He had a patent RCA stent and patent LIMA graft. There was the beginnings of calcification to the distal RCA beyond the PDA. His native circumflex was occluded. Recently, the patient has noticed increasing exertional chest tightness, particularly with step climbing. He was seen by Dr. Curt Bears and was scheduled for repeat cardiac  catheterization.  The patient arrived to the catheterization laboratory in the fasting state. His right femoral artery was punctured anteriorly and a 5 French sheath was inserted without difficulty. Diagnostic catheterization was done with 5 [place] Judkins for left and right coronary catheters. A LIMA catheter was used for selective angiography in the internal mammary artery. It was difficult to visualize each individual vein graft. Previous aortography at his last cath had shown all these grafts to be occluded. A 5 French pigtail catheter was used for left ventriculography. Angiographic findings were reviewed with Dr. Burt Knack. Hemostasis was obtained by direct manual pressure. The patient tolerated the procedure well, returned to his room in stable condition.   Estimated blood loss <50 mL.  During this procedure the patient was administered the following to achieve and maintain moderate conscious sedation: Versed 2 mg, Fentanyl 25 mcg, while the patient's heart rate, blood pressure, and oxygen saturation were continuously monitored. The period of conscious sedation was 49 minutes, of which I was present face-to-face 100% of this time.  Coronary Findings  Diagnostic  Dominance: Right  Left Anterior Descending  Prox LAD-1 lesion 80% stenosed  Prox LAD-1 lesion is 80% stenosed.  Prox LAD-2 lesion 70% stenosed  Prox LAD-2 lesion is 70% stenosed.  First Diagonal Branch  Ost 1st Diag lesion 90% stenosed  Ost 1st Diag lesion is 90% stenosed.  Left Circumflex  Ost Cx to Prox Cx lesion 100% stenosed  Ost Cx to Prox Cx lesion is 100% stenosed. The lesion was previously treated.  Right Coronary Artery  Prox RCA to Mid RCA lesion 30% stenosed  Prox RCA to Mid RCA lesion is 30% stenosed.  Mid RCA lesion 20% stenosed  Mid RCA lesion is 20% stenosed. The lesion was previously treated.  Right Posterior Atrioventricular Branch  Post Atrio lesion 95% stenosed  Post Atrio lesion is 95% stenosed.  LIMA  Graft to Mid LAD  Intervention  No interventions have been documented.  Left Heart  Left Ventricle The left ventricular size is normal. The left ventricular systolic function is normal. LV end diastolic pressure is mildly elevated. The left ventricular ejection fraction is 50-55% by visual estimate. No regional wall motion abnormalities. LVEDP 22 mmHg.  Coronary Diagrams  Diagnostic Diagram     Implants     No implant documentation for this case.  MERGE Images  Link to Procedure Log   Show images for CARDIAC CATHETERIZATION Procedure Log  Hemo Data   Most Recent Value  AO Systolic Pressure 301 mmHg  AO Diastolic Pressure 62 mmHg  AO Mean 88 mmHg  LV Systolic Pressure 601 mmHg  LV Diastolic Pressure 5 mmHg  LV EDP 22 mmHg  Arterial Occlusion Pressure Extended Systolic Pressure 093 mmHg  Arterial Occlusion Pressure Extended Diastolic Pressure 66 mmHg  Arterial Occlusion Pressure Extended Mean Pressure 97 mmHg  Left Ventricular Apex Extended Systolic Pressure 235  mmHg  Left Ventricular Apex Extended Diastolic Pressure 10 mmHg  Left Ventricular Apex Extended EDP Pressure 22 mmHg   CT ANGIOGRAPHY CHEST WITH CONTRAST  TECHNIQUE: Multidetector CT imaging of the chest was performed using the standard protocol during bolus administration of intravenous contrast. Multiplanar CT image reconstructions and MIPs were obtained to evaluate the vascular anatomy.  CONTRAST: 68mL ISOVUE-370 IOPAMIDOL (ISOVUE-370) INJECTION 76%  COMPARISON: CTA chest dated April 14, 2010.  FINDINGS: Cardiovascular: Prior ascending thoracic aortic aneurysm repair. Unchanged aneurysmal dilatation of the ascending thoracic aorta distal to the prior aneurysm repair, measuring up to 4.4 cm. Normal heart size. No pericardial effusion. Coronary, aortic arch, and branch vessel atherosclerotic vascular disease. No central pulmonary embolism.  Mediastinum/Nodes: No enlarged mediastinal, hilar, or axillary  lymph nodes. Bilateral thyroid nodules are unchanged. The trachea and esophagus demonstrate no significant findings.  Lungs/Pleura: Minimal bibasilar atelectasis. Scarring in the lingula. No focal consolidation, pleural effusion, or pneumothorax. No suspicious pulmonary nodule.  Upper Abdomen: Small hiatal hernia. No acute abnormality.  Musculoskeletal: Interval increase in size of a now 10 mm sclerotic lesion in the left T5 pedicle, previously 3 mm. Prior healed median sternotomy. No chest wall abnormality. No acute or significant osseous findings.  Review of the MIP images confirms the above findings.  IMPRESSION: 1. Prior ascending thoracic aortic aneurysm repair. Unchanged aneurysmal dilatation of the ascending thoracic aorta distal to the aneurysm repair, measuring up to 4.4 cm. Recommend annual imaging followup by CTA or MRA. This recommendation follows 2010 ACCF/AHA/AATS/ACR/ASA/SCA/SCAI/SIR/STS/SVM Guidelines for the Diagnosis and Management of Patients with Thoracic Aortic Disease. Circulation. 2010; 121: [id number] 2. Interval increase in size of a now 10 mm sclerotic lesion in the left T5 pedicle, previously 3 mm. This is nonspecific and may represent a bone island given the slow growth over past 8 years. Consider bone scan to evaluate for abnormal metabolic activity or other lesions. 3. Aortic atherosclerosis (ICD10-I70.0).   Electronically Signed By: Titus Dubin M.D. On: 07/24/2017 12:13    Impression:   This 77 year old gentleman has severe multivessel coronary disease with occluded saphenous vein grafts and a patent left internal mammary graft to the LAD status post coronary bypass graft surgery in 2013. He has 2 moderate sized obtuse marginal branches and a large dominant distal right coronary artery that are suitable for grafting. He has progressive anginal symptoms of angina and a large dominant right coronary artery that is at risk due to a  calcified rock-like lesion in the distal right coronary artery just beyond the posterior descending branch. Cardiology does not feel that PCI is a good option and therefore I think a redo coronary bypass graft surgery is indicated. His vein mapping shows that the left greater saphenous vein is present and patent and appears to be of adequate size to use as a bypass conduit.  The right greater saphenous vein was previously harvested.  The upper extremity peripheral arterial Dopplers show a normal palmar arch on the left with no reduction in flow with radial compression so I think the left radial artery could be used.  The palmar arch signal on the right side decreased with radial compression and therefore the right radial artery is not available.  His chest CTA shows the mid to distal ascending aorta has a maximum diameter of 4.4 cm beyond the aneurysm repair. I discussed the operative procedure with the patient and family including alternatives, benefits and risks; including but not limited to bleeding, blood transfusion, infection, stroke, myocardial infarction, graft failure,  heart block requiring a permanent pacemaker, organ dysfunction, and death.  Margaretha Glassing understands and agrees to proceed.     Plan:  Redo sternotomy for AVR, left radial artery harvest, possible right axillary artery cannulation.

## 2017-08-06 ENCOUNTER — Encounter (HOSPITAL_COMMUNITY): Payer: Self-pay | Admitting: *Deleted

## 2017-08-06 ENCOUNTER — Inpatient Hospital Stay (HOSPITAL_COMMUNITY): Payer: Medicare Other | Admitting: Anesthesiology

## 2017-08-06 ENCOUNTER — Inpatient Hospital Stay (HOSPITAL_COMMUNITY): Payer: Medicare Other

## 2017-08-06 ENCOUNTER — Ambulatory Visit (HOSPITAL_COMMUNITY): Payer: Medicare Other

## 2017-08-06 ENCOUNTER — Encounter (HOSPITAL_COMMUNITY)
Admission: RE | Disposition: A | Discharge status: Home / Self Care | Payer: Self-pay | Source: Ambulatory Visit | Attending: Surgery

## 2017-08-06 ENCOUNTER — Inpatient Hospital Stay (HOSPITAL_COMMUNITY)
Admission: RE | Admit: 2017-08-06 | Discharge: 2017-08-11 | DRG: 236 | Disposition: A | Discharge status: Home / Self Care | Payer: Medicare Other | Source: Ambulatory Visit | Attending: Surgery | Admitting: Surgery

## 2017-08-06 DIAGNOSIS — D62 Acute posthemorrhagic anemia: Secondary | ICD-10-CM | POA: Diagnosis not present

## 2017-08-06 DIAGNOSIS — Z9079 Acquired absence of other genital organ(s): Secondary | ICD-10-CM

## 2017-08-06 DIAGNOSIS — I4891 Unspecified atrial fibrillation: Secondary | ICD-10-CM | POA: Diagnosis present

## 2017-08-06 DIAGNOSIS — Z01810 Encounter for preprocedural cardiovascular examination: Secondary | ICD-10-CM | POA: Diagnosis not present

## 2017-08-06 DIAGNOSIS — I25810 Atherosclerosis of coronary artery bypass graft(s) without angina pectoris: Principal | ICD-10-CM | POA: Diagnosis present

## 2017-08-06 DIAGNOSIS — I2510 Atherosclerotic heart disease of native coronary artery without angina pectoris: Secondary | ICD-10-CM

## 2017-08-06 DIAGNOSIS — N400 Enlarged prostate without lower urinary tract symptoms: Secondary | ICD-10-CM | POA: Diagnosis present

## 2017-08-06 DIAGNOSIS — Z01818 Encounter for other preprocedural examination: Secondary | ICD-10-CM | POA: Diagnosis not present

## 2017-08-06 DIAGNOSIS — Z01812 Encounter for preprocedural laboratory examination: Secondary | ICD-10-CM | POA: Diagnosis not present

## 2017-08-06 DIAGNOSIS — Z79899 Other long term (current) drug therapy: Secondary | ICD-10-CM

## 2017-08-06 DIAGNOSIS — J9811 Atelectasis: Secondary | ICD-10-CM | POA: Diagnosis not present

## 2017-08-06 DIAGNOSIS — Z955 Presence of coronary angioplasty implant and graft: Secondary | ICD-10-CM

## 2017-08-06 DIAGNOSIS — I5032 Chronic diastolic (congestive) heart failure: Secondary | ICD-10-CM | POA: Diagnosis present

## 2017-08-06 DIAGNOSIS — I700 Atherosclerosis of aorta: Secondary | ICD-10-CM | POA: Diagnosis present

## 2017-08-06 DIAGNOSIS — E1165 Type 2 diabetes mellitus with hyperglycemia: Secondary | ICD-10-CM | POA: Diagnosis not present

## 2017-08-06 DIAGNOSIS — R319 Hematuria, unspecified: Secondary | ICD-10-CM | POA: Diagnosis not present

## 2017-08-06 DIAGNOSIS — J939 Pneumothorax, unspecified: Secondary | ICD-10-CM | POA: Diagnosis not present

## 2017-08-06 DIAGNOSIS — Z951 Presence of aortocoronary bypass graft: Secondary | ICD-10-CM

## 2017-08-06 DIAGNOSIS — I2584 Coronary atherosclerosis due to calcified coronary lesion: Secondary | ICD-10-CM | POA: Diagnosis present

## 2017-08-06 DIAGNOSIS — Z8679 Personal history of other diseases of the circulatory system: Secondary | ICD-10-CM | POA: Diagnosis not present

## 2017-08-06 DIAGNOSIS — I252 Old myocardial infarction: Secondary | ICD-10-CM | POA: Diagnosis not present

## 2017-08-06 DIAGNOSIS — I10 Essential (primary) hypertension: Secondary | ICD-10-CM | POA: Diagnosis not present

## 2017-08-06 DIAGNOSIS — I110 Hypertensive heart disease with heart failure: Secondary | ICD-10-CM | POA: Diagnosis present

## 2017-08-06 DIAGNOSIS — E782 Mixed hyperlipidemia: Secondary | ICD-10-CM | POA: Diagnosis present

## 2017-08-06 DIAGNOSIS — J90 Pleural effusion, not elsewhere classified: Secondary | ICD-10-CM | POA: Diagnosis not present

## 2017-08-06 DIAGNOSIS — Z96641 Presence of right artificial hip joint: Secondary | ICD-10-CM | POA: Diagnosis present

## 2017-08-06 DIAGNOSIS — I080 Rheumatic disorders of both mitral and aortic valves: Secondary | ICD-10-CM | POA: Diagnosis not present

## 2017-08-06 DIAGNOSIS — I371 Nonrheumatic pulmonary valve insufficiency: Secondary | ICD-10-CM | POA: Diagnosis not present

## 2017-08-06 DIAGNOSIS — J95811 Postprocedural pneumothorax: Secondary | ICD-10-CM | POA: Diagnosis not present

## 2017-08-06 DIAGNOSIS — M1991 Primary osteoarthritis, unspecified site: Secondary | ICD-10-CM | POA: Diagnosis present

## 2017-08-06 DIAGNOSIS — I25110 Atherosclerotic heart disease of native coronary artery with unstable angina pectoris: Secondary | ICD-10-CM

## 2017-08-06 DIAGNOSIS — I25118 Atherosclerotic heart disease of native coronary artery with other forms of angina pectoris: Secondary | ICD-10-CM | POA: Diagnosis not present

## 2017-08-06 HISTORY — PX: TEE WITHOUT CARDIOVERSION: SHX5443

## 2017-08-06 HISTORY — PX: CORONARY ARTERY BYPASS GRAFT: SHX141

## 2017-08-06 HISTORY — PX: RADIAL ARTERY HARVEST: SHX5067

## 2017-08-06 LAB — POCT I-STAT, CHEM 8
BUN: 14 mg/dL (ref 6–20)
BUN: 14 mg/dL (ref 6–20)
BUN: 13 mg/dL (ref 6–20)
BUN: 14 mg/dL (ref 6–20)
BUN: 13 mg/dL (ref 6–20)
BUN: 12 mg/dL (ref 6–20)
BUN: 13 mg/dL (ref 6–20)
BUN: 14 mg/dL (ref 6–20)
BUN: 11 mg/dL (ref 6–20)
CALCIUM ION: 1.18 mmol/L (ref 1.15–1.40)
CALCIUM ION: 0.98 mmol/L — AB (ref 1.15–1.40)
CHLORIDE: 104 mmol/L (ref 101–111)
CHLORIDE: 106 mmol/L (ref 101–111)
CHLORIDE: 103 mmol/L (ref 101–111)
CHLORIDE: 106 mmol/L (ref 101–111)
CHLORIDE: 104 mmol/L (ref 101–111)
CREATININE: 0.7 mg/dL (ref 0.61–1.24)
CREATININE: 0.7 mg/dL (ref 0.61–1.24)
CREATININE: 0.6 mg/dL — AB (ref 0.61–1.24)
CREATININE: 0.7 mg/dL (ref 0.61–1.24)
Calcium, Ion: 1.19 mmol/L (ref 1.15–1.40)
Calcium, Ion: 1.07 mmol/L — ABNORMAL LOW (ref 1.15–1.40)
Calcium, Ion: 1.07 mmol/L — ABNORMAL LOW (ref 1.15–1.40)
Calcium, Ion: 1.05 mmol/L — ABNORMAL LOW (ref 1.15–1.40)
Calcium, Ion: 0.99 mmol/L — ABNORMAL LOW (ref 1.15–1.40)
Calcium, Ion: 0.99 mmol/L — ABNORMAL LOW (ref 1.15–1.40)
Calcium, Ion: 1.02 mmol/L — ABNORMAL LOW (ref 1.15–1.40)
Chloride: 105 mmol/L (ref 101–111)
Chloride: 105 mmol/L (ref 101–111)
Chloride: 107 mmol/L (ref 101–111)
Chloride: 105 mmol/L (ref 101–111)
Creatinine, Ser: 0.7 mg/dL (ref 0.61–1.24)
Creatinine, Ser: 0.8 mg/dL (ref 0.61–1.24)
Creatinine, Ser: 0.7 mg/dL (ref 0.61–1.24)
Creatinine, Ser: 0.6 mg/dL — ABNORMAL LOW (ref 0.61–1.24)
Creatinine, Ser: 0.6 mg/dL — ABNORMAL LOW (ref 0.61–1.24)
GLUCOSE: 143 mg/dL — AB (ref 65–99)
GLUCOSE: 150 mg/dL — AB (ref 65–99)
GLUCOSE: 133 mg/dL — AB (ref 65–99)
GLUCOSE: 142 mg/dL — AB (ref 65–99)
Glucose, Bld: 106 mg/dL — ABNORMAL HIGH (ref 65–99)
Glucose, Bld: 92 mg/dL (ref 65–99)
Glucose, Bld: 90 mg/dL (ref 65–99)
Glucose, Bld: 116 mg/dL — ABNORMAL HIGH (ref 65–99)
Glucose, Bld: 150 mg/dL — ABNORMAL HIGH (ref 65–99)
HCT: 33 % — ABNORMAL LOW (ref 39.0–52.0)
HCT: 28 % — ABNORMAL LOW (ref 39.0–52.0)
HCT: 30 % — ABNORMAL LOW (ref 39.0–52.0)
HCT: 29 % — ABNORMAL LOW (ref 39.0–52.0)
HCT: 31 % — ABNORMAL LOW (ref 39.0–52.0)
HEMATOCRIT: 37 % — AB (ref 39.0–52.0)
HEMATOCRIT: 34 % — AB (ref 39.0–52.0)
HEMATOCRIT: 30 % — AB (ref 39.0–52.0)
HEMATOCRIT: 32 % — AB (ref 39.0–52.0)
HEMOGLOBIN: 10.2 g/dL — AB (ref 13.0–17.0)
HEMOGLOBIN: 9.9 g/dL — AB (ref 13.0–17.0)
Hemoglobin: 12.6 g/dL — ABNORMAL LOW (ref 13.0–17.0)
Hemoglobin: 11.6 g/dL — ABNORMAL LOW (ref 13.0–17.0)
Hemoglobin: 10.9 g/dL — ABNORMAL LOW (ref 13.0–17.0)
Hemoglobin: 11.2 g/dL — ABNORMAL LOW (ref 13.0–17.0)
Hemoglobin: 10.2 g/dL — ABNORMAL LOW (ref 13.0–17.0)
Hemoglobin: 9.5 g/dL — ABNORMAL LOW (ref 13.0–17.0)
Hemoglobin: 10.5 g/dL — ABNORMAL LOW (ref 13.0–17.0)
POTASSIUM: 3.8 mmol/L (ref 3.5–5.1)
POTASSIUM: 4.1 mmol/L (ref 3.5–5.1)
POTASSIUM: 3.9 mmol/L (ref 3.5–5.1)
POTASSIUM: 4.5 mmol/L (ref 3.5–5.1)
POTASSIUM: 4.5 mmol/L (ref 3.5–5.1)
POTASSIUM: 4.2 mmol/L (ref 3.5–5.1)
POTASSIUM: 4.2 mmol/L (ref 3.5–5.1)
Potassium: 4.6 mmol/L (ref 3.5–5.1)
Potassium: 4.6 mmol/L (ref 3.5–5.1)
SODIUM: 142 mmol/L (ref 135–145)
SODIUM: 143 mmol/L (ref 135–145)
Sodium: 141 mmol/L (ref 135–145)
Sodium: 141 mmol/L (ref 135–145)
Sodium: 141 mmol/L (ref 135–145)
Sodium: 140 mmol/L (ref 135–145)
Sodium: 141 mmol/L (ref 135–145)
Sodium: 144 mmol/L (ref 135–145)
Sodium: 141 mmol/L (ref 135–145)
TCO2: 27 mmol/L (ref 22–32)
TCO2: 26 mmol/L (ref 22–32)
TCO2: 27 mmol/L (ref 22–32)
TCO2: 28 mmol/L (ref 22–32)
TCO2: 27 mmol/L (ref 22–32)
TCO2: 26 mmol/L (ref 22–32)
TCO2: 26 mmol/L (ref 22–32)
TCO2: 24 mmol/L (ref 22–32)
TCO2: 22 mmol/L (ref 22–32)

## 2017-08-06 LAB — POCT I-STAT 3, ART BLOOD GAS (G3+)
ACID-BASE DEFICIT: 2 mmol/L (ref 0.0–2.0)
ACID-BASE EXCESS: 1 mmol/L (ref 0.0–2.0)
Acid-base deficit: 2 mmol/L (ref 0.0–2.0)
BICARBONATE: 26.5 mmol/L (ref 20.0–28.0)
BICARBONATE: 25.1 mmol/L (ref 20.0–28.0)
BICARBONATE: 22.5 mmol/L (ref 20.0–28.0)
O2 SAT: 95 %
O2 Saturation: 100 %
O2 Saturation: 96 %
PH ART: 7.361 (ref 7.350–7.450)
PH ART: 7.421 (ref 7.350–7.450)
PO2 ART: 303 mmHg — AB (ref 83.0–108.0)
PO2 ART: 75 mmHg — AB (ref 83.0–108.0)
Patient temperature: 35.7
Patient temperature: 37.1
TCO2: 28 mmol/L (ref 22–32)
TCO2: 27 mmol/L (ref 22–32)
TCO2: 24 mmol/L (ref 22–32)
pCO2 arterial: 46.7 mmHg (ref 32.0–48.0)
pCO2 arterial: 47.3 mmHg (ref 32.0–48.0)
pCO2 arterial: 34.7 mmHg (ref 32.0–48.0)
pH, Arterial: 7.327 — ABNORMAL LOW (ref 7.350–7.450)
pO2, Arterial: 78 mmHg — ABNORMAL LOW (ref 83.0–108.0)

## 2017-08-06 LAB — CBC
HCT: 32.7 % — ABNORMAL LOW (ref 39.0–52.0)
HCT: 33.2 % — ABNORMAL LOW (ref 39.0–52.0)
HEMOGLOBIN: 10.7 g/dL — AB (ref 13.0–17.0)
Hemoglobin: 10.7 g/dL — ABNORMAL LOW (ref 13.0–17.0)
MCH: 29.5 pg (ref 26.0–34.0)
MCH: 28.5 pg (ref 26.0–34.0)
MCHC: 32.7 g/dL (ref 30.0–36.0)
MCHC: 32.2 g/dL (ref 30.0–36.0)
MCV: 90.1 fL (ref 78.0–100.0)
MCV: 88.3 fL (ref 78.0–100.0)
PLATELETS: 134 10*3/uL — AB (ref 150–400)
PLATELETS: 148 10*3/uL — AB (ref 150–400)
RBC: 3.63 MIL/uL — AB (ref 4.22–5.81)
RBC: 3.76 MIL/uL — ABNORMAL LOW (ref 4.22–5.81)
RDW: 15.6 % — ABNORMAL HIGH (ref 11.5–15.5)
RDW: 15.3 % (ref 11.5–15.5)
WBC: 15.3 10*3/uL — AB (ref 4.0–10.5)
WBC: 17.5 10*3/uL — ABNORMAL HIGH (ref 4.0–10.5)

## 2017-08-06 LAB — POCT I-STAT 4, (NA,K, GLUC, HGB,HCT)
Glucose, Bld: 110 mg/dL — ABNORMAL HIGH (ref 65–99)
HCT: 31 % — ABNORMAL LOW (ref 39.0–52.0)
Hemoglobin: 10.5 g/dL — ABNORMAL LOW (ref 13.0–17.0)
POTASSIUM: 4.2 mmol/L (ref 3.5–5.1)
SODIUM: 146 mmol/L — AB (ref 135–145)

## 2017-08-06 LAB — FIBRINOGEN: FIBRINOGEN: 187 mg/dL — AB (ref 210–475)

## 2017-08-06 LAB — PLATELET COUNT: Platelets: 129 10*3/uL — ABNORMAL LOW (ref 150–400)

## 2017-08-06 LAB — CREATININE, SERUM
Creatinine, Ser: 0.76 mg/dL (ref 0.61–1.24)
GFR calc non Af Amer: >60 mL/min (ref >60)

## 2017-08-06 LAB — PROTIME-INR
INR: 1.41
PROTHROMBIN TIME: 17.2 s — AB (ref 11.4–15.2)

## 2017-08-06 LAB — APTT: APTT: 32 s (ref 24–36)

## 2017-08-06 LAB — HEMOGLOBIN AND HEMATOCRIT, BLOOD
HCT: 29.7 % — ABNORMAL LOW (ref 39.0–52.0)
HEMOGLOBIN: 9.9 g/dL — AB (ref 13.0–17.0)

## 2017-08-06 LAB — MAGNESIUM: Magnesium: 3.2 mg/dL — ABNORMAL HIGH (ref 1.7–2.4)

## 2017-08-06 LAB — GLUCOSE, CAPILLARY
Glucose-Capillary: 114 mg/dL — ABNORMAL HIGH (ref 65–99)
Glucose-Capillary: 131 mg/dL — ABNORMAL HIGH (ref 65–99)

## 2017-08-06 LAB — PREPARE RBC (CROSSMATCH)

## 2017-08-06 SURGERY — REDO CORONARY ARTERY BYPASS GRAFTING (CABG)
Anesthesia: General | Site: Arm Lower

## 2017-08-06 MED ORDER — CHLORHEXIDINE GLUCONATE 4 % EX LIQD: 30.0000 mL | CUTANEOUS | Status: DC

## 2017-08-06 MED ORDER — SODIUM CHLORIDE 0.9 % IV SOLN
INTRAVENOUS | Status: DC | PRN
  Administered 2017-08-06: 1 [IU]/h via INTRAVENOUS

## 2017-08-06 MED ORDER — OXYCODONE HCL 5 MG PO TABS
5.0000 mg | ORAL_TABLET | ORAL | Status: DC | PRN
  Administered 2017-08-07 – 2017-08-08 (×2): 5 mg via ORAL
  Filled 2017-08-06 (×2): qty 1

## 2017-08-06 MED ORDER — LACTATED RINGERS IV SOLN
INTRAVENOUS | Status: DC | PRN
  Administered 2017-08-06: 07:00:00 via INTRAVENOUS

## 2017-08-06 MED ORDER — VANCOMYCIN HCL IN DEXTROSE 1-5 GM/200ML-% IV SOLN
1000.0000 mg | Freq: Once | INTRAVENOUS | Status: AC
  Administered 2017-08-07: 1000 mg via INTRAVENOUS
  Filled 2017-08-06: qty 200

## 2017-08-06 MED ORDER — NITROGLYCERIN IN D5W 200-5 MCG/ML-% IV SOLN: 0.0000 ug/min | INTRAVENOUS | Status: DC

## 2017-08-06 MED ORDER — LACTATED RINGERS IV SOLN: 500.0000 mL | Freq: Once | INTRAVENOUS | Status: DC | PRN

## 2017-08-06 MED ORDER — FAMOTIDINE IN NACL 20-0.9 MG/50ML-% IV SOLN
20.0000 mg | Freq: Two times a day (BID) | INTRAVENOUS | Status: AC
  Administered 2017-08-06 – 2017-08-07 (×2): 20 mg via INTRAVENOUS
  Filled 2017-08-06 (×2): qty 50

## 2017-08-06 MED ORDER — MIDAZOLAM HCL 2 MG/2ML IJ SOLN: 2.0000 mg | INTRAMUSCULAR | Status: DC | PRN

## 2017-08-06 MED ORDER — THROMBIN (RECOMBINANT) 20000 UNITS EX SOLR
CUTANEOUS | Status: DC | PRN
  Administered 2017-08-06: 20000 [IU] via TOPICAL

## 2017-08-06 MED ORDER — BISACODYL 5 MG PO TBEC
10.0000 mg | DELAYED_RELEASE_TABLET | Freq: Every day | ORAL | Status: DC
  Administered 2017-08-07 – 2017-08-09 (×3): 10 mg via ORAL
  Filled 2017-08-06 (×3): qty 2

## 2017-08-06 MED ORDER — SODIUM CHLORIDE 0.9 % IV SOLN: Freq: Once | INTRAVENOUS | Status: DC

## 2017-08-06 MED ORDER — CEFAZOLIN SODIUM-DEXTROSE 2-4 GM/100ML-% IV SOLN
2.0000 g | Freq: Three times a day (TID) | INTRAVENOUS | Status: AC
  Administered 2017-08-06 – 2017-08-08 (×6): 2 g via INTRAVENOUS
  Filled 2017-08-06 (×6): qty 100

## 2017-08-06 MED ORDER — HEPARIN SODIUM (PORCINE) 1000 UNIT/ML IJ SOLN
INTRAMUSCULAR | Status: DC | PRN
  Administered 2017-08-06: 28000 [IU] via INTRAVENOUS

## 2017-08-06 MED ORDER — THROMBIN (RECOMBINANT) 20000 UNITS EX SOLR
OROMUCOSAL | Status: DC | PRN
  Administered 2017-08-06 (×3): 4 mL via TOPICAL

## 2017-08-06 MED ORDER — SODIUM CHLORIDE 0.45 % IV SOLN: INTRAVENOUS | Status: DC | PRN

## 2017-08-06 MED ORDER — PROPOFOL 10 MG/ML IV BOLUS
INTRAVENOUS | Status: DC | PRN
  Administered 2017-08-06: 50 mg via INTRAVENOUS

## 2017-08-06 MED ORDER — ROCURONIUM BROMIDE 10 MG/ML (PF) SYRINGE
PREFILLED_SYRINGE | INTRAVENOUS | Status: AC
  Filled 2017-08-06: qty 5

## 2017-08-06 MED ORDER — DEXAMETHASONE SODIUM PHOSPHATE 10 MG/ML IJ SOLN
INTRAMUSCULAR | Status: AC
  Filled 2017-08-06: qty 1

## 2017-08-06 MED ORDER — CHLORHEXIDINE GLUCONATE 0.12% ORAL RINSE (MEDLINE KIT)
15.0000 mL | Freq: Two times a day (BID) | OROMUCOSAL | Status: DC
  Administered 2017-08-06: 15 mL via OROMUCOSAL

## 2017-08-06 MED ORDER — VANCOMYCIN HCL 1000 MG IV SOLR
INTRAVENOUS | Status: DC | PRN
  Administered 2017-08-06: 1500 mg via INTRAVENOUS

## 2017-08-06 MED ORDER — ACETAMINOPHEN 500 MG PO TABS
1000.0000 mg | ORAL_TABLET | Freq: Four times a day (QID) | ORAL | Status: DC
  Administered 2017-08-07 – 2017-08-09 (×10): 1000 mg via ORAL
  Filled 2017-08-06 (×10): qty 2

## 2017-08-06 MED ORDER — SODIUM CHLORIDE 0.9 % IV SOLN: INTRAVENOUS | Status: DC

## 2017-08-06 MED ORDER — TAMSULOSIN HCL 0.4 MG PO CAPS
0.4000 mg | ORAL_CAPSULE | Freq: Every day | ORAL | Status: DC
  Administered 2017-08-07 – 2017-08-11 (×5): 0.4 mg via ORAL
  Filled 2017-08-06 (×5): qty 1

## 2017-08-06 MED ORDER — DEXMEDETOMIDINE HCL IN NACL 200 MCG/50ML IV SOLN
INTRAVENOUS | Status: DC | PRN
  Administered 2017-08-06: .7 ug/kg/h via INTRAVENOUS

## 2017-08-06 MED ORDER — ACETAMINOPHEN 160 MG/5ML PO SOLN: 1000.0000 mg | Freq: Four times a day (QID) | ORAL | Status: DC

## 2017-08-06 MED ORDER — LACTATED RINGERS IV SOLN: INTRAVENOUS | Status: DC

## 2017-08-06 MED ORDER — MIDAZOLAM HCL 10 MG/2ML IJ SOLN
INTRAMUSCULAR | Status: AC
  Filled 2017-08-06: qty 2

## 2017-08-06 MED ORDER — ATORVASTATIN CALCIUM 40 MG PO TABS
40.0000 mg | ORAL_TABLET | Freq: Every day | ORAL | Status: DC
  Administered 2017-08-07 – 2017-08-10 (×4): 40 mg via ORAL
  Filled 2017-08-06 (×4): qty 1

## 2017-08-06 MED ORDER — ACETAMINOPHEN 160 MG/5ML PO SOLN: 650.0000 mg | Freq: Once | ORAL | Status: AC

## 2017-08-06 MED ORDER — PROPOFOL 10 MG/ML IV BOLUS
INTRAVENOUS | Status: AC
  Filled 2017-08-06: qty 20

## 2017-08-06 MED ORDER — SODIUM CHLORIDE 0.9 % IJ SOLN
INTRAMUSCULAR | Status: AC
  Filled 2017-08-06: qty 20

## 2017-08-06 MED ORDER — METOPROLOL TARTRATE 12.5 MG HALF TABLET: 12.5000 mg | ORAL_TABLET | Freq: Two times a day (BID) | ORAL | Status: DC

## 2017-08-06 MED ORDER — ONDANSETRON HCL 4 MG/2ML IJ SOLN: 4.0000 mg | Freq: Four times a day (QID) | INTRAMUSCULAR | Status: DC | PRN

## 2017-08-06 MED ORDER — ORAL CARE MOUTH RINSE
15.0000 mL | Freq: Four times a day (QID) | OROMUCOSAL | Status: DC
  Administered 2017-08-07 (×2): 15 mL via OROMUCOSAL

## 2017-08-06 MED ORDER — SODIUM CHLORIDE 0.9 % IV SOLN: 250.0000 mL | INTRAVENOUS | Status: DC

## 2017-08-06 MED ORDER — DEXMEDETOMIDINE HCL IN NACL 200 MCG/50ML IV SOLN
0.0000 ug/kg/h | INTRAVENOUS | Status: DC
  Administered 2017-08-06: 0.7 ug/kg/h via INTRAVENOUS
  Filled 2017-08-06: qty 50

## 2017-08-06 MED ORDER — INSULIN REGULAR BOLUS VIA INFUSION
0.0000 [IU] | Freq: Three times a day (TID) | INTRAVENOUS | Status: DC
  Filled 2017-08-06: qty 10

## 2017-08-06 MED ORDER — SODIUM CHLORIDE 0.9% FLUSH
3.0000 mL | Freq: Two times a day (BID) | INTRAVENOUS | Status: DC
  Administered 2017-08-07 – 2017-08-09 (×5): 3 mL via INTRAVENOUS

## 2017-08-06 MED ORDER — HEMOSTATIC AGENTS (NO CHARGE) OPTIME
TOPICAL | Status: DC | PRN
  Administered 2017-08-06 (×2): 1 via TOPICAL

## 2017-08-06 MED ORDER — CHLORHEXIDINE GLUCONATE 0.12 % MT SOLN: 15.0000 mL | OROMUCOSAL | Status: AC

## 2017-08-06 MED ORDER — METOPROLOL TARTRATE 12.5 MG HALF TABLET: 12.5000 mg | ORAL_TABLET | Freq: Once | ORAL | Status: DC

## 2017-08-06 MED ORDER — MAGNESIUM SULFATE 4 GM/100ML IV SOLN
4.0000 g | Freq: Once | INTRAVENOUS | Status: AC
  Administered 2017-08-06: 4 g via INTRAVENOUS
  Filled 2017-08-06: qty 100

## 2017-08-06 MED ORDER — TRAMADOL HCL 50 MG PO TABS
50.0000 mg | ORAL_TABLET | ORAL | Status: DC | PRN
  Administered 2017-08-07: 100 mg via ORAL
  Filled 2017-08-06: qty 2

## 2017-08-06 MED ORDER — FENTANYL CITRATE (PF) 250 MCG/5ML IJ SOLN
INTRAMUSCULAR | Status: DC | PRN
  Administered 2017-08-06: 50 ug via INTRAVENOUS
  Administered 2017-08-06: 150 ug via INTRAVENOUS
  Administered 2017-08-06: 100 ug via INTRAVENOUS
  Administered 2017-08-06: 150 ug via INTRAVENOUS
  Administered 2017-08-06: 100 ug via INTRAVENOUS
  Administered 2017-08-06: 450 ug via INTRAVENOUS
  Administered 2017-08-06: 50 ug via INTRAVENOUS
  Administered 2017-08-06 (×2): 100 ug via INTRAVENOUS
  Administered 2017-08-06: 200 ug via INTRAVENOUS

## 2017-08-06 MED ORDER — METOPROLOL TARTRATE 5 MG/5ML IV SOLN: 2.5000 mg | INTRAVENOUS | Status: DC | PRN

## 2017-08-06 MED ORDER — PROTAMINE SULFATE 10 MG/ML IV SOLN
INTRAVENOUS | Status: DC | PRN
  Administered 2017-08-06: 20 mg via INTRAVENOUS
  Administered 2017-08-06: 280 mg via INTRAVENOUS

## 2017-08-06 MED ORDER — FENTANYL CITRATE (PF) 250 MCG/5ML IJ SOLN
INTRAMUSCULAR | Status: AC
  Filled 2017-08-06: qty 5

## 2017-08-06 MED ORDER — SODIUM CHLORIDE 0.9% FLUSH: 3.0000 mL | INTRAVENOUS | Status: DC | PRN

## 2017-08-06 MED ORDER — PANTOPRAZOLE SODIUM 40 MG PO TBEC
40.0000 mg | DELAYED_RELEASE_TABLET | Freq: Every day | ORAL | Status: DC
  Administered 2017-08-08 – 2017-08-09 (×2): 40 mg via ORAL
  Filled 2017-08-06 (×2): qty 1

## 2017-08-06 MED ORDER — ALBUMIN HUMAN 5 % IV SOLN
250.0000 mL | INTRAVENOUS | Status: AC | PRN
  Administered 2017-08-06: 250 mL via INTRAVENOUS

## 2017-08-06 MED ORDER — ACETAMINOPHEN 650 MG RE SUPP
650.0000 mg | Freq: Once | RECTAL | Status: AC
  Administered 2017-08-06: 650 mg via RECTAL

## 2017-08-06 MED ORDER — PHENYLEPHRINE HCL 10 MG/ML IJ SOLN
INTRAVENOUS | Status: DC | PRN
  Administered 2017-08-06: 25 ug/min via INTRAVENOUS

## 2017-08-06 MED ORDER — METOPROLOL TARTRATE 25 MG/10 ML ORAL SUSPENSION
12.5000 mg | Freq: Two times a day (BID) | ORAL | Status: DC
  Administered 2017-08-06: 12.5 mg
  Filled 2017-08-06: qty 5

## 2017-08-06 MED ORDER — BISACODYL 10 MG RE SUPP: 10.0000 mg | Freq: Every day | RECTAL | Status: DC

## 2017-08-06 MED ORDER — POTASSIUM CHLORIDE 10 MEQ/50ML IV SOLN: 10.0000 meq | INTRAVENOUS | Status: AC

## 2017-08-06 MED ORDER — SODIUM CHLORIDE 0.9 % IV SOLN
30.0000 ug/min | INTRAVENOUS | Status: DC
  Filled 2017-08-06: qty 2

## 2017-08-06 MED ORDER — DOCUSATE SODIUM 100 MG PO CAPS
200.0000 mg | ORAL_CAPSULE | Freq: Every day | ORAL | Status: DC
  Administered 2017-08-07 – 2017-08-09 (×3): 200 mg via ORAL
  Filled 2017-08-06 (×3): qty 2

## 2017-08-06 MED ORDER — ASPIRIN 81 MG PO CHEW: 324.0000 mg | CHEWABLE_TABLET | Freq: Every day | ORAL | Status: DC

## 2017-08-06 MED ORDER — PLASMA-LYTE 148 IV SOLN
INTRAVENOUS | Status: DC | PRN
  Administered 2017-08-06: 500 mL

## 2017-08-06 MED ORDER — SODIUM CHLORIDE 0.9 % IV SOLN
0.0000 ug/min | INTRAVENOUS | Status: DC
  Filled 2017-08-06: qty 2

## 2017-08-06 MED ORDER — 0.9 % SODIUM CHLORIDE (POUR BTL) OPTIME
TOPICAL | Status: DC | PRN
  Administered 2017-08-06: 5000 mL

## 2017-08-06 MED ORDER — MORPHINE SULFATE (PF) 2 MG/ML IV SOLN: 1.0000 mg | INTRAVENOUS | Status: AC | PRN

## 2017-08-06 MED ORDER — FENTANYL CITRATE (PF) 250 MCG/5ML IJ SOLN
INTRAMUSCULAR | Status: AC
  Filled 2017-08-06: qty 25

## 2017-08-06 MED ORDER — ASPIRIN EC 325 MG PO TBEC
325.0000 mg | DELAYED_RELEASE_TABLET | Freq: Every day | ORAL | Status: DC
  Administered 2017-08-07 – 2017-08-09 (×3): 325 mg via ORAL
  Filled 2017-08-06 (×3): qty 1

## 2017-08-06 MED ORDER — CHLORHEXIDINE GLUCONATE 0.12 % MT SOLN
15.0000 mL | Freq: Once | OROMUCOSAL | Status: AC
  Administered 2017-08-06: 15 mL via OROMUCOSAL
  Filled 2017-08-06: qty 15

## 2017-08-06 MED ORDER — EPHEDRINE SULFATE 50 MG/ML IJ SOLN
INTRAMUSCULAR | Status: DC | PRN
  Administered 2017-08-06 (×2): 2.5 mg via INTRAVENOUS

## 2017-08-06 MED ORDER — ARTIFICIAL TEARS OPHTHALMIC OINT
TOPICAL_OINTMENT | OPHTHALMIC | Status: DC | PRN
  Administered 2017-08-06: 1 via OPHTHALMIC

## 2017-08-06 MED ORDER — THROMBIN 20000 UNITS EX SOLR
CUTANEOUS | Status: AC
  Filled 2017-08-06: qty 20000

## 2017-08-06 MED ORDER — ONDANSETRON HCL 4 MG/2ML IJ SOLN
INTRAMUSCULAR | Status: AC
  Filled 2017-08-06: qty 2

## 2017-08-06 MED ORDER — TRANEXAMIC ACID 1000 MG/10ML IV SOLN
INTRAVENOUS | Status: DC | PRN
  Administered 2017-08-06: 15:00:00 via INTRAVENOUS
  Administered 2017-08-06: 1.5 mg/kg/h via INTRAVENOUS

## 2017-08-06 MED ORDER — MORPHINE SULFATE (PF) 2 MG/ML IV SOLN
2.0000 mg | INTRAVENOUS | Status: DC | PRN
  Administered 2017-08-07 (×2): 2 mg via INTRAVENOUS
  Filled 2017-08-06 (×2): qty 1

## 2017-08-06 MED ORDER — LACTATED RINGERS IV SOLN
INTRAVENOUS | Status: DC | PRN
  Administered 2017-08-06 (×2): via INTRAVENOUS

## 2017-08-06 MED ORDER — ROCURONIUM BROMIDE 10 MG/ML (PF) SYRINGE
PREFILLED_SYRINGE | INTRAVENOUS | Status: DC | PRN
  Administered 2017-08-06: 70 mg via INTRAVENOUS
  Administered 2017-08-06 (×2): 50 mg via INTRAVENOUS
  Administered 2017-08-06: 30 mg via INTRAVENOUS
  Administered 2017-08-06: 50 mg via INTRAVENOUS
  Administered 2017-08-06: 30 mg via INTRAVENOUS
  Administered 2017-08-06: 50 mg via INTRAVENOUS

## 2017-08-06 MED ORDER — MIDAZOLAM HCL 5 MG/5ML IJ SOLN
INTRAMUSCULAR | Status: DC | PRN
  Administered 2017-08-06: 3 mg via INTRAVENOUS
  Administered 2017-08-06 (×3): 1 mg via INTRAVENOUS

## 2017-08-06 SURGICAL SUPPLY — 130 items
ADAPTER CARDIO PERF ANTE/RETRO (ADAPTER) ×1 IMPLANT
ADH SKN CLS APL DERMABOND .7 (GAUZE/BANDAGES/DRESSINGS) ×2
ADPR PRFSN 84XANTGRD RTRGD (ADAPTER) ×2
APL SRG 7X2 LUM MLBL SLNT (VASCULAR PRODUCTS) ×6
APPLICATOR TIP COSEAL (VASCULAR PRODUCTS) ×3 IMPLANT
BAG DECANTER FOR FLEXI CONT (MISCELLANEOUS) ×3 IMPLANT
BAG TRANSFER SAHARA   T 3161 (MISCELLANEOUS) ×1 IMPLANT
BANDAGE ACE 4X5 VEL STRL LF (GAUZE/BANDAGES/DRESSINGS) ×3 IMPLANT
BANDAGE ACE 6X5 VEL STRL LF (GAUZE/BANDAGES/DRESSINGS) ×3 IMPLANT
BLADE CLIPPER SURG (BLADE) ×1 IMPLANT
BLADE CORE FAN STRYKER (BLADE) ×1 IMPLANT
BLADE SURG 10 STRL SS (BLADE) ×1 IMPLANT
BLADE SURG 11 STRL SS (BLADE) ×2 IMPLANT
BLADE SURG 15 STRL LF DISP TIS (BLADE) ×2 IMPLANT
BLADE SURG 15 STRL SS (BLADE) ×6
BLOWER MISTER CAL-MED (MISCELLANEOUS) ×1 IMPLANT
BNDG GAUZE ELAST 4 BULKY (GAUZE/BANDAGES/DRESSINGS) ×3 IMPLANT
CANISTER SUCT 3000ML PPV (MISCELLANEOUS) ×3 IMPLANT
CANNULA GUNDRY RCSP 15FR (MISCELLANEOUS) IMPLANT
CATH FOLEY 3WAY  5CC 18FR (CATHETERS)
CATH FOLEY 3WAY 5CC 18FR (CATHETERS) IMPLANT
CATH ROBINSON RED A/P 18FR (CATHETERS) ×6 IMPLANT
CATH THORACIC 28FR (CATHETERS) ×3 IMPLANT
CATH THORACIC 36FR (CATHETERS) ×3 IMPLANT
CATH THORACIC 36FR RT ANG (CATHETERS) ×3 IMPLANT
CLIP FOGARTY SPRING 6M (CLIP) ×1 IMPLANT
CLIP VESOCCLUDE MED 24/CT (CLIP) IMPLANT
CLIP VESOCCLUDE SM WIDE 24/CT (CLIP) IMPLANT
CONN ST 1/4X3/8  BEN (MISCELLANEOUS) ×1
CONN ST 1/4X3/8 BEN (MISCELLANEOUS) IMPLANT
COVER MAYO STAND STRL (DRAPES) ×3 IMPLANT
CRADLE DONUT ADULT HEAD (MISCELLANEOUS) ×3 IMPLANT
DERMABOND ADVANCED (GAUZE/BANDAGES/DRESSINGS) ×1
DERMABOND ADVANCED .7 DNX12 (GAUZE/BANDAGES/DRESSINGS) IMPLANT
DRAPE CARDIOVASCULAR INCISE (DRAPES) ×3
DRAPE HALF SHEET 40X57 (DRAPES) ×6 IMPLANT
DRAPE SLUSH/WARMER DISC (DRAPES) ×1 IMPLANT
DRAPE SRG 135X102X78XABS (DRAPES) ×2 IMPLANT
DRSG AQUACEL AG ADV 3.5X14 (GAUZE/BANDAGES/DRESSINGS) ×1 IMPLANT
DRSG COVADERM 4X14 (GAUZE/BANDAGES/DRESSINGS) ×3 IMPLANT
ELECT BLADE 4.0 EZ CLEAN MEGAD (MISCELLANEOUS) ×3
ELECT CAUTERY BLADE 6.4 (BLADE) ×4 IMPLANT
ELECT REM PT RETURN 9FT ADLT (ELECTROSURGICAL) ×6
ELECTRODE BLDE 4.0 EZ CLN MEGD (MISCELLANEOUS) IMPLANT
ELECTRODE REM PT RTRN 9FT ADLT (ELECTROSURGICAL) ×4 IMPLANT
FELT TEFLON 1X6 (MISCELLANEOUS) ×3 IMPLANT
GAUZE SPONGE 4X4 12PLY STRL (GAUZE/BANDAGES/DRESSINGS) ×6 IMPLANT
GEL ULTRASOUND 20GR AQUASONIC (MISCELLANEOUS) ×3 IMPLANT
GLOVE BIO SURGEON STRL SZ 6 (GLOVE) IMPLANT
GLOVE BIO SURGEON STRL SZ 6.5 (GLOVE) ×4 IMPLANT
GLOVE BIO SURGEON STRL SZ7 (GLOVE) ×4 IMPLANT
GLOVE BIO SURGEON STRL SZ7.5 (GLOVE) IMPLANT
GLOVE BIOGEL PI IND STRL 6 (GLOVE) IMPLANT
GLOVE BIOGEL PI IND STRL 6.5 (GLOVE) IMPLANT
GLOVE BIOGEL PI IND STRL 7.0 (GLOVE) IMPLANT
GLOVE BIOGEL PI IND STRL 8 (GLOVE) IMPLANT
GLOVE BIOGEL PI INDICATOR 6 (GLOVE) ×4
GLOVE BIOGEL PI INDICATOR 6.5 (GLOVE)
GLOVE BIOGEL PI INDICATOR 7.0 (GLOVE)
GLOVE BIOGEL PI INDICATOR 8 (GLOVE) ×3
GLOVE ECLIPSE 8.0 STRL XLNG CF (GLOVE) ×3 IMPLANT
GLOVE EUDERMIC 7 POWDERFREE (GLOVE) ×7 IMPLANT
GLOVE ORTHO TXT STRL SZ7.5 (GLOVE) IMPLANT
GOWN STRL REUS W/ TWL LRG LVL3 (GOWN DISPOSABLE) ×8 IMPLANT
GOWN STRL REUS W/ TWL XL LVL3 (GOWN DISPOSABLE) ×2 IMPLANT
GOWN STRL REUS W/TWL LRG LVL3 (GOWN DISPOSABLE) ×24
GOWN STRL REUS W/TWL XL LVL3 (GOWN DISPOSABLE) ×9
GRAFT CV 30X8WVN NDL (Graft) IMPLANT
GRAFT HEMASHIELD 8MM (Graft) ×3 IMPLANT
HARMONIC SHEARS 14CM COAG (MISCELLANEOUS) ×3 IMPLANT
HEMOSTAT POWDER SURGIFOAM 1G (HEMOSTASIS) ×9 IMPLANT
HEMOSTAT SURGICEL 2X14 (HEMOSTASIS) ×3 IMPLANT
INSERT FOGARTY 61MM (MISCELLANEOUS) IMPLANT
INSERT FOGARTY XLG (MISCELLANEOUS) ×1 IMPLANT
KIT BASIN OR (CUSTOM PROCEDURE TRAY) ×3 IMPLANT
KIT CATH CPB BARTLE (MISCELLANEOUS) ×3 IMPLANT
KIT ROOM TURNOVER OR (KITS) ×3 IMPLANT
KIT SUCTION CATH 14FR (SUCTIONS) ×3 IMPLANT
KIT VASOVIEW HEMOPRO VH 3000 (KITS) ×3 IMPLANT
LOOP VESSEL MAXI BLUE (MISCELLANEOUS) ×1 IMPLANT
NEEDLE 22X1 1/2 (OR ONLY) (NEEDLE) ×1 IMPLANT
NS IRRIG 1000ML POUR BTL (IV SOLUTION) ×15 IMPLANT
PACK E OPEN HEART (SUTURE) ×3 IMPLANT
PACK OPEN HEART (CUSTOM PROCEDURE TRAY) ×3 IMPLANT
PAD ARMBOARD 7.5X6 YLW CONV (MISCELLANEOUS) ×8 IMPLANT
PAD DEFIB R2 (MISCELLANEOUS) ×3 IMPLANT
PAD ELECT DEFIB RADIOL ZOLL (MISCELLANEOUS) ×3 IMPLANT
PENCIL BUTTON HOLSTER BLD 10FT (ELECTRODE) ×3 IMPLANT
PUNCH AORTIC ROTATE 4.0MM (MISCELLANEOUS) IMPLANT
PUNCH AORTIC ROTATE 4.5MM 8IN (MISCELLANEOUS) ×3 IMPLANT
PUNCH AORTIC ROTATE 5MM 8IN (MISCELLANEOUS) IMPLANT
SEALANT SURG COSEAL 8ML (VASCULAR PRODUCTS) ×1 IMPLANT
SET CARDIOPLEGIA MPS 5001102 (MISCELLANEOUS) ×1 IMPLANT
SHEARS HARMONIC 9CM CVD (BLADE) ×3 IMPLANT
SHUNT FLO COIL 1.50MM (MISCELLANEOUS) ×1 IMPLANT
SOLUTION ANTI FOG 6CC (MISCELLANEOUS) ×1 IMPLANT
SPONGE INTESTINAL PEANUT (DISPOSABLE) IMPLANT
SPONGE LAP 18X18 RF (DISPOSABLE) ×1 IMPLANT
SPONGE LAP 18X18 X RAY DECT (DISPOSABLE) ×3 IMPLANT
SPONGE LAP 4X18 X RAY DECT (DISPOSABLE) ×5 IMPLANT
SUT BONE WAX W31G (SUTURE) ×3 IMPLANT
SUT MNCRL AB 4-0 PS2 18 (SUTURE) ×1 IMPLANT
SUT PROLENE 3 0 SH DA (SUTURE) ×1 IMPLANT
SUT PROLENE 3 0 SH1 36 (SUTURE) ×4 IMPLANT
SUT PROLENE 4 0 RB 1 (SUTURE) ×6
SUT PROLENE 4-0 RB1 .5 CRCL 36 (SUTURE) IMPLANT
SUT PROLENE 5 0 C 1 36 (SUTURE) ×2 IMPLANT
SUT PROLENE 6 0 C 1 30 (SUTURE) ×1 IMPLANT
SUT PROLENE 7 0 BV1 MDA (SUTURE) ×6 IMPLANT
SUT PROLENE BLUE 7 0 (SUTURE) IMPLANT
SUT SILK  1 MH (SUTURE) ×2
SUT SILK 1 MH (SUTURE) IMPLANT
SUT VIC AB 1 CTX 36 (SUTURE) ×6
SUT VIC AB 1 CTX36XBRD ANBCTR (SUTURE) ×4 IMPLANT
SUT VIC AB 2-0 CT1 27 (SUTURE) ×6
SUT VIC AB 2-0 CT1 TAPERPNT 27 (SUTURE) IMPLANT
SUT VIC AB 2-0 CTX 27 (SUTURE) ×1 IMPLANT
SUT VIC AB 3-0 SH 27 (SUTURE) ×3
SUT VIC AB 3-0 SH 27X BRD (SUTURE) IMPLANT
SUT VIC AB 3-0 X1 27 (SUTURE) ×5 IMPLANT
SYR 50ML SLIP (SYRINGE) ×3 IMPLANT
SYSTEM SAHARA CHEST DRAIN ATS (WOUND CARE) ×3 IMPLANT
TAPE CLOTH SURG 4X10 WHT LF (GAUZE/BANDAGES/DRESSINGS) ×1 IMPLANT
TOWEL GREEN STERILE (TOWEL DISPOSABLE) ×3 IMPLANT
TOWEL GREEN STERILE FF (TOWEL DISPOSABLE) ×3 IMPLANT
TRAY FOLEY SILVER 16FR TEMP (SET/KITS/TRAYS/PACK) ×3 IMPLANT
TUBING INSUFFLATION (TUBING) ×3 IMPLANT
UNDERPAD 30X30 (UNDERPADS AND DIAPERS) ×4 IMPLANT
WATER STERILE IRR 1000ML POUR (IV SOLUTION) ×6 IMPLANT
YANKAUER SUCT BULB TIP NO VENT (SUCTIONS) ×1 IMPLANT

## 2017-08-06 NOTE — OR Nursing (Signed)
Foley inserted at 0800.  Urine return red colored urine.  Notified Dr. Cyndia Bent.  Urine color cleared to yellow/straw in color within 10 minutes of catheter insertion.  Will continue to monitor.

## 2017-08-06 NOTE — Progress Notes (Signed)
      [address]       [place] [address]             [phone]      S/p redo CABG  Intubated, sedated  BP (!) 104/47   Pulse (!) 56   Temp 97.7 F (36.5 C) (Oral)   Resp 14   SpO2 95%  PA 29/20  Intake/Output Summary (Last 24 hours) at 08/06/2017 1918 Last data filed at 08/06/2017 1904 Gross per 24 hour  Intake 3790 ml  Output 4705 ml  Net -915 ml   Minimal CT output Swan repositioned into PA Has a small left pneumo on CXR. Reviewed with Dr. Cyndia Bent. He feels air entrained into that area during procedure, no reason to suspect pulmonary injury. Peak pressures are low. Follow clinically for now  [place]. Roxan Hockey, MD [facility] [phone]

## 2017-08-06 NOTE — Anesthesia Procedure Notes (Addendum)
Arterial Line Insertion Start/End3/26/2019 6:40 AM, 08/06/2017 6:45 AM Performed by: Roderic Palau, MD, Bryson Corona, CRNA, CRNA  Patient location: Pre-op. Preanesthetic checklist: patient identified, IV checked, site marked, risks and benefits discussed, surgical consent, monitors and equipment checked, pre-op evaluation, timeout performed and anesthesia consent Lidocaine 1% used for infiltration Right, radial was placed Catheter size: 20 G Hand hygiene performed , maximum sterile barriers used  and Seldinger technique used  Attempts: 1 Procedure performed without using ultrasound guided technique. Following insertion, dressing applied and Biopatch. Post procedure assessment: normal  Patient tolerated the procedure well with no immediate complications. Additional procedure comments: Aline placed by Jonetta Speak. [name]

## 2017-08-06 NOTE — Brief Op Note (Signed)
08/06/2017  9:14 AM  PATIENT:  Eric Beltran  77 y.o. male  PRE-OPERATIVE DIAGNOSIS:  CAD  POST-OPERATIVE DIAGNOSIS:  CAD  PROCEDURE:  Procedure(s): REDO CORONARY ARTERY BYPASS GRAFTING (CABG) TIMES FOUR UTILIZING LEFT GREATER SAPHENOUS VEIN HAVESTED ENDOVASCULARLY, LEFT RADIAL ARTERY.  RIGHT AXILLARY CANNULATION (N/A) RADIAL ARTERY HARVEST (Left) TRANSESOPHAGEAL ECHOCARDIOGRAM (TEE) (N/A)  Radial artery to Diag1 SVG to OM1 SVG to PDA SVG to PL2  SURGEON:  Surgeon(s) and Role:    * Bartle, Fernande Boyden, MD - Primary  PHYSICIAN ASSISTANT:  Nicholes Rough, PA-C   ANESTHESIA:   none  EBL:  1300 mL   BLOOD ADMINISTERED:none  DRAINS: routine   LOCAL MEDICATIONS USED:  NONE  SPECIMEN:  No Specimen  DISPOSITION OF SPECIMEN:  N/A  COUNTS:  YES  DICTATION: .Dragon Dictation  PLAN OF CARE: Admit to inpatient   PATIENT DISPOSITION:  ICU - intubated and hemodynamically stable.   Delay start of Pharmacological VTE agent (>24hrs) due to surgical blood loss or risk of bleeding: yes

## 2017-08-06 NOTE — Progress Notes (Signed)
RT notified of desire to start rapid wean

## 2017-08-06 NOTE — Anesthesia Postprocedure Evaluation (Signed)
Anesthesia Post Note  Patient: Eric Beltran  Procedure(s) Performed: REDO CORONARY ARTERY BYPASS GRAFTING (CABG) TIMES FOUR UTILIZING LEFT GREATER SAPHENOUS VEIN HAVESTED ENDOVASCULARLY, LEFT RADIAL ARTERY.  RIGHT AXILLARY CANNULATION (N/A ) RADIAL ARTERY HARVEST (Left Arm Lower) TRANSESOPHAGEAL ECHOCARDIOGRAM (TEE) (N/A )     Patient location during evaluation: SICU Anesthesia Type: General Level of consciousness: patient remains intubated per anesthesia plan Pain management: pain level controlled Vital Signs Assessment: post-procedure vital signs reviewed and stable Respiratory status: patient remains intubated per anesthesia plan Cardiovascular status: stable Anesthetic complications: no    Last Vitals:  Vitals:   08/06/17 2145 08/06/17 2200  BP:  99/73  Pulse: 89 89  Resp: 15 15  Temp: 36.7 C 36.8 C  SpO2: 98% 97%    Last Pain:  Vitals:   08/06/17 0608  TempSrc: Oral                 [name]

## 2017-08-06 NOTE — Progress Notes (Signed)
Started rapid wean at 2225 and just flipped patient back to full support for patient to rest and try rapid wean again in an hour. Patient is not taking enough adequate breaths without reminder. Will cont to monitor.

## 2017-08-06 NOTE — Anesthesia Procedure Notes (Signed)
Central Venous Catheter Insertion Performed by: Roderic Palau, MD, anesthesiologist Start/End3/26/2019 6:40 AM, 08/06/2017 6:55 AM Patient location: Pre-op. Preanesthetic checklist: patient identified, IV checked, site marked, risks and benefits discussed, surgical consent, monitors and equipment checked, pre-op evaluation, timeout performed and anesthesia consent Hand hygiene performed  and maximum sterile barriers used  PA cath was placed.Swan type:thermodilution PA Cath depth:50 Procedure performed without using ultrasound guided technique. Attempts: 1 Patient tolerated the procedure well with no immediate complications.

## 2017-08-06 NOTE — Anesthesia Procedure Notes (Signed)
Central Venous Catheter Insertion Performed by: Roderic Palau, MD, anesthesiologist Start/End3/26/2019 6:40 AM, 08/06/2017 6:55 AM Patient location: Pre-op. Preanesthetic checklist: patient identified, IV checked, site marked, risks and benefits discussed, surgical consent, monitors and equipment checked, pre-op evaluation, timeout performed and anesthesia consent Position: Trendelenburg Lidocaine 1% used for infiltration and patient sedated Hand hygiene performed , maximum sterile barriers used  and Seldinger technique used Catheter size: 9 Fr Total catheter length 10. Central line was placed.MAC introducer Procedure performed using ultrasound guided technique. Ultrasound Notes:anatomy identified, needle tip was noted to be adjacent to the nerve/plexus identified, no ultrasound evidence of intravascular and/or intraneural injection and image(s) printed for medical record Attempts: 1 Following insertion, line sutured, dressing applied and Biopatch. Post procedure assessment: blood return through all ports, free fluid flow and no air  Patient tolerated the procedure well with no immediate complications.

## 2017-08-06 NOTE — Interval H&P Note (Signed)
History and Physical Interval Note:  08/06/2017 6:43 AM  Eric Beltran  has presented today for surgery, with the diagnosis of CAD  The various methods of treatment have been discussed with the patient and family. After consideration of risks, benefits and other options for treatment, the patient has consented to  Procedure(s) with comments: REDO CORONARY ARTERY BYPASS GRAFTING (CABG) (N/A) - POSSIBLE RIGHT AXILLARY ARTERY CANNULATION RADIAL ARTERY HARVEST (Left) TRANSESOPHAGEAL ECHOCARDIOGRAM (TEE) (N/A) as a surgical intervention .  The patient's history has been reviewed, patient examined, no change in status, stable for surgery.  I have reviewed the patient's chart and labs.  Questions were answered to the patient's satisfaction.     Gaye Pollack

## 2017-08-06 NOTE — Anesthesia Procedure Notes (Addendum)
Procedure Name: Intubation Date/Time: 08/06/2017 7:46 AM Performed by: Bryson Corona, CRNA Pre-anesthesia Checklist: Patient identified, Emergency Drugs available, Suction available, Patient being monitored and Timeout performed Patient Re-evaluated:Patient Re-evaluated prior to induction Oxygen Delivery Method: Circle system utilized Preoxygenation: Pre-oxygenation with 100% oxygen Induction Type: IV induction Ventilation: Mask ventilation without difficulty Laryngoscope Size: Mac and 3 Grade View: Grade II Tube type: Oral Tube size: 8.0 mm Number of attempts: 1 Airway Equipment and Method: Stylet Placement Confirmation: ETT inserted through vocal cords under direct vision,  positive ETCO2,  CO2 detector and breath sounds checked- equal and bilateral Secured at: 23 cm Tube secured with: Tape Dental Injury: Teeth and Oropharynx as per pre-operative assessment  Comments: Intubation by Burnett Corrente SRNA

## 2017-08-06 NOTE — Transfer of Care (Signed)
Immediate Anesthesia Transfer of Care Note  Patient: Eric Beltran  Procedure(s) Performed: REDO CORONARY ARTERY BYPASS GRAFTING (CABG) TIMES FOUR UTILIZING LEFT GREATER SAPHENOUS VEIN HAVESTED ENDOVASCULARLY, LEFT RADIAL ARTERY.  RIGHT AXILLARY CANNULATION (N/A ) RADIAL ARTERY HARVEST (Left Arm Lower) TRANSESOPHAGEAL ECHOCARDIOGRAM (TEE) (N/A )  Patient Location: ICU  Anesthesia Type:General  Level of Consciousness: Patient remains intubated per anesthesia plan  Airway & Oxygen Therapy: Patient remains intubated per anesthesia plan  Post-op Assessment: Report given to RN and Post -op Vital signs reviewed and stable  Post vital signs: Reviewed and stable  Last Vitals:  Vitals Value Taken Time  BP    Temp    Pulse 89 08/06/2017  6:48 PM  Resp 17 08/06/2017  6:48 PM  SpO2 94 % 08/06/2017  6:48 PM  Vitals shown include unvalidated device data.  Last Pain:  Vitals:   08/06/17 0608  TempSrc: Oral      Patients Stated Pain Goal: 5 (50/15/86 8257)  Complications: No apparent anesthesia complications

## 2017-08-07 ENCOUNTER — Inpatient Hospital Stay (HOSPITAL_COMMUNITY): Payer: Medicare Other

## 2017-08-07 ENCOUNTER — Encounter (HOSPITAL_COMMUNITY): Payer: Self-pay | Admitting: Surgery

## 2017-08-07 LAB — PREPARE FRESH FROZEN PLASMA
Unit division: 0
Unit division: 0

## 2017-08-07 LAB — POCT I-STAT, CHEM 8
BUN: 18 mg/dL (ref 6–20)
CHLORIDE: 98 mmol/L — AB (ref 101–111)
Calcium, Ion: 1.07 mmol/L — ABNORMAL LOW (ref 1.15–1.40)
Creatinine, Ser: 1 mg/dL (ref 0.61–1.24)
Glucose, Bld: 148 mg/dL — ABNORMAL HIGH (ref 65–99)
HCT: 28 % — ABNORMAL LOW (ref 39.0–52.0)
Hemoglobin: 9.5 g/dL — ABNORMAL LOW (ref 13.0–17.0)
POTASSIUM: 3.7 mmol/L (ref 3.5–5.1)
SODIUM: 137 mmol/L (ref 135–145)
TCO2: 22 mmol/L (ref 22–32)

## 2017-08-07 LAB — GLUCOSE, CAPILLARY
GLUCOSE-CAPILLARY: 112 mg/dL — AB (ref 65–99)
GLUCOSE-CAPILLARY: 120 mg/dL — AB (ref 65–99)
GLUCOSE-CAPILLARY: 126 mg/dL — AB (ref 65–99)
GLUCOSE-CAPILLARY: 137 mg/dL — AB (ref 65–99)
GLUCOSE-CAPILLARY: 139 mg/dL — AB (ref 65–99)
GLUCOSE-CAPILLARY: 150 mg/dL — AB (ref 65–99)
GLUCOSE-CAPILLARY: 152 mg/dL — AB (ref 65–99)
Glucose-Capillary: 122 mg/dL — ABNORMAL HIGH (ref 65–99)
Glucose-Capillary: 125 mg/dL — ABNORMAL HIGH (ref 65–99)
Glucose-Capillary: 131 mg/dL — ABNORMAL HIGH (ref 65–99)
Glucose-Capillary: 144 mg/dL — ABNORMAL HIGH (ref 65–99)
Glucose-Capillary: 157 mg/dL — ABNORMAL HIGH (ref 65–99)
Glucose-Capillary: 121 mg/dL — ABNORMAL HIGH (ref 65–99)
Glucose-Capillary: 110 mg/dL — ABNORMAL HIGH (ref 65–99)
Glucose-Capillary: 135 mg/dL — ABNORMAL HIGH (ref 65–99)
Glucose-Capillary: 151 mg/dL — ABNORMAL HIGH (ref 65–99)

## 2017-08-07 LAB — CBC
HCT: 32.2 % — ABNORMAL LOW (ref 39.0–52.0)
HCT: 28 % — ABNORMAL LOW (ref 39.0–52.0)
Hemoglobin: 10.2 g/dL — ABNORMAL LOW (ref 13.0–17.0)
Hemoglobin: 9.3 g/dL — ABNORMAL LOW (ref 13.0–17.0)
MCH: 28.2 pg (ref 26.0–34.0)
MCH: 29.7 pg (ref 26.0–34.0)
MCHC: 31.7 g/dL (ref 30.0–36.0)
MCHC: 33.2 g/dL (ref 30.0–36.0)
MCV: 89 fL (ref 78.0–100.0)
MCV: 89.5 fL (ref 78.0–100.0)
PLATELETS: 146 10*3/uL — AB (ref 150–400)
PLATELETS: 147 10*3/uL — AB (ref 150–400)
RBC: 3.62 MIL/uL — AB (ref 4.22–5.81)
RBC: 3.13 MIL/uL — AB (ref 4.22–5.81)
RDW: 15.5 % (ref 11.5–15.5)
RDW: 16 % — ABNORMAL HIGH (ref 11.5–15.5)
WBC: 18.5 10*3/uL — AB (ref 4.0–10.5)
WBC: 17.8 10*3/uL — ABNORMAL HIGH (ref 4.0–10.5)

## 2017-08-07 LAB — BPAM FFP
BLOOD PRODUCT EXPIRATION DATE: 201903302359
Blood Product Expiration Date: 201903302359
ISSUE DATE / TIME: 201903261739
ISSUE DATE / TIME: 201903261739
Unit Type and Rh: 600
Unit Type and Rh: 6200

## 2017-08-07 LAB — PREPARE CRYOPRECIPITATE
UNIT DIVISION: 0
Unit division: 0

## 2017-08-07 LAB — POCT I-STAT 3, ART BLOOD GAS (G3+)
ACID-BASE DEFICIT: 2 mmol/L (ref 0.0–2.0)
Acid-base deficit: 2 mmol/L (ref 0.0–2.0)
BICARBONATE: 23.1 mmol/L (ref 20.0–28.0)
BICARBONATE: 23.3 mmol/L (ref 20.0–28.0)
O2 SAT: 93 %
O2 Saturation: 98 %
PH ART: 7.388 (ref 7.350–7.450)
PO2 ART: 74 mmHg — AB (ref 83.0–108.0)
Patient temperature: 37.7
TCO2: 24 mmol/L (ref 22–32)
TCO2: 25 mmol/L (ref 22–32)
pCO2 arterial: 38.5 mmHg (ref 32.0–48.0)
pCO2 arterial: 42.6 mmHg (ref 32.0–48.0)
pH, Arterial: 7.349 — ABNORMAL LOW (ref 7.350–7.450)
pO2, Arterial: 101 mmHg (ref 83.0–108.0)

## 2017-08-07 LAB — BPAM CRYOPRECIPITATE
BLOOD PRODUCT EXPIRATION DATE: 201903262330
Blood Product Expiration Date: 201903262330
ISSUE DATE / TIME: 201903261746
ISSUE DATE / TIME: 201903261746
Unit Type and Rh: 6200
Unit Type and Rh: 6200

## 2017-08-07 LAB — BASIC METABOLIC PANEL
ANION GAP: 10 (ref 5–15)
BUN: 12 mg/dL (ref 6–20)
CALCIUM: 7.2 mg/dL — AB (ref 8.9–10.3)
CO2: 23 mmol/L (ref 22–32)
Chloride: 105 mmol/L (ref 101–111)
Creatinine, Ser: 0.85 mg/dL (ref 0.61–1.24)
Glucose, Bld: 125 mg/dL — ABNORMAL HIGH (ref 65–99)
POTASSIUM: 3.8 mmol/L (ref 3.5–5.1)
SODIUM: 138 mmol/L (ref 135–145)

## 2017-08-07 LAB — MAGNESIUM
MAGNESIUM: 2.6 mg/dL — AB (ref 1.7–2.4)
Magnesium: 2.4 mg/dL (ref 1.7–2.4)

## 2017-08-07 LAB — CREATININE, SERUM
Creatinine, Ser: 1.18 mg/dL (ref 0.61–1.24)
GFR calc Af Amer: >60 mL/min (ref >60)
GFR calc non Af Amer: 59 mL/min — ABNORMAL LOW (ref >60)

## 2017-08-07 MED ORDER — FUROSEMIDE 10 MG/ML IJ SOLN
40.0000 mg | Freq: Once | INTRAMUSCULAR | Status: AC
  Administered 2017-08-08: 40 mg via INTRAVENOUS

## 2017-08-07 MED ORDER — POTASSIUM CHLORIDE 10 MEQ/50ML IV SOLN
10.0000 meq | INTRAVENOUS | Status: AC
  Administered 2017-08-07 (×3): 10 meq via INTRAVENOUS

## 2017-08-07 MED ORDER — INSULIN DETEMIR 100 UNIT/ML ~~LOC~~ SOLN
15.0000 [IU] | Freq: Once | SUBCUTANEOUS | Status: AC
  Administered 2017-08-07: 15 [IU] via SUBCUTANEOUS
  Filled 2017-08-07: qty 0.15

## 2017-08-07 MED ORDER — FUROSEMIDE 10 MG/ML IJ SOLN
40.0000 mg | Freq: Once | INTRAMUSCULAR | Status: AC
  Administered 2017-08-07: 40 mg via INTRAVENOUS

## 2017-08-07 MED ORDER — ISOSORBIDE MONONITRATE ER 30 MG PO TB24
30.0000 mg | ORAL_TABLET | Freq: Every day | ORAL | Status: DC
  Administered 2017-08-07 – 2017-08-11 (×5): 30 mg via ORAL
  Filled 2017-08-07 (×5): qty 1

## 2017-08-07 MED ORDER — INSULIN ASPART 100 UNIT/ML ~~LOC~~ SOLN
0.0000 [IU] | SUBCUTANEOUS | Status: DC
  Administered 2017-08-07 – 2017-08-08 (×4): 2 [IU] via SUBCUTANEOUS

## 2017-08-07 MED ORDER — ENOXAPARIN SODIUM 40 MG/0.4ML ~~LOC~~ SOLN
40.0000 mg | Freq: Every day | SUBCUTANEOUS | Status: DC
  Administered 2017-08-07 – 2017-08-08 (×2): 40 mg via SUBCUTANEOUS
  Filled 2017-08-07 (×2): qty 0.4

## 2017-08-07 MED FILL — Thrombin For Soln 20000 Unit: CUTANEOUS | Qty: 1 | Status: AC

## 2017-08-07 MED FILL — Dexmedetomidine HCl in NaCl 0.9% IV Soln 400 MCG/100ML: INTRAVENOUS | Qty: 100 | Status: AC

## 2017-08-07 MED FILL — Heparin Sodium (Porcine) Inj 1000 Unit/ML: INTRAMUSCULAR | Qty: 30 | Status: AC

## 2017-08-07 MED FILL — Magnesium Sulfate Inj 50%: INTRAMUSCULAR | Qty: 10 | Status: AC

## 2017-08-07 MED FILL — Potassium Chloride Inj 2 mEq/ML: INTRAVENOUS | Qty: 40 | Status: AC

## 2017-08-07 NOTE — Op Note (Signed)
CARDIOVASCULAR SURGERY OPERATIVE NOTE  08/06/2017  Surgeon:  Gaye Pollack, MD  First Assistant: Nicholes Rough,  PA-C   Preoperative Diagnosis:  Severe native multi-vessel coronary artery disease with occluded vein grafts s/p CABG and replacement of ascending aorta in 2013 at [facility].   Postoperative Diagnosis:  Same   Procedure:  1. RedoMedian Sternotomy 2.   Right axillary artery cannulation 3.   Extracorporeal circulation 3.   Redo Coronary artery bypass grafting x 4   Left radial artery graft to the diagonal  SVG to OM  SVG to PDA  SVG to PL2 4.   Endoscopic vein harvest from the left leg 5.   Left radial artery harvest 6.   Insertion of right femoral arterial line for BP monitoring.   Anesthesia:  General Endotracheal   Clinical History/Surgical Indication:  The patient is a 77 year old gentleman with hypertension, hyperlipidemia, atrial fibrillation and supraventricular tachycardia, chronic diastolic heart failure, and coronary disease who I had seen initially in June 2013 for a 5.1 cm ascending aortic aneurysm. We decided to continue following this with a repeat scan in 6 months. The patient had had prior history of coronary artery disease treated with stents and was followed by Dr. Verl Blalock. He subsequently underwent coronary bypass graft surgery and replacement of his ascending aorta at [facility] by Dr. Anne Shutter in 2013. He had a left internal mammary graft to the LAD, saphenous vein graft to the ramus, saphenous vein graft to the obtuse marginal, and a saphenous vein graft to the posterior lateral branch. He said that he did well for about 2-1/2 years and then began having some episodic chest discomfort similar to his prior chest pain. Over the past couple months he has had increasing episodes of chest discomfort as well as some exertional fatigue. He was evaluated in October with chest  pain and palpitations and has been followed by Dr. Curt Bears. He presently describes episodes of squeezing substernal chest discomfort with exertion like walking upstairs. He has had no associated shortness of breath. The symptom symptoms are similar to what he had prior to his bypass surgery. His last echocardiogram on 03/06/2017 showed an ejection fraction of 60-65% with grade 2 diastolic dysfunction. There was mild aortic insufficiency and aortic root was mildly dilated at 4.5 cm. There is moderate concentric left ventricular hypertrophy and severe focal basal septal hypertrophy. He underwent cardiac catheterization by Dr. Claiborne Billings on 07/11/2017. This showed a left ventricular ejection fraction of 50-55%. There was severe native three-vessel coronary disease with an 80% proximal calcified LAD stenosis in the region of the first diagonal branch with a 90% ostial first diagonal stenosis. The LAD has 60-70% diffuse stenosis after the second diagonal vessel with competitive filling beyond that from the left internal mammary graft. There was total ostial occlusion of left circumflex coronary artery at the prior stenting site. There were collaterals to 2 moderate-sized branches of the left circumflex. There is a large dominant right coronary artery with diffuse calcification. There is a 95% calcific rock-like stenosis in the region just beyond a large posterior descending branch that compromise to large posterior lateral branches. All the previous saphenous vein grafts were occluded at their origin. There is a patent left internal mammary graft to the mid LAD. LVEDP was 22.       He has severe multivessel coronary disease with occluded saphenous vein grafts and a patent left internal mammary graft to the LAD status post coronary bypass graft surgery in 2013. He has 2 moderate sized  obtuse marginal branches and a large dominant distal right coronary artery that are suitable for grafting. He has progressive anginal  symptoms of angina and a large dominant right coronary artery that is at risk due to a calcified rock-like lesion in the distal right coronary artery just beyond the posterior descending branch. Cardiology does not feel that PCI is a good option and therefore I think a redo coronary bypass graft surgery is indicated. His vein mapping shows that the left greater saphenous vein is present and patent and appears to be of adequate size to use as a bypass conduit. The right greater saphenous vein was previously harvested. The upper extremity peripheral arterial Dopplers show a normal palmar arch on the left with no reduction in flow with radial compression so I think the left radial artery could be used. The palmar arch signal on the right side decreased with radial compression and therefore the right radial artery is not available. His chest CTA shows the mid to distal ascending aorta has a maximum diameter of 4.4 cm beyond the aneurysm repair. I discussed the operative procedure with the patient and family including alternatives, benefits and risks; including but not limited to bleeding, blood transfusion, infection, stroke, myocardial infarction, graft failure, heart block requiring a permanent pacemaker, organ dysfunction, and death.Eric L Pryorunderstands and agrees to proceed.     Preparation:  The patient was seen in the preoperative holding area and the correct patient, correct operation were confirmed with the patient after reviewing the medical record and catheterization. The consent was signed by me. Preoperative antibiotics were given. A pulmonary arterial line and radial arterial line were placed by the anesthesia team. The patient was taken back to the operating room and positioned supine on the operating room table. After being placed under general endotracheal anesthesia by the anesthesia team a foley catheter was placed. The neck, chest, abdomen, and both legs were prepped with betadine  soap and solution and draped in the usual sterile manner. A surgical time-out was taken and the correct patient and operative procedure were confirmed with the nursing and anesthesia staff.  TEE:  TEE was performed by Dr. Margurite Auerbach at the beginning of the case.  This was reviewed by me.  Left ventricular systolic function appeared low normal with ejection fraction of 50-55%.  There is mild aortic insufficiency.  The aortic valve is trileaflet with normal leaflet separation and no stenosis.  The aortic root appeared to be of normal size.  There is trace mitral insufficiency.  Insertion of right femoral arterial line:  A long right femoral arterial catheter was inserted into the right common femoral artery using Seldinger technique without difficulty. Good arterial BP tracing.  Left radial artery harvest:  The left arm had been prepped out on an arm board.  A longitudinal incision was made over the distal radial artery and the artery was exposed and encircled with a vessel loop.  An atraumatic vascular clamp was placed across the radial artery and there was still a good Doppler signal beyond the clamp indicating good flow through the palmar arch.  The clamp was removed.  The longitudinal incision was carried over the course of the radial artery.  The artery was harvested as a pedicle including the vein on either side of it.  The harmonic scalpel was used to divide the branches.  Larger branches were clipped.  The artery was divided distally at the wrist and the stump was suture-ligated with a 2-0 silk suture.  The  proximal end of the radial artery was then ligated and the stump oversewn with a 2-0 silk suture.  The radial artery was flushed with papaverine saline solution.  It was a large caliber vessel with somewhat thickened walls.   Endoscopic vein harvest:  The left greater saphenous vein was harvested endoscopically through a 2 cm incision medial to the left knee. It was harvested from the  upper thigh to below the knee. It was a medium-sized vein of good quality. The side branches were all ligated with 4-0 silk ties.    Redo Sternotomy:  A redo median sternotomy was performed.  The sternal wires were removed and the sternal open using the oscillating saw without difficulty.  The sternal edges were retracted with bone hooks and the heart was dissected from the back of the sternum using electrocautery.  Dissection was performed to expose the right atrium and a sending aortic graft and native distal ascending aorta.  There were dense adhesions present and this took considerable time.  At the patient's initial surgery the proximal ascending aorta had been replaced with a Dacron graft from the sinotubular junction to the mid a sending aorta.  The remainder of the ascending aorta up to the aortic arch was noted to be dilated to about 4.4 cm on CT scan but I did not feel that this indicated replacement especially considering that he was undergoing redo coronary bypass graft surgery.  I thought it would be best to cannulate the right axillary artery to avoid having to put a cannula into the enlarged distal ascending aorta and to have enough space to place the cross-clamp.  There were no contraindications to cross-clamping.   Right axillary artery exposure and cannulation:  A transverse incision was made below the right clavicle. The pectoralis major muscle was split along its fibers and the pectoralis minor muscle was retracted laterally. The brachial plexus was identified and gently retracted laterally to expose the axillary artery. The artery was controlled proximally and distally with vessel loops. The patient was fully heparinized and ACT maintained greater than 400. The axillary artery was clamped proximally and distally with peripheral Debakey clamps. It was opened longitudinally. There was no sign of dissection here. An 8 mm Hemashield dacron graft was anastomosed in an end to side manner  using continuous 5-0 prolene suture. CoSeal was applied for hemostasis and the clamp removed. The graft was connected to the arterial end of the bypass circuit.  Cardiopulmonary bypass:  Venous cannulation was performed via the right atrial appendage using a two-staged venous cannula. An antegrade cardioplegia/vent cannula was inserted into the mid-ascending aorta.  The patient was placed on cardiopulmonary bypass and is much of the heart as possible was dissected free from the pericardial adhesions.  These adhesions were fairly dense and this took a considerable amount of time.  A retrograde cardioplegia cannula was inserted through the right atrium into the coronary sinus without difficulty.  Aortic occlusion was performed with a single cross-clamp. Systemic cooling to 28 degrees Centigrade and topical cooling of the heart with iced saline were used. Hyperkalemic antegrade followed by retrograde cold blood cardioplegia was used to induce diastolic arrest and was then given at about 20 minute intervals throughout the period of arrest to maintain myocardial temperature at or below 10 degrees centigrade.  And atraumatic vascular clamp was placed across the left internal mammary artery pedicle.  The remainder of the heart was dissected free from the pericardium.  A temperature probe was inserted into the  interventricular septum and an insulating pad was placed in the pericardium.     Coronary arteries:  The coronary arteries were examined.   LAD:  Large vessel with no distal disease. There was a moderate caliber diagonal branch that was suitable for grafting and communicated with the proximal LAD and septal perforators.  LCX: The obtuse marginal branch was a moderate caliber vessel with no significant distal disease.  RCA: The posterior descending and second posterior lateral branches both moderate caliber vessels with no distal disease.   Grafts:  1. Left radial artery to the diagonal: 1.75 mm.  It was sewn end to side using 7-0 prolene continuous suture. 2. SVG to OM:  1.75 mm. It was sewn end to side using 7-0 prolene continuous suture. 3. SVG to PDA:  1.75 mm. It was sewn end to side using 7-0 prolene continuous suture. 4. SVG to PL2:  1.6 mm. It was sewn end to side using 7-0 prolene continuous suture.  Exposure of these vessels was difficult due to the redo nature of the surgery and the fact that the patient's heart was lying towards the left side.  There is also a large amount of noncoronary collateral flow making visualization of the distal anastomoses difficult and requiring more time to complete.  The proximal vein graft anastomoses of the PDA and OM vein grafts were performed to the ascending aortic graft using continuous 6-0 prolene suture since these vein grafts were larger caliber. The proximal anastomosis of the PL2  vein graft was performed to the proximal portion of the PDA vein graft.  The proximal anastomosis of the left radial artery graft was performed to the proximal portion of the OM vein graft graft markers were placed around the proximal anastomoses that were anastomosed directly to the aortic graft.   Completion:  The patient was rewarmed to 37 degrees Centigrade. The clamp was removed from the LIMA pedicle and there was rapid warming of the septum and return of ventricular fibrillation. The crossclamp was removed with a time of 210 minutes. There was defibrillated into sinus rhythm. The distal and proximal anastomoses were checked for hemostasis. The position of the grafts was satisfactory. Two temporary epicardial pacing wires were placed on the right atrium and two on the right ventricle. The patient was weaned from CPB without difficulty on dopamine 3 mcg.  CPB time was 281 minutes.  The pulmonary artery catheter was not working at this point so we could not obtain a cardiac output but TEE showed normal left ventricular systolic function with unchanged mild aortic  insufficiency and trivial mitral regurgitation.  Heparin was fully reversed with protamine and the venous cannula removed. Hemostasis was achieved.  The right axillary artery graft was ligated 1 cm from the anastomosis using a heavy silk tie and then divided and the stump was suture-ligated with a 3-0 Prolene pledgeted horizontal mattress suture.  Mediastinal and right pleural drainage tubes were placed.  I attempted to find the left pleural space but it appeared obliterated by adhesions and therefore a tube was not placed on the left side.  The sternum was closed with double #6 stainless steel wires. The fascia was closed with continuous # 1 vicryl suture. The subcutaneous tissue was closed with 2-0 vicryl continuous suture. The skin was closed with 3-0 vicryl subcuticular suture. All sponge, needle, and instrument counts were reported correct at the end of the case. Dry sterile dressings were placed over the incisions and around the chest tubes which were connected  to pleurevac suction. The patient was then transported to the surgical intensive care unit in critical but stable condition.

## 2017-08-07 NOTE — Procedures (Signed)
Extubation Procedure Note  Patient Details:   Name: Eric Beltran DOB: 04/11/1941 MRN: [id number]   Airway Documentation:     Evaluation  O2 sats: stable throughout Complications: No apparent complications Patient did tolerate procedure well. Bilateral Breath Sounds: Diminished   Yes   Pulmonary mechanics done prior to extubation: VC=760, NIF-26, patient had positive cuff leak. Pt extubated to nasal cannula 4L and achieved 750 on IS with RN. Pt able to speak afterwards and no stridor noted. RT to cont to monitor.   Melitza Metheny F 08/07/2017, 1:08 AM

## 2017-08-07 NOTE — Progress Notes (Signed)
Patient ID: Eric Beltran, male   DOB: Feb 19, 1941, 77 y.o.   MRN: [id number] TCTS Evening Rounds:  Hemodynamically stable today. Walked, up in chair. Pain under control Urine output ok. Still rose colored.  BMET    Component Value Date/Time   NA 137 08/07/2017 1657   NA 144 07/08/2017 1649   K 3.7 08/07/2017 1657   CL 98 (L) 08/07/2017 1657   CO2 23 08/07/2017 0419   GLUCOSE 148 (H) 08/07/2017 1657   BUN 18 08/07/2017 1657   BUN 23 07/08/2017 1649   CREATININE 1.00 08/07/2017 1657   CREATININE 0.96 11/12/2011 1117   CALCIUM 7.2 (L) 08/07/2017 0419   GFRNONAA 59 (L) 08/07/2017 1639   GFRAA >60 08/07/2017 1639   CBC    Component Value Date/Time   WBC 17.8 (H) 08/07/2017 1639   RBC 3.13 (L) 08/07/2017 1639   HGB 9.5 (L) 08/07/2017 1657   HGB 13.1 07/08/2017 1649   HCT 28.0 (L) 08/07/2017 1657   HCT 38.2 07/08/2017 1649   PLT 147 (L) 08/07/2017 1639   PLT 266 07/08/2017 1649   MCV 89.5 08/07/2017 1639   MCV 83 07/08/2017 1649   MCH 29.7 08/07/2017 1639   MCHC 33.2 08/07/2017 1639   RDW 16.0 (H) 08/07/2017 1639   RDW 15.7 (H) 07/08/2017 1649   LYMPHSABS 1.5 07/08/2017 1649   MONOABS 1.0 06/11/2012 1954   EOSABS 0.2 07/08/2017 1649   BASOSABS 0.1 07/08/2017 1649   A/P: stable day. Will diurese further this pm.

## 2017-08-07 NOTE — Progress Notes (Signed)
1 Day Post-Op Procedure(s) (LRB): REDO CORONARY ARTERY BYPASS GRAFTING (CABG) TIMES FOUR UTILIZING LEFT GREATER SAPHENOUS VEIN HAVESTED ENDOVASCULARLY, LEFT RADIAL ARTERY.  RIGHT AXILLARY CANNULATION (N/A) RADIAL ARTERY HARVEST (Left) TRANSESOPHAGEAL ECHOCARDIOGRAM (TEE) (N/A) Subjective: No complaints  Objective: Vital signs in last 24 hours: Temp:  [95.5 F (35.3 C)-99.9 F (37.7 C)] 99.5 F (37.5 C) (03/27 0715) Pulse Rate:  [79-89] 79 (03/27 0715) Resp:  [11-36] 18 (03/27 0715) BP: (88-132)/(47-78) 105/65 (03/27 0700) SpO2:  [93 %-100 %] 96 % (03/27 0715) Arterial Line BP: (103-159)/(53-82) 135/56 (03/27 0715) FiO2 (%):  [40 %-100 %] 40 % (03/27 0028) Weight:  [91.5 kg (201 lb 11.5 oz)] 91.5 kg (201 lb 11.5 oz) (03/27 0500)  Hemodynamic parameters for last 24 hours: PAP: (16-45)/(3-21) 21/14 CO:  [3.6 L/min-5.1 L/min] 5.1 L/min CI:  [1.8 L/min/m2-2.5 L/min/m2] 2.5 L/min/m2  Intake/Output from previous day: 03/26 0701 - 03/27 0700 In: 5206.5 [I.V.:3336.5; Blood:1390; NG/GT:30; IV Piggyback:450] Out: 9509 [Urine:5680; Blood:1300; Chest Tube:280] Intake/Output this shift: No intake/output data recorded.  General appearance: alert and cooperative Neurologic: intact Heart: regular rate and rhythm, S1, S2 normal, no murmur, click, rub or gallop Lungs: clear to auscultation bilaterally Abdomen: soft, non-tender; bowel sounds normal; no masses,  no organomegaly Extremities: edema moderate Wound: dressings dry Left hand neurovascularly intact  Lab Results: Recent Labs    08/06/17 2302 08/06/17 2306 08/07/17 0419  WBC 17.5*  --  18.5*  HGB 10.7* 10.5* 10.2*  HCT 33.2* 31.0* 32.2*  PLT 148*  --  146*   BMET:  Recent Labs    08/05/17 0846  08/06/17 2306 08/07/17 0419  NA 138   < > 141 138  K 4.1   < > 4.2 3.8  CL 106   < > 105 105  CO2 22  --   --  23  GLUCOSE 137*   < > 142* 125*  BUN 18   < > 11 12  CREATININE 0.88   < > 0.60* 0.85  CALCIUM 9.3  --   --   7.2*   < > = values in this interval not displayed.    PT/INR:  Recent Labs    08/06/17 1841  LABPROT 17.2*  INR 1.41   ABG    Component Value Date/Time   PHART 7.349 (L) 08/07/2017 0156   HCO3 23.3 08/07/2017 0156   TCO2 25 08/07/2017 0156   ACIDBASEDEF 2.0 08/07/2017 0156   O2SAT 93.0 08/07/2017 0156   CBG (last 3)  Recent Labs    08/07/17 0307 08/07/17 0417 08/07/17 0508  GLUCAP 125* 126* 122*   CXR: LLL atelectasis, small left ptx  ECG: sinus brady 51, possible old IMI, unchanged from preop.  Assessment/Plan: S/P Procedure(s) (LRB): REDO CORONARY ARTERY BYPASS GRAFTING (CABG) TIMES FOUR UTILIZING LEFT GREATER SAPHENOUS VEIN HAVESTED ENDOVASCULARLY, LEFT RADIAL ARTERY.  RIGHT AXILLARY CANNULATION (N/A) RADIAL ARTERY HARVEST (Left) TRANSESOPHAGEAL ECHOCARDIOGRAM (TEE) (N/A)  POD 1 Hemodynamically stable Rhythm under pacer is sinus brady 45-50. Will hold beta blocker for now and continue atrial pacing. Preop was sinus brady 50's on Toprol 100 mg daily.  DC swan, arterial lines.  Hyperglycemia: preop Hgb A1c was 5.8 on no meds. Will give a dose of Levemir this am and continue SSI. DC insulin drip.  Continue Lipitor for dyslipidemia with low HDL. His old vein grafts closed up fairly quickly with good quality vein and good distal vessels so he needs maximum attention to lipid management by cardiology.  DC chest tubes today.  OOB, IS.  Volume excess: start diuresis.     LOS: 1 day    Gaye Pollack 08/07/2017

## 2017-08-08 ENCOUNTER — Inpatient Hospital Stay (HOSPITAL_COMMUNITY): Payer: Medicare Other

## 2017-08-08 LAB — CBC
HCT: 23.8 % — ABNORMAL LOW (ref 39.0–52.0)
Hemoglobin: 7.6 g/dL — ABNORMAL LOW (ref 13.0–17.0)
MCH: 28.4 pg (ref 26.0–34.0)
MCHC: 31.9 g/dL (ref 30.0–36.0)
MCV: 88.8 fL (ref 78.0–100.0)
PLATELETS: 120 10*3/uL — AB (ref 150–400)
RBC: 2.68 MIL/uL — ABNORMAL LOW (ref 4.22–5.81)
RDW: 15.7 % — ABNORMAL HIGH (ref 11.5–15.5)
WBC: 15.2 10*3/uL — ABNORMAL HIGH (ref 4.0–10.5)

## 2017-08-08 LAB — POCT I-STAT, CHEM 8
BUN: 19 mg/dL (ref 6–20)
CALCIUM ION: 1.1 mmol/L — AB (ref 1.15–1.40)
Chloride: 95 mmol/L — ABNORMAL LOW (ref 101–111)
Creatinine, Ser: 0.9 mg/dL (ref 0.61–1.24)
GLUCOSE: 132 mg/dL — AB (ref 65–99)
HCT: 25 % — ABNORMAL LOW (ref 39.0–52.0)
HEMOGLOBIN: 8.5 g/dL — AB (ref 13.0–17.0)
Potassium: 4.1 mmol/L (ref 3.5–5.1)
SODIUM: 134 mmol/L — AB (ref 135–145)
TCO2: 26 mmol/L (ref 22–32)

## 2017-08-08 LAB — BASIC METABOLIC PANEL
Anion gap: 9 (ref 5–15)
BUN: 14 mg/dL (ref 6–20)
CALCIUM: 7.2 mg/dL — AB (ref 8.9–10.3)
CO2: 22 mmol/L (ref 22–32)
Chloride: 103 mmol/L (ref 101–111)
Creatinine, Ser: 0.79 mg/dL (ref 0.61–1.24)
GFR calc non Af Amer: >60 mL/min (ref >60)
GLUCOSE: 111 mg/dL — AB (ref 65–99)
Potassium: 3.7 mmol/L (ref 3.5–5.1)
Sodium: 134 mmol/L — ABNORMAL LOW (ref 135–145)

## 2017-08-08 LAB — GLUCOSE, CAPILLARY
GLUCOSE-CAPILLARY: 114 mg/dL — AB (ref 65–99)
Glucose-Capillary: 139 mg/dL — ABNORMAL HIGH (ref 65–99)

## 2017-08-08 MED ORDER — POTASSIUM CHLORIDE CRYS ER 20 MEQ PO TBCR
40.0000 meq | EXTENDED_RELEASE_TABLET | Freq: Two times a day (BID) | ORAL | Status: AC
  Administered 2017-08-08 (×2): 40 meq via ORAL
  Filled 2017-08-08 (×2): qty 2

## 2017-08-08 MED ORDER — FUROSEMIDE 10 MG/ML IJ SOLN
40.0000 mg | Freq: Two times a day (BID) | INTRAMUSCULAR | Status: AC
Start: 2017-08-08 — End: 2017-08-08
  Administered 2017-08-08 (×2): 40 mg via INTRAVENOUS
  Filled 2017-08-08 (×2): qty 4

## 2017-08-08 MED ORDER — AMIODARONE HCL IN DEXTROSE 360-4.14 MG/200ML-% IV SOLN
INTRAVENOUS | Status: AC
Start: 2017-08-08 — End: 2017-08-08
  Administered 2017-08-08: 60 mg/h via INTRAVENOUS
  Filled 2017-08-08: qty 200

## 2017-08-08 MED ORDER — AMIODARONE HCL IN DEXTROSE 360-4.14 MG/200ML-% IV SOLN
60.0000 mg/h | INTRAVENOUS | Status: DC
  Administered 2017-08-08: 60 mg/h via INTRAVENOUS
  Filled 2017-08-08: qty 200

## 2017-08-08 MED ORDER — AMIODARONE HCL IN DEXTROSE 360-4.14 MG/200ML-% IV SOLN
30.0000 mg/h | INTRAVENOUS | Status: DC
  Administered 2017-08-08: 30 mg/h via INTRAVENOUS
  Filled 2017-08-08: qty 200

## 2017-08-08 MED ORDER — METOLAZONE 2.5 MG PO TABS
2.5000 mg | ORAL_TABLET | Freq: Once | ORAL | Status: AC
  Administered 2017-08-08: 2.5 mg via ORAL
  Filled 2017-08-08: qty 1

## 2017-08-08 MED ORDER — AMIODARONE LOAD VIA INFUSION
150.0000 mg | Freq: Once | INTRAVENOUS | Status: AC
  Administered 2017-08-08: 150 mg via INTRAVENOUS
  Filled 2017-08-08: qty 83.34

## 2017-08-08 MED ORDER — WHITE PETROLATUM EX OINT
TOPICAL_OINTMENT | CUTANEOUS | Status: DC | PRN
  Administered 2017-08-08: via TOPICAL
  Filled 2017-08-08: qty 28.35

## 2017-08-08 MED ORDER — AMIODARONE IV BOLUS ONLY 150 MG/100ML
150.0000 mg | Freq: Once | INTRAVENOUS | Status: AC
  Administered 2017-08-08: 150 mg via INTRAVENOUS

## 2017-08-08 MED ORDER — FE FUMARATE-B12-VIT C-FA-IFC PO CAPS
1.0000 | ORAL_CAPSULE | Freq: Three times a day (TID) | ORAL | Status: DC
  Administered 2017-08-08 – 2017-08-11 (×10): 1 via ORAL
  Filled 2017-08-08 (×8): qty 1
  Filled 2017-08-08: qty 2

## 2017-08-08 MED ORDER — METOPROLOL TARTRATE 25 MG PO TABS
25.0000 mg | ORAL_TABLET | Freq: Two times a day (BID) | ORAL | Status: DC
  Administered 2017-08-08 – 2017-08-11 (×7): 25 mg via ORAL
  Filled 2017-08-08 (×7): qty 1

## 2017-08-08 MED ORDER — AMIODARONE HCL IN DEXTROSE 360-4.14 MG/200ML-% IV SOLN
30.0000 mg/h | INTRAVENOUS | Status: DC
  Administered 2017-08-08 – 2017-08-09 (×2): 30 mg/h via INTRAVENOUS
  Filled 2017-08-08 (×2): qty 200

## 2017-08-08 MED FILL — Mannitol IV Soln 20%: INTRAVENOUS | Qty: 500 | Status: AC

## 2017-08-08 MED FILL — Lidocaine HCl IV Inj 20 MG/ML: INTRAVENOUS | Qty: 10 | Status: AC

## 2017-08-08 MED FILL — Sodium Bicarbonate IV Soln 8.4%: INTRAVENOUS | Qty: 50 | Status: AC

## 2017-08-08 MED FILL — Potassium Chloride Inj 2 mEq/ML: INTRAVENOUS | Qty: 20 | Status: AC

## 2017-08-08 MED FILL — Heparin Sodium (Porcine) Inj 1000 Unit/ML: INTRAMUSCULAR | Qty: 20 | Status: AC

## 2017-08-08 MED FILL — Sodium Chloride IV Soln 0.9%: INTRAVENOUS | Qty: 3000 | Status: AC

## 2017-08-08 MED FILL — Electrolyte-R (PH 7.4) Solution: INTRAVENOUS | Qty: 7000 | Status: AC

## 2017-08-08 NOTE — Progress Notes (Signed)
Patient ID: Eric Beltran, male   DOB: 22-Oct-1940, 77 y.o.   MRN: [id number] EVENING ROUNDS NOTE :     [address]       [place] [id number]             [phone]                 2 Days Post-Op Procedure(s) (LRB): REDO CORONARY ARTERY BYPASS GRAFTING (CABG) TIMES FOUR UTILIZING LEFT GREATER SAPHENOUS VEIN HAVESTED ENDOVASCULARLY, LEFT RADIAL ARTERY.  RIGHT AXILLARY CANNULATION (N/A) RADIAL ARTERY HARVEST (Left) TRANSESOPHAGEAL ECHOCARDIOGRAM (TEE) (N/A)  Total Length of Stay:  LOS: 2 days  BP 113/73   Pulse 64   Temp 98.4 F (36.9 C) (Oral)   Resp 16   Wt 204 lb 12.9 oz (92.9 kg)   SpO2 94%   BMI 32.08 kg/m   .Intake/Output      03/27 0701 - 03/28 0700 03/28 0701 - 03/29 0700   P.O. 360 120   I.V. (mL/kg) 501 (5.4) 1906.7 (20.5)   Blood     NG/GT     IV Piggyback 450 100   Total Intake(mL/kg) 1311 (14.1) 2126.7 (22.9)   Urine (mL/kg/hr) 2035 (0.9) 1050 (1)   Stool 0 0   Blood     Chest Tube 140    Total Output 2175 1050   Net -864 +1076.7        Stool Occurrence 1 x 1 x     . sodium chloride Stopped (08/07/17 0700)  . amiodarone 30 mg/hr (08/08/17 1204)  . lactated ringers Stopped (08/07/17 1600)  . lactated ringers 10 mL/hr at 08/08/17 0800     Lab Results  Component Value Date   WBC 15.2 (H) 08/08/2017   HGB 8.5 (L) 08/08/2017   HCT 25.0 (L) 08/08/2017   PLT 120 (L) 08/08/2017   GLUCOSE 132 (H) 08/08/2017   CHOL 130 03/06/2017   TRIG 262 (H) 03/06/2017   HDL 28 (L) 03/06/2017   LDLCALC 50 03/06/2017   ALT 20 08/05/2017   AST 21 08/05/2017   NA 134 (L) 08/08/2017   K 4.1 08/08/2017   CL 95 (L) 08/08/2017   CREATININE 0.90 08/08/2017   BUN 19 08/08/2017   CO2 22 08/08/2017   TSH 1.170 03/05/2017   PSA 16.87 (H) 03/01/2010   INR 1.41 08/06/2017   HGBA1C 5.8 (H) 08/05/2017   Waked 2 times around the unit Still with bloody urine, foley in Tyson Alias MD  Beeper [phone] Office [phone] 08/08/2017 6:32  PM

## 2017-08-08 NOTE — Progress Notes (Signed)
Dr. Cyndia Bent paged and made aware of drop in urine output, ~60cc since 1900, and pt going into Afib with a rate 110-140s. Orders received and will enact.  Will continue to monitor closely, Eleonore Chiquito RN 2 Heart [facility]

## 2017-08-08 NOTE — Progress Notes (Signed)
2 Days Post-Op Procedure(s) (LRB): REDO CORONARY ARTERY BYPASS GRAFTING (CABG) TIMES FOUR UTILIZING LEFT GREATER SAPHENOUS VEIN HAVESTED ENDOVASCULARLY, LEFT RADIAL ARTERY.  RIGHT AXILLARY CANNULATION (N/A) RADIAL ARTERY HARVEST (Left) TRANSESOPHAGEAL ECHOCARDIOGRAM (TEE) (N/A) Subjective: No complaints  Had some problems with foley draining overnight but flushed and started working after that. Some clots present.  Went into atrial fibrillation with RVR overnight. Started on amiodarone. He has had some atrial fib in the past.  Objective: Vital signs in last 24 hours: Temp:  [97.2 F (36.2 C)-99.5 F (37.5 C)] 98.4 F (36.9 C) (03/28 0433) Pulse Rate:  [62-145] 117 (03/28 0700) Cardiac Rhythm: Atrial fibrillation (03/28 0751) Resp:  [12-28] 17 (03/28 0700) BP: (92-140)/(57-91) 118/82 (03/28 0700) SpO2:  [92 %-100 %] 93 % (03/28 0700) Arterial Line BP: (129-162)/(56-69) 162/69 (03/27 1100) Weight:  [92.9 kg (204 lb 12.9 oz)] 92.9 kg (204 lb 12.9 oz) (03/28 0500)  Hemodynamic parameters for last 24 hours: PAP: (17)/(8-13) 17/8  Intake/Output from previous day: 03/27 0701 - 03/28 0700 In: 1311 [P.O.:360; I.V.:501; IV Piggyback:450] Out: 2175 [Urine:2035; Chest Tube:140] Intake/Output this shift: Total I/O In: -  Out: 75 [Urine:75]  General appearance: alert and cooperative Neurologic: intact Heart: irregularly irregular rhythm Lungs: diminished breath sounds over LLL and tubular Extremities: edema mild Wound: dressings dry  Lab Results: Recent Labs    08/07/17 1639 08/07/17 1657 08/08/17 0423  WBC 17.8*  --  15.2*  HGB 9.3* 9.5* 7.6*  HCT 28.0* 28.0* 23.8*  PLT 147*  --  120*   BMET:  Recent Labs    08/07/17 0419  08/07/17 1657 08/08/17 0423  NA 138  --  137 134*  K 3.8  --  3.7 3.7  CL 105  --  98* 103  CO2 23  --   --  22  GLUCOSE 125*  --  148* 111*  BUN 12  --  18 14  CREATININE 0.85   < > 1.00 0.79  CALCIUM 7.2*  --   --  7.2*   < > = values in  this interval not displayed.    PT/INR:  Recent Labs    08/06/17 1841  LABPROT 17.2*  INR 1.41   ABG    Component Value Date/Time   PHART 7.349 (L) 08/07/2017 0156   HCO3 23.3 08/07/2017 0156   TCO2 22 08/07/2017 1657   ACIDBASEDEF 2.0 08/07/2017 0156   O2SAT 93.0 08/07/2017 0156   CBG (last 3)  Recent Labs    08/07/17 1957 08/08/17 0016 08/08/17 0432  GLUCAP 152* 139* 114*   CXR: LLL atelectasis and left pleural effusion  Assessment/Plan: S/P Procedure(s) (LRB): REDO CORONARY ARTERY BYPASS GRAFTING (CABG) TIMES FOUR UTILIZING LEFT GREATER SAPHENOUS VEIN HAVESTED ENDOVASCULARLY, LEFT RADIAL ARTERY.  RIGHT AXILLARY CANNULATION (N/A) RADIAL ARTERY HARVEST (Left) TRANSESOPHAGEAL ECHOCARDIOGRAM (TEE) (N/A)  POD 2 Hemodynamically stable Postop atrial fibrillation with RVR: started on amiodarone and has received 2 boluses. Continue IV for now. Start Lopressor. Hold off on anticoagulation for now.  Expected acute postop blood loss anemia: Hgb dropped from 9.5-7.6 overnight with no visible blood loss. Will start iron and repeat in am as long as he is stable.   Expected volume excess: wt is up 3 lbs from yesterday although he was -800 cc yesterday. Will continue diuresis today. Keep foley in due to BPH and some blood in urine after foley insertion.  LLL atelectasis and effusion : continue IS. Will repeat CXR in am and if effusion increases may need thoracentesis.  Ambulate.  DM: glucose under good control and preop Hgb A1c was 5.8 so can stop CBG's and SSI.  Discussed status with son at bedside.   LOS: 2 days    Gaye Pollack 08/08/2017

## 2017-08-08 NOTE — Progress Notes (Signed)
  Amiodarone Drug - Drug Interaction Consult Note  Recommendations: Monitor electrolytes. Amiodarone is metabolized by the cytochrome P450 system and therefore has the potential to cause many drug interactions. Amiodarone has an average plasma half-life of 50 days (range 20 to 100 days).   There is potential for drug interactions to occur several weeks or months after stopping treatment and the onset of drug interactions may be slow after initiating amiodarone.   [x]  Statins: Increased risk of myopathy. Simvastatin- restrict dose to 20mg  daily. Other statins: counsel patients to report any muscle pain or weakness immediately.  []  Anticoagulants: Amiodarone can increase anticoagulant effect. Consider warfarin dose reduction. Patients should be monitored closely and the dose of anticoagulant altered accordingly, remembering that amiodarone levels take several weeks to stabilize.  []  Antiepileptics: Amiodarone can increase plasma concentration of phenytoin, the dose should be reduced. Note that small changes in phenytoin dose can result in large changes in levels. Monitor patient and counsel on signs of toxicity.  []  Beta blockers: increased risk of bradycardia, AV block and myocardial depression. Sotalol - avoid concomitant use.  []   Calcium channel blockers (diltiazem and verapamil): increased risk of bradycardia, AV block and myocardial depression.  []   Cyclosporine: Amiodarone increases levels of cyclosporine. Reduced dose of cyclosporine is recommended.  []  Digoxin dose should be halved when amiodarone is started.  [x]  Diuretics: increased risk of cardiotoxicity if hypokalemia occurs.  []  Oral hypoglycemic agents (glyburide, glipizide, glimepiride): increased risk of hypoglycemia. Patient's glucose levels should be monitored closely when initiating amiodarone therapy.   []  Drugs that prolong the QT interval:  Torsades de pointes risk may be increased with concurrent use - avoid if  possible.  Monitor QTc, also keep magnesium/potassium WNL if concurrent therapy can't be avoided. [name] Antibiotics: e.g. fluoroquinolones, erythromycin. . Antiarrhythmics: e.g. quinidine, procainamide, disopyramide, sotalol. . Antipsychotics: e.g. phenothiazines, haloperidol.  . Lithium, tricyclic antidepressants, and methadone. Thank You,  Excell Seltzer Poteet  08/08/2017 12:55 AM

## 2017-08-08 NOTE — Progress Notes (Signed)
Pt stating that he is having some difficulty breathing. 2L [place] 96%. BBS equal and dim/clear. Pt also stating that his abdomen feel tight. RN bladder scanned patient, 189 cc urine. Foley catheter flushed x20 cc NS, bloody clots and red urine drainage.  RN will continue to monitor.

## 2017-08-09 ENCOUNTER — Inpatient Hospital Stay (HOSPITAL_COMMUNITY): Payer: Medicare Other

## 2017-08-09 LAB — BASIC METABOLIC PANEL
Anion gap: 8 (ref 5–15)
BUN: 20 mg/dL (ref 6–20)
CALCIUM: 7.9 mg/dL — AB (ref 8.9–10.3)
CHLORIDE: 99 mmol/L — AB (ref 101–111)
CO2: 27 mmol/L (ref 22–32)
Creatinine, Ser: 1.13 mg/dL (ref 0.61–1.24)
Glucose, Bld: 114 mg/dL — ABNORMAL HIGH (ref 65–99)
Potassium: 3.6 mmol/L (ref 3.5–5.1)
SODIUM: 134 mmol/L — AB (ref 135–145)

## 2017-08-09 LAB — CBC
HCT: 25.3 % — ABNORMAL LOW (ref 39.0–52.0)
HEMOGLOBIN: 8.4 g/dL — AB (ref 13.0–17.0)
MCH: 29.4 pg (ref 26.0–34.0)
MCHC: 33.2 g/dL (ref 30.0–36.0)
MCV: 88.5 fL (ref 78.0–100.0)
Platelets: 138 10*3/uL — ABNORMAL LOW (ref 150–400)
RBC: 2.86 MIL/uL — ABNORMAL LOW (ref 4.22–5.81)
RDW: 15.8 % — ABNORMAL HIGH (ref 11.5–15.5)
WBC: 17.2 10*3/uL — ABNORMAL HIGH (ref 4.0–10.5)

## 2017-08-09 MED ORDER — AMIODARONE IV BOLUS ONLY 150 MG/100ML
150.0000 mg | Freq: Once | INTRAVENOUS | Status: AC
  Administered 2017-08-09: 150 mg via INTRAVENOUS

## 2017-08-09 MED ORDER — ASPIRIN EC 325 MG PO TBEC
325.0000 mg | DELAYED_RELEASE_TABLET | Freq: Every day | ORAL | Status: DC
  Administered 2017-08-10 – 2017-08-11 (×2): 325 mg via ORAL
  Filled 2017-08-09 (×2): qty 1

## 2017-08-09 MED ORDER — ONDANSETRON HCL 4 MG PO TABS: 4.0000 mg | ORAL_TABLET | Freq: Four times a day (QID) | ORAL | Status: DC | PRN

## 2017-08-09 MED ORDER — ACETAMINOPHEN 325 MG PO TABS: 650.0000 mg | ORAL_TABLET | Freq: Four times a day (QID) | ORAL | Status: DC | PRN

## 2017-08-09 MED ORDER — OXYCODONE HCL 5 MG PO TABS
5.0000 mg | ORAL_TABLET | ORAL | Status: DC | PRN
  Filled 2017-08-09: qty 2

## 2017-08-09 MED ORDER — ONDANSETRON HCL 4 MG/2ML IJ SOLN: 4.0000 mg | Freq: Four times a day (QID) | INTRAMUSCULAR | Status: DC | PRN

## 2017-08-09 MED ORDER — FUROSEMIDE 10 MG/ML IJ SOLN
40.0000 mg | Freq: Once | INTRAMUSCULAR | Status: AC
  Administered 2017-08-09: 40 mg via INTRAVENOUS
  Filled 2017-08-09: qty 4

## 2017-08-09 MED ORDER — SODIUM CHLORIDE 0.9% FLUSH: 3.0000 mL | INTRAVENOUS | Status: DC | PRN

## 2017-08-09 MED ORDER — PANTOPRAZOLE SODIUM 40 MG PO TBEC
40.0000 mg | DELAYED_RELEASE_TABLET | Freq: Every day | ORAL | Status: DC
  Administered 2017-08-10 – 2017-08-11 (×2): 40 mg via ORAL
  Filled 2017-08-09 (×2): qty 1

## 2017-08-09 MED ORDER — METOLAZONE 2.5 MG PO TABS
2.5000 mg | ORAL_TABLET | Freq: Once | ORAL | Status: AC
  Administered 2017-08-09: 2.5 mg via ORAL
  Filled 2017-08-09: qty 1

## 2017-08-09 MED ORDER — SODIUM CHLORIDE 0.9 % IV SOLN: 250.0000 mL | INTRAVENOUS | Status: DC | PRN

## 2017-08-09 MED ORDER — SODIUM CHLORIDE 0.9% FLUSH
3.0000 mL | Freq: Two times a day (BID) | INTRAVENOUS | Status: DC
  Administered 2017-08-09 – 2017-08-10 (×3): 3 mL via INTRAVENOUS

## 2017-08-09 MED ORDER — TRAMADOL HCL 50 MG PO TABS: 50.0000 mg | ORAL_TABLET | Freq: Four times a day (QID) | ORAL | Status: DC | PRN

## 2017-08-09 MED ORDER — POTASSIUM CHLORIDE CRYS ER 20 MEQ PO TBCR
40.0000 meq | EXTENDED_RELEASE_TABLET | Freq: Two times a day (BID) | ORAL | Status: AC
  Administered 2017-08-09 (×2): 40 meq via ORAL
  Filled 2017-08-09 (×2): qty 2

## 2017-08-09 MED ORDER — DOCUSATE SODIUM 100 MG PO CAPS
200.0000 mg | ORAL_CAPSULE | Freq: Every day | ORAL | Status: DC
  Administered 2017-08-10 – 2017-08-11 (×2): 200 mg via ORAL
  Filled 2017-08-09 (×2): qty 2

## 2017-08-09 MED ORDER — AMIODARONE HCL 200 MG PO TABS
400.0000 mg | ORAL_TABLET | Freq: Two times a day (BID) | ORAL | Status: DC
  Administered 2017-08-09 – 2017-08-11 (×5): 400 mg via ORAL
  Filled 2017-08-09 (×5): qty 2

## 2017-08-09 MED ORDER — MOVING RIGHT ALONG BOOK
Freq: Once | Status: AC
  Administered 2017-08-09: 19:00:00
  Filled 2017-08-09: qty 1

## 2017-08-09 NOTE — Discharge Instructions (Signed)

## 2017-08-09 NOTE — Progress Notes (Signed)
3 Days Post-Op Procedure(s) (LRB): REDO CORONARY ARTERY BYPASS GRAFTING (CABG) TIMES FOUR UTILIZING LEFT GREATER SAPHENOUS VEIN HAVESTED ENDOVASCULARLY, LEFT RADIAL ARTERY.  RIGHT AXILLARY CANNULATION (N/A) RADIAL ARTERY HARVEST (Left) TRANSESOPHAGEAL ECHOCARDIOGRAM (TEE) (N/A) Subjective: No complaints. Slept well overnight.  Had one burst of atrial fib and received a bolus of amiodarone with return to sinus. Ambulated this am. Bowels working.  Objective: Vital signs in last 24 hours:  Temp:  [98.2 F (36.8 C)-99.2 F (37.3 C)] 98.3 F (36.8 C) (03/29 0350) Pulse Rate:  [51-130] 62 (03/29 0700) Cardiac Rhythm: Normal sinus rhythm (03/29 0400) Resp:  [0-28] 22 (03/29 0700) BP: (92-168)/(59-88) 124/71 (03/29 0700) SpO2:  [90 %-99 %] 91 % (03/29 0700) Weight:  [90.6 kg (199 lb 11.8 oz)] 90.6 kg (199 lb 11.8 oz) (03/29 0500)  Hemodynamic parameters for last 24 hours:    Intake/Output from previous day: 03/28 0701 - 03/29 0700 In: 2723.9 [P.O.:240; I.V.:2383.9; IV Piggyback:100] Out: 1245 [Urine:3835] Intake/Output this shift: No intake/output data recorded.  General appearance: alert and cooperative Neurologic: intact Heart: regular rate and rhythm, S1, S2 normal, no murmur, click, rub or gallop Lungs: diminished breath sounds left base Abdomen: soft, non-tender; bowel sounds normal; no masses,  no organomegaly Extremities: edema mild, improved Wound: dressings dry  Lab Results: Recent Labs    08/08/17 0423 08/08/17 1723 08/09/17 0621  WBC 15.2*  --  17.2*  HGB 7.6* 8.5* 8.4*  HCT 23.8* 25.0* 25.3*  PLT 120*  --  138*   BMET:  Recent Labs    08/08/17 0423 08/08/17 1723 08/09/17 0621  NA 134* 134* 134*  K 3.7 4.1 3.6  CL 103 95* 99*  CO2 22  --  27  GLUCOSE 111* 132* 114*  BUN 14 19 20   CREATININE 0.79 0.90 1.13  CALCIUM 7.2*  --  7.9*    PT/INR:  Recent Labs    08/06/17 1841  LABPROT 17.2*  INR 1.41   ABG    Component Value Date/Time   PHART  7.349 (L) 08/07/2017 0156   HCO3 23.3 08/07/2017 0156   TCO2 26 08/08/2017 1723   ACIDBASEDEF 2.0 08/07/2017 0156   O2SAT 93.0 08/07/2017 0156   CBG (last 3)  Recent Labs    08/07/17 1957 08/08/17 0016 08/08/17 0432  GLUCAP 152* 139* 114*   CXR: improved aeration of LLL. Still some atelectasis at left base  Assessment/Plan: S/P Procedure(s) (LRB): REDO CORONARY ARTERY BYPASS GRAFTING (CABG) TIMES FOUR UTILIZING LEFT GREATER SAPHENOUS VEIN HAVESTED ENDOVASCULARLY, LEFT RADIAL ARTERY.  RIGHT AXILLARY CANNULATION (N/A) RADIAL ARTERY HARVEST (Left) TRANSESOPHAGEAL ECHOCARDIOGRAM (TEE) (N/A)  POD 3 Hemodynamically stable in sinus rhythm this am.  Postop atrial fib: converted yesterday to sinus on amio but had a brief burst overnight and received a bolus with return to sinus. Will switch to oral amio today and continue Lopressor. If he has anymore atrial fib will plan to start Coumadin.   Volume excess improving: he has been diuresing well. Weight down 5 lbs since yesterday am. Not sure what admission weight was but he says he is usually 193-195. Will give him another dose of lasix and metolazone this am. Replace K+  Remove foley and central line  Continue IS, ambulation  Transfer to 4E when bed available.  Discussed status and plans with patient and son at bedside.   LOS: 3 days    Gaye Pollack 08/09/2017

## 2017-08-09 NOTE — Discharge Summary (Signed)
Physician Discharge Summary  Patient ID: Eric Beltran MRN: [id number] DOB/AGE: [birth year] 76 y.o.  Admit date: 08/06/2017 Discharge date: 08/11/2017  Admission Diagnoses: Patient Active Problem List   Diagnosis Date Noted  . Coronary artery disease involving coronary bypass graft of native heart with angina pectoris [facility])     Discharge Diagnoses:  Active Problems:   S/P CABG x 4   Discharged Condition: stable  HPI:   The patient is a 77 year old gentleman with hypertension, hyperlipidemia, atrial fibrillation and supraventricular tachycardia, chronic diastolic heart failure, and coronary disease who I had seen initially in June 2013 for a 5.1 cm ascending aortic aneurysm. We decided to continue following this with a repeat scan in 6 months. The patient had had prior history of coronary artery disease treated with stents and was followed by Dr. Verl Beltran. He subsequently underwent coronary bypass graft surgery and replacement of his ascending aorta at [facility] by Dr. Anne Beltran in 2013. He had a left internal mammary graft to the LAD, saphenous vein graft to the ramus, saphenous vein graft to the obtuse marginal, and a saphenous vein graft to the posterior lateral branch. He said that he did well for about 2-1/2 years and then began having some episodic chest discomfort similar to his prior chest pain. Over the past couple months he has had increasing episodes of chest discomfort as well as some exertional fatigue. He was evaluated in October with chest pain and palpitations and has been followed by Dr. Curt Beltran. He presently describes episodes of squeezing substernal chest discomfort with exertion like walking upstairs. He has had no associated shortness of breath. The symptom symptoms are similar to what he had prior to his bypass surgery. His last echocardiogram on 03/06/2017 showed an ejection fraction of 60-65% with grade 2 diastolic dysfunction. There was mild aortic insufficiency and aortic root was  mildly dilated at 4.5 cm. There is moderate concentric left ventricular hypertrophy and severe focal basal septal hypertrophy. He underwent cardiac catheterization by Dr. Claiborne Beltran on 07/11/2017. This showed a left ventricular ejection fraction of 50-55%. There was severe native three-vessel coronary disease with an 80% proximal calcified LAD stenosis in the region of the first diagonal branch with a 90% ostial first diagonal stenosis. The LAD has 60-70% diffuse stenosis after the second diagonal vessel with competitive filling beyond that from the left internal mammary graft. There was total ostial occlusion of left circumflex coronary artery at the prior stenting site. There were collaterals to 2 moderate-sized branches of the left circumflex. There is a large dominant right coronary artery with diffuse calcification. There is a 95% calcific rock-like stenosis in the region just beyond a large posterior descending branch that compromise to large posterior lateral branches. All the previous saphenous vein grafts were occluded at their origin. There is a patent left internal mammary graft to the mid LAD. LVEDP was 22.     Beltran Course:   On 08/06/2017 Eric Beltran underwent a redo CABG x 4 with Dr. Cyndia Beltran. He tolerated the procedure well and was transferred to the surgical ICU. He was extubated in a timely manner. POD 1 he remained hemodynamically stable and in sinus bradycardia. We discontinued his swan Ganz catheter and arterial line. We continued tight glycemic control with SSI and Levemir. We continued Lipitor for dyslipidemia. We discontinued his chest tubes. We started a diuretic regimen for fluid overload. POD 2 he had some issues with his foley overnight but after it was flushed it was working okay. He went into atrial  fibrillation with RVR. We started Amiodarone. He has had some atrial fibrillation in the past. He had some expected acute blood loss anemia and we started iron. We continued to diureses. His  blood glucose level remained controlled. POD 3 he continued to progress. He had one burst of atrial fibrillation which after a bolus of Amiodarone he converted to normal sinus rhythm. We continued to watch is weight decreased as we continued his diuretics. We discontinued his foley catheter and central line. He was stable to transfer to the telemetry unit for continued care. On the floor he continued to progress. He was maintain normal sinus rhythm on oral Amio and metoprolol. He was on room air, ambulating with limited assistance, his incision was healing well and he was ready for discharge home.   Consults: None  Significant Diagnostic Studies:   CLINICAL DATA:  Post CABG, history of ascending aortic aneurysm, CHF, MI, essential benign hypertension, atrial fibrillation  EXAM: PORTABLE CHEST 1 VIEW  COMPARISON:  Portable exam 0553 hours compared to 08/08/2017  FINDINGS: RIGHT jugular catheter tip projects over confluence of the SVC.  Enlargement of cardiac silhouette post CABG.  Bibasilar atelectasis greater on LEFT.  Small LEFT pleural effusion.  LEFT pneumothorax seen on previous exam no longer identified.  No pulmonary infiltrates or acute osseous findings.  IMPRESSION: Enlargement of cardiac silhouette post CABG.  Bibasilar atelectasis LEFT greater than RIGHT with LEFT pleural effusion.  Resolution of previously identified small LEFT pneumothorax.   Electronically Signed   By: Eric Beltran M.D.   On: 08/09/2017 08:07  Treatments:   CARDIOVASCULAR SURGERY OPERATIVE NOTE  08/06/2017  Surgeon:  Eric Pollack, MD  First Assistant: Eric Rough,  PA-C   Preoperative Diagnosis:  Severe native multi-vessel coronary artery disease with occluded vein grafts s/p CABG and replacement of ascending aorta in 2013 at [facility].   Postoperative Diagnosis:  Same   Procedure:  1. RedoMedian Sternotomy 2.   Right axillary artery cannulation 3.    Extracorporeal circulation 3.   Redo Coronary artery bypass grafting x 4   Left radial artery graft to the diagonal  SVG to OM  SVG to PDA  SVG to PL2 4.   Endoscopic vein harvest from the left leg 5.   Left radial artery harvest 6.   Insertion of right femoral arterial line for BP monitoring.   Anesthesia:  General Endotracheal    Discharge Exam: Blood pressure (!) 147/81, pulse 70, temperature 98.3 F (36.8 C), temperature source Oral, resp. rate 20, weight 190 lb (86.2 kg), SpO2 93 %.   General appearance: alert, cooperative and no distress Heart: regular rate and rhythm, S1, S2 normal, no murmur, click, rub or gallop Lungs: clear to auscultation bilaterally Abdomen: soft, non-tender; bowel sounds normal; no masses,  no organomegaly Extremities: extremities normal, atraumatic, no cyanosis or edema Wound: clean and dry    Disposition: Discharge disposition: 01-Home or Self Care       Discharge Instructions    Amb Referral to Cardiac Rehabilitation   Complete by:  As directed    Diagnosis:  CABG   CABG X ___:  4     Allergies as of 08/11/2017   No Known Allergies     Medication List    STOP taking these medications   CARTIA XT 240 MG 24 hr capsule Generic drug:  diltiazem   CIALIS 5 MG tablet Generic drug:  tadalafil   metoprolol succinate 100 MG 24 hr tablet Commonly known as:  TOPROL-XL  naproxen 500 MG tablet Commonly known as:  NAPROSYN   nitroGLYCERIN 0.4 MG SL tablet Commonly known as:  NITROSTAT     TAKE these medications   acetaminophen 325 MG tablet Commonly known as:  TYLENOL Take 2 tablets (650 mg total) by mouth every 6 (six) hours as needed for mild pain.   amiodarone 200 MG tablet Commonly known as:  PACERONE Please take 2 tabs (400mg ) twice a day for 3 days then take 1 tab (200mg ) twice a day until we see you in follow-up.   aspirin 325 MG EC tablet Take 1 tablet (325 mg total) by mouth daily. What changed:     medication strength  how much to take   atorvastatin 40 MG tablet Commonly known as:  LIPITOR TAKE 1 TABLET BY MOUTH AT BEDTIME What changed:    how much to take  how to take this  when to take this   FLOMAX 0.4 MG Caps capsule Generic drug:  tamsulosin Take 0.4 mg by mouth daily.   furosemide 20 MG tablet Commonly known as:  LASIX TAKE 1 TABLET BY MOUTH AS NEEDED FOR LEG SWELLING What changed:  See the new instructions.   isosorbide mononitrate 30 MG 24 hr tablet Commonly known as:  IMDUR Take 1 tablet (30 mg total) by mouth daily.   lisinopril 2.5 MG tablet Commonly known as:  PRINIVIL,ZESTRIL Take 1 tablet (2.5 mg total) by mouth daily.   metoprolol tartrate 25 MG tablet Commonly known as:  LOPRESSOR Take 1 tablet (25 mg total) by mouth 2 (two) times daily.   multivitamin tablet Take 1 tablet by mouth at bedtime.   oxyCODONE 5 MG immediate release tablet Commonly known as:  Oxy IR/ROXICODONE Take 1 tablet (5 mg total) by mouth every 6 (six) hours as needed for severe pain.   valACYclovir 500 MG tablet Commonly known as:  VALTREX Take 500 mg by mouth daily as needed (for outbreaks).      Follow-up Information    Sinda Du, MD. Call in 1 day(s).   Specialty:  Pulmonary Disease Contact information: [place] [address] [phone]        Satira Sark, MD Follow up.   Specialty:  Cardiology Why:  Please call their office and schedule a two week follow-up appointment.  Contact information: [address] [place] [address] [phone]        Eric Pollack, MD Follow up.   Specialty:  Cardiothoracic Surgery Why:  Your appointment is on 09/11/2017 at 10:30am. Please arrive at 10:00am for a chest xray located at [facility] which is on the first floor of our building. Contact information: [place] [address] [phone]        nursing suture removal  appointment Follow up.   Why:  Your suture removal appointment is on 08/19/2017 at 10:00am with the nursing staff.  Contact information: Dr. Vivi Martens office         The patient has been discharged on:   1.Beta Blocker:  Yes [ x  ]                              No   [   ]                              If No, reason:  2.Ace Inhibitor/ARB: Yes [ x  ]                                     No  [    ]                                     If No, reason:  3.Statin:   Yes [ x  ]                  No  [   ]                  If No, reason:  4.Ecasa:  Yes  [ x  ]                  No   [   ]                  If No, reason:    Signed: Elgie Collard 08/11/2017, 7:49 AM

## 2017-08-10 LAB — BPAM RBC
BLOOD PRODUCT EXPIRATION DATE: 201904062359
BLOOD PRODUCT EXPIRATION DATE: 201904062359
BLOOD PRODUCT EXPIRATION DATE: 201904222359
Blood Product Expiration Date: 201904222359
ISSUE DATE / TIME: 201903260724
ISSUE DATE / TIME: 201903260724
UNIT TYPE AND RH: 6200
UNIT TYPE AND RH: 6200
Unit Type and Rh: 6200
Unit Type and Rh: 6200

## 2017-08-10 LAB — TYPE AND SCREEN
ABO/RH(D): A POS
ANTIBODY SCREEN: NEGATIVE
UNIT DIVISION: 0
UNIT DIVISION: 0
Unit division: 0
Unit division: 0

## 2017-08-10 MED ORDER — POTASSIUM CHLORIDE 20 MEQ PO PACK
20.0000 meq | PACK | Freq: Two times a day (BID) | ORAL | Status: AC
  Administered 2017-08-10: 20 meq via ORAL
  Filled 2017-08-10 (×2): qty 1

## 2017-08-10 MED ORDER — FUROSEMIDE 10 MG/ML IJ SOLN
40.0000 mg | Freq: Once | INTRAMUSCULAR | Status: AC
  Administered 2017-08-10: 40 mg via INTRAVENOUS
  Filled 2017-08-10: qty 4

## 2017-08-10 NOTE — Progress Notes (Signed)
[id number] Education reviewed with patient and daughter regarding exercise instructions, sternal precautions and heart healthy diet. Eric Beltran says he is interested in participating in phase 2 cardiac rehab at [facility]. Eric Russey has already walked twice today and knows to walk again this evening.Barnet Pall, RN,BSN 08/10/2017 3:49 PM

## 2017-08-10 NOTE — Progress Notes (Signed)
4 Days Post-Op Procedure(s) (LRB): REDO CORONARY ARTERY BYPASS GRAFTING (CABG) TIMES FOUR UTILIZING LEFT GREATER SAPHENOUS VEIN HAVESTED ENDOVASCULARLY, LEFT RADIAL ARTERY.  RIGHT AXILLARY CANNULATION (N/A) RADIAL ARTERY HARVEST (Left) TRANSESOPHAGEAL ECHOCARDIOGRAM (TEE) (N/A) Subjective: Doing well on room air with sinus rhythm We will remove epicardial pacing wires today Plan discharge home tomorrow Discharge instructions reviewed with patient  Objective: Vital signs in last 24 hours: Temp:  [98.4 F (36.9 C)-99.6 F (37.6 C)] 99.5 F (37.5 C) (03/30 0856) Pulse Rate:  [60-78] 70 (03/30 0600) Cardiac Rhythm: Normal sinus rhythm (03/30 0800) Resp:  [16-27] 20 (03/30 0800) BP: (99-142)/(26-79) 116/58 (03/30 0800) SpO2:  [89 %-95 %] 89 % (03/30 0600) Weight:  [194 lb 0.1 oz (88 kg)] 194 lb 0.1 oz (88 kg) (03/30 0500)  Hemodynamic parameters for last 24 hours:    Intake/Output from previous day: 03/29 0701 - 03/30 0700 In: 953.5 [P.O.:720; I.V.:233.5] Out: 3200 [Urine:3200] Intake/Output this shift: Total I/O In: 3 [I.V.:3] Out: -        Exam    General- alert and comfortable    Neck- no JVD, no cervical adenopathy palpable, no carotid bruit   Lungs- clear without rales, wheezes   Cor- regular rate and rhythm, no murmur , gallop   Abdomen- soft, non-tender   Extremities - warm, non-tender, minimal edema   Neuro- oriented, appropriate, no focal weakness   Lab Results: Recent Labs    08/08/17 0423 08/08/17 1723 08/09/17 0621  WBC 15.2*  --  17.2*  HGB 7.6* 8.5* 8.4*  HCT 23.8* 25.0* 25.3*  PLT 120*  --  138*   BMET:  Recent Labs    08/08/17 0423 08/08/17 1723 08/09/17 0621  NA 134* 134* 134*  K 3.7 4.1 3.6  CL 103 95* 99*  CO2 22  --  27  GLUCOSE 111* 132* 114*  BUN 14 19 20   CREATININE 0.79 0.90 1.13  CALCIUM 7.2*  --  7.9*    PT/INR: No results for input(s): LABPROT, INR in the last 72 hours. ABG    Component Value Date/Time   PHART 7.349  (L) 08/07/2017 0156   HCO3 23.3 08/07/2017 0156   TCO2 26 08/08/2017 1723   ACIDBASEDEF 2.0 08/07/2017 0156   O2SAT 93.0 08/07/2017 0156   CBG (last 3)  Recent Labs    08/07/17 1957 08/08/17 0016 08/08/17 0432  GLUCAP 152* 139* 114*    Assessment/Plan: S/P Procedure(s) (LRB): REDO CORONARY ARTERY BYPASS GRAFTING (CABG) TIMES FOUR UTILIZING LEFT GREATER SAPHENOUS VEIN HAVESTED ENDOVASCULARLY, LEFT RADIAL ARTERY.  RIGHT AXILLARY CANNULATION (N/A) RADIAL ARTERY HARVEST (Left) TRANSESOPHAGEAL ECHOCARDIOGRAM (TEE) (N/A) Mobilize Diuresis Diabetes control d/c pacing wires   LOS: 4 days    Tharon Aquas Trigt III 08/10/2017

## 2017-08-10 NOTE — Progress Notes (Signed)
08/10/2017 1510 Received pt to room 4E-04 from [facility].  Pt is A&O, no C/O voiced.  Tele monitor applied and CCMD notified.  CHG completed.  Oriented to room, call light and bed.  Call bell in reach. [name]

## 2017-08-10 NOTE — Progress Notes (Signed)
Patient was transferred to 4E04.  Patient remained hemodynamically stable, tolerated ambulation and pacer wire removal well during shift.  Patient was transferred with iPad, cell phone and charger for both electronics provided to patients daughter whom left with patient. Please see flowsheet for further detailed data.

## 2017-08-11 MED ORDER — AMIODARONE HCL 200 MG PO TABS: ORAL_TABLET | ORAL | 1 refills | Status: DC

## 2017-08-11 MED ORDER — ACETAMINOPHEN 325 MG PO TABS: 650.0000 mg | ORAL_TABLET | Freq: Four times a day (QID) | ORAL | Status: DC | PRN

## 2017-08-11 MED ORDER — ISOSORBIDE MONONITRATE ER 30 MG PO TB24: 30.0000 mg | ORAL_TABLET | Freq: Every day | ORAL | 1 refills | Status: DC

## 2017-08-11 MED ORDER — METOPROLOL TARTRATE 25 MG PO TABS: 25.0000 mg | ORAL_TABLET | Freq: Two times a day (BID) | ORAL | 1 refills | Status: DC

## 2017-08-11 MED ORDER — OXYCODONE HCL 5 MG PO TABS: 5.0000 mg | ORAL_TABLET | Freq: Four times a day (QID) | ORAL | 0 refills | Status: DC | PRN

## 2017-08-11 MED ORDER — LISINOPRIL 2.5 MG PO TABS
2.5000 mg | ORAL_TABLET | Freq: Every day | ORAL | Status: DC
  Administered 2017-08-11: 2.5 mg via ORAL
  Filled 2017-08-11: qty 1

## 2017-08-11 MED ORDER — LISINOPRIL 2.5 MG PO TABS: 2.5000 mg | ORAL_TABLET | Freq: Every day | ORAL | 1 refills | Status: DC

## 2017-08-11 MED ORDER — ASPIRIN 325 MG PO TBEC: 325.0000 mg | DELAYED_RELEASE_TABLET | Freq: Every day | ORAL | 0 refills | Status: DC

## 2017-08-11 NOTE — Progress Notes (Signed)
      [address]       [place] [address]             [phone]      5 Days Post-Op Procedure(s) (LRB): REDO CORONARY ARTERY BYPASS GRAFTING (CABG) TIMES FOUR UTILIZING LEFT GREATER SAPHENOUS VEIN HAVESTED ENDOVASCULARLY, LEFT RADIAL ARTERY.  RIGHT AXILLARY CANNULATION (N/A) RADIAL ARTERY HARVEST (Left) TRANSESOPHAGEAL ECHOCARDIOGRAM (TEE) (N/A) Subjective: Feels goods this morning. No issues overnight.   Objective: Vital signs in last 24 hours: Temp:  [98.3 F (36.8 C)-99.5 F (37.5 C)] 98.3 F (36.8 C) (03/31 0456) Pulse Rate:  [70-84] 70 (03/31 0456) Cardiac Rhythm: Normal sinus rhythm (03/31 0700) Resp:  [12-26] 20 (03/31 0456) BP: (113-158)/(58-87) 147/81 (03/31 0456) SpO2:  [93 %-98 %] 93 % (03/31 0456) Weight:  [190 lb (86.2 kg)] 190 lb (86.2 kg) (03/31 0456)     Intake/Output from previous day: 03/30 0701 - 03/31 0700 In: 678 [P.O.:675; I.V.:3] Out: -  Intake/Output this shift: No intake/output data recorded.  General appearance: alert, cooperative and no distress Heart: regular rate and rhythm, S1, S2 normal, no murmur, click, rub or gallop Lungs: clear to auscultation bilaterally Abdomen: soft, non-tender; bowel sounds normal; no masses,  no organomegaly Extremities: extremities normal, atraumatic, no cyanosis or edema Wound: clean and dry  Lab Results: Recent Labs    08/08/17 1723 08/09/17 0621  WBC  --  17.2*  HGB 8.5* 8.4*  HCT 25.0* 25.3*  PLT  --  138*   BMET:  Recent Labs    08/08/17 1723 08/09/17 0621  NA 134* 134*  K 4.1 3.6  CL 95* 99*  CO2  --  27  GLUCOSE 132* 114*  BUN 19 20  CREATININE 0.90 1.13  CALCIUM  --  7.9*    PT/INR: No results for input(s): LABPROT, INR in the last 72 hours. ABG    Component Value Date/Time   PHART 7.349 (L) 08/07/2017 0156   HCO3 23.3 08/07/2017 0156   TCO2 26 08/08/2017 1723   ACIDBASEDEF 2.0 08/07/2017 0156   O2SAT 93.0 08/07/2017 0156   CBG (last 3)  No results for  input(s): GLUCAP in the last 72 hours.  Assessment/Plan: S/P Procedure(s) (LRB): REDO CORONARY ARTERY BYPASS GRAFTING (CABG) TIMES FOUR UTILIZING LEFT GREATER SAPHENOUS VEIN HAVESTED ENDOVASCULARLY, LEFT RADIAL ARTERY.  RIGHT AXILLARY CANNULATION (N/A) RADIAL ARTERY HARVEST (Left) TRANSESOPHAGEAL ECHOCARDIOGRAM (TEE) (N/A)  1. CV-BP climbing, will start low-dose ACEI. Continue Metoprolol 25mg  BID. EPW are out 2. Pulm-tolerating room air without issue 3. Renal-last creatinine 1.1. Potassium has been replaced.  4. H and H stable 5. Blood glucose level well controlled on current regimen  Plan: Discharge home today with family.     LOS: 5 days    Eric Beltran 08/11/2017

## 2017-08-11 NOTE — Care Management Note (Signed)
Case Management Note Marvetta Gibbons RN, BSN Unit 4E-Case Manager [phone]  Patient Details  Name: Eric Beltran MRN: [id number] Date of Birth: [birth year]  Subjective/Objective:   Pt admitted s/p CABGx4                 Action/Plan: PTA pt lived at home daughter available to assist at discharge- No CM needs noted for transition home.   Expected Discharge Date:  08/11/17               Expected Discharge Plan:  Home/Self Care  In-House Referral:  NA  Discharge planning Services  CM Consult  Post Acute Care Choice:  NA Choice offered to:  NA  DME Arranged:    DME Agency:     HH Arranged:    HH Agency:     Status of Service:  Completed, signed off  If discussed at [place] of Stay Meetings, dates discussed:   Discharge Disposition: home/self care    Additional Comments:  Dawayne Patricia, RN 08/11/2017, 11:11 AM

## 2017-08-11 NOTE — Progress Notes (Signed)
08/11/2017 1215 Discharge AVS meds taken today and those due this evening reviewed.  Follow-up appointments and when to call md reviewed.  D/C IV and TELE.  Questions and concerns addressed.   D/C home per orders. [name]

## 2017-08-19 ENCOUNTER — Ambulatory Visit (INDEPENDENT_AMBULATORY_CARE_PROVIDER_SITE_OTHER): Payer: Self-pay

## 2017-08-19 ENCOUNTER — Other Ambulatory Visit: Payer: Self-pay

## 2017-08-19 DIAGNOSIS — Z4802 Encounter for removal of sutures: Secondary | ICD-10-CM

## 2017-08-19 NOTE — Progress Notes (Signed)
Removed 3 sutures from chest tube incision sites with no signs of infections and patient tolerated well.

## 2017-08-23 ENCOUNTER — Ambulatory Visit: Payer: Medicare Other | Admitting: Cardiology

## 2017-08-28 NOTE — Progress Notes (Signed)
Cardiology Office Note  Date: 08/30/2017   ID: Eric Beltran, DOB 01-11-1941, MRN [id number]  PCP: Sinda Du, MD  Primary Cardiologist: Rozann Lesches, MD   Chief Complaint  Patient presents with  . Coronary Artery Disease    History of Present Illness: Eric Beltran is a 77 y.o. male last seen in August 2018.  I reviewed extensive interval records.  He was assessed by Dr. Curt Bears in November 2018 for palpitations and suspected PSVT with subsequent increase in beta-blocker and improvement in symptoms.  At follow-up in February he was reporting worsening angina symptoms and referred for a cardiac catheterization.  He was found to have multivessel CAD with occlusion of 3 previously placed vein grafts and patent LIMA to the mid LAD.  He was seen by Dr. Cyndia Bent and ultimately underwent redo CABG on March 26 (left radial to diagonal, SVG to OM, SVG to PDA, and SVG to PL2).  Postoperative course was complicated by atrial fibrillation with RVR and he was placed on amiodarone.  He was taken off Cartia XT at discharge and also placed on low-dose beta-blocker.  Cardiac catheterization and subsequent TEE reports are outlined below.  He presents today stating that he has been doing reasonably well.  He stayed with family members the first week or so after hospital discharge but is back at home now.  He reports no major postsurgical thoracic pain.  He has had left leg swelling despite use of Lasix 20 mg daily.  No orthopnea or PND.  No fevers or chills.  I personally reviewed his ECG today which shows sinus rhythm with prolonged PR interval and nonspecific T wave changes.  I went over his medications which are outlined below and significantly different from the last time I saw him.  Blood pressure and heart rate are well controlled today.  Past Medical History:  Diagnosis Date  . Arthritis   . Ascending aortic aneurysm [facility])    Status post repair 12/2011 at [facility]  . BPH (benign prostatic  hyperplasia)   . Chronic diastolic CHF (congestive heart failure) ([place])   . Coronary atherosclerosis of native coronary artery    a. Multivessel status post prior stenting and ultimately CABG 12/2011 at [facility] with with LIMA to LAD, SVG to PLB, SVG to ramus, SVG to OM . b. Last cath 06/2014 (following ischemic nuc) -> medical therapy, no feasible way to revascularize LCx territory.  . Essential hypertension   . Mixed hyperlipidemia   . Myocardial infarction ([place])   . Postoperative atrial fibrillation ([place])   . Supraventricular tachycardia [facility])     Past Surgical History:  Procedure Laterality Date  . ASCENDING AORTIC ANEURYSM REPAIR  12/2011 [facility]   26 mm.Dacron graft  . CORONARY ARTERY BYPASS GRAFT  12/2011 [facility]   LIMA to LAD, SVG to PLB, SVG to ramus, SVG to OM  . CORONARY ARTERY BYPASS GRAFT N/A 08/06/2017   Procedure: REDO CORONARY ARTERY BYPASS GRAFTING (CABG) TIMES FOUR UTILIZING LEFT GREATER SAPHENOUS VEIN HAVESTED ENDOVASCULARLY, LEFT RADIAL ARTERY.  RIGHT AXILLARY CANNULATION;  Surgeon: Gaye Pollack, MD;  Location: [place];  Service: Open Heart Surgery;  Laterality: N/A;  . HERNIA REPAIR    . LEFT HEART CATH AND CORS/GRAFTS ANGIOGRAPHY N/A 07/11/2017   Procedure: LEFT HEART CATH AND CORS/GRAFTS ANGIOGRAPHY;  Surgeon: Troy Sine, MD;  Location: [place] CV LAB;  Service: Cardiovascular;  Laterality: N/A;  . LEFT HEART CATHETERIZATION WITH CORONARY ANGIOGRAM N/A 06/18/2014   Procedure: LEFT HEART  CATHETERIZATION WITH CORONARY ANGIOGRAM;  Surgeon: Blane Ohara, MD;  Location: [facility] CATH LAB;  Service: Cardiovascular;  Laterality: N/A;  . RADIAL ARTERY HARVEST Left 08/06/2017   Procedure: RADIAL ARTERY HARVEST;  Surgeon: Gaye Pollack, MD;  Location: [place];  Service: Open Heart Surgery;  Laterality: Left;  . TEE WITHOUT CARDIOVERSION N/A 08/06/2017   Procedure: TRANSESOPHAGEAL ECHOCARDIOGRAM (TEE);  Surgeon: Gaye Pollack, MD;  Location: [place];  Service: Open Heart Surgery;   Laterality: N/A;  . TOTAL HIP ARTHROPLASTY Right 02/07/2016   Procedure: RIGHT TOTAL HIP ARTHROPLASTY ANTERIOR APPROACH;  Surgeon: Mcarthur Rossetti, MD;  Location: [place];  Service: Orthopedics;  Laterality: Right;  . TRANSURETHRAL RESECTION OF PROSTATE      Current Outpatient Medications  Medication Sig Dispense Refill  . amiodarone (PACERONE) 200 MG tablet Take 200 mg by mouth 2 (two) times daily.    [name] aspirin EC 325 MG EC tablet Take 1 tablet (325 mg total) by mouth daily. 30 tablet 0  . atorvastatin (LIPITOR) 40 MG tablet TAKE 1 TABLET BY MOUTH AT BEDTIME (Patient taking differently: TAKE 40 MG BY MOUTH AT BEDTIME) 90 tablet 6  . furosemide (LASIX) 20 MG tablet Take 2 tablets (40 mg total) by mouth daily. 180 tablet 3  . isosorbide mononitrate (IMDUR) 30 MG 24 hr tablet Take 1 tablet (30 mg total) by mouth daily. 30 tablet 1  . lisinopril (PRINIVIL,ZESTRIL) 2.5 MG tablet Take 1 tablet (2.5 mg total) by mouth daily. 30 tablet 1  . metoprolol tartrate (LOPRESSOR) 25 MG tablet Take 1 tablet (25 mg total) by mouth 2 (two) times daily. 60 tablet 1  . Multiple Vitamin (MULTIVITAMIN) tablet Take 1 tablet by mouth at bedtime.     . Tamsulosin HCl (FLOMAX) 0.4 MG CAPS Take 0.4 mg by mouth daily.     . valACYclovir (VALTREX) 500 MG tablet Take 500 mg by mouth daily as needed (for outbreaks).   4   No current facility-administered medications for this visit.    Allergies:  Patient has no known allergies.   Social History: The patient  reports that he has never smoked. He has never used smokeless tobacco. He reports that he drinks alcohol. He reports that he does not use drugs.   Family History: Family History  Problem Relation Age of Onset  . Hypertension Father   . Parkinson's disease Father   . Hypertension Mother   . Lung cancer Sister    ROS:  Please see the history of present illness. Otherwise, complete review of systems is positive for left leg swelling.  All other systems are  reviewed and negative.   Physical Exam: VS:  BP 125/65   Pulse 60   Ht 5\' 7"  (3.825 m)   Wt 193 lb (87.5 kg)   SpO2 94%   BMI 30.23 kg/m , BMI Body mass index is 30.23 kg/m.  Wt Readings from Last 3 Encounters:  08/30/17 193 lb (87.5 kg)  08/11/17 190 lb (86.2 kg)  08/05/17 199 lb 1.6 oz (90.3 kg)    General: Patient appears comfortable at rest. HEENT: Conjunctiva and lids normal, oropharynx clear. Neck: Supple, no elevated JVP or carotid bruits, no thyromegaly. Lungs: Clear to auscultation, nonlabored breathing at rest. Thorax: Well-healed sternal incision. Cardiac: Regular rate and rhythm, no S3, 2/6 systolic murmur, no pericardial rub. Abdomen: Soft, nontender, bowel sounds present,. Extremities: 2+ lower left leg edema, distal pulses 1-2+. Skin: Warm and dry. Musculoskeletal: No kyphosis. Neuropsychiatric: Alert and oriented  x3, affect grossly appropriate.  ECG: I personally reviewed the tracing from 08/07/2017 which showed sinus bradycardia with old inferior infarct pattern.  Recent Labwork: 03/05/2017: B Natriuretic Peptide 318.0; TSH 1.170 08/05/2017: ALT 20; AST 21 08/07/2017: Magnesium 2.4 08/09/2017: BUN 20; Creatinine, Ser 1.13; Hemoglobin 8.4; Platelets 138; Potassium 3.6; Sodium 134     Component Value Date/Time   CHOL 130 03/06/2017 0610   TRIG 262 (H) 03/06/2017 0610   HDL 28 (L) 03/06/2017 0610   CHOLHDL 4.6 03/06/2017 0610   VLDL 52 (H) 03/06/2017 0610   LDLCALC 50 03/06/2017 0610    Other Studies Reviewed Today:  Cardiac catheterization 07/11/2017:  Prox RCA to Mid RCA lesion is 30% stenosed.  Mid RCA lesion is 20% stenosed.  Post Atrio lesion is 95% stenosed.  Ost Cx to Prox Cx lesion is 100% stenosed.  Ost 1st Diag lesion is 90% stenosed.  Prox LAD-1 lesion is 80% stenosed.  Prox LAD-2 lesion is 70% stenosed.  The left ventricular ejection fraction is 50-55% by visual estimate.  The left ventricular systolic function is normal.  LV  end diastolic pressure is mildly elevated.   Normal global LV function with ejection fraction at 50-55%.  Severe native CAD with 80% proximal calcified LAD stenosis in the region of the first diagonal vessel with 90% ostial first diagonal stenosis and 60-70% diffuse LAD stenosis after the second diagonal vessel with competitive filling beyond this from the LIMA graft; total ostial occlusion of the left circumflex coronary artery at site of prior stenting.  There is collateralization to 2 branches of the circumflex via the left injection.  Large, very dominant RCA with diffuse calcification with 30% narrowing in the mid segment, a patent stent proximal to the acute margin with 20% intimal aplasia, and apparent significant calcific rocklike segment in the region just beyond the very large PDA takeoff and prior to to large inferior LV and posterior lateral branches.  Occlusion of the previous 3 saphenous vein conduits at its origin in the aorta.  Patent LIMA graft supplying the mid LAD.  RECOMMENDATION: Angiographic findings were reviewed with Dr. Burt Knack.  It appears that the 90% very calcified "rock "in the dista RCA jeopardizes a very large distal RCA system.  This may not be amenable to atherectomy and and would be very difficult to stent which would also potentially jail these large branch vessels.  With his significant concomitant disease with total occlusion of the circumflex and collateralized large branches that are good surgical targets and in addition to his proximal LAD and diagonal vessel stenosis outpatient surgical consultation will be initially obtained for consideration of redo CABG revascularization.  Isosorbide will be added to his medical regimen.  TEE 08/06/2017: Left Ventricle Normal cavity size and wall thickness. LV systolic function is low normal with an EF of 50-55%. There are no obvious wall motion abnormalities. No thrombus present. No mass present.  Septum Normal atrial  and ventricular septum with no septal defect and normal septal motion. No Patent Foramen Ovale present.  Left Atrium Left atrium normal in structure and function. No thrombus present. No left atrial appendage. No mass present. No ASD or PFO closure device in interatrial septum.  Aortic Valve The valve is trileaflet. Normal leaflet separation. No stenosis. Mild to moderate regurgitation. No AV vegetation.  Aorta No aneurysm present. Graft present in the ascending aorta. No aortic dissection present  Mitral Valve Normal valve structure. No leaflet thickening and calcification present. No stenosis. Normal leaflet mobility. Trace regurgitation.  The jet direction is centrally-directed. No prolapse present. No vegetation present. No abscess present. No flail present.  Right Ventricle Normal cavity size, wall thickness and ejection fraction.  Right Atrium Normal right atrial size.  Tricuspid Valve Normal tricuspid valve structure. No stenosis. No regurgitation.  Pulmonic Valve Normal pulmonic valve structure. No prosthetic valve present. Trace regurgitation.   Assessment and Plan:  1.  Multivessel CAD that is post CABG in 2013 with recently documented progressive graft disease in the setting of accelerating angina.  He is now status post redo CABG by Dr. Cyndia Bent with left radial to the diagonal, SVG to OM, SVG to PDA, and SVG to PL2.  LVEF 50-55% by perioperative TEE.  He has left leg edema following vein harvest, Lasix will be increased to 40 mg daily for now.  He has follow-up with Dr. Cyndia Bent pending soon.  Eventually anticipate cardiac rehabilitation.  2.  Postoperative atrial fibrillation, maintaining sinus rhythm by follow-up ECG.  We will continue amiodarone for now.  We will consider stopping around the time of his next visit.  He was not anticoagulated at hospital discharge, and I will hold off on initiating anticoagulation unless he has recurring atrial fibrillation.  3.  Known history of PSVT.  He  was previously on combination of high-dose beta-blocker and calcium channel blocker.  Now just on low-dose Lopressor.  Heart rate in the 60s.  This will need to be followed in case he needs additional adjustments.  He has also seen Dr. Curt Bears for EP evaluation.  4.  Mixed hyperlipidemia, on Lipitor.  Last LDL 50.  5.  Essential hypertension, blood pressure is well controlled today.  Current medicines were reviewed with the patient today.   Orders Placed This Encounter  Procedures  . Basic metabolic panel  . EKG 12-Lead    Disposition: Follow-up in 1 month.  Signed, Satira Sark, MD, [facility] 08/30/2017 3:36 PM    [facility] [place], [place], [place] [address] Phone: [phone]; Fax: [phone]

## 2017-08-30 ENCOUNTER — Ambulatory Visit: Payer: Medicare Other | Admitting: Cardiology

## 2017-08-30 ENCOUNTER — Encounter: Payer: Self-pay | Admitting: Cardiology

## 2017-08-30 VITALS — BP 125/65 | HR 60 | Ht 67.0 in | Wt 193.0 lb

## 2017-08-30 DIAGNOSIS — I25119 Atherosclerotic heart disease of native coronary artery with unspecified angina pectoris: Secondary | ICD-10-CM | POA: Diagnosis not present

## 2017-08-30 DIAGNOSIS — I10 Essential (primary) hypertension: Secondary | ICD-10-CM | POA: Diagnosis not present

## 2017-08-30 DIAGNOSIS — E782 Mixed hyperlipidemia: Secondary | ICD-10-CM

## 2017-08-30 DIAGNOSIS — I4891 Unspecified atrial fibrillation: Secondary | ICD-10-CM | POA: Diagnosis not present

## 2017-08-30 DIAGNOSIS — I9789 Other postprocedural complications and disorders of the circulatory system, not elsewhere classified: Secondary | ICD-10-CM

## 2017-08-30 DIAGNOSIS — I471 Supraventricular tachycardia: Secondary | ICD-10-CM

## 2017-08-30 MED ORDER — FUROSEMIDE 20 MG PO TABS: 40.0000 mg | ORAL_TABLET | Freq: Every day | ORAL | 3 refills | Status: DC

## 2017-08-30 NOTE — Patient Instructions (Addendum)
Medication Instructions:   Your physician has recommended you make the following change in your medication:   Increase furosemide to 40 mg by mouth daily. Please take (2) of your 20 mg tablets daily.  Continue all other medications the same.  Labwork:  Your physician recommends that you schedule a follow-up appointment in: 1 month just before your next visit to check your BMET.  Testing/Procedures:  NONE  Follow-Up:  Your physician recommends that you schedule a follow-up appointment in: 1 month.  Any Other Special Instructions Will Be Listed Below (If Applicable).  If you need a refill on your cardiac medications before your next appointment, please call your pharmacy.

## 2017-09-10 ENCOUNTER — Other Ambulatory Visit: Payer: Self-pay | Admitting: Surgery

## 2017-09-10 DIAGNOSIS — Z951 Presence of aortocoronary bypass graft: Secondary | ICD-10-CM

## 2017-09-11 ENCOUNTER — Ambulatory Visit
Admission: RE | Admit: 2017-09-11 | Discharge: 2017-09-11 | Disposition: A | Discharge status: Home / Self Care | Payer: Medicare Other | Source: Ambulatory Visit | Attending: Surgery | Admitting: Surgery

## 2017-09-11 ENCOUNTER — Ambulatory Visit (INDEPENDENT_AMBULATORY_CARE_PROVIDER_SITE_OTHER): Payer: Self-pay | Admitting: Surgery

## 2017-09-11 ENCOUNTER — Encounter: Payer: Self-pay | Admitting: Surgery

## 2017-09-11 VITALS — BP 180/82 | HR 62 | Resp 20 | Ht 67.0 in | Wt 192.6 lb

## 2017-09-11 DIAGNOSIS — Z951 Presence of aortocoronary bypass graft: Secondary | ICD-10-CM | POA: Diagnosis not present

## 2017-09-11 DIAGNOSIS — I2510 Atherosclerotic heart disease of native coronary artery without angina pectoris: Secondary | ICD-10-CM

## 2017-09-11 NOTE — Progress Notes (Signed)
HPI: Patient returns for routine postoperative follow-up having undergone redo coronary artery bypass graft surgery x4 using a left radial artery graft and 3 vein grafts on 08/06/2017. The patient's early postoperative recovery while in the hospital was notable for development of postoperative atrial fibrillation converted with amiodarone.  He maintained sinus rhythm after that and did not need anticoagulation. Since hospital discharge the patient reports that he has been feeling well overall.  He is walking short distances daily without chest pain or shortness of breath.  His daughter is with him today she does not feel like he is walking enough.  He has seen Dr. Domenic Polite in postoperative follow-up and was noted to have persistent edema in the left leg related to harvesting the saphenous vein.  His Lasix was increased to 40 mg daily and this has improved the edema.  He also reports some mild numbness and tingling in the left fourth and fifth fingers since surgery.   Current Outpatient Medications  Medication Sig Dispense Refill  . amiodarone (PACERONE) 200 MG tablet Take 200 mg by mouth 2 (two) times daily.    [name] aspirin EC 325 MG EC tablet Take 1 tablet (325 mg total) by mouth daily. 30 tablet 0  . atorvastatin (LIPITOR) 40 MG tablet TAKE 1 TABLET BY MOUTH AT BEDTIME (Patient taking differently: TAKE 40 MG BY MOUTH AT BEDTIME) 90 tablet 6  . furosemide (LASIX) 20 MG tablet Take 2 tablets (40 mg total) by mouth daily. 180 tablet 3  . isosorbide mononitrate (IMDUR) 30 MG 24 hr tablet Take 1 tablet (30 mg total) by mouth daily. 30 tablet 1  . lisinopril (PRINIVIL,ZESTRIL) 2.5 MG tablet Take 1 tablet (2.5 mg total) by mouth daily. 30 tablet 1  . metoprolol tartrate (LOPRESSOR) 25 MG tablet Take 1 tablet (25 mg total) by mouth 2 (two) times daily. 60 tablet 1  . Multiple Vitamin (MULTIVITAMIN) tablet Take 1 tablet by mouth at bedtime.     . Tamsulosin HCl (FLOMAX) 0.4 MG CAPS Take 0.4 mg by mouth  daily.     . valACYclovir (VALTREX) 500 MG tablet Take 500 mg by mouth daily as needed (for outbreaks).   4   No current facility-administered medications for this visit.     Physical Exam: BP (!) 180/82   Pulse 62   Resp 20   Ht 5\' 7"  (1.702 m)   Wt 192 lb 9.6 oz (87.4 kg)   SpO2 99% Comment: RA  BMI 30.17 kg/m  He looks well. Cardiac exam shows a regular rate and rhythm with normal heart sounds. Lung exam is clear. Chest incision is healing well and sternum is stable. The left arm incision is healing well and the left hand is neurovascularly intact. The left leg incision is healing well.  There is mild bilateral ankle edema.  Diagnostic Tests:  CLINICAL DATA:  Recent coronary artery bypass grafting  EXAM: CHEST - 2 VIEW  COMPARISON:  August 09, 2017  FINDINGS: There is atelectatic change in the left mid lower lung zones. There is no edema or consolidation. Heart is mildly enlarged with pulmonary vascularity within normal limits. Patient is status post coronary artery bypass grafting. There is aortic atherosclerosis. No adenopathy. There are surgical clips overlying the upper right chest. There is degenerative change in thoracic spine.  IMPRESSION: Areas of postoperative change. No pneumothorax. Areas of atelectatic change on the left. No edema or consolidation. Stable cardiac prominence. There is aortic atherosclerosis.  Aortic Atherosclerosis (ICD10-I70.0).  Electronically Signed   By: Lowella Grip III M.D.   On: 09/11/2017 10:28  Impression:  Overall I think Mr. Berling is making a good recovery following his surgery.  His preoperative symptoms of exertional chest discomfort and fatigue have resolved.  I encouraged him to get out walking more and to try to reach a goal of 20 to 30 minutes daily.  Also encouraged him to start cardiac rehab.  I think he has recovered adequately to start that anytime.  I told him he can return to driving a car, which he  has already done, but he should not lift anything heavier than 10 pounds for 3 months postoperatively.  He appears to be maintaining sinus rhythm since discharge and Dr. Domenic Polite is planning to discontinue the amiodarone after his next visit.  He is hypertensive today but so that he did not take any of his medications this morning.  When his blood pressure was checked 2 weeks ago by Dr. Domenic Polite it was 125/65 so I will not make any changes to his blood pressure medications at this time.  I asked him to take his medications as soon as he gets home.  I discussed the importance of medication compliance.  Also discussed the importance of staying on a heart healthy diet.  I told him that he can discontinue the Imdur after his current prescription is complete since this was only being used to prevent radial artery spasm early postoperatively.  Plan:  He will continue to follow-up with Dr. Domenic Polite and I will plan to see him back in about 2 months.   Gaye Pollack, MD [facility] [phone]

## 2017-09-12 ENCOUNTER — Other Ambulatory Visit: Payer: Self-pay | Admitting: *Deleted

## 2017-09-12 MED ORDER — ATORVASTATIN CALCIUM 40 MG PO TABS: 40.0000 mg | ORAL_TABLET | Freq: Every day | ORAL | 1 refills | Status: DC

## 2017-09-17 ENCOUNTER — Other Ambulatory Visit: Payer: Self-pay | Admitting: Physician Assistant

## 2017-09-25 ENCOUNTER — Ambulatory Visit: Admit: 2017-09-25 | Discharge: 2017-09-25 | Payer: MEDICARE

## 2017-09-25 ENCOUNTER — Ambulatory Visit
Admit: 2017-09-25 | Discharge: 2017-09-25 | Payer: MEDICARE | Attending: Nurse Practitioner | Primary: Nurse Practitioner

## 2017-09-25 DIAGNOSIS — R310 Gross hematuria: Secondary | ICD-10-CM | POA: Diagnosis not present

## 2017-09-25 DIAGNOSIS — I861 Scrotal varices: Secondary | ICD-10-CM | POA: Diagnosis not present

## 2017-09-25 DIAGNOSIS — N5082 Scrotal pain: Secondary | ICD-10-CM

## 2017-09-27 MED ORDER — NITROFURANTOIN MONOHYDRATE/MACROCRYSTALS 100 MG CAPSULE
ORAL_CAPSULE | Freq: Two times a day (BID) | ORAL | 0 refills | 0.00000 days | Status: CP
Start: 2017-09-27 — End: 2017-10-02

## 2017-10-14 NOTE — Progress Notes (Signed)
Cardiology Office Note  Date: 10/15/2017   ID: ROMULO Beltran, DOB 01-Nov-1940, MRN [id number]  PCP: Sinda Du, MD  Primary Cardiologist: Rozann Lesches, MD -  Chief Complaint  Patient presents with  . Coronary Artery Disease  . PSVT    History of Present Illness: Eric Beltran is a 77 y.o. male last seen in April.  He presents for a follow-up visit.  States that he is doing very well, plans to start cardiac rehabilitation soon.  He does not report any angina, feels like he has more energy, also not reporting any significant palpitations.  Interval visit with Dr. Cyndia Bent noted in May, I reviewed the note.  He is status post redo CABG by Dr. Cyndia Bent with left radial to the diagonal, SVG to OM, SVG to PDA, and SVG to PL2 in March.  LVEF 50-55% by perioperative TEE. No medication changes were made at that time.  We went over his medications in detail today and discussed adjustments.  He states that he is daughter is concerned he might have sleep apnea, he plans to set up a sleep study with Dr. Luan Pulling his PCP.  Past Medical History:  Diagnosis Date  . Arthritis   . Ascending aortic aneurysm [facility])    Status post repair 12/2011 at [facility]  . BPH (benign prostatic hyperplasia)   . Chronic diastolic CHF (congestive heart failure) ([place])   . Coronary atherosclerosis of native coronary artery    a. Multivessel status post prior stenting and ultimately CABG 12/2011 at [facility] with with LIMA to LAD, SVG to PLB, SVG to ramus, SVG to OM . b. Last cath 06/2014 (following ischemic nuc) -> medical therapy, no feasible way to revascularize LCx territory.  . Essential hypertension   . Mixed hyperlipidemia   . Myocardial infarction ([place])   . Postoperative atrial fibrillation ([place])   . Supraventricular tachycardia [facility])     Past Surgical History:  Procedure Laterality Date  . ASCENDING AORTIC ANEURYSM REPAIR  12/2011 [facility]   26 mm.Dacron graft  . CORONARY ARTERY BYPASS GRAFT  12/2011 [facility]     LIMA to LAD, SVG to PLB, SVG to ramus, SVG to OM  . CORONARY ARTERY BYPASS GRAFT N/A 08/06/2017   Procedure: REDO CORONARY ARTERY BYPASS GRAFTING (CABG) TIMES FOUR UTILIZING LEFT GREATER SAPHENOUS VEIN HAVESTED ENDOVASCULARLY, LEFT RADIAL ARTERY.  RIGHT AXILLARY CANNULATION;  Surgeon: Gaye Pollack, MD;  Location: [place];  Service: Open Heart Surgery;  Laterality: N/A;  . HERNIA REPAIR    . LEFT HEART CATH AND CORS/GRAFTS ANGIOGRAPHY N/A 07/11/2017   Procedure: LEFT HEART CATH AND CORS/GRAFTS ANGIOGRAPHY;  Surgeon: Troy Sine, MD;  Location: [place] CV LAB;  Service: Cardiovascular;  Laterality: N/A;  . LEFT HEART CATHETERIZATION WITH CORONARY ANGIOGRAM N/A 06/18/2014   Procedure: LEFT HEART CATHETERIZATION WITH CORONARY ANGIOGRAM;  Surgeon: Blane Ohara, MD;  Location: [facility] CATH LAB;  Service: Cardiovascular;  Laterality: N/A;  . RADIAL ARTERY HARVEST Left 08/06/2017   Procedure: RADIAL ARTERY HARVEST;  Surgeon: Gaye Pollack, MD;  Location: [place];  Service: Open Heart Surgery;  Laterality: Left;  . TEE WITHOUT CARDIOVERSION N/A 08/06/2017   Procedure: TRANSESOPHAGEAL ECHOCARDIOGRAM (TEE);  Surgeon: Gaye Pollack, MD;  Location: [place];  Service: Open Heart Surgery;  Laterality: N/A;  . TOTAL HIP ARTHROPLASTY Right 02/07/2016   Procedure: RIGHT TOTAL HIP ARTHROPLASTY ANTERIOR APPROACH;  Surgeon: Mcarthur Rossetti, MD;  Location: [place];  Service: Orthopedics;  Laterality: Right;  .  TRANSURETHRAL RESECTION OF PROSTATE      Current Outpatient Medications  Medication Sig Dispense Refill  . aspirin EC 81 MG tablet Take 81 mg by mouth daily.    [name] atorvastatin (LIPITOR) 40 MG tablet Take 1 tablet (40 mg total) by mouth at bedtime. 90 tablet 1  . furosemide (LASIX) 20 MG tablet Take 2 tablets (40 mg total) by mouth daily. 180 tablet 3  . lisinopril (PRINIVIL,ZESTRIL) 2.5 MG tablet Take 1 tablet (2.5 mg total) by mouth daily. 30 tablet 1  . metoprolol tartrate (LOPRESSOR) 25 MG tablet  Take 1 tablet (25 mg total) by mouth 2 (two) times daily. 60 tablet 1  . Multiple Vitamin (MULTIVITAMIN) tablet Take 1 tablet by mouth at bedtime.     . Tamsulosin HCl (FLOMAX) 0.4 MG CAPS Take 0.4 mg by mouth daily.     [name] amiodarone (PACERONE) 100 MG tablet Take 1 tablet (100 mg total) by mouth daily. 90 tablet 3   No current facility-administered medications for this visit.    Allergies:  Patient has no known allergies.   Social History: The patient  reports that he has never smoked. He has never used smokeless tobacco. He reports that he drinks alcohol. He reports that he does not use drugs.   ROS:  Please see the history of present illness. Otherwise, complete review of systems is positive for none.  All other systems are reviewed and negative.   Physical Exam: VS:  BP (!) 144/76 (BP Location: Left Arm)   Pulse 94   Ht 5\' 7"  (1.702 m)   Wt 191 lb (86.6 kg)   SpO2 95%   BMI 29.91 kg/m , BMI Body mass index is 29.91 kg/m.  Wt Readings from Last 3 Encounters:  10/15/17 191 lb (86.6 kg)  09/11/17 192 lb 9.6 oz (87.4 kg)  08/30/17 193 lb (87.5 kg)    General: Patient appears comfortable at rest. HEENT: Conjunctiva and lids normal, oropharynx clear. Neck: Supple, no elevated JVP or carotid bruits, no thyromegaly. Lungs: Clear to auscultation, nonlabored breathing at rest. Thorax: Well-healed sternal incision. Cardiac: Regular rate and rhythm, no S3, 2/6 systolic murmur. Abdomen: Soft, nontender, bowel sounds present. Extremities: Mild ankle edema, distal pulses 2+. Skin: Warm and dry. Musculoskeletal: No kyphosis. Neuropsychiatric: Alert and oriented x3, affect grossly appropriate.  ECG: I personally reviewed the tracing from 08/30/2017 which showed sinus rhythm with prolonged PR interval and nonspecific T wave changes.  Recent Labwork: 03/05/2017: B Natriuretic Peptide 318.0; TSH 1.170 08/05/2017: ALT 20; AST 21 08/07/2017: Magnesium 2.4 08/09/2017: BUN 20; Creatinine, Ser  1.13; Hemoglobin 8.4; Platelets 138; Potassium 3.6; Sodium 134     Component Value Date/Time   CHOL 130 03/06/2017 0610   TRIG 262 (H) 03/06/2017 0610   HDL 28 (L) 03/06/2017 0610   CHOLHDL 4.6 03/06/2017 0610   VLDL 52 (H) 03/06/2017 0610   LDLCALC 50 03/06/2017 0610    Other Studies Reviewed Today:  Cardiac catheterization 07/11/2017:  Prox RCA to Mid RCA lesion is 30% stenosed.  Mid RCA lesion is 20% stenosed.  Post Atrio lesion is 95% stenosed.  Ost Cx to Prox Cx lesion is 100% stenosed.  Ost 1st Diag lesion is 90% stenosed.  Prox LAD-1 lesion is 80% stenosed.  Prox LAD-2 lesion is 70% stenosed.  The left ventricular ejection fraction is 50-55% by visual estimate.  The left ventricular systolic function is normal.  LV end diastolic pressure is mildly elevated.  Normal global LV function with ejection fraction  at 50-55%.  Severe native CAD with 80% proximal calcified LAD stenosis in the region of the first diagonal vessel with 90% ostial first diagonal stenosis and 60-70% diffuse LAD stenosis after the second diagonal vessel with competitive filling beyond this from the LIMA graft; total ostial occlusion of the left circumflex coronary artery at site of prior stenting. There is collateralization to 2 branches of the circumflex via the left injection. Large, very dominant RCA with diffuse calcification with 30% narrowing in the mid segment, a patent stent proximal to the acute margin with 20% intimal aplasia, and apparent significant calcific rocklike segment in the region just beyond the very large PDA takeoff and prior to to large inferior LV and posterior lateral branches.  Occlusion of the previous 3 saphenous vein conduits at its origin in the aorta.  Patent LIMA graft supplying the mid LAD.  RECOMMENDATION: Angiographic findings were reviewed with Dr. Burt Knack. It appears that the 90% very calcified "rock "in the dista RCA jeopardizes a very large distal RCA  system. This may not be amenable to atherectomy and and would be very difficult to stent which would also potentially jail these large branch vessels. With his significant concomitant disease with total occlusion of the circumflex and collateralized large branches that are good surgical targets and in addition to his proximal LAD and diagonal vessel stenosis outpatient surgical consultation will be initially obtained for consideration of redo CABG revascularization. Isosorbide will be added to his medical regimen.  TEE 08/06/2017: Left Ventricle Normal cavity size and wall thickness. LV systolic function is low normal with an EF of 50-55%. There are no obvious wall motion abnormalities. No thrombus present. No mass present.  Septum Normal atrial and ventricular septum with no septal defect and normal septal motion. No Patent Foramen Ovale present.  Left Atrium Left atrium normal in structure and function. No thrombus present. No left atrial appendage. No mass present. No ASD or PFO closure device in interatrial septum.  Aortic Valve The valve is trileaflet. Normal leaflet separation. No stenosis. Mild to moderate regurgitation. No AV vegetation.  Aorta No aneurysm present. Graft present in the ascending aorta. No aortic dissection present  Mitral Valve Normal valve structure. No leaflet thickening and calcification present. No stenosis. Normal leaflet mobility. Trace regurgitation. The jet direction is centrally-directed. No prolapse present. No vegetation present. No abscess present. No flail present.  Right Ventricle Normal cavity size, wall thickness and ejection fraction.  Right Atrium Normal right atrial size.  Tricuspid Valve Normal tricuspid valve structure. No stenosis. No regurgitation.  Pulmonic Valve Normal pulmonic valve structure. No prosthetic valve present. Trace regurgitation.    Assessment and Plan:  1.  Multivessel CAD status post redo CABG in March with Dr. Cyndia Bent as discussed  above.  Is doing well in the recovery period.  Plans to start cardiac rehabilitation soon.  He has come off Imdur and is on aspirin 81 mg daily.  2.  Postoperative atrial fibrillation, no obvious recurrences.  He has a history of PSVT as well which had been fairly difficult to control on much higher dose medications.  We will continue his Lopressor at current dose and for now reduce amiodarone to 100 mg daily.  I wonder whether his rhythm has been better suppressed on amiodarone in general.  We will check TSH and LFTs in the next 3 months.  Unless he has further atrial fibrillation, I do not plan anticoagulation long-term.  3.  Mixed hyperlipidemia, continues on Lipitor.  4.  Essential  hypertension, blood pressure is well controlled today.  5.  Screening for OSA, he plans to discuss sleep study with Dr. Luan Pulling.  Current medicines were reviewed with the patient today.   Orders Placed This Encounter  Procedures  . TSH  . Hepatic function panel    Disposition: Follow-up in 3 months.  Signed, Satira Sark, MD, [facility] 10/15/2017 2:17 PM    [facility] at [facility] [address] [place], [place] [address] Phone: [phone]; Fax: [phone]

## 2017-10-15 ENCOUNTER — Ambulatory Visit: Payer: Medicare Other | Admitting: Cardiology

## 2017-10-15 ENCOUNTER — Encounter: Payer: Self-pay | Admitting: Cardiology

## 2017-10-15 VITALS — BP 144/76 | HR 94 | Ht 67.0 in | Wt 191.0 lb

## 2017-10-15 DIAGNOSIS — I25119 Atherosclerotic heart disease of native coronary artery with unspecified angina pectoris: Secondary | ICD-10-CM

## 2017-10-15 DIAGNOSIS — I10 Essential (primary) hypertension: Secondary | ICD-10-CM

## 2017-10-15 DIAGNOSIS — I9789 Other postprocedural complications and disorders of the circulatory system, not elsewhere classified: Secondary | ICD-10-CM

## 2017-10-15 DIAGNOSIS — I471 Supraventricular tachycardia: Secondary | ICD-10-CM

## 2017-10-15 DIAGNOSIS — I4891 Unspecified atrial fibrillation: Secondary | ICD-10-CM | POA: Diagnosis not present

## 2017-10-15 DIAGNOSIS — E782 Mixed hyperlipidemia: Secondary | ICD-10-CM

## 2017-10-15 MED ORDER — AMIODARONE HCL 100 MG PO TABS: 100.0000 mg | ORAL_TABLET | Freq: Every day | ORAL | 3 refills | Status: DC

## 2017-10-15 NOTE — Patient Instructions (Signed)
Medication Instructions:   DECREASE AMIODARONE TO 100 MG DAILY   Labwork: JUST BEFORE 3 MONTH FOLLOW UP TSH LFT'S   Testing/Procedures: NONE  Follow-Up: Your physician recommends that you schedule a follow-up appointment in: 3 MONTHS ([place])    Any Other Special Instructions Will Be Listed Below (If Applicable).     If you need a refill on your cardiac medications before your next appointment, please call your pharmacy.

## 2017-10-17 ENCOUNTER — Ambulatory Visit: Admit: 2017-10-17 | Discharge: 2017-10-18 | Payer: MEDICARE

## 2017-10-17 DIAGNOSIS — L638 Other alopecia areata: Secondary | ICD-10-CM | POA: Diagnosis not present

## 2017-10-17 DIAGNOSIS — L639 Alopecia areata, unspecified: Principal | ICD-10-CM

## 2017-10-21 ENCOUNTER — Other Ambulatory Visit: Payer: Self-pay | Admitting: Physician Assistant

## 2017-10-22 ENCOUNTER — Encounter (HOSPITAL_COMMUNITY)
Admission: RE | Admit: 2017-10-22 | Discharge: 2017-10-22 | Disposition: A | Discharge status: Home / Self Care | Payer: Medicare Other | Source: Ambulatory Visit | Attending: Cardiology | Admitting: Cardiology

## 2017-10-22 ENCOUNTER — Encounter (HOSPITAL_COMMUNITY): Payer: Self-pay

## 2017-10-22 ENCOUNTER — Other Ambulatory Visit: Payer: Self-pay | Admitting: Cardiology

## 2017-10-22 VITALS — BP 122/60 | HR 63 | Ht 67.0 in | Wt 191.3 lb

## 2017-10-22 DIAGNOSIS — I252 Old myocardial infarction: Secondary | ICD-10-CM | POA: Insufficient documentation

## 2017-10-22 DIAGNOSIS — I110 Hypertensive heart disease with heart failure: Secondary | ICD-10-CM | POA: Insufficient documentation

## 2017-10-22 DIAGNOSIS — I2510 Atherosclerotic heart disease of native coronary artery without angina pectoris: Secondary | ICD-10-CM | POA: Diagnosis not present

## 2017-10-22 DIAGNOSIS — E782 Mixed hyperlipidemia: Secondary | ICD-10-CM | POA: Insufficient documentation

## 2017-10-22 DIAGNOSIS — Z79899 Other long term (current) drug therapy: Secondary | ICD-10-CM | POA: Diagnosis not present

## 2017-10-22 DIAGNOSIS — N400 Enlarged prostate without lower urinary tract symptoms: Secondary | ICD-10-CM | POA: Diagnosis not present

## 2017-10-22 DIAGNOSIS — Z7982 Long term (current) use of aspirin: Secondary | ICD-10-CM | POA: Insufficient documentation

## 2017-10-22 DIAGNOSIS — I5032 Chronic diastolic (congestive) heart failure: Secondary | ICD-10-CM | POA: Insufficient documentation

## 2017-10-22 DIAGNOSIS — Z951 Presence of aortocoronary bypass graft: Secondary | ICD-10-CM | POA: Diagnosis present

## 2017-10-22 DIAGNOSIS — M1990 Unspecified osteoarthritis, unspecified site: Secondary | ICD-10-CM | POA: Diagnosis not present

## 2017-10-22 DIAGNOSIS — Z8679 Personal history of other diseases of the circulatory system: Secondary | ICD-10-CM | POA: Insufficient documentation

## 2017-10-22 NOTE — Progress Notes (Signed)
Cardiac Individual Treatment Plan  Patient Details  Name: Eric Beltran MRN: [id number] Date of Birth: 1940/10/16 Referring Provider:     CARDIAC REHAB PHASE II ORIENTATION from 10/22/2017 in [place]  Referring Provider  Dr. Domenic Polite      Initial Encounter Date:    CARDIAC REHAB PHASE II ORIENTATION from 10/22/2017 in [place]  Date  10/22/17  Referring Provider  Dr. Domenic Polite      Visit Diagnosis: S/P CABG x 4  Patient's Home Medications on Admission:  Current Outpatient Medications:  .  amiodarone (PACERONE) 100 MG tablet, Take 1 tablet (100 mg total) by mouth daily., Disp: 90 tablet, Rfl: 3 .  aspirin EC 81 MG tablet, Take 81 mg by mouth daily., Disp: , Rfl:  .  atorvastatin (LIPITOR) 40 MG tablet, Take 1 tablet (40 mg total) by mouth at bedtime., Disp: 90 tablet, Rfl: 1 .  furosemide (LASIX) 20 MG tablet, Take 2 tablets (40 mg total) by mouth daily. (Patient taking differently: Take 40 mg by mouth daily as needed for fluid. ), Disp: 180 tablet, Rfl: 3 .  lisinopril (PRINIVIL,ZESTRIL) 2.5 MG tablet, TAKE 1 TABLET BY MOUTH EVERY DAY, Disp: 30 tablet, Rfl: 1 .  metoprolol tartrate (LOPRESSOR) 25 MG tablet, TAKE 1 TABLET BY MOUTH TWICE DAILY, Disp: 60 tablet, Rfl: 1 .  Multiple Vitamin (MULTIVITAMIN) tablet, Take 1 tablet by mouth at bedtime. , Disp: , Rfl:  .  Tamsulosin HCl (FLOMAX) 0.4 MG CAPS, Take 0.4 mg by mouth daily. , Disp: , Rfl:   Past Medical History: Past Medical History:  Diagnosis Date  . Arthritis   . Ascending aortic aneurysm [facility])    Status post repair 12/2011 at [facility]  . BPH (benign prostatic hyperplasia)   . Chronic diastolic CHF (congestive heart failure) ([place])   . Coronary atherosclerosis of native coronary artery    a. Multivessel status post prior stenting and ultimately CABG 12/2011 at [facility] with with LIMA to LAD, SVG to PLB, SVG to ramus, SVG to OM . b. Last cath 06/2014 (following ischemic nuc) ->  medical therapy, no feasible way to revascularize LCx territory.  . Essential hypertension   . Mixed hyperlipidemia   . Myocardial infarction ([place])   . Postoperative atrial fibrillation ([place])   . Supraventricular tachycardia ([place])     Tobacco Use: Social History   Tobacco Use  Smoking Status Never Smoker  Smokeless Tobacco Never Used  Tobacco Comment   tobacco use - no    Labs: Recent Review Flowsheet Data    Labs for ITP Cardiac and Pulmonary Rehab Latest Ref Rng & Units 08/06/2017 08/07/2017 08/07/2017 08/07/2017 08/08/2017   Cholestrol 0 - 200 mg/dL - - - - -   LDLCALC 0 - 99 mg/dL - - - - -   HDL >40 mg/dL - - - - -   Trlycerides <150 mg/dL - - - - -   Hemoglobin A1c 4.8 - 5.6 % - - - - -   PHART 7.350 - 7.450 - 7.388 7.349(L) - -   PCO2ART 32.0 - 48.0 mmHg - 38.5 42.6 - -   HCO3 20.0 - 28.0 mmol/L - 23.1 23.3 - -   TCO2 22 - 32 mmol/L 22 24 25 22 26    ACIDBASEDEF 0.0 - 2.0 mmol/L - 2.0 2.0 - -   O2SAT % - 98.0 93.0 - -      Capillary Blood Glucose: Lab Results  Component Value Date   GLUCAP 114 (  H) 08/08/2017   GLUCAP 139 (H) 08/08/2017   GLUCAP 152 (H) 08/07/2017   GLUCAP 151 (H) 08/07/2017   GLUCAP 120 (H) 08/07/2017     Exercise Target Goals: Date: 10/22/17  Exercise Program Goal: Individual exercise prescription set using results from initial 6 min walk test and THRR while considering  patient's activity barriers and safety.   Exercise Prescription Goal: Starting with aerobic activity 30 plus minutes a day, 3 days per week for initial exercise prescription. Provide home exercise prescription and guidelines that participant acknowledges understanding prior to discharge.  Activity Barriers & Risk Stratification: Activity Barriers & Cardiac Risk Stratification - 10/22/17 1432      Activity Barriers & Cardiac Risk Stratification   Activity Barriers  None    Cardiac Risk Stratification  High       6 Minute Walk: 6 Minute Walk    Row Name 10/22/17 1431          6 Minute Walk   Phase  Initial     Distance  1400 feet     Distance % Change  0 %     Distance Feet Change  0 ft     Walk Time  6 minutes     # of Rest Breaks  0     MPH  2.65     METS  3.03     RPE  11     Perceived Dyspnea   11     VO2 Peak  9.26     Symptoms  No     Resting HR  63 bpm     Resting BP  122/60     Resting Oxygen Saturation   94 %     Exercise Oxygen Saturation  during 6 min walk  91 %     Max Ex. HR  86 bpm     Max Ex. BP  150/66     2 Minute Post BP  124/62        Oxygen Initial Assessment:   Oxygen Re-Evaluation:   Oxygen Discharge (Final Oxygen Re-Evaluation):   Initial Exercise Prescription: Initial Exercise Prescription - 10/22/17 1400      Date of Initial Exercise RX and Referring Provider   Date  10/22/17    Referring Provider  Dr. Domenic Polite      NuStep   Level  1    SPM  73    Minutes  15    METs  2.3      Recumbant Elliptical   Level  1    RPM  51    Watts  64    Minutes  20    METs  3.6      Prescription Details   Frequency (times per week)  3    Duration  Progress to 30 minutes of continuous aerobic without signs/symptoms of physical distress      Intensity   THRR 40-80% of Max Heartrate  96-112-129    Ratings of Perceived Exertion  11-13    Perceived Dyspnea  0-4      Progression   Progression  Continue progressive overload as per policy without signs/symptoms or physical distress.      Resistance Training   Training Prescription  Yes    Weight  1    Reps  10-15       Perform Capillary Blood Glucose checks as needed.  Exercise Prescription Changes:   Exercise Comments:   Exercise Goals and Review:  Exercise Goals  [place] Name 10/22/17 1433             Exercise Goals   Increase Physical Activity  Yes       Intervention  Provide advice, education, support and counseling about physical activity/exercise needs.;Develop an individualized exercise prescription for aerobic and resistive training  based on initial evaluation findings, risk stratification, comorbidities and participant's personal goals.       Expected Outcomes  Short Term: Attend rehab on a regular basis to increase amount of physical activity.       Increase Strength and Stamina  Yes       Intervention  Provide advice, education, support and counseling about physical activity/exercise needs.;Develop an individualized exercise prescription for aerobic and resistive training based on initial evaluation findings, risk stratification, comorbidities and participant's personal goals.       Expected Outcomes  Short Term: Increase workloads from initial exercise prescription for resistance, speed, and METs.       Able to understand and use rate of perceived exertion (RPE) scale  Yes       Intervention  Provide education and explanation on how to use RPE scale       Expected Outcomes  Short Term: Able to use RPE daily in rehab to express subjective intensity level;Long Term:  Able to use RPE to guide intensity level when exercising independently       Able to understand and use Dyspnea scale  Yes       Intervention  Provide education and explanation on how to use Dyspnea scale       Expected Outcomes  Short Term: Able to use Dyspnea scale daily in rehab to express subjective sense of shortness of breath during exertion;Long Term: Able to use Dyspnea scale to guide intensity level when exercising independently       Knowledge and understanding of Target Heart Rate Range (THRR)  Yes       Intervention  Provide education and explanation of THRR including how the numbers were predicted and where they are located for reference       Expected Outcomes  Short Term: Able to state/look up THRR;Long Term: Able to use THRR to govern intensity when exercising independently;Short Term: Able to use daily as guideline for intensity in rehab       Able to check pulse independently  Yes       Intervention  Provide education and demonstration on how to  check pulse in carotid and radial arteries.;Review the importance of being able to check your own pulse for safety during independent exercise       Expected Outcomes  Short Term: Able to explain why pulse checking is important during independent exercise;Long Term: Able to check pulse independently and accurately       Understanding of Exercise Prescription  Yes       Intervention  Provide education, explanation, and written materials on patient's individual exercise prescription       Expected Outcomes  Short Term: Able to explain program exercise prescription;Long Term: Able to explain home exercise prescription to exercise independently          Exercise Goals Re-Evaluation :    Discharge Exercise Prescription (Final Exercise Prescription Changes):   Nutrition:  Target Goals: Understanding of nutrition guidelines, daily intake of sodium 1500mg , cholesterol 200mg , calories 30% from fat and 7% or less from saturated fats, daily to have 5 or more servings of fruits and vegetables.  Biometrics: Pre Biometrics - 10/22/17 1433  Pre Biometrics   Height  5\' 7"  (1.702 m)    Weight  191 lb 4.8 oz (86.8 kg)    Waist Circumference  39.5 inches    Hip Circumference  38.5 inches    Waist to Hip Ratio  1.03 %    BMI (Calculated)  29.95    Triceps Skinfold  6 mm    % Body Fat  24.8 %    Grip Strength  73.46 kg    Flexibility  15 in    Single Leg Stand  5 seconds        Nutrition Therapy Plan and Nutrition Goals: Nutrition Therapy & Goals - 10/22/17 1419      Personal Nutrition Goals   Personal Goal #2  Patient says he is trying to eat heart healthy.     Additional Goals?  No       Nutrition Assessments: Nutrition Assessments - 10/22/17 1420      MEDFICTS Scores   Pre Score  58       Nutrition Goals Re-Evaluation:   Nutrition Goals Discharge (Final Nutrition Goals Re-Evaluation):   Psychosocial: Target Goals: Acknowledge presence or absence of significant  depression and/or stress, maximize coping skills, provide positive support system. Participant is able to verbalize types and ability to use techniques and skills needed for reducing stress and depression.  Initial Review & Psychosocial Screening: Initial Psych Review & Screening - 10/22/17 1424      Initial Review   Current issues with  None Identified      Family Dynamics   Good Support System?  Yes      Barriers   Psychosocial barriers to participate in program  There are no identifiable barriers or psychosocial needs.      Screening Interventions   Interventions  Encouraged to exercise       Quality of Life Scores: Quality of Life - 10/22/17 1243      Quality of Life Scores   Health/Function Pre  26.3 %    Socioeconomic Pre  23.31 %    Psych/Spiritual Pre  21.71 %    Family Pre  25.2 %    GLOBAL Pre  24.54 %      Scores of 19 and below usually indicate a poorer quality of life in these areas.  A difference of  2-3 points is a clinically meaningful difference.  A difference of 2-3 points in the total score of the Quality of Life Index has been associated with significant improvement in overall quality of life, self-image, physical symptoms, and general health in studies assessing change in quality of life.  PHQ-9: Recent Review Flowsheet Data    Depression screen [facility] 2/9 10/22/2017 02/07/2012   Decreased Interest 0 0   Down, Depressed, Hopeless 0 0   PHQ - 2 Score 0 0   Altered sleeping 1 -   Tired, decreased energy 1 -   Change in appetite 1 -   Feeling bad or failure about yourself  0 -   Trouble concentrating 1 -   Moving slowly or fidgety/restless 0 -   Suicidal thoughts 0 -   PHQ-9 Score 4 -   Difficult doing work/chores Not difficult at all -     Interpretation of Total Score  Total Score Depression Severity:  1-4 = Minimal depression, 5-9 = Mild depression, 10-14 = Moderate depression, 15-19 = Moderately severe depression, 20-27 = Severe depression    Psychosocial Evaluation and Intervention: Psychosocial Evaluation - 10/22/17 1424  Psychosocial Evaluation & Interventions   Interventions  Encouraged to exercise with the program and follow exercise prescription    Continue Psychosocial Services   No Follow up required       Psychosocial Re-Evaluation:   Psychosocial Discharge (Final Psychosocial Re-Evaluation):   Vocational Rehabilitation: Provide vocational rehab assistance to qualifying candidates.   Vocational Rehab Evaluation & Intervention: Vocational Rehab - 10/22/17 1416      Initial Vocational Rehab Evaluation & Intervention   Assessment shows need for Vocational Rehabilitation  No       Education: Education Goals: Education classes will be provided on a weekly basis, covering required topics. Participant will state understanding/return demonstration of topics presented.  Learning Barriers/Preferences: Learning Barriers/Preferences - 10/22/17 1416      Learning Barriers/Preferences   Learning Barriers  None    Learning Preferences  Audio;Computer/Internet;Group Instruction;Individual Instruction;Pictoral;Skilled Demonstration;Verbal Instruction;Video;Written Material       Education Topics: Hypertension, Hypertension Reduction -Define heart disease and high blood pressure. Discus how high blood pressure affects the body and ways to reduce high blood pressure.   Exercise and Your Heart -Discuss why it is important to exercise, the FITT principles of exercise, normal and abnormal responses to exercise, and how to exercise safely.   Angina -Discuss definition of angina, causes of angina, treatment of angina, and how to decrease risk of having angina.   Cardiac Medications -Review what the following cardiac medications are used for, how they affect the body, and side effects that may occur when taking the medications.  Medications include Aspirin, Beta blockers, calcium channel blockers, ACE  Inhibitors, angiotensin receptor blockers, diuretics, digoxin, and antihyperlipidemics.   Congestive Heart Failure -Discuss the definition of CHF, how to live with CHF, the signs and symptoms of CHF, and how keep track of weight and sodium intake.   Heart Disease and Intimacy -Discus the effect sexual activity has on the heart, how changes occur during intimacy as we age, and safety during sexual activity.   Smoking Cessation / COPD -Discuss different methods to quit smoking, the health benefits of quitting smoking, and the definition of COPD.   Nutrition I: Fats -Discuss the types of cholesterol, what cholesterol does to the heart, and how cholesterol levels can be controlled.   Nutrition II: Labels -Discuss the different components of food labels and how to read food label   Heart Parts/Heart Disease and PAD -Discuss the anatomy of the heart, the pathway of blood circulation through the heart, and these are affected by heart disease.   Stress I: Signs and Symptoms -Discuss the causes of stress, how stress may lead to anxiety and depression, and ways to limit stress.   Stress II: Relaxation -Discuss different types of relaxation techniques to limit stress.   Warning Signs of Stroke / TIA -Discuss definition of a stroke, what the signs and symptoms are of a stroke, and how to identify when someone is having stroke.   Knowledge Questionnaire Score: Knowledge Questionnaire Score - 10/22/17 1416      Knowledge Questionnaire Score   Pre Score  24/24       Core Components/Risk Factors/Patient Goals at Admission: Personal Goals and Risk Factors at Admission - 10/22/17 1420      Core Components/Risk Factors/Patient Goals on Admission    Weight Management  Yes    Intervention  Weight Management/Obesity: Establish reasonable short term and long term weight goals.    Admit Weight  191 lb 4.8 oz (86.8 kg)    Goal Weight: Short  Term  186 lb 4.8 oz (84.5 kg)    Goal Weight:  Long Term  181 lb 4.8 oz (82.2 kg)    Expected Outcomes  Short Term: Continue to assess and modify interventions until short term weight is achieved;Long Term: Adherence to nutrition and physical activity/exercise program aimed toward attainment of established weight goal    Personal Goal Other  Yes    Personal Goal  Build Stamina, Get in shap and stay in shape.     Intervention  Attend CR 3 x week and supplement with home exercise 2xweek.     Expected Outcomes  Reach personal goals.        Core Components/Risk Factors/Patient Goals Review:    Core Components/Risk Factors/Patient Goals at Discharge (Final Review):    ITP Comments: ITP Comments    Row Name 10/22/17 1417           ITP Comments  Mr. Fritchman has been in our program 2 times before. He completed both times. He is very eager to get started again.           Comments: Patient arrived for 1st visit/orientation/education at 12:00. Patient was referred to CR by Dr. Domenic Polite due to CABGx4 (Z95.1). During orientation advised patient on arrival and appointment times what to wear, what to do before, during and after exercise. Reviewed attendance and class policy. Talked about inclement weather and class consultation policy. Pt is scheduled to return Cardiac Rehab on 10/23/17 at 1545. Pt was advised to come to class 15 minutes before class starts. Patient was also given instructions on meeting with the dietician and attending the Family Structure classes. Discussed RPE/Dpysnea scales. Discussed initial THR and how to find their radial and/or carotid pulse. Discussed the initial exercise prescription and how this effects their progress. Pt is eager to get started. Patient participated in warm up stretches followed by light weights and resistance bands. Patient was able to complete 6 minute walk test. Patient did not c/o pain. Patient was measured for the equipment. Discussed equipment safety with patient. Took patient pre-anthropometric  measurements. Patient finished visit at 1340.

## 2017-10-22 NOTE — Progress Notes (Signed)
Cardiac/Pulmonary Rehab Medication Review by a Pharmacist  Does the patient  feel that his/her medications are working for him/her?  yes  Has the patient been experiencing any side effects to the medications prescribed?  no  Does the patient measure his/her own blood pressure or blood glucose at home?  Yes, blood pressure  Does the patient have any problems obtaining medications due to transportation or finances?   no  Understanding of regimen: good Understanding of indications: good Potential of compliance: excellent  Pharmacist comments: No further questions at this time.  Pricilla Larsson 10/22/2017 1:27 PM

## 2017-10-22 NOTE — Progress Notes (Signed)
Daily Session Note  Patient Details  Name: Eric Beltran MRN: [id number] Date of Birth: 10-02-1940 Referring Provider:    Encounter Date: 10/22/2017  Check In: Session Check In - 10/22/17 1200      Check-In   Location  [facility]    Staff Present  Cephus Tupy Angelina Pih, MS, EP, [facility], Exercise Physiologist;Gregory Luther Parody, BS, EP, Exercise Physiologist    Supervising physician immediately available to respond to emergencies  See telemetry face sheet for immediately available MD    Medication changes reported      No    Fall or balance concerns reported     No    Tobacco Cessation  -- Never smoked    Warm-up and Cool-down  Performed as group-led instruction    Resistance Training Performed  Yes    VAD Patient?  No      Pain Assessment   Currently in Pain?  No/denies    Pain Score  0-No pain    Multiple Pain Sites  No       Capillary Blood Glucose: No results found for this or any previous visit (from the past 24 hour(s)).    Social History   Tobacco Use  Smoking Status Never Smoker  Smokeless Tobacco Never Used  Tobacco Comment   tobacco use - no    Goals Met:  Independence with exercise equipment Exercise tolerated well Personal goals reviewed No report of cardiac concerns or symptoms Strength training completed today  Goals Unmet:  Not Applicable  Comments: Check out: 1340   Dr. Kate Sable is Medical Director for [facility] and Pulmonary Rehab.

## 2017-10-23 ENCOUNTER — Encounter (HOSPITAL_COMMUNITY)
Admission: RE | Admit: 2017-10-23 | Discharge: 2017-10-23 | Disposition: A | Discharge status: Home / Self Care | Payer: Medicare Other | Source: Ambulatory Visit | Attending: Cardiology | Admitting: Cardiology

## 2017-10-23 DIAGNOSIS — Z951 Presence of aortocoronary bypass graft: Secondary | ICD-10-CM | POA: Diagnosis not present

## 2017-10-23 NOTE — Progress Notes (Signed)
Daily Session Note  Patient Details  Name: Eric Beltran MRN: [id number] Date of Birth: Oct 30, 1940 Referring Provider:     CARDIAC REHAB PHASE II ORIENTATION from 10/22/2017 in [place]  Referring Provider  Dr. Domenic Polite      Encounter Date: 10/23/2017  Check In: Session Check In - 10/23/17 1545      Check-In   Location  [facility]    Staff Present  Russella Dar, MS, EP, [facility], Exercise Physiologist;Gregory Luther Parody, BS, EP, Exercise Physiologist    Supervising physician immediately available to respond to emergencies  See telemetry face sheet for immediately available MD    Medication changes reported      No    Fall or balance concerns reported     No    Warm-up and Cool-down  Performed as group-led instruction    Resistance Training Performed  Yes    VAD Patient?  No      Pain Assessment   Currently in Pain?  No/denies    Pain Score  0-No pain    Multiple Pain Sites  No       Capillary Blood Glucose: No results found for this or any previous visit (from the past 24 hour(s)).    Social History   Tobacco Use  Smoking Status Never Smoker  Smokeless Tobacco Never Used  Tobacco Comment   tobacco use - no    Goals Met:  Independence with exercise equipment Exercise tolerated well No report of cardiac concerns or symptoms Strength training completed today  Goals Unmet:  Not Applicable  Comments: Check out 1645.   Dr. Kate Sable is Medical Director for [facility] Cardiac and Pulmonary Rehab.

## 2017-10-24 NOTE — Progress Notes (Signed)
Cardiac Individual Treatment Plan  Patient Details  Name: Eric Beltran MRN: [id number] Date of Birth: 1941-04-30 Referring Provider:     CARDIAC REHAB PHASE II ORIENTATION from 10/22/2017 in [place]  Referring Provider  Dr. Domenic Polite      Initial Encounter Date:    CARDIAC REHAB PHASE II ORIENTATION from 10/22/2017 in [place]  Date  10/22/17  Referring Provider  Dr. Domenic Polite      Visit Diagnosis: S/P CABG x 4  Patient's Home Medications on Admission:  Current Outpatient Medications:  .  amiodarone (PACERONE) 100 MG tablet, Take 1 tablet (100 mg total) by mouth daily., Disp: 90 tablet, Rfl: 3 .  aspirin EC 81 MG tablet, Take 81 mg by mouth daily., Disp: , Rfl:  .  atorvastatin (LIPITOR) 40 MG tablet, Take 1 tablet (40 mg total) by mouth at bedtime., Disp: 90 tablet, Rfl: 1 .  furosemide (LASIX) 20 MG tablet, Take 2 tablets (40 mg total) by mouth daily. (Patient taking differently: Take 40 mg by mouth daily as needed for fluid. ), Disp: 180 tablet, Rfl: 3 .  lisinopril (PRINIVIL,ZESTRIL) 2.5 MG tablet, TAKE 1 TABLET BY MOUTH EVERY DAY, Disp: 30 tablet, Rfl: 1 .  metoprolol tartrate (LOPRESSOR) 25 MG tablet, TAKE 1 TABLET BY MOUTH TWICE DAILY, Disp: 60 tablet, Rfl: 1 .  Multiple Vitamin (MULTIVITAMIN) tablet, Take 1 tablet by mouth at bedtime. , Disp: , Rfl:  .  Tamsulosin HCl (FLOMAX) 0.4 MG CAPS, Take 0.4 mg by mouth daily. , Disp: , Rfl:   Past Medical History: Past Medical History:  Diagnosis Date  . Arthritis   . Ascending aortic aneurysm [facility])    Status post repair 12/2011 at [facility]  . BPH (benign prostatic hyperplasia)   . Chronic diastolic CHF (congestive heart failure) ([place])   . Coronary atherosclerosis of native coronary artery    a. Multivessel status post prior stenting and ultimately CABG 12/2011 at [facility] with with LIMA to LAD, SVG to PLB, SVG to ramus, SVG to OM . b. Last cath 06/2014 (following ischemic nuc) ->  medical therapy, no feasible way to revascularize LCx territory.  . Essential hypertension   . Mixed hyperlipidemia   . Myocardial infarction ([place])   . Postoperative atrial fibrillation ([place])   . Supraventricular tachycardia ([place])     Tobacco Use: Social History   Tobacco Use  Smoking Status Never Smoker  Smokeless Tobacco Never Used  Tobacco Comment   tobacco use - no    Labs: Recent Review Flowsheet Data    Labs for ITP Cardiac and Pulmonary Rehab Latest Ref Rng & Units 08/06/2017 08/07/2017 08/07/2017 08/07/2017 08/08/2017   Cholestrol 0 - 200 mg/dL - - - - -   LDLCALC 0 - 99 mg/dL - - - - -   HDL >40 mg/dL - - - - -   Trlycerides <150 mg/dL - - - - -   Hemoglobin A1c 4.8 - 5.6 % - - - - -   PHART 7.350 - 7.450 - 7.388 7.349(L) - -   PCO2ART 32.0 - 48.0 mmHg - 38.5 42.6 - -   HCO3 20.0 - 28.0 mmol/L - 23.1 23.3 - -   TCO2 22 - 32 mmol/L 22 24 25 22 26    ACIDBASEDEF 0.0 - 2.0 mmol/L - 2.0 2.0 - -   O2SAT % - 98.0 93.0 - -      Capillary Blood Glucose: Lab Results  Component Value Date   GLUCAP 114 (  H) 08/08/2017   GLUCAP 139 (H) 08/08/2017   GLUCAP 152 (H) 08/07/2017   GLUCAP 151 (H) 08/07/2017   GLUCAP 120 (H) 08/07/2017     Exercise Target Goals:    Exercise Program Goal: Individual exercise prescription set using results from initial 6 min walk test and THRR while considering  patient's activity barriers and safety.   Exercise Prescription Goal: Starting with aerobic activity 30 plus minutes a day, 3 days per week for initial exercise prescription. Provide home exercise prescription and guidelines that participant acknowledges understanding prior to discharge.  Activity Barriers & Risk Stratification: Activity Barriers & Cardiac Risk Stratification - 10/22/17 1432      Activity Barriers & Cardiac Risk Stratification   Activity Barriers  None    Cardiac Risk Stratification  High       6 Minute Walk: 6 Minute Walk    Row Name 10/22/17 1431         6  Minute Walk   Phase  Initial     Distance  1400 feet     Distance % Change  0 %     Distance Feet Change  0 ft     Walk Time  6 minutes     # of Rest Breaks  0     MPH  2.65     METS  3.03     RPE  11     Perceived Dyspnea   11     VO2 Peak  9.26     Symptoms  No     Resting HR  63 bpm     Resting BP  122/60     Resting Oxygen Saturation   94 %     Exercise Oxygen Saturation  during 6 min walk  91 %     Max Ex. HR  86 bpm     Max Ex. BP  150/66     2 Minute Post BP  124/62        Oxygen Initial Assessment:   Oxygen Re-Evaluation:   Oxygen Discharge (Final Oxygen Re-Evaluation):   Initial Exercise Prescription: Initial Exercise Prescription - 10/22/17 1400      Date of Initial Exercise RX and Referring Provider   Date  10/22/17    Referring Provider  Dr. Domenic Polite      NuStep   Level  1    SPM  73    Minutes  15    METs  2.3      Recumbant Elliptical   Level  1    RPM  51    Watts  64    Minutes  20    METs  3.6      Prescription Details   Frequency (times per week)  3    Duration  Progress to 30 minutes of continuous aerobic without signs/symptoms of physical distress      Intensity   THRR 40-80% of Max Heartrate  96-112-129    Ratings of Perceived Exertion  11-13    Perceived Dyspnea  0-4      Progression   Progression  Continue progressive overload as per policy without signs/symptoms or physical distress.      Resistance Training   Training Prescription  Yes    Weight  1    Reps  10-15       Perform Capillary Blood Glucose checks as needed.  Exercise Prescription Changes:   Exercise Comments:   Exercise Goals and Review:  Exercise Goals  [place] Name 10/22/17 1433             Exercise Goals   Increase Physical Activity  Yes       Intervention  Provide advice, education, support and counseling about physical activity/exercise needs.;Develop an individualized exercise prescription for aerobic and resistive training based on  initial evaluation findings, risk stratification, comorbidities and participant's personal goals.       Expected Outcomes  Short Term: Attend rehab on a regular basis to increase amount of physical activity.       Increase Strength and Stamina  Yes       Intervention  Provide advice, education, support and counseling about physical activity/exercise needs.;Develop an individualized exercise prescription for aerobic and resistive training based on initial evaluation findings, risk stratification, comorbidities and participant's personal goals.       Expected Outcomes  Short Term: Increase workloads from initial exercise prescription for resistance, speed, and METs.       Able to understand and use rate of perceived exertion (RPE) scale  Yes       Intervention  Provide education and explanation on how to use RPE scale       Expected Outcomes  Short Term: Able to use RPE daily in rehab to express subjective intensity level;Long Term:  Able to use RPE to guide intensity level when exercising independently       Able to understand and use Dyspnea scale  Yes       Intervention  Provide education and explanation on how to use Dyspnea scale       Expected Outcomes  Short Term: Able to use Dyspnea scale daily in rehab to express subjective sense of shortness of breath during exertion;Long Term: Able to use Dyspnea scale to guide intensity level when exercising independently       Knowledge and understanding of Target Heart Rate Range (THRR)  Yes       Intervention  Provide education and explanation of THRR including how the numbers were predicted and where they are located for reference       Expected Outcomes  Short Term: Able to state/look up THRR;Long Term: Able to use THRR to govern intensity when exercising independently;Short Term: Able to use daily as guideline for intensity in rehab       Able to check pulse independently  Yes       Intervention  Provide education and demonstration on how to check  pulse in carotid and radial arteries.;Review the importance of being able to check your own pulse for safety during independent exercise       Expected Outcomes  Short Term: Able to explain why pulse checking is important during independent exercise;Long Term: Able to check pulse independently and accurately       Understanding of Exercise Prescription  Yes       Intervention  Provide education, explanation, and written materials on patient's individual exercise prescription       Expected Outcomes  Short Term: Able to explain program exercise prescription;Long Term: Able to explain home exercise prescription to exercise independently          Exercise Goals Re-Evaluation :    Discharge Exercise Prescription (Final Exercise Prescription Changes):   Nutrition:  Target Goals: Understanding of nutrition guidelines, daily intake of sodium 1500mg , cholesterol 200mg , calories 30% from fat and 7% or less from saturated fats, daily to have 5 or more servings of fruits and vegetables.  Biometrics: Pre Biometrics - 10/22/17 1433  Pre Biometrics   Height  5\' 7"  (1.702 m)    Weight  191 lb 4.8 oz (86.8 kg)    Waist Circumference  39.5 inches    Hip Circumference  38.5 inches    Waist to Hip Ratio  1.03 %    BMI (Calculated)  29.95    Triceps Skinfold  6 mm    % Body Fat  24.8 %    Grip Strength  73.46 kg    Flexibility  15 in    Single Leg Stand  5 seconds        Nutrition Therapy Plan and Nutrition Goals: Nutrition Therapy & Goals - 10/22/17 1419      Personal Nutrition Goals   Personal Goal #2  Patient says he is trying to eat heart healthy.     Additional Goals?  No       Nutrition Assessments: Nutrition Assessments - 10/22/17 1420      MEDFICTS Scores   Pre Score  58       Nutrition Goals Re-Evaluation:   Nutrition Goals Discharge (Final Nutrition Goals Re-Evaluation):   Psychosocial: Target Goals: Acknowledge presence or absence of significant depression  and/or stress, maximize coping skills, provide positive support system. Participant is able to verbalize types and ability to use techniques and skills needed for reducing stress and depression.  Initial Review & Psychosocial Screening: Initial Psych Review & Screening - 10/22/17 1424      Initial Review   Current issues with  None Identified      Family Dynamics   Good Support System?  Yes      Barriers   Psychosocial barriers to participate in program  There are no identifiable barriers or psychosocial needs.      Screening Interventions   Interventions  Encouraged to exercise       Quality of Life Scores: Quality of Life - 10/22/17 1243      Quality of Life Scores   Health/Function Pre  26.3 %    Socioeconomic Pre  23.31 %    Psych/Spiritual Pre  21.71 %    Family Pre  25.2 %    GLOBAL Pre  24.54 %      Scores of 19 and below usually indicate a poorer quality of life in these areas.  A difference of  2-3 points is a clinically meaningful difference.  A difference of 2-3 points in the total score of the Quality of Life Index has been associated with significant improvement in overall quality of life, self-image, physical symptoms, and general health in studies assessing change in quality of life.  PHQ-9: Recent Review Flowsheet Data    Depression screen [facility] 2/9 10/22/2017 02/07/2012   Decreased Interest 0 0   Down, Depressed, Hopeless 0 0   PHQ - 2 Score 0 0   Altered sleeping 1 -   Tired, decreased energy 1 -   Change in appetite 1 -   Feeling bad or failure about yourself  0 -   Trouble concentrating 1 -   Moving slowly or fidgety/restless 0 -   Suicidal thoughts 0 -   PHQ-9 Score 4 -   Difficult doing work/chores Not difficult at all -     Interpretation of Total Score  Total Score Depression Severity:  1-4 = Minimal depression, 5-9 = Mild depression, 10-14 = Moderate depression, 15-19 = Moderately severe depression, 20-27 = Severe depression   Psychosocial  Evaluation and Intervention: Psychosocial Evaluation - 10/22/17 1424  Psychosocial Evaluation & Interventions   Interventions  Encouraged to exercise with the program and follow exercise prescription    Continue Psychosocial Services   No Follow up required       Psychosocial Re-Evaluation:   Psychosocial Discharge (Final Psychosocial Re-Evaluation):   Vocational Rehabilitation: Provide vocational rehab assistance to qualifying candidates.   Vocational Rehab Evaluation & Intervention: Vocational Rehab - 10/22/17 1416      Initial Vocational Rehab Evaluation & Intervention   Assessment shows need for Vocational Rehabilitation  No       Education: Education Goals: Education classes will be provided on a weekly basis, covering required topics. Participant will state understanding/return demonstration of topics presented.  Learning Barriers/Preferences: Learning Barriers/Preferences - 10/22/17 1416      Learning Barriers/Preferences   Learning Barriers  None    Learning Preferences  Audio;Computer/Internet;Group Instruction;Individual Instruction;Pictoral;Skilled Demonstration;Verbal Instruction;Video;Written Material       Education Topics: Hypertension, Hypertension Reduction -Define heart disease and high blood pressure. Discus how high blood pressure affects the body and ways to reduce high blood pressure.   Exercise and Your Heart -Discuss why it is important to exercise, the FITT principles of exercise, normal and abnormal responses to exercise, and how to exercise safely.   Angina -Discuss definition of angina, causes of angina, treatment of angina, and how to decrease risk of having angina.   Cardiac Medications -Review what the following cardiac medications are used for, how they affect the body, and side effects that may occur when taking the medications.  Medications include Aspirin, Beta blockers, calcium channel blockers, ACE Inhibitors, angiotensin  receptor blockers, diuretics, digoxin, and antihyperlipidemics.   Congestive Heart Failure -Discuss the definition of CHF, how to live with CHF, the signs and symptoms of CHF, and how keep track of weight and sodium intake.   Heart Disease and Intimacy -Discus the effect sexual activity has on the heart, how changes occur during intimacy as we age, and safety during sexual activity.   Smoking Cessation / COPD -Discuss different methods to quit smoking, the health benefits of quitting smoking, and the definition of COPD.   Nutrition I: Fats -Discuss the types of cholesterol, what cholesterol does to the heart, and how cholesterol levels can be controlled.   Nutrition II: Labels -Discuss the different components of food labels and how to read food label   Heart Parts/Heart Disease and PAD -Discuss the anatomy of the heart, the pathway of blood circulation through the heart, and these are affected by heart disease.   Stress I: Signs and Symptoms -Discuss the causes of stress, how stress may lead to anxiety and depression, and ways to limit stress.   Stress II: Relaxation -Discuss different types of relaxation techniques to limit stress.   Warning Signs of Stroke / TIA -Discuss definition of a stroke, what the signs and symptoms are of a stroke, and how to identify when someone is having stroke.   Knowledge Questionnaire Score: Knowledge Questionnaire Score - 10/22/17 1416      Knowledge Questionnaire Score   Pre Score  24/24       Core Components/Risk Factors/Patient Goals at Admission: Personal Goals and Risk Factors at Admission - 10/22/17 1420      Core Components/Risk Factors/Patient Goals on Admission    Weight Management  Yes    Intervention  Weight Management/Obesity: Establish reasonable short term and long term weight goals.    Admit Weight  191 lb 4.8 oz (86.8 kg)    Goal Weight: Short  Term  186 lb 4.8 oz (84.5 kg)    Goal Weight: Long Term  181 lb 4.8 oz  (82.2 kg)    Expected Outcomes  Short Term: Continue to assess and modify interventions until short term weight is achieved;Long Term: Adherence to nutrition and physical activity/exercise program aimed toward attainment of established weight goal    Personal Goal Other  Yes    Personal Goal  Build Stamina, Get in shap and stay in shape.     Intervention  Attend CR 3 x week and supplement with home exercise 2xweek.     Expected Outcomes  Reach personal goals.        Core Components/Risk Factors/Patient Goals Review:    Core Components/Risk Factors/Patient Goals at Discharge (Final Review):    ITP Comments: ITP Comments    Row Name 10/22/17 1417 10/24/17 1005         ITP Comments  Mr. Netzel has been in our program 2 times before. He completed both times. He is very eager to get started again.   Patient is new to program. He has completed 2 sessions. Will continue to monitor for progress.          Comments: ITP 30 Day REVIEW Patient new to program. He has completed 2 sessions. Will continue to monitor for progress.

## 2017-10-25 ENCOUNTER — Encounter (HOSPITAL_COMMUNITY)
Admission: RE | Admit: 2017-10-25 | Discharge: 2017-10-25 | Disposition: A | Discharge status: Home / Self Care | Payer: Medicare Other | Source: Ambulatory Visit | Attending: Cardiology | Admitting: Cardiology

## 2017-10-25 DIAGNOSIS — Z951 Presence of aortocoronary bypass graft: Secondary | ICD-10-CM

## 2017-10-25 NOTE — Progress Notes (Signed)
Daily Session Note  Patient Details  Name: Eric Beltran MRN: [id number] Date of Birth: [birth year] Referring Provider:     CARDIAC REHAB PHASE II ORIENTATION from 10/22/2017 in [place]  Referring Provider  Dr. Domenic Polite      Encounter Date: 10/25/2017  Check In: Session Check In - 10/25/17 1543      Check-In   Location  [facility]    Staff Present  Diane Angelina Pih, MS, EP, [facility], Exercise Physiologist;Gregory Luther Parody, BS, EP, Exercise Physiologist;Joseph Johns Wynetta Emery, RN, BSN    Supervising physician immediately available to respond to emergencies  See telemetry face sheet for immediately available MD    Medication changes reported      No    Fall or balance concerns reported     No    Warm-up and Cool-down  Performed as group-led instruction    Resistance Training Performed  Yes    VAD Patient?  No      Pain Assessment   Currently in Pain?  No/denies    Pain Score  0-No pain    Multiple Pain Sites  No       Capillary Blood Glucose: No results found for this or any previous visit (from the past 24 hour(s)).    Social History   Tobacco Use  Smoking Status Never Smoker  Smokeless Tobacco Never Used  Tobacco Comment   tobacco use - no    Goals Met:  Independence with exercise equipment Exercise tolerated well No report of cardiac concerns or symptoms Strength training completed today  Goals Unmet:  Not Applicable  Comments: Check out 1645.   Dr. Kate Sable is Medical Director for [facility] Cardiac and Pulmonary Rehab.

## 2017-10-28 ENCOUNTER — Encounter (HOSPITAL_COMMUNITY)
Admission: RE | Admit: 2017-10-28 | Discharge: 2017-10-28 | Disposition: A | Discharge status: Home / Self Care | Payer: Medicare Other | Source: Ambulatory Visit | Attending: Cardiology | Admitting: Cardiology

## 2017-10-28 DIAGNOSIS — Z951 Presence of aortocoronary bypass graft: Secondary | ICD-10-CM

## 2017-10-28 NOTE — Progress Notes (Signed)
Daily Session Note  Patient Details  Name: Eric Beltran MRN: [id number] Date of Birth: 12/14/1940 Referring Provider:     CARDIAC REHAB PHASE II ORIENTATION from 10/22/2017 in [place]  Referring Provider  Dr. Domenic Polite      Encounter Date: 10/28/2017  Check In: Session Check In - 10/28/17 1541      Check-In   Location  [facility]    Staff Present  Diane Angelina Pih, MS, EP, [facility], Exercise Physiologist;Gregory Luther Parody, BS, EP, Exercise Physiologist;Raegan Winders Wynetta Emery, RN, BSN    Supervising physician immediately available to respond to emergencies  See telemetry face sheet for immediately available MD    Medication changes reported      No    Fall or balance concerns reported     No    Warm-up and Cool-down  Performed as group-led instruction    Resistance Training Performed  Yes    VAD Patient?  No      Pain Assessment   Currently in Pain?  No/denies    Pain Score  0-No pain    Multiple Pain Sites  No       Capillary Blood Glucose: No results found for this or any previous visit (from the past 24 hour(s)).  Exercise Prescription Changes - 10/28/17 1400      Response to Exercise   Blood Pressure (Admit)  160/70    Blood Pressure (Exercise)  206/84    Blood Pressure (Exit)  160/72    Heart Rate (Admit)  69 bpm    Heart Rate (Exercise)  97 bpm    Heart Rate (Exit)  78 bpm    Rating of Perceived Exertion (Exercise)  12    Duration  Progress to 30 minutes of  aerobic without signs/symptoms of physical distress    Intensity  THRR New 99-114-130      Progression   Progression  Continue to progress workloads to maintain intensity without signs/symptoms of physical distress.      Resistance Training   Training Prescription  Yes    Weight  1    Reps  10-15      NuStep   Level  1    SPM  111    Minutes  15    METs  3.4      Recumbant Elliptical   Level  1    RPM  70    Watts  101    Minutes  20    METs  5.5       Social History    Tobacco Use  Smoking Status Never Smoker  Smokeless Tobacco Never Used  Tobacco Comment   tobacco use - no    Goals Met:  Independence with exercise equipment Exercise tolerated well No report of cardiac concerns or symptoms Strength training completed today  Goals Unmet:  Not Applicable  Comments: Check out 1645.   Dr. Kate Sable is Medical Director for [facility] Cardiac and Pulmonary Rehab.

## 2017-10-30 ENCOUNTER — Encounter (HOSPITAL_COMMUNITY)
Admission: RE | Admit: 2017-10-30 | Discharge: 2017-10-30 | Disposition: A | Discharge status: Home / Self Care | Payer: Medicare Other | Source: Ambulatory Visit | Attending: Cardiology | Admitting: Cardiology

## 2017-10-30 DIAGNOSIS — Z951 Presence of aortocoronary bypass graft: Secondary | ICD-10-CM | POA: Diagnosis not present

## 2017-10-30 NOTE — Progress Notes (Signed)
Daily Session Note  Patient Details  Name: Eric Beltran MRN: [id number] Date of Birth: 1940-07-07 Referring Provider:     CARDIAC REHAB PHASE II ORIENTATION from 10/22/2017 in [place]  Referring Provider  Dr. Domenic Polite      Encounter Date: 10/30/2017  Check In: Session Check In - 10/30/17 1545      Check-In   Location  [facility]    Staff Present  Diane Angelina Pih, MS, EP, [facility], Exercise Physiologist;Gregory Luther Parody, BS, EP, Exercise Physiologist;Lovelle Lema Wynetta Emery, RN, BSN    Supervising physician immediately available to respond to emergencies  See telemetry face sheet for immediately available MD    Medication changes reported      No    Fall or balance concerns reported     No    Warm-up and Cool-down  Performed as group-led instruction    Resistance Training Performed  Yes    VAD Patient?  No      Pain Assessment   Currently in Pain?  No/denies    Pain Score  0-No pain    Multiple Pain Sites  No       Capillary Blood Glucose: No results found for this or any previous visit (from the past 24 hour(s)).    Social History   Tobacco Use  Smoking Status Never Smoker  Smokeless Tobacco Never Used  Tobacco Comment   tobacco use - no    Goals Met:  Independence with exercise equipment Exercise tolerated well No report of cardiac concerns or symptoms Strength training completed today  Goals Unmet:  Not Applicable  Comments: Check out 1645.   Dr. Kate Sable is Medical Director for [facility] Cardiac and Pulmonary Rehab.

## 2017-11-01 ENCOUNTER — Encounter (HOSPITAL_COMMUNITY)
Admission: RE | Admit: 2017-11-01 | Discharge: 2017-11-01 | Disposition: A | Discharge status: Home / Self Care | Payer: Medicare Other | Source: Ambulatory Visit | Attending: Cardiology | Admitting: Cardiology

## 2017-11-01 DIAGNOSIS — Z951 Presence of aortocoronary bypass graft: Secondary | ICD-10-CM | POA: Diagnosis not present

## 2017-11-01 NOTE — Progress Notes (Signed)
Daily Session Note  Patient Details  Name: Eric Beltran MRN: [id number] Date of Birth: December 04, 1940 Referring Provider:     CARDIAC REHAB PHASE II ORIENTATION from 10/22/2017 in [place]  Referring Provider  Dr. Domenic Polite      Encounter Date: 11/01/2017  Check In: Session Check In - 11/01/17 1545      Check-In   Location  [facility]    Staff Present  Morenike Cuff Angelina Pih, MS, EP, [facility] Exercise Physiologist;Gregory Luther Parody, BS, EP, Exercise Physiologist    Supervising physician immediately available to respond to emergencies  See telemetry face sheet for immediately available MD    Medication changes reported      No    Fall or balance concerns reported     No    Warm-up and Cool-down  Performed as group-led instruction    Resistance Training Performed  Yes    VAD Patient?  No      Pain Assessment   Currently in Pain?  No/denies    Pain Score  0-No pain    Multiple Pain Sites  No       Capillary Blood Glucose: No results found for this or any previous visit (from the past 24 hour(s)).    Social History   Tobacco Use  Smoking Status Never Smoker  Smokeless Tobacco Never Used  Tobacco Comment   tobacco use - no    Goals Met:  Independence with exercise equipment Exercise tolerated well No report of cardiac concerns or symptoms Strength training completed today  Goals Unmet:  Not Applicable  Comments: Check out: [place]   Dr. Kate Sable is Medical Director for [facility] and Pulmonary Rehab.

## 2017-11-04 ENCOUNTER — Encounter (HOSPITAL_COMMUNITY)
Admission: RE | Admit: 2017-11-04 | Discharge: 2017-11-04 | Disposition: A | Discharge status: Home / Self Care | Payer: Medicare Other | Source: Ambulatory Visit | Attending: Cardiology | Admitting: Cardiology

## 2017-11-04 DIAGNOSIS — Z951 Presence of aortocoronary bypass graft: Secondary | ICD-10-CM

## 2017-11-04 NOTE — Progress Notes (Signed)
Daily Session Note  Patient Details  Name: Eric Beltran MRN: [id number] Date of Birth: 1941/03/09 Referring Provider:     CARDIAC REHAB PHASE II ORIENTATION from 10/22/2017 in [place]  Referring Provider  Dr. Domenic Polite      Encounter Date: 11/04/2017  Check In: Session Check In - 11/04/17 1545      Check-In   Location  [facility]    Staff Present  Diane Angelina Pih, MS, EP, [facility], Exercise Physiologist;Kryssa Risenhoover Wynetta Emery, RN, BSN    Supervising physician immediately available to respond to emergencies  See telemetry face sheet for immediately available MD    Medication changes reported      No    Fall or balance concerns reported     No    Warm-up and Cool-down  Performed as group-led instruction    Resistance Training Performed  Yes    VAD Patient?  No      Pain Assessment   Currently in Pain?  No/denies    Pain Score  0-No pain    Multiple Pain Sites  No       Capillary Blood Glucose: No results found for this or any previous visit (from the past 24 hour(s)).    Social History   Tobacco Use  Smoking Status Never Smoker  Smokeless Tobacco Never Used  Tobacco Comment   tobacco use - no    Goals Met:  Independence with exercise equipment Exercise tolerated well No report of cardiac concerns or symptoms Strength training completed today  Goals Unmet:  NA  Comments: Check out 1645.   Dr. Kate Sable is Medical Director for [facility] Cardiac and Pulmonary Rehab.

## 2017-11-06 ENCOUNTER — Ambulatory Visit (INDEPENDENT_AMBULATORY_CARE_PROVIDER_SITE_OTHER): Payer: Self-pay | Admitting: Surgery

## 2017-11-06 ENCOUNTER — Encounter: Payer: Self-pay | Admitting: Surgery

## 2017-11-06 ENCOUNTER — Other Ambulatory Visit: Payer: Self-pay

## 2017-11-06 ENCOUNTER — Encounter (HOSPITAL_COMMUNITY)
Admission: RE | Admit: 2017-11-06 | Discharge: 2017-11-06 | Disposition: A | Discharge status: Home / Self Care | Payer: Medicare Other | Source: Ambulatory Visit | Attending: Cardiology | Admitting: Cardiology

## 2017-11-06 VITALS — BP 126/76 | HR 76 | Resp 16 | Ht 67.0 in | Wt 187.8 lb

## 2017-11-06 DIAGNOSIS — Z951 Presence of aortocoronary bypass graft: Secondary | ICD-10-CM

## 2017-11-06 DIAGNOSIS — I2510 Atherosclerotic heart disease of native coronary artery without angina pectoris: Secondary | ICD-10-CM

## 2017-11-06 NOTE — Progress Notes (Signed)
Daily Session Note  Patient Details  Name: ZAHKI HOOGENDOORN MRN: [id number] Date of Birth: 1941/04/21 Referring Provider:     CARDIAC REHAB PHASE II ORIENTATION from 10/22/2017 in [place]  Referring Provider  Dr. Domenic Polite      Encounter Date: 11/06/2017  Check In: Session Check In - 11/06/17 1545      Check-In   Location  [facility]    Staff Present  Aundra Dubin, RN, BSN;Gregory Luther Parody, BS, EP, Exercise Physiologist    Supervising physician immediately available to respond to emergencies  See telemetry face sheet for immediately available MD    Medication changes reported      No    Fall or balance concerns reported     No    Warm-up and Cool-down  Performed as group-led instruction    Resistance Training Performed  Yes    VAD Patient?  No    PAD/SET Patient?  No      Pain Assessment   Currently in Pain?  No/denies    Pain Score  0-No pain    Multiple Pain Sites  No       Capillary Blood Glucose: No results found for this or any previous visit (from the past 24 hour(s)).    Social History   Tobacco Use  Smoking Status Never Smoker  Smokeless Tobacco Never Used  Tobacco Comment   tobacco use - no    Goals Met:  Independence with exercise equipment Exercise tolerated well No report of cardiac concerns or symptoms Strength training completed today  Goals Unmet:  Not Applicable  Comments: Check out 1645.   Dr. Kate Sable is Medical Director for [facility] Cardiac and Pulmonary Rehab.

## 2017-11-06 NOTE — Progress Notes (Signed)
  HPI:  Patient returns for routine postoperative follow-up having undergone redo coronary artery bypass graft surgery x4 using a left radial artery graft and 3 vein grafts on 08/06/2017. The patient's early postoperative recovery while in the hospital was notable for development of postoperative atrial fibrillation converted with amiodarone.  He maintained sinus rhythm after that and did not need anticoagulation.  Since I last saw him on 09/11/2017 he has been feeling well.  He is in cardiac rehab now feels like his stamina is improving.  He denies any chest pain or shortness of breath.    Current Outpatient Medications  Medication Sig Dispense Refill  . amiodarone (PACERONE) 100 MG tablet Take 1 tablet (100 mg total) by mouth daily. (Patient taking differently: Take 50 mg by mouth daily. ) 90 tablet 3  . aspirin EC 81 MG tablet Take 81 mg by mouth daily.    [name] atorvastatin (LIPITOR) 40 MG tablet Take 1 tablet (40 mg total) by mouth at bedtime. 90 tablet 1  . furosemide (LASIX) 20 MG tablet Take 2 tablets (40 mg total) by mouth daily. (Patient taking differently: Take 40 mg by mouth daily as needed for fluid. ) 180 tablet 3  . lisinopril (PRINIVIL,ZESTRIL) 2.5 MG tablet TAKE 1 TABLET BY MOUTH EVERY DAY 30 tablet 1  . metoprolol tartrate (LOPRESSOR) 25 MG tablet TAKE 1 TABLET BY MOUTH TWICE DAILY 60 tablet 1  . Multiple Vitamin (MULTIVITAMIN) tablet Take 1 tablet by mouth at bedtime.     . Tamsulosin HCl (FLOMAX) 0.4 MG CAPS Take 0.4 mg by mouth daily.      No current facility-administered medications for this visit.     Physical Exam: BP 126/76 (BP Location: Right Arm, Patient Position: Sitting, Cuff Size: Normal)   Pulse 76   Resp 16   Ht 5\' 7"  (1.702 m)   Wt 187 lb 12.8 oz (85.2 kg)   SpO2 94% Comment: ON RA  BMI 29.41 kg/m  He looks well. Cardiac exam shows regular rate and rhythm with normal heart sounds. Lung exam is clear. The chest incision is healing well and sternum  stable. His left leg incision is healing well and there is mild left ankle edema.  There is no edema in the right leg. The left arm incision is healing well and the hand is neurovascularly intact.  Diagnostic Tests:  None today  Impression:  Overall I think he is making good progress following redo coronary bypass graft surgery.  He is asymptomatic and making good progress with cardiac rehab.  I encouraged him to continue watching his diet closely and to maintain physical activity so that he can gradually lose weight.  He has had no further problems with atrial fibrillation and Dr. Domenic Polite will decide when to stop his amiodarone.  I asked him not to lift anything heavier than 10 pounds for 3 months postoperatively.  Plan:  He will continue follow-up with Dr. Domenic Polite and will contact me if he develops any problems with his incisions.   Gaye Pollack, MD [facility] [phone]

## 2017-11-08 ENCOUNTER — Encounter (HOSPITAL_COMMUNITY)
Admission: RE | Admit: 2017-11-08 | Discharge: 2017-11-08 | Disposition: A | Discharge status: Home / Self Care | Payer: Medicare Other | Source: Ambulatory Visit | Attending: Cardiology | Admitting: Cardiology

## 2017-11-08 DIAGNOSIS — Z951 Presence of aortocoronary bypass graft: Secondary | ICD-10-CM | POA: Diagnosis not present

## 2017-11-08 NOTE — Progress Notes (Signed)
Daily Session Note  Patient Details  Name: Eric Beltran MRN: [id number] Date of Birth: [birth year] Referring Provider:     CARDIAC REHAB PHASE II ORIENTATION from 10/22/2017 in [place]  Referring Provider  Dr. Domenic Polite      Encounter Date: 11/08/2017  Check In: Session Check In - 11/08/17 1545      Check-In   Location  [facility]    Staff Present  Suzanne Boron, BS, EP, Exercise Physiologist;Dewayne Jurek Wynetta Emery, RN, BSN    Supervising physician immediately available to respond to emergencies  See telemetry face sheet for immediately available MD    Medication changes reported      No    Fall or balance concerns reported     No    Warm-up and Cool-down  Performed as group-led instruction    Resistance Training Performed  Yes    VAD Patient?  No    PAD/SET Patient?  No      Pain Assessment   Currently in Pain?  No/denies    Pain Score  0-No pain    Multiple Pain Sites  No       Capillary Blood Glucose: No results found for this or any previous visit (from the past 24 hour(s)).    Social History   Tobacco Use  Smoking Status Never Smoker  Smokeless Tobacco Never Used  Tobacco Comment   tobacco use - no    Goals Met:  Independence with exercise equipment Exercise tolerated well No report of cardiac concerns or symptoms Strength training completed today  Goals Unmet:  Not Applicable  Comments: Check out 1645.   Dr. Kate Sable is Medical Director for [facility] Cardiac and Pulmonary Rehab.

## 2017-11-11 ENCOUNTER — Encounter (HOSPITAL_COMMUNITY)
Admission: RE | Admit: 2017-11-11 | Discharge: 2017-11-11 | Disposition: A | Discharge status: Home / Self Care | Payer: Medicare Other | Source: Ambulatory Visit | Attending: Cardiology | Admitting: Cardiology

## 2017-11-11 DIAGNOSIS — M1990 Unspecified osteoarthritis, unspecified site: Secondary | ICD-10-CM | POA: Insufficient documentation

## 2017-11-11 DIAGNOSIS — I5032 Chronic diastolic (congestive) heart failure: Secondary | ICD-10-CM | POA: Diagnosis not present

## 2017-11-11 DIAGNOSIS — E782 Mixed hyperlipidemia: Secondary | ICD-10-CM | POA: Diagnosis not present

## 2017-11-11 DIAGNOSIS — Z8679 Personal history of other diseases of the circulatory system: Secondary | ICD-10-CM | POA: Insufficient documentation

## 2017-11-11 DIAGNOSIS — N400 Enlarged prostate without lower urinary tract symptoms: Secondary | ICD-10-CM | POA: Insufficient documentation

## 2017-11-11 DIAGNOSIS — Z7982 Long term (current) use of aspirin: Secondary | ICD-10-CM | POA: Diagnosis not present

## 2017-11-11 DIAGNOSIS — Z951 Presence of aortocoronary bypass graft: Secondary | ICD-10-CM | POA: Diagnosis not present

## 2017-11-11 DIAGNOSIS — Z79899 Other long term (current) drug therapy: Secondary | ICD-10-CM | POA: Diagnosis not present

## 2017-11-11 DIAGNOSIS — I110 Hypertensive heart disease with heart failure: Secondary | ICD-10-CM | POA: Diagnosis not present

## 2017-11-11 DIAGNOSIS — I2510 Atherosclerotic heart disease of native coronary artery without angina pectoris: Secondary | ICD-10-CM | POA: Diagnosis not present

## 2017-11-11 DIAGNOSIS — I252 Old myocardial infarction: Secondary | ICD-10-CM | POA: Insufficient documentation

## 2017-11-11 NOTE — Progress Notes (Signed)
Daily Session Note  Patient Details  Name: CHIVAS NOTZ MRN: [id number] Date of Birth: [birth year] Referring Provider:     CARDIAC REHAB PHASE II ORIENTATION from 10/22/2017 in [place]  Referring Provider  Dr. Domenic Polite      Encounter Date: 11/11/2017  Check In: Session Check In - 11/11/17 1545      Check-In   Location  [facility]    Staff Present  Suzanne Boron, BS, EP, Exercise Physiologist;Fernando Stoiber Wynetta Emery, RN, BSN;Diane Coad, MS, EP, [facility], Exercise Physiologist    Supervising physician immediately available to respond to emergencies  See telemetry face sheet for immediately available MD    Medication changes reported      No    Fall or balance concerns reported     No    Warm-up and Cool-down  Performed as group-led instruction    Resistance Training Performed  Yes    VAD Patient?  No    PAD/SET Patient?  No      Pain Assessment   Currently in Pain?  No/denies    Pain Score  0-No pain    Multiple Pain Sites  No       Capillary Blood Glucose: No results found for this or any previous visit (from the past 24 hour(s)).  Exercise Prescription Changes - 11/11/17 1500      Response to Exercise   Blood Pressure (Admit)  132/60    Blood Pressure (Exercise)  180/78    Blood Pressure (Exit)  122/60    Heart Rate (Admit)  66 bpm    Heart Rate (Exercise)  90 bpm    Heart Rate (Exit)  54 bpm    Rating of Perceived Exertion (Exercise)  13    Duration  Progress to 30 minutes of  aerobic without signs/symptoms of physical distress    Intensity  THRR New [id number]      Progression   Progression  Continue to progress workloads to maintain intensity without signs/symptoms of physical distress.      Resistance Training   Training Prescription  Yes    Weight  3    Reps  10-15      NuStep   Level  1    SPM  127    Minutes  15    METs  4.9      Recumbant Elliptical   Level  1    RPM  73    Watts  108    Minutes  20    METs  5.8        Social History   Tobacco Use  Smoking Status Never Smoker  Smokeless Tobacco Never Used  Tobacco Comment   tobacco use - no    Goals Met:  Independence with exercise equipment Exercise tolerated well No report of cardiac concerns or symptoms Strength training completed today  Goals Unmet:  Not Applicable  Comments: Check out 1645.   Dr. Kate Sable is Medical Director for [facility] Cardiac and Pulmonary Rehab.

## 2017-11-13 ENCOUNTER — Encounter (HOSPITAL_COMMUNITY)
Admission: RE | Admit: 2017-11-13 | Discharge: 2017-11-13 | Disposition: A | Discharge status: Home / Self Care | Payer: Medicare Other | Source: Ambulatory Visit | Attending: Cardiology | Admitting: Cardiology

## 2017-11-13 DIAGNOSIS — Z951 Presence of aortocoronary bypass graft: Secondary | ICD-10-CM | POA: Diagnosis not present

## 2017-11-13 NOTE — Progress Notes (Signed)
Daily Session Note  Patient Details  Name: Eric Beltran MRN: [id number] Date of Birth: [birth year] Referring Provider:     CARDIAC REHAB PHASE II ORIENTATION from 10/22/2017 in [place]  Referring Provider  Dr. Domenic Polite      Encounter Date: 11/13/2017  Check In: Session Check In - 11/13/17 1545      Check-In   Location  [facility]    Staff Present  Suzanne Boron, BS, EP, Exercise Physiologist;Jushua Waltman Wynetta Emery, RN, BSN;Diane Coad, MS, EP, [facility], Exercise Physiologist    Supervising physician immediately available to respond to emergencies  See telemetry face sheet for immediately available MD    Medication changes reported      No    Fall or balance concerns reported     No    Warm-up and Cool-down  Performed as group-led instruction    Resistance Training Performed  Yes    VAD Patient?  No    PAD/SET Patient?  No      Pain Assessment   Currently in Pain?  No/denies    Pain Score  0-No pain    Multiple Pain Sites  No       Capillary Blood Glucose: No results found for this or any previous visit (from the past 24 hour(s)).    Social History   Tobacco Use  Smoking Status Never Smoker  Smokeless Tobacco Never Used  Tobacco Comment   tobacco use - no    Goals Met:  Independence with exercise equipment Exercise tolerated well No report of cardiac concerns or symptoms Strength training completed today  Goals Unmet:  Not Applicable  Comments: Check out 1645.   Dr. Kate Sable is Medical Director for [facility] Cardiac and Pulmonary Rehab.

## 2017-11-15 ENCOUNTER — Other Ambulatory Visit: Payer: Self-pay | Admitting: *Deleted

## 2017-11-15 ENCOUNTER — Encounter (HOSPITAL_COMMUNITY)
Admission: RE | Admit: 2017-11-15 | Discharge: 2017-11-15 | Disposition: A | Discharge status: Home / Self Care | Payer: Medicare Other | Source: Ambulatory Visit | Attending: Cardiology | Admitting: Cardiology

## 2017-11-15 DIAGNOSIS — Z951 Presence of aortocoronary bypass graft: Secondary | ICD-10-CM | POA: Diagnosis not present

## 2017-11-15 MED ORDER — LISINOPRIL 2.5 MG PO TABS: 2.5000 mg | ORAL_TABLET | Freq: Every day | ORAL | 3 refills | Status: DC

## 2017-11-15 NOTE — Progress Notes (Signed)
Daily Session Note  Patient Details  Name: Eric Beltran. MRN: [id number] Date of Birth: November 05, 1940 Referring Provider:     CARDIAC REHAB PHASE II ORIENTATION from 10/22/2017 in [place]  Referring Provider  Dr. Domenic Polite      Encounter Date: 11/15/2017  Check In: Session Check In - 11/15/17 1545      Check-In   Location  [facility]    Staff Present  Suzanne Boron, BS, EP, Exercise Physiologist;Taeveon Keesling Wynetta Emery, RN, BSN;Diane Coad, MS, EP, [facility], Exercise Physiologist    Supervising physician immediately available to respond to emergencies  See telemetry face sheet for immediately available MD    Medication changes reported      No    Fall or balance concerns reported     No    Warm-up and Cool-down  Performed as group-led instruction    Resistance Training Performed  Yes    VAD Patient?  No    PAD/SET Patient?  No      Pain Assessment   Currently in Pain?  No/denies    Pain Score  0-No pain    Multiple Pain Sites  No       Capillary Blood Glucose: No results found for this or any previous visit (from the past 24 hour(s)).    Social History   Tobacco Use  Smoking Status Never Smoker  Smokeless Tobacco Never Used  Tobacco Comment   tobacco use - no    Goals Met:  Independence with exercise equipment Exercise tolerated well No report of cardiac concerns or symptoms Strength training completed today  Goals Unmet:  Not Applicable  Comments: Check out 1645.   Dr. Kate Sable is Medical Director for [facility] Cardiac and Pulmonary Rehab.

## 2017-11-18 ENCOUNTER — Encounter (HOSPITAL_COMMUNITY)
Admission: RE | Admit: 2017-11-18 | Discharge: 2017-11-18 | Disposition: A | Discharge status: Home / Self Care | Payer: Medicare Other | Source: Ambulatory Visit | Attending: Cardiology | Admitting: Cardiology

## 2017-11-18 DIAGNOSIS — Z951 Presence of aortocoronary bypass graft: Secondary | ICD-10-CM

## 2017-11-18 NOTE — Progress Notes (Signed)
Daily Session Note  Patient Details  Name: Eric Beltran. MRN: [id number] Date of Birth: 1940-11-15 Referring Provider:     CARDIAC REHAB PHASE II ORIENTATION from 10/22/2017 in [place]  Referring Provider  Dr. Domenic Polite      Encounter Date: 11/18/2017  Check In: Session Check In - 11/18/17 1545      Check-In   Location  [facility]    Staff Present  Suzanne Boron, BS, EP, Exercise Physiologist;Vinia Jemmott Wynetta Emery, RN, BSN;Diane Coad, MS, EP, [facility], Exercise Physiologist    Supervising physician immediately available to respond to emergencies  See telemetry face sheet for immediately available MD    Medication changes reported      No    Fall or balance concerns reported     No    Warm-up and Cool-down  Performed as group-led instruction    Resistance Training Performed  Yes    VAD Patient?  No    PAD/SET Patient?  No      Pain Assessment   Currently in Pain?  No/denies    Pain Score  0-No pain    Multiple Pain Sites  No       Capillary Blood Glucose: No results found for this or any previous visit (from the past 24 hour(s)).    Social History   Tobacco Use  Smoking Status Never Smoker  Smokeless Tobacco Never Used  Tobacco Comment   tobacco use - no    Goals Met:  Independence with exercise equipment Exercise tolerated well No report of cardiac concerns or symptoms Strength training completed today  Goals Unmet:  Not Applicable  Comments: Check out 1645.   Dr. Kate Sable is Medical Director for [facility] Cardiac and Pulmonary Rehab.

## 2017-11-20 ENCOUNTER — Encounter (HOSPITAL_COMMUNITY)
Admission: RE | Admit: 2017-11-20 | Discharge: 2017-11-20 | Disposition: A | Discharge status: Home / Self Care | Payer: Medicare Other | Source: Ambulatory Visit | Attending: Cardiology | Admitting: Cardiology

## 2017-11-20 DIAGNOSIS — Z951 Presence of aortocoronary bypass graft: Secondary | ICD-10-CM

## 2017-11-20 NOTE — Progress Notes (Signed)
Daily Session Note  Patient Details  Name: Eric Beltran. MRN: [id number] Date of Birth: 04-29-1941 Referring Provider:     CARDIAC REHAB PHASE II ORIENTATION from 10/22/2017 in [place]  Referring Provider  Dr. Domenic Polite      Encounter Date: 11/20/2017  Check In: Session Check In - 11/20/17 1545      Check-In   Location  [facility]    Staff Present  Suzanne Boron, BS, EP, Exercise Physiologist;Sruti Ayllon Wynetta Emery, RN, BSN;Diane Coad, MS, EP, [facility], Exercise Physiologist    Supervising physician immediately available to respond to emergencies  See telemetry face sheet for immediately available MD    Medication changes reported      No    Fall or balance concerns reported     No    Warm-up and Cool-down  Performed as group-led instruction    Resistance Training Performed  Yes    VAD Patient?  No    PAD/SET Patient?  No      Pain Assessment   Currently in Pain?  No/denies    Pain Score  0-No pain    Multiple Pain Sites  No       Capillary Blood Glucose: No results found for this or any previous visit (from the past 24 hour(s)).    Social History   Tobacco Use  Smoking Status Never Smoker  Smokeless Tobacco Never Used  Tobacco Comment   tobacco use - no    Goals Met:  Independence with exercise equipment Exercise tolerated well No report of cardiac concerns or symptoms Strength training completed today  Goals Unmet:  Not Applicable  Comments: Check out 1645.   Dr. Kate Sable is Medical Director for [facility] Cardiac and Pulmonary Rehab.

## 2017-11-20 NOTE — Progress Notes (Signed)
Cardiac Individual Treatment Plan  Patient Details  Name: Eric Beltran. MRN: [id number] Date of Birth: 10-31-1940 Referring Provider:     CARDIAC REHAB PHASE II ORIENTATION from 10/22/2017 in [place]  Referring Provider  Dr. Domenic Polite      Initial Encounter Date:    CARDIAC REHAB PHASE II ORIENTATION from 10/22/2017 in [place]  Date  10/22/17      Visit Diagnosis: S/P CABG x 4  Patient's Home Medications on Admission:  Current Outpatient Medications:  .  amiodarone (PACERONE) 100 MG tablet, Take 1 tablet (100 mg total) by mouth daily. (Patient taking differently: Take 50 mg by mouth daily. ), Disp: 90 tablet, Rfl: 3 .  aspirin EC 81 MG tablet, Take 81 mg by mouth daily., Disp: , Rfl:  .  atorvastatin (LIPITOR) 40 MG tablet, Take 1 tablet (40 mg total) by mouth at bedtime., Disp: 90 tablet, Rfl: 1 .  furosemide (LASIX) 20 MG tablet, Take 2 tablets (40 mg total) by mouth daily. (Patient taking differently: Take 40 mg by mouth daily as needed for fluid. ), Disp: 180 tablet, Rfl: 3 .  lisinopril (PRINIVIL,ZESTRIL) 2.5 MG tablet, Take 1 tablet (2.5 mg total) by mouth daily., Disp: 90 tablet, Rfl: 3 .  metoprolol tartrate (LOPRESSOR) 25 MG tablet, TAKE 1 TABLET BY MOUTH TWICE DAILY, Disp: 60 tablet, Rfl: 1 .  Multiple Vitamin (MULTIVITAMIN) tablet, Take 1 tablet by mouth at bedtime. , Disp: , Rfl:  .  Tamsulosin HCl (FLOMAX) 0.4 MG CAPS, Take 0.4 mg by mouth daily. , Disp: , Rfl:   Past Medical History: Past Medical History:  Diagnosis Date  . Arthritis   . Ascending aortic aneurysm [facility])    Status post repair 12/2011 at [facility]  . BPH (benign prostatic hyperplasia)   . Chronic diastolic CHF (congestive heart failure) ([place])   . Coronary atherosclerosis of native coronary artery    a. Multivessel status post prior stenting and ultimately CABG 12/2011 at [facility] with with LIMA to LAD, SVG to PLB, SVG to ramus, SVG to OM . b. Last cath  06/2014 (following ischemic nuc) -> medical therapy, no feasible way to revascularize LCx territory.  . Essential hypertension   . Mixed hyperlipidemia   . Myocardial infarction ([place])   . Postoperative atrial fibrillation ([place])   . Supraventricular tachycardia ([place])     Tobacco Use: Social History   Tobacco Use  Smoking Status Never Smoker  Smokeless Tobacco Never Used  Tobacco Comment   tobacco use - no    Labs: Recent Review Flowsheet Data    Labs for ITP Cardiac and Pulmonary Rehab Latest Ref Rng & Units 08/06/2017 08/07/2017 08/07/2017 08/07/2017 08/08/2017   Cholestrol 0 - 200 mg/dL - - - - -   LDLCALC 0 - 99 mg/dL - - - - -   HDL >40 mg/dL - - - - -   Trlycerides <150 mg/dL - - - - -   Hemoglobin A1c 4.8 - 5.6 % - - - - -   PHART 7.350 - 7.450 - 7.388 7.349(L) - -   PCO2ART 32.0 - 48.0 mmHg - 38.5 42.6 - -   HCO3 20.0 - 28.0 mmol/L - 23.1 23.3 - -   TCO2 22 - 32 mmol/L 22 24 25 22 26    ACIDBASEDEF 0.0 - 2.0 mmol/L - 2.0 2.0 - -   O2SAT % - 98.0 93.0 - -      Capillary Blood Glucose: Lab Results  Component Value Date   GLUCAP 114 (H) 08/08/2017   GLUCAP 139 (H) 08/08/2017   GLUCAP 152 (H) 08/07/2017   GLUCAP 151 (H) 08/07/2017   GLUCAP 120 (H) 08/07/2017     Exercise Target Goals:    Exercise Program Goal: Individual exercise prescription set using results from initial 6 min walk test and THRR while considering  patient's activity barriers and safety.   Exercise Prescription Goal: Starting with aerobic activity 30 plus minutes a day, 3 days per week for initial exercise prescription. Provide home exercise prescription and guidelines that participant acknowledges understanding prior to discharge.  Activity Barriers & Risk Stratification: Activity Barriers & Cardiac Risk Stratification - 10/22/17 1432      Activity Barriers & Cardiac Risk Stratification   Activity Barriers  None    Cardiac Risk Stratification  High       6 Minute Walk: 6 Minute Walk     Row Name 10/22/17 1431         6 Minute Walk   Phase  Initial     Distance  1400 feet     Distance % Change  0 %     Distance Feet Change  0 ft     Walk Time  6 minutes     # of Rest Breaks  0     MPH  2.65     METS  3.03     RPE  11     Perceived Dyspnea   11     VO2 Peak  9.26     Symptoms  No     Resting HR  63 bpm     Resting BP  122/60     Resting Oxygen Saturation   94 %     Exercise Oxygen Saturation  during 6 min walk  91 %     Max Ex. HR  86 bpm     Max Ex. BP  150/66     2 Minute Post BP  124/62        Oxygen Initial Assessment:   Oxygen Re-Evaluation:   Oxygen Discharge (Final Oxygen Re-Evaluation):   Initial Exercise Prescription: Initial Exercise Prescription - 10/22/17 1400      Date of Initial Exercise RX and Referring Provider   Date  10/22/17    Referring Provider  Dr. Domenic Polite      NuStep   Level  1    SPM  73    Minutes  15    METs  2.3      Recumbant Elliptical   Level  1    RPM  51    Watts  64    Minutes  20    METs  3.6      Prescription Details   Frequency (times per week)  3    Duration  Progress to 30 minutes of continuous aerobic without signs/symptoms of physical distress      Intensity   THRR 40-80% of Max Heartrate  96-112-129    Ratings of Perceived Exertion  11-13    Perceived Dyspnea  0-4      Progression   Progression  Continue progressive overload as per policy without signs/symptoms or physical distress.      Resistance Training   Training Prescription  Yes    Weight  1    Reps  10-15       Perform Capillary Blood Glucose checks as needed.  Exercise Prescription Changes:  Exercise Prescription Changes    Row  Name 10/28/17 1400 11/11/17 1500           Response to Exercise   Blood Pressure (Admit)  160/70  132/60      Blood Pressure (Exercise)  206/84  180/78      Blood Pressure (Exit)  160/72  122/60      Heart Rate (Admit)  69 bpm  66 bpm      Heart Rate (Exercise)  97 bpm  90 bpm      Heart  Rate (Exit)  78 bpm  54 bpm      Rating of Perceived Exertion (Exercise)  12  13      Duration  Progress to 30 minutes of  aerobic without signs/symptoms of physical distress  Progress to 30 minutes of  aerobic without signs/symptoms of physical distress      Intensity  THRR New 99-114-130  THRR New 98-113-129        Progression   Progression  Continue to progress workloads to maintain intensity without signs/symptoms of physical distress.  Continue to progress workloads to maintain intensity without signs/symptoms of physical distress.        Resistance Training   Training Prescription  Yes  Yes      Weight  1  3      Reps  10-15  10-15        NuStep   Level  1  1      SPM  111  127      Minutes  15  15      METs  3.4  4.9        Recumbant Elliptical   Level  1  1      RPM  70  73      Watts  101  108      Minutes  20  20      METs  5.5  5.8         Exercise Comments:  Exercise Comments    Row Name 10/28/17 1452 11/11/17 1510         Exercise Comments  Patient has just started CR and will be progressed in time.   Patient has been doing very well in CR. patient has increase his dumbbell size to 3lbs and has also increased the RPMs and SPMs on his machines by a high amount. Patient will continued to be monitored in CR.          Exercise Goals and Review:  Exercise Goals    Row Name 10/22/17 1433             Exercise Goals   Increase Physical Activity  Yes       Intervention  Provide advice, education, support and counseling about physical activity/exercise needs.;Develop an individualized exercise prescription for aerobic and resistive training based on initial evaluation findings, risk stratification, comorbidities and participant's personal goals.       Expected Outcomes  Short Term: Attend rehab on a regular basis to increase amount of physical activity.       Increase Strength and Stamina  Yes       Intervention  Provide advice, education, support and  counseling about physical activity/exercise needs.;Develop an individualized exercise prescription for aerobic and resistive training based on initial evaluation findings, risk stratification, comorbidities and participant's personal goals.       Expected Outcomes  Short Term: Increase workloads from initial exercise prescription for resistance, speed, and METs.  Able to understand and use rate of perceived exertion (RPE) scale  Yes       Intervention  Provide education and explanation on how to use RPE scale       Expected Outcomes  Short Term: Able to use RPE daily in rehab to express subjective intensity level;Long Term:  Able to use RPE to guide intensity level when exercising independently       Able to understand and use Dyspnea scale  Yes       Intervention  Provide education and explanation on how to use Dyspnea scale       Expected Outcomes  Short Term: Able to use Dyspnea scale daily in rehab to express subjective sense of shortness of breath during exertion;Long Term: Able to use Dyspnea scale to guide intensity level when exercising independently       Knowledge and understanding of Target Heart Rate Range (THRR)  Yes       Intervention  Provide education and explanation of THRR including how the numbers were predicted and where they are located for reference       Expected Outcomes  Short Term: Able to state/look up THRR;Long Term: Able to use THRR to govern intensity when exercising independently;Short Term: Able to use daily as guideline for intensity in rehab       Able to check pulse independently  Yes       Intervention  Provide education and demonstration on how to check pulse in carotid and radial arteries.;Review the importance of being able to check your own pulse for safety during independent exercise       Expected Outcomes  Short Term: Able to explain why pulse checking is important during independent exercise;Long Term: Able to check pulse independently and accurately        Understanding of Exercise Prescription  Yes       Intervention  Provide education, explanation, and written materials on patient's individual exercise prescription       Expected Outcomes  Short Term: Able to explain program exercise prescription;Long Term: Able to explain home exercise prescription to exercise independently          Exercise Goals Re-Evaluation : Exercise Goals Re-Evaluation    Row Name 11/15/17 1458             Exercise Goal Re-Evaluation   Exercise Goals Review  Increase Physical Activity;Increase Strength and Stamina;Able to understand and use rate of perceived exertion (RPE) scale;Understanding of Exercise Prescription;Able to understand and use Dyspnea scale;Knowledge and understanding of Target Heart Rate Range (THRR);Improve claudication pain tolerance and improve walking ability;Able to check pulse independently       Comments  Patient has been doing well in CR. patient has stated to me that he has an increase of stamina and has gained some stamina from beginning CR. This has helped hom in tasks when he is not here in CR.        Expected Outcomes  Patient wishes to gain stamina back and get back into shape.            Discharge Exercise Prescription (Final Exercise Prescription Changes): Exercise Prescription Changes - 11/11/17 1500      Response to Exercise   Blood Pressure (Admit)  132/60    Blood Pressure (Exercise)  180/78    Blood Pressure (Exit)  122/60    Heart Rate (Admit)  66 bpm    Heart Rate (Exercise)  90 bpm    Heart Rate (Exit)  54 bpm    Rating of Perceived Exertion (Exercise)  13    Duration  Progress to 30 minutes of  aerobic without signs/symptoms of physical distress    Intensity  THRR New 98-113-129      Progression   Progression  Continue to progress workloads to maintain intensity without signs/symptoms of physical distress.      Resistance Training   Training Prescription  Yes    Weight  3    Reps  10-15      NuStep    Level  1    SPM  127    Minutes  15    METs  4.9      Recumbant Elliptical   Level  1    RPM  73    Watts  108    Minutes  20    METs  5.8       Nutrition:  Target Goals: Understanding of nutrition guidelines, daily intake of sodium 1500mg , cholesterol 200mg , calories 30% from fat and 7% or less from saturated fats, daily to have 5 or more servings of fruits and vegetables.  Biometrics: Pre Biometrics - 10/22/17 1433      Pre Biometrics   Height  5\' 7"  (1.702 m)    Weight  191 lb 4.8 oz (86.8 kg)    Waist Circumference  39.5 inches    Hip Circumference  38.5 inches    Waist to Hip Ratio  1.03 %    BMI (Calculated)  29.95    Triceps Skinfold  6 mm    % Body Fat  24.8 %    Grip Strength  73.46 kg    Flexibility  15 in    Single Leg Stand  5 seconds        Nutrition Therapy Plan and Nutrition Goals: Nutrition Therapy & Goals - 11/20/17 0734      Nutrition Therapy   RD appointment deferred  Yes      Personal Nutrition Goals   Personal Goal #2  Patient says he is trying to eat heart healthy.     Additional Goals?  No       Nutrition Assessments: Nutrition Assessments - 10/22/17 1420      MEDFICTS Scores   Pre Score  58       Nutrition Goals Re-Evaluation:   Nutrition Goals Discharge (Final Nutrition Goals Re-Evaluation):   Psychosocial: Target Goals: Acknowledge presence or absence of significant depression and/or stress, maximize coping skills, provide positive support system. Participant is able to verbalize types and ability to use techniques and skills needed for reducing stress and depression.  Initial Review & Psychosocial Screening: Initial Psych Review & Screening - 10/22/17 1424      Initial Review   Current issues with  None Identified      Family Dynamics   Good Support System?  Yes      Barriers   Psychosocial barriers to participate in program  There are no identifiable barriers or psychosocial needs.      Screening Interventions    Interventions  Encouraged to exercise       Quality of Life Scores: Quality of Life - 10/22/17 1243      Quality of Life Scores   Health/Function Pre  26.3 %    Socioeconomic Pre  23.31 %    Psych/Spiritual Pre  21.71 %    Family Pre  25.2 %    GLOBAL Pre  24.54 %      Scores  of 19 and below usually indicate a poorer quality of life in these areas.  A difference of  2-3 points is a clinically meaningful difference.  A difference of 2-3 points in the total score of the Quality of Life Index has been associated with significant improvement in overall quality of life, self-image, physical symptoms, and general health in studies assessing change in quality of life.  PHQ-9: Recent Review Flowsheet Data    Depression screen [facility] 2/9 10/22/2017 02/07/2012   Decreased Interest 0 0   Down, Depressed, Hopeless 0 0   PHQ - 2 Score 0 0   Altered sleeping 1 -   Tired, decreased energy 1 -   Change in appetite 1 -   Feeling bad or failure about yourself  0 -   Trouble concentrating 1 -   Moving slowly or fidgety/restless 0 -   Suicidal thoughts 0 -   PHQ-9 Score 4 -   Difficult doing work/chores Not difficult at all -     Interpretation of Total Score  Total Score Depression Severity:  1-4 = Minimal depression, 5-9 = Mild depression, 10-14 = Moderate depression, 15-19 = Moderately severe depression, 20-27 = Severe depression   Psychosocial Evaluation and Intervention: Psychosocial Evaluation - 10/22/17 1424      Psychosocial Evaluation & Interventions   Interventions  Encouraged to exercise with the program and follow exercise prescription    Continue Psychosocial Services   No Follow up required       Psychosocial Re-Evaluation: Psychosocial Re-Evaluation    [place] Name 11/20/17 0737             Psychosocial Re-Evaluation   Current issues with  None Identified       Comments  Patients initial QOL score was 24.54 and his PHQ-9 score was 4 with no psychosocial issues  identified.       Expected Outcomes  Patient will have no psychosocial issues identified at discharge.        Interventions  Relaxation education;Encouraged to attend Cardiac Rehabilitation for the exercise;Stress management education       Continue Psychosocial Services   No Follow up required          Psychosocial Discharge (Final Psychosocial Re-Evaluation): Psychosocial Re-Evaluation - 11/20/17 0737      Psychosocial Re-Evaluation   Current issues with  None Identified    Comments  Patients initial QOL score was 24.54 and his PHQ-9 score was 4 with no psychosocial issues identified.    Expected Outcomes  Patient will have no psychosocial issues identified at discharge.     Interventions  Relaxation education;Encouraged to attend Cardiac Rehabilitation for the exercise;Stress management education    Continue Psychosocial Services   No Follow up required       Vocational Rehabilitation: Provide vocational rehab assistance to qualifying candidates.   Vocational Rehab Evaluation & Intervention: Vocational Rehab - 10/22/17 1416      Initial Vocational Rehab Evaluation & Intervention   Assessment shows need for Vocational Rehabilitation  No       Education: Education Goals: Education classes will be provided on a weekly basis, covering required topics. Participant will state understanding/return demonstration of topics presented.  Learning Barriers/Preferences: Learning Barriers/Preferences - 10/22/17 1416      Learning Barriers/Preferences   Learning Barriers  None    Learning Preferences  Audio;Computer/Internet;Group Instruction;Individual Instruction;Pictoral;Skilled Demonstration;Verbal Instruction;Video;Written Material       Education Topics: Hypertension, Hypertension Reduction -Define heart disease and high blood pressure. Discus how high  blood pressure affects the body and ways to reduce high blood pressure.   Exercise and Your Heart -Discuss why it is  important to exercise, the FITT principles of exercise, normal and abnormal responses to exercise, and how to exercise safely.   Angina -Discuss definition of angina, causes of angina, treatment of angina, and how to decrease risk of having angina.   Cardiac Medications -Review what the following cardiac medications are used for, how they affect the body, and side effects that may occur when taking the medications.  Medications include Aspirin, Beta blockers, calcium channel blockers, ACE Inhibitors, angiotensin receptor blockers, diuretics, digoxin, and antihyperlipidemics.   Congestive Heart Failure -Discuss the definition of CHF, how to live with CHF, the signs and symptoms of CHF, and how keep track of weight and sodium intake.   Heart Disease and Intimacy -Discus the effect sexual activity has on the heart, how changes occur during intimacy as we age, and safety during sexual activity.   CARDIAC REHAB PHASE II EXERCISE from 11/13/2017 in [place]  Date  10/23/17  Educator  DC  Instruction Review Code  2- Demonstrated Understanding      Smoking Cessation / COPD -Discuss different methods to quit smoking, the health benefits of quitting smoking, and the definition of COPD.   CARDIAC REHAB PHASE II EXERCISE from 11/13/2017 in [place]  Date  10/30/17  Educator  [place]  Instruction Review Code  2- Demonstrated Understanding      Nutrition I: Fats -Discuss the types of cholesterol, what cholesterol does to the heart, and how cholesterol levels can be controlled.   CARDIAC REHAB PHASE II EXERCISE from 11/13/2017 in [place]  Date  11/06/17  Educator  DC  Instruction Review Code  2- Demonstrated Understanding      Nutrition II: Labels -Discuss the different components of food labels and how to read food label   CARDIAC REHAB PHASE II EXERCISE from 11/13/2017 in [place]  Date  11/13/17   Educator  Etheleen Mayhew  Instruction Review Code  2- Demonstrated Understanding      Heart Parts/Heart Disease and PAD -Discuss the anatomy of the heart, the pathway of blood circulation through the heart, and these are affected by heart disease.   Stress I: Signs and Symptoms -Discuss the causes of stress, how stress may lead to anxiety and depression, and ways to limit stress.   Stress II: Relaxation -Discuss different types of relaxation techniques to limit stress.   Warning Signs of Stroke / TIA -Discuss definition of a stroke, what the signs and symptoms are of a stroke, and how to identify when someone is having stroke.   Knowledge Questionnaire Score: Knowledge Questionnaire Score - 10/22/17 1416      Knowledge Questionnaire Score   Pre Score  24/24       Core Components/Risk Factors/Patient Goals at Admission: Personal Goals and Risk Factors at Admission - 10/22/17 1420      Core Components/Risk Factors/Patient Goals on Admission    Weight Management  Yes    Intervention  Weight Management/Obesity: Establish reasonable short term and long term weight goals.    Admit Weight  191 lb 4.8 oz (86.8 kg)    Goal Weight: Short Term  186 lb 4.8 oz (84.5 kg)    Goal Weight: Long Term  181 lb 4.8 oz (82.2 kg)    Expected Outcomes  Short Term: Continue to assess and modify interventions until short  term weight is achieved;Long Term: Adherence to nutrition and physical activity/exercise program aimed toward attainment of established weight goal    Personal Goal Other  Yes    Personal Goal  Build Stamina, Get in shap and stay in shape.     Intervention  Attend CR 3 x week and supplement with home exercise 2xweek.     Expected Outcomes  Reach personal goals.        Core Components/Risk Factors/Patient Goals Review:  Goals and Risk Factor Review    Row Name 11/20/17 0735             Core Components/Risk Factors/Patient Goals Review   Personal Goals Review  Weight  Management/Obesity Build stamina; get in shape; stay in shape.       Review  Patient has completed 13 sessions gaining 6 lbs since he started the program. He says he stills needs to work on his eating with portion sizes and making healthier choices. He says he does feel stronger since he started and like being in an exercise program. He hopes to get stronger and get in shape as he continues. Will continue to monitor for progress.        Expected Outcomes  Patient will continue to attend sessions and complete the program.           Core Components/Risk Factors/Patient Goals at Discharge (Final Review):  Goals and Risk Factor Review - 11/20/17 0735      Core Components/Risk Factors/Patient Goals Review   Personal Goals Review  Weight Management/Obesity Build stamina; get in shape; stay in shape.    Review  Patient has completed 13 sessions gaining 6 lbs since he started the program. He says he stills needs to work on his eating with portion sizes and making healthier choices. He says he does feel stronger since he started and like being in an exercise program. He hopes to get stronger and get in shape as he continues. Will continue to monitor for progress.     Expected Outcomes  Patient will continue to attend sessions and complete the program.        ITP Comments: ITP Comments    Row Name 10/22/17 1417 10/24/17 1005         ITP Comments  Mr. Ramnauth has been in our program 2 times before. He completed both times. He is very eager to get started again.   Patient is new to program. He has completed 2 sessions. Will continue to monitor for progress.          Comments: ITP 30 Day REVIEW Patient doing well in the program. Will continue to monitor for progress.

## 2017-11-22 ENCOUNTER — Encounter (HOSPITAL_COMMUNITY)
Admission: RE | Admit: 2017-11-22 | Discharge: 2017-11-22 | Disposition: A | Discharge status: Home / Self Care | Payer: Medicare Other | Source: Ambulatory Visit | Attending: Cardiology | Admitting: Cardiology

## 2017-11-22 DIAGNOSIS — Z951 Presence of aortocoronary bypass graft: Secondary | ICD-10-CM

## 2017-11-22 NOTE — Progress Notes (Signed)
Daily Session Note  Patient Details  Name: Eric Beltran. MRN: [id number] Date of Birth: [birth year] Referring Provider:     CARDIAC REHAB PHASE II ORIENTATION from 10/22/2017 in [place]  Referring Provider  Dr. Domenic Polite      Encounter Date: 11/22/2017  Check In: Session Check In - 11/22/17 1545      Check-In   Location  [facility]    Staff Present  Aundra Dubin, RN, BSN;Diane Coad, MS, EP, [facility], Exercise Physiologist    Supervising physician immediately available to respond to emergencies  See telemetry face sheet for immediately available MD    Medication changes reported      No    Fall or balance concerns reported     No    Warm-up and Cool-down  Performed as group-led instruction    Resistance Training Performed  Yes    VAD Patient?  No    PAD/SET Patient?  No      Pain Assessment   Currently in Pain?  No/denies    Pain Score  0-No pain    Multiple Pain Sites  No       Capillary Blood Glucose: No results found for this or any previous visit (from the past 24 hour(s)).    Social History   Tobacco Use  Smoking Status Never Smoker  Smokeless Tobacco Never Used  Tobacco Comment   tobacco use - no    Goals Met:  Independence with exercise equipment Exercise tolerated well No report of cardiac concerns or symptoms Strength training completed today  Goals Unmet:  Not Applicable  Comments: Check out 1645.   Dr. Kate Sable is Medical Director for [facility] Cardiac and Pulmonary Rehab.

## 2017-11-25 ENCOUNTER — Encounter (HOSPITAL_COMMUNITY)
Admission: RE | Admit: 2017-11-25 | Discharge: 2017-11-25 | Disposition: A | Discharge status: Home / Self Care | Payer: Medicare Other | Source: Ambulatory Visit | Attending: Cardiology | Admitting: Cardiology

## 2017-11-25 DIAGNOSIS — Z951 Presence of aortocoronary bypass graft: Secondary | ICD-10-CM | POA: Diagnosis not present

## 2017-11-25 NOTE — Progress Notes (Signed)
Daily Session Note  Patient Details  Name: Eric Beltran. MRN: [id number] Date of Birth: November 20, 1940 Referring Provider:     CARDIAC REHAB PHASE II ORIENTATION from 10/22/2017 in [place]  Referring Provider  Dr. Domenic Polite      Encounter Date: 11/25/2017  Check In: Session Check In - 11/25/17 1540      Check-In   Location  [facility]    Staff Present  Aundra Dubin, RN, BSN;Diane Coad, MS, EP, [facility], Exercise Physiologist    Supervising physician immediately available to respond to emergencies  See telemetry face sheet for immediately available MD    Medication changes reported      No    Fall or balance concerns reported     No    Warm-up and Cool-down  Performed as group-led instruction    Resistance Training Performed  Yes    VAD Patient?  No    PAD/SET Patient?  No      Pain Assessment   Currently in Pain?  No/denies    Pain Score  0-No pain    Multiple Pain Sites  No       Capillary Blood Glucose: No results found for this or any previous visit (from the past 24 hour(s)).    Social History   Tobacco Use  Smoking Status Never Smoker  Smokeless Tobacco Never Used  Tobacco Comment   tobacco use - no    Goals Met:  Independence with exercise equipment Exercise tolerated well No report of cardiac concerns or symptoms Strength training completed today  Goals Unmet:  Not Applicable  Comments: Check out 1645.   Dr. Kate Sable is Medical Director for [facility] Cardiac and Pulmonary Rehab.

## 2017-11-27 ENCOUNTER — Encounter (HOSPITAL_COMMUNITY)
Admission: RE | Admit: 2017-11-27 | Discharge: 2017-11-27 | Disposition: A | Discharge status: Home / Self Care | Payer: Medicare Other | Source: Ambulatory Visit | Attending: Cardiology | Admitting: Cardiology

## 2017-11-27 DIAGNOSIS — Z951 Presence of aortocoronary bypass graft: Secondary | ICD-10-CM | POA: Diagnosis not present

## 2017-11-27 NOTE — Progress Notes (Signed)
Daily Session Note  Patient Details  Name: Eric Beltran. MRN: [id number] Date of Birth: Aug 20, 1940 Referring Provider:     CARDIAC REHAB PHASE II ORIENTATION from 10/22/2017 in [place]  Referring Provider  Dr. Domenic Polite      Encounter Date: 11/27/2017  Check In: Session Check In - 11/27/17 1545      Check-In   Location  [facility]    Staff Present  Feliciana Narayan Angelina Pih, MS, EP, [facility], Exercise Physiologist;Debra Wynetta Emery, RN, BSN    Supervising physician immediately available to respond to emergencies  See telemetry face sheet for immediately available MD    Medication changes reported      No    Fall or balance concerns reported     No    Warm-up and Cool-down  Performed as group-led instruction    Resistance Training Performed  Yes    VAD Patient?  No    PAD/SET Patient?  No      Pain Assessment   Currently in Pain?  No/denies    Pain Score  0-No pain    Multiple Pain Sites  No       Capillary Blood Glucose: No results found for this or any previous visit (from the past 24 hour(s)).    Social History   Tobacco Use  Smoking Status Never Smoker  Smokeless Tobacco Never Used  Tobacco Comment   tobacco use - no    Goals Met:  Independence with exercise equipment Exercise tolerated well Personal goals reviewed No report of cardiac concerns or symptoms Strength training completed today  Goals Unmet:  Not Applicable  Comments: Check out: [place]   Dr. Kate Sable is Medical Director for [place] and Pulmonary Rehab.

## 2017-11-28 ENCOUNTER — Ambulatory Visit: Admit: 2017-11-28 | Discharge: 2017-11-29 | Payer: MEDICARE

## 2017-11-28 DIAGNOSIS — L638 Other alopecia areata: Secondary | ICD-10-CM | POA: Diagnosis not present

## 2017-11-28 DIAGNOSIS — L639 Alopecia areata, unspecified: Principal | ICD-10-CM

## 2017-11-29 ENCOUNTER — Encounter (HOSPITAL_COMMUNITY)
Admission: RE | Admit: 2017-11-29 | Discharge: 2017-11-29 | Disposition: A | Discharge status: Home / Self Care | Payer: Medicare Other | Source: Ambulatory Visit | Attending: Cardiology | Admitting: Cardiology

## 2017-11-29 DIAGNOSIS — Z951 Presence of aortocoronary bypass graft: Secondary | ICD-10-CM

## 2017-11-29 NOTE — Progress Notes (Signed)
Daily Session Note  Patient Details  Name: Eric Beltran. MRN: [id number] Date of Birth: August 17, 1940 Referring Provider:     CARDIAC REHAB PHASE II ORIENTATION from 10/22/2017 in [place]  Referring Provider  Dr. Domenic Polite      Encounter Date: 11/29/2017  Check In: Session Check In - 11/29/17 1537      Check-In   Location  [facility]    Staff Present  Diane Angelina Pih, MS, EP, [facility], Exercise Physiologist;Abhijay Morriss Wynetta Emery, RN, BSN    Supervising physician immediately available to respond to emergencies  See telemetry face sheet for immediately available MD    Medication changes reported      No    Fall or balance concerns reported     No    Warm-up and Cool-down  Performed as group-led instruction    Resistance Training Performed  Yes    VAD Patient?  No    PAD/SET Patient?  No      Pain Assessment   Currently in Pain?  No/denies    Pain Score  0-No pain    Multiple Pain Sites  No       Capillary Blood Glucose: No results found for this or any previous visit (from the past 24 hour(s)).    Social History   Tobacco Use  Smoking Status Never Smoker  Smokeless Tobacco Never Used  Tobacco Comment   tobacco use - no    Goals Met:  Independence with exercise equipment Exercise tolerated well No report of cardiac concerns or symptoms Strength training completed today  Goals Unmet:  Not Applicable  Comments: Check out 1645.   Dr. Kate Sable is Medical Director for [facility] Cardiac and Pulmonary Rehab.

## 2017-12-02 ENCOUNTER — Encounter (HOSPITAL_COMMUNITY)
Admission: RE | Admit: 2017-12-02 | Discharge: 2017-12-02 | Disposition: A | Discharge status: Home / Self Care | Payer: Medicare Other | Source: Ambulatory Visit | Attending: Cardiology | Admitting: Cardiology

## 2017-12-02 DIAGNOSIS — Z951 Presence of aortocoronary bypass graft: Secondary | ICD-10-CM

## 2017-12-02 NOTE — Progress Notes (Signed)
Daily Session Note  Patient Details  Name: Eric Beltran. MRN: [id number] Date of Birth: 20-Sep-1940 Referring Provider:     CARDIAC REHAB PHASE II ORIENTATION from 10/22/2017 in [place]  Referring Provider  Dr. Domenic Polite      Encounter Date: 12/02/2017  Check In: Session Check In - 12/02/17 1545      Check-In   Location  [facility]    Staff Present  Diane Angelina Pih, MS, EP, [facility], Exercise Physiologist;Zymiere Trostle Wynetta Emery, RN, BSN    Supervising physician immediately available to respond to emergencies  See telemetry face sheet for immediately available MD    Medication changes reported      No    Fall or balance concerns reported     No    Warm-up and Cool-down  Performed as group-led instruction    Resistance Training Performed  Yes    VAD Patient?  No    PAD/SET Patient?  No      Pain Assessment   Currently in Pain?  No/denies    Pain Score  0-No pain    Multiple Pain Sites  No       Capillary Blood Glucose: No results found for this or any previous visit (from the past 24 hour(s)).    Social History   Tobacco Use  Smoking Status Never Smoker  Smokeless Tobacco Never Used  Tobacco Comment   tobacco use - no    Goals Met:  Independence with exercise equipment Exercise tolerated well No report of cardiac concerns or symptoms Strength training completed today  Goals Unmet:  Not Applicable  Comments: Check out 1645.   Dr. Kate Sable is Medical Director for [facility] Cardiac and Pulmonary Rehab.

## 2017-12-04 ENCOUNTER — Encounter (HOSPITAL_COMMUNITY)
Admission: RE | Admit: 2017-12-04 | Discharge: 2017-12-04 | Disposition: A | Discharge status: Home / Self Care | Payer: Medicare Other | Source: Ambulatory Visit | Attending: Cardiology | Admitting: Cardiology

## 2017-12-04 DIAGNOSIS — Z951 Presence of aortocoronary bypass graft: Secondary | ICD-10-CM

## 2017-12-04 NOTE — Progress Notes (Signed)
Daily Session Note  Patient Details  Name: Eric Beltran. MRN: [id number] Date of Birth: 09/24/1940 Referring Provider:     CARDIAC REHAB PHASE II ORIENTATION from 10/22/2017 in [place]  Referring Provider  Dr. Domenic Polite      Encounter Date: 12/04/2017  Check In: Session Check In - 12/04/17 1534      Check-In   Location  [facility]    Staff Present  Diane Angelina Pih, MS, EP, [facility], Exercise Physiologist;Leonette Tischer Wynetta Emery, RN, BSN    Supervising physician immediately available to respond to emergencies  See telemetry face sheet for immediately available MD    Medication changes reported      No    Fall or balance concerns reported     No    Warm-up and Cool-down  Performed as group-led instruction    Resistance Training Performed  Yes    VAD Patient?  No    PAD/SET Patient?  No      Pain Assessment   Currently in Pain?  No/denies    Pain Score  0-No pain    Multiple Pain Sites  No       Capillary Blood Glucose: No results found for this or any previous visit (from the past 24 hour(s)).    Social History   Tobacco Use  Smoking Status Never Smoker  Smokeless Tobacco Never Used  Tobacco Comment   tobacco use - no    Goals Met:  Independence with exercise equipment Exercise tolerated well No report of cardiac concerns or symptoms Strength training completed today  Goals Unmet:  Not Applicable  Comments: Check out 1645.   Dr. Kate Sable is Medical Director for [facility] Cardiac and Pulmonary Rehab.

## 2017-12-06 ENCOUNTER — Encounter (HOSPITAL_COMMUNITY)
Admission: RE | Admit: 2017-12-06 | Discharge: 2017-12-06 | Disposition: A | Discharge status: Home / Self Care | Payer: Medicare Other | Source: Ambulatory Visit | Attending: Cardiology | Admitting: Cardiology

## 2017-12-06 DIAGNOSIS — Z951 Presence of aortocoronary bypass graft: Secondary | ICD-10-CM

## 2017-12-06 NOTE — Progress Notes (Signed)
Daily Session Note  Patient Details  Name: Eric Beltran. MRN: [id number] Date of Birth: 01-20-1941 Referring Provider:     CARDIAC REHAB PHASE II ORIENTATION from 10/22/2017 in [place]  Referring Provider  Dr. Domenic Polite      Encounter Date: 12/06/2017  Check In: Session Check In - 12/06/17 1545      Check-In   Supervising physician immediately available to respond to emergencies  See telemetry face sheet for immediately available MD    Location  [facility]    Staff Present  Russella Dar, MS, EP, [facility], Exercise Physiologist;Shalonda Sachse Wynetta Emery, RN, BSN    Medication changes reported      No    Fall or balance concerns reported     No    Warm-up and Cool-down  Performed as group-led instruction    Resistance Training Performed  Yes    VAD Patient?  No    PAD/SET Patient?  No      Pain Assessment   Currently in Pain?  No/denies    Pain Score  0-No pain    Multiple Pain Sites  No       Capillary Blood Glucose: No results found for this or any previous visit (from the past 24 hour(s)).    Social History   Tobacco Use  Smoking Status Never Smoker  Smokeless Tobacco Never Used  Tobacco Comment   tobacco use - no    Goals Met:  Independence with exercise equipment Exercise tolerated well No report of cardiac concerns or symptoms Strength training completed today  Goals Unmet:  Not Applicable  Comments: Check out 1645.   Dr. Kate Sable is Medical Director for [facility] Cardiac and Pulmonary Rehab.

## 2017-12-08 ENCOUNTER — Other Ambulatory Visit: Payer: Self-pay | Admitting: Cardiology

## 2017-12-09 ENCOUNTER — Encounter (HOSPITAL_COMMUNITY)
Admission: RE | Admit: 2017-12-09 | Discharge: 2017-12-09 | Disposition: A | Discharge status: Home / Self Care | Payer: Medicare Other | Source: Ambulatory Visit | Attending: Cardiology | Admitting: Cardiology

## 2017-12-09 DIAGNOSIS — Z951 Presence of aortocoronary bypass graft: Secondary | ICD-10-CM | POA: Diagnosis not present

## 2017-12-09 NOTE — Progress Notes (Signed)
Daily Session Note  Patient Details  Name: Daquarius Dubeau. MRN: [id number] Date of Birth: 1941/03/18 Referring Provider:     CARDIAC REHAB PHASE II ORIENTATION from 10/22/2017 in [place]  Referring Provider  Dr. Domenic Polite      Encounter Date: 12/09/2017  Check In: Session Check In - 12/09/17 1530      Check-In   Supervising physician immediately available to respond to emergencies  See telemetry face sheet for immediately available MD    Location  [facility]    Staff Present  Russella Dar, MS, EP, [facility], Exercise Physiologist    Medication changes reported      No    Fall or balance concerns reported     No    Warm-up and Cool-down  Performed as group-led instruction    Resistance Training Performed  Yes    VAD Patient?  No    PAD/SET Patient?  No      VAD patient   Has back up controller?  No      Pain Assessment   Currently in Pain?  No/denies    Pain Score  0-No pain    Multiple Pain Sites  No       Capillary Blood Glucose: No results found for this or any previous visit (from the past 24 hour(s)).    Social History   Tobacco Use  Smoking Status Never Smoker  Smokeless Tobacco Never Used  Tobacco Comment   tobacco use - no    Goals Met:  Independence with exercise equipment Exercise tolerated well Personal goals reviewed No report of cardiac concerns or symptoms Strength training completed today  Goals Unmet:  Not Applicable  Comments: Check out: 1630   Dr. Kate Sable is Medical Director for [place] and Pulmonary Rehab.

## 2017-12-10 ENCOUNTER — Ambulatory Visit
Admit: 2017-12-10 | Discharge: 2017-12-10 | Payer: MEDICARE | Attending: Nurse Practitioner | Primary: Nurse Practitioner

## 2017-12-10 ENCOUNTER — Ambulatory Visit: Admit: 2017-12-10 | Discharge: 2017-12-10 | Payer: MEDICARE | Attending: Urology | Primary: Urology

## 2017-12-10 ENCOUNTER — Ambulatory Visit: Admit: 2017-12-10 | Discharge: 2017-12-10 | Payer: MEDICARE

## 2017-12-10 DIAGNOSIS — R310 Gross hematuria: Secondary | ICD-10-CM | POA: Diagnosis not present

## 2017-12-10 DIAGNOSIS — N2889 Other specified disorders of kidney and ureter: Secondary | ICD-10-CM | POA: Diagnosis not present

## 2017-12-10 MED ORDER — FINASTERIDE 5 MG TABLET
ORAL_TABLET | Freq: Every day | ORAL | 12 refills | 0 days | Status: CP
Start: 2017-12-10 — End: 2018-11-12

## 2017-12-11 ENCOUNTER — Encounter (HOSPITAL_COMMUNITY)
Admission: RE | Admit: 2017-12-11 | Discharge: 2017-12-11 | Disposition: A | Discharge status: Home / Self Care | Payer: Medicare Other | Source: Ambulatory Visit | Attending: Cardiology | Admitting: Cardiology

## 2017-12-11 DIAGNOSIS — Z951 Presence of aortocoronary bypass graft: Secondary | ICD-10-CM | POA: Diagnosis not present

## 2017-12-11 NOTE — Progress Notes (Signed)
Daily Session Note  Patient Details  Name: Bolden Hagerman. MRN: [id number] Date of Birth: 1941/04/17 Referring Provider:     CARDIAC REHAB PHASE II ORIENTATION from 10/22/2017 in [place]  Referring Provider  Dr. Domenic Polite      Encounter Date: 12/11/2017  Check In: Session Check In - 12/11/17 1545      Check-In   Supervising physician immediately available to respond to emergencies  See telemetry face sheet for immediately available MD    Location  [facility]    Staff Present  Russella Dar, MS, EP, [facility], Exercise Physiologist;Other    Medication changes reported      No    Fall or balance concerns reported     No    Warm-up and Cool-down  Performed as group-led instruction    Resistance Training Performed  Yes    VAD Patient?  No    PAD/SET Patient?  No      VAD patient   Has back up controller?  No    Has spare charged batteries?  No    Has battery cables?  No    Has compatible battery clips?  No    Comments  Does not have LVAD      Pain Assessment   Currently in Pain?  No/denies    Pain Score  0-No pain    Multiple Pain Sites  No       Capillary Blood Glucose: No results found for this or any previous visit (from the past 24 hour(s)).    Social History   Tobacco Use  Smoking Status Never Smoker  Smokeless Tobacco Never Used  Tobacco Comment   tobacco use - no    Goals Met:  Independence with exercise equipment Exercise tolerated well Personal goals reviewed No report of cardiac concerns or symptoms Strength training completed today  Goals Unmet:  Not Applicable  Comments: Check out: 4:45   Dr. Kate Sable is Medical Director for [place] and Pulmonary Rehab.

## 2017-12-13 ENCOUNTER — Other Ambulatory Visit: Payer: Self-pay | Admitting: *Deleted

## 2017-12-13 ENCOUNTER — Encounter (HOSPITAL_COMMUNITY): Payer: Medicare Other

## 2017-12-13 MED ORDER — ATORVASTATIN CALCIUM 40 MG PO TABS: 40.0000 mg | ORAL_TABLET | Freq: Every day | ORAL | 3 refills | Status: DC

## 2017-12-13 MED ORDER — LISINOPRIL 2.5 MG PO TABS: 2.5000 mg | ORAL_TABLET | Freq: Every day | ORAL | 3 refills | Status: DC

## 2017-12-16 ENCOUNTER — Encounter (HOSPITAL_COMMUNITY)
Admission: RE | Admit: 2017-12-16 | Discharge: 2017-12-16 | Disposition: A | Discharge status: Home / Self Care | Payer: Medicare Other | Source: Ambulatory Visit | Attending: Cardiology | Admitting: Cardiology

## 2017-12-16 DIAGNOSIS — N400 Enlarged prostate without lower urinary tract symptoms: Secondary | ICD-10-CM | POA: Insufficient documentation

## 2017-12-16 DIAGNOSIS — M1990 Unspecified osteoarthritis, unspecified site: Secondary | ICD-10-CM | POA: Diagnosis not present

## 2017-12-16 DIAGNOSIS — Z7982 Long term (current) use of aspirin: Secondary | ICD-10-CM | POA: Insufficient documentation

## 2017-12-16 DIAGNOSIS — Z8679 Personal history of other diseases of the circulatory system: Secondary | ICD-10-CM | POA: Diagnosis not present

## 2017-12-16 DIAGNOSIS — I2510 Atherosclerotic heart disease of native coronary artery without angina pectoris: Secondary | ICD-10-CM | POA: Insufficient documentation

## 2017-12-16 DIAGNOSIS — I110 Hypertensive heart disease with heart failure: Secondary | ICD-10-CM | POA: Insufficient documentation

## 2017-12-16 DIAGNOSIS — E782 Mixed hyperlipidemia: Secondary | ICD-10-CM | POA: Diagnosis not present

## 2017-12-16 DIAGNOSIS — I5032 Chronic diastolic (congestive) heart failure: Secondary | ICD-10-CM | POA: Insufficient documentation

## 2017-12-16 DIAGNOSIS — Z951 Presence of aortocoronary bypass graft: Secondary | ICD-10-CM

## 2017-12-16 DIAGNOSIS — I252 Old myocardial infarction: Secondary | ICD-10-CM | POA: Insufficient documentation

## 2017-12-16 DIAGNOSIS — Z79899 Other long term (current) drug therapy: Secondary | ICD-10-CM | POA: Insufficient documentation

## 2017-12-16 NOTE — Progress Notes (Signed)
Daily Session Note  Patient Details  Name: Eric Beltran. MRN: [id number] Date of Birth: 28-Nov-1940 Referring Provider:     CARDIAC REHAB PHASE II ORIENTATION from 10/22/2017 in [place]  Referring Provider  Dr. Domenic Polite      Encounter Date: 12/16/2017  Check In: Session Check In - 12/16/17 1545      Check-In   Supervising physician immediately available to respond to emergencies  See telemetry face sheet for immediately available MD    Location  [facility]    Staff Present  Eric Dar, MS, EP, [facility], Exercise Physiologist    Medication changes reported      No    Fall or balance concerns reported     No    Warm-up and Cool-down  Performed as group-led instruction    Resistance Training Performed  Yes    VAD Patient?  No    PAD/SET Patient?  No      VAD patient   Has back up controller?  No    Has spare charged batteries?  No    Has battery cables?  No    Has compatible battery clips?  No      Pain Assessment   Currently in Pain?  No/denies    Pain Score  0-No pain    Multiple Pain Sites  No       Capillary Blood Glucose: No results found for this or any previous visit (from the past 24 hour(s)).    Social History   Tobacco Use  Smoking Status Never Smoker  Smokeless Tobacco Never Used  Tobacco Comment   tobacco use - no    Goals Met:  Independence with exercise equipment Exercise tolerated well Personal goals reviewed No report of cardiac concerns or symptoms Strength training completed today  Goals Unmet:  Not Applicable  Comments: Check out: Eric Beltran   Dr. Kate Sable is Medical Director for [place] and Pulmonary Rehab.

## 2017-12-17 ENCOUNTER — Other Ambulatory Visit: Payer: Self-pay | Admitting: Cardiology

## 2017-12-18 ENCOUNTER — Encounter (HOSPITAL_COMMUNITY)
Admission: RE | Admit: 2017-12-18 | Discharge: 2017-12-18 | Disposition: A | Discharge status: Home / Self Care | Payer: Medicare Other | Source: Ambulatory Visit | Attending: Cardiology | Admitting: Cardiology

## 2017-12-18 DIAGNOSIS — Z951 Presence of aortocoronary bypass graft: Secondary | ICD-10-CM | POA: Diagnosis not present

## 2017-12-18 NOTE — Progress Notes (Signed)
Daily Session Note  Patient Details  Name: Eric Beltran. MRN: [id number] Date of Birth: 17-Jul-1940 Referring Provider:     CARDIAC REHAB PHASE II ORIENTATION from 10/22/2017 in [place]  Referring Provider  Dr. Domenic Polite      Encounter Date: 12/18/2017  Check In: Session Check In - 12/18/17 1545      Check-In   Supervising physician immediately available to respond to emergencies  See telemetry face sheet for immediately available MD    Location  [facility]    Staff Present  Russella Dar, MS, EP, [facility], Exercise Physiologist;Mirel Hundal Wynetta Emery, RN, BSN    Medication changes reported      No    Fall or balance concerns reported     No    Warm-up and Cool-down  Performed as group-led instruction    Resistance Training Performed  Yes    VAD Patient?  No    PAD/SET Patient?  No      Pain Assessment   Currently in Pain?  No/denies    Pain Score  0-No pain    Multiple Pain Sites  No       Capillary Blood Glucose: No results found for this or any previous visit (from the past 24 hour(s)).    Social History   Tobacco Use  Smoking Status Never Smoker  Smokeless Tobacco Never Used  Tobacco Comment   tobacco use - no    Goals Met:  Independence with exercise equipment Exercise tolerated well No report of cardiac concerns or symptoms Strength training completed today  Goals Unmet:  Not Applicable  Comments: Check out 1645.   Dr. Kate Sable is Medical Director for [facility] Cardiac and Pulmonary Rehab.

## 2017-12-18 NOTE — Progress Notes (Signed)
Cardiac Individual Treatment Plan  Patient Details  Name: Eric Beltran. MRN: [id number] Date of Birth: [birth year] Referring Provider:     CARDIAC REHAB PHASE II ORIENTATION from 10/22/2017 in [facility]  Referring Provider  Dr. Domenic Polite      Initial Encounter Date:    CARDIAC REHAB PHASE II ORIENTATION from 10/22/2017 in [facility]  Date  10/22/17      Visit Diagnosis: S/P CABG x 4  Patient's Home Medications on Admission:  Current Outpatient Medications:  .  amiodarone (PACERONE) 200 MG tablet, TAKE 1/2 TABLET BY MOUTH DAILY (CUT IN HALF FOR PATIENT), Disp: 45 tablet, Rfl: 6 .  aspirin EC 81 MG tablet, Take 81 mg by mouth daily., Disp: , Rfl:  .  atorvastatin (LIPITOR) 40 MG tablet, Take 1 tablet (40 mg total) by mouth at bedtime., Disp: 90 tablet, Rfl: 3 .  furosemide (LASIX) 20 MG tablet, Take 2 tablets (40 mg total) by mouth daily. (Patient taking differently: Take 40 mg by mouth daily as needed for fluid. ), Disp: 180 tablet, Rfl: 3 .  lisinopril (PRINIVIL,ZESTRIL) 2.5 MG tablet, Take 1 tablet (2.5 mg total) by mouth daily., Disp: 90 tablet, Rfl: 3 .  metoprolol tartrate (LOPRESSOR) 25 MG tablet, TAKE 1 TABLET BY MOUTH TWICE DAILY, Disp: 60 tablet, Rfl: 6 .  Multiple Vitamin (MULTIVITAMIN) tablet, Take 1 tablet by mouth at bedtime. , Disp: , Rfl:  .  Tamsulosin HCl (FLOMAX) 0.4 MG CAPS, Take 0.4 mg by mouth daily. , Disp: , Rfl:   Past Medical History: Past Medical History:  Diagnosis Date  . Arthritis   . Ascending aortic aneurysm [facility])    Status post repair 12/2011 at [facility]  . BPH (benign prostatic hyperplasia)   . Chronic diastolic CHF (congestive heart failure) ([place])   . Coronary atherosclerosis of native coronary artery    a. Multivessel status post prior stenting and ultimately CABG 12/2011 at [facility] with with LIMA to LAD, SVG to PLB, SVG to ramus, SVG to OM . b. Last cath 06/2014 (following ischemic nuc) -> medical  therapy, no feasible way to revascularize LCx territory.  . Essential hypertension   . Mixed hyperlipidemia   . Myocardial infarction ([place])   . Postoperative atrial fibrillation ([place])   . Supraventricular tachycardia ([place])     Tobacco Use: Social History   Tobacco Use  Smoking Status Never Smoker  Smokeless Tobacco Never Used  Tobacco Comment   tobacco use - no    Labs: Recent Review Flowsheet Data    Labs for ITP Cardiac and Pulmonary Rehab Latest Ref Rng & Units 08/06/2017 08/07/2017 08/07/2017 08/07/2017 08/08/2017   Cholestrol 0 - 200 mg/dL - - - - -   LDLCALC 0 - 99 mg/dL - - - - -   HDL >40 mg/dL - - - - -   Trlycerides <150 mg/dL - - - - -   Hemoglobin A1c 4.8 - 5.6 % - - - - -   PHART 7.350 - 7.450 - 7.388 7.349(L) - -   PCO2ART 32.0 - 48.0 mmHg - 38.5 42.6 - -   HCO3 20.0 - 28.0 mmol/L - 23.1 23.3 - -   TCO2 22 - 32 mmol/L 22 24 25 22 26    ACIDBASEDEF 0.0 - 2.0 mmol/L - 2.0 2.0 - -   O2SAT % - 98.0 93.0 - -      Capillary Blood Glucose: Lab Results  Component Value Date   GLUCAP 114 (H)  08/08/2017   GLUCAP 139 (H) 08/08/2017   GLUCAP 152 (H) 08/07/2017   GLUCAP 151 (H) 08/07/2017   GLUCAP 120 (H) 08/07/2017     Exercise Target Goals:    Exercise Program Goal: Individual exercise prescription set using results from initial 6 min walk test and THRR while considering  patient's activity barriers and safety.   Exercise Prescription Goal: Starting with aerobic activity 30 plus minutes a day, 3 days per week for initial exercise prescription. Provide home exercise prescription and guidelines that participant acknowledges understanding prior to discharge.  Activity Barriers & Risk Stratification: Activity Barriers & Cardiac Risk Stratification - 10/22/17 1432      Activity Barriers & Cardiac Risk Stratification   Activity Barriers  None    Cardiac Risk Stratification  High       6 Minute Walk: 6 Minute Walk    Row Name 10/22/17 1431         6 Minute  Walk   Phase  Initial     Distance  1400 feet     Distance % Change  0 %     Distance Feet Change  0 ft     Walk Time  6 minutes     # of Rest Breaks  0     MPH  2.65     METS  3.03     RPE  11     Perceived Dyspnea   11     VO2 Peak  9.26     Symptoms  No     Resting HR  63 bpm     Resting BP  122/60     Resting Oxygen Saturation   94 %     Exercise Oxygen Saturation  during 6 min walk  91 %     Max Ex. HR  86 bpm     Max Ex. BP  150/66     2 Minute Post BP  124/62        Oxygen Initial Assessment:   Oxygen Re-Evaluation:   Oxygen Discharge (Final Oxygen Re-Evaluation):   Initial Exercise Prescription: Initial Exercise Prescription - 10/22/17 1400      Date of Initial Exercise RX and Referring Provider   Date  10/22/17    Referring Provider  Dr. Domenic Polite      NuStep   Level  1    SPM  73    Minutes  15    METs  2.3      Recumbant Elliptical   Level  1    RPM  51    Watts  64    Minutes  20    METs  3.6      Prescription Details   Frequency (times per week)  3    Duration  Progress to 30 minutes of continuous aerobic without signs/symptoms of physical distress      Intensity   THRR 40-80% of Max Heartrate  96-112-129    Ratings of Perceived Exertion  11-13    Perceived Dyspnea  0-4      Progression   Progression  Continue progressive overload as per policy without signs/symptoms or physical distress.      Resistance Training   Training Prescription  Yes    Weight  1    Reps  10-15       Perform Capillary Blood Glucose checks as needed.  Exercise Prescription Changes:  Exercise Prescription Changes    Row Name 10/28/17 1400 11/11/17 1500 11/30/17 1200 12/12/17  1700       Response to Exercise   Blood Pressure (Admit)  160/70  132/60  138/68  162/70    Blood Pressure (Exercise)  206/84  180/78  160/72  200/70    Blood Pressure (Exit)  160/72  122/60  130/74  138/76    Heart Rate (Admit)  69 bpm  66 bpm  68 bpm  64 bpm    Heart Rate  (Exercise)  97 bpm  90 bpm  93 bpm  91 bpm    Heart Rate (Exit)  78 bpm  54 bpm  77 bpm  73 bpm    Rating of Perceived Exertion (Exercise)  12  13  13  12     Duration  Progress to 30 minutes of  aerobic without signs/symptoms of physical distress  Progress to 30 minutes of  aerobic without signs/symptoms of physical distress  Progress to 30 minutes of  aerobic without signs/symptoms of physical distress  Progress to 30 minutes of  aerobic without signs/symptoms of physical distress    Intensity  THRR New 99-114-130  THRR New 98-113-129  THRR New 102-119-136  THRR New 96-112-128      Progression   Progression  Continue to progress workloads to maintain intensity without signs/symptoms of physical distress.  Continue to progress workloads to maintain intensity without signs/symptoms of physical distress.  Continue to progress workloads to maintain intensity without signs/symptoms of physical distress.  Continue to progress workloads to maintain intensity without signs/symptoms of physical distress.    Average METs  -  -  5.65  5.15      Resistance Training   Training Prescription  Yes  Yes  Yes  Yes    Weight  1  3  4  5     Reps  10-15  10-15  10-15  10-15    Time  -  -  5 Minutes  5 Minutes      NuStep   Level  1  1  2  2     SPM  111  127  129  129    Minutes  15  15  15  15     METs  3.4  4.9  4.8  5      Recumbant Elliptical   Level  1  1  1  2     RPM  70  73  78  72    Watts  101  108  122  98    Minutes  20  20  20  20     METs  5.5  5.8  6.5  5.3      Home Exercise Plan   Plans to continue exercise at  -  -  Home (comment)  Home (comment)    Frequency  -  -  Add 2 additional days to program exercise sessions.  Add 2 additional days to program exercise sessions.    Initial Home Exercises Provided  -  -  11/01/17  11/01/17       Exercise Comments:  Exercise Comments    Row Name 10/28/17 1452 11/11/17 1510 12/12/17 1739       Exercise Comments  Patient has just started CR and  will be progressed in time.   Patient has been doing very well in CR. patient has increase his dumbbell size to 3lbs and has also increased the RPMs and SPMs on his machines by a high amount. Patient will continued to be monitored in CR.  Patient has done well so far in the program. Enjoys coming and feels the fellowship is as important as the exercise.         Exercise Goals and Review:  Exercise Goals    Row Name 10/22/17 1433             Exercise Goals   Increase Physical Activity  Yes       Intervention  Provide advice, education, support and counseling about physical activity/exercise needs.;Develop an individualized exercise prescription for aerobic and resistive training based on initial evaluation findings, risk stratification, comorbidities and participant's personal goals.       Expected Outcomes  Short Term: Attend rehab on a regular basis to increase amount of physical activity.       Increase Strength and Stamina  Yes       Intervention  Provide advice, education, support and counseling about physical activity/exercise needs.;Develop an individualized exercise prescription for aerobic and resistive training based on initial evaluation findings, risk stratification, comorbidities and participant's personal goals.       Expected Outcomes  Short Term: Increase workloads from initial exercise prescription for resistance, speed, and METs.       Able to understand and use rate of perceived exertion (RPE) scale  Yes       Intervention  Provide education and explanation on how to use RPE scale       Expected Outcomes  Short Term: Able to use RPE daily in rehab to express subjective intensity level;Long Term:  Able to use RPE to guide intensity level when exercising independently       Able to understand and use Dyspnea scale  Yes       Intervention  Provide education and explanation on how to use Dyspnea scale       Expected Outcomes  Short Term: Able to use Dyspnea scale daily in  rehab to express subjective sense of shortness of breath during exertion;Long Term: Able to use Dyspnea scale to guide intensity level when exercising independently       Knowledge and understanding of Target Heart Rate Range (THRR)  Yes       Intervention  Provide education and explanation of THRR including how the numbers were predicted and where they are located for reference       Expected Outcomes  Short Term: Able to state/look up THRR;Long Term: Able to use THRR to govern intensity when exercising independently;Short Term: Able to use daily as guideline for intensity in rehab       Able to check pulse independently  Yes       Intervention  Provide education and demonstration on how to check pulse in carotid and radial arteries.;Review the importance of being able to check your own pulse for safety during independent exercise       Expected Outcomes  Short Term: Able to explain why pulse checking is important during independent exercise;Long Term: Able to check pulse independently and accurately       Understanding of Exercise Prescription  Yes       Intervention  Provide education, explanation, and written materials on patient's individual exercise prescription       Expected Outcomes  Short Term: Able to explain program exercise prescription;Long Term: Able to explain home exercise prescription to exercise independently          Exercise Goals Re-Evaluation : Exercise Goals Re-Evaluation    Row Name 11/15/17 1458 12/12/17 1736  Exercise Goal Re-Evaluation   Exercise Goals Review  Increase Physical Activity;Increase Strength and Stamina;Able to understand and use rate of perceived exertion (RPE) scale;Understanding of Exercise Prescription;Able to understand and use Dyspnea scale;Knowledge and understanding of Target Heart Rate Range (THRR);Improve claudication pain tolerance and improve walking ability;Able to check pulse independently  Increase Strength and Stamina       Comments  Patient has been doing well in CR. patient has stated to me that he has an increase of stamina and has gained some stamina from beginning CR. This has helped hom in tasks when he is not here in CR.   Patient has progressed at a steady rate. He has been able to handle the increases in weights and workloads on the equipment. Patient feels like hehas increase his stamina, that he is getting in shape and that his arms are becoming toned.       Expected Outcomes  Patient wishes to gain stamina back and get back into shape.   To continue to gain stamina and get in shape          Discharge Exercise Prescription (Final Exercise Prescription Changes): Exercise Prescription Changes - 12/12/17 1700      Response to Exercise   Blood Pressure (Admit)  162/70    Blood Pressure (Exercise)  200/70    Blood Pressure (Exit)  138/76    Heart Rate (Admit)  64 bpm    Heart Rate (Exercise)  91 bpm    Heart Rate (Exit)  73 bpm    Rating of Perceived Exertion (Exercise)  12    Duration  Progress to 30 minutes of  aerobic without signs/symptoms of physical distress    Intensity  THRR New 96-112-128      Progression   Progression  Continue to progress workloads to maintain intensity without signs/symptoms of physical distress.    Average METs  5.15      Resistance Training   Training Prescription  Yes    Weight  5    Reps  10-15    Time  5 Minutes      NuStep   Level  2    SPM  129    Minutes  15    METs  5      Recumbant Elliptical   Level  2    RPM  72    Watts  98    Minutes  20    METs  5.3      Home Exercise Plan   Plans to continue exercise at  Home (comment)    Frequency  Add 2 additional days to program exercise sessions.    Initial Home Exercises Provided  11/01/17       Nutrition:  Target Goals: Understanding of nutrition guidelines, daily intake of sodium 1500mg , cholesterol 200mg , calories 30% from fat and 7% or less from saturated fats, daily to have 5 or more  servings of fruits and vegetables.  Biometrics: Pre Biometrics - 10/22/17 1433      Pre Biometrics   Height  5\' 7"  (1.702 m)    Weight  191 lb 4.8 oz (86.8 kg)    Waist Circumference  39.5 inches    Hip Circumference  38.5 inches    Waist to Hip Ratio  1.03 %    BMI (Calculated)  29.95    Triceps Skinfold  6 mm    % Body Fat  24.8 %    Grip Strength  73.46 kg  Flexibility  15 in    Single Leg Stand  5 seconds        Nutrition Therapy Plan and Nutrition Goals: Nutrition Therapy & Goals - 12/18/17 1451      Nutrition Therapy   RD appointment deferred  Yes      Personal Nutrition Goals   Personal Goal #2  Patient says he is trying to eat heart healthy.     Additional Goals?  No       Nutrition Assessments: Nutrition Assessments - 10/22/17 1420      MEDFICTS Scores   Pre Score  58       Nutrition Goals Re-Evaluation:   Nutrition Goals Discharge (Final Nutrition Goals Re-Evaluation):   Psychosocial: Target Goals: Acknowledge presence or absence of significant depression and/or stress, maximize coping skills, provide positive support system. Participant is able to verbalize types and ability to use techniques and skills needed for reducing stress and depression.  Initial Review & Psychosocial Screening: Initial Psych Review & Screening - 10/22/17 1424      Initial Review   Current issues with  None Identified      Family Dynamics   Good Support System?  Yes      Barriers   Psychosocial barriers to participate in program  There are no identifiable barriers or psychosocial needs.      Screening Interventions   Interventions  Encouraged to exercise       Quality of Life Scores: Quality of Life - 10/22/17 1243      Quality of Life Scores   Health/Function Pre  26.3 %    Socioeconomic Pre  23.31 %    Psych/Spiritual Pre  21.71 %    Family Pre  25.2 %    GLOBAL Pre  24.54 %      Scores of 19 and below usually indicate a poorer quality of life in  these areas.  A difference of  2-3 points is a clinically meaningful difference.  A difference of 2-3 points in the total score of the Quality of Life Index has been associated with significant improvement in overall quality of life, self-image, physical symptoms, and general health in studies assessing change in quality of life.  PHQ-9: Recent Review Flowsheet Data    Depression screen [facility] 2/9 10/22/2017 02/07/2012   Decreased Interest 0 0   Down, Depressed, Hopeless 0 0   PHQ - 2 Score 0 0   Altered sleeping 1 -   Tired, decreased energy 1 -   Change in appetite 1 -   Feeling bad or failure about yourself  0 -   Trouble concentrating 1 -   Moving slowly or fidgety/restless 0 -   Suicidal thoughts 0 -   PHQ-9 Score 4 -   Difficult doing work/chores Not difficult at all -     Interpretation of Total Score  Total Score Depression Severity:  1-4 = Minimal depression, 5-9 = Mild depression, 10-14 = Moderate depression, 15-19 = Moderately severe depression, 20-27 = Severe depression   Psychosocial Evaluation and Intervention: Psychosocial Evaluation - 10/22/17 1424      Psychosocial Evaluation & Interventions   Interventions  Encouraged to exercise with the program and follow exercise prescription    Continue Psychosocial Services   No Follow up required       Psychosocial Re-Evaluation: Psychosocial Re-Evaluation    [place] Name 11/20/17 0737 12/18/17 1453           Psychosocial Re-Evaluation   Current issues with  None Identified  None Identified      Comments  Patients initial QOL score was 24.54 and his PHQ-9 score was 4 with no psychosocial issues identified.  Patients initial QOL score was 24.54 and his PHQ-9 score was 4 with no psychosocial issues identified.      Expected Outcomes  Patient will have no psychosocial issues identified at discharge.   Patient will have no psychosocial issues identified at discharge.       Interventions  Relaxation education;Encouraged to attend  Cardiac Rehabilitation for the exercise;Stress management education  Relaxation education;Encouraged to attend Cardiac Rehabilitation for the exercise;Stress management education      Continue Psychosocial Services   No Follow up required  No Follow up required         Psychosocial Discharge (Final Psychosocial Re-Evaluation): Psychosocial Re-Evaluation - 12/18/17 1453      Psychosocial Re-Evaluation   Current issues with  None Identified    Comments  Patients initial QOL score was 24.54 and his PHQ-9 score was 4 with no psychosocial issues identified.    Expected Outcomes  Patient will have no psychosocial issues identified at discharge.     Interventions  Relaxation education;Encouraged to attend Cardiac Rehabilitation for the exercise;Stress management education    Continue Psychosocial Services   No Follow up required       Vocational Rehabilitation: Provide vocational rehab assistance to qualifying candidates.   Vocational Rehab Evaluation & Intervention: Vocational Rehab - 10/22/17 1416      Initial Vocational Rehab Evaluation & Intervention   Assessment shows need for Vocational Rehabilitation  No       Education: Education Goals: Education classes will be provided on a weekly basis, covering required topics. Participant will state understanding/return demonstration of topics presented.  Learning Barriers/Preferences: Learning Barriers/Preferences - 10/22/17 1416      Learning Barriers/Preferences   Learning Barriers  None    Learning Preferences  Audio;Computer/Internet;Group Instruction;Individual Instruction;Pictoral;Skilled Demonstration;Verbal Instruction;Video;Written Material       Education Topics: Hypertension, Hypertension Reduction -Define heart disease and high blood pressure. Discus how high blood pressure affects the body and ways to reduce high blood pressure.   Exercise and Your Heart -Discuss why it is important to exercise, the FITT principles of  exercise, normal and abnormal responses to exercise, and how to exercise safely.   Angina -Discuss definition of angina, causes of angina, treatment of angina, and how to decrease risk of having angina.   Cardiac Medications -Review what the following cardiac medications are used for, how they affect the body, and side effects that may occur when taking the medications.  Medications include Aspirin, Beta blockers, calcium channel blockers, ACE Inhibitors, angiotensin receptor blockers, diuretics, digoxin, and antihyperlipidemics.   Congestive Heart Failure -Discuss the definition of CHF, how to live with CHF, the signs and symptoms of CHF, and how keep track of weight and sodium intake.   Heart Disease and Intimacy -Discus the effect sexual activity has on the heart, how changes occur during intimacy as we age, and safety during sexual activity.   CARDIAC REHAB PHASE II EXERCISE from 12/11/2017 in [place]  Date  10/23/17  Educator  DC  Instruction Review Code  2- Demonstrated Understanding      Smoking Cessation / COPD -Discuss different methods to quit smoking, the health benefits of quitting smoking, and the definition of COPD.   CARDIAC REHAB PHASE II EXERCISE from 12/11/2017 in [place]  Date  10/30/17  Educator  [place]  Instruction Review Code  2- Demonstrated Understanding      Nutrition I: Fats -Discuss the types of cholesterol, what cholesterol does to the heart, and how cholesterol levels can be controlled.   CARDIAC REHAB PHASE II EXERCISE from 12/11/2017 in [place]  Date  11/06/17  Educator  DC  Instruction Review Code  2- Demonstrated Understanding      Nutrition II: Labels -Discuss the different components of food labels and how to read food label   CARDIAC REHAB PHASE II EXERCISE from 12/11/2017 in [place]  Date  11/13/17  Educator  Etheleen Mayhew  Instruction Review  Code  2- Demonstrated Understanding      Heart Parts/Heart Disease and PAD -Discuss the anatomy of the heart, the pathway of blood circulation through the heart, and these are affected by heart disease.   CARDIAC REHAB PHASE II EXERCISE from 12/11/2017 in [place]  Date  11/20/17  Educator  DC  Instruction Review Code  2- Demonstrated Understanding      Stress I: Signs and Symptoms -Discuss the causes of stress, how stress may lead to anxiety and depression, and ways to limit stress.   CARDIAC REHAB PHASE II EXERCISE from 12/11/2017 in [place]  Date  11/27/17  Educator  D. Coad  Instruction Review Code  2- Demonstrated Understanding      Stress II: Relaxation -Discuss different types of relaxation techniques to limit stress.   CARDIAC REHAB PHASE II EXERCISE from 12/11/2017 in [place]  Date  12/04/17  Educator  D. Coad  Instruction Review Code  2- Demonstrated Understanding      Warning Signs of Stroke / TIA -Discuss definition of a stroke, what the signs and symptoms are of a stroke, and how to identify when someone is having stroke.   CARDIAC REHAB PHASE II EXERCISE from 12/11/2017 in [place]  Date  12/11/17  Educator  DC  Instruction Review Code  2- Demonstrated Understanding      Knowledge Questionnaire Score: Knowledge Questionnaire Score - 10/22/17 1416      Knowledge Questionnaire Score   Pre Score  24/24       Core Components/Risk Factors/Patient Goals at Admission: Personal Goals and Risk Factors at Admission - 10/22/17 1420      Core Components/Risk Factors/Patient Goals on Admission    Weight Management  Yes    Intervention  Weight Management/Obesity: Establish reasonable short term and long term weight goals.    Admit Weight  191 lb 4.8 oz (86.8 kg)    Goal Weight: Short Term  186 lb 4.8 oz (84.5 kg)    Goal Weight: Long Term  181 lb 4.8 oz (82.2 kg)     Expected Outcomes  Short Term: Continue to assess and modify interventions until short term weight is achieved;Long Term: Adherence to nutrition and physical activity/exercise program aimed toward attainment of established weight goal    Personal Goal Other  Yes    Personal Goal  Build Stamina, Get in shap and stay in shape.     Intervention  Attend CR 3 x week and supplement with home exercise 2xweek.     Expected Outcomes  Reach personal goals.        Core Components/Risk Factors/Patient Goals Review:  Goals and Risk Factor Review    Row Name 11/20/17 0735 12/18/17 1451           Core Components/Risk Factors/Patient  Goals Review   Personal Goals Review  Weight Management/Obesity Build stamina; get in shape; stay in shape.  Weight Management/Obesity Build stamina; get in shape; stay in shape.       Review  Patient has completed 13 sessions gaining 6 lbs since he started the program. He says he stills needs to work on his eating with portion sizes and making healthier choices. He says he does feel stronger since he started and like being in an exercise program. He hopes to get stronger and get in shape as he continues. Will continue to monitor for progress.   Patient has completed 24 sessions gaining 2 lbs since last 30 day review. He continues to do well in the program with progression. He says he feels like he is getting in shape and feels like his upper body is getting toned. He feels he has more stamina and energy and feels stronger and better overall. Will continue to monitor for progress.       Expected Outcomes  Patient will continue to attend sessions and complete the program.   Patient will continue to attend sessions and complete the program.          Core Components/Risk Factors/Patient Goals at Discharge (Final Review):  Goals and Risk Factor Review - 12/18/17 1451      Core Components/Risk Factors/Patient Goals Review   Personal Goals Review  Weight Management/Obesity Build  stamina; get in shape; stay in shape.     Review  Patient has completed 24 sessions gaining 2 lbs since last 30 day review. He continues to do well in the program with progression. He says he feels like he is getting in shape and feels like his upper body is getting toned. He feels he has more stamina and energy and feels stronger and better overall. Will continue to monitor for progress.     Expected Outcomes  Patient will continue to attend sessions and complete the program.        ITP Comments: ITP Comments    Row Name 10/22/17 1417 10/24/17 1005         ITP Comments  Mr. Ransom has been in our program 2 times before. He completed both times. He is very eager to get started again.   Patient is new to program. He has completed 2 sessions. Will continue to monitor for progress.          Comments: ITP 30 Day REVIEW Patient doing well in the program. Will continue to monitor for progress.

## 2017-12-20 ENCOUNTER — Encounter (HOSPITAL_COMMUNITY)
Admission: RE | Admit: 2017-12-20 | Discharge: 2017-12-20 | Disposition: A | Discharge status: Home / Self Care | Payer: Medicare Other | Source: Ambulatory Visit | Attending: Cardiology | Admitting: Cardiology

## 2017-12-20 DIAGNOSIS — Z951 Presence of aortocoronary bypass graft: Secondary | ICD-10-CM

## 2017-12-20 NOTE — Progress Notes (Signed)
Daily Session Note  Patient Details  Name: Eric L Fortunato Jr. MRN: [id number] Date of Birth: 02/22/1941 Referring Provider:     CARDIAC REHAB PHASE II ORIENTATION from 10/22/2017 in [place] [facility]  Referring Provider  Dr. McDowell      Encounter Date: 12/20/2017  Check In: Session Check In - 12/20/17 1545      Check-In   Supervising physician immediately available to respond to emergencies  See telemetry face sheet for immediately available MD    Location  [facility]    Staff Present  Diane Coad, MS, EP, [facility], Exercise Physiologist; , RN, BSN    Medication changes reported      No    Fall or balance concerns reported     No    Warm-up and Cool-down  Performed as group-led instruction    Resistance Training Performed  Yes    VAD Patient?  No    PAD/SET Patient?  No      Pain Assessment   Currently in Pain?  No/denies    Pain Score  0-No pain    Multiple Pain Sites  No       Capillary Blood Glucose: No results found for this or any previous visit (from the past 24 hour(s)).    Social History   Tobacco Use  Smoking Status Never Smoker  Smokeless Tobacco Never Used  Tobacco Comment   tobacco use - no    Goals Met:  Independence with exercise equipment Exercise tolerated well No report of cardiac concerns or symptoms Strength training completed today  Goals Unmet:  Not Applicable  Comments: Check out 1645.   Dr. Suresh Koneswaran is Medical Director for [place] [facility] and [facility]. 

## 2017-12-23 ENCOUNTER — Encounter (HOSPITAL_COMMUNITY)
Admission: RE | Admit: 2017-12-23 | Discharge: 2017-12-23 | Disposition: A | Discharge status: Home / Self Care | Payer: Medicare Other | Source: Ambulatory Visit | Attending: Cardiology | Admitting: Cardiology

## 2017-12-23 DIAGNOSIS — Z951 Presence of aortocoronary bypass graft: Secondary | ICD-10-CM | POA: Diagnosis not present

## 2017-12-23 NOTE — Progress Notes (Signed)
Daily Session Note  Patient Details  Name: Eric Beltran. MRN: [id number] Date of Birth: Dec 25, 1940 Referring Provider:     CARDIAC REHAB PHASE II ORIENTATION from 10/22/2017 in [place]  Referring Provider  Dr. Domenic Polite      Encounter Date: 12/23/2017  Check In: Session Check In - 12/23/17 1545      Check-In   Supervising physician immediately available to respond to emergencies  See telemetry face sheet for immediately available MD    Location  [facility]    Staff Present  Russella Dar, MS, EP, [facility], Exercise Physiologist;Adamarys Shall Wynetta Emery, RN, BSN    Medication changes reported      No    Fall or balance concerns reported     No    Warm-up and Cool-down  Performed as group-led instruction    Resistance Training Performed  Yes    VAD Patient?  No    PAD/SET Patient?  No      Pain Assessment   Currently in Pain?  No/denies    Pain Score  0-No pain    Multiple Pain Sites  No       Capillary Blood Glucose: No results found for this or any previous visit (from the past 24 hour(s)).    Social History   Tobacco Use  Smoking Status Never Smoker  Smokeless Tobacco Never Used  Tobacco Comment   tobacco use - no    Goals Met:  Independence with exercise equipment Exercise tolerated well No report of cardiac concerns or symptoms Strength training completed today  Goals Unmet:  Not Applicable  Comments: Check out 1645.   Dr. Kate Sable is Medical Director for [facility] Card[facility] and Pulmonary Rehab.

## 2017-12-24 NOTE — Progress Notes (Signed)
Cardiology Office Note  Date: 12/25/2017   ID: Eric Borer., DOB 1940/11/03, MRN [id number]  PCP: Sinda Du, MD  Primary Cardiologist: Rozann Lesches, MD   Chief Complaint  Patient presents with  . Coronary Artery Disease    History of Present Illness: Eric Soltys. is a 77 y.o. male last seen in June.  He presents to the office for follow-up, has had angina symptoms and elevated blood pressure during cardiac rehabilitation.  He reports compliance with his medications, and actually has been feeling well overall.  We went over his current regimen and discussed increasing lisinopril from 2.5 mg daily to 5 mg daily.  He does not report any palpitations or shortness of breath.  Past Medical History:  Diagnosis Date  . Arthritis   . Ascending aortic aneurysm [facility])    Status post repair 12/2011 at [facility]  . BPH (benign prostatic hyperplasia)   . Chronic diastolic CHF (congestive heart failure) ([place])   . Coronary atherosclerosis of native coronary artery    a. Multivessel status post prior stenting and ultimately CABG 12/2011 at [facility] with with LIMA to LAD, SVG to PLB, SVG to ramus, SVG to OM . b. Last cath 06/2014 (following ischemic nuc) -> medical therapy, no feasible way to revascularize LCx territory.  . Essential hypertension   . Mixed hyperlipidemia   . Myocardial infarction ([place])   . Postoperative atrial fibrillation ([place])   . Supraventricular tachycardia [facility])     Past Surgical History:  Procedure Laterality Date  . ASCENDING AORTIC ANEURYSM REPAIR  12/2011 [facility]   26 mm.Dacron graft  . CORONARY ARTERY BYPASS GRAFT  12/2011 [facility]   LIMA to LAD, SVG to PLB, SVG to ramus, SVG to OM  . CORONARY ARTERY BYPASS GRAFT N/A 08/06/2017   Procedure: REDO CORONARY ARTERY BYPASS GRAFTING (CABG) TIMES FOUR UTILIZING LEFT GREATER SAPHENOUS VEIN HAVESTED ENDOVASCULARLY, LEFT RADIAL ARTERY.  RIGHT AXILLARY CANNULATION;  Surgeon: Gaye Pollack, MD;  Location: [place];   Service: Open Heart Surgery;  Laterality: N/A;  . HERNIA REPAIR    . LEFT HEART CATH AND CORS/GRAFTS ANGIOGRAPHY N/A 07/11/2017   Procedure: LEFT HEART CATH AND CORS/GRAFTS ANGIOGRAPHY;  Surgeon: Troy Sine, MD;  Location: [place] CV LAB;  Service: Cardiovascular;  Laterality: N/A;  . LEFT HEART CATHETERIZATION WITH CORONARY ANGIOGRAM N/A 06/18/2014   Procedure: LEFT HEART CATHETERIZATION WITH CORONARY ANGIOGRAM;  Surgeon: Blane Ohara, MD;  Location: [facility] CATH LAB;  Service: Cardiovascular;  Laterality: N/A;  . RADIAL ARTERY HARVEST Left 08/06/2017   Procedure: RADIAL ARTERY HARVEST;  Surgeon: Gaye Pollack, MD;  Location: [place];  Service: Open Heart Surgery;  Laterality: Left;  . TEE WITHOUT CARDIOVERSION N/A 08/06/2017   Procedure: TRANSESOPHAGEAL ECHOCARDIOGRAM (TEE);  Surgeon: Gaye Pollack, MD;  Location: [place];  Service: Open Heart Surgery;  Laterality: N/A;  . TOTAL HIP ARTHROPLASTY Right 02/07/2016   Procedure: RIGHT TOTAL HIP ARTHROPLASTY ANTERIOR APPROACH;  Surgeon: Mcarthur Rossetti, MD;  Location: [place];  Service: Orthopedics;  Laterality: Right;  . TRANSURETHRAL RESECTION OF PROSTATE      Current Outpatient Medications  Medication Sig Dispense Refill  . amiodarone (PACERONE) 200 MG tablet TAKE 1/2 TABLET BY MOUTH DAILY (CUT IN HALF FOR PATIENT) 45 tablet 6  . aspirin EC 81 MG tablet Take 81 mg by mouth daily.    [name] atorvastatin (LIPITOR) 40 MG tablet Take 1 tablet (40 mg total) by mouth at bedtime. 90 tablet 3  .  furosemide (LASIX) 20 MG tablet Take 2 tablets (40 mg total) by mouth daily. (Patient taking differently: Take 40 mg by mouth daily as needed for fluid. ) 180 tablet 3  . metoprolol tartrate (LOPRESSOR) 25 MG tablet TAKE 1 TABLET BY MOUTH TWICE DAILY 60 tablet 6  . Multiple Vitamin (MULTIVITAMIN) tablet Take 1 tablet by mouth at bedtime.     . Omega-3 Fatty Acids (FISH OIL) 1000 MG CAPS Take 1,000 capsules by mouth daily.    . Tamsulosin HCl (FLOMAX) 0.4 MG  CAPS Take 0.4 mg by mouth daily.     [name] lisinopril (PRINIVIL,ZESTRIL) 5 MG tablet Take 1 tablet (5 mg total) by mouth daily. 90 tablet 3   No current facility-administered medications for this visit.    Allergies:  Patient has no known allergies.   Social History: The patient  reports that he has never smoked. He has never used smokeless tobacco. He reports that he drinks alcohol. He reports that he does not use drugs.   ROS:  Please see the history of present illness. Otherwise, complete review of systems is positive for none.  All other systems are reviewed and negative.   Physical Exam: VS:  BP (!) 142/78 (BP Location: Left Arm)   Pulse 67   Ht 5\' 7"  (1.702 m)   Wt 195 lb (88.5 kg)   SpO2 94%   BMI 30.54 kg/m , BMI Body mass index is 30.54 kg/m.  Wt Readings from Last 3 Encounters:  12/25/17 195 lb (88.5 kg)  11/06/17 187 lb 12.8 oz (85.2 kg)  10/22/17 191 lb 4.8 oz (86.8 kg)    General: Patient appears comfortable at rest. HEENT: Conjunctiva and lids normal, oropharynx clear. Neck: Supple, no elevated JVP or carotid bruits, no thyromegaly. Lungs: Clear to auscultation, nonlabored breathing at rest. Cardiac: Regular rate and rhythm, no S3, 2/6 systolic murmur. Abdomen: Soft, nontender, bowel sounds present. Extremities: Trace ankle edema, distal pulses 2+. Skin: Warm and dry. Musculoskeletal: No kyphosis. Neuropsychiatric: Alert and oriented x3, affect grossly appropriate.  ECG: I personally reviewed the tracing from 08/30/2017 which showed sinus rhythm with prolonged PR interval and nonspecific T wave changes.  Recent Labwork: 03/05/2017: B Natriuretic Peptide 318.0; TSH 1.170 08/05/2017: ALT 20; AST 21 08/07/2017: Magnesium 2.4 08/09/2017: BUN 20; Creatinine, Ser 1.13; Hemoglobin 8.4; Platelets 138; Potassium 3.6; Sodium 134     Component Value Date/Time   CHOL 130 03/06/2017 0610   TRIG 262 (H) 03/06/2017 0610   HDL 28 (L) 03/06/2017 0610   CHOLHDL 4.6 03/06/2017  0610   VLDL 52 (H) 03/06/2017 0610   LDLCALC 50 03/06/2017 0610    Other Studies Reviewed Today:  Cardiac catheterization 07/11/2017:  Prox RCA to Mid RCA lesion is 30% stenosed.  Mid RCA lesion is 20% stenosed.  Post Atrio lesion is 95% stenosed.  Ost Cx to Prox Cx lesion is 100% stenosed.  Ost 1st Diag lesion is 90% stenosed.  Prox LAD-1 lesion is 80% stenosed.  Prox LAD-2 lesion is 70% stenosed.  The left ventricular ejection fraction is 50-55% by visual estimate.  The left ventricular systolic function is normal.  LV end diastolic pressure is mildly elevated.  Normal global LV function with ejection fraction at 50-55%.  Severe native CAD with 80% proximal calcified LAD stenosis in the region of the first diagonal vessel with 90% ostial first diagonal stenosis and 60-70% diffuse LAD stenosis after the second diagonal vessel with competitive filling beyond this from the LIMA graft; total ostial occlusion  of the left circumflex coronary artery at site of prior stenting. There is collateralization to 2 branches of the circumflex via the left injection. Large, very dominant RCA with diffuse calcification with 30% narrowing in the mid segment, a patent stent proximal to the acute margin with 20% intimal aplasia, and apparent significant calcific rocklike segment in the region just beyond the very large PDA takeoff and prior to to large inferior LV and posterior lateral branches.  Occlusion of the previous 3 saphenous vein conduits at its origin in the aorta.  Patent LIMA graft supplying the mid LAD.  RECOMMENDATION: Angiographic findings were reviewed with Dr. Burt Knack. It appears that the 90% very calcified "rock "in the dista RCA jeopardizes a very large distal RCA system. This may not be amenable to atherectomy and and would be very difficult to stent which would also potentially jail these large branch vessels. With his significant concomitant disease with total  occlusion of the circumflex and collateralized large branches that are good surgical targets and in addition to his proximal LAD and diagonal vessel stenosis outpatient surgical consultation will be initially obtained for consideration of redo CABG revascularization. Isosorbide will be added to his medical regimen.  TEE 08/06/2017: Left Ventricle Normal cavity size and wall thickness. LV systolic function is low normal with an EF of 50-55%. There are no obvious wall motion abnormalities. No thrombus present. No mass present.  Septum Normal atrial and ventricular septum with no septal defect and normal septal motion. No Patent Foramen Ovale present.  Left Atrium Left atrium normal in structure and function. No thrombus present. No left atrial appendage. No mass present. No ASD or PFO closure device in interatrial septum.  Aortic Valve The valve is trileaflet. Normal leaflet separation. No stenosis. Mild to moderate regurgitation. No AV vegetation.  Aorta No aneurysm present. Graft present in the ascending aorta. No aortic dissection present  Mitral Valve Normal valve structure. No leaflet thickening and calcification present. No stenosis. Normal leaflet mobility. Trace regurgitation. The jet direction is centrally-directed. No prolapse present. No vegetation present. No abscess present. No flail present.  Right Ventricle Normal cavity size, wall thickness and ejection fraction.  Right Atrium Normal right atrial size.  Tricuspid Valve Normal tricuspid valve structure. No stenosis. No regurgitation.  Pulmonic Valve Normal pulmonic valve structure. No prosthetic valve present. Trace regurgitation.    Assessment and Plan:  1.  Multivessel CAD status post redo CABG in March of this year.  He has had some angina symptoms and cardiac rehabilitation in the setting of hypertension.  Otherwise states that he feels well.  Plan is to increase lisinopril from 2.5 mg daily to 5 mg daily and continue to follow  blood pressure trend.  No other changes made today.  2.  Postoperative atrial fibrillation without obvious recurrences.  Continue observation.  Current medicines were reviewed with the patient today.  Disposition: Follow-up in 3 months.  Signed, Satira Sark, MD, [facility] 12/25/2017 2:12 PM    [facility] at [facility] [address] [place], [place] [address] Phone: [phone]; Fax: [phone]

## 2017-12-25 ENCOUNTER — Ambulatory Visit (INDEPENDENT_AMBULATORY_CARE_PROVIDER_SITE_OTHER): Payer: Medicare Other | Admitting: Cardiology

## 2017-12-25 ENCOUNTER — Encounter (HOSPITAL_COMMUNITY)
Admission: RE | Admit: 2017-12-25 | Discharge: 2017-12-25 | Disposition: A | Discharge status: Home / Self Care | Payer: Medicare Other | Source: Ambulatory Visit | Attending: Cardiology | Admitting: Cardiology

## 2017-12-25 ENCOUNTER — Encounter: Payer: Self-pay | Admitting: Cardiology

## 2017-12-25 VITALS — BP 142/78 | HR 67 | Ht 67.0 in | Wt 195.0 lb

## 2017-12-25 DIAGNOSIS — Z951 Presence of aortocoronary bypass graft: Secondary | ICD-10-CM

## 2017-12-25 DIAGNOSIS — I4891 Unspecified atrial fibrillation: Secondary | ICD-10-CM | POA: Diagnosis not present

## 2017-12-25 DIAGNOSIS — I9789 Other postprocedural complications and disorders of the circulatory system, not elsewhere classified: Secondary | ICD-10-CM | POA: Diagnosis not present

## 2017-12-25 DIAGNOSIS — I25709 Atherosclerosis of coronary artery bypass graft(s), unspecified, with unspecified angina pectoris: Secondary | ICD-10-CM | POA: Diagnosis not present

## 2017-12-25 MED ORDER — LISINOPRIL 5 MG PO TABS: 5.0000 mg | ORAL_TABLET | Freq: Every day | ORAL | 3 refills | Status: DC

## 2017-12-25 MED ORDER — TAMSULOSIN 0.4 MG CAPSULE
ORAL_CAPSULE | 1 refills | 0 days | Status: CP
Start: 2017-12-25 — End: 2018-06-09

## 2017-12-25 NOTE — Patient Instructions (Signed)
Medication Instructions:  Increase lisinopril to 5 mg daily   Labwork: none  Testing/Procedures: none  Follow-Up: Your physician recommends that you schedule a follow-up appointment in: 3 months    Any Other Special Instructions Will Be Listed Below (If Applicable).     If you need a refill on your cardiac medications before your next appointment, please call your pharmacy.

## 2017-12-25 NOTE — Progress Notes (Signed)
Daily Session Note  Patient Details  Name: Eric Beltran. MRN: [id number] Date of Birth: [birth year] Referring Provider:     CARDIAC REHAB PHASE II ORIENTATION from 10/22/2017 in [place]  Referring Provider  Dr. Domenic Polite      Encounter Date: 12/25/2017  Check In: Session Check In - 12/25/17 1545      Check-In   Location  [facility]    Staff Present  Gracielynn Birkel Angelina Pih, MS, EP, [facility], Exercise Physiologist;Debra Wynetta Emery, RN, BSN    Medication changes reported      Yes    Comments  Dr. Domenic Polite increased Lisinpril to 7m daily at bedtime.     Fall or balance concerns reported     No    Tobacco Cessation  No Change    Warm-up and Cool-down  Performed as group-led instruction    Resistance Training Performed  Yes    VAD Patient?  No    PAD/SET Patient?  No      VAD patient   Has back up controller?  No    Has spare charged batteries?  No    Has battery cables?  No    Has compatible battery clips?  No    Comments  Does not have LVAD      Pain Assessment   Currently in Pain?  No/denies    Pain Score  0-No pain    Multiple Pain Sites  No       Capillary Blood Glucose: No results found for this or any previous visit (from the past 24 hour(s)).    Social History   Tobacco Use  Smoking Status Never Smoker  Smokeless Tobacco Never Used  Tobacco Comment   tobacco use - no    Goals Met:  Independence with exercise equipment Exercise tolerated well Personal goals reviewed No report of cardiac concerns or symptoms Strength training completed today  Goals Unmet:  Not Applicable  Comments: Pt able to follow exercise prescription today without complaint.  Will continue to monitor for progression. Check out: 4:45   Dr. SKate Sableis Medical Director [place].

## 2017-12-27 ENCOUNTER — Encounter (HOSPITAL_COMMUNITY)
Admission: RE | Admit: 2017-12-27 | Discharge: 2017-12-27 | Disposition: A | Discharge status: Home / Self Care | Payer: Medicare Other | Source: Ambulatory Visit | Attending: Cardiology | Admitting: Cardiology

## 2017-12-27 DIAGNOSIS — Z951 Presence of aortocoronary bypass graft: Secondary | ICD-10-CM | POA: Diagnosis not present

## 2017-12-27 NOTE — Progress Notes (Signed)
Daily Session Note  Patient Details  Name: Cass Vandermeulen. MRN: [id number] Date of Birth: [birth year] Referring Provider:     CARDIAC REHAB PHASE II ORIENTATION from 10/22/2017 in [place]  Referring Provider  Dr. Domenic Polite      Encounter Date: 12/27/2017  Check In: Session Check In - 12/27/17 1545      Check-In   Supervising physician immediately available to respond to emergencies  See telemetry face sheet for immediately available MD    Location  [facility]    Staff Present  Russella Dar, MS, EP, [facility], Exercise Physiologist;Debra Wynetta Emery, RN, BSN    Medication changes reported      Yes    Fall or balance concerns reported     No    Tobacco Cessation  No Change    Warm-up and Cool-down  Performed as group-led instruction    Resistance Training Performed  Yes    VAD Patient?  No    PAD/SET Patient?  No      VAD patient   Has back up controller?  No    Has spare charged batteries?  No    Has battery cables?  No    Has compatible battery clips?  No    Comments  Does not have LVAD      Pain Assessment   Currently in Pain?  No/denies    Pain Score  0-No pain    Multiple Pain Sites  No       Capillary Blood Glucose: No results found for this or any previous visit (from the past 24 hour(s)).    Social History   Tobacco Use  Smoking Status Never Smoker  Smokeless Tobacco Never Used  Tobacco Comment   tobacco use - no    Goals Met:  Independence with exercise equipment Exercise tolerated well Personal goals reviewed No report of cardiac concerns or symptoms Strength training completed today  Goals Unmet:  Not Applicable  Comments: Pt able to follow exercise prescription today without complaint.  Will continue to monitor for progression. Check out: 1645   Dr. Kate Sable is Medical Director for [place] and Pulmonary Rehab.

## 2017-12-30 ENCOUNTER — Encounter (HOSPITAL_COMMUNITY): Payer: Medicare Other

## 2018-01-01 ENCOUNTER — Encounter (HOSPITAL_COMMUNITY)
Admission: RE | Admit: 2018-01-01 | Discharge: 2018-01-01 | Disposition: A | Discharge status: Home / Self Care | Payer: Medicare Other | Source: Ambulatory Visit | Attending: Cardiology | Admitting: Cardiology

## 2018-01-01 DIAGNOSIS — Z951 Presence of aortocoronary bypass graft: Secondary | ICD-10-CM | POA: Diagnosis not present

## 2018-01-01 NOTE — Progress Notes (Signed)
Daily Session Note  Patient Details  Name: Eric Beltran. MRN: [id number] Date of Birth: May 03, 1941 Referring Provider:     CARDIAC REHAB PHASE II ORIENTATION from 10/22/2017 in [place]  Referring Provider  Dr. Domenic Polite      Encounter Date: 01/01/2018  Check In: Session Check In - 01/01/18 1545      Check-In   Supervising physician immediately available to respond to emergencies  See telemetry face sheet for immediately available MD    Location  [facility]    Staff Present  Russella Dar, MS, EP, [facility], Exercise Physiologist;Thaine Garriga Wynetta Emery, RN, BSN    Medication changes reported      No    Fall or balance concerns reported     No    Warm-up and Cool-down  Performed as group-led instruction    Resistance Training Performed  Yes    VAD Patient?  No    PAD/SET Patient?  No      Pain Assessment   Currently in Pain?  No/denies    Pain Score  0-No pain    Multiple Pain Sites  No       Capillary Blood Glucose: No results found for this or any previous visit (from the past 24 hour(s)).    Social History   Tobacco Use  Smoking Status Never Smoker  Smokeless Tobacco Never Used  Tobacco Comment   tobacco use - no    Goals Met:  Independence with exercise equipment Exercise tolerated well No report of cardiac concerns or symptoms Strength training completed today  Goals Unmet:  Not Applicable  Comments: Pt able to follow exercise prescription today without complaint.  Will continue to monitor for progression. Check out 1645.   Dr. Kate Sable is Medical Director for [facility] Cardiac and Pulmonary Rehab.

## 2018-01-03 ENCOUNTER — Encounter (HOSPITAL_COMMUNITY)
Admission: RE | Admit: 2018-01-03 | Discharge: 2018-01-03 | Disposition: A | Discharge status: Home / Self Care | Payer: Medicare Other | Source: Ambulatory Visit | Attending: Cardiology | Admitting: Cardiology

## 2018-01-03 DIAGNOSIS — Z951 Presence of aortocoronary bypass graft: Secondary | ICD-10-CM | POA: Diagnosis not present

## 2018-01-03 NOTE — Progress Notes (Signed)
Daily Session Note  Patient Details  Name: Eric Beltran. MRN: [id number] Date of Birth: 04-25-1941 Referring Provider:     CARDIAC REHAB PHASE II ORIENTATION from 10/22/2017 in [place]  Referring Provider  Dr. Domenic Polite      Encounter Date: 01/03/2018  Check In: Session Check In - 01/03/18 1545      Check-In   Supervising physician immediately available to respond to emergencies  See telemetry face sheet for immediately available MD    Location  [facility]    Staff Present  Russella Dar, MS, EP, [facility], Exercise Physiologist;Debra Wynetta Emery, RN, BSN    Medication changes reported      No    Fall or balance concerns reported     No    Tobacco Cessation  No Change    Warm-up and Cool-down  Performed as group-led instruction    Resistance Training Performed  Yes    VAD Patient?  No    PAD/SET Patient?  No      VAD patient   Has back up controller?  No    Has spare charged batteries?  No    Has battery cables?  No    Has compatible battery clips?  No    Comments  Does not have LVAD      Pain Assessment   Currently in Pain?  No/denies    Pain Score  0-No pain    Multiple Pain Sites  No       Capillary Blood Glucose: No results found for this or any previous visit (from the past 24 hour(s)).    Social History   Tobacco Use  Smoking Status Never Smoker  Smokeless Tobacco Never Used  Tobacco Comment   tobacco use - no    Goals Met:  Independence with exercise equipment Exercise tolerated well Personal goals reviewed No report of cardiac concerns or symptoms Strength training completed today  Goals Unmet:  Not Applicable  Comments: Pt able to follow exercise prescription today without complaint.  Will continue to monitor for progression. Check out: 1645   Dr. Kate Sable is Medical Director for [place] and Pulmonary Rehab.

## 2018-01-06 ENCOUNTER — Encounter (HOSPITAL_COMMUNITY)
Admission: RE | Admit: 2018-01-06 | Discharge: 2018-01-06 | Disposition: A | Discharge status: Home / Self Care | Payer: Medicare Other | Source: Ambulatory Visit | Attending: Cardiology | Admitting: Cardiology

## 2018-01-06 DIAGNOSIS — Z951 Presence of aortocoronary bypass graft: Secondary | ICD-10-CM

## 2018-01-06 NOTE — Progress Notes (Addendum)
Daily Session Note  Patient Details  Name: Eric L Letendre Jr. MRN: [id number] Date of Birth: 10/27/1940 Referring Provider:     CARDIAC REHAB PHASE II ORIENTATION from 10/22/2017 in [place] [facility]  Referring Provider  Dr. McDowell      Encounter Date: 01/06/2018  Check In: Session Check In - 01/06/18 1545      Check-In   Supervising physician immediately available to respond to emergencies  See telemetry face sheet for immediately available MD    Location  [facility]    Staff Present   , MS, EP, CHC, Exercise Physiologist;Debra Johnson, RN, BSN    Medication changes reported      No    Tobacco Cessation  No Change    Warm-up and Cool-down  Performed as group-led instruction    Resistance Training Performed  Yes    VAD Patient?  No    PAD/SET Patient?  No      VAD patient   Has back up controller?  No    Has spare charged batteries?  No    Has battery cables?  No    Has compatible battery clips?  No    Comments  Does not have LVAD      Pain Assessment   Currently in Pain?  No/denies    Pain Score  0-No pain    Multiple Pain Sites  No       Capillary Blood Glucose: No results found for this or any previous visit (from the past 24 hour(s)).    Social History   Tobacco Use  Smoking Status Never Smoker  Smokeless Tobacco Never Used  Tobacco Comment   tobacco use - no    Goals Met:  Independence with exercise equipment Exercise tolerated well Personal goals reviewed No report of cardiac concerns or symptoms Strength training completed today  Goals Unmet:  Not Applicable  Comments: Pt able to follow exercise prescription today without complaint.  Will continue to monitor for progression. Patient c/o chest (Angina) pain after 6 minute walk test and after working out on Nustep. He also shared he has same pain sometime after his daily walk at home.   Check out: 1645   Dr. Suresh Koneswaran is Medical Director for  [facility]. 

## 2018-01-08 ENCOUNTER — Encounter (HOSPITAL_COMMUNITY): Payer: Medicare Other

## 2018-01-08 NOTE — Progress Notes (Signed)
Cardiac Individual Treatment Plan  Patient Details  Name: Eric Beltran. MRN: [id number] Date of Birth: [birth year] Referring Provider:     CARDIAC REHAB PHASE II ORIENTATION from 10/22/2017 in [place]  Referring Provider  Dr. Domenic Polite      Initial Encounter Date:    CARDIAC REHAB PHASE II ORIENTATION from 10/22/2017 in [place]  Date  10/22/17      Visit Diagnosis: S/P CABG x 4  Patient's Home Medications on Admission:  Current Outpatient Medications:  .  amiodarone (PACERONE) 200 MG tablet, TAKE 1/2 TABLET BY MOUTH DAILY (CUT IN HALF FOR PATIENT), Disp: 45 tablet, Rfl: 6 .  aspirin EC 81 MG tablet, Take 81 mg by mouth daily., Disp: , Rfl:  .  atorvastatin (LIPITOR) 40 MG tablet, Take 1 tablet (40 mg total) by mouth at bedtime., Disp: 90 tablet, Rfl: 3 .  furosemide (LASIX) 20 MG tablet, Take 2 tablets (40 mg total) by mouth daily. (Patient taking differently: Take 40 mg by mouth daily as needed for fluid. ), Disp: 180 tablet, Rfl: 3 .  lisinopril (PRINIVIL,ZESTRIL) 5 MG tablet, Take 1 tablet (5 mg total) by mouth daily., Disp: 90 tablet, Rfl: 3 .  metoprolol tartrate (LOPRESSOR) 25 MG tablet, TAKE 1 TABLET BY MOUTH TWICE DAILY, Disp: 60 tablet, Rfl: 6 .  Multiple Vitamin (MULTIVITAMIN) tablet, Take 1 tablet by mouth at bedtime. , Disp: , Rfl:  .  Omega-3 Fatty Acids (FISH OIL) 1000 MG CAPS, Take 1,000 capsules by mouth daily., Disp: , Rfl:  .  Tamsulosin HCl (FLOMAX) 0.4 MG CAPS, Take 0.4 mg by mouth daily. , Disp: , Rfl:   Past Medical History: Past Medical History:  Diagnosis Date  . Arthritis   . Ascending aortic aneurysm [facility])    Status post repair 12/2011 at [facility]  . BPH (benign prostatic hyperplasia)   . Chronic diastolic CHF (congestive heart failure) ([place])   . Coronary atherosclerosis of native coronary artery    a. Multivessel status post prior stenting and ultimately CABG 12/2011 at [facility] with with LIMA to LAD,  SVG to PLB, SVG to ramus, SVG to OM . b. Last cath 06/2014 (following ischemic nuc) -> medical therapy, no feasible way to revascularize LCx territory.  . Essential hypertension   . Mixed hyperlipidemia   . Myocardial infarction ([place])   . Postoperative atrial fibrillation ([place])   . Supraventricular tachycardia ([place])     Tobacco Use: Social History   Tobacco Use  Smoking Status Never Smoker  Smokeless Tobacco Never Used  Tobacco Comment   tobacco use - no    Labs: Recent Review Flowsheet Data    Labs for ITP Cardiac and Pulmonary Rehab Latest Ref Rng & Units 08/06/2017 08/07/2017 08/07/2017 08/07/2017 08/08/2017   Cholestrol 0 - 200 mg/dL - - - - -   LDLCALC 0 - 99 mg/dL - - - - -   HDL >40 mg/dL - - - - -   Trlycerides <150 mg/dL - - - - -   Hemoglobin A1c 4.8 - 5.6 % - - - - -   PHART 7.350 - 7.450 - 7.388 7.349(L) - -   PCO2ART 32.0 - 48.0 mmHg - 38.5 42.6 - -   HCO3 20.0 - 28.0 mmol/L - 23.1 23.3 - -   TCO2 22 - 32 mmol/L '22 24 25 22 26   '$ ACIDBASEDEF 0.0 - 2.0 mmol/L - 2.0 2.0 - -   O2SAT % - 98.0 93.0 - -  Capillary Blood Glucose: Lab Results  Component Value Date   GLUCAP 114 (H) 08/08/2017   GLUCAP 139 (H) 08/08/2017   GLUCAP 152 (H) 08/07/2017   GLUCAP 151 (H) 08/07/2017   GLUCAP 120 (H) 08/07/2017     Exercise Target Goals: Exercise Program Goal: Individual exercise prescription set using results from initial 6 min walk test and THRR while considering  patient's activity barriers and safety.   Exercise Prescription Goal: Starting with aerobic activity 30 plus minutes a day, 3 days per week for initial exercise prescription. Provide home exercise prescription and guidelines that participant acknowledges understanding prior to discharge.  Activity Barriers & Risk Stratification: Activity Barriers & Cardiac Risk Stratification - 10/22/17 1432      Activity Barriers & Cardiac Risk Stratification   Activity Barriers  None    Cardiac Risk Stratification  High        6 Minute Walk: 6 Minute Walk    Row Name 10/22/17 1431         6 Minute Walk   Phase  Initial     Distance  1400 feet     Distance % Change  0 %     Distance Feet Change  0 ft     Walk Time  6 minutes     # of Rest Breaks  0     MPH  2.65     METS  3.03     RPE  11     Perceived Dyspnea   11     VO2 Peak  9.26     Symptoms  No     Resting HR  63 bpm     Resting BP  122/60     Resting Oxygen Saturation   94 %     Exercise Oxygen Saturation  during 6 min walk  91 %     Max Ex. HR  86 bpm     Max Ex. BP  150/66     2 Minute Post BP  124/62        Oxygen Initial Assessment:   Oxygen Re-Evaluation:   Oxygen Discharge (Final Oxygen Re-Evaluation):   Initial Exercise Prescription: Initial Exercise Prescription - 10/22/17 1400      Date of Initial Exercise RX and Referring Provider   Date  10/22/17    Referring Provider  Dr. Domenic Polite      NuStep   Level  1    SPM  73    Minutes  15    METs  2.3      Recumbant Elliptical   Level  1    RPM  51    Watts  64    Minutes  20    METs  3.6      Prescription Details   Frequency (times per week)  3    Duration  Progress to 30 minutes of continuous aerobic without signs/symptoms of physical distress      Intensity   THRR 40-80% of Max Heartrate  96-112-129    Ratings of Perceived Exertion  11-13    Perceived Dyspnea  0-4      Progression   Progression  Continue progressive overload as per policy without signs/symptoms or physical distress.      Resistance Training   Training Prescription  Yes    Weight  1    Reps  10-15       Perform Capillary Blood Glucose checks as needed.  Exercise Prescription Changes:  Exercise Prescription Changes  [place] Name 10/28/17 1400 11/11/17 1500 11/30/17 1200 12/12/17 1700 01/03/18 0700     Response to Exercise   Blood Pressure (Admit)  160/70  132/60  138/68  162/70  150/72   Blood Pressure (Exercise)  206/84  180/78  160/72  200/70  170/80   Blood Pressure  (Exit)  160/72  122/60  130/74  138/76  160/80   Heart Rate (Admit)  69 bpm  66 bpm  68 bpm  64 bpm  60 bpm   Heart Rate (Exercise)  97 bpm  90 bpm  93 bpm  91 bpm  87 bpm   Heart Rate (Exit)  78 bpm  54 bpm  77 bpm  73 bpm  70 bpm   Rating of Perceived Exertion (Exercise)  12  13  13  12  12    Duration  Progress to 30 minutes of  aerobic without signs/symptoms of physical distress  Progress to 30 minutes of  aerobic without signs/symptoms of physical distress  Progress to 30 minutes of  aerobic without signs/symptoms of physical distress  Progress to 30 minutes of  aerobic without signs/symptoms of physical distress  Progress to 30 minutes of  aerobic without signs/symptoms of physical distress   Intensity  THRR New 99-114-130  THRR New 98-113-129  THRR New 102-119-136  THRR New 96-112-128  THRR New 94-11-127     Progression   Progression  Continue to progress workloads to maintain intensity without signs/symptoms of physical distress.  Continue to progress workloads to maintain intensity without signs/symptoms of physical distress.  Continue to progress workloads to maintain intensity without signs/symptoms of physical distress.  Continue to progress workloads to maintain intensity without signs/symptoms of physical distress.  Continue to progress workloads to maintain intensity without signs/symptoms of physical distress.   Average METs  -  -  5.65  5.15  4.8     Resistance Training   Training Prescription  Yes  Yes  Yes  Yes  Yes   Weight  1  3  4  5  5    Reps  10-15  10-15  10-15  10-15  10-15   Time  -  -  5 Minutes  5 Minutes  5 Minutes     NuStep   Level  1  1  2  2  3    SPM  111  127  129  129  112   Minutes  15  15  15  15  17    METs  3.4  4.9  4.8  5  3.9     Recumbant Elliptical   Level  1  1  1  2  3    RPM  70  73  78  72  72   Watts  101  108  122  98  106   Minutes  20  20  20  20  22    METs  5.5  5.8  6.5  5.3  5.7     Home Exercise Plan   Plans to continue exercise at   -  -  Home (comment)  Home (comment)  Home (comment)   Frequency  -  -  Add 2 additional days to program exercise sessions.  Add 2 additional days to program exercise sessions.  Add 2 additional days to program exercise sessions.   Initial Home Exercises Provided  -  -  11/01/17  11/01/17  11/01/17      Exercise Comments:  Exercise Comments  [place] Name 10/28/17 1452 11/11/17 1510 12/12/17 1739 01/08/18 0740     Exercise Comments  Patient has just started CR and will be progressed in time.   Patient has been doing very well in CR. patient has increase his dumbbell size to 3lbs and has also increased the RPMs and SPMs on his machines by a high amount. Patient will continued to be monitored in CR.   Patient has done well so far in the program. Enjoys coming and feels the fellowship is as important as the exercise.   Patient has done well so far in the program. Enjoys coming and feels the fellowship is as important as the exercise.        Exercise Goals and Review:  Exercise Goals    Row Name 10/22/17 1433             Exercise Goals   Increase Physical Activity  Yes       Intervention  Provide advice, education, support and counseling about physical activity/exercise needs.;Develop an individualized exercise prescription for aerobic and resistive training based on initial evaluation findings, risk stratification, comorbidities and participant's personal goals.       Expected Outcomes  Short Term: Attend rehab on a regular basis to increase amount of physical activity.       Increase Strength and Stamina  Yes       Intervention  Provide advice, education, support and counseling about physical activity/exercise needs.;Develop an individualized exercise prescription for aerobic and resistive training based on initial evaluation findings, risk stratification, comorbidities and participant's personal goals.       Expected Outcomes  Short Term: Increase workloads from initial exercise  prescription for resistance, speed, and METs.       Able to understand and use rate of perceived exertion (RPE) scale  Yes       Intervention  Provide education and explanation on how to use RPE scale       Expected Outcomes  Short Term: Able to use RPE daily in rehab to express subjective intensity level;Long Term:  Able to use RPE to guide intensity level when exercising independently       Able to understand and use Dyspnea scale  Yes       Intervention  Provide education and explanation on how to use Dyspnea scale       Expected Outcomes  Short Term: Able to use Dyspnea scale daily in rehab to express subjective sense of shortness of breath during exertion;Long Term: Able to use Dyspnea scale to guide intensity level when exercising independently       Knowledge and understanding of Target Heart Rate Range (THRR)  Yes       Intervention  Provide education and explanation of THRR including how the numbers were predicted and where they are located for reference       Expected Outcomes  Short Term: Able to state/look up THRR;Long Term: Able to use THRR to govern intensity when exercising independently;Short Term: Able to use daily as guideline for intensity in rehab       Able to check pulse independently  Yes       Intervention  Provide education and demonstration on how to check pulse in carotid and radial arteries.;Review the importance of being able to check your own pulse for safety during independent exercise       Expected Outcomes  Short Term: Able to explain why pulse checking is important during independent exercise;Long Term: Able to check pulse  independently and accurately       Understanding of Exercise Prescription  Yes       Intervention  Provide education, explanation, and written materials on patient's individual exercise prescription       Expected Outcomes  Short Term: Able to explain program exercise prescription;Long Term: Able to explain home exercise prescription to exercise  independently          Exercise Goals Re-Evaluation : Exercise Goals Re-Evaluation    Row Name 11/15/17 1458 12/12/17 1736 01/08/18 0732         Exercise Goal Re-Evaluation   Exercise Goals Review  Increase Physical Activity;Increase Strength and Stamina;Able to understand and use rate of perceived exertion (RPE) scale;Understanding of Exercise Prescription;Able to understand and use Dyspnea scale;Knowledge and understanding of Target Heart Rate Range (THRR);Improve claudication pain tolerance and improve walking ability;Able to check pulse independently  Increase Strength and Stamina  Increase Physical Activity;Increase Strength and Stamina     Comments  Patient has been doing well in CR. patient has stated to me that he has an increase of stamina and has gained some stamina from beginning CR. This has helped hom in tasks when he is not here in CR.   Patient has progressed at a steady rate. He has been able to handle the increases in weights and workloads on the equipment. Patient feels like hehas increase his stamina, that he is getting in shape and that his arms are becoming toned.   Patient has progressed nicely. He has recently been concerned about his reoccuring chest pain after exercise in the program and while exercisng at home. I have relayed this message to his cardiologist and his doctor has worked on his medications. Will continue to monitor this and report any future occurances to his doctor.      Expected Outcomes  Patient wishes to gain stamina back and get back into shape.   To continue to gain stamina and get in shape  Build stamina, Get in shape and stay in shape.         Discharge Exercise Prescription (Final Exercise Prescription Changes): Exercise Prescription Changes - 01/03/18 0700      Response to Exercise   Blood Pressure (Admit)  150/72    Blood Pressure (Exercise)  170/80    Blood Pressure (Exit)  160/80    Heart Rate (Admit)  60 bpm    Heart Rate (Exercise)  87  bpm    Heart Rate (Exit)  70 bpm    Rating of Perceived Exertion (Exercise)  12    Duration  Progress to 30 minutes of  aerobic without signs/symptoms of physical distress    Intensity  THRR New   94-11-127     Progression   Progression  Continue to progress workloads to maintain intensity without signs/symptoms of physical distress.    Average METs  4.8      Resistance Training   Training Prescription  Yes    Weight  5    Reps  10-15    Time  5 Minutes      NuStep   Level  3    SPM  112    Minutes  17    METs  3.9      Recumbant Elliptical   Level  3    RPM  72    Watts  106    Minutes  22    METs  5.7      Home Exercise Plan   Plans  to continue exercise at  Home (comment)    Frequency  Add 2 additional days to program exercise sessions.    Initial Home Exercises Provided  11/01/17       Nutrition:  Target Goals: Understanding of nutrition guidelines, daily intake of sodium 1500mg , cholesterol 200mg , calories 30% from fat and 7% or less from saturated fats, daily to have 5 or more servings of fruits and vegetables.  Biometrics: Pre Biometrics - 10/22/17 1433      Pre Biometrics   Height  5\' 7"  (1.702 m)    Weight  86.8 kg    Waist Circumference  39.5 inches    Hip Circumference  38.5 inches    Waist to Hip Ratio  1.03 %    BMI (Calculated)  29.95    Triceps Skinfold  6 mm    % Body Fat  24.8 %    Grip Strength  73.46 kg    Flexibility  15 in    Single Leg Stand  5 seconds        Nutrition Therapy Plan and Nutrition Goals: Nutrition Therapy & Goals - 01/08/18 1425      Nutrition Therapy   RD appointment deferred  --      Personal Nutrition Goals   Personal Goal #2  --    Additional Goals?  --       Nutrition Assessments: Nutrition Assessments - 10/22/17 1420      MEDFICTS Scores   Pre Score  58       Nutrition Goals Re-Evaluation:   Nutrition Goals Discharge (Final Nutrition Goals Re-Evaluation):   Psychosocial: Target Goals:  Acknowledge presence or absence of significant depression and/or stress, maximize coping skills, provide positive support system. Participant is able to verbalize types and ability to use techniques and skills needed for reducing stress and depression.  Initial Review & Psychosocial Screening: Initial Psych Review & Screening - 10/22/17 1424      Initial Review   Current issues with  None Identified      Family Dynamics   Good Support System?  Yes      Barriers   Psychosocial barriers to participate in program  There are no identifiable barriers or psychosocial needs.      Screening Interventions   Interventions  Encouraged to exercise       Quality of Life Scores: Quality of Life - 10/22/17 1243      Quality of Life Scores   Health/Function Pre  26.3 %    Socioeconomic Pre  23.31 %    Psych/Spiritual Pre  21.71 %    Family Pre  25.2 %    GLOBAL Pre  24.54 %      Scores of 19 and below usually indicate a poorer quality of life in these areas.  A difference of  2-3 points is a clinically meaningful difference.  A difference of 2-3 points in the total score of the Quality of Life Index has been associated with significant improvement in overall quality of life, self-image, physical symptoms, and general health in studies assessing change in quality of life.  PHQ-9: Recent Review Flowsheet Data    Depression screen [facility] 2/9 10/22/2017 02/07/2012   Decreased Interest 0 0   Down, Depressed, Hopeless 0 0   PHQ - 2 Score 0 0   Altered sleeping 1 -   Tired, decreased energy 1 -   Change in appetite 1 -   Feeling bad or failure about yourself  0 -  Trouble concentrating 1 -   Moving slowly or fidgety/restless 0 -   Suicidal thoughts 0 -   PHQ-9 Score 4 -   Difficult doing work/chores Not difficult at all -     Interpretation of Total Score  Total Score Depression Severity:  1-4 = Minimal depression, 5-9 = Mild depression, 10-14 = Moderate depression, 15-19 = Moderately severe  depression, 20-27 = Severe depression   Psychosocial Evaluation and Intervention: Psychosocial Evaluation - 10/22/17 1424      Psychosocial Evaluation & Interventions   Interventions  Encouraged to exercise with the program and follow exercise prescription    Continue Psychosocial Services   No Follow up required       Psychosocial Re-Evaluation: Psychosocial Re-Evaluation    [place] Name 11/20/17 0737 12/18/17 1453 01/03/18 1447         Psychosocial Re-Evaluation   Current issues with  None Identified  None Identified  None Identified     Comments  Patients initial QOL score was 24.54 and his PHQ-9 score was 4 with no psychosocial issues identified.  Patients initial QOL score was 24.54 and his PHQ-9 score was 4 with no psychosocial issues identified.  Patients initial QOL score was 24.54 and his PHQ-9 score was 4 with no psychosocial issues identified.     Expected Outcomes  Patient will have no psychosocial issues identified at discharge.   Patient will have no psychosocial issues identified at discharge.   Patient will have no psychosocial issues identified at discharge.      Interventions  Relaxation education;Encouraged to attend Cardiac Rehabilitation for the exercise;Stress management education  Relaxation education;Encouraged to attend Cardiac Rehabilitation for the exercise;Stress management education  Relaxation education;Encouraged to attend Cardiac Rehabilitation for the exercise;Stress management education     Continue Psychosocial Services   No Follow up required  No Follow up required  No Follow up required        Psychosocial Discharge (Final Psychosocial Re-Evaluation): Psychosocial Re-Evaluation - 01/03/18 1447      Psychosocial Re-Evaluation   Current issues with  None Identified    Comments  Patients initial QOL score was 24.54 and his PHQ-9 score was 4 with no psychosocial issues identified.    Expected Outcomes  Patient will have no psychosocial issues identified at  discharge.     Interventions  Relaxation education;Encouraged to attend Cardiac Rehabilitation for the exercise;Stress management education    Continue Psychosocial Services   No Follow up required       Vocational Rehabilitation: Provide vocational rehab assistance to qualifying candidates.   Vocational Rehab Evaluation & Intervention: Vocational Rehab - 10/22/17 1416      Initial Vocational Rehab Evaluation & Intervention   Assessment shows need for Vocational Rehabilitation  No       Education: Education Goals: Education classes will be provided on a weekly basis, covering required topics. Participant will state understanding/return demonstration of topics presented.  Learning Barriers/Preferences: Learning Barriers/Preferences - 10/22/17 1416      Learning Barriers/Preferences   Learning Barriers  None    Learning Preferences  Audio;Computer/Internet;Group Instruction;Individual Instruction;Pictoral;Skilled Demonstration;Verbal Instruction;Video;Written Material       Education Topics: Hypertension, Hypertension Reduction -Define heart disease and high blood pressure. Discus how high blood pressure affects the body and ways to reduce high blood pressure.   CARDIAC REHAB PHASE II EXERCISE from 01/01/2018 in [place]  Date  12/18/17  Educator  D. Coad  Instruction Review Code  2- Demonstrated Understanding  Exercise and Your Heart -Discuss why it is important to exercise, the FITT principles of exercise, normal and abnormal responses to exercise, and how to exercise safely.   CARDIAC REHAB PHASE II EXERCISE from 01/01/2018 in [place]  Date  12/25/17  Educator  D. Coad  Instruction Review Code  2- Demonstrated Understanding      Angina -Discuss definition of angina, causes of angina, treatment of angina, and how to decrease risk of having angina.   CARDIAC REHAB PHASE II EXERCISE from 01/01/2018 in [place]  Date  01/01/18  Educator  D. Coad  Instruction Review Code  2- Demonstrated Understanding      Cardiac Medications -Review what the following cardiac medications are used for, how they affect the body, and side effects that may occur when taking the medications.  Medications include Aspirin, Beta blockers, calcium channel blockers, ACE Inhibitors, angiotensin receptor blockers, diuretics, digoxin, and antihyperlipidemics.   Congestive Heart Failure -Discuss the definition of CHF, how to live with CHF, the signs and symptoms of CHF, and how keep track of weight and sodium intake.   Heart Disease and Intimacy -Discus the effect sexual activity has on the heart, how changes occur during intimacy as we age, and safety during sexual activity.   CARDIAC REHAB PHASE II EXERCISE from 01/01/2018 in [place]  Date  10/23/17  Educator  DC  Instruction Review Code  2- Demonstrated Understanding      Smoking Cessation / COPD -Discuss different methods to quit smoking, the health benefits of quitting smoking, and the definition of COPD.   CARDIAC REHAB PHASE II EXERCISE from 01/01/2018 in [place]  Date  10/30/17  Educator  [place]  Instruction Review Code  2- Demonstrated Understanding      Nutrition I: Fats -Discuss the types of cholesterol, what cholesterol does to the heart, and how cholesterol levels can be controlled.   CARDIAC REHAB PHASE II EXERCISE from 01/01/2018 in [place]  Date  11/06/17  Educator  DC  Instruction Review Code  2- Demonstrated Understanding      Nutrition II: Labels -Discuss the different components of food labels and how to read food label   CARDIAC REHAB PHASE II EXERCISE from 01/01/2018 in [place]  Date  11/13/17  Educator  Etheleen Mayhew  Instruction Review Code  2- Demonstrated Understanding      Heart Parts/Heart Disease and  PAD -Discuss the anatomy of the heart, the pathway of blood circulation through the heart, and these are affected by heart disease.   CARDIAC REHAB PHASE II EXERCISE from 01/01/2018 in [place]  Date  11/20/17  Educator  DC  Instruction Review Code  2- Demonstrated Understanding      Stress I: Signs and Symptoms -Discuss the causes of stress, how stress may lead to anxiety and depression, and ways to limit stress.   CARDIAC REHAB PHASE II EXERCISE from 01/01/2018 in [place]  Date  11/27/17  Educator  D. Coad  Instruction Review Code  2- Demonstrated Understanding      Stress II: Relaxation -Discuss different types of relaxation techniques to limit stress.   CARDIAC REHAB PHASE II EXERCISE from 01/01/2018 in [place]  Date  12/04/17  Educator  D. Coad  Instruction Review Code  2- Demonstrated Understanding      Warning Signs of Stroke / TIA -Discuss definition of a stroke,  what the signs and symptoms are of a stroke, and how to identify when someone is having stroke.   CARDIAC REHAB PHASE II EXERCISE from 01/01/2018 in [place]  Date  12/11/17  Educator  DC  Instruction Review Code  2- Demonstrated Understanding      Knowledge Questionnaire Score: Knowledge Questionnaire Score - 10/22/17 1416      Knowledge Questionnaire Score   Pre Score  24/24       Core Components/Risk Factors/Patient Goals at Admission: Personal Goals and Risk Factors at Admission - 10/22/17 1420      Core Components/Risk Factors/Patient Goals on Admission    Weight Management  Yes    Intervention  Weight Management/Obesity: Establish reasonable short term and long term weight goals.    Admit Weight  191 lb 4.8 oz (86.8 kg)    Goal Weight: Short Term  186 lb 4.8 oz (84.5 kg)    Goal Weight: Long Term  181 lb 4.8 oz (82.2 kg)    Expected Outcomes  Short Term: Continue to assess and modify  interventions until short term weight is achieved;Long Term: Adherence to nutrition and physical activity/exercise program aimed toward attainment of established weight goal    Personal Goal Other  Yes    Personal Goal  Build Stamina, Get in shap and stay in shape.     Intervention  Attend CR 3 x week and supplement with home exercise 2xweek.     Expected Outcomes  Reach personal goals.        Core Components/Risk Factors/Patient Goals Review:  Goals and Risk Factor Review    Row Name 11/20/17 0735 12/18/17 1451 01/03/18 1442         Core Components/Risk Factors/Patient Goals Review   Personal Goals Review  Weight Management/Obesity Build stamina; get in shape; stay in shape.  Weight Management/Obesity Build stamina; get in shape; stay in shape.   Weight Management/Obesity Build stamina; get in shape; stay in shape.     Review  Patient has completed 13 sessions gaining 6 lbs since he started the program. He says he stills needs to work on his eating with portion sizes and making healthier choices. He says he does feel stronger since he started and like being in an exercise program. He hopes to get stronger and get in shape as he continues. Will continue to monitor for progress.   Patient has completed 24 sessions gaining 2 lbs since last 30 day review. He continues to do well in the program with progression. He says he feels like he is getting in shape and feels like his upper body is getting toned. He feels he has more stamina and energy and feels stronger and better overall. Will continue to monitor for progress.   Patient has completed 30 sessions maintaining his weight since last 30 day review. He continues to do well in the program with progression. He continues to say he feels stronger and feels like he is getting in shape. Patient saw his cardiologist on 12/25/17 due experiencing angina symptoms and elevated blood pressure during his sessions. MD increased Lisonopril from 2.5 mg to 5 mg daily.  He denies angina now but his blood pressure remains elevated. Will continue to monitor.      Expected Outcomes  Patient will continue to attend sessions and complete the program.   Patient will continue to attend sessions and complete the program.   Patient will continue to attend sessions and complete the program.  Core Components/Risk Factors/Patient Goals at Discharge (Final Review):  Goals and Risk Factor Review - 01/03/18 1442      Core Components/Risk Factors/Patient Goals Review   Personal Goals Review  Weight Management/Obesity   Build stamina; get in shape; stay in shape.   Review  Patient has completed 30 sessions maintaining his weight since last 30 day review. He continues to do well in the program with progression. He continues to say he feels stronger and feels like he is getting in shape. Patient saw his cardiologist on 12/25/17 due experiencing angina symptoms and elevated blood pressure during his sessions. MD increased Lisonopril from 2.5 mg to 5 mg daily. He denies angina now but his blood pressure remains elevated. Will continue to monitor.     Expected Outcomes  Patient will continue to attend sessions and complete the program.        ITP Comments: ITP Comments    Row Name 10/22/17 1417 10/24/17 1005         ITP Comments  Mr. Joyce has been in our program 2 times before. He completed both times. He is very eager to get started again.   Patient is new to program. He has completed 2 sessions. Will continue to monitor for progress.          Comments: ITP REVIEW Patient is doing well in the program. Will continue to monitor for progress.

## 2018-01-10 ENCOUNTER — Encounter (HOSPITAL_COMMUNITY)
Admission: RE | Admit: 2018-01-10 | Discharge: 2018-01-10 | Disposition: A | Discharge status: Home / Self Care | Payer: Medicare Other | Source: Ambulatory Visit | Attending: Cardiology | Admitting: Cardiology

## 2018-01-10 DIAGNOSIS — Z951 Presence of aortocoronary bypass graft: Secondary | ICD-10-CM | POA: Diagnosis not present

## 2018-01-10 NOTE — Progress Notes (Addendum)
Daily Session Note  Patient Details  Name: Eric Beltran. MRN: [id number] Date of Birth: Jul 05, 1940 Referring Provider:     CARDIAC REHAB PHASE II ORIENTATION from 10/22/2017 in [place]  Referring Provider  Dr. Domenic Polite      Encounter Date: 01/10/2018  Check In: Session Check In - 01/10/18 1545      Check-In   Supervising physician immediately available to respond to emergencies  See telemetry face sheet for immediately available MD    Location  [facility]    Staff Present  Russella Dar, MS, EP, [facility], Exercise Physiologist;[place] Wynetta Emery, RN, BSN    Medication changes reported      No    Fall or balance concerns reported     No    Warm-up and Cool-down  Performed as group-led instruction    Resistance Training Performed  Yes    VAD Patient?  No    PAD/SET Patient?  No      Pain Assessment   Currently in Pain?  No/denies    Pain Score  0-No pain    Multiple Pain Sites  No       Capillary Blood Glucose: No results found for this or any previous visit (from the past 24 hour(s)).    Social History   Tobacco Use  Smoking Status Never Smoker  Smokeless Tobacco Never Used  Tobacco Comment   tobacco use - no    Goals Met:  Independent with exercise equipment.   Goals Unmet:  Not Applicable  Comments: Patient complained of substernal chest pain after 7 minutes on the elliptical 5/10. He took one SL Nitroglycerin and pain was relieved 0/10 after 3 minutes. He did not experience any further pain. Will continue to monitor. Dr. Domenic Polite notified. Pt able to follow exercise prescription today without complaint.  Will continue to monitor for progression. Check out 1645.   Dr. Kate Sable is Medical Director for [facility] Cardiac and Pulmonary Rehab.

## 2018-01-13 ENCOUNTER — Encounter (HOSPITAL_COMMUNITY): Payer: Medicare Other

## 2018-01-15 ENCOUNTER — Encounter (HOSPITAL_COMMUNITY)
Admission: RE | Admit: 2018-01-15 | Discharge: 2018-01-15 | Disposition: A | Discharge status: Home / Self Care | Payer: Medicare Other | Source: Ambulatory Visit | Attending: Cardiology | Admitting: Cardiology

## 2018-01-15 DIAGNOSIS — Z79899 Other long term (current) drug therapy: Secondary | ICD-10-CM | POA: Insufficient documentation

## 2018-01-15 DIAGNOSIS — Z8679 Personal history of other diseases of the circulatory system: Secondary | ICD-10-CM | POA: Diagnosis not present

## 2018-01-15 DIAGNOSIS — E782 Mixed hyperlipidemia: Secondary | ICD-10-CM | POA: Insufficient documentation

## 2018-01-15 DIAGNOSIS — I2510 Atherosclerotic heart disease of native coronary artery without angina pectoris: Secondary | ICD-10-CM | POA: Diagnosis not present

## 2018-01-15 DIAGNOSIS — M1990 Unspecified osteoarthritis, unspecified site: Secondary | ICD-10-CM | POA: Diagnosis not present

## 2018-01-15 DIAGNOSIS — N400 Enlarged prostate without lower urinary tract symptoms: Secondary | ICD-10-CM | POA: Diagnosis not present

## 2018-01-15 DIAGNOSIS — Z7982 Long term (current) use of aspirin: Secondary | ICD-10-CM | POA: Insufficient documentation

## 2018-01-15 DIAGNOSIS — I110 Hypertensive heart disease with heart failure: Secondary | ICD-10-CM | POA: Diagnosis not present

## 2018-01-15 DIAGNOSIS — Z951 Presence of aortocoronary bypass graft: Secondary | ICD-10-CM | POA: Insufficient documentation

## 2018-01-15 DIAGNOSIS — I5032 Chronic diastolic (congestive) heart failure: Secondary | ICD-10-CM | POA: Diagnosis not present

## 2018-01-15 DIAGNOSIS — I252 Old myocardial infarction: Secondary | ICD-10-CM | POA: Insufficient documentation

## 2018-01-15 NOTE — Progress Notes (Signed)
Daily Session Note  Patient Details  Name: Eric Beltran. MRN: [id number] Date of Birth: 03-Jun-1940 Referring Provider:     CARDIAC REHAB PHASE II ORIENTATION from 10/22/2017 in [place]  Referring Provider  Dr. Domenic Polite      Encounter Date: 01/15/2018  Check In: Session Check In - 01/15/18 1554      Check-In   Supervising physician immediately available to respond to emergencies  See telemetry face sheet for immediately available MD    Location  [facility]    Staff Present  Aundra Dubin, RN, BSN;Other    Medication changes reported      No    Fall or balance concerns reported     No    Tobacco Cessation  No Change    Warm-up and Cool-down  Performed as group-led instruction    Resistance Training Performed  Yes    VAD Patient?  No    PAD/SET Patient?  No      VAD patient   Has back up controller?  No    Has spare charged batteries?  No    Has battery cables?  No    Has compatible battery clips?  No      Pain Assessment   Currently in Pain?  No/denies    Pain Score  0-No pain    Multiple Pain Sites  No       Capillary Blood Glucose: No results found for this or any previous visit (from the past 24 hour(s)).    Social History   Tobacco Use  Smoking Status Never Smoker  Smokeless Tobacco Never Used  Tobacco Comment   tobacco use - no    Goals Met:  Independence with exercise equipment Exercise tolerated well No report of cardiac concerns or symptoms Strength training completed today  Goals Unmet:  Not Applicable  Comments: Pt able to follow exercise prescription today without complaint.  Will continue to monitor for progression. Checked out at 4:30pm.    Dr. Kate Sable is Medical Director for [place] and Pulmonary Rehab.

## 2018-01-17 ENCOUNTER — Ambulatory Visit: Payer: Medicare Other | Admitting: Cardiology

## 2018-01-17 ENCOUNTER — Encounter (HOSPITAL_COMMUNITY)
Admission: RE | Admit: 2018-01-17 | Discharge: 2018-01-17 | Disposition: A | Discharge status: Home / Self Care | Payer: Medicare Other | Source: Ambulatory Visit | Attending: Cardiology | Admitting: Cardiology

## 2018-01-17 DIAGNOSIS — Z951 Presence of aortocoronary bypass graft: Secondary | ICD-10-CM

## 2018-01-17 NOTE — Progress Notes (Signed)
Daily Session Note  Patient Details  Name: Eric Beltran. MRN: [id number] Date of Birth: 1941-02-02 Referring Provider:     CARDIAC REHAB PHASE II ORIENTATION from 10/22/2017 in [place]  Referring Provider  Dr. Domenic Polite      Encounter Date: 01/17/2018  Check In: Session Check In - 01/17/18 1545      Check-In   Supervising physician immediately available to respond to emergencies  See telemetry face sheet for immediately available MD    Location  [facility]    Staff Present  Russella Dar, MS, EP, [facility], Exercise Physiologist;Debra Wynetta Emery, RN, BSN;Other    Medication changes reported      No    Fall or balance concerns reported     No    Tobacco Cessation  No Change    Warm-up and Cool-down  Performed as group-led instruction    Resistance Training Performed  Yes    VAD Patient?  No    PAD/SET Patient?  No      VAD patient   Has back up controller?  No    Has spare charged batteries?  No    Has battery cables?  No    Has compatible battery clips?  No    Comments  Does not have LVAD      Pain Assessment   Currently in Pain?  No/denies    Pain Score  0-No pain    Multiple Pain Sites  No       Capillary Blood Glucose: No results found for this or any previous visit (from the past 24 hour(s)).    Social History   Tobacco Use  Smoking Status Never Smoker  Smokeless Tobacco Never Used  Tobacco Comment   tobacco use - no    Goals Met:  Proper associated with RPD/PD & O2 Sat Independence with exercise equipment Exercise tolerated well Personal goals reviewed No report of cardiac concerns or symptoms Strength training completed today  Goals Unmet:  Not Applicable  Comments: Pt able to follow exercise prescription today without complaint.  Will continue to monitor for progression. Check out: 4:45   Dr. Kate Sable is Medical Director for [place] and Pulmonary Rehab.

## 2018-01-20 ENCOUNTER — Encounter (HOSPITAL_COMMUNITY)
Admission: RE | Admit: 2018-01-20 | Discharge: 2018-01-20 | Disposition: A | Discharge status: Home / Self Care | Payer: Medicare Other | Source: Ambulatory Visit | Attending: Cardiology | Admitting: Cardiology

## 2018-01-20 VITALS — Ht 67.0 in | Wt 191.1 lb

## 2018-01-20 DIAGNOSIS — Z951 Presence of aortocoronary bypass graft: Secondary | ICD-10-CM

## 2018-01-20 NOTE — Progress Notes (Signed)
Daily Session Note  Patient Details  Name: Eric Beltran. MRN: [id number] Date of Birth: [birth year] Referring Provider:     CARDIAC REHAB PHASE II ORIENTATION from 10/22/2017 in [place]  Referring Provider  Dr. Domenic Polite      Encounter Date: 01/20/2018  Check In: Session Check In - 01/20/18 1530      Check-In   Supervising physician immediately available to respond to emergencies  See telemetry face sheet for immediately available MD    Location  [facility]    Staff Present  Russella Dar, MS, EP, [facility], Exercise Physiologist;Debra Wynetta Emery, RN, BSN;Other    Medication changes reported      No    Fall or balance concerns reported     No    Tobacco Cessation  No Change    Warm-up and Cool-down  Performed as group-led instruction    Resistance Training Performed  Yes    VAD Patient?  No    PAD/SET Patient?  No      Pain Assessment   Currently in Pain?  No/denies    Pain Score  0-No pain    Multiple Pain Sites  No       Capillary Blood Glucose: No results found for this or any previous visit (from the past 24 hour(s)).    Social History   Tobacco Use  Smoking Status Never Smoker  Smokeless Tobacco Never Used  Tobacco Comment   tobacco use - no    Goals Met:  Independence with exercise equipment Exercise tolerated well No report of cardiac concerns or symptoms Strength training completed today  Goals Unmet:  Not Applicable  Comments: Pt able to follow exercise prescription today without complaint.  Will continue to monitor for progression. Checked out at 4:45.   Dr. Kate Sable is Medical Director for [facility] Cardiac and Pulmonary Rehab.

## 2018-01-22 ENCOUNTER — Encounter (HOSPITAL_COMMUNITY): Payer: Medicare Other

## 2018-01-22 NOTE — Progress Notes (Signed)
Discharge Progress Report  Patient Details  Name: Eric Beltran. MRN: [id number] Date of Birth: 10-24-1940 Referring Provider:     CARDIAC REHAB PHASE II ORIENTATION from 10/22/2017 in [place]  Referring Provider  Dr. Domenic Polite       Number of Visits: 36  Reason for Discharge:  Patient reached a stable level of exercise. Patient independent in their exercise. Patient has met program and personal goals.  Smoking History:  Social History   Tobacco Use  Smoking Status Never Smoker  Smokeless Tobacco Never Used  Tobacco Comment   tobacco use - no    Diagnosis:  S/P CABG x 4  ADL UCSD:   Initial Exercise Prescription: Initial Exercise Prescription - 10/22/17 1400      Date of Initial Exercise RX and Referring Provider   Date  10/22/17    Referring Provider  Dr. Domenic Polite      NuStep   Level  1    SPM  73    Minutes  15    METs  2.3      Recumbant Elliptical   Level  1    RPM  51    Watts  64    Minutes  20    METs  3.6      Prescription Details   Frequency (times per week)  3    Duration  Progress to 30 minutes of continuous aerobic without signs/symptoms of physical distress      Intensity   THRR 40-80% of Max Heartrate  96-112-129    Ratings of Perceived Exertion  11-13    Perceived Dyspnea  0-4      Progression   Progression  Continue progressive overload as per policy without signs/symptoms or physical distress.      Resistance Training   Training Prescription  Yes    Weight  1    Reps  10-15       Discharge Exercise Prescription (Final Exercise Prescription Changes): Exercise Prescription Changes - 01/17/18 0900      Response to Exercise   Blood Pressure (Admit)  132/60    Blood Pressure (Exercise)  160/70    Blood Pressure (Exit)  130/80    Heart Rate (Admit)  63 bpm    Heart Rate (Exercise)  95 bpm    Heart Rate (Exit)  78 bpm    Rating of Perceived Exertion (Exercise)  12    Duration  Progress to 30  minutes of  aerobic without signs/symptoms of physical distress    Intensity  THRR New   95-111-128     Progression   Progression  Continue to progress workloads to maintain intensity without signs/symptoms of physical distress.    Average METs  4.55      Resistance Training   Training Prescription  Yes    Weight  5    Reps  10-15    Time  5 Minutes      NuStep   Level  4    SPM  107    Minutes  17    METs  3.6      Recumbant Elliptical   Level  4    RPM  67    Watts  97    Minutes  22    METs  5.5      Home Exercise Plan   Plans to continue exercise at  Home (comment)    Frequency  Add 2 additional days to program exercise sessions.  Initial Home Exercises Provided  11/01/17       Functional Capacity: 6 Minute Walk    Row Name 10/22/17 1431 01/22/18 0944       6 Minute Walk   Phase  Initial  Discharge    Distance  1400 feet  1750 feet    Distance % Change  0 %  25 %    Distance Feet Change  0 ft  350 ft    Walk Time  6 minutes  6 minutes    # of Rest Breaks  0  0    MPH  2.65  3.31    METS  3.03  3.54    RPE  11  12    Perceived Dyspnea   11  13    VO2 Peak  9.26  13.47    Symptoms  No  No    Resting HR  63 bpm  64 bpm    Resting BP  122/60  120/58    Resting Oxygen Saturation   94 %  96 %    Exercise Oxygen Saturation  during 6 min walk  91 %  96 %    Max Ex. HR  86 bpm  112 bpm    Max Ex. BP  150/66  188/82    2 Minute Post BP  124/62  180/74       Psychological, QOL, Others - Outcomes: PHQ 2/9: Depression screen [facility] 2/9 01/22/2018 10/22/2017 02/07/2012  Decreased Interest 0 0 0  Down, Depressed, Hopeless 0 0 0  PHQ - 2 Score 0 0 0  Altered sleeping 0 1 -  Tired, decreased energy 1 1 -  Change in appetite 1 1 -  Feeling bad or failure about yourself  0 0 -  Trouble concentrating 1 1 -  Moving slowly or fidgety/restless 0 0 -  Suicidal thoughts 0 0 -  PHQ-9 Score 3 4 -  Difficult doing work/chores - Not difficult at all -    Quality of  Life: Quality of Life - 01/22/18 0949      Quality of Life   Select  Quality of Life      Quality of Life Scores   Health/Function Pre  26.3 %    Health/Function Post  25.73 %    Health/Function % Change  -2.17 %    Socioeconomic Pre  23.31 %    Socioeconomic Post  22.25 %    Socioeconomic % Change   -4.55 %    Psych/Spiritual Pre  21.71 %    Psych/Spiritual Post  22.5 %    Psych/Spiritual % Change  3.64 %    Family Pre  25.2 %    Family Post  25.2 %    Family % Change  0 %    GLOBAL Pre  24.54 %    GLOBAL Post  24.33 %    GLOBAL % Change  -0.86 %       Personal Goals: Goals established at orientation with interventions provided to work toward goal. Personal Goals and Risk Factors at Admission - 10/22/17 1420      Core Components/Risk Factors/Patient Goals on Admission    Weight Management  Yes    Intervention  Weight Management/Obesity: Establish reasonable short term and long term weight goals.    Admit Weight  191 lb 4.8 oz (86.8 kg)    Goal Weight: Short Term  186 lb 4.8 oz (84.5 kg)    Goal Weight: Long Term  181  lb 4.8 oz (82.2 kg)    Expected Outcomes  Short Term: Continue to assess and modify interventions until short term weight is achieved;Long Term: Adherence to nutrition and physical activity/exercise program aimed toward attainment of established weight goal    Personal Goal Other  Yes    Personal Goal  Build Stamina, Get in shap and stay in shape.     Intervention  Attend CR 3 x week and supplement with home exercise 2xweek.     Expected Outcomes  Reach personal goals.         Personal Goals Discharge: Goals and Risk Factor Review    Row Name 11/20/17 0735 12/18/17 1451 01/03/18 1442 01/22/18 1105       Core Components/Risk Factors/Patient Goals Review   Personal Goals Review  Weight Management/Obesity Build stamina; get in shape; stay in shape.  Weight Management/Obesity Build stamina; get in shape; stay in shape.   Weight Management/Obesity Build  stamina; get in shape; stay in shape.  Weight Management/Obesity Build stamina; get in shape; stay in shape.     Review  Patient has completed 13 sessions gaining 6 lbs since he started the program. He says he stills needs to work on his eating with portion sizes and making healthier choices. He says he does feel stronger since he started and like being in an exercise program. He hopes to get stronger and get in shape as he continues. Will continue to monitor for progress.   Patient has completed 24 sessions gaining 2 lbs since last 30 day review. He continues to do well in the program with progression. He says he feels like he is getting in shape and feels like his upper body is getting toned. He feels he has more stamina and energy and feels stronger and better overall. Will continue to monitor for progress.   Patient has completed 30 sessions maintaining his weight since last 30 day review. He continues to do well in the program with progression. He continues to say he feels stronger and feels like he is getting in shape. Patient saw his cardiologist on 12/25/17 due experiencing angina symptoms and elevated blood pressure during his sessions. MD increased Lisonopril from 2.5 mg to 5 mg daily. He denies angina now but his blood pressure remains elevated. Will continue to monitor.   Patient graduated with 36 sessions gaining 2 lbs overall. He did well in the program overall. He admits he still needs to work on his diet. His diet assessment score did not improved but increased by 4. He says he feels better and stronger overall and thinks the program helped him meet his goals. He feels like he is getting in shape. He blood pressue has improved by continues to fluctuate. He continues to have some chest tightness with increased exercise. We have sent Dr. Domenic Polite a note about this and he says for him to continue taking his SL nitroglycerin when this occurrs. Patient made aware. He improved in his exit measurements in  grip strength, balance and flexibility. His exit walk test improved by 25%. He plans to join our forever fit maintenance program to continue exercising. CR will f/u for one year.     Expected Outcomes  Patient will continue to attend sessions and complete the program.   Patient will continue to attend sessions and complete the program.   Patient will continue to attend sessions and complete the program.   Patient plans to join our forever fit maintenance program to continue exercising and continue  to meet his personal goals.        Exercise Goals and Review: Exercise Goals    Row Name 10/22/17 1433             Exercise Goals   Increase Physical Activity  Yes       Intervention  Provide advice, education, support and counseling about physical activity/exercise needs.;Develop an individualized exercise prescription for aerobic and resistive training based on initial evaluation findings, risk stratification, comorbidities and participant's personal goals.       Expected Outcomes  Short Term: Attend rehab on a regular basis to increase amount of physical activity.       Increase Strength and Stamina  Yes       Intervention  Provide advice, education, support and counseling about physical activity/exercise needs.;Develop an individualized exercise prescription for aerobic and resistive training based on initial evaluation findings, risk stratification, comorbidities and participant's personal goals.       Expected Outcomes  Short Term: Increase workloads from initial exercise prescription for resistance, speed, and METs.       Able to understand and use rate of perceived exertion (RPE) scale  Yes       Intervention  Provide education and explanation on how to use RPE scale       Expected Outcomes  Short Term: Able to use RPE daily in rehab to express subjective intensity level;Long Term:  Able to use RPE to guide intensity level when exercising independently       Able to understand and use Dyspnea  scale  Yes       Intervention  Provide education and explanation on how to use Dyspnea scale       Expected Outcomes  Short Term: Able to use Dyspnea scale daily in rehab to express subjective sense of shortness of breath during exertion;Long Term: Able to use Dyspnea scale to guide intensity level when exercising independently       Knowledge and understanding of Target Heart Rate Range (THRR)  Yes       Intervention  Provide education and explanation of THRR including how the numbers were predicted and where they are located for reference       Expected Outcomes  Short Term: Able to state/look up THRR;Long Term: Able to use THRR to govern intensity when exercising independently;Short Term: Able to use daily as guideline for intensity in rehab       Able to check pulse independently  Yes       Intervention  Provide education and demonstration on how to check pulse in carotid and radial arteries.;Review the importance of being able to check your own pulse for safety during independent exercise       Expected Outcomes  Short Term: Able to explain why pulse checking is important during independent exercise;Long Term: Able to check pulse independently and accurately       Understanding of Exercise Prescription  Yes       Intervention  Provide education, explanation, and written materials on patient's individual exercise prescription       Expected Outcomes  Short Term: Able to explain program exercise prescription;Long Term: Able to explain home exercise prescription to exercise independently          Nutrition & Weight - Outcomes: Pre Biometrics - 10/22/17 1433      Pre Biometrics   Height  5' 7" (1.702 m)    Weight  86.8 kg    Waist Circumference  39.5 inches  Hip Circumference  38.5 inches    Waist to Hip Ratio  1.03 %    BMI (Calculated)  29.95    Triceps Skinfold  6 mm    % Body Fat  24.8 %    Grip Strength  73.46 kg    Flexibility  15 in    Single Leg Stand  5 seconds      Post  Biometrics - 01/22/18 0946       Post  Biometrics   Height  5' 7" (1.702 m)    Weight  86.7 kg    Waist Circumference  38 inches    Hip Circumference  37.5 inches    Waist to Hip Ratio  1.01 %    BMI (Calculated)  29.93    Triceps Skinfold  4 mm    % Body Fat  21.4 %    Grip Strength  77.4 kg    Flexibility  14.8 in    Single Leg Stand  8 seconds       Nutrition: Nutrition Therapy & Goals - 01/08/18 1425      Nutrition Therapy   RD appointment deferred  --      Personal Nutrition Goals   Personal Goal #2  --    Additional Goals?  --       Nutrition Discharge: Nutrition Assessments - 01/22/18 1105      MEDFICTS Scores   Pre Score  58    Post Score  62    Score Difference  4       Education Questionnaire Score: Knowledge Questionnaire Score - 01/22/18 1104      Knowledge Questionnaire Score   Pre Score  24/24    Post Score  23/24       Goals reviewed with patient; copy given to patient.

## 2018-01-22 NOTE — Progress Notes (Signed)
Cardiac Individual Treatment Plan  Patient Details  Name: Eric Beltran. MRN: [id number] Date of Birth: [birth year] Referring Provider:     CARDIAC REHAB PHASE II ORIENTATION from 10/22/2017 in [place]  Referring Provider  Dr. Domenic Polite      Initial Encounter Date:    CARDIAC REHAB PHASE II ORIENTATION from 10/22/2017 in [place]  Date  10/22/17      Visit Diagnosis: S/P CABG x 4  Patient's Home Medications on Admission:  Current Outpatient Medications:  .  amiodarone (PACERONE) 200 MG tablet, TAKE 1/2 TABLET BY MOUTH DAILY (CUT IN HALF FOR PATIENT), Disp: 45 tablet, Rfl: 6 .  aspirin EC 81 MG tablet, Take 81 mg by mouth daily., Disp: , Rfl:  .  atorvastatin (LIPITOR) 40 MG tablet, Take 1 tablet (40 mg total) by mouth at bedtime., Disp: 90 tablet, Rfl: 3 .  furosemide (LASIX) 20 MG tablet, Take 2 tablets (40 mg total) by mouth daily. (Patient taking differently: Take 40 mg by mouth daily as needed for fluid. ), Disp: 180 tablet, Rfl: 3 .  lisinopril (PRINIVIL,ZESTRIL) 5 MG tablet, Take 1 tablet (5 mg total) by mouth daily., Disp: 90 tablet, Rfl: 3 .  metoprolol tartrate (LOPRESSOR) 25 MG tablet, TAKE 1 TABLET BY MOUTH TWICE DAILY, Disp: 60 tablet, Rfl: 6 .  Multiple Vitamin (MULTIVITAMIN) tablet, Take 1 tablet by mouth at bedtime. , Disp: , Rfl:  .  Omega-3 Fatty Acids (FISH OIL) 1000 MG CAPS, Take 1,000 capsules by mouth daily., Disp: , Rfl:  .  Tamsulosin HCl (FLOMAX) 0.4 MG CAPS, Take 0.4 mg by mouth daily. , Disp: , Rfl:   Past Medical History: Past Medical History:  Diagnosis Date  . Arthritis   . Ascending aortic aneurysm [facility])    Status post repair 12/2011 at [facility]  . BPH (benign prostatic hyperplasia)   . Chronic diastolic CHF (congestive heart failure) ([place])   . Coronary atherosclerosis of native coronary artery    a. Multivessel status post prior stenting and ultimately CABG 12/2011 at [facility] with with LIMA to LAD,  SVG to PLB, SVG to ramus, SVG to OM . b. Last cath 06/2014 (following ischemic nuc) -> medical therapy, no feasible way to revascularize LCx territory.  . Essential hypertension   . Mixed hyperlipidemia   . Myocardial infarction ([place])   . Postoperative atrial fibrillation ([place])   . Supraventricular tachycardia ([place])     Tobacco Use: Social History   Tobacco Use  Smoking Status Never Smoker  Smokeless Tobacco Never Used  Tobacco Comment   tobacco use - no    Labs: Recent Review Flowsheet Data    Labs for ITP Cardiac and Pulmonary Rehab Latest Ref Rng & Units 08/06/2017 08/07/2017 08/07/2017 08/07/2017 08/08/2017   Cholestrol 0 - 200 mg/dL - - - - -   LDLCALC 0 - 99 mg/dL - - - - -   HDL >40 mg/dL - - - - -   Trlycerides <150 mg/dL - - - - -   Hemoglobin A1c 4.8 - 5.6 % - - - - -   PHART 7.350 - 7.450 - 7.388 7.349(L) - -   PCO2ART 32.0 - 48.0 mmHg - 38.5 42.6 - -   HCO3 20.0 - 28.0 mmol/L - 23.1 23.3 - -   TCO2 22 - 32 mmol/L '22 24 25 22 26   '$ ACIDBASEDEF 0.0 - 2.0 mmol/L - 2.0 2.0 - -   O2SAT % - 98.0 93.0 - -  Capillary Blood Glucose: Lab Results  Component Value Date   GLUCAP 114 (H) 08/08/2017   GLUCAP 139 (H) 08/08/2017   GLUCAP 152 (H) 08/07/2017   GLUCAP 151 (H) 08/07/2017   GLUCAP 120 (H) 08/07/2017     Exercise Target Goals: Exercise Program Goal: Individual exercise prescription set using results from initial 6 min walk test and THRR while considering  patient's activity barriers and safety.   Exercise Prescription Goal: Starting with aerobic activity 30 plus minutes a day, 3 days per week for initial exercise prescription. Provide home exercise prescription and guidelines that participant acknowledges understanding prior to discharge.  Activity Barriers & Risk Stratification: Activity Barriers & Cardiac Risk Stratification - 10/22/17 1432      Activity Barriers & Cardiac Risk Stratification   Activity Barriers  None    Cardiac Risk Stratification  High        6 Minute Walk: 6 Minute Walk    Row Name 10/22/17 1431 01/22/18 0944       6 Minute Walk   Phase  Initial  Discharge    Distance  1400 feet  1750 feet    Distance % Change  0 %  25 %    Distance Feet Change  0 ft  350 ft    Walk Time  6 minutes  6 minutes    # of Rest Breaks  0  0    MPH  2.65  3.31    METS  3.03  3.54    RPE  11  12    Perceived Dyspnea   11  13    VO2 Peak  9.26  13.47    Symptoms  No  No    Resting HR  63 bpm  64 bpm    Resting BP  122/60  120/58    Resting Oxygen Saturation   94 %  96 %    Exercise Oxygen Saturation  during 6 min walk  91 %  96 %    Max Ex. HR  86 bpm  112 bpm    Max Ex. BP  150/66  188/82    2 Minute Post BP  124/62  180/74       Oxygen Initial Assessment:   Oxygen Re-Evaluation:   Oxygen Discharge (Final Oxygen Re-Evaluation):   Initial Exercise Prescription: Initial Exercise Prescription - 10/22/17 1400      Date of Initial Exercise RX and Referring Provider   Date  10/22/17    Referring Provider  Dr. Domenic Polite      NuStep   Level  1    SPM  73    Minutes  15    METs  2.3      Recumbant Elliptical   Level  1    RPM  51    Watts  64    Minutes  20    METs  3.6      Prescription Details   Frequency (times per week)  3    Duration  Progress to 30 minutes of continuous aerobic without signs/symptoms of physical distress      Intensity   THRR 40-80% of Max Heartrate  96-112-129    Ratings of Perceived Exertion  11-13    Perceived Dyspnea  0-4      Progression   Progression  Continue progressive overload as per policy without signs/symptoms or physical distress.      Resistance Training   Training Prescription  Yes    Weight  1  Reps  10-15       Perform Capillary Blood Glucose checks as needed.  Exercise Prescription Changes:  Exercise Prescription Changes    Row Name 10/28/17 1400 11/11/17 1500 11/30/17 1200 12/12/17 1700 01/03/18 0700     Response to Exercise   Blood Pressure (Admit)   160/70  132/60  138/68  162/70  150/72   Blood Pressure (Exercise)  206/84  180/78  160/72  200/70  170/80   Blood Pressure (Exit)  160/72  122/60  130/74  138/76  160/80   Heart Rate (Admit)  69 bpm  66 bpm  68 bpm  64 bpm  60 bpm   Heart Rate (Exercise)  97 bpm  90 bpm  93 bpm  91 bpm  87 bpm   Heart Rate (Exit)  78 bpm  54 bpm  77 bpm  73 bpm  70 bpm   Rating of Perceived Exertion (Exercise)  '12  13  13  12  12   '$ Duration  Progress to 30 minutes of  aerobic without signs/symptoms of physical distress  Progress to 30 minutes of  aerobic without signs/symptoms of physical distress  Progress to 30 minutes of  aerobic without signs/symptoms of physical distress  Progress to 30 minutes of  aerobic without signs/symptoms of physical distress  Progress to 30 minutes of  aerobic without signs/symptoms of physical distress   Intensity  THRR New 99-114-130  THRR New 98-113-129  THRR New 102-119-136  THRR New 96-112-128  THRR New 94-11-127     Progression   Progression  Continue to progress workloads to maintain intensity without signs/symptoms of physical distress.  Continue to progress workloads to maintain intensity without signs/symptoms of physical distress.  Continue to progress workloads to maintain intensity without signs/symptoms of physical distress.  Continue to progress workloads to maintain intensity without signs/symptoms of physical distress.  Continue to progress workloads to maintain intensity without signs/symptoms of physical distress.   Average METs  -  -  5.65  5.15  4.8     Resistance Training   Training Prescription  Yes  Yes  Yes  Yes  Yes   Weight  '1  3  4  5  5   '$ Reps  10-15  10-15  10-15  10-15  10-15   Time  -  -  5 Minutes  5 Minutes  5 Minutes     NuStep   Level  '1  1  2  2  3   '$ SPM  111  127  129  129  112   Minutes  '15  15  15  15  17   '$ METs  3.4  4.9  4.8  5  3.9     Recumbant Elliptical   Level  '1  1  1  2  3   '$ RPM  70  73  78  72  72   Watts  101  108  122  98   106   Minutes  '20  20  20  20  22   '$ METs  5.5  5.8  6.5  5.3  5.7     Home Exercise Plan   Plans to continue exercise at  -  -  Home (comment)  Home (comment)  Home (comment)   Frequency  -  -  Add 2 additional days to program exercise sessions.  Add 2 additional days to program exercise sessions.  Add 2 additional days to program exercise sessions.  Initial Home Exercises Provided  -  -  11/01/17  11/01/17  11/01/17   Row Name 01/17/18 0900             Response to Exercise   Blood Pressure (Admit)  132/60       Blood Pressure (Exercise)  160/70       Blood Pressure (Exit)  130/80       Heart Rate (Admit)  63 bpm       Heart Rate (Exercise)  95 bpm       Heart Rate (Exit)  78 bpm       Rating of Perceived Exertion (Exercise)  12       Duration  Progress to 30 minutes of  aerobic without signs/symptoms of physical distress       Intensity  THRR New 95-111-128         Progression   Progression  Continue to progress workloads to maintain intensity without signs/symptoms of physical distress.       Average METs  4.55         Resistance Training   Training Prescription  Yes       Weight  5       Reps  10-15       Time  5 Minutes         NuStep   Level  4       SPM  107       Minutes  17       METs  3.6         Recumbant Elliptical   Level  4       RPM  67       Watts  97       Minutes  22       METs  5.5         Home Exercise Plan   Plans to continue exercise at  Home (comment)       Frequency  Add 2 additional days to program exercise sessions.       Initial Home Exercises Provided  11/01/17          Exercise Comments:  Exercise Comments    Row Name 10/28/17 1452 11/11/17 1510 12/12/17 1739 01/08/18 0740     Exercise Comments  Patient has just started CR and will be progressed in time.   Patient has been doing very well in CR. patient has increase his dumbbell size to 3lbs and has also increased the RPMs and SPMs on his machines by a high amount. Patient will  continued to be monitored in CR.   Patient has done well so far in the program. Enjoys coming and feels the fellowship is as important as the exercise.   Patient has done well so far in the program. Enjoys coming and feels the fellowship is as important as the exercise.        Exercise Goals and Review:  Exercise Goals    Row Name 10/22/17 1433             Exercise Goals   Increase Physical Activity  Yes       Intervention  Provide advice, education, support and counseling about physical activity/exercise needs.;Develop an individualized exercise prescription for aerobic and resistive training based on initial evaluation findings, risk stratification, comorbidities and participant's personal goals.       Expected Outcomes  Short Term: Attend rehab on a regular basis to increase amount of physical  activity.       Increase Strength and Stamina  Yes       Intervention  Provide advice, education, support and counseling about physical activity/exercise needs.;Develop an individualized exercise prescription for aerobic and resistive training based on initial evaluation findings, risk stratification, comorbidities and participant's personal goals.       Expected Outcomes  Short Term: Increase workloads from initial exercise prescription for resistance, speed, and METs.       Able to understand and use rate of perceived exertion (RPE) scale  Yes       Intervention  Provide education and explanation on how to use RPE scale       Expected Outcomes  Short Term: Able to use RPE daily in rehab to express subjective intensity level;Long Term:  Able to use RPE to guide intensity level when exercising independently       Able to understand and use Dyspnea scale  Yes       Intervention  Provide education and explanation on how to use Dyspnea scale       Expected Outcomes  Short Term: Able to use Dyspnea scale daily in rehab to express subjective sense of shortness of breath during exertion;Long Term: Able to  use Dyspnea scale to guide intensity level when exercising independently       Knowledge and understanding of Target Heart Rate Range (THRR)  Yes       Intervention  Provide education and explanation of THRR including how the numbers were predicted and where they are located for reference       Expected Outcomes  Short Term: Able to state/look up THRR;Long Term: Able to use THRR to govern intensity when exercising independently;Short Term: Able to use daily as guideline for intensity in rehab       Able to check pulse independently  Yes       Intervention  Provide education and demonstration on how to check pulse in carotid and radial arteries.;Review the importance of being able to check your own pulse for safety during independent exercise       Expected Outcomes  Short Term: Able to explain why pulse checking is important during independent exercise;Long Term: Able to check pulse independently and accurately       Understanding of Exercise Prescription  Yes       Intervention  Provide education, explanation, and written materials on patient's individual exercise prescription       Expected Outcomes  Short Term: Able to explain program exercise prescription;Long Term: Able to explain home exercise prescription to exercise independently          Exercise Goals Re-Evaluation : Exercise Goals Re-Evaluation    Row Name 11/15/17 1458 12/12/17 1736 01/08/18 0732         Exercise Goal Re-Evaluation   Exercise Goals Review  Increase Physical Activity;Increase Strength and Stamina;Able to understand and use rate of perceived exertion (RPE) scale;Understanding of Exercise Prescription;Able to understand and use Dyspnea scale;Knowledge and understanding of Target Heart Rate Range (THRR);Improve claudication pain tolerance and improve walking ability;Able to check pulse independently  Increase Strength and Stamina  Increase Physical Activity;Increase Strength and Stamina     Comments  Patient has been  doing well in CR. patient has stated to me that he has an increase of stamina and has gained some stamina from beginning CR. This has helped hom in tasks when he is not here in CR.   Patient has progressed at a steady rate. He  has been able to handle the increases in weights and workloads on the equipment. Patient feels like hehas increase his stamina, that he is getting in shape and that his arms are becoming toned.   Patient has progressed nicely. He has recently been concerned about his reoccuring chest pain after exercise in the program and while exercisng at home. I have relayed this message to his cardiologist and his doctor has worked on his medications. Will continue to monitor this and report any future occurances to his doctor.      Expected Outcomes  Patient wishes to gain stamina back and get back into shape.   To continue to gain stamina and get in shape  Build stamina, Get in shape and stay in shape.         Discharge Exercise Prescription (Final Exercise Prescription Changes): Exercise Prescription Changes - 01/17/18 0900      Response to Exercise   Blood Pressure (Admit)  132/60    Blood Pressure (Exercise)  160/70    Blood Pressure (Exit)  130/80    Heart Rate (Admit)  63 bpm    Heart Rate (Exercise)  95 bpm    Heart Rate (Exit)  78 bpm    Rating of Perceived Exertion (Exercise)  12    Duration  Progress to 30 minutes of  aerobic without signs/symptoms of physical distress    Intensity  THRR New   95-111-128     Progression   Progression  Continue to progress workloads to maintain intensity without signs/symptoms of physical distress.    Average METs  4.55      Resistance Training   Training Prescription  Yes    Weight  5    Reps  10-15    Time  5 Minutes      NuStep   Level  4    SPM  107    Minutes  17    METs  3.6      Recumbant Elliptical   Level  4    RPM  67    Watts  97    Minutes  22    METs  5.5      Home Exercise Plan   Plans to continue  exercise at  Home (comment)    Frequency  Add 2 additional days to program exercise sessions.    Initial Home Exercises Provided  11/01/17       Nutrition:  Target Goals: Understanding of nutrition guidelines, daily intake of sodium '1500mg'$ , cholesterol '200mg'$ , calories 30% from fat and 7% or less from saturated fats, daily to have 5 or more servings of fruits and vegetables.  Biometrics: Pre Biometrics - 10/22/17 1433      Pre Biometrics   Height  '5\' 7"'$  (1.702 m)    Weight  86.8 kg    Waist Circumference  39.5 inches    Hip Circumference  38.5 inches    Waist to Hip Ratio  1.03 %    BMI (Calculated)  29.95    Triceps Skinfold  6 mm    % Body Fat  24.8 %    Grip Strength  73.46 kg    Flexibility  15 in    Single Leg Stand  5 seconds      Post Biometrics - 01/22/18 0946       Post  Biometrics   Height  '5\' 7"'$  (1.702 m)    Weight  86.7 kg    Waist Circumference  38 inches  Hip Circumference  37.5 inches    Waist to Hip Ratio  1.01 %    BMI (Calculated)  29.93    Triceps Skinfold  4 mm    % Body Fat  21.4 %    Grip Strength  77.4 kg    Flexibility  14.8 in    Single Leg Stand  8 seconds       Nutrition Therapy Plan and Nutrition Goals: Nutrition Therapy & Goals - 01/08/18 1425      Nutrition Therapy   RD appointment deferred  --      Personal Nutrition Goals   Personal Goal #2  --    Additional Goals?  --       Nutrition Assessments: Nutrition Assessments - 01/22/18 1105      MEDFICTS Scores   Pre Score  58    Post Score  62    Score Difference  4       Nutrition Goals Re-Evaluation:   Nutrition Goals Discharge (Final Nutrition Goals Re-Evaluation):   Psychosocial: Target Goals: Acknowledge presence or absence of significant depression and/or stress, maximize coping skills, provide positive support system. Participant is able to verbalize types and ability to use techniques and skills needed for reducing stress and depression.  Initial Review &  Psychosocial Screening: Initial Psych Review & Screening - 10/22/17 1424      Initial Review   Current issues with  None Identified      Family Dynamics   Good Support System?  Yes      Barriers   Psychosocial barriers to participate in program  There are no identifiable barriers or psychosocial needs.      Screening Interventions   Interventions  Encouraged to exercise       Quality of Life Scores: Quality of Life - 01/22/18 0949      Quality of Life   Select  Quality of Life      Quality of Life Scores   Health/Function Pre  26.3 %    Health/Function Post  25.73 %    Health/Function % Change  -2.17 %    Socioeconomic Pre  23.31 %    Socioeconomic Post  22.25 %    Socioeconomic % Change   -4.55 %    Psych/Spiritual Pre  21.71 %    Psych/Spiritual Post  22.5 %    Psych/Spiritual % Change  3.64 %    Family Pre  25.2 %    Family Post  25.2 %    Family % Change  0 %    GLOBAL Pre  24.54 %    GLOBAL Post  24.33 %    GLOBAL % Change  -0.86 %      Scores of 19 and below usually indicate a poorer quality of life in these areas.  A difference of  2-3 points is a clinically meaningful difference.  A difference of 2-3 points in the total score of the Quality of Life Index has been associated with significant improvement in overall quality of life, self-image, physical symptoms, and general health in studies assessing change in quality of life.  PHQ-9: Recent Review Flowsheet Data    Depression screen [facility] 2/9 01/22/2018 10/22/2017 02/07/2012   Decreased Interest 0 0 0   Down, Depressed, Hopeless 0 0 0   PHQ - 2 Score 0 0 0   Altered sleeping 0 1 -   Tired, decreased energy 1 1 -   Change in appetite 1 1 -  Feeling bad or failure about yourself  0 0 -   Trouble concentrating 1 1 -   Moving slowly or fidgety/restless 0 0 -   Suicidal thoughts 0 0 -   PHQ-9 Score 3 4 -   Difficult doing work/chores - Not difficult at all -     Interpretation of Total Score  Total Score  Depression Severity:  1-4 = Minimal depression, 5-9 = Mild depression, 10-14 = Moderate depression, 15-19 = Moderately severe depression, 20-27 = Severe depression   Psychosocial Evaluation and Intervention: Psychosocial Evaluation - 01/22/18 1111      Discharge Psychosocial Assessment & Intervention   Comments  Patient has no psychosocial issues identified at discharge. His exit QOL score decreased by .85% at 24.33% and his PHQ-9 score went from 4 to 3.       Psychosocial Re-Evaluation: Psychosocial Re-Evaluation    [place] Name 11/20/17 0737 12/18/17 1453 01/03/18 1447         Psychosocial Re-Evaluation   Current issues with  None Identified  None Identified  None Identified     Comments  Patients initial QOL score was 24.54 and his PHQ-9 score was 4 with no psychosocial issues identified.  Patients initial QOL score was 24.54 and his PHQ-9 score was 4 with no psychosocial issues identified.  Patients initial QOL score was 24.54 and his PHQ-9 score was 4 with no psychosocial issues identified.     Expected Outcomes  Patient will have no psychosocial issues identified at discharge.   Patient will have no psychosocial issues identified at discharge.   Patient will have no psychosocial issues identified at discharge.      Interventions  Relaxation education;Encouraged to attend Cardiac Rehabilitation for the exercise;Stress management education  Relaxation education;Encouraged to attend Cardiac Rehabilitation for the exercise;Stress management education  Relaxation education;Encouraged to attend Cardiac Rehabilitation for the exercise;Stress management education     Continue Psychosocial Services   No Follow up required  No Follow up required  No Follow up required        Psychosocial Discharge (Final Psychosocial Re-Evaluation): Psychosocial Re-Evaluation - 01/03/18 1447      Psychosocial Re-Evaluation   Current issues with  None Identified    Comments  Patients initial QOL score was 24.54  and his PHQ-9 score was 4 with no psychosocial issues identified.    Expected Outcomes  Patient will have no psychosocial issues identified at discharge.     Interventions  Relaxation education;Encouraged to attend Cardiac Rehabilitation for the exercise;Stress management education    Continue Psychosocial Services   No Follow up required       Vocational Rehabilitation: Provide vocational rehab assistance to qualifying candidates.   Vocational Rehab Evaluation & Intervention: Vocational Rehab - 10/22/17 1416      Initial Vocational Rehab Evaluation & Intervention   Assessment shows need for Vocational Rehabilitation  No       Education: Education Goals: Education classes will be provided on a weekly basis, covering required topics. Participant will state understanding/return demonstration of topics presented.  Learning Barriers/Preferences: Learning Barriers/Preferences - 10/22/17 1416      Learning Barriers/Preferences   Learning Barriers  None    Learning Preferences  Audio;Computer/Internet;Group Instruction;Individual Instruction;Pictoral;Skilled Demonstration;Verbal Instruction;Video;Written Material       Education Topics: Hypertension, Hypertension Reduction -Define heart disease and high blood pressure. Discus how high blood pressure affects the body and ways to reduce high blood pressure.   CARDIAC REHAB PHASE II EXERCISE from 01/15/2018 in [place]  REHABILITATION  Date  12/18/17  Educator  D. Coad  Instruction Review Code  2- Demonstrated Understanding      Exercise and Your Heart -Discuss why it is important to exercise, the FITT principles of exercise, normal and abnormal responses to exercise, and how to exercise safely.   CARDIAC REHAB PHASE II EXERCISE from 01/15/2018 in [place]  Date  12/25/17  Educator  D. Coad  Instruction Review Code  2- Demonstrated Understanding      Angina -Discuss definition of angina, causes of  angina, treatment of angina, and how to decrease risk of having angina.   CARDIAC REHAB PHASE II EXERCISE from 01/15/2018 in [place]  Date  01/01/18  Educator  D. Coad  Instruction Review Code  2- Demonstrated Understanding      Cardiac Medications -Review what the following cardiac medications are used for, how they affect the body, and side effects that may occur when taking the medications.  Medications include Aspirin, Beta blockers, calcium channel blockers, ACE Inhibitors, angiotensin receptor blockers, diuretics, digoxin, and antihyperlipidemics.   Congestive Heart Failure -Discuss the definition of CHF, how to live with CHF, the signs and symptoms of CHF, and how keep track of weight and sodium intake.   CARDIAC REHAB PHASE II EXERCISE from 01/15/2018 in [place]  Date  01/15/18  Educator  D. Coad  Instruction Review Code  2- Demonstrated Understanding      Heart Disease and Intimacy -Discus the effect sexual activity has on the heart, how changes occur during intimacy as we age, and safety during sexual activity.   CARDIAC REHAB PHASE II EXERCISE from 01/15/2018 in [place]  Date  10/23/17  Educator  DC  Instruction Review Code  2- Demonstrated Understanding      Smoking Cessation / COPD -Discuss different methods to quit smoking, the health benefits of quitting smoking, and the definition of COPD.   CARDIAC REHAB PHASE II EXERCISE from 01/15/2018 in [place]  Date  10/30/17  Educator  Fif[place]  Instruction Review Code  2- Demonstrated Understanding      Nutrition I: Fats -Discuss the types of cholesterol, what cholesterol does to the heart, and how cholesterol levels can be controlled.   CARDIAC REHAB PHASE II EXERCISE from 01/15/2018 in [place]  Date  11/06/17  Educator  DC  Instruction Review Code  2- Demonstrated Understanding      Nutrition II:  Labels -Discuss the different components of food labels and how to read food label   CARDIAC REHAB PHASE II EXERCISE from 01/15/2018 in [place]  Date  11/13/17  Educator  Etheleen Mayhew  Instruction Review Code  2- Demonstrated Understanding      Heart Parts/Heart Disease and PAD -Discuss the anatomy of the heart, the pathway of blood circulation through the heart, and these are affected by heart disease.   CARDIAC REHAB PHASE II EXERCISE from 01/15/2018 in [place]  Date  11/20/17  Educator  DC  Instruction Review Code  2- Demonstrated Understanding      Stress I: Signs and Symptoms -Discuss the causes of stress, how stress may lead to anxiety and depression, and ways to limit stress.   CARDIAC REHAB PHASE II EXERCISE from 01/15/2018 in [place]  Date  11/27/17  Educator  D. Coad  Instruction Review Code  2- Demonstrated Understanding      Stress II:  Relaxation -Discuss different types of relaxation techniques to limit stress.   CARDIAC REHAB PHASE II EXERCISE from 01/15/2018 in [place]  Date  12/04/17  Educator  D. Coad  Instruction Review Code  2- Demonstrated Understanding      Warning Signs of Stroke / TIA -Discuss definition of a stroke, what the signs and symptoms are of a stroke, and how to identify when someone is having stroke.   CARDIAC REHAB PHASE II EXERCISE from 01/15/2018 in [place]  Date  12/11/17  Educator  DC  Instruction Review Code  2- Demonstrated Understanding      Knowledge Questionnaire Score: Knowledge Questionnaire Score - 01/22/18 1104      Knowledge Questionnaire Score   Pre Score  24/24    Post Score  23/24       Core Components/Risk Factors/Patient Goals at Admission: Personal Goals and Risk Factors at Admission - 10/22/17 1420      Core Components/Risk Factors/Patient Goals on Admission    Weight Management  Yes     Intervention  Weight Management/Obesity: Establish reasonable short term and long term weight goals.    Admit Weight  191 lb 4.8 oz (86.8 kg)    Goal Weight: Short Term  186 lb 4.8 oz (84.5 kg)    Goal Weight: Long Term  181 lb 4.8 oz (82.2 kg)    Expected Outcomes  Short Term: Continue to assess and modify interventions until short term weight is achieved;Long Term: Adherence to nutrition and physical activity/exercise program aimed toward attainment of established weight goal    Personal Goal Other  Yes    Personal Goal  Build Stamina, Get in shap and stay in shape.     Intervention  Attend CR 3 x week and supplement with home exercise 2xweek.     Expected Outcomes  Reach personal goals.        Core Components/Risk Factors/Patient Goals Review:  Goals and Risk Factor Review    Row Name 11/20/17 0735 12/18/17 1451 01/03/18 1442 01/22/18 1105       Core Components/Risk Factors/Patient Goals Review   Personal Goals Review  Weight Management/Obesity Build stamina; get in shape; stay in shape.  Weight Management/Obesity Build stamina; get in shape; stay in shape.   Weight Management/Obesity Build stamina; get in shape; stay in shape.  Weight Management/Obesity Build stamina; get in shape; stay in shape.     Review  Patient has completed 13 sessions gaining 6 lbs since he started the program. He says he stills needs to work on his eating with portion sizes and making healthier choices. He says he does feel stronger since he started and like being in an exercise program. He hopes to get stronger and get in shape as he continues. Will continue to monitor for progress.   Patient has completed 24 sessions gaining 2 lbs since last 30 day review. He continues to do well in the program with progression. He says he feels like he is getting in shape and feels like his upper body is getting toned. He feels he has more stamina and energy and feels stronger and better overall. Will continue to monitor for  progress.   Patient has completed 30 sessions maintaining his weight since last 30 day review. He continues to do well in the program with progression. He continues to say he feels stronger and feels like he is getting in shape. Patient saw his cardiologist on 12/25/17 due experiencing angina symptoms and  elevated blood pressure during his sessions. MD increased Lisonopril from 2.5 mg to 5 mg daily. He denies angina now but his blood pressure remains elevated. Will continue to monitor.   Patient graduated with 36 sessions gaining 2 lbs overall. He did well in the program overall. He admits he still needs to work on his diet. His diet assessment score did not improved but increased by 4. He says he feels better and stronger overall and thinks the program helped him meet his goals. He feels like he is getting in shape. He blood pressue has improved by continues to fluctuate. He continues to have some chest tightness with increased exercise. We have sent Dr. Domenic Polite a note about this and he says for him to continue taking his SL nitroglycerin when this occurrs. Patient made aware. He improved in his exit measurements in grip strength, balance and flexibility. His exit walk test improved by 25%. He plans to join our forever fit maintenance program to continue exercising. CR will f/u for one year.     Expected Outcomes  Patient will continue to attend sessions and complete the program.   Patient will continue to attend sessions and complete the program.   Patient will continue to attend sessions and complete the program.   Patient plans to join our forever fit maintenance program to continue exercising and continue to meet his personal goals.        Core Components/Risk Factors/Patient Goals at Discharge (Final Review):  Goals and Risk Factor Review - 01/22/18 1105      Core Components/Risk Factors/Patient Goals Review   Personal Goals Review  Weight Management/Obesity   Build stamina; get in shape; stay in  shape.    Review  Patient graduated with 36 sessions gaining 2 lbs overall. He did well in the program overall. He admits he still needs to work on his diet. His diet assessment score did not improved but increased by 4. He says he feels better and stronger overall and thinks the program helped him meet his goals. He feels like he is getting in shape. He blood pressue has improved by continues to fluctuate. He continues to have some chest tightness with increased exercise. We have sent Dr. Domenic Polite a note about this and he says for him to continue taking his SL nitroglycerin when this occurrs. Patient made aware. He improved in his exit measurements in grip strength, balance and flexibility. His exit walk test improved by 25%. He plans to join our forever fit maintenance program to continue exercising. CR will f/u for one year.     Expected Outcomes  Patient plans to join our forever fit maintenance program to continue exercising and continue to meet his personal goals.        ITP Comments: ITP Comments    Row Name 10/22/17 1417 10/24/17 1005         ITP Comments  Mr. Pittinger has been in our program 2 times before. He completed both times. He is very eager to get started again.   Patient is new to program. He has completed 2 sessions. Will continue to monitor for progress.          Comments: Patient graduated from [facility] today on 01/20/2018 after completing 36 sessions. He achieved LTG of 30 minutes of aerobic exercise at Max Met level of 5.5. All patients vitals are WNL. Patient has met with dietician. Discharge instruction has been reviewed in detail and patient stated an understanding of material given. Patient  plans to join our forever fit maintenance program to continue exercising. Cardiac Rehab staff will make f/u calls at 1 month, 6 months, and 1 year. Patient had no complaints of any abnormal S/S or pain on their exit visit.

## 2018-02-04 ENCOUNTER — Other Ambulatory Visit: Payer: Self-pay | Admitting: Cardiology

## 2018-02-28 ENCOUNTER — Telehealth: Payer: Self-pay | Admitting: Cardiology

## 2018-02-28 DIAGNOSIS — I10 Essential (primary) hypertension: Secondary | ICD-10-CM

## 2018-02-28 NOTE — Telephone Encounter (Signed)
Patient having issues with elevated BP. Was advised by Cardiac Rehab to contact Cardiologist/tg

## 2018-02-28 NOTE — Telephone Encounter (Signed)
Returned pt call. He stated that for the past 6 weeks when going to cardiac rehab his blood pressure has been a little higher than normal. He states it ranges from 140's - 160's over 70's- 80's. He states that this is the readings before starting cardiac rehab. He was told by Shauna Hugh to call to inform Cardiologist, as he may need a medication adjustment. He is currently taking lisinopril 5 mg daily and metolpolol 25 mg daily. Please advise.

## 2018-03-02 NOTE — Telephone Encounter (Signed)
Please increase Lisinopril to 10 mg daily and check follow-up BMET in one week.

## 2018-03-04 MED ORDER — LISINOPRIL 10 MG PO TABS: 10.0000 mg | ORAL_TABLET | Freq: Every day | ORAL | 3 refills | Status: DC

## 2018-03-04 NOTE — Telephone Encounter (Signed)
Pt made aware to increase lisinopril to 10 mg. New RX sent. Lab oreder placed. He will have lab done in 1 week.

## 2018-03-07 ENCOUNTER — Ambulatory Visit: Admit: 2018-03-07 | Discharge: 2018-03-08 | Payer: MEDICARE

## 2018-03-07 DIAGNOSIS — L639 Alopecia areata, unspecified: Principal | ICD-10-CM

## 2018-03-07 DIAGNOSIS — L638 Other alopecia areata: Secondary | ICD-10-CM | POA: Diagnosis not present

## 2018-03-19 ENCOUNTER — Telehealth: Payer: Self-pay | Admitting: *Deleted

## 2018-03-19 ENCOUNTER — Other Ambulatory Visit (HOSPITAL_COMMUNITY)
Admission: RE | Admit: 2018-03-19 | Discharge: 2018-03-19 | Disposition: A | Discharge status: Home / Self Care | Payer: Medicare Other | Source: Ambulatory Visit | Attending: Cardiology | Admitting: Cardiology

## 2018-03-19 DIAGNOSIS — I10 Essential (primary) hypertension: Secondary | ICD-10-CM | POA: Diagnosis present

## 2018-03-19 LAB — BASIC METABOLIC PANEL
ANION GAP: 8 (ref 5–15)
BUN: 24 mg/dL — ABNORMAL HIGH (ref 8–23)
CALCIUM: 9.1 mg/dL (ref 8.9–10.3)
CO2: 26 mmol/L (ref 22–32)
CREATININE: 1.23 mg/dL (ref 0.61–1.24)
Chloride: 107 mmol/L (ref 98–111)
GFR calc Af Amer: >60 mL/min (ref >60)
GFR, EST NON AFRICAN AMERICAN: 55 mL/min — AB (ref >60)
Glucose, Bld: 104 mg/dL — ABNORMAL HIGH (ref 70–99)
Potassium: 4.3 mmol/L (ref 3.5–5.1)
Sodium: 141 mmol/L (ref 135–145)

## 2018-03-19 NOTE — Telephone Encounter (Signed)
-----   Message from Satira Sark, MD sent at 03/19/2018  3:56 PM EST ----- Results reviewed.  Creatinine 1.23 and potassium 4.3.  This follows recent increase in lisinopril.  Continue with same for now. A copy of this test should be forwarded to Sinda Du, MD.

## 2018-03-19 NOTE — Telephone Encounter (Signed)
Patient informed and copy sent to PCP. 

## 2018-03-26 NOTE — Progress Notes (Signed)
Cardiology Office Note  Date: 03/27/2018   ID: Eric Borer., DOB [birth year], MRN [id number]  PCP: Sinda Du, MD  Primary Cardiologist: Rozann Lesches, MD   Chief Complaint  Patient presents with  . Coronary Artery Disease    History of Present Illness: Eric Labell. is a 77 y.o. male last seen in August.  He is here for a follow-up visit.  Still exercising through the cardiac rehabilitation program.  He does have occasional exertional angina, but no progressive symptoms.  He also still working to get better control of blood pressure.  Since last visit lisinopril has been increased to 10 mg daily in the setting of elevated blood pressure.  Follow-up lab work is outlined below.  Although his blood pressure measurement was better today, we went over his home checks which have not been nearly as well controlled.  Otherwise he reports compliance with his remaining medications, no palpitations or lightheadedness.  Past Medical History:  Diagnosis Date  . Arthritis   . Ascending aortic aneurysm [facility])    Status post repair 12/2011 at [facility]  . BPH (benign prostatic hyperplasia)   . Chronic diastolic CHF (congestive heart failure) ([place])   . Coronary atherosclerosis of native coronary artery    a. Multivessel status post prior stenting and ultimately CABG 12/2011 at [facility] with with LIMA to LAD, SVG to PLB, SVG to ramus, SVG to OM . b. Last cath 06/2014 (following ischemic nuc) -> medical therapy, no feasible way to revascularize LCx territory.  . Essential hypertension   . Mixed hyperlipidemia   . Myocardial infarction ([place])   . Postoperative atrial fibrillation ([place])   . Supraventricular tachycardia [facility])     Past Surgical History:  Procedure Laterality Date  . ASCENDING AORTIC ANEURYSM REPAIR  12/2011 [facility]   26 mm.Dacron graft  . CORONARY ARTERY BYPASS GRAFT  12/2011 [facility]   LIMA to LAD, SVG to PLB, SVG to ramus, SVG to OM  . CORONARY ARTERY BYPASS GRAFT N/A  08/06/2017   Procedure: REDO CORONARY ARTERY BYPASS GRAFTING (CABG) TIMES FOUR UTILIZING LEFT GREATER SAPHENOUS VEIN HAVESTED ENDOVASCULARLY, LEFT RADIAL ARTERY.  RIGHT AXILLARY CANNULATION;  Surgeon: Gaye Pollack, MD;  Location: [place];  Service: Open Heart Surgery;  Laterality: N/A;  . HERNIA REPAIR    . LEFT HEART CATH AND CORS/GRAFTS ANGIOGRAPHY N/A 07/11/2017   Procedure: LEFT HEART CATH AND CORS/GRAFTS ANGIOGRAPHY;  Surgeon: Troy Sine, MD;  Location: [place] CV LAB;  Service: Cardiovascular;  Laterality: N/A;  . LEFT HEART CATHETERIZATION WITH CORONARY ANGIOGRAM N/A 06/18/2014   Procedure: LEFT HEART CATHETERIZATION WITH CORONARY ANGIOGRAM;  Surgeon: Blane Ohara, MD;  Location: [facility] CATH LAB;  Service: Cardiovascular;  Laterality: N/A;  . RADIAL ARTERY HARVEST Left 08/06/2017   Procedure: RADIAL ARTERY HARVEST;  Surgeon: Gaye Pollack, MD;  Location: [place];  Service: Open Heart Surgery;  Laterality: Left;  . TEE WITHOUT CARDIOVERSION N/A 08/06/2017   Procedure: TRANSESOPHAGEAL ECHOCARDIOGRAM (TEE);  Surgeon: Gaye Pollack, MD;  Location: [place];  Service: Open Heart Surgery;  Laterality: N/A;  . TOTAL HIP ARTHROPLASTY Right 02/07/2016   Procedure: RIGHT TOTAL HIP ARTHROPLASTY ANTERIOR APPROACH;  Surgeon: Mcarthur Rossetti, MD;  Location: [place];  Service: Orthopedics;  Laterality: Right;  . TRANSURETHRAL RESECTION OF PROSTATE      Current Outpatient Medications  Medication Sig Dispense Refill  . amiodarone (PACERONE) 200 MG tablet TAKE 1/2 TABLET BY MOUTH DAILY (CUT IN HALF FOR PATIENT)  45 tablet 6  . aspirin EC 81 MG tablet Take 81 mg by mouth daily.    [name] atorvastatin (LIPITOR) 40 MG tablet Take 1 tablet (40 mg total) by mouth at bedtime. 90 tablet 3  . furosemide (LASIX) 20 MG tablet Take 2 tablets (40 mg total) by mouth daily. (Patient taking differently: Take 40 mg by mouth daily as needed for fluid. ) 180 tablet 3  . lisinopril (PRINIVIL,ZESTRIL) 10 MG tablet Take 1  tablet (10 mg total) by mouth every morning. & 5 mg in the evening 90 tablet 3  . metoprolol tartrate (LOPRESSOR) 25 MG tablet TAKE 1 TABLET BY MOUTH TWICE DAILY 60 tablet 6  . Multiple Vitamin (MULTIVITAMIN) tablet Take 1 tablet by mouth at bedtime.     . nitroGLYCERIN (NITROSTAT) 0.4 MG SL tablet PLACE 1 TABLET UNDER TONGUE EVERY 5 MINUTES AS NEEDED FOR CHEST PAIN 25 tablet 3  . Omega-3 Fatty Acids (FISH OIL) 1000 MG CAPS Take 1,000 capsules by mouth daily.    . Tamsulosin HCl (FLOMAX) 0.4 MG CAPS Take 0.4 mg by mouth daily.      No current facility-administered medications for this visit.    Allergies:  Patient has no known allergies.   Social History: The patient  reports that he has never smoked. He has never used smokeless tobacco. He reports that he drinks alcohol. He reports that he does not use drugs.   ROS:  Please see the history of present illness. Otherwise, complete review of systems is positive for none.  All other systems are reviewed and negative.   Physical Exam: VS:  BP 132/64   Pulse (!) 58   Ht 5\' 7"  (1.702 m)   Wt 197 lb (89.4 kg)   SpO2 97%   BMI 30.85 kg/m , BMI Body mass index is 30.85 kg/m.  Wt Readings from Last 3 Encounters:  03/27/18 197 lb (89.4 kg)  01/22/18 191 lb 2.2 oz (86.7 kg)  12/25/17 195 lb (88.5 kg)    General: Patient appears comfortable at rest. HEENT: Conjunctiva and lids normal, oropharynx clear. Neck: Supple, no elevated JVP or carotid bruits, no thyromegaly. Lungs: Clear to auscultation, nonlabored breathing at rest. Cardiac: Regular rate and rhythm, no S3, 2/6 systolic murmur. Abdomen: Soft, nontender, bowel sounds present. Extremities: Trace ankle edema, distal pulses 2+. Skin: Warm and dry. Musculoskeletal: No kyphosis. Neuropsychiatric: Alert and oriented x3, affect grossly appropriate.  ECG: I personally reviewed the tracing from 08/30/2017 which showed sinus rhythm with prolonged PR interval and nonspecific T wave  changes.  Recent Labwork: 08/05/2017: ALT 20; AST 21 08/07/2017: Magnesium 2.4 08/09/2017: Hemoglobin 8.4; Platelets 138 03/19/2018: BUN 24; Creatinine, Ser 1.23; Potassium 4.3; Sodium 141     Component Value Date/Time   CHOL 130 03/06/2017 0610   TRIG 262 (H) 03/06/2017 0610   HDL 28 (L) 03/06/2017 0610   CHOLHDL 4.6 03/06/2017 0610   VLDL 52 (H) 03/06/2017 0610   LDLCALC 50 03/06/2017 0610    Other Studies Reviewed Today:  Cardiac catheterization 07/11/2017:  Prox RCA to Mid RCA lesion is 30% stenosed.  Mid RCA lesion is 20% stenosed.  Post Atrio lesion is 95% stenosed.  Ost Cx to Prox Cx lesion is 100% stenosed.  Ost 1st Diag lesion is 90% stenosed.  Prox LAD-1 lesion is 80% stenosed.  Prox LAD-2 lesion is 70% stenosed.  The left ventricular ejection fraction is 50-55% by visual estimate.  The left ventricular systolic function is normal.  LV end diastolic pressure  is mildly elevated.  Normal global LV function with ejection fraction at 50-55%.  Severe native CAD with 80% proximal calcified LAD stenosis in the region of the first diagonal vessel with 90% ostial first diagonal stenosis and 60-70% diffuse LAD stenosis after the second diagonal vessel with competitive filling beyond this from the LIMA graft; total ostial occlusion of the left circumflex coronary artery at site of prior stenting. There is collateralization to 2 branches of the circumflex via the left injection. Large, very dominant RCA with diffuse calcification with 30% narrowing in the mid segment, a patent stent proximal to the acute margin with 20% intimal aplasia, and apparent significant calcific rocklike segment in the region just beyond the very large PDA takeoff and prior to to large inferior LV and posterior lateral branches.  Occlusion of the previous 3 saphenous vein conduits at its origin in the aorta.  Patent LIMA graft supplying the mid LAD.  RECOMMENDATION: Angiographic findings were  reviewed with Dr. Burt Knack. It appears that the 90% very calcified "rock "in the dista RCA jeopardizes a very large distal RCA system. This may not be amenable to atherectomy and and would be very difficult to stent which would also potentially jail these large branch vessels. With his significant concomitant disease with total occlusion of the circumflex and collateralized large branches that are good surgical targets and in addition to his proximal LAD and diagonal vessel stenosis outpatient surgical consultation will be initially obtained for consideration of redo CABG revascularization. Isosorbide will be added to his medical regimen.  TEE 08/06/2017: Left Ventricle Normal cavity size and wall thickness. LV systolic function is low normal with an EF of 50-55%. There are no obvious wall motion abnormalities. No thrombus present. No mass present.  Septum Normal atrial and ventricular septum with no septal defect and normal septal motion. No Patent Foramen Ovale present.  Left Atrium Left atrium normal in structure and function. No thrombus present. No left atrial appendage. No mass present. No ASD or PFO closure device in interatrial septum.  Aortic Valve The valve is trileaflet. Normal leaflet separation. No stenosis. Mild to moderate regurgitation. No AV vegetation.  Aorta No aneurysm present. Graft present in the ascending aorta. No aortic dissection present  Mitral Valve Normal valve structure. No leaflet thickening and calcification present. No stenosis. Normal leaflet mobility. Trace regurgitation. The jet direction is centrally-directed. No prolapse present. No vegetation present. No abscess present. No flail present.  Right Ventricle Normal cavity size, wall thickness and ejection fraction.  Right Atrium Normal right atrial size.  Tricuspid Valve Normal tricuspid valve structure. No stenosis. No regurgitation.  Pulmonic Valve Normal pulmonic valve structure. No prosthetic valve present. Trace  regurgitation.   Assessment and Plan:  1.  Multivessel CAD status post redo CABG in March of this year.  Plan is to continue exercise regimen.  We will continue with medical therapy, advancing lisinopril for better blood pressure control.  He remains on aspirin and statin.  2.  Essential hypertension, blood pressure not optimally controlled as yet.  He will try to increase lisinopril to 10 mg in the morning and 5 mg in the evening for the next week, call back to report blood pressure checks at home.  May need to increase to 10 mg twice daily.  Either way we will plan to follow-up BME T in 1 week after dose increase.  3.  Postoperative atrial fibrillation.  No palpitations or obvious recurrences.  He remains on low-dose amiodarone, has history of PSVT  as well.  Current medicines were reviewed with the patient today.   Orders Placed This Encounter  Procedures  . Basic metabolic panel    Disposition: Follow-up in 4 months.  Signed, Satira Sark, MD, [facility] 03/27/2018 3:54 PM    [facility] [place] [facility] [place], [place], [place] [address] Phone: [phone]; Fax: [phone]

## 2018-03-27 ENCOUNTER — Ambulatory Visit (INDEPENDENT_AMBULATORY_CARE_PROVIDER_SITE_OTHER): Payer: Medicare Other | Admitting: Cardiology

## 2018-03-27 ENCOUNTER — Encounter: Payer: Self-pay | Admitting: Cardiology

## 2018-03-27 VITALS — BP 132/64 | HR 58 | Ht 67.0 in | Wt 197.0 lb

## 2018-03-27 DIAGNOSIS — I9789 Other postprocedural complications and disorders of the circulatory system, not elsewhere classified: Secondary | ICD-10-CM

## 2018-03-27 DIAGNOSIS — I4891 Unspecified atrial fibrillation: Secondary | ICD-10-CM

## 2018-03-27 DIAGNOSIS — I471 Supraventricular tachycardia: Secondary | ICD-10-CM | POA: Diagnosis not present

## 2018-03-27 DIAGNOSIS — I10 Essential (primary) hypertension: Secondary | ICD-10-CM

## 2018-03-27 DIAGNOSIS — I25709 Atherosclerosis of coronary artery bypass graft(s), unspecified, with unspecified angina pectoris: Secondary | ICD-10-CM

## 2018-03-27 MED ORDER — LISINOPRIL 10 MG PO TABS: 10.0000 mg | ORAL_TABLET | ORAL | 3 refills | Status: DC

## 2018-03-27 NOTE — Patient Instructions (Addendum)
Medication Instructions:   Your physician has recommended you make the following change in your medication:   Increase lisinopril to 10 mg in the morning and 5 mg in the evening for one week. Call our office with an update of your blood pressure readings in one week.  Continue all other medications the same.  Labwork:  Your physician recommends that you return for lab work in: 7-10 days to check your BMET.  Testing/Procedures:  NONE  Follow-Up:  Your physician recommends that you schedule a follow-up appointment in: 4 months  Any Other Special Instructions Will Be Listed Below (If Applicable).  If you need a refill on your cardiac medications before your next appointment, please call your pharmacy.

## 2018-04-14 ENCOUNTER — Other Ambulatory Visit (INDEPENDENT_AMBULATORY_CARE_PROVIDER_SITE_OTHER): Payer: Self-pay

## 2018-04-14 ENCOUNTER — Ambulatory Visit (INDEPENDENT_AMBULATORY_CARE_PROVIDER_SITE_OTHER): Payer: Self-pay

## 2018-04-14 ENCOUNTER — Ambulatory Visit (INDEPENDENT_AMBULATORY_CARE_PROVIDER_SITE_OTHER): Payer: Medicare Other | Admitting: Orthopaedic Surgery

## 2018-04-14 ENCOUNTER — Encounter (INDEPENDENT_AMBULATORY_CARE_PROVIDER_SITE_OTHER): Payer: Self-pay | Admitting: Orthopaedic Surgery

## 2018-04-14 DIAGNOSIS — M5431 Sciatica, right side: Secondary | ICD-10-CM | POA: Diagnosis not present

## 2018-04-14 DIAGNOSIS — M4807 Spinal stenosis, lumbosacral region: Secondary | ICD-10-CM

## 2018-04-14 DIAGNOSIS — Z96641 Presence of right artificial hip joint: Secondary | ICD-10-CM | POA: Diagnosis not present

## 2018-04-14 MED ORDER — METHYLPREDNISOLONE 4 MG PO TABS: ORAL_TABLET | ORAL | 0 refills | Status: DC

## 2018-04-14 NOTE — Progress Notes (Signed)
Office Visit Note   Patient: Eric Beltran.           Date of Birth: 10-18-1940           MRN: [id number] Visit Date: 04/14/2018              Requested by: Sinda Du, MD [address] [place], [place] [address] PCP: Sinda Du, MD   Assessment & Plan: Visit Diagnoses:  1. History of right hip replacement   2. Sciatica, right side     Plan: Since he is not a diabetic, with a put him on 6 days of a steroid taper with a Medrol Dosepak.  We will set him up for an MRI to assess his lumbar spine for nerve compression based on his symptomology.  This will help determine the next intervention.  All question concerns were answered and addressed.  We will see him back in 2 weeks to go over MRI.  Follow-Up Instructions: Return in about 2 weeks (around 04/28/2018).   Orders:  Orders Placed This Encounter  Procedures  . XR HIP UNILAT W OR W/O PELVIS 1V RIGHT  . XR Lumbar Spine 2-3 Views   Meds ordered this encounter  Medications  . methylPREDNISolone (MEDROL) 4 MG tablet    Sig: Medrol dose pack. Take as instructed    Dispense:  21 tablet    Refill:  0      Procedures: No procedures performed   Clinical Data: No additional findings.   Subjective: Chief Complaint  Patient presents with  . Right Hip - Follow-up  The patient comes in today with a 5-week history of right-sided sciatic and atrial pain.  He is 2 years out from a right total hip arthroplasty.  He is not a diabetic.  He feels like the leg is gotten weaker with time.  It does radiate down to his foot.  He does get some numbness and tingling as well.  He has no groin pain.  He denies any change in bowel bladder function.  HPI  Review of Systems He currently denies any headache, chest pain,  shortness of breath, fever, chills, nausea, vomiting.  Objective: Vital Signs: There were no vitals taken for this visit.  Physical Exam He is alert and orient x3 and in no acute distress Ortho Exam Examination of his right hip shows no pain in the groin with full internal X rotation.  He does have a positive straight leg raise to the right side.  He has subjective numbness in the L5 distribution.  He has some subjective numbness in the lateral aspect of his right leg as well.  There is slight weakness in his calfs on the right side comparing the right and left.  He has pain with stretch of the sciatic nerve.  There is pain to palpation of the posterior pelvis and issue him. Specialty  Comments:  No specialty comments available.  Imaging: Xr Hip Unilat W Or W/o Pelvis 1v Right  Result Date: 04/14/2018 An AP pelvis shows a total hip arthroplasty on the right side with no complicating features.  The lateral view also shows a well-seated implant that appears bone ingrown with no evidence of loosening.  Xr Lumbar Spine 2-3 Views  Result Date: 04/14/2018 2 views of the lumbar spine shows significant spondylosis and spondylitis at several levels.  There is a spondylolisthesis at L4-L5 which is grade 1.  There is significant degenerative disc disease between L2-L3 and L5-S1.    PMFS History: Patient Active Problem List   Diagnosis Date Noted  . S/P CABG x 4 08/06/2017  . Coronary artery disease involving coronary bypass graft of native heart with angina pectoris ([place])   . BPH with obstruction/lower urinary tract symptoms 03/06/2017  . Angina pectoris ([place]) 03/05/2017  . Osteoarthritis of right hip 02/07/2016  . Status post total replacement of right hip 02/07/2016  . Abnormal cardiovascular function study   . Abnormal myocardial perfusion study 06/09/2014  . Cardiac murmur 05/21/2014  . Aneurysm of thoracic aorta ([place]) 04/13/2010  . Mixed hyperlipidemia 07/20/2009  . Essential  hypertension, benign 07/20/2009  . Coronary atherosclerosis of native coronary artery 07/20/2009   Past Medical History:  Diagnosis Date  . Arthritis   . Ascending aortic aneurysm [facility])    Status post repair 12/2011 at [facility]  . BPH (benign prostatic hyperplasia)   . Chronic diastolic CHF (congestive heart failure) ([place])   . Coronary atherosclerosis of native coronary artery    a. Multivessel status post prior stenting and ultimately CABG 12/2011 at [facility] with with LIMA to LAD, SVG to PLB, SVG to ramus, SVG to OM . b. Last cath 06/2014 (following ischemic nuc) -> medical therapy, no feasible way to revascularize LCx territory.  . Essential hypertension   . Mixed hyperlipidemia   . Myocardial infarction ([place])   . Postoperative atrial fibrillation ([place])   . Supraventricular tachycardia ([facility])     Family History  Problem Relation Age of Onset  . Hypertension Father   . Parkinson's disease Father   . Hypertension Mother   . Lung cancer Sister     Past Surgical History:  Procedure Laterality Date  . ASCENDING AORTIC ANEURYSM REPAIR  12/2011 [facility]   26 mm.Dacron graft  . CORONARY ARTERY BYPASS GRAFT  12/2011 [facility]   LIMA to LAD, SVG to PLB, SVG to ramus, SVG to OM  . CORONARY ARTERY BYPASS GRAFT N/A 08/06/2017   Procedure: REDO CORONARY ARTERY BYPASS GRAFTING (CABG) TIMES FOUR UTILIZING LEFT GREATER SAPHENOUS VEIN HAVESTED ENDOVASCULARLY, LEFT RADIAL ARTERY.  RIGHT AXILLARY CANNULATION;  Surgeon: Gaye Pollack, MD;  Location: [place];  Service: Open Heart Surgery;  Laterality: N/A;  . HERNIA REPAIR    . LEFT HEART CATH AND CORS/GRAFTS ANGIOGRAPHY N/A 07/11/2017   Procedure: LEFT HEART CATH AND CORS/GRAFTS ANGIOGRAPHY;  Surgeon: Troy Sine, MD;  Location: [place] CV LAB;  Service: Cardiovascular;  Laterality: N/A;  . LEFT HEART CATHETERIZATION WITH CORONARY ANGIOGRAM N/A 06/18/2014   Procedure: LEFT HEART CATHETERIZATION WITH CORONARY ANGIOGRAM;  Surgeon: Blane Ohara, MD;   Location: [facility] CATH LAB;  Service: Cardiovascular;  Laterality: N/A;  . RADIAL ARTERY HARVEST Left 08/06/2017   Procedure: RADIAL ARTERY HARVEST;  Surgeon: Gaye Pollack, MD;  Location: [place];  Service: Open Heart Surgery;  Laterality: Left;  . TEE WITHOUT CARDIOVERSION N/A 08/06/2017   Procedure: TRANSESOPHAGEAL  ECHOCARDIOGRAM (TEE);  Surgeon: Gaye Pollack, MD;  Location: [place];  Service: Open Heart Surgery;  Laterality: N/A;  . TOTAL HIP ARTHROPLASTY Right 02/07/2016   Procedure: RIGHT TOTAL HIP ARTHROPLASTY ANTERIOR APPROACH;  Surgeon: Mcarthur Rossetti, MD;  Location: [place];  Service: Orthopedics;  Laterality: Right;  . TRANSURETHRAL RESECTION OF PROSTATE     Social History   Occupational History  . Not on file  Tobacco Use  . Smoking status: Never Smoker  . Smokeless tobacco: Never Used  . Tobacco comment: tobacco use - no  Substance and Sexual Activity  . Alcohol use: Yes    Alcohol/week: 0.0 standard drinks    Comment: Occasional  . Drug use: No  . Sexual activity: Not on file

## 2018-04-26 ENCOUNTER — Other Ambulatory Visit: Payer: Medicare Other

## 2018-04-28 ENCOUNTER — Ambulatory Visit (INDEPENDENT_AMBULATORY_CARE_PROVIDER_SITE_OTHER): Payer: Medicare Other | Admitting: Orthopaedic Surgery

## 2018-05-03 IMAGING — CT CT ANGIO CHEST
2 of 6 series · 13 of 36 positions shown · IV contrast (iopamidol)
Comparison: CTA chest dated April 14, 2010.

CLINICAL DATA: History of ascending thoracic aortic aneurysm repair
in 3900. Patient is currently asymptomatic.

EXAM:
CT ANGIOGRAPHY CHEST WITH CONTRAST
TECHNIQUE: Multidetector CT imaging of the chest was performed using the
standard protocol during bolus administration of intravenous
contrast. Multiplanar CT image reconstructions and MIPs were
obtained to evaluate the vascular anatomy.
CONTRAST:  75mL [id number] IOPAMIDOL ([id number]) INJECTION 76%

[Series 5: cta thorax 2.00 bv36 s3 ax axial arterial · axial · arterial · 0.65mm/px · z∈[+1519,+1791]mm · 12 of 162 slices shown]
[im 13/162  lung]
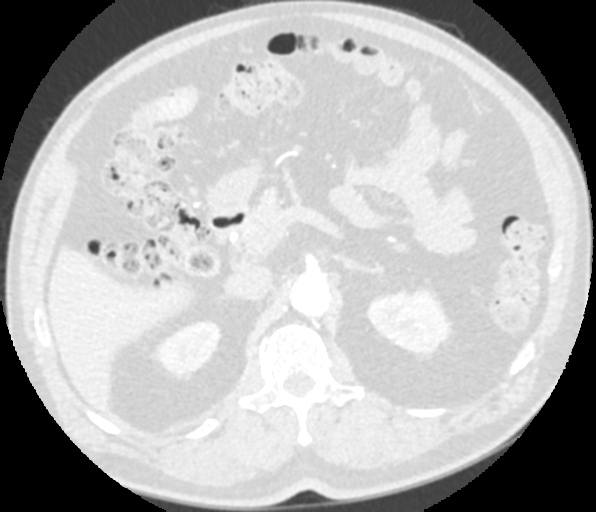
[im 25/162  mediastinal]
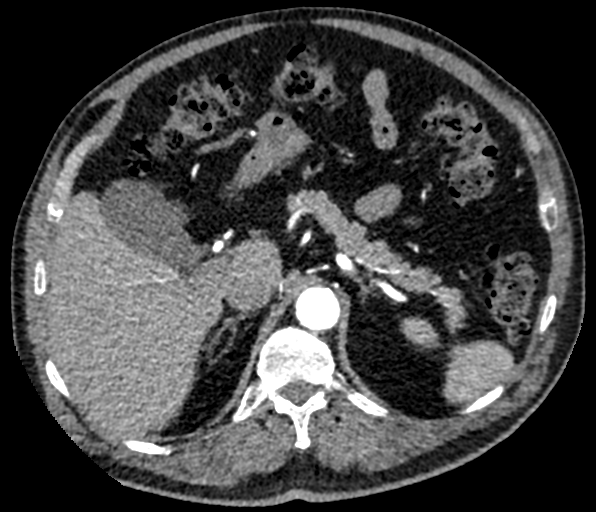
[im 38/162  lung]
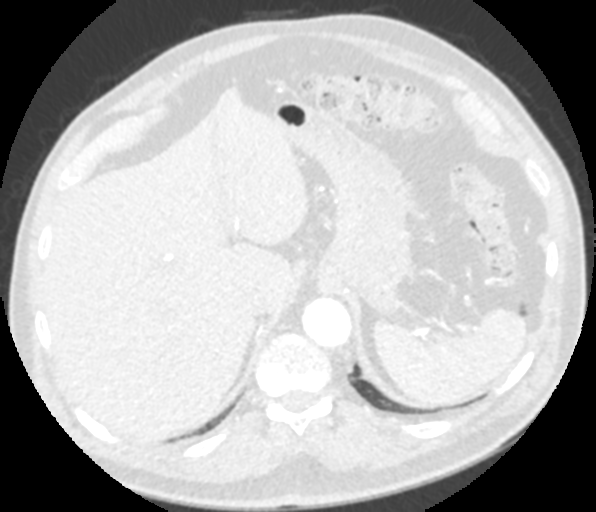
[im 50/162  mediastinal]
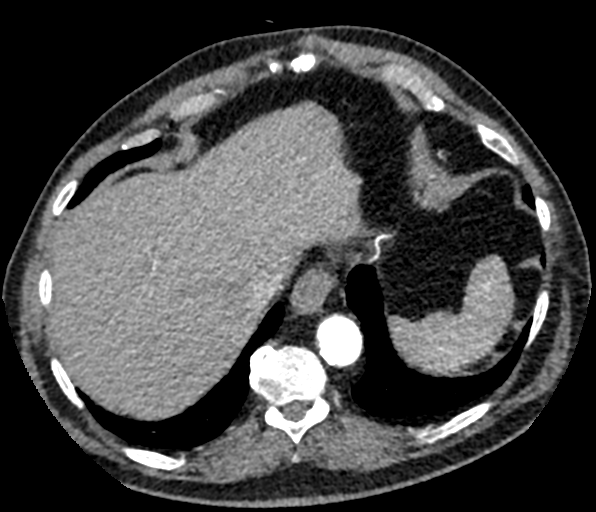
[im 62/162  lung]
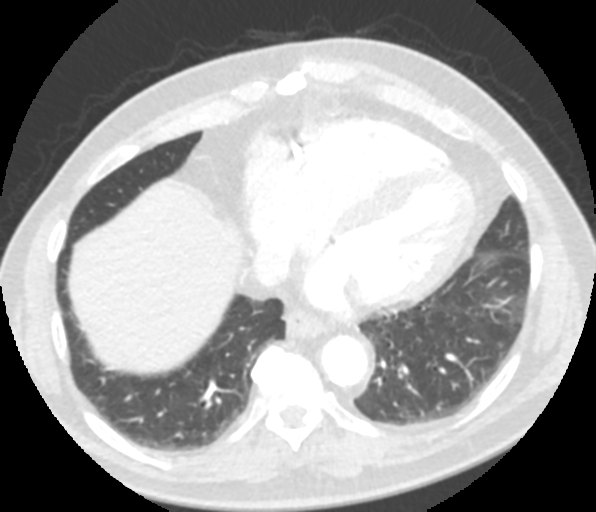
[im 75/162  mediastinal]
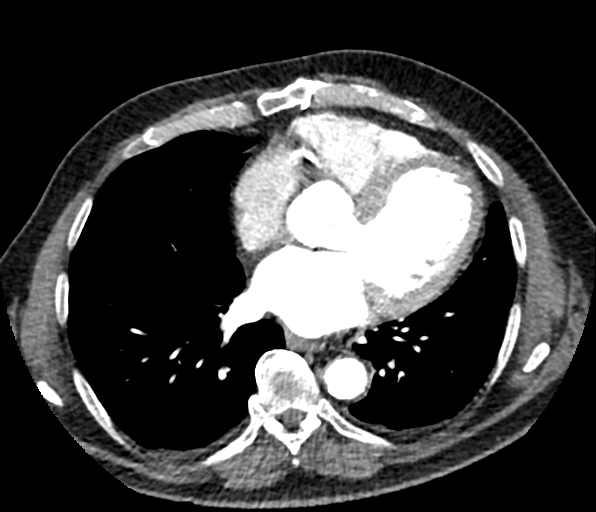
[im 87/162  lung]
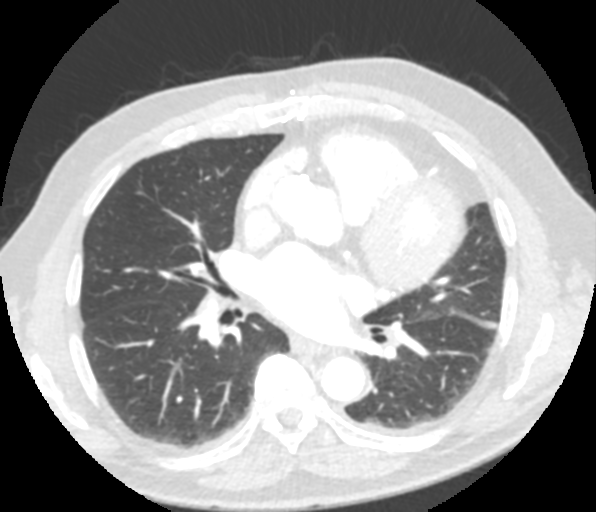
[im 100/162  mediastinal]
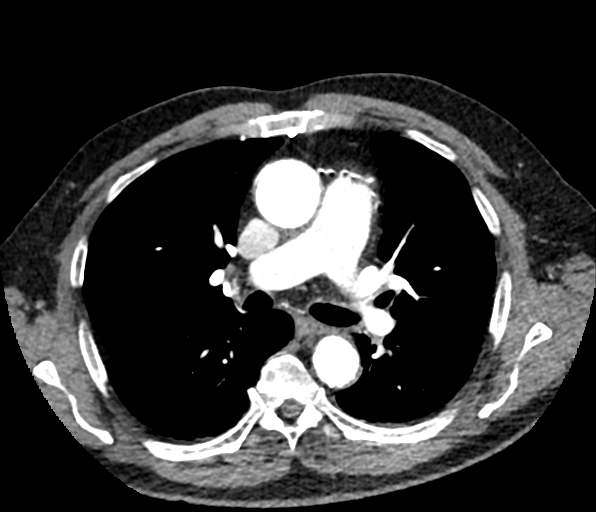
[im 112/162  lung]
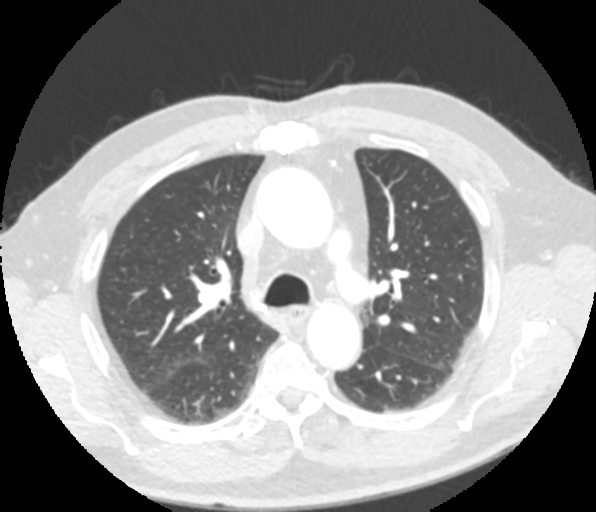
[im 124/162  mediastinal]
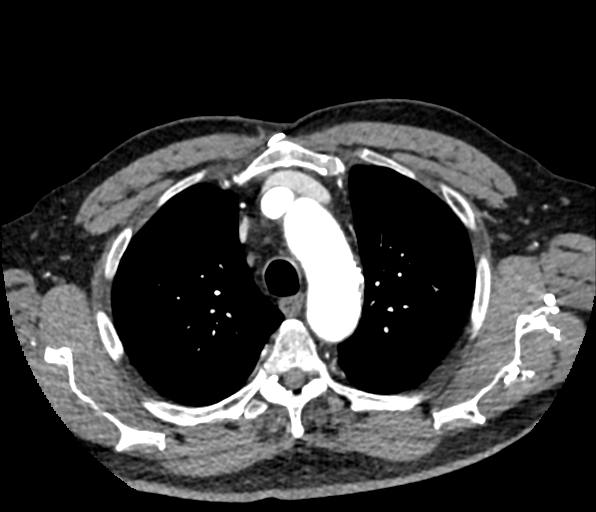
[im 137/162  lung]
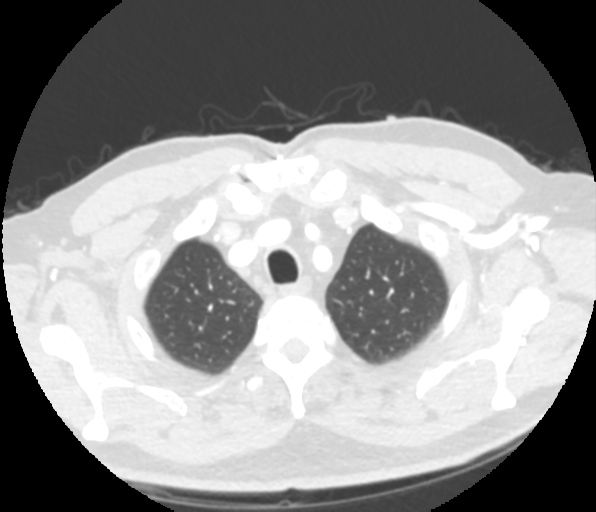
[im 149/162  mediastinal]
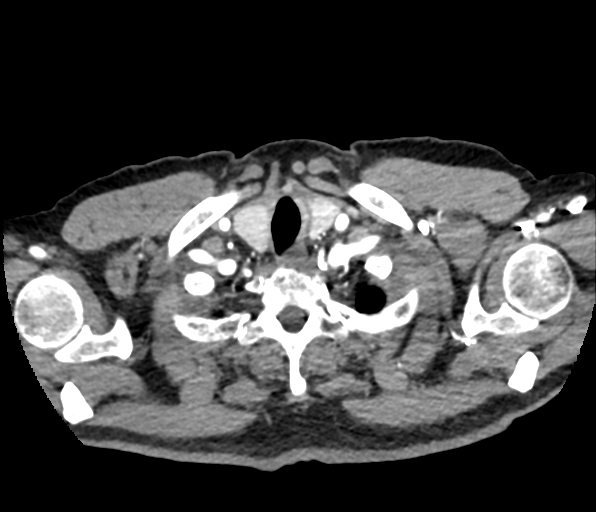

[Series 11: cta thorax 2.00 bv36 s3 cor cor st · coronal · 0.63mm/px · 1 of 166 slices shown]
[im 83/166  mediastinal]
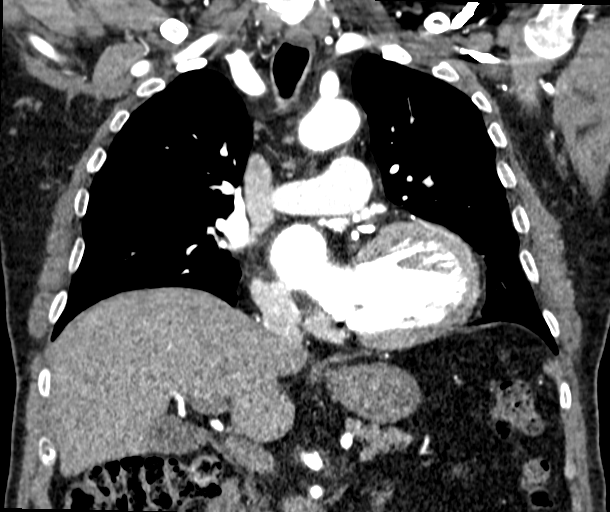

[13 of 36 positions shown; findings below may reference images not displayed]

FINDINGS: Cardiovascular: Prior ascending thoracic aortic aneurysm repair.
Unchanged aneurysmal dilatation of the ascending thoracic aorta
distal to the prior aneurysm repair, measuring up to 4.4 cm. Normal
heart size. No pericardial effusion. Coronary, aortic arch, and
branch vessel atherosclerotic vascular disease. No central pulmonary
embolism.

Mediastinum/Nodes: No enlarged mediastinal, hilar, or axillary lymph
nodes. Bilateral thyroid nodules are unchanged. The trachea and
esophagus demonstrate no significant findings.

Lungs/Pleura: Minimal bibasilar atelectasis. Scarring in the
lingula. No focal consolidation, pleural effusion, or pneumothorax.
No suspicious pulmonary nodule.

Upper Abdomen: Small hiatal hernia.  No acute abnormality.

Musculoskeletal: Interval increase in size of a now 10 mm sclerotic
lesion in the left T5 pedicle, previously 3 mm. Prior healed median
sternotomy. No chest wall abnormality. No acute or significant
osseous findings.

Review of the MIP images confirms the above findings.
IMPRESSION: 1. Prior ascending thoracic aortic aneurysm repair. Unchanged
aneurysmal dilatation of the ascending thoracic aorta distal to the
aneurysm repair, measuring up to 4.4 cm. Recommend annual imaging
followup by CTA or MRA. This recommendation follows 9262
ACCF/AHA/AATS/ACR/ASA/SCA/RODOLFO/KEMPU/NAZAKU/AUNTYJATTY Guidelines for the
Diagnosis and Management of Patients with Thoracic Aortic Disease.
Circulation. 9262; 121: e266-e369
2. Interval increase in size of a now 10 mm sclerotic lesion in the
left T5 pedicle, previously 3 mm. This is nonspecific and may
represent a bone island given the slow growth over past 8 years.
Consider bone scan to evaluate for abnormal metabolic activity or
other lesions.
3.  Aortic atherosclerosis ([id number]-872.2).

## 2018-05-12 ENCOUNTER — Ambulatory Visit
Admission: RE | Admit: 2018-05-12 | Discharge: 2018-05-12 | Disposition: A | Discharge status: Home / Self Care | Payer: Medicare Other | Source: Ambulatory Visit | Attending: Orthopaedic Surgery | Admitting: Orthopaedic Surgery

## 2018-05-12 DIAGNOSIS — M48061 Spinal stenosis, lumbar region without neurogenic claudication: Secondary | ICD-10-CM | POA: Diagnosis not present

## 2018-05-12 DIAGNOSIS — M4807 Spinal stenosis, lumbosacral region: Secondary | ICD-10-CM

## 2018-05-16 ENCOUNTER — Telehealth: Payer: Self-pay | Admitting: Cardiology

## 2018-05-16 ENCOUNTER — Other Ambulatory Visit (HOSPITAL_COMMUNITY)
Admission: RE | Admit: 2018-05-16 | Discharge: 2018-05-16 | Disposition: A | Discharge status: Home / Self Care | Payer: Medicare Other | Source: Ambulatory Visit | Attending: Cardiology | Admitting: Cardiology

## 2018-05-16 DIAGNOSIS — I10 Essential (primary) hypertension: Secondary | ICD-10-CM | POA: Diagnosis not present

## 2018-05-16 DIAGNOSIS — I25709 Atherosclerosis of coronary artery bypass graft(s), unspecified, with unspecified angina pectoris: Secondary | ICD-10-CM | POA: Insufficient documentation

## 2018-05-16 LAB — BASIC METABOLIC PANEL
Anion gap: 10 (ref 5–15)
BUN: 24 mg/dL — ABNORMAL HIGH (ref 8–23)
CO2: 23 mmol/L (ref 22–32)
Calcium: 8.7 mg/dL — ABNORMAL LOW (ref 8.9–10.3)
Chloride: 105 mmol/L (ref 98–111)
Creatinine, Ser: 1.35 mg/dL — ABNORMAL HIGH (ref 0.61–1.24)
GFR calc Af Amer: 59 mL/min — ABNORMAL LOW (ref >60)
GFR calc non Af Amer: 51 mL/min — ABNORMAL LOW (ref >60)
GLUCOSE: 98 mg/dL (ref 70–99)
Potassium: 4.1 mmol/L (ref 3.5–5.1)
Sodium: 138 mmol/L (ref 135–145)

## 2018-05-16 NOTE — Telephone Encounter (Signed)
Pt came into office after he had his labs drawn requested we send an order for them to check his Cholesterol.

## 2018-05-16 NOTE — Telephone Encounter (Signed)
Patient says he last ate around 11:00 am and did lab work 15 minutes ago. Patient informed that a cholesterol level would need to checked after fasting at least 8 hours and we wouldn't be able to add on this test. Verbalized understanding.

## 2018-05-20 ENCOUNTER — Telehealth: Payer: Self-pay | Admitting: *Deleted

## 2018-05-20 NOTE — Telephone Encounter (Signed)
-----   Message from Satira Sark, MD sent at 05/18/2018 10:23 AM EST ----- Results reviewed. Creatinine has crept up some from 1.23 to 1.35, potassium normal. Would keep ACE-I at current dose and follow blood pressure. A copy of this test should be forwarded to Sinda Du, MD.

## 2018-05-20 NOTE — Telephone Encounter (Signed)
Patient informed and copy sent to PCP. 

## 2018-06-09 MED ORDER — TAMSULOSIN 0.4 MG CAPSULE
ORAL_CAPSULE | 1 refills | 0 days | Status: SS
Start: 2018-06-09 — End: 2018-12-01

## 2018-06-11 ENCOUNTER — Encounter (INDEPENDENT_AMBULATORY_CARE_PROVIDER_SITE_OTHER): Payer: Self-pay | Admitting: Orthopaedic Surgery

## 2018-06-11 ENCOUNTER — Ambulatory Visit (INDEPENDENT_AMBULATORY_CARE_PROVIDER_SITE_OTHER): Payer: Medicare Other | Admitting: Orthopaedic Surgery

## 2018-06-11 DIAGNOSIS — M4807 Spinal stenosis, lumbosacral region: Secondary | ICD-10-CM | POA: Diagnosis not present

## 2018-06-11 NOTE — Progress Notes (Signed)
The patient comes in today to go over an MRI of his lumbar spine.  When I saw him the other week he was having debilitating back pain with radicular symptoms going down both his legs.  Since being on the steroid and Neurontin he is doing much better overall.  He does report that he had a history of steroid injections in his lumbar spine in [facility] at one point that helped significantly.  We did send her for an MRI to determine if he is got significant compression of nerves in his back.  He is feeling much better today.  On exam he does have better mobility getting out of a chair.  He is moving his legs easily.  He is got good strength in his bilateral lower extremities with some subjective numbness on both legs on the lateral aspect.  MRI does show multifactorial stenosis which is quite severe at L2-L3 but also at lower levels.  Both foramina are showing significant impingement at those levels as well.  There is a lot of issues affecting the right side even at L3 and L5.  At this standpoint he is doing better he does not need any other intervention.  He would like to consider consultation with Dr. Ernestina Patches if he gets to the point where he like to try injections again.  I gave him our card with Dr. Romona Curls name on it and told him that if he decided he needed intervention his lumbar spine to give a call so a consultation can be set up with Dr. Ernestina Patches.  Follow-up will be as needed otherwise.  All question concerns were answered and addressed.

## 2018-06-25 ENCOUNTER — Emergency Department (HOSPITAL_COMMUNITY)
Admission: EM | Admit: 2018-06-25 | Discharge: 2018-06-25 | Disposition: A | Discharge status: Home / Self Care | Payer: Medicare Other | Attending: Emergency Medicine | Admitting: Emergency Medicine

## 2018-06-25 ENCOUNTER — Encounter (HOSPITAL_COMMUNITY): Payer: Self-pay | Admitting: Emergency Medicine

## 2018-06-25 ENCOUNTER — Emergency Department (HOSPITAL_COMMUNITY): Payer: Medicare Other

## 2018-06-25 ENCOUNTER — Other Ambulatory Visit: Payer: Self-pay

## 2018-06-25 DIAGNOSIS — E876 Hypokalemia: Secondary | ICD-10-CM

## 2018-06-25 DIAGNOSIS — I2510 Atherosclerotic heart disease of native coronary artery without angina pectoris: Secondary | ICD-10-CM | POA: Diagnosis not present

## 2018-06-25 DIAGNOSIS — Z79899 Other long term (current) drug therapy: Secondary | ICD-10-CM | POA: Insufficient documentation

## 2018-06-25 DIAGNOSIS — Z96641 Presence of right artificial hip joint: Secondary | ICD-10-CM | POA: Diagnosis not present

## 2018-06-25 DIAGNOSIS — I252 Old myocardial infarction: Secondary | ICD-10-CM | POA: Diagnosis not present

## 2018-06-25 DIAGNOSIS — I471 Supraventricular tachycardia: Secondary | ICD-10-CM | POA: Diagnosis not present

## 2018-06-25 DIAGNOSIS — I5032 Chronic diastolic (congestive) heart failure: Secondary | ICD-10-CM | POA: Diagnosis not present

## 2018-06-25 DIAGNOSIS — R079 Chest pain, unspecified: Secondary | ICD-10-CM | POA: Diagnosis not present

## 2018-06-25 DIAGNOSIS — I110 Hypertensive heart disease with heart failure: Secondary | ICD-10-CM | POA: Insufficient documentation

## 2018-06-25 DIAGNOSIS — Z951 Presence of aortocoronary bypass graft: Secondary | ICD-10-CM | POA: Diagnosis not present

## 2018-06-25 DIAGNOSIS — Z7982 Long term (current) use of aspirin: Secondary | ICD-10-CM | POA: Insufficient documentation

## 2018-06-25 DIAGNOSIS — R000 Tachycardia, unspecified: Secondary | ICD-10-CM | POA: Diagnosis present

## 2018-06-25 LAB — CBC
HEMATOCRIT: 45.5 % (ref 39.0–52.0)
Hemoglobin: 14.1 g/dL (ref 13.0–17.0)
MCH: 25.7 pg — ABNORMAL LOW (ref 26.0–34.0)
MCHC: 31 g/dL (ref 30.0–36.0)
MCV: 82.9 fL (ref 80.0–100.0)
Platelets: 235 10*3/uL (ref 150–400)
RBC: 5.49 MIL/uL (ref 4.22–5.81)
RDW: 19.1 % — ABNORMAL HIGH (ref 11.5–15.5)
WBC: 9.9 10*3/uL (ref 4.0–10.5)
nRBC: 0 % (ref 0.0–0.2)

## 2018-06-25 LAB — BASIC METABOLIC PANEL
Anion gap: 12 (ref 5–15)
BUN: 20 mg/dL (ref 8–23)
CO2: 20 mmol/L — ABNORMAL LOW (ref 22–32)
Calcium: 8.9 mg/dL (ref 8.9–10.3)
Chloride: 106 mmol/L (ref 98–111)
Creatinine, Ser: 1.03 mg/dL (ref 0.61–1.24)
GFR calc Af Amer: >60 mL/min (ref >60)
GFR calc non Af Amer: >60 mL/min (ref >60)
Glucose, Bld: 145 mg/dL — ABNORMAL HIGH (ref 70–99)
Potassium: 3.3 mmol/L — ABNORMAL LOW (ref 3.5–5.1)
Sodium: 138 mmol/L (ref 135–145)

## 2018-06-25 LAB — TROPONIN I: Troponin I: <0.03 ng/mL (ref <0.03)

## 2018-06-25 LAB — MAGNESIUM: Magnesium: 2 mg/dL (ref 1.7–2.4)

## 2018-06-25 MED ORDER — POTASSIUM CHLORIDE 10 MEQ/100ML IV SOLN
10.0000 meq | Freq: Once | INTRAVENOUS | Status: AC
  Administered 2018-06-25: 10 meq via INTRAVENOUS
  Filled 2018-06-25: qty 100

## 2018-06-25 MED ORDER — ADENOSINE 6 MG/2ML IV SOLN
6.0000 mg | Freq: Once | INTRAVENOUS | Status: DC
  Filled 2018-06-25: qty 2

## 2018-06-25 MED ORDER — POTASSIUM CHLORIDE CRYS ER 20 MEQ PO TBCR: 20.0000 meq | EXTENDED_RELEASE_TABLET | Freq: Two times a day (BID) | ORAL | 0 refills | Status: DC

## 2018-06-25 NOTE — ED Provider Notes (Signed)
[facility] EMERGENCY DEPARTMENT Provider Note   CSN: [id number] Arrival date & time: 06/25/18  1332     History   Chief Complaint Chief Complaint  Patient presents with  . Tachycardia    HPI Eric Haak. is a 78 y.o. male.  HPI Patient presents with fast heart rate.  Was at cardiac rehab again feels heart racing.  Had chest tightness with it.  Heart rate of 130 there and sent to the ER for further evaluation.  Previous history of atrial fibrillation and SVT.  Previous MIs and has had bypasses also. Past Medical History:  Diagnosis Date  . Arthritis   . Ascending aortic aneurysm [facility])    Status post repair 12/2011 at [facility]  . BPH (benign prostatic hyperplasia)   . Chronic diastolic CHF (congestive heart failure) ([place])   . Coronary atherosclerosis of native coronary artery    a. Multivessel status post prior stenting and ultimately CABG 12/2011 at [facility] with with LIMA to LAD, SVG to PLB, SVG to ramus, SVG to OM . b. Last cath 06/2014 (following ischemic nuc) -> medical therapy, no feasible way to revascularize LCx territory.  . Essential hypertension   . Mixed hyperlipidemia   . Myocardial infarction ([place])   . Postoperative atrial fibrillation ([place])   . Supraventricular tachycardia [facility])     Patient Active Problem List   Diagnosis Date Noted  . S/P CABG x 4 08/06/2017  . Coronary artery disease involving coronary bypass graft of native heart with angina pectoris ([place])   . BPH with obstruction/lower urinary tract symptoms 03/06/2017  . Angina pectoris ([place]) 03/05/2017  . Osteoarthritis of right hip 02/07/2016  . Status post total replacement of right hip 02/07/2016  . Abnormal cardiovascular function study   . Abnormal myocardial perfusion study 06/09/2014  . Cardiac murmur 05/21/2014  . Aneurysm of thoracic aorta ([place]) 04/13/2010  . Mixed hyperlipidemia 07/20/2009  . Essential hypertension, benign 07/20/2009  . Coronary atherosclerosis of native coronary artery  07/20/2009    Past Surgical History:  Procedure Laterality Date  . ASCENDING AORTIC ANEURYSM REPAIR  12/2011 [facility]   26 mm.Dacron graft  . CORONARY ARTERY BYPASS GRAFT  12/2011 [facility]   LIMA to LAD, SVG to PLB, SVG to ramus, SVG to OM  . CORONARY ARTERY BYPASS GRAFT N/A 08/06/2017   Procedure: REDO CORONARY ARTERY BYPASS GRAFTING (CABG) TIMES FOUR UTILIZING LEFT GREATER SAPHENOUS VEIN HAVESTED ENDOVASCULARLY, LEFT RADIAL ARTERY.  RIGHT AXILLARY CANNULATION;  Surgeon: Gaye Pollack, MD;  Location: [place];  Service: Open Heart Surgery;  Laterality: N/A;  . HERNIA REPAIR    . LEFT HEART CATH AND CORS/GRAFTS ANGIOGRAPHY N/A 07/11/2017   Procedure: LEFT HEART CATH AND CORS/GRAFTS ANGIOGRAPHY;  Surgeon: Troy Sine, MD;  Location: [place] CV LAB;  Service: Cardiovascular;  Laterality: N/A;  . LEFT HEART CATHETERIZATION WITH CORONARY ANGIOGRAM N/A 06/18/2014   Procedure: LEFT HEART CATHETERIZATION WITH CORONARY ANGIOGRAM;  Surgeon: Blane Ohara, MD;  Location: [facility] CATH LAB;  Service: Cardiovascular;  Laterality: N/A;  . RADIAL ARTERY HARVEST Left 08/06/2017   Procedure: RADIAL ARTERY HARVEST;  Surgeon: Gaye Pollack, MD;  Location: [place];  Service: Open Heart Surgery;  Laterality: Left;  . TEE WITHOUT CARDIOVERSION N/A 08/06/2017   Procedure: TRANSESOPHAGEAL ECHOCARDIOGRAM (TEE);  Surgeon: Gaye Pollack, MD;  Location: [place];  Service: Open Heart Surgery;  Laterality: N/A;  . TOTAL HIP ARTHROPLASTY Right 02/07/2016   Procedure: RIGHT TOTAL HIP ARTHROPLASTY ANTERIOR APPROACH;  Surgeon:  Mcarthur Rossetti, MD;  Location: [place];  Service: Orthopedics;  Laterality: Right;  . TRANSURETHRAL RESECTION OF PROSTATE          Home Medications    Prior to Admission medications   Medication Sig Start Date End Date Taking? Authorizing Provider  amiodarone (PACERONE) 200 MG tablet TAKE 1/2 TABLET BY MOUTH DAILY (CUT IN HALF FOR PATIENT) 12/17/17  Yes Satira Sark, MD  aspirin EC 81 MG  tablet Take 81 mg by mouth daily.   Yes [provider]  atorvastatin (LIPITOR) 40 MG tablet Take 1 tablet (40 mg total) by mouth at bedtime. 12/13/17  Yes Satira Sark, MD  furosemide (LASIX) 20 MG tablet Take 2 tablets (40 mg total) by mouth daily. Patient taking differently: Take 40 mg by mouth daily as needed for fluid.  08/30/17  Yes Satira Sark, MD  lisinopril (PRINIVIL,ZESTRIL) 10 MG tablet Take 1 tablet (10 mg total) by mouth every morning. & 5 mg in the evening 03/27/18 06/25/18 Yes Satira Sark, MD  metoprolol tartrate (LOPRESSOR) 25 MG tablet TAKE 1 TABLET BY MOUTH TWICE DAILY 12/17/17  Yes Satira Sark, MD  Multiple Vitamin (MULTIVITAMIN) tablet Take 1 tablet by mouth at bedtime.    Yes [provider]  nitroGLYCERIN (NITROSTAT) 0.4 MG SL tablet PLACE 1 TABLET UNDER TONGUE EVERY 5 MINUTES AS NEEDED FOR CHEST PAIN 02/05/18  Yes Satira Sark, MD  Omega-3 Fatty Acids (FISH OIL) 1000 MG CAPS Take 1,000 capsules by mouth daily.   Yes [provider]  Tamsulosin HCl (FLOMAX) 0.4 MG CAPS Take 0.4 mg by mouth daily.    Yes [provider]  methylPREDNISolone (MEDROL) 4 MG tablet Medrol dose pack. Take as instructed Patient not taking: Reported on 06/25/2018 04/14/18   Mcarthur Rossetti, MD    Family History Family History  Problem Relation Age of Onset  . Hypertension Father   . Parkinson's disease Father   . Hypertension Mother   . Lung cancer Sister     Social History Social History   Tobacco Use  . Smoking status: Never Smoker  . Smokeless tobacco: Never Used  . Tobacco comment: tobacco use - no  Substance Use Topics  . Alcohol use: Yes    Alcohol/week: 0.0 standard drinks    Comment: Occasional  . Drug use: No     Allergies   Patient has no known allergies.   Review of Systems Review of Systems  Constitutional: Negative for appetite change.  HENT: Negative for congestion.   Respiratory: Positive for  shortness of breath.   Cardiovascular: Positive for chest pain.  Gastrointestinal: Negative for abdominal pain.  Genitourinary: Negative for flank pain.  Musculoskeletal: Negative for back pain.  Neurological: Negative for weakness.  Psychiatric/Behavioral: Negative for confusion.     Physical Exam Updated Vital Signs BP (!) 119/51   Pulse (!) 58   Resp 14   Ht 5\' 7"  (1.702 m)   Wt 87.1 kg   SpO2 94%   BMI 30.07 kg/m   Physical Exam HENT:     Head: Atraumatic.     Mouth/Throat:     Mouth: Mucous membranes are moist.  Neck:     Musculoskeletal: Neck supple.  Cardiovascular:     Comments: Tachycardia. Pulmonary:     Effort: Pulmonary effort is normal.  Abdominal:     Tenderness: There is no abdominal tenderness.  Musculoskeletal:     Right lower leg: Edema present.  Left lower leg: Edema present.  Skin:    General: Skin is warm.     Capillary Refill: Capillary refill takes less than 2 seconds.  Neurological:     General: No focal deficit present.     Mental Status: He is alert.      ED Treatments / Results  Labs (all labs ordered are listed, but only abnormal results are displayed) Labs Reviewed  CBC - Abnormal; Notable for the following components:      Result Value   MCH 25.7 (*)    RDW 19.1 (*)    All other components within normal limits  BASIC METABOLIC PANEL - Abnormal; Notable for the following components:   Potassium 3.3 (*)    CO2 20 (*)    Glucose, Bld 145 (*)    All other components within normal limits  TROPONIN I  MAGNESIUM    EKG EKG Interpretation  Date/Time:  Wednesday June 25 2018 13:45:12 EST Ventricular Rate:  127 PR Interval:    QRS Duration: 155 QT Interval:  400 QTC Calculation: 582 R Axis:   113 Text Interpretation:  Accelerated junctional rhythm Prolonged PR interval Prominent P waves, nondiagnostic RBBB and LPFB new RBBB Reconfirmed by Davonna Belling [id number]) on 06/25/2018 1:53:24 PM   Radiology Dg Chest  Portable 1 View  Result Date: 06/25/2018 CLINICAL DATA:  Tachycardia with chest pain and dyspnea. EXAM: PORTABLE CHEST 1 VIEW COMPARISON:  09/11/2017 FINDINGS: Stable cardiomegaly with aortic atherosclerosis and post CABG change. Mild central vascular congestion and bibasilar atelectasis is noted. No alveolar confluence. No pneumothorax or effusion. No acute nor suspicious osseous abnormalities. IMPRESSION: Stable cardiomegaly with aortic atherosclerosis. Mild central vascular congestion and bibasilar atelectasis. Electronically Signed   By: Ashley Royalty M.D.   On: 06/25/2018 14:29    Procedures Procedures (including critical care time)  Medications Ordered in ED Medications  potassium chloride 10 mEq in 100 mL IVPB (0 mEq Intravenous Stopped 06/25/18 1630)     Initial Impression / Assessment and Plan / ED Course  I have reviewed the triage vital signs and the nursing notes.  Pertinent labs & imaging results that were available during my care of the patient were reviewed by me and considered in my medical decision making (see chart for details).     Patient with tachycardia.  Right bundle branch block that appears to be rate related.  Chest pain resolved after spontaneous resolution of what was likely an SVT.  Is on metoprolol and amiodarone already.  Discharge home to follow-up with cardiology as needed.  Mild hypokalemia supplemented IV.  Final Clinical Impressions(s) / ED Diagnoses   Final diagnoses:  SVT (supraventricular tachycardia) [facility])    ED Discharge Orders    None       Davonna Belling, MD 06/25/18 1642

## 2018-06-25 NOTE — Discharge Instructions (Addendum)
Follow-up with your cardiologist about your recurrent SVT episode.

## 2018-06-25 NOTE — ED Triage Notes (Signed)
PT states he was in cardiac rehab this morning and stated having a high heart rate with some chest pain and SOB and was brought over to ED for eval. PT states he is on Metoprolol for his heart rate twice a day.

## 2018-07-01 ENCOUNTER — Other Ambulatory Visit: Payer: Self-pay | Admitting: Cardiology

## 2018-07-22 ENCOUNTER — Ambulatory Visit (INDEPENDENT_AMBULATORY_CARE_PROVIDER_SITE_OTHER): Payer: Medicare Other

## 2018-07-22 ENCOUNTER — Encounter: Payer: Self-pay | Admitting: Orthopaedic Surgery

## 2018-07-22 ENCOUNTER — Ambulatory Visit: Payer: Medicare Other | Admitting: Orthopaedic Surgery

## 2018-07-22 ENCOUNTER — Other Ambulatory Visit: Payer: Self-pay

## 2018-07-22 VITALS — BP 139/70 | HR 57 | Ht 67.0 in | Wt 204.0 lb

## 2018-07-22 DIAGNOSIS — M25562 Pain in left knee: Secondary | ICD-10-CM

## 2018-07-22 NOTE — Progress Notes (Signed)
Patient PX:Eric Beltran Bobette Mo., male DOB:Jun 06, 1940, 78 y.o. WNI:[id number]  Chief Complaint  Patient presents with  . Knee Pain    left    HPI  Eric Beltran. is a 78 y.o. male who started having left knee about a month ago that has gotten worse.  He has medial knee pain and some swelling.  He has no giving way, no redness, no trauma.  He says nothing seems to make it better.  He has tried ice, heat, rest.     Body mass index is 31.95 kg/m.  ROS  Review of Systems  Constitutional: Positive for activity change.  Musculoskeletal: Positive for arthralgias, gait problem and joint swelling.  All other systems reviewed and are negative.  For Review of Systems, all other systems reviewed and are negative.  The following is a summary of the past history medically, past history surgically, known current medicines, social history and family history.  This information is gathered electronically by the computer from prior information and documentation.  I review this each visit and have found including this information at this point in the chart is beneficial and informative.   Past Medical History:  Diagnosis Date  . Arthritis   . Ascending aortic aneurysm  [facility])    Status post repair 12/2011 at [facility]  . BPH (benign prostatic hyperplasia)   . Chronic diastolic CHF (congestive heart failure) ([place])   . Coronary atherosclerosis of native coronary artery    a. Multivessel status post prior stenting and ultimately CABG 12/2011 at [facility] with with LIMA to LAD, SVG to PLB, SVG to ramus, SVG to OM . b. Last cath 06/2014 (following ischemic nuc) -> medical therapy, no feasible way to revascularize LCx territory.  . Essential hypertension   . Mixed hyperlipidemia   . Myocardial infarction ([place])   . Postoperative atrial fibrillation ([place])   . Supraventricular tachycardia [facility])     Past Surgical History:  Procedure Laterality Date  . ASCENDING AORTIC ANEURYSM REPAIR  12/2011 [facility]   26  mm.Dacron graft  . CORONARY ARTERY BYPASS GRAFT  12/2011 [facility]   LIMA to LAD, SVG to PLB, SVG to ramus, SVG to OM  . CORONARY ARTERY BYPASS GRAFT N/A 08/06/2017   Procedure: REDO CORONARY ARTERY BYPASS GRAFTING (CABG) TIMES FOUR UTILIZING LEFT GREATER SAPHENOUS VEIN HAVESTED ENDOVASCULARLY, LEFT RADIAL ARTERY.  RIGHT AXILLARY CANNULATION;  Surgeon: Gaye Pollack, MD;  Location: [place];  Service: Open Heart Surgery;  Laterality: N/A;  . HERNIA REPAIR    . LEFT HEART CATH AND CORS/GRAFTS ANGIOGRAPHY N/A 07/11/2017   Procedure: LEFT HEART CATH AND CORS/GRAFTS ANGIOGRAPHY;  Surgeon: Troy Sine, MD;  Location: [place] CV LAB;  Service: Cardiovascular;  Laterality: N/A;  . LEFT HEART CATHETERIZATION WITH CORONARY ANGIOGRAM N/A 06/18/2014   Procedure: LEFT HEART CATHETERIZATION WITH CORONARY ANGIOGRAM;  Surgeon: Blane Ohara, MD;  Location: [facility] CATH LAB;  Service: Cardiovascular;  Laterality: N/A;  . RADIAL ARTERY HARVEST Left 08/06/2017   Procedure: RADIAL ARTERY HARVEST;  Surgeon: Gaye Pollack, MD;  Location: [employer];  Service: Open Heart Surgery;  Laterality: Left;  . TEE WITHOUT CARDIOVERSION N/A 08/06/2017   Procedure: TRANSESOPHAGEAL ECHOCARDIOGRAM (TEE);  Surgeon: Gaye Pollack, MD;  Location: [place];  Service: Open Heart Surgery;  Laterality: N/A;  . TOTAL HIP ARTHROPLASTY Right 02/07/2016   Procedure: RIGHT TOTAL HIP ARTHROPLASTY ANTERIOR APPROACH;  Surgeon: Mcarthur Rossetti, MD;  Location: [place];  Service: Orthopedics;  Laterality: Right;  . TRANSURETHRAL  RESECTION OF PROSTATE      Current Outpatient Medications on File Prior to Visit  Medication Sig Dispense Refill  . amiodarone (PACERONE) 200 MG tablet TAKE 1/2 TABLET BY MOUTH DAILY (CUT IN HALF FOR PATIENT) 45 tablet 6  . aspirin EC 81 MG tablet Take 81 mg by mouth daily.    [name] atorvastatin (LIPITOR) 40 MG tablet Take 1 tablet (40 mg total) by mouth at bedtime. 90 tablet 3  . dilTIAZem HCl Coated Beads (CARTIA XT PO) Take  by mouth.    . furosemide (LASIX) 20 MG tablet Take 2 tablets (40 mg total) by mouth daily. (Patient taking differently: Take 40 mg by mouth daily as needed for fluid. ) 180 tablet 3  . metoprolol tartrate (LOPRESSOR) 25 MG tablet TAKE 1 TABLET BY MOUTH TWICE DAILY 180 tablet 2  . Multiple Vitamin (MULTIVITAMIN) tablet Take 1 tablet by mouth at bedtime.     . nitroGLYCERIN (NITROSTAT) 0.4 MG SL tablet PLACE 1 TABLET UNDER TONGUE EVERY 5 MINUTES AS NEEDED FOR CHEST PAIN 25 tablet 3  . Omega-3 Fatty Acids (FISH OIL) 1000 MG CAPS Take 1,000 capsules by mouth daily.    . potassium chloride SA (K-DUR,KLOR-CON) 20 MEQ tablet Take 1 tablet (20 mEq total) by mouth 2 (two) times daily. 8 tablet 0  . Tamsulosin HCl (FLOMAX) 0.4 MG CAPS Take 0.4 mg by mouth daily.     [name] lisinopril (PRINIVIL,ZESTRIL) 10 MG tablet Take 1 tablet (10 mg total) by mouth every morning. & 5 mg in the evening (Patient not taking: Reported on 07/22/2018) 90 tablet 3   No current facility-administered medications on file prior to visit.     Social History   Socioeconomic History  . Marital status: Widowed    Spouse name: Not on file  . Number of children: Not on file  . Years of education: Not on file  . Highest education level: Not on file  Occupational History  . Not on file  Social Needs  . Financial resource strain: Not on file  . Food insecurity:    Worry: Not on file    Inability: Not on file  . Transportation needs:    Medical: Not on file    Non-medical: Not on file  Tobacco Use  . Smoking status: Never Smoker  . Smokeless tobacco: Never Used  . Tobacco comment: tobacco use - no  Substance and Sexual Activity  . Alcohol use: Yes    Alcohol/week: 0.0 standard drinks    Comment: Occasional  . Drug use: No  . Sexual activity: Not on file  Lifestyle  . Physical activity:    Days per week: Not on file    Minutes per session: Not on file  . Stress: Not on file  Relationships  . Social connections:     Talks on phone: Not on file    Gets together: Not on file    Attends religious service: Not on file    Active member of club or organization: Not on file    Attends meetings of clubs or organizations: Not on file    Relationship status: Not on file  . Intimate partner violence:    Fear of current or ex partner: Not on file    Emotionally abused: Not on file    Physically abused: Not on file    Forced sexual activity: Not on file  Other Topics Concern  . Not on file  Social History Narrative   Retired, widower, does not  get regular exercise.           Family History  Problem Relation Age of Onset  . Hypertension Father   . Parkinson's disease Father   . Hypertension Mother   . Lung cancer Sister     BP 139/70   Pulse (!) 57   Ht 5\' 7"  (1.702 m)   Wt 204 lb (92.5 kg)   BMI 31.95 kg/m   Body mass index is 31.95 kg/m.  All other systems reviewed and are negative.  The following is a summary of the past history medically, past history surgically, known current medicines, social history and family history.  This information is gathered electronically by the computer from prior information and documentation.  I review this each visit and have found including this information at this point in the chart is beneficial and informative.    Past Medical History:  Diagnosis Date  . Arthritis   . Ascending aortic aneurysm [facility])    Status post repair 12/2011 at [facility]  . BPH (benign prostatic hyperplasia)   . Chronic diastolic CHF (congestive heart failure) ([place])   . Coronary atherosclerosis of native coronary artery    a. Multivessel status post prior stenting and ultimately CABG 12/2011 at [facility] with with LIMA to LAD, SVG to PLB, SVG to ramus, SVG to OM . b. Last cath 06/2014 (following ischemic nuc) -> medical therapy, no feasible way to revascularize LCx territory.  . Essential hypertension   . Mixed hyperlipidemia   . Myocardial infarction ([place])   . Postoperative atrial  fibrillation ([place])   . Supraventricular tachycardia [facility])     Past Surgical History:  Procedure Laterality Date  . ASCENDING AORTIC ANEURYSM REPAIR  12/2011 [facility]   26 mm.Dacron graft  . CORONARY ARTERY BYPASS GRAFT  12/2011 [facility]   LIMA to LAD, SVG to PLB, SVG to ramus, SVG to OM  . CORONARY ARTERY BYPASS GRAFT N/A 08/06/2017   Procedure: REDO CORONARY ARTERY BYPASS GRAFTING (CABG) TIMES FOUR UTILIZING LEFT GREATER SAPHENOUS VEIN HAVESTED ENDOVASCULARLY, LEFT RADIAL ARTERY.  RIGHT AXILLARY CANNULATION;  Surgeon: Gaye Pollack, MD;  Location: [place];  Service: Open Heart Surgery;  Laterality: N/A;  . HERNIA REPAIR    . LEFT HEART CATH AND CORS/GRAFTS ANGIOGRAPHY N/A 07/11/2017   Procedure: LEFT HEART CATH AND CORS/GRAFTS ANGIOGRAPHY;  Surgeon: Troy Sine, MD;  Location: [place] CV LAB;  Service: Cardiovascular;  Laterality: N/A;  . LEFT HEART CATHETERIZATION WITH CORONARY ANGIOGRAM N/A 06/18/2014   Procedure: LEFT HEART CATHETERIZATION WITH CORONARY ANGIOGRAM;  Surgeon: Blane Ohara, MD;  Location: [facility] CATH LAB;  Service: Cardiovascular;  Laterality: N/A;  . RADIAL ARTERY HARVEST Left 08/06/2017   Procedure: RADIAL ARTERY HARVEST;  Surgeon: Gaye Pollack, MD;  Location: [place];  Service: Open Heart Surgery;  Laterality: Left;  . TEE WITHOUT CARDIOVERSION N/A 08/06/2017   Procedure: TRANSESOPHAGEAL ECHOCARDIOGRAM (TEE);  Surgeon: Gaye Pollack, MD;  Location: [place];  Service: Open Heart Surgery;  Laterality: N/A;  . TOTAL HIP ARTHROPLASTY Right 02/07/2016   Procedure: RIGHT TOTAL HIP ARTHROPLASTY ANTERIOR APPROACH;  Surgeon: Mcarthur Rossetti, MD;  Location: [place];  Service: Orthopedics;  Laterality: Right;  . TRANSURETHRAL RESECTION OF PROSTATE      Family History  Problem Relation Age of Onset  . Hypertension Father   . Parkinson's disease Father   . Hypertension Mother   . Lung cancer Sister     Social History Social History   Tobacco Use  .  Smoking status: Never  Smoker  . Smokeless tobacco: Never Used  . Tobacco comment: tobacco use - no  Substance Use Topics  . Alcohol use: Yes    Alcohol/week: 0.0 standard drinks    Comment: Occasional  . Drug use: No    No Known Allergies  Current Outpatient Medications  Medication Sig Dispense Refill  . amiodarone (PACERONE) 200 MG tablet TAKE 1/2 TABLET BY MOUTH DAILY (CUT IN HALF FOR PATIENT) 45 tablet 6  . aspirin EC 81 MG tablet Take 81 mg by mouth daily.    [name] atorvastatin (LIPITOR) 40 MG tablet Take 1 tablet (40 mg total) by mouth at bedtime. 90 tablet 3  . dilTIAZem HCl Coated Beads (CARTIA XT PO) Take by mouth.    . furosemide (LASIX) 20 MG tablet Take 2 tablets (40 mg total) by mouth daily. (Patient taking differently: Take 40 mg by mouth daily as needed for fluid. ) 180 tablet 3  . metoprolol tartrate (LOPRESSOR) 25 MG tablet TAKE 1 TABLET BY MOUTH TWICE DAILY 180 tablet 2  . Multiple Vitamin (MULTIVITAMIN) tablet Take 1 tablet by mouth at bedtime.     . nitroGLYCERIN (NITROSTAT) 0.4 MG SL tablet PLACE 1 TABLET UNDER TONGUE EVERY 5 MINUTES AS NEEDED FOR CHEST PAIN 25 tablet 3  . Omega-3 Fatty Acids (FISH OIL) 1000 MG CAPS Take 1,000 capsules by mouth daily.    . potassium chloride SA (K-DUR,KLOR-CON) 20 MEQ tablet Take 1 tablet (20 mEq total) by mouth 2 (two) times daily. 8 tablet 0  . Tamsulosin HCl (FLOMAX) 0.4 MG CAPS Take 0.4 mg by mouth daily.     [name] lisinopril (PRINIVIL,ZESTRIL) 10 MG tablet Take 1 tablet (10 mg total) by mouth every morning. & 5 mg in the evening (Patient not taking: Reported on 07/22/2018) 90 tablet 3   No current facility-administered medications for this visit.      Physical Exam  Blood pressure 139/70, pulse (!) 57, height 5\' 7"  (1.702 m), weight 204 lb (92.5 kg).  Constitutional: overall normal hygiene, normal nutrition, well developed, normal grooming, normal body habitus. Assistive device:none  Musculoskeletal: gait and station Limp left, muscle tone and  strength are normal, no tremors or atrophy is present.  .  Neurological: coordination overall normal.  Deep tendon reflex/nerve stretch intact.  Sensation normal.  Cranial nerves II-XII intact.   Skin:   Normal overall no scars, lesions, ulcers or rashes. No psoriasis.  Psychiatric: Alert and oriented x 3.  Recent memory intact, remote memory unclear.  Normal mood and affect. Well groomed.  Good eye contact.  Cardiovascular: overall no swelling, no varicosities, no edema bilaterally, normal temperatures of the legs and arms, no clubbing, cyanosis and good capillary refill.  Lymphatic: palpation is normal.  Left knee with medial knee pain, slight effusion, ROM 0 to 115, NV intact, some laxity 1/2+ medial collateral ligament, knee stable, limp left.  Slight crepitus is present.  All other systems reviewed and are negative   The patient has been educated about the nature of the problem(s) and counseled on treatment options.  The patient appeared to understand what I have discussed and is in agreement with it.  X-rays were done of the left knee,reported separately.  Negative.  Encounter Diagnosis  Name Primary?  . Acute pain of left knee Yes   PROCEDURE NOTE:  The patient requests injections of the left knee , verbal consent was obtained.  The left knee was prepped appropriately after time out was performed.  Sterile technique was observed and injection of 1 cc of Depo-Medrol 40 mg with several cc's of plain xylocaine. Anesthesia was provided by ethyl chloride and a 20-gauge needle was used to inject the knee area. The injection was tolerated well.  A band aid dressing was applied.  The patient was advised to apply ice later today and tomorrow to the injection sight as needed.   PLAN Call if any problems.  Precautions discussed.  Continue current medications.   Knee sleeve hinged given.  Return to clinic 2 weeks   Consider MRI if needed.  Electronically Signed Sanjuana Kava, MD 3/10/20209:27 AM

## 2018-08-04 ENCOUNTER — Telehealth: Payer: Self-pay | Admitting: Cardiology

## 2018-08-04 NOTE — Telephone Encounter (Signed)
   Primary cardiologist: Dr. Satira Sark  Patient contacted in light of the escalated COVID-19 pandemic in an effort to determine whether his pending routine office follow-up visit in [facility] on March 25 could be potentially deferred.  From a cardiac perspective, he reports no worsening angina symptoms, no increasing shortness of breath, no palpitations or syncope.  He did not voice any specific concerns that require urgent attention and indicated comfort with rescheduling his visit.  Plan: Reschedule routine office visit in [place] for 2 months.  If new symptoms or other concerns intervene, he is encouraged to contact our office.  Satira Sark, M.D., F.A.C.C.

## 2018-08-05 ENCOUNTER — Ambulatory Visit: Payer: Medicare Other | Admitting: Orthopaedic Surgery

## 2018-08-06 ENCOUNTER — Ambulatory Visit: Payer: Medicare Other | Admitting: Orthopaedic Surgery

## 2018-08-06 ENCOUNTER — Encounter: Payer: Self-pay | Admitting: Orthopaedic Surgery

## 2018-08-06 ENCOUNTER — Ambulatory Visit: Payer: Medicare Other | Admitting: Cardiology

## 2018-08-06 ENCOUNTER — Other Ambulatory Visit: Payer: Self-pay

## 2018-08-06 VITALS — Ht 67.0 in | Wt 204.0 lb

## 2018-08-06 DIAGNOSIS — M25562 Pain in left knee: Secondary | ICD-10-CM

## 2018-08-06 DIAGNOSIS — G8929 Other chronic pain: Secondary | ICD-10-CM

## 2018-08-06 NOTE — Progress Notes (Signed)
CC:  I have pain of my left knee. I would like an injection.  The patient has chronic pain of the left knee.  There is no recent trauma.  There is no redness.  Injections in the past have helped.  The knee has no redness, has an effusion and crepitus present.  ROM of the left knee is 0-105.  Impression:  Chronic knee pain left  Return: PRN  PROCEDURE NOTE:  The patient requests injections of the left knee, verbal consent was obtained.  The left knee was prepped appropriately after time out was performed.   Sterile technique was observed and injection of 1 cc of Depo-Medrol 40 mg with several cc's of plain xylocaine. Anesthesia was provided by ethyl chloride and a 20-gauge needle was used to inject the knee area. The injection was tolerated well.  A band aid dressing was applied.  The patient was advised to apply ice later today and tomorrow to the injection sight as needed.  Electronically Signed Sanjuana Kava, MD 3/25/20202:08 PM

## 2018-09-01 ENCOUNTER — Telehealth: Payer: Self-pay | Admitting: Cardiology

## 2018-09-01 ENCOUNTER — Other Ambulatory Visit: Payer: Self-pay | Admitting: Cardiology

## 2018-09-01 MED ORDER — ISOSORBIDE MONONITRATE ER 30 MG PO TB24: 30.0000 mg | ORAL_TABLET | Freq: Every evening | ORAL | 6 refills | Status: DC

## 2018-09-01 NOTE — Telephone Encounter (Signed)
Patient notified and verbalized understanding.   Will send new medication to [facility] now.  

## 2018-09-01 NOTE — Telephone Encounter (Signed)
Noted.  Thank you for the update.  If he is agreeable, we could start a long-acting nitrate in the form of Imdur 30 mg once in the evening.  Possible side effect of headache initially should be discussed, in that case would try one half dose in the evening first and then increase as tolerated.  This may help to better suppress angina symptoms.  If his symptoms continue despite treatment or escalate, he is going to need to be evaluated further.

## 2018-09-01 NOTE — Telephone Encounter (Signed)
Pt c/o of Chest Pain:  1. Are you having CP right now? No  (last pain was last PM)  2. Are you experiencing any other symptoms (ex. SOB, nausea, vomiting, sweating)? No  3. How long have you been experiencing CP? 2 -3  Days  4. Is your CP continuous or coming and going?  States that it comes and goes.  5. Have you taken Nitroglycerin?  Yes (last evening around 10PM)   Patient thinks that he needs to have a Heart Cath.    [phone]

## 2018-09-01 NOTE — Telephone Encounter (Signed)
Patient c/o having chest pain off/on x last week.  Chest pain yesterday afternoon (3-4/10) - took Ntg x 1 with relief.  Happened again last evening (7/10) - also relieved after he took Ntg x 1.  Stated that chest pain can happen anytime, but does seem to be more noticeable with activity (walking up stairs).  No active chest pain going on currently.  No other c/o SOB, dizziness, n/v, or sweating.  Patient informed message will be sent to provider.

## 2018-09-04 ENCOUNTER — Telehealth: Payer: Self-pay | Admitting: Cardiology

## 2018-09-04 MED ORDER — ATORVASTATIN CALCIUM 40 MG PO TABS: 40.0000 mg | ORAL_TABLET | Freq: Every day | ORAL | 3 refills | Status: DC

## 2018-09-04 NOTE — Telephone Encounter (Signed)
° ° °  1. Which medications need to be refilled? (please list name of each medication and dose if known)  ATORVASTATIN 40 MG   2. Which pharmacy/location (including street and city if local pharmacy) is medication to be sent to?  [facility]  3. Do they need a 30 day or 90 day supply? [place]

## 2018-09-08 ENCOUNTER — Telehealth: Payer: Self-pay | Admitting: Cardiology

## 2018-09-08 NOTE — Telephone Encounter (Signed)
Called requested to have CATH  He said that he has had 5 and was correct each time requesting- did not give me any symptoms other than that

## 2018-09-08 NOTE — Telephone Encounter (Signed)
Telephone communcation noted after hours. He called recently with angina symptoms and was started on Imdur. I am concerned that he is still having symptoms which sound like accelerating angina on medical therapy.  We should schedule an office visit in [place] this week for discussion regarding cardiac catheterization. I am covering [place] Na this week and cannot be there so would have DOD see him. This should not wait until the next time I am back in the office.  Satira Sark, M.D., F.A.C.C.   Merlene Laughter, RN at 09/08/2018 4:45 PM   Status: Signed    Reports having heavy chest pain yesterday 10/10. Chewed 15 mg of imdur and one nitroglycerin 0.4 mg and said the pain was relieved in 15-20 minutes. Around 1:00 am took his imdur which is the regular time he takes it daily. Today he worked outside picking up a few brush and the had chest pain again rated 2/10. Took one nitroglycerin tablet and the pain was relieved. Denies dizziness or sob. Advised that he needed an evaluation which will be determined by his provider. Advised if his symptoms return or get worse, that he needed to go to the ED at [facility] for an evaluation. Verbalized understanding.      Cher Nakai R at 09/08/2018 4:27 PM   Status: Signed    Called requested to have CATH  He said that he has had 5 and was correct each time requesting- did not give me any symptoms other than that

## 2018-09-08 NOTE — Telephone Encounter (Signed)
Reports having heavy chest pain yesterday 10/10. Chewed 15 mg of imdur and one nitroglycerin 0.4 mg and said the pain was relieved in 15-20 minutes. Around 1:00 am took his imdur which is the regular time he takes it daily. Today he worked outside picking up a few brush and the had chest pain again rated 2/10. Took one nitroglycerin tablet and the pain was relieved. Denies dizziness or sob. Advised that he needed an evaluation which will be determined by his provider. Advised if his symptoms return or get worse, that he needed to go to the ED at [facility] for an evaluation. Verbalized understanding.

## 2018-09-09 ENCOUNTER — Telehealth: Payer: Self-pay

## 2018-09-09 NOTE — Telephone Encounter (Signed)
Informed patient that his has an apt at 11:40 tomorrow in the [place] office with Dr.Branch

## 2018-09-10 ENCOUNTER — Other Ambulatory Visit (HOSPITAL_COMMUNITY)
Admission: RE | Admit: 2018-09-10 | Discharge: 2018-09-10 | Disposition: A | Discharge status: Home / Self Care | Payer: Medicare Other | Source: Ambulatory Visit | Attending: Cardiology | Admitting: Cardiology

## 2018-09-10 ENCOUNTER — Other Ambulatory Visit: Payer: Self-pay

## 2018-09-10 ENCOUNTER — Other Ambulatory Visit: Payer: Self-pay | Admitting: Cardiology

## 2018-09-10 ENCOUNTER — Encounter: Payer: Self-pay | Admitting: Cardiology

## 2018-09-10 ENCOUNTER — Telehealth: Payer: Self-pay | Admitting: *Deleted

## 2018-09-10 ENCOUNTER — Ambulatory Visit (INDEPENDENT_AMBULATORY_CARE_PROVIDER_SITE_OTHER): Payer: Medicare Other | Admitting: Cardiology

## 2018-09-10 ENCOUNTER — Telehealth: Payer: Self-pay | Admitting: Cardiology

## 2018-09-10 VITALS — BP 159/83 | HR 54 | Temp 98.4°F | Wt 196.0 lb

## 2018-09-10 DIAGNOSIS — Z01818 Encounter for other preprocedural examination: Secondary | ICD-10-CM | POA: Diagnosis not present

## 2018-09-10 DIAGNOSIS — R079 Chest pain, unspecified: Secondary | ICD-10-CM | POA: Insufficient documentation

## 2018-09-10 DIAGNOSIS — I25119 Atherosclerotic heart disease of native coronary artery with unspecified angina pectoris: Secondary | ICD-10-CM | POA: Diagnosis not present

## 2018-09-10 LAB — CBC WITH DIFFERENTIAL/PLATELET
Abs Immature Granulocytes: 0.1 10*3/uL — ABNORMAL HIGH (ref 0.00–0.07)
Basophils Absolute: 0.1 10*3/uL (ref 0.0–0.1)
Basophils Relative: 1 %
Eosinophils Absolute: 0.1 10*3/uL (ref 0.0–0.5)
Eosinophils Relative: 1 %
HCT: 43.9 % (ref 39.0–52.0)
Hemoglobin: 14.4 g/dL (ref 13.0–17.0)
Immature Granulocytes: 1 %
Lymphocytes Relative: 16 %
Lymphs Abs: 1.6 10*3/uL (ref 0.7–4.0)
MCH: 28.5 pg (ref 26.0–34.0)
MCHC: 32.8 g/dL (ref 30.0–36.0)
MCV: 86.9 fL (ref 80.0–100.0)
Monocytes Absolute: 1.1 10*3/uL — ABNORMAL HIGH (ref 0.1–1.0)
Monocytes Relative: 11 %
Neutro Abs: 6.7 10*3/uL (ref 1.7–7.7)
Neutrophils Relative %: 70 %
Platelets: 236 10*3/uL (ref 150–400)
RBC: 5.05 MIL/uL (ref 4.22–5.81)
RDW: 15.3 % (ref 11.5–15.5)
WBC: 9.6 10*3/uL (ref 4.0–10.5)
nRBC: 0 % (ref 0.0–0.2)

## 2018-09-10 LAB — BASIC METABOLIC PANEL
Anion gap: 10 (ref 5–15)
BUN: 19 mg/dL (ref 8–23)
CO2: 23 mmol/L (ref 22–32)
Calcium: 9 mg/dL (ref 8.9–10.3)
Chloride: 107 mmol/L (ref 98–111)
Creatinine, Ser: 1.03 mg/dL (ref 0.61–1.24)
GFR calc Af Amer: >60 mL/min (ref >60)
GFR calc non Af Amer: >60 mL/min (ref >60)
Glucose, Bld: 107 mg/dL — ABNORMAL HIGH (ref 70–99)
Potassium: 4.1 mmol/L (ref 3.5–5.1)
Sodium: 140 mmol/L (ref 135–145)

## 2018-09-10 MED ORDER — AMIODARONE HCL 200 MG PO TABS: ORAL_TABLET | ORAL | 6 refills | Status: DC

## 2018-09-10 NOTE — Progress Notes (Addendum)
Clinical Summary Eric Beltran is a 78 y.o.male seen today as an add on patient for chest pain. Regular patient of Dr Eric Beltran   1. CAD/Chest pain - CABG 2013 at [facility]: LIMA-LAD, SVG-PLB, SVG-ramus, SVG-OM. Had prior stenting - redo CABG 07/2017 left radial to diag, SVG-OM, SVG-PDA, SVG-PL2 - 06/2017 cath   - symptoms started 1 month. Tightness across entire chest, 3/10 in severity. Would come on with walking with stairs. No other associated symptoms. He began limiting his activities - most intesnse episode Sunday. Occurred while at rest. Started as tightness, escalated to 10/10. Got diaphoretic. No SOB. Checked bp and SBP 200. Took and chewed an imdur with mild improvement. Then got a NG from his neighbor, significant improvement. Whole episode lasted 20-30 minutes.  - still exertional pain since that time, carrying limbs or walking up stairs. Better with NG - symptoms similar to what he had before his last bypass.  - started imdur 30mg  daily recently without benefit   2. HTN - compliant with meds  3. Hyperlipidemia - compliant with meds  4. PSVT - on beta blocker and amiodarone     Past Medical History:  Diagnosis Date   Arthritis    Ascending aortic aneurysm ([place])    Status post repair 12/2011 at [facility]   BPH (benign prostatic hyperplasia)    Chronic diastolic CHF (congestive heart failure) ([place])    Coronary atherosclerosis of native coronary artery    a. Multivessel status post prior stenting and ultimately CABG 12/2011 at [facility] with with LIMA to LAD, SVG to PLB, SVG to ramus, SVG to OM . b. Last cath 06/2014 (following ischemic nuc) -> medical therapy, no feasible way to revascularize LCx territory.   Essential hypertension    Mixed hyperlipidemia    Myocardial infarction (HCC)    Postoperative atrial fibrillation (HCC)    Supraventricular tachycardia (HCC)      No Known Allergies   Current Outpatient Medications  Medication Sig Dispense Refill    aspirin EC 81 MG tablet Take 81 mg by mouth daily.     atorvastatin (LIPITOR) 40 MG tablet Take 1 tablet (40 mg total) by mouth at bedtime. 90 tablet 3   diltiazem (CARDIZEM CD) 240 MG 24 hr capsule Take 240 mg by mouth daily.     furosemide (LASIX) 20 MG tablet TAKE TWO TABLETS BY MOUTH DAILY 180 tablet 3   isosorbide mononitrate (IMDUR) 30 MG 24 hr tablet Take 1 tablet (30 mg total) by mouth every evening for 30 days. 30 tablet 6   metoprolol tartrate (LOPRESSOR) 25 MG tablet TAKE 1 TABLET BY MOUTH TWICE DAILY 180 tablet 2   Multiple Vitamin (MULTIVITAMIN) tablet Take 1 tablet by mouth at bedtime.      nitroGLYCERIN (NITROSTAT) 0.4 MG SL tablet PLACE 1 TABLET UNDER TONGUE EVERY 5 MINUTES AS NEEDED FOR CHEST PAIN 25 tablet 3   potassium chloride SA (K-DUR,KLOR-CON) 20 MEQ tablet Take 1 tablet (20 mEq total) by mouth 2 (two) times daily. (Patient taking differently: Take 20 mEq by mouth daily. ) 8 tablet 0   Tamsulosin HCl (FLOMAX) 0.4 MG CAPS Take 0.4 mg by mouth daily.      amiodarone (PACERONE) 200 MG tablet TAKE 1/2 TABLET BY MOUTH DAILY (CUT IN HALF FOR PATIENT) (Patient not taking: Reported on 09/10/2018) 45 tablet 6   Omega-3 Fatty Acids (FISH OIL) 1000 MG CAPS Take 1,000 capsules by mouth daily.     No current facility-administered medications for  this visit.      Past Surgical History:  Procedure Laterality Date   ASCENDING AORTIC ANEURYSM REPAIR  12/2011 [facility]   26 mm.Dacron graft   CORONARY ARTERY BYPASS GRAFT  12/2011 [facility]   LIMA to LAD, SVG to PLB, SVG to ramus, SVG to OM   CORONARY ARTERY BYPASS GRAFT N/A 08/06/2017   Procedure: REDO CORONARY ARTERY BYPASS GRAFTING (CABG) TIMES FOUR UTILIZING LEFT GREATER SAPHENOUS VEIN HAVESTED ENDOVASCULARLY, LEFT RADIAL ARTERY.  RIGHT AXILLARY CANNULATION;  Surgeon: Eric Pollack, MD;  Location: [place];  Service: Open Heart Surgery;  Laterality: N/A;   HERNIA REPAIR     LEFT HEART CATH AND CORS/GRAFTS ANGIOGRAPHY N/A  07/11/2017   Procedure: LEFT HEART CATH AND CORS/GRAFTS ANGIOGRAPHY;  Surgeon: Eric Sine, MD;  Location: [place] CV LAB;  Service: Cardiovascular;  Laterality: N/A;   LEFT HEART CATHETERIZATION WITH CORONARY ANGIOGRAM N/A 06/18/2014   Procedure: LEFT HEART CATHETERIZATION WITH CORONARY ANGIOGRAM;  Surgeon: Eric Ohara, MD;  Location: [facility] C[facility];  Service: Cardiovascular;  Laterality: N/A;   RADIAL ARTERY HARVEST Left 08/06/2017   Procedure: RADIAL ARTERY HARVEST;  Surgeon: Eric Pollack, MD;  Location: [place];  Service: Open Heart Surgery;  Laterality: Left;   TEE WITHOUT CARDIOVERSION N/A 08/06/2017   Procedure: TRANSESOPHAGEAL ECHOCARDIOGRAM (TEE);  Surgeon: Eric Pollack, MD;  Location: [place];  Service: Open Heart Surgery;  Laterality: N/A;   TOTAL HIP ARTHROPLASTY Right 02/07/2016   Procedure: RIGHT TOTAL HIP ARTHROPLASTY ANTERIOR APPROACH;  Surgeon: Eric Rossetti, MD;  Location: [place];  Service: Orthopedics;  Laterality: Right;   TRANSURETHRAL RESECTION OF PROSTATE       No Known Allergies    Family History  Problem Relation Age of Onset   Hypertension Father    Parkinson's disease Father    Hypertension Mother    Lung cancer Sister      Social History Eric Beltran reports that he has never smoked. He has never used smokeless tobacco. Eric Beltran reports current alcohol use.   Review of Systems CONSTITUTIONAL: No weight loss, fever, chills, weakness or fatigue.  HEENT: Eyes: No visual loss, blurred vision, double vision or yellow sclerae.No hearing loss, sneezing, congestion, runny nose or sore throat.  SKIN: No rash or itching.  CARDIOVASCULAR: per hpi RESPIRATORY: per hpi GASTROINTESTINAL: No anorexia, nausea, vomiting or diarrhea. No abdominal pain or blood.  GENITOURINARY: No burning on urination, no polyuria NEUROLOGICAL: No headache, dizziness, syncope, paralysis, ataxia, numbness or tingling in the extremities. No change in bowel or  bladder control.  MUSCULOSKELETAL: No muscle, back pain, joint pain or stiffness.  LYMPHATICS: No enlarged nodes. No history of splenectomy.  PSYCHIATRIC: No history of depression or anxiety.  ENDOCRINOLOGIC: No reports of sweating, cold or heat intolerance. No polyuria or polydipsia.  [name]   Physical Examination Vitals:   09/10/18 1136  BP: (!) 159/83  Pulse: (!) 54  Temp: 98.4 F (36.9 C)  SpO2: 94%   Filed Weights   09/10/18 1136  Weight: 196 lb (88.9 kg)    Gen: resting comfortably, no acute distress HEENT: no scleral icterus, pupils equal round and reactive, no palptable cervical adenopathy,  CV: RRR, 2/6 diastolic mumrur RUSB, no jvd Resp: Clear to auscultation bilaterally GI: abdomen is soft, non-tender, non-distended, normal bowel sounds, no hepatosplenomegaly MSK: extremities are warm, no edema.  Skin: warm, no rash Neuro:  no focal deficits Psych: appropriate affect   Diagnostic Studies  06/2017 cath  Prox RCA to  Mid RCA lesion is 30% stenosed.  Mid RCA lesion is 20% stenosed.  Post Atrio lesion is 95% stenosed.  Ost Cx to Prox Cx lesion is 100% stenosed.  Ost 1st Diag lesion is 90% stenosed.  Prox LAD-1 lesion is 80% stenosed.  Prox LAD-2 lesion is 70% stenosed.  The left ventricular ejection fraction is 50-55% by visual estimate.  The left ventricular systolic function is normal.  LV end diastolic pressure is mildly elevated.   Normal global LV function with ejection fraction at 50-55%.  Severe native CAD with 80% proximal calcified LAD stenosis in the region of the first diagonal vessel with 90% ostial first diagonal stenosis and 60-70% diffuse LAD stenosis after the second diagonal vessel with competitive filling beyond this from the LIMA graft; total ostial occlusion of the left circumflex coronary artery at site of prior stenting.  There is collateralization to 2 branches of the circumflex via the left injection.  Large, very dominant RCA with  diffuse calcification with 30% narrowing in the mid segment, a patent stent proximal to the acute margin with 20% intimal aplasia, and apparent significant calcific rocklike segment in the region just beyond the very large PDA takeoff and prior to to large inferior LV and posterior lateral branches.  Occlusion of the previous 3 saphenous vein conduits at its origin in the aorta.  Patent LIMA graft supplying the mid LAD.  RECOMMENDATION: Angiographic findings were reviewed with Dr. Burt Knack.  It appears that the 90% very calcified "rock "in the dista RCA jeopardizes a very large distal RCA system.  This may not be amenable to atherectomy and and would be very difficult to stent which would also potentially jail these large Kimbree Casanas vessels.  With his significant concomitant disease with total occlusion of the circumflex and collateralized large branches that are good surgical targets and in addition to his proximal LAD and diagonal vessel stenosis outpatient surgical consultation will be initially obtained for consideration of redo CABG revascularization.  Isosorbide will be added to his medical regimen.    Assessment and Plan   1. CAD with unspecified angina - extensive CAD history as reported about with prior stents and CABG both in 2013 and 2019 - describes typical symptoms of progressing angina - given his history of progressing symptoms we will plan for cath this Friday - continue current meds - EKG today sinus brady, no acute ischemic changes  F/u Dr Eric Beltran 3-4 weeks.   Arnoldo Lenis, M.D.

## 2018-09-10 NOTE — Telephone Encounter (Signed)
Pt aware - routed to pcp  

## 2018-09-10 NOTE — Telephone Encounter (Signed)
Pre-cert Verification for the following procedure  :  Left heart cath  Scheduled for 09/12/2018

## 2018-09-10 NOTE — H&P (View-Only) (Signed)
Clinical Summary Eric Beltran is a 78 y.o.male seen today as an add on patient for chest pain. Regular patient of Dr Eric Beltran   1. CAD/Chest pain - CABG 2013 at [facility]: LIMA-LAD, SVG-PLB, SVG-ramus, SVG-OM. Had prior stenting - redo CABG 07/2017 left radial to diag, SVG-OM, SVG-PDA, SVG-PL2 - 06/2017 cath   - symptoms started 1 month. Tightness across entire chest, 3/10 in severity. Would come on with walking with stairs. No other associated symptoms. He began limiting his activities - most intesnse episode Sunday. Occurred while at rest. Started as tightness, escalated to 10/10. Got diaphoretic. No SOB. Checked bp and SBP 200. Took and chewed an imdur with mild improvement. Then got a NG from his neighbor, significant improvement. Whole episode lasted 20-30 minutes.  - still exertional pain since that time, carrying limbs or walking up stairs. Better with NG - symptoms similar to what he had before his last bypass.  - started imdur 30mg  daily recently without benefit   2. HTN - compliant with meds  3. Hyperlipidemia - compliant with meds  4. PSVT - on beta blocker and amiodarone     Past Medical History:  Diagnosis Date   Arthritis    Ascending aortic aneurysm ([place])    Status post repair 12/2011 at [facility]   BPH (benign prostatic hyperplasia)    Chronic diastolic CHF (congestive heart failure) ([place])    Coronary atherosclerosis of native coronary artery    a. Multivessel status post prior stenting and ultimately CABG 12/2011 at [facility] with with LIMA to LAD, SVG to PLB, SVG to ramus, SVG to OM . b. Last cath 06/2014 (following ischemic nuc) -> medical therapy, no feasible way to revascularize LCx territory.   Essential hypertension    Mixed hyperlipidemia    Myocardial infarction (HCC)    Postoperative atrial fibrillation (HCC)    Supraventricular tachycardia (HCC)      No Known Allergies   Current Outpatient Medications  Medication Sig Dispense Refill    aspirin EC 81 MG tablet Take 81 mg by mouth daily.     atorvastatin (LIPITOR) 40 MG tablet Take 1 tablet (40 mg total) by mouth at bedtime. 90 tablet 3   diltiazem (CARDIZEM CD) 240 MG 24 hr capsule Take 240 mg by mouth daily.     furosemide (LASIX) 20 MG tablet TAKE TWO TABLETS BY MOUTH DAILY 180 tablet 3   isosorbide mononitrate (IMDUR) 30 MG 24 hr tablet Take 1 tablet (30 mg total) by mouth every evening for 30 days. 30 tablet 6   metoprolol tartrate (LOPRESSOR) 25 MG tablet TAKE 1 TABLET BY MOUTH TWICE DAILY 180 tablet 2   Multiple Vitamin (MULTIVITAMIN) tablet Take 1 tablet by mouth at bedtime.      nitroGLYCERIN (NITROSTAT) 0.4 MG SL tablet PLACE 1 TABLET UNDER TONGUE EVERY 5 MINUTES AS NEEDED FOR CHEST PAIN 25 tablet 3   potassium chloride SA (K-DUR,KLOR-CON) 20 MEQ tablet Take 1 tablet (20 mEq total) by mouth 2 (two) times daily. (Patient taking differently: Take 20 mEq by mouth daily. ) 8 tablet 0   Tamsulosin HCl (FLOMAX) 0.4 MG CAPS Take 0.4 mg by mouth daily.      amiodarone (PACERONE) 200 MG tablet TAKE 1/2 TABLET BY MOUTH DAILY (CUT IN HALF FOR PATIENT) (Patient not taking: Reported on 09/10/2018) 45 tablet 6   Omega-3 Fatty Acids (FISH OIL) 1000 MG CAPS Take 1,000 capsules by mouth daily.     No current facility-administered medications for  this visit.      Past Surgical History:  Procedure Laterality Date   ASCENDING AORTIC ANEURYSM REPAIR  12/2011 [facility]   26 mm.Dacron graft   CORONARY ARTERY BYPASS GRAFT  12/2011 [facility]   LIMA to LAD, SVG to PLB, SVG to ramus, SVG to OM   CORONARY ARTERY BYPASS GRAFT N/A 08/06/2017   Procedure: REDO CORONARY ARTERY BYPASS GRAFTING (CABG) TIMES FOUR UTILIZING LEFT GREATER SAPHENOUS VEIN HAVESTED ENDOVASCULARLY, LEFT RADIAL ARTERY.  RIGHT AXILLARY CANNULATION;  Surgeon: Gaye Pollack, MD;  Location: [place];  Service: Open Heart Surgery;  Laterality: N/A;   HERNIA REPAIR     LEFT HEART CATH AND CORS/GRAFTS ANGIOGRAPHY N/A  07/11/2017   Procedure: LEFT HEART CATH AND CORS/GRAFTS ANGIOGRAPHY;  Surgeon: Troy Sine, MD;  Location: [place] CV LAB;  Service: Cardiovascular;  Laterality: N/A;   LEFT HEART CATHETERIZATION WITH CORONARY ANGIOGRAM N/A 06/18/2014   Procedure: LEFT HEART CATHETERIZATION WITH CORONARY ANGIOGRAM;  Surgeon: Blane Ohara, MD;  Location: [facility] CATH LAB;  Service: Cardiovascular;  Laterality: N/A;   RADIAL ARTERY HARVEST Left 08/06/2017   Procedure: RADIAL ARTERY HARVEST;  Surgeon: Gaye Pollack, MD;  Location: [place];  Service: Open Heart Surgery;  Laterality: Left;   TEE WITHOUT CARDIOVERSION N/A 08/06/2017   Procedure: TRANSESOPHAGEAL ECHOCARDIOGRAM (TEE);  Surgeon: Gaye Pollack, MD;  Location: [place];  Service: Open Heart Surgery;  Laterality: N/A;   TOTAL HIP ARTHROPLASTY Right 02/07/2016   Procedure: RIGHT TOTAL HIP ARTHROPLASTY ANTERIOR APPROACH;  Surgeon: Mcarthur Rossetti, MD;  Location: [place];  Service: Orthopedics;  Laterality: Right;   TRANSURETHRAL RESECTION OF PROSTATE       No Known Allergies    Family History  Problem Relation Age of Onset   Hypertension Father    Parkinson's disease Father    Hypertension Mother    Lung cancer Sister      Social History Eric Beltran reports that he has never smoked. He has never used smokeless tobacco. Eric Beltran reports current alcohol use.   Review of Systems CONSTITUTIONAL: No weight loss, fever, chills, weakness or fatigue.  HEENT: Eyes: No visual loss, blurred vision, double vision or yellow sclerae.No hearing loss, sneezing, congestion, runny nose or sore throat.  SKIN: No rash or itching.  CARDIOVASCULAR: per hpi RESPIRATORY: per hpi GASTROINTESTINAL: No anorexia, nausea, vomiting or diarrhea. No abdominal pain or blood.  GENITOURINARY: No burning on urination, no polyuria NEUROLOGICAL: No headache, dizziness, syncope, paralysis, ataxia, numbness or tingling in the extremities. No change in bowel or  bladder control.  MUSCULOSKELETAL: No muscle, back pain, joint pain or stiffness.  LYMPHATICS: No enlarged nodes. No history of splenectomy.  PSYCHIATRIC: No history of depression or anxiety.  ENDOCRINOLOGIC: No reports of sweating, cold or heat intolerance. No polyuria or polydipsia.  [name]   Physical Examination Vitals:   09/10/18 1136  BP: (!) 159/83  Pulse: (!) 54  Temp: 98.4 F (36.9 C)  SpO2: 94%   Filed Weights   09/10/18 1136  Weight: 196 lb (88.9 kg)    Gen: resting comfortably, no acute distress HEENT: no scleral icterus, pupils equal round and reactive, no palptable cervical adenopathy,  CV: RRR, 2/6 diastolic mumrur RUSB, no jvd Resp: Clear to auscultation bilaterally GI: abdomen is soft, non-tender, non-distended, normal bowel sounds, no hepatosplenomegaly MSK: extremities are warm, no edema.  Skin: warm, no rash Neuro:  no focal deficits Psych: appropriate affect   Diagnostic Studies  06/2017 cath  Prox RCA to  Mid RCA lesion is 30% stenosed.  Mid RCA lesion is 20% stenosed.  Post Atrio lesion is 95% stenosed.  Ost Cx to Prox Cx lesion is 100% stenosed.  Ost 1st Diag lesion is 90% stenosed.  Prox LAD-1 lesion is 80% stenosed.  Prox LAD-2 lesion is 70% stenosed.  The left ventricular ejection fraction is 50-55% by visual estimate.  The left ventricular systolic function is normal.  LV end diastolic pressure is mildly elevated.   Normal global LV function with ejection fraction at 50-55%.  Severe native CAD with 80% proximal calcified LAD stenosis in the region of the first diagonal vessel with 90% ostial first diagonal stenosis and 60-70% diffuse LAD stenosis after the second diagonal vessel with competitive filling beyond this from the LIMA graft; total ostial occlusion of the left circumflex coronary artery at site of prior stenting.  There is collateralization to 2 branches of the circumflex via the left injection.  Large, very dominant RCA with  diffuse calcification with 30% narrowing in the mid segment, a patent stent proximal to the acute margin with 20% intimal aplasia, and apparent significant calcific rocklike segment in the region just beyond the very large PDA takeoff and prior to to large inferior LV and posterior lateral branches.  Occlusion of the previous 3 saphenous vein conduits at its origin in the aorta.  Patent LIMA graft supplying the mid LAD.  RECOMMENDATION: Angiographic findings were reviewed with Dr. Burt Knack.  It appears that the 90% very calcified "rock "in the dista RCA jeopardizes a very large distal RCA system.  This may not be amenable to atherectomy and and would be very difficult to stent which would also potentially jail these large Eric Beltran vessels.  With his significant concomitant disease with total occlusion of the circumflex and collateralized large branches that are good surgical targets and in addition to his proximal LAD and diagonal vessel stenosis outpatient surgical consultation will be initially obtained for consideration of redo CABG revascularization.  Isosorbide will be added to his medical regimen.    Assessment and Plan   1. CAD with unspecified angina - extensive CAD history as reported about with prior stents and CABG both in 2013 and 2019 - describes typical symptoms of progressing angina - given his history of progressing symptoms we will plan for cath this Friday - continue current meds - EKG today sinus brady, no acute ischemic changes  F/u Dr Eric Beltran 3-4 weeks.   Arnoldo Lenis, M.D.

## 2018-09-10 NOTE — Telephone Encounter (Signed)
-----   Message from Arnoldo Lenis, MD sent at 09/10/2018  3:51 PM EDT ----- Labs look fine    Zandra Abts MD

## 2018-09-10 NOTE — Patient Instructions (Addendum)
Medication Instructions: Your physician recommends that you continue on your current medications as directed. Please refer to the Current Medication list given to you today.   Labwork: CBC,BMET today  Procedures/Testing: Left Heart Cath at [facility], see letter with instructions  Follow-Up: 1 month with Dr.McDowell  Any Additional Special Instructions Will Be Listed Below (If Applicable).     If you need a refill on your cardiac medications before your next appointment, please call your pharmacy.

## 2018-09-11 ENCOUNTER — Telehealth: Payer: Self-pay | Admitting: *Deleted

## 2018-09-11 NOTE — Telephone Encounter (Signed)
Pt contacted pre-catheterization scheduled at [facility] for: Friday Sep 12, 2018 9 AM Verified arrival time and place: [place] Entrance A at: 7 AM  No solid food after midnight prior to cath, clear liquids until 5 AM day of procedure.  Hold: Furosemide-AM of procedure KCl-AM of procedure  Except hold medications AM meds can be  taken pre-cath with sip of water including: ASA 81 mg   Confirmed patient has responsible person to drive home post procedure and observe 24 hours after arriving home: yes   Pt advised due to Covid-19 pandemic, [facility] is restricting visitors and only patients should present for check-in prior to their procedure. People will not be allowed to enter [facility] with the patient. At this time [facility] is not allowing visitors to all [facility] campuses.      Cardiac Questionnaire: ____________   [id number] Pre-Screening Questions:  . Do you currently have a fever? no . Have you recently travelled on a cruise, internationally, or to [place], [place], [place], [place], [place], or [place], [place] [employer]) ? no . Have you been in contact with someone that is currently pending confirmation of Covid19 testing or has been confirmed to have the [place] virus?  Not to his knowledge. . Are you currently experiencing fatigue or cough? No . Are you currently experiencing new or worsening shortness of breath at rest or with minimal activity? No . Have you been in contact with someone that was recently sick with fever/cough/fatigue? Not to his knowledge   I reviewed procedure instructions, visitor restrictions, Covid-19 screening questions with patient, he verbalized understanding.

## 2018-09-12 ENCOUNTER — Ambulatory Visit (HOSPITAL_COMMUNITY)
Admission: RE | Admit: 2018-09-12 | Discharge: 2018-09-12 | Disposition: A | Discharge status: Home / Self Care | Payer: Medicare Other | Attending: Cardiovascular Disease | Admitting: Cardiovascular Disease

## 2018-09-12 ENCOUNTER — Encounter (HOSPITAL_COMMUNITY): Payer: Self-pay

## 2018-09-12 ENCOUNTER — Encounter (HOSPITAL_COMMUNITY)
Admission: RE | Disposition: A | Discharge status: Home / Self Care | Payer: Self-pay | Source: Home / Self Care | Attending: Cardiovascular Disease

## 2018-09-12 ENCOUNTER — Other Ambulatory Visit: Payer: Self-pay

## 2018-09-12 DIAGNOSIS — I471 Supraventricular tachycardia: Secondary | ICD-10-CM | POA: Diagnosis not present

## 2018-09-12 DIAGNOSIS — I25119 Atherosclerotic heart disease of native coronary artery with unspecified angina pectoris: Secondary | ICD-10-CM | POA: Diagnosis not present

## 2018-09-12 DIAGNOSIS — I25708 Atherosclerosis of coronary artery bypass graft(s), unspecified, with other forms of angina pectoris: Secondary | ICD-10-CM | POA: Diagnosis not present

## 2018-09-12 DIAGNOSIS — Z955 Presence of coronary angioplasty implant and graft: Secondary | ICD-10-CM | POA: Diagnosis not present

## 2018-09-12 DIAGNOSIS — I4891 Unspecified atrial fibrillation: Secondary | ICD-10-CM | POA: Insufficient documentation

## 2018-09-12 DIAGNOSIS — I5032 Chronic diastolic (congestive) heart failure: Secondary | ICD-10-CM | POA: Insufficient documentation

## 2018-09-12 DIAGNOSIS — E782 Mixed hyperlipidemia: Secondary | ICD-10-CM | POA: Insufficient documentation

## 2018-09-12 DIAGNOSIS — Z7982 Long term (current) use of aspirin: Secondary | ICD-10-CM | POA: Insufficient documentation

## 2018-09-12 DIAGNOSIS — Z8249 Family history of ischemic heart disease and other diseases of the circulatory system: Secondary | ICD-10-CM | POA: Insufficient documentation

## 2018-09-12 DIAGNOSIS — Z951 Presence of aortocoronary bypass graft: Secondary | ICD-10-CM | POA: Diagnosis not present

## 2018-09-12 DIAGNOSIS — I110 Hypertensive heart disease with heart failure: Secondary | ICD-10-CM | POA: Diagnosis not present

## 2018-09-12 DIAGNOSIS — Z79899 Other long term (current) drug therapy: Secondary | ICD-10-CM | POA: Diagnosis not present

## 2018-09-12 DIAGNOSIS — Z95828 Presence of other vascular implants and grafts: Secondary | ICD-10-CM | POA: Diagnosis not present

## 2018-09-12 DIAGNOSIS — I25118 Atherosclerotic heart disease of native coronary artery with other forms of angina pectoris: Secondary | ICD-10-CM | POA: Diagnosis not present

## 2018-09-12 DIAGNOSIS — M1990 Unspecified osteoarthritis, unspecified site: Secondary | ICD-10-CM | POA: Diagnosis not present

## 2018-09-12 DIAGNOSIS — I2584 Coronary atherosclerosis due to calcified coronary lesion: Secondary | ICD-10-CM | POA: Diagnosis not present

## 2018-09-12 DIAGNOSIS — I2510 Atherosclerotic heart disease of native coronary artery without angina pectoris: Secondary | ICD-10-CM

## 2018-09-12 DIAGNOSIS — I252 Old myocardial infarction: Secondary | ICD-10-CM | POA: Diagnosis not present

## 2018-09-12 DIAGNOSIS — N400 Enlarged prostate without lower urinary tract symptoms: Secondary | ICD-10-CM | POA: Insufficient documentation

## 2018-09-12 HISTORY — PX: LEFT HEART CATH AND CORS/GRAFTS ANGIOGRAPHY: CATH118250

## 2018-09-12 SURGERY — LEFT HEART CATH AND CORS/GRAFTS ANGIOGRAPHY
Anesthesia: LOCAL

## 2018-09-12 MED ORDER — SODIUM CHLORIDE 0.9% FLUSH: 3.0000 mL | INTRAVENOUS | Status: DC | PRN

## 2018-09-12 MED ORDER — MIDAZOLAM HCL 2 MG/2ML IJ SOLN
INTRAMUSCULAR | Status: DC | PRN
  Administered 2018-09-12: 2 mg via INTRAVENOUS

## 2018-09-12 MED ORDER — ASPIRIN 81 MG PO CHEW: 81.0000 mg | CHEWABLE_TABLET | Freq: Every day | ORAL | Status: DC

## 2018-09-12 MED ORDER — SODIUM CHLORIDE 0.9 % WEIGHT BASED INFUSION
3.0000 mL/kg/h | INTRAVENOUS | Status: AC
  Administered 2018-09-12: 08:00:00 3 mL/kg/h via INTRAVENOUS

## 2018-09-12 MED ORDER — SODIUM CHLORIDE 0.9 % WEIGHT BASED INFUSION
1.0000 mL/kg/h | INTRAVENOUS | Status: DC
  Administered 2018-09-12: 1 mL/kg/h via INTRAVENOUS

## 2018-09-12 MED ORDER — DIAZEPAM 5 MG PO TABS: 5.0000 mg | ORAL_TABLET | ORAL | Status: DC | PRN

## 2018-09-12 MED ORDER — HYDRALAZINE HCL 20 MG/ML IJ SOLN: 10.0000 mg | INTRAMUSCULAR | Status: DC | PRN

## 2018-09-12 MED ORDER — ISOSORBIDE MONONITRATE ER 60 MG PO TB24
60.0000 mg | ORAL_TABLET | Freq: Every day | ORAL | Status: DC
  Administered 2018-09-12: 60 mg via ORAL
  Filled 2018-09-12: qty 1

## 2018-09-12 MED ORDER — SODIUM CHLORIDE 0.9% FLUSH: 3.0000 mL | Freq: Two times a day (BID) | INTRAVENOUS | Status: DC

## 2018-09-12 MED ORDER — FENTANYL CITRATE (PF) 100 MCG/2ML IJ SOLN
INTRAMUSCULAR | Status: DC | PRN
  Administered 2018-09-12: 25 ug via INTRAVENOUS

## 2018-09-12 MED ORDER — MIDAZOLAM HCL 2 MG/2ML IJ SOLN
INTRAMUSCULAR | Status: AC
  Filled 2018-09-12: qty 2

## 2018-09-12 MED ORDER — LIDOCAINE HCL (PF) 1 % IJ SOLN
INTRAMUSCULAR | Status: AC
  Filled 2018-09-12: qty 30

## 2018-09-12 MED ORDER — LIDOCAINE HCL (PF) 1 % IJ SOLN
INTRAMUSCULAR | Status: DC | PRN
  Administered 2018-09-12: 14 mL

## 2018-09-12 MED ORDER — LABETALOL HCL 5 MG/ML IV SOLN: 10.0000 mg | INTRAVENOUS | Status: DC | PRN

## 2018-09-12 MED ORDER — IOHEXOL 350 MG/ML SOLN
INTRAVENOUS | Status: DC | PRN
  Administered 2018-09-12: 135 mL via INTRA_ARTERIAL

## 2018-09-12 MED ORDER — ACETAMINOPHEN 325 MG PO TABS: 650.0000 mg | ORAL_TABLET | ORAL | Status: DC | PRN

## 2018-09-12 MED ORDER — ATORVASTATIN CALCIUM 80 MG PO TABS: 80.0000 mg | ORAL_TABLET | Freq: Every day | ORAL | Status: DC

## 2018-09-12 MED ORDER — SODIUM CHLORIDE 0.9 % IV SOLN: INTRAVENOUS | Status: DC

## 2018-09-12 MED ORDER — HEPARIN (PORCINE) IN NACL 1000-0.9 UT/500ML-% IV SOLN
INTRAVENOUS | Status: DC | PRN
  Administered 2018-09-12 (×2): 500 mL

## 2018-09-12 MED ORDER — ONDANSETRON HCL 4 MG/2ML IJ SOLN: 4.0000 mg | Freq: Four times a day (QID) | INTRAMUSCULAR | Status: DC | PRN

## 2018-09-12 MED ORDER — HEPARIN (PORCINE) IN NACL 1000-0.9 UT/500ML-% IV SOLN
INTRAVENOUS | Status: AC
  Filled 2018-09-12: qty 1000

## 2018-09-12 MED ORDER — SODIUM CHLORIDE 0.9 % IV SOLN: 250.0000 mL | INTRAVENOUS | Status: DC | PRN

## 2018-09-12 MED ORDER — ASPIRIN 81 MG PO CHEW
81.0000 mg | CHEWABLE_TABLET | ORAL | Status: AC
  Administered 2018-09-12: 81 mg via ORAL
  Filled 2018-09-12: qty 1

## 2018-09-12 MED ORDER — ISOSORBIDE MONONITRATE ER 60 MG PO TB24: 60.0000 mg | ORAL_TABLET | Freq: Every evening | ORAL | 6 refills | Status: DC

## 2018-09-12 MED ORDER — FENTANYL CITRATE (PF) 100 MCG/2ML IJ SOLN
INTRAMUSCULAR | Status: AC
  Filled 2018-09-12: qty 2

## 2018-09-12 SURGICAL SUPPLY — 14 items
CATH INFINITI 5 FR AR1 MOD (CATHETERS) ×1 IMPLANT
CATH INFINITI 5 FR IM (CATHETERS) ×1 IMPLANT
CATH INFINITI 5 FR LCB (CATHETERS) ×1 IMPLANT
CATH INFINITI 5 FR RCB (CATHETERS) ×1 IMPLANT
CATH INFINITI 5FR MULTPACK ANG (CATHETERS) ×1 IMPLANT
COVER DOME SNAP 22 D (MISCELLANEOUS) ×1 IMPLANT
KIT HEART LEFT (KITS) ×2 IMPLANT
PACK CARDIAC CATHETERIZATION (CUSTOM PROCEDURE TRAY) ×2 IMPLANT
SHEATH PINNACLE 5F 10CM (SHEATH) ×1 IMPLANT
SYR MEDRAD MARK 7 150ML (SYRINGE) ×1 IMPLANT
TRANSDUCER W/STOPCOCK (MISCELLANEOUS) ×2 IMPLANT
TUBING CIL FLEX 10 FLL-RA (TUBING) ×2 IMPLANT
WIRE EMERALD 3MM-J .035X150CM (WIRE) ×1 IMPLANT
WIRE EMERALD 3MM-J .035X260CM (WIRE) ×1 IMPLANT

## 2018-09-12 NOTE — Progress Notes (Signed)
D/c instructions given to daughter, Kris Hartmann, via telephone due to [place] restrictions.  All questions answered and Judson Roch verbalized understanding of instructions

## 2018-09-12 NOTE — Discharge Instructions (Signed)
Femoral Site Care °This sheet gives you information about how to care for yourself after your procedure. Your health care provider may also give you more specific instructions. If you have problems or questions, contact your health care provider. °What can I expect after the procedure? °After the procedure, it is common to have: °· Bruising that usually fades within 1-2 weeks. °· Tenderness at the site. °Follow these instructions at home: °Wound care °· Follow instructions from your health care provider about how to take care of your insertion site. Make sure you: °? Wash your hands with soap and water before you change your bandage (dressing). If soap and water are not available, use hand sanitizer. °? Change your dressing as told by your health care provider. °? Leave stitches (sutures), skin glue, or adhesive strips in place. These skin closures may need to stay in place for 2 weeks or longer. If adhesive strip edges start to loosen and curl up, you may trim the loose edges. Do not remove adhesive strips completely unless your health care provider tells you to do that. °· Do not take baths, swim, or use a hot tub until your health care provider approves. °· You may shower 24-48 hours after the procedure or as told by your health care provider. °? Gently wash the site with plain soap and water. °? Pat the area dry with a clean towel. °? Do not rub the site. This may cause bleeding. °· Do not apply powder or lotion to the site. Keep the site clean and dry. °· Check your femoral site every day for signs of infection. Check for: °? Redness, swelling, or pain. °? Fluid or blood. °? Warmth. °? Pus or a bad smell. °Activity °· For the first 2-3 days after your procedure, or as long as directed: °? Avoid climbing stairs as much as possible. °? Do not squat. °· Do not lift anything that is heavier than 10 lb (4.5 kg), or the limit that you are told, until your health care provider says that it is safe. °· Rest as  directed. °? Avoid sitting for a long time without moving. Get up to take short walks every 1-2 hours. °· Do not drive for 24 hours if you were given a medicine to help you relax (sedative). °General instructions °· Take over-the-counter and prescription medicines only as told by your health care provider. °· Keep all follow-up visits as told by your health care provider. This is important. °Contact a health care provider if you have: °· A fever or chills. °· You have redness, swelling, or pain around your insertion site. °Get help right away if: °· The catheter insertion area swells very fast. °· You pass out. °· You suddenly start to sweat or your skin gets clammy. °· The catheter insertion area is bleeding, and the bleeding does not stop when you hold steady pressure on the area. °· The area near or just beyond the catheter insertion site becomes pale, cool, tingly, or numb. °These symptoms may represent a serious problem that is an emergency. Do not wait to see if the symptoms will go away. Get medical help right away. Call your local emergency services (911 in the [place] Do not drive yourself to the hospital. °Summary °· After the procedure, it is common to have bruising that usually fades within 1-2 weeks. °· Check your femoral site every day for signs of infection. °· Do not lift anything that is heavier than 10 lb (4.5 kg), or the   limit that you are told, until your health care provider says that it is safe. °This information is not intended to replace advice given to you by your health care provider. Make sure you discuss any questions you have with your health care provider. °Document Released: 01/01/2014 Document Revised: 05/13/2017 Document Reviewed: 05/13/2017 °Elsevier Interactive Patient Education © 2019 [employer] ° °

## 2018-09-12 NOTE — Progress Notes (Signed)
Site area: Right groin a 5 french arterial sheath was removed by Diamantina Providence RN  Site Prior to Removal:  Level 0  Pressure Applied For 20 MINUTES    Bedrest Beginning at  1100am  Manual:   Yes.    Patient Status During Pull:  stable  Post Pull Groin Site:  Level 0  Post Pull Instructions Given:  Yes.    Post Pull Pulses Present:  Yes.    Dressing Applied:  Yes.    Comments:  VS remain stable

## 2018-09-12 NOTE — Progress Notes (Signed)
No bleeding or hematoma noted after ambulation 

## 2018-09-12 NOTE — Interval H&P Note (Signed)
Cath Lab Visit (complete for each Cath Lab visit)  Clinical Evaluation Leading to the Procedure:   ACS: No.  Non-ACS:    Anginal Classification: CCS III  Anti-ischemic medical therapy: Maximal Therapy (2 or more classes of medications)  Non-Invasive Test Results: No non-invasive testing performed  Prior CABG: Previous CABG      History and Physical Interval Note:  09/12/2018 9:16 AM  Eric Beltran.  has presented today for surgery, with the diagnosis of chest pain.  The various methods of treatment have been discussed with the patient and family. After consideration of risks, benefits and other options for treatment, the patient has consented to  Procedure(s): LEFT HEART CATH AND CORS/GRAFTS ANGIOGRAPHY (N/A) as a surgical intervention.  The patient's history has been reviewed, patient examined, no change in status, stable for surgery.  I have reviewed the patient's chart and labs.  Questions were answered to the patient's satisfaction.     Shelva Majestic

## 2018-09-15 ENCOUNTER — Encounter (HOSPITAL_COMMUNITY): Payer: Self-pay | Admitting: Cardiovascular Disease

## 2018-09-15 ENCOUNTER — Telehealth: Payer: Self-pay | Admitting: Cardiology

## 2018-09-15 NOTE — Telephone Encounter (Signed)
I will forward request to [place]

## 2018-09-15 NOTE — Telephone Encounter (Signed)
Patient is requesting Dr.McDowell look at his cath results and discuss options. / tg

## 2018-09-16 ENCOUNTER — Telehealth: Payer: Self-pay

## 2018-09-16 NOTE — Telephone Encounter (Signed)

## 2018-09-16 NOTE — Telephone Encounter (Signed)
I reviewed the report. He has graft disease and the question is feasibility of PCI to un-grafted native vessel territories. I need to get feedback from our interventional team (Dr. Evette Georges note indicates that he was going to review with associates). I see that medication adjustment were made after procedure. Please make sure that he has telehealth visit scheduled with me already (not Dr. Harl Bowie as indicated in the cath report).

## 2018-09-16 NOTE — Telephone Encounter (Signed)
Patient has TELEPHONE  televisit on 6/4 at 1:40 pm with Dr.McDowell

## 2018-10-03 ENCOUNTER — Other Ambulatory Visit: Payer: Self-pay | Admitting: Cardiology

## 2018-10-03 NOTE — Telephone Encounter (Signed)
#  90 atorvastatin sent on 09/04/18 to [facility]

## 2018-10-03 NOTE — Telephone Encounter (Signed)
° ° ° °  1. Which medications need to be refilled? (please list name of each medication and dose if known)   atorvastatin (LIPITOR) 40 MG tablet [[id number]]   2. Which pharmacy/location (including street and city if local pharmacy) is medication to be sent to?  [facility]  3. Do they need a 30 day or 90 day supply?   90 day

## 2018-10-07 ENCOUNTER — Telehealth: Payer: Self-pay | Admitting: Cardiology

## 2018-10-07 MED ORDER — LISINOPRIL 10 MG PO TABS: 10.0000 mg | ORAL_TABLET | Freq: Every day | ORAL | 6 refills | Status: DC

## 2018-10-07 NOTE — Telephone Encounter (Signed)
°*  STAT* If patient is at the pharmacy, call can be transferred to refill team.   1. Which medications need to be refilled? (please list name of each medication and dose if known)   Lisinopril tab 10mg   I don't see it on the pt's med list  2. Which pharmacy/location (including street and city if local pharmacy) is medication to be sent to?  [facility]  3. Do they need a 30 day or 90 day supply?

## 2018-10-07 NOTE — Telephone Encounter (Signed)
Called to confirm lisinopril 10 mg being filled consistently. Per [facility] lisinopril has been filled on a regular basis and last filled 09/09/2018 #30. Refill sent to pharmacy.

## 2018-10-10 DIAGNOSIS — I10 Essential (primary) hypertension: Secondary | ICD-10-CM | POA: Diagnosis not present

## 2018-10-10 DIAGNOSIS — I2510 Atherosclerotic heart disease of native coronary artery without angina pectoris: Secondary | ICD-10-CM | POA: Diagnosis not present

## 2018-10-10 DIAGNOSIS — N401 Enlarged prostate with lower urinary tract symptoms: Secondary | ICD-10-CM | POA: Diagnosis not present

## 2018-10-16 ENCOUNTER — Encounter: Payer: Self-pay | Admitting: Cardiology

## 2018-10-16 ENCOUNTER — Telehealth (INDEPENDENT_AMBULATORY_CARE_PROVIDER_SITE_OTHER): Payer: Medicare Other | Admitting: Cardiology

## 2018-10-16 VITALS — BP 133/63 | HR 62 | Ht 67.0 in | Wt 190.6 lb

## 2018-10-16 DIAGNOSIS — I471 Supraventricular tachycardia: Secondary | ICD-10-CM | POA: Diagnosis not present

## 2018-10-16 DIAGNOSIS — E782 Mixed hyperlipidemia: Secondary | ICD-10-CM

## 2018-10-16 DIAGNOSIS — I10 Essential (primary) hypertension: Secondary | ICD-10-CM

## 2018-10-16 DIAGNOSIS — I25119 Atherosclerotic heart disease of native coronary artery with unspecified angina pectoris: Secondary | ICD-10-CM

## 2018-10-16 MED ORDER — ISOSORBIDE MONONITRATE ER 60 MG PO TB24: 30.0000 mg | ORAL_TABLET | ORAL | 6 refills | Status: DC

## 2018-10-16 NOTE — Patient Instructions (Addendum)
Medication Instructions:   Your physician has recommended you make the following change in your medication:   Increase isosorbide mononitrate to 30 mg in the morning and 60 mg in the evening  Continue all other medications the same  Labwork:  NONE  Testing/Procedures:  NONE  Follow-Up:  Your physician recommends that you schedule a follow-up appointment in: 1 month office visit.  Any Other Special Instructions Will Be Listed Below (If Applicable).  If you need a refill on your cardiac medications before your next appointment, please call your pharmacy.

## 2018-10-16 NOTE — Progress Notes (Signed)
Virtual Visit via Telephone Note   This visit type was conducted due to national recommendations for restrictions regarding the COVID-19 Pandemic (e.g. social distancing) in an effort to limit this patient's exposure and mitigate transmission in our community.  Due to his co-morbid illnesses, this patient is at least at moderate risk for complications without adequate follow up.  This format is felt to be most appropriate for this patient at this time.  The patient did not have access to video technology/had technical difficulties with video requiring transitioning to audio format only (telephone).  All issues noted in this document were discussed and addressed.  No physical exam could be performed with this format.  Please refer to the patient's chart for his  consent to telehealth for [facility].   Date:  10/16/2018   ID:  [name], DOB [birth year], MRN [id number]  Patient Location: Home Provider Location: Office  PCP:  Sinda Du, MD  Cardiologist:  Rozann Lesches, MD Electrophysiologist:  None   Evaluation Performed:  Follow-Up Visit  Chief Complaint:   Cardiac follow-up  History of Present Illness:    Eric Beltran. is a 78 y.o. male last seen in April and referred for a diagnostic cardiac catheterization with progressive angina symptoms.   Cardiac catheterization was performed by Dr. Claiborne Billings on May 1 and showed patent LIMA to LAD, patent radial to diagonal, and occluded sequential SVG to the OM, PDA, and PLA 2.  Ostial circumflex occluded at stent site, RCA showed fairly severe calcified disease as detailed below.  Medical therapy was modified by Dr. Claiborne Billings with question remaining as to whether complex PCI would be pursued at a later date.  We could not establish video access today and spoke by phone.  His daughter Judson Roch was also on another line.  I discussed the results of his cardiac catheterization, also reviewed his medications in detail.  He tells me that he  still has angina symptoms, but they have been somewhat less frequent.  I talked with him about potential further up titration of medical therapy, also discussed a walking plan and hopefully getting him back to maintenance cardiac rehabilitation when it opens.  Possibility of PCI to the RCA still remains, however I suspect this would not be straightforward.  I will communicate further with Dr. Claiborne Billings about this.  The patient does not have symptoms concerning for COVID-19 infection (fever, chills, cough, or new shortness of breath).    Past Medical History:  Diagnosis Date  . Arthritis   . Ascending aortic aneurysm [facility])    Status post repair 12/2011 at [facility]  . BPH (benign prostatic hyperplasia)   . Chronic diastolic CHF (congestive heart failure) ([place])   . Coronary atherosclerosis of native coronary artery    a. Multivessel status post prior stenting and ultimately CABG 12/2011 at [facility] with with LIMA to LAD, SVG to PLB, SVG to ramus, SVG to OM . b. Last cath 06/2014 (following ischemic nuc) -> medical therapy, no feasible way to revascularize LCx territory.  . Essential hypertension   . Mixed hyperlipidemia   . Myocardial infarction ([place])   . Postoperative atrial fibrillation ([place])   . Supraventricular tachycardia [facility])    Past Surgical History:  Procedure Laterality Date  . ASCENDING AORTIC ANEURYSM REPAIR  12/2011 [facility]   26 mm.Dacron graft  . CORONARY ARTERY BYPASS GRAFT  12/2011 [facility]   LIMA to LAD, SVG to PLB, SVG to ramus, SVG to OM  . CORONARY ARTERY BYPASS  GRAFT N/A 08/06/2017   Procedure: REDO CORONARY ARTERY BYPASS GRAFTING (CABG) TIMES FOUR UTILIZING LEFT GREATER SAPHENOUS VEIN HAVESTED ENDOVASCULARLY, LEFT RADIAL ARTERY.  RIGHT AXILLARY CANNULATION;  Surgeon: Gaye Pollack, MD;  Location: [place];  Service: Open Heart Surgery;  Laterality: N/A;  . HERNIA REPAIR    . LEFT HEART CATH AND CORS/GRAFTS ANGIOGRAPHY N/A 07/11/2017   Procedure: LEFT HEART CATH AND CORS/GRAFTS  ANGIOGRAPHY;  Surgeon: Troy Sine, MD;  Location: [place] CV LAB;  Service: Cardiovascular;  Laterality: N/A;  . LEFT HEART CATH AND CORS/GRAFTS ANGIOGRAPHY N/A 09/12/2018   Procedure: LEFT HEART CATH AND CORS/GRAFTS ANGIOGRAPHY;  Surgeon: Troy Sine, MD;  Location: [place] CV LAB;  Service: Cardiovascular;  Laterality: N/A;  . LEFT HEART CATHETERIZATION WITH CORONARY ANGIOGRAM N/A 06/18/2014   Procedure: LEFT HEART CATHETERIZATION WITH CORONARY ANGIOGRAM;  Surgeon: Blane Ohara, MD;  Location: [facility] CATH LAB;  Service: Cardiovascular;  Laterality: N/A;  . RADIAL ARTERY HARVEST Left 08/06/2017   Procedure: RADIAL ARTERY HARVEST;  Surgeon: Gaye Pollack, MD;  Location: [place];  Service: Open Heart Surgery;  Laterality: Left;  . TEE WITHOUT CARDIOVERSION N/A 08/06/2017   Procedure: TRANSESOPHAGEAL ECHOCARDIOGRAM (TEE);  Surgeon: Gaye Pollack, MD;  Location: [place];  Service: Open Heart Surgery;  Laterality: N/A;  . TOTAL HIP ARTHROPLASTY Right 02/07/2016   Procedure: RIGHT TOTAL HIP ARTHROPLASTY ANTERIOR APPROACH;  Surgeon: Mcarthur Rossetti, MD;  Location: [place];  Service: Orthopedics;  Laterality: Right;  . TRANSURETHRAL RESECTION OF PROSTATE       Current Meds  Medication Sig  . amiodarone (PACERONE) 200 MG tablet TAKE 1/2 TABLET BY MOUTH DAILY (CUT IN HALF FOR PATIENT)  . aspirin 325 MG tablet Take 325 mg by mouth daily.  [name] atorvastatin (LIPITOR) 40 MG tablet Take 1 tablet (40 mg total) by mouth at bedtime.  [name] diltiazem (CARDIZEM CD) 240 MG 24 hr capsule Take 240 mg by mouth daily.  . finasteride (PROSCAR) 5 MG tablet Take 5 mg by mouth daily.  . furosemide (LASIX) 20 MG tablet TAKE TWO TABLETS BY MOUTH DAILY  . isosorbide mononitrate (IMDUR) 60 MG 24 hr tablet Take 0.5 tablets (30 mg total) by mouth every morning for 30 days. & 60 mg in the evening  . metoprolol tartrate (LOPRESSOR) 25 MG tablet TAKE 1 TABLET BY MOUTH TWICE DAILY  . Multiple Vitamin (MULTIVITAMIN)  tablet Take 1 tablet by mouth at bedtime.   . nitroGLYCERIN (NITROSTAT) 0.4 MG SL tablet PLACE 1 TABLET UNDER TONGUE EVERY 5 MINUTES AS NEEDED FOR CHEST PAIN (Patient taking differently: Place 0.4 mg under the tongue every 5 (five) minutes as needed for chest pain. )  . potassium chloride SA (K-DUR) 20 MEQ tablet Take 20 mEq by mouth daily.  . Tamsulosin HCl (FLOMAX) 0.4 MG CAPS Take 0.4 mg by mouth daily.   . [DISCONTINUED] isosorbide mononitrate (IMDUR) 60 MG 24 hr tablet Take 1 tablet (60 mg total) by mouth every evening for 30 days.  . [DISCONTINUED] potassium chloride SA (K-DUR,KLOR-CON) 20 MEQ tablet Take 1 tablet (20 mEq total) by mouth 2 (two) times daily. (Patient taking differently: Take 20 mEq by mouth daily. )     Allergies:   Patient has no known allergies.   Social History   Tobacco Use  . Smoking status: Never Smoker  . Smokeless tobacco: Never Used  . Tobacco comment: tobacco use - no  Substance Use Topics  . Alcohol use: Yes  Alcohol/week: 0.0 standard drinks    Comment: Occasional  . Drug use: No     Family Hx: The patient's family history includes Hypertension in his father and mother; Lung cancer in his sister; Parkinson's disease in his father.  ROS:   Please see the history of present illness. All other systems reviewed and are negative.   Prior CV studies:   The following studies were reviewed today:  Cardiac catheterization 09/12/2018:  Prox LAD-1 lesion is 80% stenosed.  Prox LAD-2 lesion is 70% stenosed.  Ost Cx to Prox Cx lesion is 100% stenosed.  Prox RCA to Mid RCA lesion is 30% stenosed.  Mid RCA lesion is 20% stenosed.  Post Atrio lesion is 95% stenosed.  Ost RPDA lesion is 80% stenosed.  1st RPLB lesion is 50% stenosed.  Origin lesion before RPDA is 100% stenosed.  Ost LAD lesion is 30% stenosed.   Severe native CAD with 30% ostial and 80% proximal proximal LAD stenosis before the first septal perforating artery and diagonal  vessel with competitive filling of the mid LAD and mid diagonal vessel by way of the LIMA graft and radial artery graft.    Occluded ostial circumflex stent.  Very large dominant RCA with prior mid stent with 20% intimal hyperplasia, and previously noted 95% rock-like calcification immediately after the takeoff of the PDA vessel with new 80% ostial PDA stenosis, 50% PL1 stenosis, with very large PDA, PL 1 and PL 2 branches.  Patent LIMA to LAD from the initial surgery of 2013.  There are very faint distal collaterals supplying the distal OM branch from the apical portion of the LAD.  Patent radial graft supplying the diagonal vessel from surgery of 2019  Occluded sequential vein graft which had supplied the PDA, PLA 2, and obtuse marginal vessel from 2019 surgery.  Previous occlusions of prior vein grafts from 2013 surgery.  RECOMMENDATION: The patient will be sent home today on an increased medical regimen titration of Imdur to 60 mg.  Consider initiation of DAPT and continuation of therapy indefinitely.  I will review the angiographic findings with colleagues.  Unfortunately the sequential graft supplying his very large dominant RCA is now occluded.  Consider possible elective future complex atherectomy and stenting of distal RCA and PDA.  Recommend early follow-up with Dr. Domenic Polite or Dr. Harl Bowie.   Labs/Other Tests and Data Reviewed:    EKG:  An ECG dated 09/10/2018 was personally reviewed today and demonstrated:  Sinus bradycardia with prolonged PR interval, old anterior infarct pattern.  Recent Labs: 06/25/2018: Magnesium 2.0 09/10/2018: BUN 19; Creatinine, Ser 1.03; Hemoglobin 14.4; Platelets 236; Potassium 4.1; Sodium 140    Wt Readings from Last 3 Encounters:  10/16/18 190 lb 9.6 oz (86.5 kg)  09/12/18 194 lb (88 kg)  09/10/18 196 lb (88.9 kg)     Objective:    Vital Signs:  BP 133/63   Pulse 62   Ht 5\' 7"  (1.702 m)   Wt 190 lb 9.6 oz (86.5 kg)   BMI 29.85 kg/m     Patient spoke in full sentences, not short of breath. No audible wheezing or coughing. Speech pattern normal.  ASSESSMENT & PLAN:    1.  Multivessel CAD status post CABG in 2013 with concurrent repair of ascending aortic aneurysm at [facility].  He underwent redo operation in May 2019 due to progressive angina and graft failure with placement of radial to diagonal, SVG to OM, and SVG to PDA and PL 2.  Unfortunately,  he has had further graft failure including revascularization of the OM and RCA distribution.  LIMA and radial to LAD and diagonal respectively are patent.  Plan at this time is to increase Imdur to 30 mg in the morning and 60 mg in the evening, continue aspirin, Lipitor, Cardizem CD, Lopressor, and as needed nitroglycerin.  Depending on blood pressure trend and angina control, we might be able to further uptitrate Imdur or potentially add back low-dose lisinopril.  Question of complex PCI to the RCA remains and I will communicate with Dr. Claiborne Billings about this.  Otherwise we discussed a walking plan and referral back to maintenance cardiac rehabilitation once this program is open.  2.  History of postoperative atrial fibrillation as well as PSVT.  He has had no obvious recurrences and remains on combination of low-dose amiodarone, Cardizem CD, and Lopressor.  He is not anticoagulated.  3.  Essential hypertension.  Medication adjustments being made as noted above.  4.  Mixed hyperlipidemia, continues on Lipitor.  COVID-19 Education: The signs and symptoms of COVID-19 were discussed with the patient and how to seek care for testing (follow up with PCP or arrange E-visit).  The importance of social distancing was discussed today.  Time:   Today, I have spent 20 minutes with the patient with telehealth technology discussing the above problems.     Medication Adjustments/Labs and Tests Ordered: Current medicines are reviewed at length with the patient today.  Concerns regarding  medicines are outlined above.   Tests Ordered: No orders of the defined types were placed in this encounter.   Medication Changes: Meds ordered this encounter  Medications  . isosorbide mononitrate (IMDUR) 60 MG 24 hr tablet    Sig: Take 0.5 tablets (30 mg total) by mouth every morning for 30 days. & 60 mg in the evening    Dispense:  45 tablet    Refill:  6    10/16/2018 dose increase    Disposition:  Follow up 1 month in the office.  Signed, Rozann Lesches, MD  10/16/2018 2:48 PM    [place]

## 2018-10-21 ENCOUNTER — Other Ambulatory Visit: Payer: Self-pay | Admitting: Cardiology

## 2018-10-21 NOTE — Telephone Encounter (Signed)
Patient notified.  Stated that he thought he would have 2 different bottles.  Explained that he will have a 60mg  tablet to do 1/2 tab in the morning & the whole tab in the evening.  He verbalized understanding.

## 2018-10-21 NOTE — Telephone Encounter (Signed)
Patient called stating that [facility] states they have not received RX for  Imdur 60 mg ---Take 0.5 tablets (30 mg total) by mouth every morning for 30 days. & 60 mg in the evening.

## 2018-10-21 NOTE — Telephone Encounter (Signed)
Call placed to [facility] - they do have his refill there that was sent in on 10/16/2018.  They had just filled it on 10/09/18 so insurance was saying too soon to fill.  Per pharm, should use up what he has currently first & will be able to get this new fill on 10/24/2018.    Left message to return call to patient.

## 2018-11-03 ENCOUNTER — Other Ambulatory Visit: Payer: Self-pay | Admitting: Cardiology

## 2018-11-03 MED ORDER — DILTIAZEM HCL ER COATED BEADS 240 MG PO CP24: 240.0000 mg | ORAL_CAPSULE | Freq: Every day | ORAL | 1 refills | Status: DC

## 2018-11-03 NOTE — Telephone Encounter (Signed)
Medication sent to pharmacy  

## 2018-11-03 NOTE — Telephone Encounter (Signed)
°*  STAT* If patient is at the pharmacy, call can be transferred to refill team.   1. Which medications need to be refilled? diltiazem (CARDIZEM CD) 240 MG 24 hr capsule   2. Which pharmacy/location (including street and city if local pharmacy) is medication to be sent to? [facility]  3. Do they need a 30 day or 90 day supply?

## 2018-11-11 ENCOUNTER — Ambulatory Visit (INDEPENDENT_AMBULATORY_CARE_PROVIDER_SITE_OTHER): Payer: Medicare Other | Admitting: Cardiology

## 2018-11-11 ENCOUNTER — Other Ambulatory Visit: Payer: Self-pay

## 2018-11-11 ENCOUNTER — Encounter: Payer: Self-pay | Admitting: Cardiology

## 2018-11-11 VITALS — BP 156/75 | HR 58 | Temp 99.1°F | Wt 187.0 lb

## 2018-11-11 DIAGNOSIS — I471 Supraventricular tachycardia: Secondary | ICD-10-CM

## 2018-11-11 DIAGNOSIS — I25119 Atherosclerotic heart disease of native coronary artery with unspecified angina pectoris: Secondary | ICD-10-CM

## 2018-11-11 DIAGNOSIS — E782 Mixed hyperlipidemia: Secondary | ICD-10-CM

## 2018-11-11 DIAGNOSIS — I10 Essential (primary) hypertension: Secondary | ICD-10-CM | POA: Diagnosis not present

## 2018-11-11 MED ORDER — ISOSORBIDE MONONITRATE ER 60 MG PO TB24: ORAL_TABLET | ORAL | 3 refills | Status: DC

## 2018-11-11 NOTE — Progress Notes (Signed)
Cardiology Office Note  Date: 11/11/2018   ID: Eric Thornhill., DOB [birth year], MRN [id number]  PCP:  Sinda Du, MD  Cardiologist:  Rozann Lesches, MD Electrophysiologist:  None   Chief Complaint  Patient presents with  . Coronary Artery Disease    History of Present Illness: Eric Luu. is a 78 y.o. male most recently evaluated with telehealth encounter in early June.  He presents for a follow-up visit.  States that he has had somewhat better control of angina symptoms.  He did tolerate the increase in Imdur that we made at the last visit.  Cardiac catheterization was performed by Dr. Claiborne Billings on May 1 and showed patent LIMA to LAD, patent radial to diagonal, and occluded sequential SVG to the OM, PDA, and PLA 2.  Ostial circumflex occluded at stent site, RCA showed fairly severe calcified disease as detailed below.  Medical therapy was modified by Dr. Claiborne Billings with question remaining as to whether complex PCI would be pursued at a later date.  He is also working on diet, has lost about 10 pounds.  I congratulated him.  Past Medical History:  Diagnosis Date  . Arthritis   . Ascending aortic aneurysm [facility])    Status post repair 12/2011 at [facility]  . BPH (benign prostatic hyperplasia)   . Chronic diastolic CHF (congestive heart failure) ([place])   . Coronary atherosclerosis of native coronary artery    a. Multivessel status post prior stenting and ultimately CABG 12/2011 at [facility] with with LIMA to LAD, SVG to PLB, SVG to ramus, SVG to OM . b. Last cath 06/2014 (following ischemic nuc) -> medical therapy, no feasible way to revascularize LCx territory.  . Essential hypertension   . Mixed hyperlipidemia   . Myocardial infarction ([place])   . Postoperative atrial fibrillation ([place])   . Supraventricular tachycardia [facility])     Past Surgical History:  Procedure Laterality Date  . ASCENDING AORTIC ANEURYSM REPAIR  12/2011 [facility]   26 mm.Dacron graft  . CORONARY ARTERY BYPASS  GRAFT  12/2011 [facility]   LIMA to LAD, SVG to PLB, SVG to ramus, SVG to OM  . CORONARY ARTERY BYPASS GRAFT N/A 08/06/2017   Procedure: REDO CORONARY ARTERY BYPASS GRAFTING (CABG) TIMES FOUR UTILIZING LEFT GREATER SAPHENOUS VEIN HAVESTED ENDOVASCULARLY, LEFT RADIAL ARTERY.  RIGHT AXILLARY CANNULATION;  Surgeon: Gaye Pollack, MD;  Location: [place];  Service: Open Heart Surgery;  Laterality: N/A;  . HERNIA REPAIR    . LEFT HEART CATH AND CORS/GRAFTS ANGIOGRAPHY N/A 07/11/2017   Procedure: LEFT HEART CATH AND CORS/GRAFTS ANGIOGRAPHY;  Surgeon: Troy Sine, MD;  Location: [place] CV LAB;  Service: Cardiovascular;  Laterality: N/A;  . LEFT HEART CATH AND CORS/GRAFTS ANGIOGRAPHY N/A 09/12/2018   Procedure: LEFT HEART CATH AND CORS/GRAFTS ANGIOGRAPHY;  Surgeon: Troy Sine, MD;  Location: [place] CV LAB;  Service: Cardiovascular;  Laterality: N/A;  . LEFT HEART CATHETERIZATION WITH CORONARY ANGIOGRAM N/A 06/18/2014   Procedure: LEFT HEART CATHETERIZATION WITH CORONARY ANGIOGRAM;  Surgeon: Blane Ohara, MD;  Location: [facility] CATH LAB;  Service: Cardiovascular;  Laterality: N/A;  . RADIAL ARTERY HARVEST Left 08/06/2017   Procedure: RADIAL ARTERY HARVEST;  Surgeon: Gaye Pollack, MD;  Location: [place];  Service: Open Heart Surgery;  Laterality: Left;  . TEE WITHOUT CARDIOVERSION N/A 08/06/2017   Procedure: TRANSESOPHAGEAL ECHOCARDIOGRAM (TEE);  Surgeon: Gaye Pollack, MD;  Location: [place];  Service: Open Heart Surgery;  Laterality: N/A;  . TOTAL  HIP ARTHROPLASTY Right 02/07/2016   Procedure: RIGHT TOTAL HIP ARTHROPLASTY ANTERIOR APPROACH;  Surgeon: Mcarthur Rossetti, MD;  Location: [place];  Service: Orthopedics;  Laterality: Right;  . TRANSURETHRAL RESECTION OF PROSTATE      Current Outpatient Medications  Medication Sig Dispense Refill  . amiodarone (PACERONE) 200 MG tablet TAKE 1/2 TABLET BY MOUTH DAILY (CUT IN HALF FOR PATIENT) 45 tablet 6  . aspirin 325 MG tablet Take 325 mg by mouth  daily.    [name] atorvastatin (LIPITOR) 40 MG tablet Take 1 tablet (40 mg total) by mouth at bedtime. 90 tablet 3  . diltiazem (CARDIZEM CD) 240 MG 24 hr capsule Take 1 capsule (240 mg total) by mouth daily. 90 capsule 1  . finasteride (PROSCAR) 5 MG tablet Take 5 mg by mouth daily.    . furosemide (LASIX) 20 MG tablet TAKE TWO TABLETS BY MOUTH DAILY 180 tablet 3  . isosorbide mononitrate (IMDUR) 60 MG 24 hr tablet Take 60 mg in the AM & 60 mg in the PM. 60 tablet 3  . metoprolol tartrate (LOPRESSOR) 25 MG tablet TAKE 1 TABLET BY MOUTH TWICE DAILY 180 tablet 2  . Multiple Vitamin (MULTIVITAMIN) tablet Take 1 tablet by mouth at bedtime.     . nitroGLYCERIN (NITROSTAT) 0.4 MG SL tablet PLACE 1 TABLET UNDER TONGUE EVERY 5 MINUTES AS NEEDED FOR CHEST PAIN (Patient taking differently: Place 0.4 mg under the tongue every 5 (five) minutes as needed for chest pain. ) 25 tablet 3  . potassium chloride SA (K-DUR) 20 MEQ tablet Take 20 mEq by mouth daily.    . Tamsulosin HCl (FLOMAX) 0.4 MG CAPS Take 0.4 mg by mouth daily.      No current facility-administered medications for this visit.    Allergies:  Patient has no known allergies.   Social History: The patient  reports that he has never smoked. He has never used smokeless tobacco. He reports current alcohol use. He reports that he does not use drugs.   ROS:  Please see the history of present illness. Otherwise, complete review of systems is positive for none.  All other systems are reviewed and negative.   Physical Exam: VS:  BP (!) 156/75 (BP Location: Left Arm)   Pulse (!) 58   Temp 99.1 F (37.3 C)   Wt 187 lb (84.8 kg)   SpO2 97%   BMI 29.29 kg/m , BMI Body mass index is 29.29 kg/m.  Wt Readings from Last 3 Encounters:  11/11/18 187 lb (84.8 kg)  10/16/18 190 lb 9.6 oz (86.5 kg)  09/12/18 194 lb (88 kg)    General: Patient appears comfortable at rest. HEENT: Conjunctiva and lids normal, oropharynx clear. Neck: Supple, no elevated JVP or  carotid bruits, no thyromegaly. Lungs: Clear to auscultation, nonlabored breathing at rest. Cardiac: Regular rate and rhythm, no S3, 2/6 systolic murmur, no pericardial rub. Abdomen: Protuberant, nontender, bowel sounds present, no guarding or rebound. Extremities: No pitting edema, distal pulses 2+. Skin: Warm and dry. Musculoskeletal: No kyphosis. Neuropsychiatric: Alert and oriented x3, affect grossly appropriate.  ECG:  An ECG dated 09/10/2018 was personally reviewed today and demonstrated:  Sinus bradycardia with prolonged PR interval, old anterior infarct pattern.  Recent Labwork: 06/25/2018: Magnesium 2.0 09/10/2018: BUN 19; Creatinine, Ser 1.03; Hemoglobin 14.4; Platelets 236; Potassium 4.1; Sodium 140     Component Value Date/Time   CHOL 130 03/06/2017 0610   TRIG 262 (H) 03/06/2017 0610   HDL 28 (L) 03/06/2017 1962  CHOLHDL 4.6 03/06/2017 0610   VLDL 52 (H) 03/06/2017 0610   LDLCALC 50 03/06/2017 0610    Other Studies Reviewed Today:  Cardiac catheterization 09/12/2018:  Prox LAD-1 lesion is 80% stenosed.  Prox LAD-2 lesion is 70% stenosed.  Ost Cx to Prox Cx lesion is 100% stenosed.  Prox RCA to Mid RCA lesion is 30% stenosed.  Mid RCA lesion is 20% stenosed.  Post Atrio lesion is 95% stenosed.  Ost RPDA lesion is 80% stenosed.  1st RPLB lesion is 50% stenosed.  Origin lesion before RPDA is 100% stenosed.  Ost LAD lesion is 30% stenosed.  Severe native CAD with 30% ostial and 80% proximal proximal LAD stenosis before the first septal perforating artery and diagonal vessel with competitive filling of the mid LAD and mid diagonal vessel by way of the LIMA graft and radial artery graft.   Occluded ostial circumflex stent.  Very large dominant RCA with prior mid stent with 20% intimal hyperplasia, and previously noted 95% rock-like calcification immediately after the takeoff of the PDA vessel with new 80% ostial PDA stenosis, 50% PL1 stenosis, with very  large PDA, PL 1 and PL 2 branches.  Patent LIMA to LAD from the initial surgery of 2013. There are very faint distal collaterals supplying the distal OM branch from the apical portion of the LAD.  Patent radial graft supplying the diagonal vessel from surgery of 2019  Occluded sequential vein graft which had supplied the PDA, PLA 2, and obtuse marginal vessel from 2019 surgery.  Previous occlusions of prior vein grafts from 2013 surgery.  Assessment and Plan:  1. Multivessel CAD status post CABG in 2013 with concurrent repair of ascending aortic aneurysm at [facility].  He underwent redo operation in May 2019 due to progressive angina and graft failure with placement of radial to diagonal, SVG to OM, and SVG to PDA and PL 2.  Unfortunately, he has had further graft failure including revascularization of the OM and RCA distribution.  LIMA and radial to LAD and diagonal respectively are patent.  Plan is to increase Imdur to 60 mg twice daily, otherwise continue with current regimen as noted above.  I asked Dr. Burt Knack to review the patient's angiographic films to get another opinion regarding whether PCI would be reasonable if symptoms escalate.  2.  PSVT and history of postoperative atrial fibrillation.  He is not anticoagulated at this time but is on low-dose amiodarone along with AV nodal blockers without obvious recurrences.  3.  Essential hypertension.  Blood pressure is up today.  Imdur is being increased.  We could try to add back lisinopril next if needed.  Medication Adjustments/Labs and Tests Ordered: Current medicines are reviewed at length with the patient today.  Concerns regarding medicines are outlined above.   Tests Ordered: No orders of the defined types were placed in this encounter.   Medication Changes: Meds ordered this encounter  Medications  . isosorbide mononitrate (IMDUR) 60 MG 24 hr tablet    Sig: Take 60 mg in the AM & 60 mg in the PM.    Dispense:  60  tablet    Refill:  3    10/16/2018 dose increase    Disposition:  Follow up 3 months.  Signed, Satira Sark, MD, [facility] 11/11/2018 2:28 PM    [facility] [address], [place], [place] [address] Phone: [phone]; Fax: [phone]

## 2018-11-11 NOTE — Patient Instructions (Signed)
Medication Instructions:  INCREASE IMDUR TO 60 MG - TWO TIMES DAILY  Labwork: NONE  Testing/Procedures: NONE  Follow-Up: Your physician recommends that you schedule a follow-up appointment in: 3 MONTHS    Any Other Special Instructions Will Be Listed Below (If Applicable).     If you need a refill on your cardiac medications before your next appointment, please call your pharmacy.

## 2018-11-12 ENCOUNTER — Ambulatory Visit: Admit: 2018-11-12 | Discharge: 2018-11-13 | Payer: MEDICARE

## 2018-11-12 DIAGNOSIS — L118 Other specified acantholytic disorders: Secondary | ICD-10-CM | POA: Diagnosis not present

## 2018-11-12 DIAGNOSIS — L820 Inflamed seborrheic keratosis: Secondary | ICD-10-CM | POA: Diagnosis not present

## 2018-11-12 DIAGNOSIS — D485 Neoplasm of uncertain behavior of skin: Secondary | ICD-10-CM | POA: Diagnosis not present

## 2018-11-12 DIAGNOSIS — L639 Alopecia areata, unspecified: Secondary | ICD-10-CM

## 2018-11-12 DIAGNOSIS — N2889 Other specified disorders of kidney and ureter: Principal | ICD-10-CM

## 2018-11-12 DIAGNOSIS — L821 Other seborrheic keratosis: Principal | ICD-10-CM

## 2018-11-12 DIAGNOSIS — L814 Other melanin hyperpigmentation: Secondary | ICD-10-CM

## 2018-11-12 DIAGNOSIS — R590 Localized enlarged lymph nodes: Secondary | ICD-10-CM

## 2018-11-12 DIAGNOSIS — Z85828 Personal history of other malignant neoplasm of skin: Secondary | ICD-10-CM

## 2018-11-12 MED ORDER — CLOBETASOL 0.05 % SCALP SOLUTION
Freq: Two times a day (BID) | TOPICAL | 3 refills | 0.00000 days | Status: CP
Start: 2018-11-12 — End: 2019-11-12

## 2018-11-18 ENCOUNTER — Ambulatory Visit: Admit: 2018-11-18 | Discharge: 2018-11-19 | Payer: MEDICARE

## 2018-11-19 ENCOUNTER — Telehealth
Admit: 2018-11-19 | Discharge: 2018-11-20 | Payer: MEDICARE | Attending: Nurse Practitioner | Primary: Nurse Practitioner

## 2018-11-19 DIAGNOSIS — N401 Enlarged prostate with lower urinary tract symptoms: Secondary | ICD-10-CM | POA: Diagnosis not present

## 2018-11-19 DIAGNOSIS — R350 Frequency of micturition: Secondary | ICD-10-CM | POA: Diagnosis not present

## 2018-11-19 DIAGNOSIS — N2889 Other specified disorders of kidney and ureter: Principal | ICD-10-CM

## 2018-11-25 DIAGNOSIS — R221 Localized swelling, mass and lump, neck: Secondary | ICD-10-CM | POA: Diagnosis not present

## 2018-11-25 DIAGNOSIS — R590 Localized enlarged lymph nodes: Principal | ICD-10-CM

## 2018-11-26 ENCOUNTER — Ambulatory Visit
Admit: 2018-11-26 | Discharge: 2018-12-01 | Disposition: A | Discharge status: Home Health | Payer: MEDICARE | Source: Other Acute Inpatient Hospital | Attending: Otolaryngology | Admitting: Vascular Surgery

## 2018-11-26 ENCOUNTER — Ambulatory Visit
Admit: 2018-11-26 | Discharge: 2018-12-01 | Disposition: A | Discharge status: Home Health | Payer: MEDICARE | Source: Other Acute Inpatient Hospital | Admitting: Vascular Surgery

## 2018-11-26 ENCOUNTER — Encounter
Admit: 2018-11-26 | Discharge: 2018-12-01 | Disposition: A | Discharge status: Home Health | Payer: MEDICARE | Source: Other Acute Inpatient Hospital | Admitting: Vascular Surgery

## 2018-11-26 DIAGNOSIS — I771 Stricture of artery: Secondary | ICD-10-CM | POA: Diagnosis not present

## 2018-11-26 DIAGNOSIS — I70211 Atherosclerosis of native arteries of extremities with intermittent claudication, right leg: Secondary | ICD-10-CM | POA: Diagnosis not present

## 2018-11-26 DIAGNOSIS — J189 Pneumonia, unspecified organism: Secondary | ICD-10-CM | POA: Diagnosis not present

## 2018-11-26 DIAGNOSIS — J90 Pleural effusion, not elsewhere classified: Secondary | ICD-10-CM | POA: Diagnosis not present

## 2018-11-26 DIAGNOSIS — M79661 Pain in right lower leg: Secondary | ICD-10-CM | POA: Diagnosis not present

## 2018-11-26 DIAGNOSIS — I998 Other disorder of circulatory system: Secondary | ICD-10-CM | POA: Diagnosis not present

## 2018-11-26 DIAGNOSIS — Z7982 Long term (current) use of aspirin: Secondary | ICD-10-CM | POA: Diagnosis not present

## 2018-11-26 DIAGNOSIS — Z85828 Personal history of other malignant neoplasm of skin: Secondary | ICD-10-CM | POA: Diagnosis not present

## 2018-11-26 DIAGNOSIS — I872 Venous insufficiency (chronic) (peripheral): Secondary | ICD-10-CM | POA: Diagnosis not present

## 2018-11-26 DIAGNOSIS — M7989 Other specified soft tissue disorders: Secondary | ICD-10-CM | POA: Diagnosis not present

## 2018-11-26 DIAGNOSIS — N400 Enlarged prostate without lower urinary tract symptoms: Secondary | ICD-10-CM | POA: Diagnosis not present

## 2018-11-26 DIAGNOSIS — Z79899 Other long term (current) drug therapy: Secondary | ICD-10-CM | POA: Diagnosis not present

## 2018-11-26 DIAGNOSIS — I743 Embolism and thrombosis of arteries of the lower extremities: Secondary | ICD-10-CM | POA: Diagnosis not present

## 2018-11-26 DIAGNOSIS — I252 Old myocardial infarction: Secondary | ICD-10-CM | POA: Diagnosis not present

## 2018-11-26 DIAGNOSIS — M62262 Nontraumatic ischemic infarction of muscle, left lower leg: Secondary | ICD-10-CM | POA: Diagnosis not present

## 2018-11-26 DIAGNOSIS — Z95828 Presence of other vascular implants and grafts: Secondary | ICD-10-CM | POA: Diagnosis not present

## 2018-11-26 DIAGNOSIS — I739 Peripheral vascular disease, unspecified: Secondary | ICD-10-CM | POA: Diagnosis not present

## 2018-11-26 DIAGNOSIS — I10 Essential (primary) hypertension: Secondary | ICD-10-CM | POA: Diagnosis not present

## 2018-11-26 DIAGNOSIS — K769 Liver disease, unspecified: Secondary | ICD-10-CM | POA: Diagnosis not present

## 2018-11-26 DIAGNOSIS — I351 Nonrheumatic aortic (valve) insufficiency: Secondary | ICD-10-CM | POA: Diagnosis not present

## 2018-11-26 DIAGNOSIS — I348 Other nonrheumatic mitral valve disorders: Secondary | ICD-10-CM | POA: Diagnosis not present

## 2018-11-26 DIAGNOSIS — I517 Cardiomegaly: Secondary | ICD-10-CM | POA: Diagnosis not present

## 2018-11-26 DIAGNOSIS — M79604 Pain in right leg: Secondary | ICD-10-CM | POA: Diagnosis not present

## 2018-11-26 DIAGNOSIS — R238 Other skin changes: Secondary | ICD-10-CM | POA: Diagnosis not present

## 2018-11-26 DIAGNOSIS — M79671 Pain in right foot: Secondary | ICD-10-CM | POA: Diagnosis not present

## 2018-11-26 DIAGNOSIS — I4891 Unspecified atrial fibrillation: Secondary | ICD-10-CM | POA: Diagnosis not present

## 2018-11-26 DIAGNOSIS — R918 Other nonspecific abnormal finding of lung field: Secondary | ICD-10-CM | POA: Diagnosis not present

## 2018-11-26 DIAGNOSIS — J9811 Atelectasis: Secondary | ICD-10-CM | POA: Diagnosis not present

## 2018-11-26 DIAGNOSIS — M62261 Nontraumatic ischemic infarction of muscle, right lower leg: Secondary | ICD-10-CM | POA: Diagnosis not present

## 2018-11-26 DIAGNOSIS — I240 Acute coronary thrombosis not resulting in myocardial infarction: Secondary | ICD-10-CM | POA: Diagnosis not present

## 2018-11-26 DIAGNOSIS — E7800 Pure hypercholesterolemia, unspecified: Secondary | ICD-10-CM | POA: Diagnosis not present

## 2018-11-26 DIAGNOSIS — I77819 Aortic ectasia, unspecified site: Secondary | ICD-10-CM | POA: Diagnosis not present

## 2018-11-26 DIAGNOSIS — Z8739 Personal history of other diseases of the musculoskeletal system and connective tissue: Secondary | ICD-10-CM | POA: Diagnosis not present

## 2018-11-26 DIAGNOSIS — I2510 Atherosclerotic heart disease of native coronary artery without angina pectoris: Secondary | ICD-10-CM | POA: Diagnosis not present

## 2018-11-26 DIAGNOSIS — K219 Gastro-esophageal reflux disease without esophagitis: Secondary | ICD-10-CM | POA: Diagnosis not present

## 2018-11-26 DIAGNOSIS — Z1159 Encounter for screening for other viral diseases: Secondary | ICD-10-CM | POA: Diagnosis not present

## 2018-11-26 DIAGNOSIS — I77810 Thoracic aortic ectasia: Secondary | ICD-10-CM | POA: Diagnosis not present

## 2018-11-26 DIAGNOSIS — Z955 Presence of coronary angioplasty implant and graft: Secondary | ICD-10-CM | POA: Diagnosis not present

## 2018-11-26 DIAGNOSIS — R0989 Other specified symptoms and signs involving the circulatory and respiratory systems: Secondary | ICD-10-CM | POA: Diagnosis not present

## 2018-11-26 DIAGNOSIS — Z951 Presence of aortocoronary bypass graft: Secondary | ICD-10-CM | POA: Diagnosis not present

## 2018-11-27 DIAGNOSIS — I70211 Atherosclerosis of native arteries of extremities with intermittent claudication, right leg: Principal | ICD-10-CM

## 2018-11-28 DIAGNOSIS — I70211 Atherosclerosis of native arteries of extremities with intermittent claudication, right leg: Principal | ICD-10-CM

## 2018-11-30 DIAGNOSIS — I70211 Atherosclerosis of native arteries of extremities with intermittent claudication, right leg: Principal | ICD-10-CM

## 2018-12-01 DIAGNOSIS — I70211 Atherosclerosis of native arteries of extremities with intermittent claudication, right leg: Principal | ICD-10-CM

## 2018-12-01 MED ORDER — ACETAMINOPHEN 325 MG TABLET
ORAL | 0 refills | 0.00000 days | PRN
Start: 2018-12-01 — End: ?

## 2018-12-01 MED ORDER — OXYCODONE 5 MG TABLET
ORAL_TABLET | Freq: Four times a day (QID) | ORAL | 0 refills | 3.00000 days | Status: CP | PRN
Start: 2018-12-01 — End: 2018-12-06
  Filled 2018-12-01: qty 10, 2d supply, fill #0

## 2018-12-01 MED ORDER — TAMSULOSIN 0.4 MG CAPSULE
ORAL_CAPSULE | 1 refills | 0 days | Status: CP
Start: 2018-12-01 — End: ?

## 2018-12-01 MED ORDER — APIXABAN 5 MG TABLET
ORAL_TABLET | 0 refills | 0 days | Status: CP
Start: 2018-12-01 — End: ?
  Filled 2018-12-01: qty 68, 29d supply, fill #0

## 2018-12-01 MED ORDER — ASPIRIN 81 MG CHEWABLE TABLET
ORAL_TABLET | Freq: Every day | ORAL | 11 refills | 36.00000 days | Status: CP
Start: 2018-12-01 — End: ?
  Filled 2018-12-01: qty 36, 36d supply, fill #0

## 2018-12-01 MED ORDER — DOCUSATE SODIUM 100 MG CAPSULE
ORAL_CAPSULE | Freq: Every day | ORAL | 0 refills | 30 days
Start: 2018-12-01 — End: 2018-12-31

## 2018-12-01 MED FILL — ASPIRIN 81 MG CHEWABLE TABLET: 36 days supply | Qty: 36 | Fill #0 | Status: AC

## 2018-12-01 MED FILL — ELIQUIS 5 MG TABLET: 29 days supply | Qty: 68 | Fill #0 | Status: AC

## 2018-12-01 MED FILL — OXYCODONE 5 MG TABLET: 2 days supply | Qty: 10 | Fill #0 | Status: AC

## 2018-12-03 DIAGNOSIS — Z9489 Other transplanted organ and tissue status: Secondary | ICD-10-CM | POA: Diagnosis not present

## 2018-12-03 DIAGNOSIS — I70211 Atherosclerosis of native arteries of extremities with intermittent claudication, right leg: Secondary | ICD-10-CM | POA: Diagnosis not present

## 2018-12-03 DIAGNOSIS — Z95828 Presence of other vascular implants and grafts: Secondary | ICD-10-CM | POA: Diagnosis not present

## 2018-12-03 DIAGNOSIS — Z48812 Encounter for surgical aftercare following surgery on the circulatory system: Secondary | ICD-10-CM | POA: Diagnosis not present

## 2018-12-03 DIAGNOSIS — Z4801 Encounter for change or removal of surgical wound dressing: Secondary | ICD-10-CM | POA: Diagnosis not present

## 2018-12-03 DIAGNOSIS — Z9181 History of falling: Secondary | ICD-10-CM | POA: Diagnosis not present

## 2018-12-03 DIAGNOSIS — I2510 Atherosclerotic heart disease of native coronary artery without angina pectoris: Secondary | ICD-10-CM | POA: Diagnosis not present

## 2018-12-03 DIAGNOSIS — I10 Essential (primary) hypertension: Secondary | ICD-10-CM | POA: Diagnosis not present

## 2018-12-03 DIAGNOSIS — Z951 Presence of aortocoronary bypass graft: Secondary | ICD-10-CM | POA: Diagnosis not present

## 2018-12-03 DIAGNOSIS — I4891 Unspecified atrial fibrillation: Secondary | ICD-10-CM | POA: Diagnosis not present

## 2018-12-17 ENCOUNTER — Ambulatory Visit: Admit: 2018-12-17 | Discharge: 2018-12-18 | Payer: MEDICARE

## 2018-12-17 ENCOUNTER — Ambulatory Visit: Admit: 2018-12-17 | Discharge: 2018-12-18 | Payer: MEDICARE | Attending: Vascular Surgery | Primary: Vascular Surgery

## 2018-12-17 DIAGNOSIS — E041 Nontoxic single thyroid nodule: Secondary | ICD-10-CM | POA: Diagnosis not present

## 2018-12-17 DIAGNOSIS — I739 Peripheral vascular disease, unspecified: Secondary | ICD-10-CM | POA: Diagnosis not present

## 2018-12-17 DIAGNOSIS — I998 Other disorder of circulatory system: Secondary | ICD-10-CM | POA: Diagnosis not present

## 2018-12-17 DIAGNOSIS — R590 Localized enlarged lymph nodes: Principal | ICD-10-CM

## 2018-12-19 ENCOUNTER — Ambulatory Visit: Admit: 2018-12-19 | Discharge: 2018-12-20 | Payer: MEDICARE | Attending: Otolaryngology | Primary: Otolaryngology

## 2018-12-19 DIAGNOSIS — R221 Localized swelling, mass and lump, neck: Secondary | ICD-10-CM | POA: Diagnosis not present

## 2018-12-25 ENCOUNTER — Other Ambulatory Visit: Payer: Self-pay

## 2018-12-25 MED ORDER — ATORVASTATIN CALCIUM 40 MG PO TABS: 40.0000 mg | ORAL_TABLET | Freq: Every day | ORAL | 3 refills | Status: DC

## 2018-12-25 MED ORDER — FINASTERIDE 5 MG TABLET
ORAL_TABLET | 12 refills | 0 days | Status: CP
Start: 2018-12-25 — End: ?

## 2018-12-25 NOTE — Telephone Encounter (Signed)
- 

## 2018-12-26 ENCOUNTER — Ambulatory Visit: Admit: 2018-12-26 | Discharge: 2018-12-26 | Payer: MEDICARE | Attending: Otolaryngology | Primary: Otolaryngology

## 2018-12-26 DIAGNOSIS — D234 Other benign neoplasm of skin of scalp and neck: Secondary | ICD-10-CM | POA: Diagnosis not present

## 2018-12-26 DIAGNOSIS — D170 Benign lipomatous neoplasm of skin and subcutaneous tissue of head, face and neck: Secondary | ICD-10-CM | POA: Diagnosis not present

## 2018-12-26 DIAGNOSIS — R221 Localized swelling, mass and lump, neck: Principal | ICD-10-CM

## 2018-12-26 DIAGNOSIS — D179 Benign lipomatous neoplasm, unspecified: Secondary | ICD-10-CM | POA: Diagnosis not present

## 2018-12-26 MED ORDER — CEPHALEXIN 500 MG CAPSULE
ORAL_CAPSULE | Freq: Two times a day (BID) | ORAL | 0 refills | 5.00000 days | Status: CP
Start: 2018-12-26 — End: 2018-12-31

## 2019-01-01 MED ORDER — APIXABAN 5 MG TABLET
ORAL_TABLET | Freq: Two times a day (BID) | ORAL | 1 refills | 30 days | Status: CP
Start: 2019-01-01 — End: ?

## 2019-01-02 ENCOUNTER — Ambulatory Visit
Admit: 2019-01-02 | Discharge: 2019-01-03 | Payer: MEDICARE | Attending: Student in an Organized Health Care Education/Training Program | Primary: Student in an Organized Health Care Education/Training Program

## 2019-01-02 DIAGNOSIS — R221 Localized swelling, mass and lump, neck: Secondary | ICD-10-CM | POA: Diagnosis not present

## 2019-01-02 DIAGNOSIS — Z09 Encounter for follow-up examination after completed treatment for conditions other than malignant neoplasm: Secondary | ICD-10-CM | POA: Diagnosis not present

## 2019-01-05 ENCOUNTER — Telehealth: Payer: Self-pay | Admitting: *Deleted

## 2019-01-05 MED ORDER — APIXABAN 5 MG PO TABS: 5.0000 mg | ORAL_TABLET | Freq: Two times a day (BID) | ORAL | 3 refills | Status: DC

## 2019-01-05 NOTE — Telephone Encounter (Signed)
Go ahead and refill Eliquis.

## 2019-01-05 NOTE — Telephone Encounter (Signed)
Medication sent to pharmacy  

## 2019-01-05 NOTE — Telephone Encounter (Signed)
[facility] called requesting refills of Eliquis 5 mg bid - says pt was seen at [facility] and medication was started there (notes in care everywhere) says pt was discharged with 3 month supply of Eliquis - advised would need to forward to provider for review since we did not start this medication

## 2019-01-08 DIAGNOSIS — N401 Enlarged prostate with lower urinary tract symptoms: Secondary | ICD-10-CM | POA: Diagnosis not present

## 2019-01-08 DIAGNOSIS — E785 Hyperlipidemia, unspecified: Secondary | ICD-10-CM | POA: Diagnosis not present

## 2019-01-08 DIAGNOSIS — I2510 Atherosclerotic heart disease of native coronary artery without angina pectoris: Secondary | ICD-10-CM | POA: Diagnosis not present

## 2019-01-08 DIAGNOSIS — I10 Essential (primary) hypertension: Secondary | ICD-10-CM | POA: Diagnosis not present

## 2019-02-09 ENCOUNTER — Telehealth (INDEPENDENT_AMBULATORY_CARE_PROVIDER_SITE_OTHER): Payer: Medicare Other | Admitting: Cardiology

## 2019-02-09 ENCOUNTER — Other Ambulatory Visit: Payer: Self-pay

## 2019-02-09 ENCOUNTER — Encounter: Payer: Self-pay | Admitting: Cardiology

## 2019-02-09 VITALS — BP 142/66 | HR 54 | Ht 67.0 in | Wt 185.0 lb

## 2019-02-09 DIAGNOSIS — I739 Peripheral vascular disease, unspecified: Secondary | ICD-10-CM | POA: Diagnosis not present

## 2019-02-09 DIAGNOSIS — I25119 Atherosclerotic heart disease of native coronary artery with unspecified angina pectoris: Secondary | ICD-10-CM | POA: Diagnosis not present

## 2019-02-09 DIAGNOSIS — Z79899 Other long term (current) drug therapy: Secondary | ICD-10-CM

## 2019-02-09 DIAGNOSIS — I10 Essential (primary) hypertension: Secondary | ICD-10-CM | POA: Diagnosis not present

## 2019-02-09 DIAGNOSIS — I480 Paroxysmal atrial fibrillation: Secondary | ICD-10-CM

## 2019-02-09 NOTE — Progress Notes (Signed)
Virtual Visit via Telephone Note   This visit type was conducted due to national recommendations for restrictions regarding the COVID-19 Pandemic (e.g. social distancing) in an effort to limit this patient's exposure and mitigate transmission in our community.  Due to his co-morbid illnesses, this patient is at least at moderate risk for complications without adequate follow up.  This format is felt to be most appropriate for this patient at this time.  The patient did not have access to video technology/had technical difficulties with video requiring transitioning to audio format only (telephone).  All issues noted in this document were discussed and addressed.  No physical exam could be performed with this format.  Please refer to the patient's chart for his  consent to telehealth for [facility].   Date:  02/09/2019   ID:  Eric Beltran., DOB [birth year], MRN [id number]  Patient Location: Home Provider Location: Home  PCP:  Sinda Du, MD  Cardiologist:  Rozann Lesches, MD Electrophysiologist:  None   Evaluation Performed:  Follow-Up Visit  Chief Complaint:   Cardiac follow-up  History of Present Illness:    Eric Beltran. is a 78 y.o. male last seen in June.  We spoke by phone today.  I reviewed interval records, he was hospitalized in July at [facility] with acute on chronic limb threatening ischemia status post right lower extremity embolectomies and femoral to popliteal bypass.  Noted with Dr. Wilhelmenia Blase at [facility], patient continued on Eliquis.  Follow-up echocardiogram in July done at [facility] is outlined below.  He states that he has not had any significant angina symptoms on current medical regimen.  Actually, he has cut his morning Imdur down to 30 mg daily as he sometimes feels lightheaded in the mornings.  Does not report any palpitations.  We discussed continuing Eliquis long-term.  He had postoperative atrial fibrillation after last CABG, with more recent arterial  occlusion requiring embolectomy, he may have been having intermittent PAF without symptoms.  Follow-up CBC and BMET will be obtained.  He states that he is ambulating, postoperative leg swelling has been steadily improving.  He does not report any bleeding problems or changes in stools.  Cardiac catheterization was performed by Dr. Claiborne Billings on May 1 and showed patent LIMA to LAD, patentradialto diagonal, and occluded sequential SVG to the OM, PDA, and PLA 2. Ostial circumflex occluded at stent site, RCA showed fairly severe calcified disease as detailed below. Medical therapy was modified by Dr. Claiborne Billings with question remaining as to whether complex PCI would be pursued if needed.  The patient does not have symptoms concerning for COVID-19 infection (fever, chills, cough, or new shortness of breath).    Past Medical History:  Diagnosis Date  . Arthritis   . Ascending aortic aneurysm [facility])    Status post repair 12/2011 at [facility]  . BPH (benign prostatic hyperplasia)   . Chronic diastolic CHF (congestive heart failure) ([place])   . Coronary atherosclerosis of native coronary artery    a. Multivessel status post prior stenting and ultimately CABG 12/2011 at [facility] with with LIMA to LAD, SVG to PLB, SVG to ramus, SVG to OM . b. Last cath 06/2014 (following ischemic nuc) -> medical therapy, no feasible way to revascularize LCx territory.  . Essential hypertension   . Mixed hyperlipidemia   . Myocardial infarction ([place])   . PAD (peripheral artery disease) ([place])   . Postoperative atrial fibrillation ([place])   . Supraventricular tachycardia ([place])    Past Surgical History:  Procedure Laterality Date  . ASCENDING AORTIC ANEURYSM REPAIR  12/2011 [facility]   26 mm.Dacron graft  . CORONARY ARTERY BYPASS GRAFT  12/2011 [facility]   LIMA to LAD, SVG to PLB, SVG to ramus, SVG to OM  . CORONARY ARTERY BYPASS GRAFT N/A 08/06/2017   Procedure: REDO CORONARY ARTERY BYPASS GRAFTING (CABG) TIMES FOUR UTILIZING LEFT GREATER  SAPHENOUS VEIN HAVESTED ENDOVASCULARLY, LEFT RADIAL ARTERY.  RIGHT AXILLARY CANNULATION;  Surgeon: Gaye Pollack, MD;  Location: [place];  Service: Open Heart Surgery;  Laterality: N/A;  . HERNIA REPAIR    . LEFT HEART CATH AND CORS/GRAFTS ANGIOGRAPHY N/A 07/11/2017   Procedure: LEFT HEART CATH AND CORS/GRAFTS ANGIOGRAPHY;  Surgeon: Troy Sine, MD;  Location: [place] CV LAB;  Service: Cardiovascular;  Laterality: N/A;  . LEFT HEART CATH AND CORS/GRAFTS ANGIOGRAPHY N/A 09/12/2018   Procedure: LEFT HEART CATH AND CORS/GRAFTS ANGIOGRAPHY;  Surgeon: Troy Sine, MD;  Location: [place] CV LAB;  Service: Cardiovascular;  Laterality: N/A;  . LEFT HEART CATHETERIZATION WITH CORONARY ANGIOGRAM N/A 06/18/2014   Procedure: LEFT HEART CATHETERIZATION WITH CORONARY ANGIOGRAM;  Surgeon: Blane Ohara, MD;  Location: [facility] CATH LAB;  Service: Cardiovascular;  Laterality: N/A;  . RADIAL ARTERY HARVEST Left 08/06/2017   Procedure: RADIAL ARTERY HARVEST;  Surgeon: Gaye Pollack, MD;  Location: [place];  Service: Open Heart Surgery;  Laterality: Left;  . TEE WITHOUT CARDIOVERSION N/A 08/06/2017   Procedure: TRANSESOPHAGEAL ECHOCARDIOGRAM (TEE);  Surgeon: Gaye Pollack, MD;  Location: [place];  Service: Open Heart Surgery;  Laterality: N/A;  . TOTAL HIP ARTHROPLASTY Right 02/07/2016   Procedure: RIGHT TOTAL HIP ARTHROPLASTY ANTERIOR APPROACH;  Surgeon: Mcarthur Rossetti, MD;  Location: [place];  Service: Orthopedics;  Laterality: Right;  . TRANSURETHRAL RESECTION OF PROSTATE       Current Meds  Medication Sig  . amiodarone (PACERONE) 200 MG tablet TAKE 1/2 TABLET BY MOUTH DAILY (CUT IN HALF FOR PATIENT)  . apixaban (ELIQUIS) 5 MG TABS tablet Take 1 tablet (5 mg total) by mouth 2 (two) times daily.  [name] aspirin EC 81 MG tablet Take 81 mg by mouth daily.  [name] atorvastatin (LIPITOR) 40 MG tablet Take 1 tablet (40 mg total) by mouth at bedtime.  [name] diltiazem (CARDIZEM CD) 240 MG 24 hr capsule Take 1 capsule  (240 mg total) by mouth daily.  . finasteride (PROSCAR) 5 MG tablet Take 5 mg by mouth daily.  . furosemide (LASIX) 20 MG tablet TAKE TWO TABLETS BY MOUTH DAILY  . isosorbide mononitrate (IMDUR) 60 MG 24 hr tablet Take 60 mg in the AM & 60 mg in the PM. (Patient taking differently: Take 30 mg in the AM & 60 mg in the PM.)  . metoprolol tartrate (LOPRESSOR) 25 MG tablet TAKE 1 TABLET BY MOUTH TWICE DAILY  . Multiple Vitamin (MULTIVITAMIN) tablet Take 1 tablet by mouth at bedtime.   . nitroGLYCERIN (NITROSTAT) 0.4 MG SL tablet PLACE 1 TABLET UNDER TONGUE EVERY 5 MINUTES AS NEEDED FOR CHEST PAIN (Patient taking differently: Place 0.4 mg under the tongue every 5 (five) minutes as needed for chest pain. )  . Tamsulosin HCl (FLOMAX) 0.4 MG CAPS Take 0.4 mg by mouth daily.   . [DISCONTINUED] aspirin 325 MG tablet Take 325 mg by mouth daily.     Allergies:   Patient has no known allergies.   Social History   Tobacco Use  . Smoking status: Never Smoker  . Smokeless tobacco: Never Used  .  Tobacco comment: tobacco use - no  Substance Use Topics  . Alcohol use: Yes    Alcohol/week: 0.0 standard drinks    Comment: Occasional  . Drug use: No     Family Hx: The patient's family history includes Hypertension in his father and mother; Lung cancer in his sister; Parkinson's disease in his father.  ROS:   Please see the history of present illness. All other systems reviewed and are negative.   Prior CV studies:   The following studies were reviewed today:  Cardiac catheterization 09/12/2018:  Prox LAD-1 lesion is 80% stenosed.  Prox LAD-2 lesion is 70% stenosed.  Ost Cx to Prox Cx lesion is 100% stenosed.  Prox RCA to Mid RCA lesion is 30% stenosed.  Mid RCA lesion is 20% stenosed.  Post Atrio lesion is 95% stenosed.  Ost RPDA lesion is 80% stenosed.  1st RPLB lesion is 50% stenosed.  Origin lesion before RPDA is 100% stenosed.  Ost LAD lesion is 30% stenosed.  Severe native  CAD with 30% ostial and 80% proximal proximal LAD stenosis before the first septal perforating artery and diagonal vessel with competitive filling of the mid LAD and mid diagonal vessel by way of the LIMA graft and radial artery graft.   Occluded ostial circumflex stent.  Very large dominant RCA with prior mid stent with 20% intimal hyperplasia, and previously noted 95% rock-like calcification immediately after the takeoff of the PDA vessel with new 80% ostial PDA stenosis, 50% PL1 stenosis, with very large PDA, PL 1 and PL 2 branches.  Patent LIMA to LAD from the initial surgery of 2013. There are very faint distal collaterals supplying the distal OM branch from the apical portion of the LAD.  Patent radial graft supplying the diagonal vessel from surgery of 2019  Occluded sequential vein graft which had supplied the PDA, PLA 2, and obtuse marginal vessel from 2019 surgery.  Previous occlusions of prior vein grafts from 2013 surgery.  Echocardiogram 11/29/2018 [facility]):  Technically difficult study due to chest wall/lung interference  Ultrasound enhancing agent utilized to improve endocardial border  definition  Left ventricular hypertrophy - mild to moderate  Normal left ventricular systolic function, ejection fraction 55 to 60%  Degenerative mitral valve disease  Aortic sclerosis  Aortic regurgitation - mild  Dilated ascending aorta  Normal right ventricular systolic function  Labs/Other Tests and Data Reviewed:    EKG:  An ECG dated 09/10/2018 was personally reviewed today and demonstrated:  Sinus bradycardia with prolonged PR interval, old anterior infarct pattern.  Recent Labs: 06/25/2018: Magnesium 2.0 09/10/2018: BUN 19; Creatinine, Ser 1.03; Hemoglobin 14.4; Platelets 236; Potassium 4.1; Sodium 140  July 2020: Potassium 4.3, BUN 16, creatinine 0.84, troponin I less than 0.034, hemoglobin 11.5, platelets 249  Recent Lipid Panel Lab Results  Component Value  Date/Time   CHOL 130 03/06/2017 06:10 AM   TRIG 262 (H) 03/06/2017 06:10 AM   HDL 28 (L) 03/06/2017 06:10 AM   CHOLHDL 4.6 03/06/2017 06:10 AM   LDLCALC 50 03/06/2017 06:10 AM    Wt Readings from Last 3 Encounters:  02/09/19 185 lb (83.9 kg)  11/11/18 187 lb (84.8 kg)  10/16/18 190 lb 9.6 oz (86.5 kg)     Objective:    Vital Signs:  BP (!) 142/66   Pulse (!) 54   Ht 5\' 7"  (1.702 m)   Wt 185 lb (83.9 kg)   BMI 28.98 kg/m    Patient spoke in full sentences, not short of breath.  No audible wheezing or coughing. Speech pattern normal.  ASSESSMENT & PLAN:    1.  Multivessel CAD status post CABG in 2013 with concurrent repair of ascending aortic aneurysm at [facility]. He underwent redo operation in May 2019 due to progressive angina and graft failure with placement of radial to diagonal, SVG to OM, and SVG to PDA and PL 2. Unfortunately, he has had further graft failure including revascularization of the OM and RCA distribution. LIMA and radial to LAD and diagonal respectively are patent.  At this time he reports no progressive angina on current medical regimen which will be continued.  2.  PAD status post right lower extremity embolectomies and femoral to popliteal bypass at [facility] in July.  3.  History of postoperative atrial fibrillation, possibly PAF as well.  We continue amiodarone for rhythm suppression, we discussed continuing Eliquis long-term at this point.  Check CBC and BMET.  4.  Essential hypertension, no changes made to current regimen.  COVID-19 Education: The signs and symptoms of COVID-19 were discussed with the patient and how to seek care for testing (follow up with PCP or arrange E-visit).  The importance of social distancing was discussed today.  Time:   Today, I have spent 11 minutes with the patient with telehealth technology discussing the above problems.     Medication Adjustments/Labs and Tests Ordered: Current medicines are reviewed at length  with the patient today.  Concerns regarding medicines are outlined above.   Tests Ordered: Orders Placed This Encounter  Procedures  . Basic Metabolic Panel (BMET)  . CBC with Differential    Medication Changes: No orders of the defined types were placed in this encounter.   Follow Up:  In Person 3 months in the [place] office.  Signed, Rozann Lesches, MD  02/09/2019 11:32 AM    [place]

## 2019-02-09 NOTE — Patient Instructions (Signed)
Medication Instructions:  Your physician recommends that you continue on your current medications as directed. Please refer to the Current Medication list given to you today.   Labwork: Cbc cmet  Testing/Procedures: None  Follow-Up: Your physician recommends that you schedule a follow-up appointment in: 3 months in [place] office   Any Other Special Instructions Will Be Listed Below (If Applicable).     If you need a refill on your cardiac medications before your next appointment, please call your pharmacy.

## 2019-02-18 ENCOUNTER — Other Ambulatory Visit: Payer: Self-pay | Admitting: *Deleted

## 2019-02-18 MED ORDER — ATORVASTATIN CALCIUM 40 MG PO TABS: 40.0000 mg | ORAL_TABLET | Freq: Every day | ORAL | 3 refills | Status: DC

## 2019-03-03 DIAGNOSIS — H2513 Age-related nuclear cataract, bilateral: Secondary | ICD-10-CM | POA: Diagnosis not present

## 2019-03-03 DIAGNOSIS — H25013 Cortical age-related cataract, bilateral: Secondary | ICD-10-CM | POA: Diagnosis not present

## 2019-03-03 DIAGNOSIS — H43813 Vitreous degeneration, bilateral: Secondary | ICD-10-CM | POA: Diagnosis not present

## 2019-03-03 DIAGNOSIS — H35033 Hypertensive retinopathy, bilateral: Secondary | ICD-10-CM | POA: Diagnosis not present

## 2019-03-10 ENCOUNTER — Other Ambulatory Visit: Payer: Self-pay

## 2019-03-10 DIAGNOSIS — Z20822 Contact with and (suspected) exposure to covid-19: Secondary | ICD-10-CM

## 2019-03-12 LAB — NOVEL CORONAVIRUS, NAA: SARS-CoV-2, NAA: NOT DETECTED

## 2019-03-16 ENCOUNTER — Telehealth: Payer: Self-pay

## 2019-03-16 NOTE — Telephone Encounter (Signed)
Pt. Given COVID 19 results. Verbalizes understanding. 

## 2019-03-17 ENCOUNTER — Other Ambulatory Visit: Payer: Self-pay | Admitting: Cardiology

## 2019-03-18 DIAGNOSIS — H2511 Age-related nuclear cataract, right eye: Secondary | ICD-10-CM | POA: Diagnosis not present

## 2019-03-18 DIAGNOSIS — H25811 Combined forms of age-related cataract, right eye: Secondary | ICD-10-CM | POA: Diagnosis not present

## 2019-03-23 DIAGNOSIS — H2512 Age-related nuclear cataract, left eye: Secondary | ICD-10-CM | POA: Diagnosis not present

## 2019-03-23 DIAGNOSIS — H25012 Cortical age-related cataract, left eye: Secondary | ICD-10-CM | POA: Diagnosis not present

## 2019-03-25 ENCOUNTER — Other Ambulatory Visit: Payer: Self-pay | Admitting: Cardiology

## 2019-04-01 DIAGNOSIS — H2512 Age-related nuclear cataract, left eye: Secondary | ICD-10-CM | POA: Diagnosis not present

## 2019-04-01 DIAGNOSIS — H25812 Combined forms of age-related cataract, left eye: Secondary | ICD-10-CM | POA: Diagnosis not present

## 2019-04-01 DIAGNOSIS — H25012 Cortical age-related cataract, left eye: Secondary | ICD-10-CM | POA: Diagnosis not present

## 2019-04-04 IMAGING — CR DG CHEST 1V PORT
1 series · 1 of 1 positions shown · non-contrast
Comparison: 09/11/2017

CLINICAL DATA: Tachycardia with chest pain and dyspnea.

EXAM:
PORTABLE CHEST 1 VIEW

[ap portable]
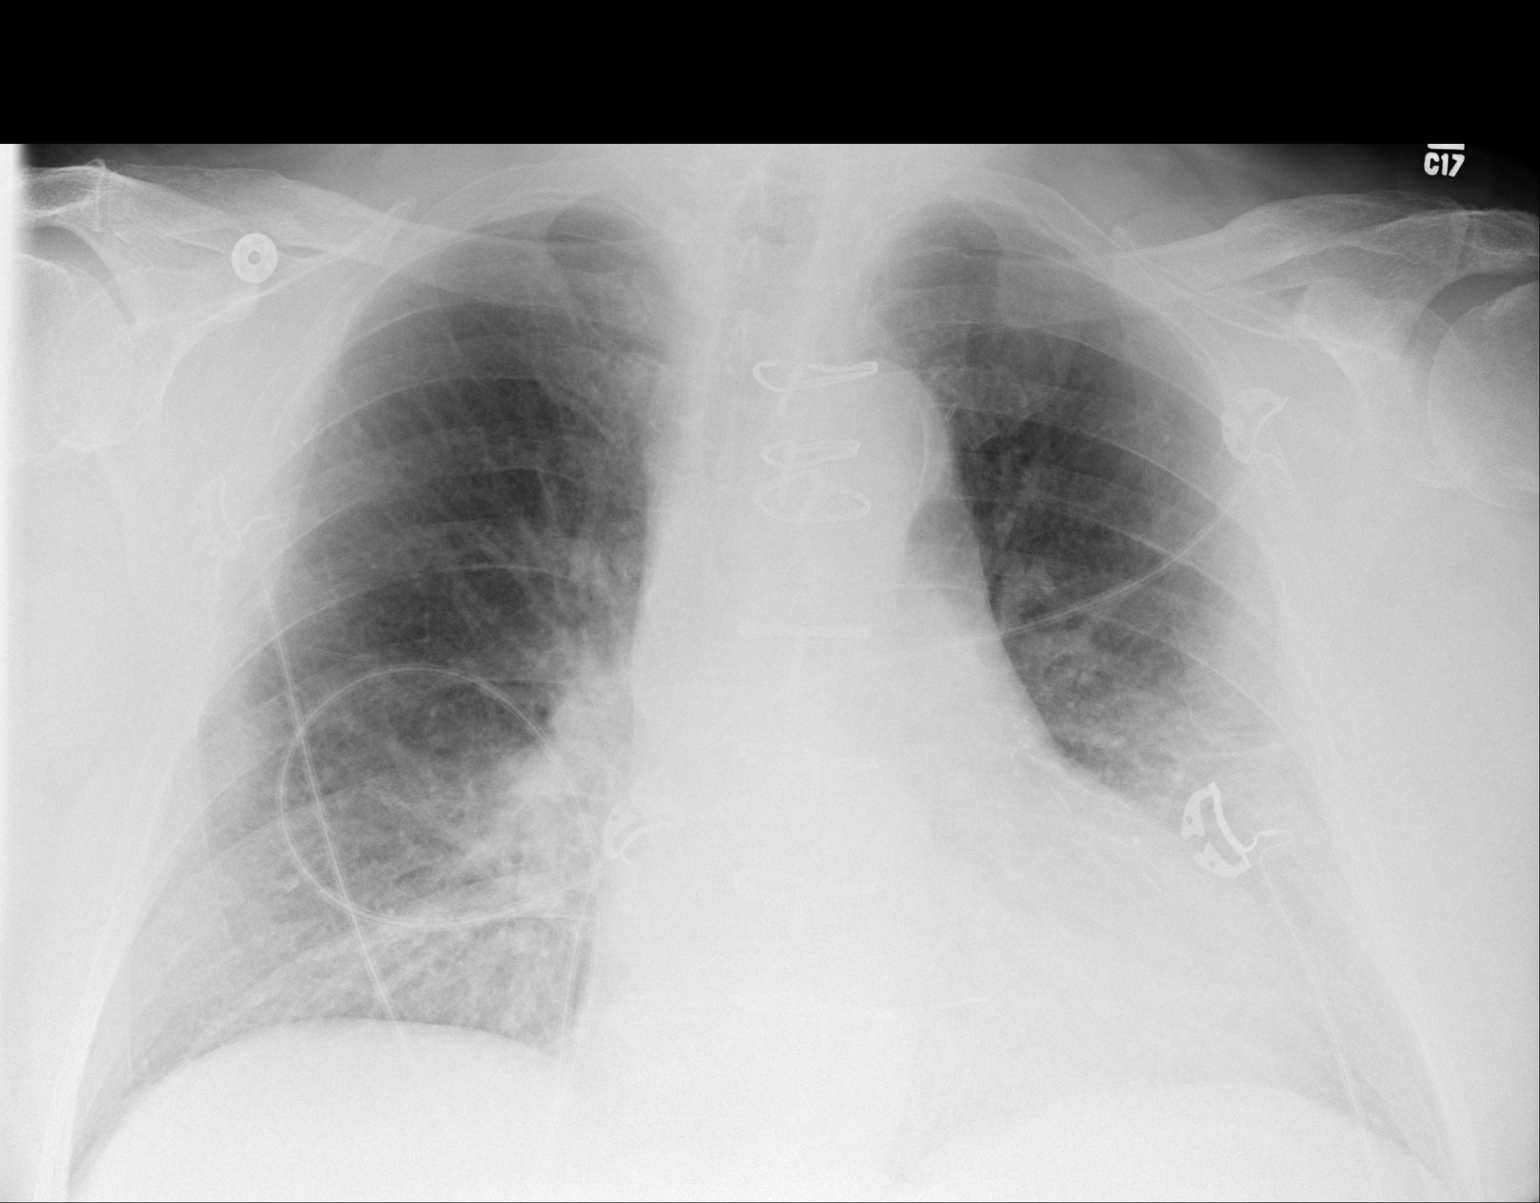

[1 of 1 positions shown; findings below may reference images not displayed]

FINDINGS: Stable cardiomegaly with aortic atherosclerosis and post CABG
change. Mild central vascular congestion and bibasilar atelectasis
is noted. No alveolar confluence. No pneumothorax or effusion. No
acute nor suspicious osseous abnormalities.
IMPRESSION: Stable cardiomegaly with aortic atherosclerosis. Mild central
vascular congestion and bibasilar atelectasis.

## 2019-04-07 DIAGNOSIS — I2510 Atherosclerotic heart disease of native coronary artery without angina pectoris: Secondary | ICD-10-CM | POA: Diagnosis not present

## 2019-04-07 DIAGNOSIS — E785 Hyperlipidemia, unspecified: Secondary | ICD-10-CM | POA: Diagnosis not present

## 2019-04-07 DIAGNOSIS — I10 Essential (primary) hypertension: Secondary | ICD-10-CM | POA: Diagnosis not present

## 2019-04-07 DIAGNOSIS — N401 Enlarged prostate with lower urinary tract symptoms: Secondary | ICD-10-CM | POA: Diagnosis not present

## 2019-04-14 ENCOUNTER — Other Ambulatory Visit: Payer: Self-pay | Admitting: Cardiology

## 2019-04-20 ENCOUNTER — Other Ambulatory Visit: Payer: Self-pay | Admitting: Cardiology

## 2019-05-11 ENCOUNTER — Ambulatory Visit: Payer: Medicare Other | Admitting: Cardiology

## 2019-05-14 ENCOUNTER — Encounter: Payer: Self-pay | Admitting: Cardiology

## 2019-05-14 ENCOUNTER — Other Ambulatory Visit: Payer: Self-pay

## 2019-05-14 ENCOUNTER — Ambulatory Visit (INDEPENDENT_AMBULATORY_CARE_PROVIDER_SITE_OTHER): Payer: Medicare Other | Admitting: Cardiology

## 2019-05-14 VITALS — BP 143/66 | HR 45 | Ht 67.0 in | Wt 198.8 lb

## 2019-05-14 DIAGNOSIS — I10 Essential (primary) hypertension: Secondary | ICD-10-CM

## 2019-05-14 DIAGNOSIS — I739 Peripheral vascular disease, unspecified: Secondary | ICD-10-CM

## 2019-05-14 DIAGNOSIS — I480 Paroxysmal atrial fibrillation: Secondary | ICD-10-CM | POA: Diagnosis not present

## 2019-05-14 DIAGNOSIS — I25119 Atherosclerotic heart disease of native coronary artery with unspecified angina pectoris: Secondary | ICD-10-CM

## 2019-05-14 MED ORDER — TAMSULOSIN 0.4 MG CAPSULE
ORAL_CAPSULE | 1 refills | 0 days | Status: CP
Start: 2019-05-14 — End: ?

## 2019-05-14 NOTE — Patient Instructions (Addendum)
Medication Instructions:  Continue all current medications.  Labwork:  CBC, CMET - orders enclosed.   Office will contact with results via phone or letter.    Testing/Procedures: none  Follow-Up: 4 months   Any Other Special Instructions Will Be Listed Below (If Applicable).  If you need a refill on your cardiac medications before your next appointment, please call your pharmacy.

## 2019-05-14 NOTE — Progress Notes (Signed)
Cardiology Office Note  Date: 05/14/2019   ID: Eric Curcio., DOB 1941/05/13, MRN [id number]  PCP:  Sinda Du, MD  Cardiologist:  Rozann Lesches, MD Electrophysiologist:  None   Chief Complaint  Patient presents with  . Cardiac follow-up    History of Present Illness: Eric Beltran. is a 78 y.o. male last assessed via telehealth encounter in September.  He presents for a routine visit.  He tells me that he has good energy, remains functional with ADLs, has had no progressive angina symptoms on current regimen, also no palpitations or syncope.  I personally reviewed his ECG today which showed sinus bradycardia in the 40s with PACs, nonspecific ST/T changes.  I did not get the sense in talking with him that the bradycardia has been functionally limiting, but we did talk about keeping an eye on this.  He does check his blood pressure and heart rate every few days.  We reviewed his current medications.  I talked with him about getting follow-up lab work on amiodarone and Eliquis.  He does not report any spontaneous bleeding episodes.  Cardiac catheterization was performed by Dr. Claiborne Billings on May 1 and showed patent LIMA to LAD, patentradialto diagonal, and occluded sequential SVG to the OM, PDA, and PLA 2. Ostial circumflex occluded at stent site, RCA showed fairly severe calcified disease as detailed below. Medical therapy was modified.  Past Medical History:  Diagnosis Date  . Arthritis   . Ascending aortic aneurysm [facility])    Status post repair 12/2011 at [facility]  . BPH (benign prostatic hyperplasia)   . Chronic diastolic CHF (congestive heart failure) ([place])   . Coronary atherosclerosis of native coronary artery    a. Multivessel status post prior stenting and ultimately CABG 12/2011 at [facility] with with LIMA to LAD, SVG to PLB, SVG to ramus, SVG to OM . b. Last cath 06/2014 (following ischemic nuc) -> medical therapy, no feasible way to revascularize LCx territory.  .  Essential hypertension   . Mixed hyperlipidemia   . Myocardial infarction ([place])   . PAD (peripheral artery disease) ([place])   . PAF (paroxysmal atrial fibrillation) ([place])   . Supraventricular tachycardia [facility])     Past Surgical History:  Procedure Laterality Date  . ASCENDING AORTIC ANEURYSM REPAIR  12/2011 [facility]   26 mm.Dacron graft  . CORONARY ARTERY BYPASS GRAFT  12/2011 [facility]   LIMA to LAD, SVG to PLB, SVG to ramus, SVG to OM  . CORONARY ARTERY BYPASS GRAFT N/A 08/06/2017   Procedure: REDO CORONARY ARTERY BYPASS GRAFTING (CABG) TIMES FOUR UTILIZING LEFT GREATER SAPHENOUS VEIN HAVESTED ENDOVASCULARLY, LEFT RADIAL ARTERY.  RIGHT AXILLARY CANNULATION;  Surgeon: Gaye Pollack, MD;  Location: [place];  Service: Open Heart Surgery;  Laterality: N/A;  . HERNIA REPAIR    . LEFT HEART CATH AND CORS/GRAFTS ANGIOGRAPHY N/A 07/11/2017   Procedure: LEFT HEART CATH AND CORS/GRAFTS ANGIOGRAPHY;  Surgeon: Troy Sine, MD;  Location: [place] CV LAB;  Service: Cardiovascular;  Laterality: N/A;  . LEFT HEART CATH AND CORS/GRAFTS ANGIOGRAPHY N/A 09/12/2018   Procedure: LEFT HEART CATH AND CORS/GRAFTS ANGIOGRAPHY;  Surgeon: Troy Sine, MD;  Location: [place] CV LAB;  Service: Cardiovascular;  Laterality: N/A;  . LEFT HEART CATHETERIZATION WITH CORONARY ANGIOGRAM N/A 06/18/2014   Procedure: LEFT HEART CATHETERIZATION WITH CORONARY ANGIOGRAM;  Surgeon: Blane Ohara, MD;  Location: [facility] CATH LAB;  Service: Cardiovascular;  Laterality: N/A;  . RADIAL ARTERY HARVEST Left 08/06/2017  Procedure: RADIAL ARTERY HARVEST;  Surgeon: Gaye Pollack, MD;  Location: [place];  Service: Open Heart Surgery;  Laterality: Left;  . TEE WITHOUT CARDIOVERSION N/A 08/06/2017   Procedure: TRANSESOPHAGEAL ECHOCARDIOGRAM (TEE);  Surgeon: Gaye Pollack, MD;  Location: [place];  Service: Open Heart Surgery;  Laterality: N/A;  . TOTAL HIP ARTHROPLASTY Right 02/07/2016   Procedure: RIGHT TOTAL HIP ARTHROPLASTY ANTERIOR  APPROACH;  Surgeon: Mcarthur Rossetti, MD;  Location: [place];  Service: Orthopedics;  Laterality: Right;  . TRANSURETHRAL RESECTION OF PROSTATE      Current Outpatient Medications  Medication Sig Dispense Refill  . amiodarone (PACERONE) 200 MG tablet TAKE 1/2 TABLET BY MOUTH DAILY (CUT IN HALF FOR PATIENT) 45 tablet 6  . aspirin EC 81 MG tablet Take 81 mg by mouth daily.    [name] atorvastatin (LIPITOR) 40 MG tablet Take 1 tablet (40 mg total) by mouth at bedtime. 90 tablet 3  . CARTIA XT 240 MG 24 hr capsule TAKE ONE CAPSULE BY MOUTH DAILY 90 capsule 1  . ELIQUIS 5 MG TABS tablet TAKE 1 TABLET BY MOUTH TWICE DAILY 60 tablet 3  . finasteride (PROSCAR) 5 MG tablet Take 5 mg by mouth daily.    . furosemide (LASIX) 20 MG tablet TAKE TWO TABLETS BY MOUTH DAILY 180 tablet 3  . isosorbide mononitrate (IMDUR) 60 MG 24 hr tablet Take 1/2 tab (30mg ) by mouth at lunch & 1 tab (60mg ) at bedtime    . metoprolol tartrate (LOPRESSOR) 25 MG tablet TAKE 1 TABLET BY MOUTH TWICE DAILY 180 tablet 2  . Multiple Vitamin (MULTIVITAMIN) tablet Take 1 tablet by mouth at bedtime.     . nitroGLYCERIN (NITROSTAT) 0.4 MG SL tablet PLACE 1 TABLET UNDER TONGUE EVERY 5 MINUTES AS NEEDED FOR CHEST PAIN (Patient taking differently: Place 0.4 mg under the tongue every 5 (five) minutes as needed for chest pain. ) 25 tablet 3  . Tamsulosin HCl (FLOMAX) 0.4 MG CAPS Take 0.4 mg by mouth daily.      No current facility-administered medications for this visit.   Allergies:  Patient has no known allergies.   Social History: The patient  reports that he has never smoked. He has never used smokeless tobacco. He reports current alcohol use. He reports that he does not use drugs.   ROS:  Please see the history of present illness. Otherwise, complete review of systems is positive for none.  All other systems are reviewed and negative.   Physical Exam: VS:  BP (!) 143/66   Pulse (!) 45   Ht 5\' 7"  (1.702 m)   Wt 198 lb 12.8 oz (90.2  kg)   SpO2 97%   BMI 31.14 kg/m , BMI Body mass index is 31.14 kg/m.  Wt Readings from Last 3 Encounters:  05/14/19 198 lb 12.8 oz (90.2 kg)  02/09/19 185 lb (83.9 kg)  11/11/18 187 lb (84.8 kg)    General: Patient appears comfortable at rest. HEENT: Conjunctiva and lids normal, wearing a mask. Neck: Supple, no elevated JVP or carotid bruits, no thyromegaly. Lungs: Clear to auscultation, nonlabored breathing at rest. Cardiac: Regular rate and rhythm, no S3, 2/6 systolic murmur. Abdomen: Soft, nontender, bowel sounds present. Extremities: No pitting edema, distal pulses 2+. Skin: Warm and dry. Musculoskeletal: No kyphosis. Neuropsychiatric: Alert and oriented x3, affect grossly appropriate.  ECG:  An ECG dated 09/10/2018 was personally reviewed today and demonstrated:  Sinus bradycardia with prolonged PR interval, old anterior infarct pattern.  Recent Labwork:  06/25/2018: Magnesium 2.0 09/10/2018: BUN 19; Creatinine, Ser 1.03; Hemoglobin 14.4; Platelets 236; Potassium 4.1; Sodium 140     Component Value Date/Time   CHOL 130 03/06/2017 0610   TRIG 262 (H) 03/06/2017 0610   HDL 28 (L) 03/06/2017 0610   CHOLHDL 4.6 03/06/2017 0610   VLDL 52 (H) 03/06/2017 0610   LDLCALC 50 03/06/2017 0610  July 2020: Potassium 4.3, BUN 16, creatinine 0.84, troponin I less than 0.034, hemoglobin 11.5, platelets 249  Other Studies Reviewed Today:  Cardiac catheterization 09/12/2018:  Prox LAD-1 lesion is 80% stenosed.  Prox LAD-2 lesion is 70% stenosed.  Ost Cx to Prox Cx lesion is 100% stenosed.  Prox RCA to Mid RCA lesion is 30% stenosed.  Mid RCA lesion is 20% stenosed.  Post Atrio lesion is 95% stenosed.  Ost RPDA lesion is 80% stenosed.  1st RPLB lesion is 50% stenosed.  Origin lesion before RPDA is 100% stenosed.  Ost LAD lesion is 30% stenosed.  Severe native CAD with 30% ostial and 80% proximal proximal LAD stenosis before the first septal perforating artery and diagonal  vessel with competitive filling of the mid LAD and mid diagonal vessel by way of the LIMA graft and radial artery graft.   Occluded ostial circumflex stent.  Very large dominant RCA with prior mid stent with 20% intimal hyperplasia, and previously noted 95% rock-like calcification immediately after the takeoff of the PDA vessel with new 80% ostial PDA stenosis, 50% PL1 stenosis, with very large PDA, PL 1 and PL 2 branches.  Patent LIMA to LAD from the initial surgery of 2013. There are very faint distal collaterals supplying the distal OM branch from the apical portion of the LAD.  Patent radial graft supplying the diagonal vessel from surgery of 2019  Occluded sequential vein graft which had supplied the PDA, PLA 2, and obtuse marginal vessel from 2019 surgery.  Previous occlusions of prior vein grafts from 2013 surgery.  Echocardiogram 11/29/2018 [facility]):  Technically difficult study due to chest wall/lung interference  Ultrasound enhancing agent utilized to improve endocardial border  definition  Left ventricular hypertrophy - mild to moderate  Normal left ventricular systolic function, ejection fraction 55 to 60%  Degenerative mitral valve disease  Aortic sclerosis  Aortic regurgitation - mild  Dilated ascending aorta  Normal right ventricular systolic function  Assessment and Plan:  1.  Multivessel CAD status post CABG in 2013 with concurrent repair of ascending aortic aneurysm at [facility].  He underwent redo operation in May 2019 in the setting of graft failure with placement of radial to the diagonal, SVG to OM, and SVG to PDA and PL 2.  Since that time he has had further graft failure and revascularization of the OM and RCA distributions.  LIMA and radial to LAD and diagonal are patent respectively.  In the absence of progressive angina we continue with medical therapy and observation.  Current regimen includes aspirin, Lipitor, Lopressor, and Imdur.  2.   Paroxysmal atrial fibrillation.  He is in sinus rhythm today, bradycardic but not clearly symptomatic.  Plan to continue amiodarone and Eliquis.  Check CBC and CMET.  3.  PAD status post right lower extremity embolectomies and femoral to popliteal bypass at [facility] back in July.  4.  Essential hypertension, systolic blood pressure in the 140s today.  No changes made to current medications.  Medication Adjustments/Labs and Tests Ordered: Current medicines are reviewed at length with the patient today.  Concerns regarding medicines are  outlined above.   Tests Ordered: Orders Placed This Encounter  Procedures  . CBC  . Comprehensive metabolic panel    Medication Changes: No orders of the defined types were placed in this encounter.   Disposition:  Follow up 4 months in the [place] office.  Signed, Satira Sark, MD, [facility] 05/14/2019 11:31 AM    [place] at [place], [place], [place] [address] Phone: [phone]; Fax: [phone]

## 2019-05-18 NOTE — Addendum Note (Signed)
Addended by: Julian Hy T on: 05/18/2019 10:48 AM   Modules accepted: Orders

## 2019-05-27 ENCOUNTER — Ambulatory Visit: Admit: 2019-05-27 | Discharge: 2019-05-27 | Payer: MEDICARE

## 2019-06-04 ENCOUNTER — Telehealth: Payer: Self-pay | Admitting: *Deleted

## 2019-06-04 NOTE — Telephone Encounter (Signed)
-----   Message from Satira Sark, MD sent at 06/02/2019  7:31 PM EST ----- Results reviewed.  Renal function and potassium are normal.  LFTs normal.  Hemoglobin normal.  Continue with current medications.

## 2019-06-08 NOTE — Telephone Encounter (Signed)
Patient informed. Copy sent to PCP °

## 2019-06-10 ENCOUNTER — Telehealth
Admit: 2019-06-10 | Discharge: 2019-06-11 | Payer: MEDICARE | Attending: Nurse Practitioner | Primary: Nurse Practitioner

## 2019-06-10 MED ORDER — TAMSULOSIN 0.4 MG CAPSULE
ORAL_CAPSULE | Freq: Every day | ORAL | 3 refills | 90 days | Status: CP
Start: 2019-06-10 — End: ?

## 2019-06-17 ENCOUNTER — Ambulatory Visit: Admit: 2019-06-17 | Discharge: 2019-06-18 | Payer: MEDICARE

## 2019-06-24 ENCOUNTER — Ambulatory Visit: Admit: 2019-06-24 | Discharge: 2019-06-25 | Payer: MEDICARE | Attending: Vascular Surgery | Primary: Vascular Surgery

## 2019-06-24 DIAGNOSIS — I739 Peripheral vascular disease, unspecified: Principal | ICD-10-CM

## 2019-06-25 ENCOUNTER — Ambulatory Visit: Admit: 2019-06-25 | Discharge: 2019-06-26 | Payer: MEDICARE

## 2019-06-25 DIAGNOSIS — D229 Melanocytic nevi, unspecified: Principal | ICD-10-CM

## 2019-06-25 DIAGNOSIS — L578 Other skin changes due to chronic exposure to nonionizing radiation: Principal | ICD-10-CM

## 2019-06-25 DIAGNOSIS — Z85828 Personal history of other malignant neoplasm of skin: Principal | ICD-10-CM

## 2019-06-25 DIAGNOSIS — L821 Other seborrheic keratosis: Principal | ICD-10-CM

## 2019-07-15 ENCOUNTER — Other Ambulatory Visit: Payer: Self-pay | Admitting: Cardiology

## 2019-08-13 ENCOUNTER — Other Ambulatory Visit: Payer: Self-pay | Admitting: Cardiology

## 2019-08-13 ENCOUNTER — Other Ambulatory Visit: Payer: Self-pay | Admitting: *Deleted

## 2019-08-13 MED ORDER — ATORVASTATIN CALCIUM 40 MG PO TABS: 40.0000 mg | ORAL_TABLET | Freq: Every day | ORAL | 3 refills | Status: DC

## 2019-08-19 ENCOUNTER — Telehealth: Payer: Self-pay | Admitting: Cardiology

## 2019-08-19 NOTE — Telephone Encounter (Signed)
Patient is asking to speak to Dr. Domenic Polite about the heart cath he had awhile back and and was told that he needed additional treatments to vessels but was risky. Advised patient that he has an appointment with Dr. Domenic Polite on 09/22/2019 and could discuss this with him at that time. Agreed and verbalized understanding.

## 2019-08-19 NOTE — Telephone Encounter (Signed)
Would like to speak with someone in reference to Eric Beltran

## 2019-09-17 ENCOUNTER — Other Ambulatory Visit: Payer: Self-pay | Admitting: Cardiology

## 2019-09-21 NOTE — Progress Notes (Signed)
Cardiology Office Note  Date: 09/22/2019   ID: Eric Borer., DOB 08-26-1940, MRN [id number]  PCP:  Patient, No Pcp Per  Cardiologist:  Rozann Lesches, MD Electrophysiologist:  None   Chief Complaint  Patient presents with  . Cardiac follow-up    History of Present Illness: Eric Frickey. is a 79 y.o. male last seen in December 2020.  He presents for a follow-up visit.  States that he has been taking his medications as directed, still having a relative increase in his exertional angina symptoms over the last few months.  He did not tolerate higher dose Imdur.  Cardiac catheterization from May 2020 is outlined below, main ischemic territory is likely RCA distribution in light of graft failure, although PCI options are challenging.  We went over his cardiac medications which are listed below and discussed trial of Ranexa.  I did review his lab work from January.  Past Medical History:  Diagnosis Date  . Arthritis   . Ascending aortic aneurysm [facility])    Status post repair 12/2011 at [facility]  . BPH (benign prostatic hyperplasia)   . Chronic diastolic CHF (congestive heart failure) ([place])   . Coronary atherosclerosis of native coronary artery    a. Multivessel status post prior stenting and ultimately CABG 12/2011 at [facility] with with LIMA to LAD, SVG to PLB, SVG to ramus, SVG to OM . b. Last cath 06/2014 (following ischemic nuc) -> medical therapy, no feasible way to revascularize LCx territory.  . Essential hypertension   . Mixed hyperlipidemia   . Myocardial infarction ([place])   . PAD (peripheral artery disease) ([place])   . PAF (paroxysmal atrial fibrillation) ([place])   . Supraventricular tachycardia [facility])     Past Surgical History:  Procedure Laterality Date  . ASCENDING AORTIC ANEURYSM REPAIR  12/2011 [facility]   26 mm.Dacron graft  . CORONARY ARTERY BYPASS GRAFT  12/2011 [facility]   LIMA to LAD, SVG to PLB, SVG to ramus, SVG to OM  . CORONARY ARTERY BYPASS GRAFT N/A 08/06/2017     Procedure: REDO CORONARY ARTERY BYPASS GRAFTING (CABG) TIMES FOUR UTILIZING LEFT GREATER SAPHENOUS VEIN HAVESTED ENDOVASCULARLY, LEFT RADIAL ARTERY.  RIGHT AXILLARY CANNULATION;  Surgeon: Gaye Pollack, MD;  Location: [place];  Service: Open Heart Surgery;  Laterality: N/A;  . HERNIA REPAIR    . LEFT HEART CATH AND CORS/GRAFTS ANGIOGRAPHY N/A 07/11/2017   Procedure: LEFT HEART CATH AND CORS/GRAFTS ANGIOGRAPHY;  Surgeon: Troy Sine, MD;  Location: [place] CV LAB;  Service: Cardiovascular;  Laterality: N/A;  . LEFT HEART CATH AND CORS/GRAFTS ANGIOGRAPHY N/A 09/12/2018   Procedure: LEFT HEART CATH AND CORS/GRAFTS ANGIOGRAPHY;  Surgeon: Troy Sine, MD;  Location: [place] CV LAB;  Service: Cardiovascular;  Laterality: N/A;  . LEFT HEART CATHETERIZATION WITH CORONARY ANGIOGRAM N/A 06/18/2014   Procedure: LEFT HEART CATHETERIZATION WITH CORONARY ANGIOGRAM;  Surgeon: Blane Ohara, MD;  Location: [facility] CATH LAB;  Service: Cardiovascular;  Laterality: N/A;  . RADIAL ARTERY HARVEST Left 08/06/2017   Procedure: RADIAL ARTERY HARVEST;  Surgeon: Gaye Pollack, MD;  Location: [place];  Service: Open Heart Surgery;  Laterality: Left;  . TEE WITHOUT CARDIOVERSION N/A 08/06/2017   Procedure: TRANSESOPHAGEAL ECHOCARDIOGRAM (TEE);  Surgeon: Gaye Pollack, MD;  Location: [place];  Service: Open Heart Surgery;  Laterality: N/A;  . TOTAL HIP ARTHROPLASTY Right 02/07/2016   Procedure: RIGHT TOTAL HIP ARTHROPLASTY ANTERIOR APPROACH;  Surgeon: Mcarthur Rossetti, MD;  Location: [place];  Service: Orthopedics;  Laterality: Right;  . TRANSURETHRAL RESECTION OF PROSTATE      Current Outpatient Medications  Medication Sig Dispense Refill  . amiodarone (PACERONE) 200 MG tablet TAKE 1/2 TABLET BY MOUTH DAILY (cut in half) 45 tablet 3  . Ascorbic Acid (VITAMIN C WITH ROSE HIPS) 500 MG tablet Take 500 mg by mouth daily.    [name] aspirin EC 81 MG tablet Take 81 mg by mouth daily.    [name] atorvastatin (LIPITOR) 40 MG  tablet Take 1 tablet (40 mg total) by mouth at bedtime. 90 tablet 3  . CARTIA XT 240 MG 24 hr capsule TAKE ONE CAPSULE BY MOUTH DAILY 90 capsule 1  . ELIQUIS 5 MG TABS tablet TAKE 1 TABLET BY MOUTH TWICE DAILY 60 tablet 3  . finasteride (PROSCAR) 5 MG tablet Take 5 mg by mouth daily.    . furosemide (LASIX) 20 MG tablet TAKE TWO TABLETS BY MOUTH DAILY 180 tablet 3  . metoprolol tartrate (LOPRESSOR) 25 MG tablet TAKE 1 TABLET BY MOUTH TWICE DAILY 180 tablet 2  . Multiple Vitamin (MULTIVITAMIN) tablet Take 1 tablet by mouth at bedtime.     . nitroGLYCERIN (NITROSTAT) 0.4 MG SL tablet PLACE 1 TABLET UNDER TONGUE EVERY 5 MINUTES AS NEEDED FOR CHEST PAIN (Patient taking differently: Place 0.4 mg under the tongue every 5 (five) minutes as needed for chest pain. ) 25 tablet 3  . Omega-3 Fatty Acids (FISH OIL) 1000 MG CAPS Take 1 capsule by mouth daily.    . Tamsulosin HCl (FLOMAX) 0.4 MG CAPS Take 0.4 mg by mouth daily.     [name] zinc gluconate 50 MG tablet Take 50 mg by mouth daily.    . isosorbide mononitrate (IMDUR) 30 MG 24 hr tablet Take 1 tablet (30 mg total) by mouth 2 (two) times daily. 180 tablet 3  . ranolazine (RANEXA) 500 MG 12 hr tablet Take 1 tablet (500 mg total) by mouth 2 (two) times daily. 60 tablet 2   No current facility-administered medications for this visit.   Allergies:  Patient has no known allergies.   ROS:   No palpitations or syncope.  Physical Exam: VS:  BP (!) 142/80   Pulse (!) 52   Ht 5\' 7"  (1.702 m)   Wt 191 lb (86.6 kg)   SpO2 97%   BMI 29.91 kg/m , BMI Body mass index is 29.91 kg/m.  Wt Readings from Last 3 Encounters:  09/22/19 191 lb (86.6 kg)  05/14/19 198 lb 12.8 oz (90.2 kg)  02/09/19 185 lb (83.9 kg)    General: Patient appears comfortable at rest. HEENT: Conjunctiva and lids normal, wearing a mask. Neck: Supple, no elevated JVP or carotid bruits, no thyromegaly. Lungs: Clear to auscultation, nonlabored breathing at rest. Cardiac: Regular rate and  rhythm, no S3, 2/6 systolic and diastolic murmurs, no pericardial rub. Abdomen: Soft, bowel sounds present, no guarding or rebound. Extremities: No pitting edema, distal pulses 2+.   ECG:  An ECG dated 05/14/2019 was personally reviewed today and demonstrated:  Sinus bradycardia with PACs, nonspecific ST-T changes.  Recent Labwork:  January 2021: BUN 27, creatinine 1.21, potassium 4.2, AST 18, ALT 19, hemoglobin 14.1, platelets 233  Other Studies Reviewed Today:  Cardiac catheterization 09/12/2018:  Prox LAD-1 lesion is 80% stenosed.  Prox LAD-2 lesion is 70% stenosed.  Ost Cx to Prox Cx lesion is 100% stenosed.  Prox RCA to Mid RCA lesion is 30% stenosed.  Mid RCA lesion is 20% stenosed.  Post  Atrio lesion is 95% stenosed.  Ost RPDA lesion is 80% stenosed.  1st RPLB lesion is 50% stenosed.  Origin lesion before RPDA is 100% stenosed.  Ost LAD lesion is 30% stenosed.  Severe native CAD with 30% ostial and 80% proximal proximal LAD stenosis before the first septal perforating artery and diagonal vessel with competitive filling of the mid LAD and mid diagonal vessel by way of the LIMA graft and radial artery graft.   Occluded ostial circumflex stent.  Very large dominant RCA with prior mid stent with 20% intimal hyperplasia, and previously noted 95% rock-like calcification immediately after the takeoff of the PDA vessel with new 80% ostial PDA stenosis, 50% PL1 stenosis, with very large PDA, PL 1 and PL 2 branches.  Patent LIMA to LAD from the initial surgery of 2013. There are very faint distal collaterals supplying the distal OM branch from the apical portion of the LAD.  Patent radial graft supplying the diagonal vessel from surgery of 2019  Occluded sequential vein graft which had supplied the PDA, PLA 2, and obtuse marginal vessel from 2019 surgery.  Previous occlusions of prior vein grafts from 2013 surgery.  Echocardiogram 11/29/2018([facility]):  Technically  difficult study due to chest wall/lung interference  Ultrasound enhancing agent utilized to improve endocardial border  definition  Left ventricular hypertrophy - mild to moderate  Normal left ventricular systolic function, ejection fraction 55 to 60%  Degenerative mitral valve disease  Aortic sclerosis  Aortic regurgitation - mild  Dilated ascending aorta  Normal right ventricular systolic function  Assessment and Plan:  1. Multivessel CAD status post CABG in 2013 with concurrent repair of ascending aortic aneurysm at [facility].  He underwent redo operation in May 2019 in the setting of graft failure with placement of radial to the diagonal, SVG to OM, and SVG to PDA and PL 2.  Since that time he has had further graft failure and revascularization of the OM and RCA distributions.  LIMA and radial to LAD and diagonal are patent respectively.  He reports progressive exertional angina on medical therapy.  Has not been able to tolerate higher dose of Imdur beyond 30 mg twice daily due to lightheadedness.  We will try Ranexa 500 mg twice daily.  Continue present regimen including aspirin, Lopressor, Cardizem CD, and Lipitor.  Office follow-up scheduled.  May need to pursue a follow-up cardiac catheterization if symptoms do not improve to determine if there are any remaining PCI options at reasonable risk.  2.  Paroxysmal atrial fibrillation and also history of PSVT.  He has had no palpitations fortunately and continues on low-dose amiodarone along with Eliquis.  Medication Adjustments/Labs and Tests Ordered: Current medicines are reviewed at length with the patient today.  Concerns regarding medicines are outlined above.   Tests Ordered: No orders of the defined types were placed in this encounter.   Medication Changes: Meds ordered this encounter  Medications  . isosorbide mononitrate (IMDUR) 30 MG 24 hr tablet    Sig: Take 1 tablet (30 mg total) by mouth 2 (two) times daily.      Dispense:  180 tablet    Refill:  3  . ranolazine (RANEXA) 500 MG 12 hr tablet    Sig: Take 1 tablet (500 mg total) by mouth 2 (two) times daily.    Dispense:  60 tablet    Refill:  2    09/22/2019 NEW    Disposition:  Follow up 4 to 6 weeks in the [place] office.  Signed, Satira Sark, MD, [facility] 09/22/2019 1:53 PM    [place] at [place], [place], [place] [address] Phone: [phone]; Fax: [phone]

## 2019-09-22 ENCOUNTER — Encounter: Payer: Self-pay | Admitting: Cardiology

## 2019-09-22 ENCOUNTER — Ambulatory Visit (INDEPENDENT_AMBULATORY_CARE_PROVIDER_SITE_OTHER): Payer: Medicare Other | Admitting: Cardiology

## 2019-09-22 ENCOUNTER — Other Ambulatory Visit: Payer: Self-pay

## 2019-09-22 VITALS — BP 142/80 | HR 52 | Ht 67.0 in | Wt 191.0 lb

## 2019-09-22 DIAGNOSIS — I480 Paroxysmal atrial fibrillation: Secondary | ICD-10-CM | POA: Diagnosis not present

## 2019-09-22 DIAGNOSIS — I25119 Atherosclerotic heart disease of native coronary artery with unspecified angina pectoris: Secondary | ICD-10-CM | POA: Diagnosis not present

## 2019-09-22 DIAGNOSIS — E782 Mixed hyperlipidemia: Secondary | ICD-10-CM | POA: Diagnosis not present

## 2019-09-22 MED ORDER — RANOLAZINE ER 500 MG PO TB12: 500.0000 mg | ORAL_TABLET | Freq: Two times a day (BID) | ORAL | 2 refills | Status: DC

## 2019-09-22 MED ORDER — ISOSORBIDE MONONITRATE ER 30 MG PO TB24: 30.0000 mg | ORAL_TABLET | Freq: Two times a day (BID) | ORAL | 3 refills | Status: DC

## 2019-09-22 NOTE — Patient Instructions (Addendum)
Medication Instructions:    Your physician has recommended you make the following change in your medication:   Start ranexa 500 mg by mouth twice daily  Continue other medications the same  Labwork:  NONE  Testing/Procedures:  NONE  Follow-Up:  Your physician recommends that you schedule a follow-up appointment in: 4-6 weeks (office).  Any Other Special Instructions Will Be Listed Below (If Applicable).  If you need a refill on your cardiac medications before your next appointment, please call your pharmacy.

## 2019-10-01 ENCOUNTER — Telehealth: Payer: Self-pay | Admitting: Cardiology

## 2019-10-01 NOTE — Telephone Encounter (Signed)
Thank you for letting me know.  Please have him stop the Ranexa, could potentially be an interaction with amiodarone and diltiazem with slower heart rate.  This really does limit our options for treatment.  We should already have an office follow-up scheduled, may need to consider pursuing a follow-up diagnostic cardiac catheterization to see if there are any reasonable revascularization options.

## 2019-10-01 NOTE — Telephone Encounter (Signed)
Reports feeling very tired since starting ranexa 500 mg BID. Says his HR has been in 40's-50's & BP 100/50. Has not checked BP and HR today. Reports SOB and dizziness is worse since starting ranexa and chest pain is unchanged. 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 Advised that message would be sent to provider for recommendations.

## 2019-10-01 NOTE — Telephone Encounter (Signed)
Has questions about how to take medication

## 2019-10-01 NOTE — Telephone Encounter (Signed)
Patient informed and verbalized understanding of plan. 

## 2019-10-11 ENCOUNTER — Other Ambulatory Visit: Payer: Self-pay | Admitting: Cardiology

## 2019-10-11 ENCOUNTER — Ambulatory Visit: Admit: 2019-10-11 | Discharge: 2019-10-12 | Payer: MEDICARE | Attending: Family | Primary: Family

## 2019-10-11 DIAGNOSIS — R319 Hematuria, unspecified: Secondary | ICD-10-CM

## 2019-10-11 DIAGNOSIS — N419 Inflammatory disease of prostate, unspecified: Principal | ICD-10-CM

## 2019-10-11 DIAGNOSIS — N390 Urinary tract infection, site not specified: Secondary | ICD-10-CM

## 2019-10-11 DIAGNOSIS — R300 Dysuria: Principal | ICD-10-CM

## 2019-10-11 MED ORDER — CIPROFLOXACIN 500 MG TABLET
ORAL_TABLET | Freq: Two times a day (BID) | ORAL | 0 refills | 14.00000 days | Status: CP
Start: 2019-10-11 — End: 2019-10-25

## 2019-10-23 ENCOUNTER — Other Ambulatory Visit: Payer: Self-pay

## 2019-10-23 ENCOUNTER — Encounter: Payer: Self-pay | Admitting: Cardiology

## 2019-10-23 ENCOUNTER — Ambulatory Visit (INDEPENDENT_AMBULATORY_CARE_PROVIDER_SITE_OTHER): Payer: Medicare Other | Admitting: Cardiology

## 2019-10-23 VITALS — BP 140/80 | HR 56 | Ht 67.0 in | Wt 187.6 lb

## 2019-10-23 DIAGNOSIS — E782 Mixed hyperlipidemia: Secondary | ICD-10-CM | POA: Diagnosis not present

## 2019-10-23 DIAGNOSIS — I25119 Atherosclerotic heart disease of native coronary artery with unspecified angina pectoris: Secondary | ICD-10-CM | POA: Diagnosis not present

## 2019-10-23 DIAGNOSIS — I480 Paroxysmal atrial fibrillation: Secondary | ICD-10-CM | POA: Diagnosis not present

## 2019-10-23 MED ORDER — ISOSORBIDE MONONITRATE ER 30 MG PO TB24
60.0000 mg | ORAL_TABLET | Freq: Every morning | ORAL | 3 refills | Status: DC
Start: 2019-10-23 — End: 2020-06-20

## 2019-10-23 NOTE — Patient Instructions (Addendum)
Medication Instructions:   Your physician has recommended you make the following change in your medication:   Increase imdur (isosorbide mononitrate) to 60 mg in the morning and 30 mg in the evening  Continue other medications the same  Labwork:  NONE  Testing/Procedures:  NONE  Follow-Up:  Your physician recommends that you schedule a follow-up appointment in: 3 months (office).  Any Other Special Instructions Will Be Listed Below (If Applicable).  If you need a refill on your cardiac medications before your next appointment, please call your pharmacy.

## 2019-10-23 NOTE — Progress Notes (Signed)
Cardiology Office Note  Date: 10/23/2019   ID: Eric Borer., DOB 11/15/1940, MRN [id number]  PCP:  Celene Squibb, MD  Cardiologist:  Rozann Lesches, MD Electrophysiologist:  None   Chief Complaint  Patient presents with  . Cardiac follow-up    History of Present Illness: Eric Beltran. is a 79 y.o. male last seen in mid May.  He presents for a follow-up visit. He did not tolerate trial of Ranexa, mainly related to symptomatic bradycardia and lightheadedness.  I think his combination of current therapy limits its use.  Fortunately, he does not report any progressive angina at this time, we discussed try to increase his Imdur to 60 mg in the morning and 30 mg in the evening, otherwise continue with present regimen.  We did talk about considering a follow-up cardiac catheterization with eye toward possible revascularization strategies, realizing that this is likely to be limited.  This will be our next step if his symptoms worsen despite medication adjustments.  I personally reviewed his ECG today which shows sinus bradycardia with prolonged PR interval.  Past Medical History:  Diagnosis Date  . Arthritis   . Ascending aortic aneurysm [facility])    Status post repair 12/2011 at [facility]  . BPH (benign prostatic hyperplasia)   . Chronic diastolic CHF (congestive heart failure) ([place])   . Coronary atherosclerosis of native coronary artery    a. Multivessel status post prior stenting and ultimately CABG 12/2011 at [facility] with with LIMA to LAD, SVG to PLB, SVG to ramus, SVG to OM . b. Last cath 06/2014 (following ischemic nuc) -> medical therapy, no feasible way to revascularize LCx territory.  . Essential hypertension   . Mixed hyperlipidemia   . Myocardial infarction ([place])   . PAD (peripheral artery disease) ([place])   . PAF (paroxysmal atrial fibrillation) ([place])   . Supraventricular tachycardia [facility])     Past Surgical History:  Procedure Laterality Date  . ASCENDING AORTIC  ANEURYSM REPAIR  12/2011 [facility]   26 mm.Dacron graft  . CORONARY ARTERY BYPASS GRAFT  12/2011 [facility]   LIMA to LAD, SVG to PLB, SVG to ramus, SVG to OM  . CORONARY ARTERY BYPASS GRAFT N/A 08/06/2017   Procedure: REDO CORONARY ARTERY BYPASS GRAFTING (CABG) TIMES FOUR UTILIZING LEFT GREATER SAPHENOUS VEIN HAVESTED ENDOVASCULARLY, LEFT RADIAL ARTERY.  RIGHT AXILLARY CANNULATION;  Surgeon: Gaye Pollack, MD;  Location: [place];  Service: Open Heart Surgery;  Laterality: N/A;  . HERNIA REPAIR    . LEFT HEART CATH AND CORS/GRAFTS ANGIOGRAPHY N/A 07/11/2017   Procedure: LEFT HEART CATH AND CORS/GRAFTS ANGIOGRAPHY;  Surgeon: Troy Sine, MD;  Location: [place] CV LAB;  Service: Cardiovascular;  Laterality: N/A;  . LEFT HEART CATH AND CORS/GRAFTS ANGIOGRAPHY N/A 09/12/2018   Procedure: LEFT HEART CATH AND CORS/GRAFTS ANGIOGRAPHY;  Surgeon: Troy Sine, MD;  Location: [place] CV LAB;  Service: Cardiovascular;  Laterality: N/A;  . LEFT HEART CATHETERIZATION WITH CORONARY ANGIOGRAM N/A 06/18/2014   Procedure: LEFT HEART CATHETERIZATION WITH CORONARY ANGIOGRAM;  Surgeon: Blane Ohara, MD;  Location: [facility] CATH LAB;  Service: Cardiovascular;  Laterality: N/A;  . RADIAL ARTERY HARVEST Left 08/06/2017   Procedure: RADIAL ARTERY HARVEST;  Surgeon: Gaye Pollack, MD;  Location: [place];  Service: Open Heart Surgery;  Laterality: Left;  . TEE WITHOUT CARDIOVERSION N/A 08/06/2017   Procedure: TRANSESOPHAGEAL ECHOCARDIOGRAM (TEE);  Surgeon: Gaye Pollack, MD;  Location: [place];  Service: Open Heart Surgery;  Laterality: N/A;  . TOTAL HIP ARTHROPLASTY Right 02/07/2016   Procedure: RIGHT TOTAL HIP ARTHROPLASTY ANTERIOR APPROACH;  Surgeon: Mcarthur Rossetti, MD;  Location: [place];  Service: Orthopedics;  Laterality: Right;  . TRANSURETHRAL RESECTION OF PROSTATE      Current Outpatient Medications  Medication Sig Dispense Refill  . amiodarone (PACERONE) 200 MG tablet TAKE 1/2 TABLET BY MOUTH DAILY (cut in  half) 45 tablet 3  . Ascorbic Acid (VITAMIN C WITH ROSE HIPS) 500 MG tablet Take 500 mg by mouth daily.    [name] aspirin EC 81 MG tablet Take 81 mg by mouth daily.    [name] atorvastatin (LIPITOR) 40 MG tablet Take 1 tablet (40 mg total) by mouth at bedtime. 90 tablet 3  . diltiazem (CARDIZEM CD) 240 MG 24 hr capsule TAKE ONE CAPSULE BY MOUTH DAILY 90 capsule 1  . ELIQUIS 5 MG TABS tablet TAKE 1 TABLET BY MOUTH TWICE DAILY 60 tablet 3  . finasteride (PROSCAR) 5 MG tablet Take 5 mg by mouth daily.    . furosemide (LASIX) 20 MG tablet TAKE TWO TABLETS BY MOUTH DAILY 180 tablet 3  . isosorbide mononitrate (IMDUR) 30 MG 24 hr tablet Take 2 tablets (60 mg total) by mouth in the morning. & 30 mg (1 tablet) in the evening 270 tablet 3  . metoprolol tartrate (LOPRESSOR) 25 MG tablet TAKE 1 TABLET BY MOUTH TWICE DAILY 180 tablet 2  . Multiple Vitamin (MULTIVITAMIN) tablet Take 1 tablet by mouth at bedtime.     . nitroGLYCERIN (NITROSTAT) 0.4 MG SL tablet PLACE 1 TABLET UNDER TONGUE EVERY 5 MINUTES AS NEEDED FOR CHEST PAIN (Patient taking differently: Place 0.4 mg under the tongue every 5 (five) minutes as needed for chest pain. ) 25 tablet 3  . Omega-3 Fatty Acids (FISH OIL) 1000 MG CAPS Take 1 capsule by mouth daily.    . Tamsulosin HCl (FLOMAX) 0.4 MG CAPS Take 0.4 mg by mouth daily.     [name] zinc gluconate 50 MG tablet Take 50 mg by mouth daily.     No current facility-administered medications for this visit.   Allergies:  Patient has no known allergies.   ROS:   No palpitations or syncope.  Physical Exam: VS:  BP 140/80   Pulse (!) 56   Ht 5\' 7"  (1.702 m)   Wt 187 lb 9.6 oz (85.1 kg)   SpO2 97%   BMI 29.38 kg/m , BMI Body mass index is 29.38 kg/m.  Wt Readings from Last 3 Encounters:  10/23/19 187 lb 9.6 oz (85.1 kg)  09/22/19 191 lb (86.6 kg)  05/14/19 198 lb 12.8 oz (90.2 kg)    General: Patient appears comfortable at rest. HEENT: Conjunctiva and lids normal, wearing a mask. Neck: Supple, no  elevated JVP or carotid bruits, no thyromegaly. Lungs: Clear to auscultation, nonlabored breathing at rest. Cardiac: Regular rate and rhythm, no S3, 2/6 systolic and diastolic murmurs, no pericardial rub. Extremities: No pitting edema, distal pulses 2+.  ECG:  An ECG dated 05/14/2019 was personally reviewed today and demonstrated:  Sinus bradycardia with PACs, nonspecific ST-T changes.  Recent Labwork: No results found for requested labs within last 8760 hours.     Component Value Date/Time   CHOL 130 03/06/2017 0610   TRIG 262 (H) 03/06/2017 0610   HDL 28 (L) 03/06/2017 0610   CHOLHDL 4.6 03/06/2017 0610   VLDL 52 (H) 03/06/2017 0610   LDLCALC 50 03/06/2017 0610    Other Studies Reviewed Today:  Cardiac catheterization 09/12/2018:  Prox LAD-1 lesion is 80% stenosed.  Prox LAD-2 lesion is 70% stenosed.  Ost Cx to Prox Cx lesion is 100% stenosed.  Prox RCA to Mid RCA lesion is 30% stenosed.  Mid RCA lesion is 20% stenosed.  Post Atrio lesion is 95% stenosed.  Ost RPDA lesion is 80% stenosed.  1st RPLB lesion is 50% stenosed.  Origin lesion before RPDA is 100% stenosed.  Ost LAD lesion is 30% stenosed.  Severe native CAD with 30% ostial and 80% proximal proximal LAD stenosis before the first septal perforating artery and diagonal vessel with competitive filling of the mid LAD and mid diagonal vessel by way of the LIMA graft and radial artery graft.   Occluded ostial circumflex stent.  Very large dominant RCA with prior mid stent with 20% intimal hyperplasia, and previously noted 95% rock-like calcification immediately after the takeoff of the PDA vessel with new 80% ostial PDA stenosis, 50% PL1 stenosis, with very large PDA, PL 1 and PL 2 branches.  Patent LIMA to LAD from the initial surgery of 2013. There are very faint distal collaterals supplying the distal OM branch from the apical portion of the LAD.  Patent radial graft supplying the diagonal vessel from  surgery of 2019  Occluded sequential vein graft which had supplied the PDA, PLA 2, and obtuse marginal vessel from 2019 surgery.  Previous occlusions of prior vein grafts from 2013 surgery.  Echocardiogram 11/29/2018([facility]):  Technically difficult study due to chest wall/lung interference  Ultrasound enhancing agent utilized to improve endocardial border  definition  Left ventricular hypertrophy - mild to moderate  Normal left ventricular systolic function, ejection fraction 55 to 60%  Degenerative mitral valve disease  Aortic sclerosis  Aortic regurgitation - mild  Dilated ascending aorta  Normal right ventricular systolic function  Assessment and Plan:  1. Multivessel CAD status post CABG in 2013 with concurrent repair of ascending aortic aneurysm at [facility]. He underwent redo operation in May 2019 in the setting of graft failure with placement of radial to the diagonal, SVG to OM, and SVG to PDA and PL 2. Since that time he has had further graft failure and revascularization of the OM and RCA distributions. LIMA and radial to LAD and diagonal are patent respectively.  He did not tolerate trial of Ranexa.  Increase Imdur to 60 mg the morning and 30 mg in the evening with continued Cardizem CD, Lopressor, and Lipitor.  Follow-up in 3 months to review symptoms, we did discuss possibility of a follow-up cardiac catheterization.  2.  Paroxysmal atrial fibrillation and history of PSVT.  He continues on low-dose amiodarone and Eliquis.  Medication Adjustments/Labs and Tests Ordered: Current medicines are reviewed at length with the patient today.  Concerns regarding medicines are outlined above.   Tests Ordered: Orders Placed This Encounter  Procedures  . EKG 12-Lead    Medication Changes: Meds ordered this encounter  Medications  . isosorbide mononitrate (IMDUR) 30 MG 24 hr tablet    Sig: Take 2 tablets (60 mg total) by mouth in the morning. & 30 mg (1 tablet)  in the evening    Dispense:  270 tablet    Refill:  3    10/23/2019 dose increase    Disposition:  Follow up 3 months in the [place] office.  Signed, Satira Sark, MD, [facility] 10/23/2019 3:54 PM    [place] [facility] [place], [place], [place] [address] Phone: [phone];  Fax: [phone]

## 2019-11-02 ENCOUNTER — Ambulatory Visit: Payer: Self-pay

## 2019-11-02 ENCOUNTER — Ambulatory Visit: Payer: Medicare Other | Admitting: Orthopaedic Surgery

## 2019-11-02 ENCOUNTER — Encounter: Payer: Self-pay | Admitting: Orthopaedic Surgery

## 2019-11-02 ENCOUNTER — Other Ambulatory Visit: Payer: Self-pay

## 2019-11-02 DIAGNOSIS — Z96641 Presence of right artificial hip joint: Secondary | ICD-10-CM | POA: Diagnosis not present

## 2019-11-02 DIAGNOSIS — M5431 Sciatica, right side: Secondary | ICD-10-CM | POA: Diagnosis not present

## 2019-11-02 NOTE — Progress Notes (Signed)
Office Visit Note   Patient: Eric Beltran.           Date of Birth: 10/25/1940           MRN: [id number] Visit Date: 11/02/2019              Requested by: No referring provider defined for this encounter. PCP: Celene Squibb, MD   Assessment & Plan: Visit Diagnoses:  1. History of right hip replacement   2. Sciatica, right side     Plan: Given his right lower extremity radicular symptoms and based on his MRI findings from December 2019 lumbar spine, I am recommending an intervention by Dr. Ernestina Patches for an epidural steroid injection at most likely the right side L2 versus L5.  He is on Eliquis so he may need to stop this for a few days before this injection.  We will work on getting this scheduled.  Dr. Ernestina Patches then can have a follow-up appointment established with [place] several weeks after that and intervention.  All questions and concerns were answered and addressed.  Follow-Up Instructions: No follow-ups on file.   Orders:  Orders Placed This Encounter  Procedures  . XR Lumbar Spine 2-3 Views   No orders of the defined types were placed in this encounter.     Procedures: No procedures performed   Clinical Data: No additional findings.   Subjective: Chief Complaint  Patient presents with  . Right Hip - Pain  The patient is now seen before.  I did replace his right hip remotely.  We also ordered a MRI in December 2019 lumbar spine due to significant sciatic issues.  He had also seen Dr. Carloyn Manner at 1 point.  About 4 years ago he said he did have injections in the spine.  After I saw him in late 2019 with the MRI he said his symptoms had subsided somewhat.  They are back now again with pain running from his low back into his right thigh and then down to the knee.  It does not go to his feet.  He denies any weakness in his legs.  He denies any change in bowel bladder function.  He does not have to walk with an assistive device.  He is 79 years old.  He has had a history of DVTs in  the past and he is on chronic Eliquis twice daily.  HPI  Review of Systems He currently denies any headache, chest pain, shortness of breath, fever, chills, nausea, vomiting  Objective: Vital Signs: There were no vitals taken for this visit.  Physical Exam He is alert and orient x3 and in no acute distress Ortho Exam Examination of his hips shows his right hip is normal on exam.  He has excellent range of motion of both hips.  He has 5 out of 5 strength in both lower extremities.  His pain seems to be deep in the low back on the right side into the sciatic region and down to the knee.  He has good flexion-extension of the lumbar spine but it is painful.  Specialty Comments:  No specialty comments available.  Imaging: XR Lumbar Spine 2-3 Views  Result Date: 11/02/2019 2 views of the lumbar spine shows severe degenerative disc disease at L1-L2 and L2-L3.  There is also narrowing at L5-S1.  The MRI of his lumbar spine from December 2019 shows severe stenosis at L2-L3 and L5-S1 that is foraminal into the right side.   PMFS History: Patient  Active Problem List   Diagnosis Date Noted  . Coronary artery disease   . S/P CABG x 4 08/06/2017  . Coronary artery disease involving coronary bypass graft of native heart with angina pectoris ([place])   . BPH with obstruction/lower urinary tract symptoms 03/06/2017  . Angina pectoris ([place]) 03/05/2017  . Osteoarthritis of right hip 02/07/2016  . Status post total replacement of right hip 02/07/2016  . Abnormal cardiovascular function study   . Abnormal myocardial perfusion study 06/09/2014  . Cardiac murmur 05/21/2014  . Aneurysm of thoracic aorta ([place]) 04/13/2010  . Mixed hyperlipidemia 07/20/2009  . Essential hypertension, benign 07/20/2009  . Coronary artery disease involving native coronary artery of native heart with angina pectoris ([place]) 07/20/2009   Past Medical History:  Diagnosis Date  . Arthritis   . Ascending aortic aneurysm  [facility])    Status post repair 12/2011 at [facility]  . BPH (benign prostatic hyperplasia)   . Chronic diastolic CHF (congestive heart failure) ([place])   . Coronary atherosclerosis of native coronary artery    a. Multivessel status post prior stenting and ultimately CABG 12/2011 at [facility] with with LIMA to LAD, SVG to PLB, SVG to ramus, SVG to OM . b. Last cath 06/2014 (following ischemic nuc) -> medical therapy, no feasible way to revascularize LCx territory.  . Essential hypertension   . Mixed hyperlipidemia   . Myocardial infarction ([place])   . PAD (peripheral artery disease) ([place])   . PAF (paroxysmal atrial fibrillation) ([place])   . Supraventricular tachycardia ([facility])     Family History  Problem Relation Age of Onset  . Hypertension Father   . Parkinson's disease Father   . Hypertension Mother   . Lung cancer Sister     Past Surgical History:  Procedure Laterality Date  . ASCENDING AORTIC ANEURYSM REPAIR  12/2011 [facility]   26 mm.Dacron graft  . CORONARY ARTERY BYPASS GRAFT  12/2011 [facility]   LIMA to LAD, SVG to PLB, SVG to ramus, SVG to OM  . CORONARY ARTERY BYPASS GRAFT N/A 08/06/2017   Procedure: REDO CORONARY ARTERY BYPASS GRAFTING (CABG) TIMES FOUR UTILIZING LEFT GREATER SAPHENOUS VEIN HAVESTED ENDOVASCULARLY, LEFT RADIAL ARTERY.  RIGHT AXILLARY CANNULATION;  Surgeon: Gaye Pollack, MD;  Location: [place];  Service: Open Heart Surgery;  Laterality: N/A;  . HERNIA REPAIR    . LEFT HEART CATH AND CORS/GRAFTS ANGIOGRAPHY N/A 07/11/2017   Procedure: LEFT HEART CATH AND CORS/GRAFTS ANGIOGRAPHY;  Surgeon: Troy Sine, MD;  Location: [place] CV LAB;  Service: Cardiovascular;  Laterality: N/A;  . LEFT HEART CATH AND CORS/GRAFTS ANGIOGRAPHY N/A 09/12/2018   Procedure: LEFT HEART CATH AND CORS/GRAFTS ANGIOGRAPHY;  Surgeon: Troy Sine, MD;  Location: [place] CV LAB;  Service: Cardiovascular;  Laterality: N/A;  . LEFT HEART CATHETERIZATION WITH CORONARY ANGIOGRAM N/A 06/18/2014   Procedure: LEFT  HEART CATHETERIZATION WITH CORONARY ANGIOGRAM;  Surgeon: Blane Ohara, MD;  Location: [facility] CATH LAB;  Service: Cardiovascular;  Laterality: N/A;  . RADIAL ARTERY HARVEST Left 08/06/2017   Procedure: RADIAL ARTERY HARVEST;  Surgeon: Gaye Pollack, MD;  Location: [place];  Service: Open Heart Surgery;  Laterality: Left;  . TEE WITHOUT CARDIOVERSION N/A 08/06/2017   Procedure: TRANSESOPHAGEAL ECHOCARDIOGRAM (TEE);  Surgeon: Gaye Pollack, MD;  Location: [place];  Service: Open Heart Surgery;  Laterality: N/A;  . TOTAL HIP ARTHROPLASTY Right 02/07/2016   Procedure: RIGHT TOTAL HIP ARTHROPLASTY ANTERIOR APPROACH;  Surgeon: Mcarthur Rossetti, MD;  Location: [place];  Service: Orthopedics;  Laterality: Right;  . TRANSURETHRAL RESECTION OF PROSTATE     Social History   Occupational History  . Not on file  Tobacco Use  . Smoking status: Never Smoker  . Smokeless tobacco: Never Used  . Tobacco comment: tobacco use - no  Vaping Use  . Vaping Use: Never used  Substance and Sexual Activity  . Alcohol use: Yes    Alcohol/week: 0.0 standard drinks    Comment: Occasional  . Drug use: No  . Sexual activity: Not on file

## 2019-11-03 ENCOUNTER — Other Ambulatory Visit: Payer: Self-pay

## 2019-11-03 ENCOUNTER — Telehealth: Payer: Self-pay | Admitting: *Deleted

## 2019-11-03 DIAGNOSIS — M5431 Sciatica, right side: Secondary | ICD-10-CM

## 2019-11-03 DIAGNOSIS — M4807 Spinal stenosis, lumbosacral region: Secondary | ICD-10-CM

## 2019-11-03 NOTE — Telephone Encounter (Signed)
Yes, he may hold Eliquis for at least 48 hours prior to injection.

## 2019-11-19 DIAGNOSIS — B001 Herpesviral vesicular dermatitis: Principal | ICD-10-CM

## 2019-11-24 MED ORDER — VALACYCLOVIR 500 MG TABLET
ORAL_TABLET | Freq: Every day | ORAL | 5 refills | 30.00000 days | Status: CP
Start: 2019-11-24 — End: ?

## 2019-12-08 ENCOUNTER — Ambulatory Visit: Payer: Medicare Other | Admitting: Physical Medicine and Rehabilitation

## 2019-12-08 ENCOUNTER — Ambulatory Visit: Payer: Self-pay

## 2019-12-08 ENCOUNTER — Other Ambulatory Visit: Payer: Self-pay

## 2019-12-08 ENCOUNTER — Encounter: Payer: Self-pay | Admitting: Physical Medicine and Rehabilitation

## 2019-12-08 VITALS — BP 149/75 | HR 54

## 2019-12-08 DIAGNOSIS — M5416 Radiculopathy, lumbar region: Secondary | ICD-10-CM | POA: Diagnosis not present

## 2019-12-08 MED ORDER — METHYLPREDNISOLONE ACETATE 80 MG/ML IJ SUSP
40.0000 mg | Freq: Once | INTRAMUSCULAR | Status: AC
  Administered 2019-12-08: 40 mg

## 2019-12-08 NOTE — Progress Notes (Signed)
Pt states right lower back pain that travels to his leg and foot. Pt states laying on his back makes the pain worse. Pt states standing and sitting doesn't make it better. Pt states switching position helps sometime.   Numeric Pain Rating Scale and Functional Assessment Average Pain 0   In the last MONTH (on 0-10 scale) has pain interfered with the following?  1. General activity like being  able to carry out your everyday physical activities such as walking, climbing stairs, carrying groceries, or moving a chair?  Rating(4)   +Driver, +BT, -Dye Allergies.

## 2019-12-10 ENCOUNTER — Other Ambulatory Visit: Payer: Self-pay | Admitting: Cardiology

## 2019-12-23 ENCOUNTER — Ambulatory Visit: Admit: 2019-12-23 | Discharge: 2019-12-23 | Payer: MEDICARE | Attending: Vascular Surgery | Primary: Vascular Surgery

## 2019-12-23 ENCOUNTER — Ambulatory Visit: Admit: 2019-12-23 | Discharge: 2019-12-23 | Payer: MEDICARE

## 2019-12-23 DIAGNOSIS — I739 Peripheral vascular disease, unspecified: Principal | ICD-10-CM

## 2020-01-11 ENCOUNTER — Encounter: Payer: Self-pay | Admitting: Physical Medicine and Rehabilitation

## 2020-01-11 NOTE — Procedures (Signed)
Lumbosacral Transforaminal Epidural Steroid Injection - Sub-Pedicular Approach with Fluoroscopic Guidance  Patient: Eric Beltran.      Date of Birth: May 13, 1941 MRN: [id number] PCP: Celene Squibb, MD      Visit Date: 12/08/2019   Universal Protocol:    Date/Time: 12/08/2019  Consent Given By: the patient  Position: PRONE  Additional Comments: Vital signs were monitored before and after the procedure. Patient was prepped and draped in the usual sterile fashion. The correct patient, procedure, and site was verified.   Injection Procedure Details:  Procedure Site One Meds Administered:  Meds ordered this encounter  Medications  . methylPREDNISolone acetate (DEPO-MEDROL) injection 40 mg    Laterality: Right  Location/Site:  L5-S1  Needle size: 22 G  Needle type: Spinal  Needle Placement: Transforaminal  Findings:    -Comments: Excellent flow of contrast along the nerve, nerve root and into the epidural space.  Patient seemed to have particular bony stenosis here with stenotic flow. Procedure Details: After squaring off the end-plates to get a true AP view, the C-arm was positioned so that an oblique view of the foramen as noted above was visualized. The target area is just inferior to the "nose of the scotty dog" or sub pedicular. The soft tissues overlying this structure were infiltrated with 2-3 ml. of 1% Lidocaine without Epinephrine.  The spinal needle was inserted toward the target using a "trajectory" view along the fluoroscope beam.  Under AP and lateral visualization, the needle was advanced so it did not puncture dura and was located close the 6 O'Clock position of the pedical in AP tracterory. Biplanar projections were used to confirm position. Aspiration was confirmed to be negative for CSF and/or blood. A 1-2 ml. volume of Isovue-250 was injected and flow of contrast was noted at each level. Radiographs were obtained for documentation purposes.   After  attaining the desired flow of contrast documented above, a 0.5 to 1.0 ml test dose of 0.25% Marcaine was injected into each respective transforaminal space.  The patient was observed for 90 seconds post injection.  After no sensory deficits were reported, and normal lower extremity motor function was noted,   the above injectate was administered so that equal amounts of the injectate were placed at each foramen (level) into the transforaminal epidural space.   Additional Comments:  The patient tolerated the procedure well Dressing: 2 x 2 sterile gauze and Band-Aid    Post-procedure details: Patient was observed during the procedure. Post-procedure instructions were reviewed.  Patient left the clinic in stable condition.

## 2020-01-11 NOTE — Progress Notes (Signed)
Eric Beltran. - 79 y.o. male MRN [id number]  Date of birth: [birth year]  Office Visit Note: Visit Date: 12/08/2019 PCP: Celene Squibb, MD Referred by: Celene Squibb, MD  Subjective: Chief Complaint  Patient presents with  . Lower Back - Pain  . Right Knee - Pain  . Right Leg - Pain   HPI:  Eric Douthat. is a 79 y.o. male who comes in today at the request of Dr. Jean Rosenthal for planned Right L5-S1 Lumbar epidural steroid injection with fluoroscopic guidance.  The patient has failed conservative care including home exercise, medications, time and activity modification.  This injection will be diagnostic and hopefully therapeutic.  Please see requesting physician notes for further details and justification.  Patient had pretty classic L5 symptoms.  MRI does show a listhesis of L4 on L5 with some lateral recess narrowing and some foraminal narrowing.  He actually has more foraminal narrowing at L4 and depending on the relief with injection today would look at an L4 injection diagnostically.  ROS Otherwise per HPI.  Assessment & Plan: Visit Diagnoses:  1. Lumbar radiculopathy     Plan: No additional findings.   Meds & Orders:  Meds ordered this encounter  Medications  . methylPREDNISolone acetate (DEPO-MEDROL) injection 40 mg    Orders Placed This Encounter  Procedures  . XR C-ARM NO REPORT  . Epidural Steroid injection    Follow-up: Return if symptoms worsen or fail to improve.   Procedures: No procedures performed  Lumbosacral Transforaminal Epidural Steroid Injection - Sub-Pedicular Approach with Fluoroscopic Guidance  Patient: Eric Beltran.      Date of Birth: Mar 25, 1941 MRN: [id number] PCP: Celene Squibb, MD      Visit Date: 12/08/2019   Universal Protocol:    Date/Time: 12/08/2019  Consent Given By: the patient  Position: PRONE  Additional Comments: Vital signs were monitored before and after the procedure. Patient was prepped and draped  in the usual sterile fashion. The correct patient, procedure, and site was verified.   Injection Procedure Details:  Procedure Site One Meds Administered:  Meds ordered this encounter  Medications  . methylPREDNISolone acetate (DEPO-MEDROL) injection 40 mg    Laterality: Right  Location/Site:  L5-S1  Needle size: 22 G  Needle type: Spinal  Needle Placement: Transforaminal  Findings:    -Comments: Excellent flow of contrast along the nerve, nerve root and into the epidural space.  Patient seemed to have particular bony stenosis here with stenotic flow. Procedure Details: After squaring off the end-plates to get a true AP view, the C-arm was positioned so that an oblique view of the foramen as noted above was visualized. The target area is just inferior to the "nose of the scotty dog" or sub pedicular. The soft tissues overlying this structure were infiltrated with 2-3 ml. of 1% Lidocaine without Epinephrine.  The spinal needle was inserted toward the target using a "trajectory" view along the fluoroscope beam.  Under AP and lateral visualization, the needle was advanced so it did not puncture dura and was located close the 6 O'Clock position of the pedical in AP tracterory. Biplanar projections were used to confirm position. Aspiration was confirmed to be negative for CSF and/or blood. A 1-2 ml. volume of Isovue-250 was injected and flow of contrast was noted at each level. Radiographs were obtained for documentation purposes.   After attaining the desired flow of contrast documented above, a 0.5 to 1.0 ml test dose of  0.25% Marcaine was injected into each respective transforaminal space.  The patient was observed for 90 seconds post injection.  After no sensory deficits were reported, and normal lower extremity motor function was noted,   the above injectate was administered so that equal amounts of the injectate were placed at each foramen (level) into the transforaminal epidural  space.   Additional Comments:  The patient tolerated the procedure well Dressing: 2 x 2 sterile gauze and Band-Aid    Post-procedure details: Patient was observed during the procedure. Post-procedure instructions were reviewed.  Patient left the clinic in stable condition.      Clinical History: CLINICAL DATA:  79 year old male with mid to low back pain radiating to both legs down to the feet. Chronic but progressive symptoms with no known injury.  EXAM: MRI LUMBAR SPINE WITHOUT CONTRAST  TECHNIQUE: Multiplanar, multisequence MR imaging of the lumbar spine was performed. No intravenous contrast was administered.  COMPARISON:  Lumbar radiographs 04/14/2018. Report of [facility] lumbar MRI 03/02/2015 (no images available).  FINDINGS: Segmentation:  Normal on the comparison radiographs.  Alignment: Stable. Grade 1 anterolisthesis of L4 on L5 measuring 4-5 millimeters. Mild retrolisthesis of L2 on L3 and L1 on L2.  Vertebrae: Chronic degenerative endplate marrow signal changes at L1-L2 and L2-L3. No marrow edema or evidence of acute osseous abnormality. Intact visible sacrum and SI joints.  Conus medullaris and cauda equina: Conus extends to the T12-L1 level. No lower spinal cord or conus signal abnormality.  Paraspinal and other soft tissues: Negative.  Disc levels:  T11-T12: Mild to moderate facet hypertrophy and bilateral T11 foraminal stenosis.  T12-L1:  Negative.  L1-L2: Retrolisthesis with disc space loss, left eccentric circumferential disc bulge and endplate spurring. Mild facet hypertrophy. Moderate bilateral L1 foraminal stenosis.  L2-L3: Retrolisthesis with disc space loss and circumferential disc osteophyte complex. Superimposed small right paracentral disc protrusion (series 10, image 16). Mild to moderate facet and ligament flavum hypertrophy. Moderate spinal stenosis. Mild bilateral lateral recess stenosis (L3 nerve  levels). Moderate to severe bilateral L2 foraminal stenosis greater on the right.  L3-L4: Subtle anterolisthesis. Circumferential disc bulge. Moderate facet and ligament flavum hypertrophy. Mild bilateral lateral recess stenosis (L4 nerve levels). Moderate bilateral L3 foraminal stenosis. No significant spinal stenosis.  L4-L5: Grade 1 anterolisthesis with circumferential disc/pseudo disc. Severe facet hypertrophy. Moderate ligament flavum hypertrophy greater on the left. No significant spinal stenosis. Moderate left and mild right lateral recess stenosis (L5 nerve levels). Moderate to severe bilateral L4 foraminal stenosis, greater on the right.  L5-S1: Moderate facet hypertrophy. No spinal or lateral recess stenosis. Mild left and mild to moderate right L5 foraminal stenosis.  IMPRESSION: 1. Multilevel lumbar spondylolisthesis, most pronounced at L4-L5, with no acute osseous abnormality. 2. Moderate multifactorial spinal stenosis at L2-L3 where there is advanced disc and endplate degeneration. Moderate to severe bilateral L2 foraminal stenosis greater on the right. 3. Up to moderate L4-L5 lateral recess stenosis greater on the left and moderate to severe bilateral L4 foraminal stenosis. 4. Moderate bilateral L3 and right L5 neural foraminal stenosis.   Electronically Signed   By: Genevie Ann M.D.   On: 05/12/2018 14:32     Objective:  VS:  HT:    WT:   BMI:     BP:(!) 149/75  HR:54bpm  TEMP: ( )  RESP:  Physical Exam Constitutional:      General: He is not in acute distress.    Appearance: Normal appearance. He is not ill-appearing.  HENT:  Head: Normocephalic and atraumatic.     Right Ear: External ear normal.     Left Ear: External ear normal.  Eyes:     Extraocular Movements: Extraocular movements intact.  Cardiovascular:     Rate and Rhythm: Normal rate.     Pulses: Normal pulses.  Abdominal:     General: There is no distension.     Palpations:  Abdomen is soft.  Musculoskeletal:        General: No tenderness or signs of injury.     Right lower leg: No edema.     Left lower leg: No edema.     Comments: Patient has good distal strength without clonus.  Skin:    Findings: No erythema or rash.  Neurological:     General: No focal deficit present.     Mental Status: He is alert and oriented to person, place, and time.     Sensory: No sensory deficit.     Motor: No weakness or abnormal muscle tone.     Coordination: Coordination normal.  Psychiatric:        Mood and Affect: Mood normal.        Behavior: Behavior normal.      Imaging: No results found.

## 2020-01-12 ENCOUNTER — Ambulatory Visit: Admit: 2020-01-12 | Discharge: 2020-01-12 | Payer: MEDICARE

## 2020-01-12 ENCOUNTER — Ambulatory Visit
Admit: 2020-01-12 | Discharge: 2020-01-12 | Payer: MEDICARE | Attending: Nurse Practitioner | Primary: Nurse Practitioner

## 2020-01-12 DIAGNOSIS — R350 Frequency of micturition: Secondary | ICD-10-CM

## 2020-01-12 DIAGNOSIS — N2889 Other specified disorders of kidney and ureter: Principal | ICD-10-CM

## 2020-01-12 DIAGNOSIS — N401 Enlarged prostate with lower urinary tract symptoms: Secondary | ICD-10-CM

## 2020-01-12 MED ORDER — FINASTERIDE 5 MG TABLET
ORAL_TABLET | 12 refills | 0 days | Status: CP
Start: 2020-01-12 — End: ?

## 2020-01-21 ENCOUNTER — Other Ambulatory Visit: Payer: Self-pay | Admitting: Cardiology

## 2020-01-28 ENCOUNTER — Encounter: Payer: Self-pay | Admitting: Cardiology

## 2020-01-28 ENCOUNTER — Other Ambulatory Visit: Payer: Self-pay

## 2020-01-28 ENCOUNTER — Ambulatory Visit: Payer: Medicare Other | Admitting: Cardiology

## 2020-01-28 VITALS — BP 142/88 | HR 57 | Ht 67.0 in | Wt 193.0 lb

## 2020-01-28 DIAGNOSIS — Z01810 Encounter for preprocedural cardiovascular examination: Secondary | ICD-10-CM

## 2020-01-28 DIAGNOSIS — I25119 Atherosclerotic heart disease of native coronary artery with unspecified angina pectoris: Secondary | ICD-10-CM

## 2020-01-28 DIAGNOSIS — I480 Paroxysmal atrial fibrillation: Secondary | ICD-10-CM | POA: Diagnosis not present

## 2020-01-28 NOTE — Patient Instructions (Addendum)
Medication Instructions:   Your physician recommends that you continue on your current medications as directed. Please refer to the Current Medication list given to you today.  Labwork:  Your physician recommends that you return for lab work in: pre-cath lab work (BMET & CBC). This may be done at [facility] or [facility] ([place]) Monday-Friday from 8:00 am - 4:00 pm. No appointment is needed.  You will need a covid test-TBD after heart cath scheduled  Testing/Procedures: Your physician has requested that you have a cardiac catheterization. Cardiac catheterization is used to diagnose and/or treat various heart conditions. Doctors may recommend this procedure for a number of different reasons. The most common reason is to evaluate chest pain. Chest pain can be a symptom of coronary artery disease (CAD), and cardiac catheterization can show whether plaque is narrowing or blocking your heart's arteries. This procedure is also used to evaluate the valves, as well as measure the blood flow and oxygen levels in different parts of your heart. For further information please visit [email or web address]. Please follow instruction sheet, as given.---Please call our office with date/time that works for you so that we can get this scheduled.  Follow-Up:  Your physician recommends that you schedule a follow-up appointment in: 6 weeks.  Any Other Special Instructions Will Be Listed Below (If Applicable).  If you need a refill on your cardiac medications before your next appointment, please call your pharmacy.     [address] [place] [address] Dept: [phone] Loc: [place].  01/28/2020  You are scheduled for a Cardiac Catheterization on TBD.  1. Please arrive at the [facility] (Main Entrance A) at [facility]: [address] [name], [place] [address] at TBD (This time is two hours before your procedure to ensure your preparation). Free valet parking service is available.   Special note: Every effort is made to have your procedure done on time. Please understand that emergencies sometimes delay scheduled procedures.  2. Diet: Do not eat solid foods after midnight.  The patient may have clear liquids until 5am upon the day of the procedure.  3. Labs: You will need to have blood drawn on TBD. You do not need to be fasting. Lab work need to be done at [facility] or [facility] in [place].  4. Medication instructions in preparation for your procedure:   Contrast Allergy: No  Delete this med list prior to printing instructions for patient.*  Stop Eliquis 2 days before your cath TBD  Hold morning dose of furosemide on the day of your cath.  On the morning of your procedure, take your Aspirin and any morning medicines NOT listed above.  You may use sips of water.  5. Plan for one night stay--bring personal belongings. 6. Bring a current list of your medications and current insurance cards. 7. You MUST have a responsible person to drive you home. 8. Someone MUST be with you the first 24 hours after you arrive home or your discharge will be delayed. 9. Please wear clothes that are easy to get on and off and wear slip-on shoes.  Thank you for allowing [place] to care for you!   -- [facility]

## 2020-01-28 NOTE — Progress Notes (Signed)
Cardiology Office Note  Date: 01/28/2020   ID: Eric Borer., DOB May 11, 1941, MRN [id number]  PCP:  Celene Squibb, MD  Cardiologist:  Rozann Lesches, MD Electrophysiologist:  None   Chief Complaint  Patient presents with  . Cardiac follow-up    History of Present Illness: Eric Beltran. is a 79 y.o. male last seen in June.  He presents for a follow-up visit.  We have continued medical therapy as per discussion and last note, he continues to have regular angina symptoms and asked about the possibility of pursuing a diagnostic cardiac catheterization.  Last procedure was in May 2020, he has known graft disease with complex anatomy, and main question would be whether he has any viable revascularization options at this point.  I reviewed his medications which are listed below.  As discussed previously he did not tolerate Ranexa.  I personally reviewed his ECG today which shows sinus bradycardia with prolonged PR interval and right bundle branch block.  He does not describe any progressive palpitations or syncope.  Past Medical History:  Diagnosis Date  . Arthritis   . Ascending aortic aneurysm [facility])    Status post repair 12/2011 at [facility]  . BPH (benign prostatic hyperplasia)   . Chronic diastolic CHF (congestive heart failure) ([place])   . Coronary atherosclerosis of native coronary artery    a. Multivessel status post prior stenting and ultimately CABG 12/2011 at [facility] with with LIMA to LAD, SVG to PLB, SVG to ramus, SVG to OM . b. Last cath 06/2014 (following ischemic nuc) -> medical therapy, no feasible way to revascularize LCx territory.  . Essential hypertension   . Mixed hyperlipidemia   . Myocardial infarction ([place])   . PAD (peripheral artery disease) ([place])   . PAF (paroxysmal atrial fibrillation) ([place])   . Supraventricular tachycardia [facility])     Past Surgical History:  Procedure Laterality Date  . ASCENDING AORTIC ANEURYSM REPAIR  12/2011 [facility]   26 mm.Dacron graft   . CORONARY ARTERY BYPASS GRAFT  12/2011 [facility]   LIMA to LAD, SVG to PLB, SVG to ramus, SVG to OM  . CORONARY ARTERY BYPASS GRAFT N/A 08/06/2017   Procedure: REDO CORONARY ARTERY BYPASS GRAFTING (CABG) TIMES FOUR UTILIZING LEFT GREATER SAPHENOUS VEIN HAVESTED ENDOVASCULARLY, LEFT RADIAL ARTERY.  RIGHT AXILLARY CANNULATION;  Surgeon: Gaye Pollack, MD;  Location: [place];  Service: Open Heart Surgery;  Laterality: N/A;  . HERNIA REPAIR    . LEFT HEART CATH AND CORS/GRAFTS ANGIOGRAPHY N/A 07/11/2017   Procedure: LEFT HEART CATH AND CORS/GRAFTS ANGIOGRAPHY;  Surgeon: Troy Sine, MD;  Location: [place] CV LAB;  Service: Cardiovascular;  Laterality: N/A;  . LEFT HEART CATH AND CORS/GRAFTS ANGIOGRAPHY N/A 09/12/2018   Procedure: LEFT HEART CATH AND CORS/GRAFTS ANGIOGRAPHY;  Surgeon: Troy Sine, MD;  Location: [place] CV LAB;  Service: Cardiovascular;  Laterality: N/A;  . LEFT HEART CATHETERIZATION WITH CORONARY ANGIOGRAM N/A 06/18/2014   Procedure: LEFT HEART CATHETERIZATION WITH CORONARY ANGIOGRAM;  Surgeon: Blane Ohara, MD;  Location: [facility] CATH LAB;  Service: Cardiovascular;  Laterality: N/A;  . RADIAL ARTERY HARVEST Left 08/06/2017   Procedure: RADIAL ARTERY HARVEST;  Surgeon: Gaye Pollack, MD;  Location: [place];  Service: Open Heart Surgery;  Laterality: Left;  . TEE WITHOUT CARDIOVERSION N/A 08/06/2017   Procedure: TRANSESOPHAGEAL ECHOCARDIOGRAM (TEE);  Surgeon: Gaye Pollack, MD;  Location: [place];  Service: Open Heart Surgery;  Laterality: N/A;  . TOTAL HIP ARTHROPLASTY  Right 02/07/2016   Procedure: RIGHT TOTAL HIP ARTHROPLASTY ANTERIOR APPROACH;  Surgeon: Mcarthur Rossetti, MD;  Location: [place];  Service: Orthopedics;  Laterality: Right;  . TRANSURETHRAL RESECTION OF PROSTATE      Current Outpatient Medications  Medication Sig Dispense Refill  . amiodarone (PACERONE) 200 MG tablet TAKE 1/2 TABLET BY MOUTH DAILY (cut in half) 45 tablet 3  . Ascorbic Acid (VITAMIN C WITH  ROSE HIPS) 500 MG tablet Take 500 mg by mouth daily.    [name] aspirin EC 81 MG tablet Take 81 mg by mouth daily.    [name] atorvastatin (LIPITOR) 40 MG tablet Take 1 tablet (40 mg total) by mouth at bedtime. 90 tablet 3  . diltiazem (CARDIZEM CD) 240 MG 24 hr capsule TAKE ONE CAPSULE BY MOUTH DAILY 90 capsule 1  . ELIQUIS 5 MG TABS tablet TAKE 1 TABLET BY MOUTH TWICE DAILY 180 tablet 1  . finasteride (PROSCAR) 5 MG tablet Take 5 mg by mouth daily.    . furosemide (LASIX) 20 MG tablet TAKE TWO TABLETS BY MOUTH DAILY 180 tablet 3  . metoprolol tartrate (LOPRESSOR) 25 MG tablet TAKE 1 TABLET BY MOUTH TWICE DAILY 180 tablet 1  . Multiple Vitamin (MULTIVITAMIN) tablet Take 1 tablet by mouth at bedtime.     . nitroGLYCERIN (NITROSTAT) 0.4 MG SL tablet DISSOLVE 1 TABLET UNDER THE TONGUE EVERY 5 MINUTES AS NEEDED FOR CHEST PAIN. DO NOT EXCEED A TOTAL OF 3 DOSES IN 15 MINUTES. 25 tablet 5  . Omega-3 Fatty Acids (FISH OIL) 1000 MG CAPS Take 1 capsule by mouth daily.    . Tamsulosin HCl (FLOMAX) 0.4 MG CAPS Take 0.4 mg by mouth daily.     [name] zinc gluconate 50 MG tablet Take 50 mg by mouth daily.    . isosorbide mononitrate (IMDUR) 30 MG 24 hr tablet Take 2 tablets (60 mg total) by mouth in the morning. & 30 mg (1 tablet) in the evening 270 tablet 3   No current facility-administered medications for this visit.   Allergies:  Patient has no known allergies.   Social History: The patient  reports that he has never smoked. He has never used smokeless tobacco. He reports current alcohol use. He reports that he does not use drugs.   Family History: The patient's family history includes Hypertension in his father and mother; Lung cancer in his sister; Parkinson's disease in his father.   ROS:  No syncope.  Physical Exam: VS:  BP (!) 142/88   Pulse (!) 57   Ht 5\' 7"  (1.702 m)   Wt 193 lb (87.5 kg)   SpO2 98%   BMI 30.23 kg/m , BMI Body mass index is 30.23 kg/m.  Wt Readings from Last 3 Encounters:  01/28/20  193 lb (87.5 kg)  10/23/19 187 lb 9.6 oz (85.1 kg)  09/22/19 191 lb (86.6 kg)    General: Patient appears comfortable at rest. HEENT: Conjunctiva and lids normal, wearing a mask. Neck: Supple, no elevated JVP or carotid bruits, no thyromegaly. Lungs: Clear to auscultation, nonlabored breathing at rest. Cardiac: Regular rate and rhythm, no S3, 2/6 systolic and diastolic murmurs, no pericardial rub. Abdomen: Soft, nontender, bowel sounds present. Extremities: No pitting edema, distal pulses 2+.  ECG:  An ECG dated 10/23/2019 was personally reviewed today and demonstrated:  Sinus bradycardia with prolonged PR interval.  Recent Labwork:  January 2021: BUN 27, creatinine 1.21, AST 18, ALT 19, potassium 4.2, hemoglobin 14.1, platelets 233  Other Studies Reviewed  Today:  Cardiac catheterization 09/12/2018:  Prox LAD-1 lesion is 80% stenosed.  Prox LAD-2 lesion is 70% stenosed.  Ost Cx to Prox Cx lesion is 100% stenosed.  Prox RCA to Mid RCA lesion is 30% stenosed.  Mid RCA lesion is 20% stenosed.  Post Atrio lesion is 95% stenosed.  Ost RPDA lesion is 80% stenosed.  1st RPLB lesion is 50% stenosed.  Origin lesion before RPDA is 100% stenosed.  Ost LAD lesion is 30% stenosed.  Severe native CAD with 30% ostial and 80% proximal proximal LAD stenosis before the first septal perforating artery and diagonal vessel with competitive filling of the mid LAD and mid diagonal vessel by way of the LIMA graft and radial artery graft.   Occluded ostial circumflex stent.  Very large dominant RCA with prior mid stent with 20% intimal hyperplasia, and previously noted 95% rock-like calcification immediately after the takeoff of the PDA vessel with new 80% ostial PDA stenosis, 50% PL1 stenosis, with very large PDA, PL 1 and PL 2 branches.  Patent LIMA to LAD from the initial surgery of 2013. There are very faint distal collaterals supplying the distal OM branch from the apical portion of  the LAD.  Patent radial graft supplying the diagonal vessel from surgery of 2019  Occluded sequential vein graft which had supplied the PDA, PLA 2, and obtuse marginal vessel from 2019 surgery.  Previous occlusions of prior vein grafts from 2013 surgery.  Echocardiogram 11/29/2018([facility]):  Technically difficult study due to chest wall/lung interference  Ultrasound enhancing agent utilized to improve endocardial border  definition  Left ventricular hypertrophy - mild to moderate  Normal left ventricular systolic function, ejection fraction 55 to 60%  Degenerative mitral valve disease  Aortic sclerosis  Aortic regurgitation - mild  Dilated ascending aorta  Normal right ventricular systolic function  Assessment and Plan:  1.  Recurring angina despite optimized, tolerated medical therapy.  He has a history of multivessel CAD status post CABG in 2013 with concurrent repair of ascending aortic aneurysm at [facility]. He underwent redo operation in May 2019 in the setting of graft failure with placement of radial to the diagonal, SVG to OM, and SVG to PDA and PL 2. Since that time he has had further graft failure and revascularization of the OM and RCA distributions. LIMA and radial to LAD and diagonal are patent respectively.   We have discussed the risks and benefits of a diagnostic cardiac catheterization to determine if there are any viable revascularization options at this point.  He is in agreement to proceed and we will get this scheduled at his convenience.  Continue aspirin, Cardizem CD, Lopressor, Imdur, and as needed nitroglycerin.  2.  Paroxysmal atrial fibrillation and history of PSVT.  He continues on amiodarone as well as Eliquis, no reported bleeding problems.  Medication Adjustments/Labs and Tests Ordered: Current medicines are reviewed at length with the patient today.  Concerns regarding medicines are outlined above.   Tests Ordered: Orders Placed This  Encounter  Procedures  . Basic metabolic panel  . CBC  . EKG 12-Lead    Medication Changes: No orders of the defined types were placed in this encounter.   Disposition:  Follow up 6 weeks in the [place] office.  Signed, Satira Sark, MD, [facility] 01/28/2020 3:29 PM    [facility] [facility] [place], [place], [place] [address] Phone: [phone]; Fax: [phone]

## 2020-01-28 NOTE — H&P (View-Only) (Signed)
Cardiology Office Note  Date: 01/28/2020   ID: Eric Borer., DOB [birth year], MRN [id number]  PCP:  Celene Squibb, MD  Cardiologist:  Rozann Lesches, MD Electrophysiologist:  None   Chief Complaint  Patient presents with  . Cardiac follow-up    History of Present Illness: Eric Beltran. is a 79 y.o. male last seen in June.  He presents for a follow-up visit.  We have continued medical therapy as per discussion and last note, he continues to have regular angina symptoms and asked about the possibility of pursuing a diagnostic cardiac catheterization.  Last procedure was in May 2020, he has known graft disease with complex anatomy, and main question would be whether he has any viable revascularization options at this point.  I reviewed his medications which are listed below.  As discussed previously he did not tolerate Ranexa.  I personally reviewed his ECG today which shows sinus bradycardia with prolonged PR interval and right bundle branch block.  He does not describe any progressive palpitations or syncope.  Past Medical History:  Diagnosis Date  . Arthritis   . Ascending aortic aneurysm [facility])    Status post repair 12/2011 at [facility]  . BPH (benign prostatic hyperplasia)   . Chronic diastolic CHF (congestive heart failure) ([place])   . Coronary atherosclerosis of native coronary artery    a. Multivessel status post prior stenting and ultimately CABG 12/2011 at [facility] with with LIMA to LAD, SVG to PLB, SVG to ramus, SVG to OM . b. Last cath 06/2014 (following ischemic nuc) -> medical therapy, no feasible way to revascularize LCx territory.  . Essential hypertension   . Mixed hyperlipidemia   . Myocardial infarction ([place])   . PAD (peripheral artery disease) ([place])   . PAF (paroxysmal atrial fibrillation) ([place])   . Supraventricular tachycardia [facility])     Past Surgical History:  Procedure Laterality Date  . ASCENDING AORTIC ANEURYSM REPAIR  12/2011 [facility]   26 mm.Dacron graft   . CORONARY ARTERY BYPASS GRAFT  12/2011 [facility]   LIMA to LAD, SVG to PLB, SVG to ramus, SVG to OM  . CORONARY ARTERY BYPASS GRAFT N/A 08/06/2017   Procedure: REDO CORONARY ARTERY BYPASS GRAFTING (CABG) TIMES FOUR UTILIZING LEFT GREATER SAPHENOUS VEIN HAVESTED ENDOVASCULARLY, LEFT RADIAL ARTERY.  RIGHT AXILLARY CANNULATION;  Surgeon: Gaye Pollack, MD;  Location: [place];  Service: Open Heart Surgery;  Laterality: N/A;  . HERNIA REPAIR    . LEFT HEART CATH AND CORS/GRAFTS ANGIOGRAPHY N/A 07/11/2017   Procedure: LEFT HEART CATH AND CORS/GRAFTS ANGIOGRAPHY;  Surgeon: Troy Sine, MD;  Location: [place] CV LAB;  Service: Cardiovascular;  Laterality: N/A;  . LEFT HEART CATH AND CORS/GRAFTS ANGIOGRAPHY N/A 09/12/2018   Procedure: LEFT HEART CATH AND CORS/GRAFTS ANGIOGRAPHY;  Surgeon: Troy Sine, MD;  Location: [place] CV LAB;  Service: Cardiovascular;  Laterality: N/A;  . LEFT HEART CATHETERIZATION WITH CORONARY ANGIOGRAM N/A 06/18/2014   Procedure: LEFT HEART CATHETERIZATION WITH CORONARY ANGIOGRAM;  Surgeon: Blane Ohara, MD;  Location: [facility] CATH LAB;  Service: Cardiovascular;  Laterality: N/A;  . RADIAL ARTERY HARVEST Left 08/06/2017   Procedure: RADIAL ARTERY HARVEST;  Surgeon: Gaye Pollack, MD;  Location: [place];  Service: Open Heart Surgery;  Laterality: Left;  . TEE WITHOUT CARDIOVERSION N/A 08/06/2017   Procedure: TRANSESOPHAGEAL ECHOCARDIOGRAM (TEE);  Surgeon: Gaye Pollack, MD;  Location: [place];  Service: Open Heart Surgery;  Laterality: N/A;  . TOTAL HIP ARTHROPLASTY  Right 02/07/2016   Procedure: RIGHT TOTAL HIP ARTHROPLASTY ANTERIOR APPROACH;  Surgeon: Mcarthur Rossetti, MD;  Location: [place];  Service: Orthopedics;  Laterality: Right;  . TRANSURETHRAL RESECTION OF PROSTATE      Current Outpatient Medications  Medication Sig Dispense Refill  . amiodarone (PACERONE) 200 MG tablet TAKE 1/2 TABLET BY MOUTH DAILY (cut in half) 45 tablet 3  . Ascorbic Acid (VITAMIN C WITH  ROSE HIPS) 500 MG tablet Take 500 mg by mouth daily.    [name] aspirin EC 81 MG tablet Take 81 mg by mouth daily.    [name] atorvastatin (LIPITOR) 40 MG tablet Take 1 tablet (40 mg total) by mouth at bedtime. 90 tablet 3  . diltiazem (CARDIZEM CD) 240 MG 24 hr capsule TAKE ONE CAPSULE BY MOUTH DAILY 90 capsule 1  . ELIQUIS 5 MG TABS tablet TAKE 1 TABLET BY MOUTH TWICE DAILY 180 tablet 1  . finasteride (PROSCAR) 5 MG tablet Take 5 mg by mouth daily.    . furosemide (LASIX) 20 MG tablet TAKE TWO TABLETS BY MOUTH DAILY 180 tablet 3  . metoprolol tartrate (LOPRESSOR) 25 MG tablet TAKE 1 TABLET BY MOUTH TWICE DAILY 180 tablet 1  . Multiple Vitamin (MULTIVITAMIN) tablet Take 1 tablet by mouth at bedtime.     . nitroGLYCERIN (NITROSTAT) 0.4 MG SL tablet DISSOLVE 1 TABLET UNDER THE TONGUE EVERY 5 MINUTES AS NEEDED FOR CHEST PAIN. DO NOT EXCEED A TOTAL OF 3 DOSES IN 15 MINUTES. 25 tablet 5  . Omega-3 Fatty Acids (FISH OIL) 1000 MG CAPS Take 1 capsule by mouth daily.    . Tamsulosin HCl (FLOMAX) 0.4 MG CAPS Take 0.4 mg by mouth daily.     [name] zinc gluconate 50 MG tablet Take 50 mg by mouth daily.    . isosorbide mononitrate (IMDUR) 30 MG 24 hr tablet Take 2 tablets (60 mg total) by mouth in the morning. & 30 mg (1 tablet) in the evening 270 tablet 3   No current facility-administered medications for this visit.   Allergies:  Patient has no known allergies.   Social History: The patient  reports that he has never smoked. He has never used smokeless tobacco. He reports current alcohol use. He reports that he does not use drugs.   Family History: The patient's family history includes Hypertension in his father and mother; Lung cancer in his sister; Parkinson's disease in his father.   ROS:  No syncope.  Physical Exam: VS:  BP (!) 142/88   Pulse (!) 57   Ht 5\' 7"  (1.702 m)   Wt 193 lb (87.5 kg)   SpO2 98%   BMI 30.23 kg/m , BMI Body mass index is 30.23 kg/m.  Wt Readings from Last 3 Encounters:  01/28/20  193 lb (87.5 kg)  10/23/19 187 lb 9.6 oz (85.1 kg)  09/22/19 191 lb (86.6 kg)    General: Patient appears comfortable at rest. HEENT: Conjunctiva and lids normal, wearing a mask. Neck: Supple, no elevated JVP or carotid bruits, no thyromegaly. Lungs: Clear to auscultation, nonlabored breathing at rest. Cardiac: Regular rate and rhythm, no S3, 2/6 systolic and diastolic murmurs, no pericardial rub. Abdomen: Soft, nontender, bowel sounds present. Extremities: No pitting edema, distal pulses 2+.  ECG:  An ECG dated 10/23/2019 was personally reviewed today and demonstrated:  Sinus bradycardia with prolonged PR interval.  Recent Labwork:  January 2021: BUN 27, creatinine 1.21, AST 18, ALT 19, potassium 4.2, hemoglobin 14.1, platelets 233  Other Studies Reviewed  Today:  Cardiac catheterization 09/12/2018:  Prox LAD-1 lesion is 80% stenosed.  Prox LAD-2 lesion is 70% stenosed.  Ost Cx to Prox Cx lesion is 100% stenosed.  Prox RCA to Mid RCA lesion is 30% stenosed.  Mid RCA lesion is 20% stenosed.  Post Atrio lesion is 95% stenosed.  Ost RPDA lesion is 80% stenosed.  1st RPLB lesion is 50% stenosed.  Origin lesion before RPDA is 100% stenosed.  Ost LAD lesion is 30% stenosed.  Severe native CAD with 30% ostial and 80% proximal proximal LAD stenosis before the first septal perforating artery and diagonal vessel with competitive filling of the mid LAD and mid diagonal vessel by way of the LIMA graft and radial artery graft.   Occluded ostial circumflex stent.  Very large dominant RCA with prior mid stent with 20% intimal hyperplasia, and previously noted 95% rock-like calcification immediately after the takeoff of the PDA vessel with new 80% ostial PDA stenosis, 50% PL1 stenosis, with very large PDA, PL 1 and PL 2 branches.  Patent LIMA to LAD from the initial surgery of 2013. There are very faint distal collaterals supplying the distal OM branch from the apical portion of  the LAD.  Patent radial graft supplying the diagonal vessel from surgery of 2019  Occluded sequential vein graft which had supplied the PDA, PLA 2, and obtuse marginal vessel from 2019 surgery.  Previous occlusions of prior vein grafts from 2013 surgery.  Echocardiogram 11/29/2018([facility]):  Technically difficult study due to chest wall/lung interference  Ultrasound enhancing agent utilized to improve endocardial border  definition  Left ventricular hypertrophy - mild to moderate  Normal left ventricular systolic function, ejection fraction 55 to 60%  Degenerative mitral valve disease  Aortic sclerosis  Aortic regurgitation - mild  Dilated ascending aorta  Normal right ventricular systolic function  Assessment and Plan:  1.  Recurring angina despite optimized, tolerated medical therapy.  He has a history of multivessel CAD status post CABG in 2013 with concurrent repair of ascending aortic aneurysm at [facility]. He underwent redo operation in May 2019 in the setting of graft failure with placement of radial to the diagonal, SVG to OM, and SVG to PDA and PL 2. Since that time he has had further graft failure and revascularization of the OM and RCA distributions. LIMA and radial to LAD and diagonal are patent respectively.   We have discussed the risks and benefits of a diagnostic cardiac catheterization to determine if there are any viable revascularization options at this point.  He is in agreement to proceed and we will get this scheduled at his convenience.  Continue aspirin, Cardizem CD, Lopressor, Imdur, and as needed nitroglycerin.  2.  Paroxysmal atrial fibrillation and history of PSVT.  He continues on amiodarone as well as Eliquis, no reported bleeding problems.  Medication Adjustments/Labs and Tests Ordered: Current medicines are reviewed at length with the patient today.  Concerns regarding medicines are outlined above.   Tests Ordered: Orders Placed This  Encounter  Procedures  . Basic metabolic panel  . CBC  . EKG 12-Lead    Medication Changes: No orders of the defined types were placed in this encounter.   Disposition:  Follow up 6 weeks in the [place] office.  Signed, Satira Sark, MD, [facility] 01/28/2020 3:29 PM    [facility] [place], [place], [place] [address] Phone: [phone]; Fax: [phone]

## 2020-02-02 ENCOUNTER — Telehealth: Payer: Self-pay | Admitting: Cardiology

## 2020-02-02 ENCOUNTER — Telehealth: Payer: Self-pay | Admitting: *Deleted

## 2020-02-02 ENCOUNTER — Other Ambulatory Visit: Payer: Self-pay | Admitting: Cardiology

## 2020-02-02 DIAGNOSIS — I25119 Atherosclerotic heart disease of native coronary artery with unspecified angina pectoris: Secondary | ICD-10-CM

## 2020-02-02 MED ORDER — SODIUM CHLORIDE 0.9% FLUSH: 3.0000 mL | Freq: Two times a day (BID) | INTRAVENOUS | Status: DC

## 2020-02-02 NOTE — Telephone Encounter (Signed)
Orders completed

## 2020-02-02 NOTE — Telephone Encounter (Signed)
-----   Message from Merlene Laughter, RN sent at 01/28/2020  3:44 PM EDT ----- Regarding: need to arrange [facility] for recurrent angina & CAD-will need date for labs/covid test etc once cath set up. also need eliquis stop date-2 days before cath Patient to call back with date preference

## 2020-02-02 NOTE — Telephone Encounter (Signed)
Patient informed and verbalized understanding of plan. 

## 2020-02-02 NOTE — Telephone Encounter (Signed)
Pre-cert Verification for the following procedure    Left heart cath Wednesday, February 10, 2020 with Dr. Burt Knack @10 :00 am

## 2020-02-02 NOTE — Telephone Encounter (Addendum)
Left heart cath Wednesday, February 10, 2020 with Dr. Burt Knack @10 :00 am for arrival time of 8:00 am. Will hold eliquis 2 days prior starting on 02/08/2020 Covid test 02/08/2020 @3 :15 pm and will do lab work before covid test

## 2020-02-08 ENCOUNTER — Other Ambulatory Visit (HOSPITAL_COMMUNITY)
Admission: RE | Admit: 2020-02-08 | Discharge: 2020-02-08 | Disposition: A | Discharge status: Home / Self Care | Payer: Medicare Other | Source: Ambulatory Visit | Attending: Cardiology | Admitting: Cardiology

## 2020-02-08 ENCOUNTER — Other Ambulatory Visit: Payer: Self-pay

## 2020-02-08 DIAGNOSIS — I25119 Atherosclerotic heart disease of native coronary artery with unspecified angina pectoris: Secondary | ICD-10-CM | POA: Insufficient documentation

## 2020-02-08 DIAGNOSIS — Z20822 Contact with and (suspected) exposure to covid-19: Secondary | ICD-10-CM | POA: Insufficient documentation

## 2020-02-08 DIAGNOSIS — Z01810 Encounter for preprocedural cardiovascular examination: Secondary | ICD-10-CM | POA: Insufficient documentation

## 2020-02-08 DIAGNOSIS — I480 Paroxysmal atrial fibrillation: Secondary | ICD-10-CM | POA: Insufficient documentation

## 2020-02-08 DIAGNOSIS — Z01812 Encounter for preprocedural laboratory examination: Secondary | ICD-10-CM | POA: Diagnosis present

## 2020-02-08 LAB — BASIC METABOLIC PANEL
Anion gap: 9 (ref 5–15)
BUN: 19 mg/dL (ref 8–23)
CO2: 25 mmol/L (ref 22–32)
Calcium: 8.9 mg/dL (ref 8.9–10.3)
Chloride: 105 mmol/L (ref 98–111)
Creatinine, Ser: 1.23 mg/dL (ref 0.61–1.24)
GFR calc Af Amer: >60 mL/min (ref >60)
GFR calc non Af Amer: 56 mL/min — ABNORMAL LOW (ref >60)
Glucose, Bld: 92 mg/dL (ref 70–99)
Potassium: 3.9 mmol/L (ref 3.5–5.1)
Sodium: 139 mmol/L (ref 135–145)

## 2020-02-09 ENCOUNTER — Telehealth: Payer: Self-pay | Admitting: *Deleted

## 2020-02-09 LAB — SARS CORONAVIRUS 2 (TAT 6-24 HRS): SARS Coronavirus 2: NEGATIVE

## 2020-02-09 NOTE — Telephone Encounter (Signed)
Pt contacted pre-catheterization scheduled at [facility] for: Wednesday February 10, 2020 10 AM Verified arrival time and place: [facility]) at: 7:30 AM-needs CBC*   No solid food after midnight prior to cath, clear liquids until 5 AM day of procedure.  Hold: Eliquis-none 02/08/20 until post procedure Lasix-PM prior/AM of procedure-GFR 56  Except hold medications AM meds can be  taken pre-cath with sips of water including: ASA 81 mg   Confirmed patient has responsible adult to drive home post procedure and be with patient first 24 hours after arriving home: yes  You are allowed ONE visitor in the waiting room during the time you are at the hospital for your procedure. Both you and your visitor must wear a mask once you enter the hospital.       COVID-19 Pre-Screening Questions:  . In the past 14 days have you had a new cough, new headache, new nasal congestion, fever (100.4 or greater) unexplained body aches, new sore throat, or sudden loss of taste or sense of smell? no . In the past 14 days have you been around anyone with known Covid 19? no . Have you been vaccinated for COVID-19? Yes, see immunization history   Reviewed procedure/mask/visitor instructions, COVID-19 questions with patient.  *CBC ordered to be done yesterday at [place] not done and not able to add to specimen taken yesterday. Pt aware will need CBC on arrival to Short Stay tomorrow.

## 2020-02-10 ENCOUNTER — Encounter (HOSPITAL_COMMUNITY)
Admission: RE | Disposition: A | Discharge status: Home / Self Care | Payer: Self-pay | Source: Home / Self Care | Attending: Cardiovascular Disease

## 2020-02-10 ENCOUNTER — Other Ambulatory Visit: Payer: Self-pay

## 2020-02-10 ENCOUNTER — Encounter (HOSPITAL_COMMUNITY): Payer: Self-pay | Admitting: Cardiovascular Disease

## 2020-02-10 ENCOUNTER — Ambulatory Visit (HOSPITAL_COMMUNITY)
Admission: RE | Admit: 2020-02-10 | Discharge: 2020-02-11 | Disposition: A | Discharge status: Home / Self Care | Payer: Medicare Other | Attending: Cardiovascular Disease | Admitting: Cardiovascular Disease

## 2020-02-10 DIAGNOSIS — I739 Peripheral vascular disease, unspecified: Secondary | ICD-10-CM | POA: Diagnosis not present

## 2020-02-10 DIAGNOSIS — Z7901 Long term (current) use of anticoagulants: Secondary | ICD-10-CM | POA: Diagnosis not present

## 2020-02-10 DIAGNOSIS — I110 Hypertensive heart disease with heart failure: Secondary | ICD-10-CM | POA: Insufficient documentation

## 2020-02-10 DIAGNOSIS — Z79899 Other long term (current) drug therapy: Secondary | ICD-10-CM | POA: Diagnosis not present

## 2020-02-10 DIAGNOSIS — I252 Old myocardial infarction: Secondary | ICD-10-CM | POA: Diagnosis not present

## 2020-02-10 DIAGNOSIS — Z955 Presence of coronary angioplasty implant and graft: Secondary | ICD-10-CM | POA: Diagnosis not present

## 2020-02-10 DIAGNOSIS — I480 Paroxysmal atrial fibrillation: Secondary | ICD-10-CM | POA: Diagnosis not present

## 2020-02-10 DIAGNOSIS — I5032 Chronic diastolic (congestive) heart failure: Secondary | ICD-10-CM | POA: Insufficient documentation

## 2020-02-10 DIAGNOSIS — Z951 Presence of aortocoronary bypass graft: Secondary | ICD-10-CM | POA: Insufficient documentation

## 2020-02-10 DIAGNOSIS — E782 Mixed hyperlipidemia: Secondary | ICD-10-CM | POA: Diagnosis not present

## 2020-02-10 DIAGNOSIS — I471 Supraventricular tachycardia: Secondary | ICD-10-CM | POA: Diagnosis not present

## 2020-02-10 DIAGNOSIS — I25709 Atherosclerosis of coronary artery bypass graft(s), unspecified, with unspecified angina pectoris: Secondary | ICD-10-CM | POA: Diagnosis not present

## 2020-02-10 DIAGNOSIS — Z7982 Long term (current) use of aspirin: Secondary | ICD-10-CM | POA: Insufficient documentation

## 2020-02-10 DIAGNOSIS — I10 Essential (primary) hypertension: Secondary | ICD-10-CM | POA: Diagnosis present

## 2020-02-10 DIAGNOSIS — I25119 Atherosclerotic heart disease of native coronary artery with unspecified angina pectoris: Secondary | ICD-10-CM | POA: Diagnosis present

## 2020-02-10 DIAGNOSIS — I714 Abdominal aortic aneurysm, without rupture: Secondary | ICD-10-CM | POA: Insufficient documentation

## 2020-02-10 HISTORY — PX: CORONARY ATHERECTOMY: CATH118238

## 2020-02-10 HISTORY — PX: CORONARY STENT INTERVENTION: CATH118234

## 2020-02-10 HISTORY — PX: LEFT HEART CATH AND CORS/GRAFTS ANGIOGRAPHY: CATH118250

## 2020-02-10 LAB — POCT ACTIVATED CLOTTING TIME: Activated Clotting Time: 263 seconds

## 2020-02-10 LAB — CBC
HCT: 41.1 % (ref 39.0–52.0)
Hemoglobin: 13 g/dL (ref 13.0–17.0)
MCH: 28.3 pg (ref 26.0–34.0)
MCHC: 31.6 g/dL (ref 30.0–36.0)
MCV: 89.3 fL (ref 80.0–100.0)
Platelets: 231 10*3/uL (ref 150–400)
RBC: 4.6 MIL/uL (ref 4.22–5.81)
RDW: 15.7 % — ABNORMAL HIGH (ref 11.5–15.5)
WBC: 9.2 10*3/uL (ref 4.0–10.5)
nRBC: 0 % (ref 0.0–0.2)

## 2020-02-10 SURGERY — LEFT HEART CATH AND CORS/GRAFTS ANGIOGRAPHY
Anesthesia: LOCAL

## 2020-02-10 MED ORDER — LIDOCAINE HCL (PF) 1 % IJ SOLN
INTRAMUSCULAR | Status: DC | PRN
  Administered 2020-02-10: 14 mL

## 2020-02-10 MED ORDER — SODIUM CHLORIDE 0.9% FLUSH
3.0000 mL | Freq: Two times a day (BID) | INTRAVENOUS | Status: DC
  Administered 2020-02-10: 3 mL via INTRAVENOUS

## 2020-02-10 MED ORDER — ONDANSETRON HCL 4 MG/2ML IJ SOLN: 4.0000 mg | Freq: Four times a day (QID) | INTRAMUSCULAR | Status: DC | PRN

## 2020-02-10 MED ORDER — FENTANYL CITRATE (PF) 100 MCG/2ML IJ SOLN
INTRAMUSCULAR | Status: DC | PRN
Start: 2020-02-10 — End: 2020-02-10
  Administered 2020-02-10 (×3): 25 ug via INTRAVENOUS

## 2020-02-10 MED ORDER — FENTANYL CITRATE (PF) 100 MCG/2ML IJ SOLN
INTRAMUSCULAR | Status: AC
Start: 2020-02-10 — End: ?
  Filled 2020-02-10: qty 2

## 2020-02-10 MED ORDER — IOHEXOL 350 MG/ML SOLN
INTRAVENOUS | Status: AC
  Filled 2020-02-10: qty 1

## 2020-02-10 MED ORDER — MIDAZOLAM HCL 2 MG/2ML IJ SOLN
INTRAMUSCULAR | Status: AC
  Filled 2020-02-10: qty 2

## 2020-02-10 MED ORDER — BIVALIRUDIN TRIFLUOROACETATE 250 MG IV SOLR
INTRAVENOUS | Status: AC
  Filled 2020-02-10: qty 250

## 2020-02-10 MED ORDER — NITROGLYCERIN 1 MG/10 ML FOR IR/CATH LAB
INTRA_ARTERIAL | Status: AC
  Filled 2020-02-10: qty 10

## 2020-02-10 MED ORDER — SODIUM CHLORIDE 0.9 % IV SOLN: 250.0000 mL | INTRAVENOUS | Status: DC | PRN

## 2020-02-10 MED ORDER — SODIUM CHLORIDE 0.9 % WEIGHT BASED INFUSION
3.0000 mL/kg/h | INTRAVENOUS | Status: DC
  Administered 2020-02-10: 3 mL/kg/h via INTRAVENOUS

## 2020-02-10 MED ORDER — IOHEXOL 350 MG/ML SOLN
INTRAVENOUS | Status: DC | PRN
  Administered 2020-02-10: 170 mL

## 2020-02-10 MED ORDER — VIPERSLIDE LUBRICANT OPTIME: TOPICAL | Status: DC | PRN

## 2020-02-10 MED ORDER — ATORVASTATIN CALCIUM 40 MG PO TABS
40.0000 mg | ORAL_TABLET | Freq: Every day | ORAL | Status: DC
  Administered 2020-02-10: 40 mg via ORAL
  Filled 2020-02-10: qty 1

## 2020-02-10 MED ORDER — NITROGLYCERIN 0.4 MG SL SUBL: 0.4000 mg | SUBLINGUAL_TABLET | SUBLINGUAL | Status: DC | PRN

## 2020-02-10 MED ORDER — DILTIAZEM HCL ER COATED BEADS 240 MG PO CP24
240.0000 mg | ORAL_CAPSULE | Freq: Every day | ORAL | Status: DC
  Administered 2020-02-11: 240 mg via ORAL
  Filled 2020-02-10: qty 1

## 2020-02-10 MED ORDER — CLOPIDOGREL BISULFATE 75 MG PO TABS
75.0000 mg | ORAL_TABLET | Freq: Every day | ORAL | Status: DC
  Administered 2020-02-11: 75 mg via ORAL
  Filled 2020-02-10: qty 1

## 2020-02-10 MED ORDER — SODIUM CHLORIDE 0.9 % IV SOLN
INTRAVENOUS | Status: DC | PRN
  Administered 2020-02-10 (×2): 1.75 mg/kg/h via INTRAVENOUS

## 2020-02-10 MED ORDER — SODIUM CHLORIDE 0.9 % WEIGHT BASED INFUSION
1.0000 mL/kg/h | INTRAVENOUS | Status: AC
  Administered 2020-02-10: 1 mL/kg/h via INTRAVENOUS

## 2020-02-10 MED ORDER — APIXABAN 5 MG PO TABS
5.0000 mg | ORAL_TABLET | Freq: Two times a day (BID) | ORAL | Status: DC
  Administered 2020-02-11: 5 mg via ORAL
  Filled 2020-02-10: qty 1

## 2020-02-10 MED ORDER — ASPIRIN 81 MG PO CHEW: 81.0000 mg | CHEWABLE_TABLET | ORAL | Status: AC

## 2020-02-10 MED ORDER — METOPROLOL TARTRATE 25 MG PO TABS
25.0000 mg | ORAL_TABLET | Freq: Two times a day (BID) | ORAL | Status: DC
  Administered 2020-02-10: 25 mg via ORAL
  Filled 2020-02-10 (×2): qty 1

## 2020-02-10 MED ORDER — ASPIRIN EC 81 MG PO TBEC
81.0000 mg | DELAYED_RELEASE_TABLET | Freq: Every day | ORAL | Status: DC
  Administered 2020-02-10 – 2020-02-11 (×2): 81 mg via ORAL
  Filled 2020-02-10 (×2): qty 1

## 2020-02-10 MED ORDER — SODIUM CHLORIDE 0.9% FLUSH: 3.0000 mL | INTRAVENOUS | Status: DC | PRN

## 2020-02-10 MED ORDER — LIDOCAINE HCL (PF) 1 % IJ SOLN
INTRAMUSCULAR | Status: AC
  Filled 2020-02-10: qty 30

## 2020-02-10 MED ORDER — MIDAZOLAM HCL 2 MG/2ML IJ SOLN
INTRAMUSCULAR | Status: DC | PRN
  Administered 2020-02-10 (×3): 1 mg via INTRAVENOUS

## 2020-02-10 MED ORDER — HEPARIN SODIUM (PORCINE) 1000 UNIT/ML IJ SOLN
INTRAMUSCULAR | Status: AC
  Filled 2020-02-10: qty 1

## 2020-02-10 MED ORDER — BIVALIRUDIN BOLUS VIA INFUSION - CUPID
INTRAVENOUS | Status: DC | PRN
  Administered 2020-02-10: 25.71 mg via INTRAVENOUS

## 2020-02-10 MED ORDER — CLOPIDOGREL BISULFATE 75 MG PO TABS
600.0000 mg | ORAL_TABLET | Freq: Once | ORAL | Status: AC
  Administered 2020-02-10: 600 mg via ORAL
  Filled 2020-02-10: qty 8

## 2020-02-10 MED ORDER — HEPARIN (PORCINE) IN NACL 1000-0.9 UT/500ML-% IV SOLN
INTRAVENOUS | Status: DC | PRN
  Administered 2020-02-10 (×2): 500 mL

## 2020-02-10 MED ORDER — TAMSULOSIN HCL 0.4 MG PO CAPS
0.4000 mg | ORAL_CAPSULE | Freq: Every day | ORAL | Status: DC
  Administered 2020-02-10: 0.4 mg via ORAL
  Filled 2020-02-10: qty 1

## 2020-02-10 MED ORDER — ACETAMINOPHEN 325 MG PO TABS: 650.0000 mg | ORAL_TABLET | ORAL | Status: DC | PRN

## 2020-02-10 MED ORDER — SODIUM CHLORIDE 0.9 % WEIGHT BASED INFUSION: 1.0000 mL/kg/h | INTRAVENOUS | Status: DC

## 2020-02-10 MED ORDER — FUROSEMIDE 20 MG PO TABS
20.0000 mg | ORAL_TABLET | Freq: Two times a day (BID) | ORAL | Status: DC
  Administered 2020-02-10 – 2020-02-11 (×2): 20 mg via ORAL
  Filled 2020-02-10 (×2): qty 1

## 2020-02-10 MED ORDER — FINASTERIDE 5 MG PO TABS
5.0000 mg | ORAL_TABLET | Freq: Every day | ORAL | Status: DC
  Administered 2020-02-10: 5 mg via ORAL
  Filled 2020-02-10: qty 1

## 2020-02-10 MED ORDER — SODIUM CHLORIDE 0.9 % IV SOLN
5.0000 mg/kg | INTRAVENOUS | Status: AC
  Filled 2020-02-10: qty 17.14

## 2020-02-10 MED ORDER — HYDRALAZINE HCL 20 MG/ML IJ SOLN: 10.0000 mg | INTRAMUSCULAR | Status: AC | PRN

## 2020-02-10 MED ORDER — LABETALOL HCL 5 MG/ML IV SOLN: 10.0000 mg | INTRAVENOUS | Status: AC | PRN

## 2020-02-10 MED ORDER — AMIODARONE HCL 100 MG PO TABS
100.0000 mg | ORAL_TABLET | Freq: Every day | ORAL | Status: DC
  Administered 2020-02-11: 100 mg via ORAL
  Filled 2020-02-10: qty 1

## 2020-02-10 MED ORDER — NITROGLYCERIN 1 MG/10 ML FOR IR/CATH LAB
INTRA_ARTERIAL | Status: DC | PRN
  Administered 2020-02-10 (×3): 200 ug via INTRACORONARY

## 2020-02-10 MED ORDER — ISOSORBIDE MONONITRATE ER 30 MG PO TB24
30.0000 mg | ORAL_TABLET | Freq: Every evening | ORAL | Status: DC
  Administered 2020-02-10: 30 mg via ORAL
  Filled 2020-02-10: qty 1

## 2020-02-10 MED ORDER — HEPARIN (PORCINE) IN NACL 1000-0.9 UT/500ML-% IV SOLN
INTRAVENOUS | Status: AC
  Filled 2020-02-10: qty 1000

## 2020-02-10 MED ORDER — ASPIRIN 81 MG PO CHEW
CHEWABLE_TABLET | ORAL | Status: AC
  Administered 2020-02-10: 81 mg via ORAL
  Filled 2020-02-10: qty 1

## 2020-02-10 MED ORDER — ISOSORBIDE MONONITRATE ER 60 MG PO TB24
60.0000 mg | ORAL_TABLET | Freq: Every day | ORAL | Status: DC
  Administered 2020-02-11: 60 mg via ORAL
  Filled 2020-02-10: qty 1

## 2020-02-10 MED ORDER — ISOSORBIDE MONONITRATE ER 30 MG PO TB24: 30.0000 mg | ORAL_TABLET | ORAL | Status: DC

## 2020-02-10 MED ORDER — SODIUM CHLORIDE 0.9 % IV SOLN
INTRAVENOUS | Status: DC | PRN
  Administered 2020-02-10: 428.5 mg via INTRAVENOUS

## 2020-02-10 SURGICAL SUPPLY — 39 items
BALLN SAPPHIRE 2.0X12 (BALLOONS) ×4
BALLN SAPPHIRE 2.5X15 (BALLOONS) ×2
BALLN SAPPHIRE ~~LOC~~ 2.75X12 (BALLOONS) ×1 IMPLANT
BALLN SAPPHIRE ~~LOC~~ 3.0X12 (BALLOONS) ×1 IMPLANT
BALLOON SAPPHIRE 2.0X12 (BALLOONS) IMPLANT
BALLOON SAPPHIRE 2.5X15 (BALLOONS) IMPLANT
CATH EXPO 5F MPA-1 (CATHETERS) ×1 IMPLANT
CATH INFINITI 5 FR IM (CATHETERS) ×1 IMPLANT
CATH INFINITI 5 FR LCB (CATHETERS) ×1 IMPLANT
CATH INFINITI 5FR MULTPACK ANG (CATHETERS) ×1 IMPLANT
CATH TELEPORT (CATHETERS) ×1 IMPLANT
CATH TELESCOPE 6F GEC (CATHETERS) ×1 IMPLANT
CATH VISTA GUIDE 6FR AL1 (CATHETERS) ×1 IMPLANT
CLOSURE PERCLOSE PROSTYLE (VASCULAR PRODUCTS) ×1 IMPLANT
CROWN DIAMONDBACK CLASSIC 1.25 (BURR) ×1 IMPLANT
ELECT DEFIB PAD ADLT CADENCE (PAD) ×1 IMPLANT
GUIDEWIRE INQWIRE 1.5J.035X260 (WIRE) IMPLANT
INQWIRE 1.5J .035X260CM (WIRE) ×2
KIT ENCORE 26 ADVANTAGE (KITS) ×1 IMPLANT
KIT HEART LEFT (KITS) ×2 IMPLANT
KIT HEMO VALVE WATCHDOG (MISCELLANEOUS) ×1 IMPLANT
KIT MICROPUNCTURE NIT STIFF (SHEATH) ×1 IMPLANT
LUBRICANT VIPERSLIDE CORONARY (MISCELLANEOUS) ×1 IMPLANT
PACK CARDIAC CATHETERIZATION (CUSTOM PROCEDURE TRAY) ×2 IMPLANT
SHEATH PINNACLE 6F 10CM (SHEATH) ×1 IMPLANT
SHEATH PROBE COVER 6X72 (BAG) ×1 IMPLANT
STENT RESOLUTE ONYX 2.75X30 (Permanent Stent) ×1 IMPLANT
STENT RESOLUTE ONYX 3.5X12 (Permanent Stent) ×1 IMPLANT
TRANSDUCER W/STOPCOCK (MISCELLANEOUS) ×2 IMPLANT
TUBING CIL FLEX 10 FLL-RA (TUBING) ×2 IMPLANT
WIRE ASAHI GRAND SLAM 180CM (WIRE) IMPLANT
WIRE COUGAR XT STRL 190CM (WIRE) ×2 IMPLANT
WIRE COUGAR XT STRL 300CM (WIRE) ×1 IMPLANT
WIRE EMERALD 3MM-J .035X150CM (WIRE) ×1 IMPLANT
WIRE HI TORQ VERSACORE-J 145CM (WIRE) ×1 IMPLANT
WIRE HI TORQ WHISPER MS 190CM (WIRE) ×1 IMPLANT
WIRE HI TORQ WHISPER MS 300CM (WIRE) ×1 IMPLANT
WIRE MAILMAN 300CM (WIRE) ×1 IMPLANT
WIRE VIPERWIRE COR FLEX .012 (WIRE) ×1 IMPLANT

## 2020-02-10 NOTE — Interval H&P Note (Signed)
Cath Lab Visit (complete for each Cath Lab visit)  Clinical Evaluation Leading to the Procedure:   ACS: No.  Non-ACS:    Anginal Classification: CCS III  Anti-ischemic medical therapy: Maximal Therapy (2 or more classes of medications)  Non-Invasive Test Results: No non-invasive testing performed  Prior CABG: Previous CABG      History and Physical Interval Note:  02/10/2020 9:36 AM  Eric Beltran.  has presented today for surgery, with the diagnosis of Angina and CAD.  The various methods of treatment have been discussed with the patient and family. After consideration of risks, benefits and other options for treatment, the patient has consented to  Procedure(s): LEFT HEART CATH AND CORS/GRAFTS ANGIOGRAPHY (N/A) as a surgical intervention.  The patient's history has been reviewed, patient examined, no change in status, stable for surgery.  I have reviewed the patient's chart and labs.  Questions were answered to the patient's satisfaction.     Eric Beltran

## 2020-02-11 ENCOUNTER — Encounter (HOSPITAL_COMMUNITY): Payer: Self-pay | Admitting: Cardiovascular Disease

## 2020-02-11 DIAGNOSIS — Z951 Presence of aortocoronary bypass graft: Secondary | ICD-10-CM | POA: Diagnosis not present

## 2020-02-11 DIAGNOSIS — I25709 Atherosclerosis of coronary artery bypass graft(s), unspecified, with unspecified angina pectoris: Secondary | ICD-10-CM | POA: Diagnosis not present

## 2020-02-11 DIAGNOSIS — I110 Hypertensive heart disease with heart failure: Secondary | ICD-10-CM | POA: Diagnosis not present

## 2020-02-11 DIAGNOSIS — Z955 Presence of coronary angioplasty implant and graft: Secondary | ICD-10-CM | POA: Diagnosis not present

## 2020-02-11 DIAGNOSIS — I25119 Atherosclerotic heart disease of native coronary artery with unspecified angina pectoris: Secondary | ICD-10-CM | POA: Diagnosis not present

## 2020-02-11 LAB — BASIC METABOLIC PANEL
Anion gap: 10 (ref 5–15)
BUN: 12 mg/dL (ref 8–23)
CO2: 21 mmol/L — ABNORMAL LOW (ref 22–32)
Calcium: 8.7 mg/dL — ABNORMAL LOW (ref 8.9–10.3)
Chloride: 107 mmol/L (ref 98–111)
Creatinine, Ser: 1.09 mg/dL (ref 0.61–1.24)
GFR calc Af Amer: >60 mL/min (ref >60)
GFR calc non Af Amer: >60 mL/min (ref >60)
Glucose, Bld: 100 mg/dL — ABNORMAL HIGH (ref 70–99)
Potassium: 3.7 mmol/L (ref 3.5–5.1)
Sodium: 138 mmol/L (ref 135–145)

## 2020-02-11 LAB — CBC
HCT: 40.5 % (ref 39.0–52.0)
Hemoglobin: 13.3 g/dL (ref 13.0–17.0)
MCH: 29.1 pg (ref 26.0–34.0)
MCHC: 32.8 g/dL (ref 30.0–36.0)
MCV: 88.6 fL (ref 80.0–100.0)
Platelets: 207 10*3/uL (ref 150–400)
RBC: 4.57 MIL/uL (ref 4.22–5.81)
RDW: 15.7 % — ABNORMAL HIGH (ref 11.5–15.5)
WBC: 13.4 10*3/uL — ABNORMAL HIGH (ref 4.0–10.5)
nRBC: 0 % (ref 0.0–0.2)

## 2020-02-11 MED ORDER — ATORVASTATIN CALCIUM 80 MG PO TABS: 80.0000 mg | ORAL_TABLET | Freq: Every day | ORAL | 6 refills | Status: DC

## 2020-02-11 MED ORDER — CLOPIDOGREL BISULFATE 75 MG PO TABS: 75.0000 mg | ORAL_TABLET | Freq: Every day | ORAL | 6 refills | Status: DC

## 2020-02-11 MED FILL — CLOPIDOGREL 75 MG TABLET: 75 | 30 days supply | Qty: 30 | Fill #0

## 2020-02-11 MED FILL — ATORVASTATIN CALCIUM 80 MG: 80 | 30 days supply | Qty: 30 | Fill #0

## 2020-02-11 NOTE — Discharge Summary (Signed)
Discharge Summary    Patient ID: Eric Beltran. MRN: [id number]; DOB: [birth year]  Admit date: 02/10/2020 Discharge date: 02/11/2020  Primary Care Provider: Celene Squibb, MD  Primary Cardiologist: Rozann Lesches, MD   Discharge Diagnoses    Active Problems:   Coronary artery disease involving native coronary artery of native heart with angina pectoris [facility])   Coronary artery disease involving coronary bypass graft of native heart with angina pectoris ([place])   PAF  PSVT   Chronic diastolic CHF  HTN  HLD   Diagnostic Studies/Procedures     CORONARY STENT INTERVENTION  CORONARY ATHERECTOMY  LEFT HEART CATH AND CORS/GRAFTS ANGIOGRAPHY  Conclusion    A drug-eluting stent was successfully placed using a STENT RESOLUTE ONYX 3.5X12.  Post intervention, there is a 0% residual stenosis.  Post intervention, there is a 0% residual stenosis.  A drug-eluting stent was successfully placed using a STENT RESOLUTE ONYX 2.75X30.  Post intervention, there is a 0% residual stenosis.  Balloon angioplasty was performed using a BALLOON SAPPHIRE 2.5X15.  Post intervention, there is a 50% residual stenosis.  Origin to Prox Graft lesion is 100% stenosed.   1.  Severe native multivessel coronary artery disease with total occlusion of the left circumflex ostium, severe stenosis of the proximal LAD, and severe complex calcific stenosis of the distal RCA involving the PDA and PLA branches 2.  Status post aortocoronary bypass surgery with continued patency of the LIMA to LAD and occlusion of all other grafts 3.  Successful PCI of the native RCA distal bifurcation using orbital atherectomy, main branch stenting, and sidebranch balloon angioplasty.  Recommend: Aspirin 81 mg daily x1 month, clopidogrel 75 mg daily at least 6 months without interruption, resume apixaban tomorrow morning.   Diagnostic Dominance: Right  Intervention     History of Present Illness     Eric Beltran. is a 79 y.o. male with hx of CAD s/p CABG, HTN, HLD, PAF on Eliquis, PSVT, chronic diastolic CHF, ascending aortic aneurysm s/p repair at [facility] 2013 presented for outpatient cath.   He has a history of multivessel CAD status post CABG in 2013 with concurrent repair of ascending aortic aneurysm at [facility]. He underwent redo operation in May 2019 in the setting of graft failure with placement of radial to the diagonal, SVG to OM, and SVG to PDA and PL 2. Since that time he has had further graft failure and revascularization of the OM and RCA distributions. LIMA and radial to LAD and diagonal are patent respectively.    Seen by Dr. Domenic Polite 01/28/20. Complain of recurrent angina and cardiac cath arranged.   Hospital Course     Consultants: None  Presented for outpatient cath showing patent LIMA to LAD and occlusion of all other graft. Severe multivessel CAD S/p successful PCI of the native RCA distal bifurcation using orbital atherectomy, main branch stenting, and sidebranch balloon angioplasty as above. No overnight complicates. Groin site stable. Renal function normal. Ambulated well. Plan for triple therapy for one month and then stop ASA. Interrupted Plavix for at least 6 months.   Patient reported high lipid profile (unavailble for review). He reported higher dose statin allergy but does not remember name of drug. He is willing to try Lipitor at 80mg  qd (up from 40mg  qd). Lipid panel as outpatient.   Can consider reducing Imdur as outpatient.  The patient been seen by Dr. Audie Box today and deemed ready for discharge home. All follow-up appointments have been  scheduled. Discharge medications are listed below.   Did the patient have an acute coronary syndrome (MI, NSTEMI, STEMI, etc) this admission?:  No                               Did the patient have a percutaneous coronary intervention (stent / angioplasty)?:  Yes.     Cath/PCI Registry Performance & Quality  Measures: 1. Aspirin prescribed? - Yes 2. ADP Receptor Inhibitor (Plavix/Clopidogrel, Brilinta/Ticagrelor or Effient/Prasugrel) prescribed (includes medically managed patients)? - Yes 3. High Intensity Statin (Lipitor 40-80mg  or Crestor 20-40mg ) prescribed? - Yes 4. For EF <40%, was ACEI/ARB prescribed? - Not Applicable (EF >/= 16%) 5. For EF <40%, Aldosterone Antagonist (Spironolactone or Eplerenone) prescribed? - Not Applicable (EF >/= 10%) 6. Cardiac Rehab Phase II ordered? - Yes   _____________  Discharge Vitals Blood pressure (!) 176/77, pulse (!) 55, temperature 98.9 F (37.2 C), temperature source Oral, resp. rate 16, height 5\' 7"  (1.702 m), weight 87.2 kg, SpO2 95 %.  Filed Weights   02/10/20 0735 02/11/20 0417  Weight: 85.7 kg 87.2 kg   Physical Exam Constitutional:      Appearance: Normal appearance.  HENT:     Head: Normocephalic.     Nose: Nose normal.  Eyes:     Extraocular Movements: Extraocular movements intact.     Pupils: Pupils are equal, round, and reactive to light.  Cardiovascular:     Rate and Rhythm: Normal rate and regular rhythm.     Pulses: Normal pulses.     Heart sounds: Normal heart sounds.     Comments: Right groin cath site without hematoma Pulmonary:     Effort: Pulmonary effort is normal.     Breath sounds: Normal breath sounds.  Abdominal:     General: Abdomen is flat. Bowel sounds are normal.     Palpations: Abdomen is soft.  Musculoskeletal:        General: Normal range of motion.     Cervical back: Normal range of motion.  Skin:    General: Skin is warm and dry.  Neurological:     General: No focal deficit present.     Mental Status: He is alert and oriented to person, place, and time.  Psychiatric:        Mood and Affect: Mood normal.        Behavior: Behavior normal.    Labs & Radiologic Studies    CBC Recent Labs    02/10/20 0803 02/11/20 0351  WBC 9.2 13.4*  HGB 13.0 13.3  HCT 41.1 40.5  MCV 89.3 88.6  PLT 231 960    Basic Metabolic Panel Recent Labs    02/08/20 1522 02/11/20 0351  NA 139 138  K 3.9 3.7  CL 105 107  CO2 25 21*  GLUCOSE 92 100*  BUN 19 12  CREATININE 1.23 1.09  CALCIUM 8.9 8.7*   _____________  CARDIAC CATHETERIZATION  Result Date: 02/10/2020  A drug-eluting stent was successfully placed using a STENT RESOLUTE ONYX 3.5X12.  Post intervention, there is a 0% residual stenosis.  Post intervention, there is a 0% residual stenosis.  A drug-eluting stent was successfully placed using a STENT RESOLUTE ONYX 2.75X30.  Post intervention, there is a 0% residual stenosis.  Balloon angioplasty was performed using a BALLOON SAPPHIRE 2.5X15.  Post intervention, there is a 50% residual stenosis.  Origin to Prox Graft lesion is 100% stenosed.  1.  Severe native  multivessel coronary artery disease with total occlusion of the left circumflex ostium, severe stenosis of the proximal LAD, and severe complex calcific stenosis of the distal RCA involving the PDA and PLA branches 2.  Status post aortocoronary bypass surgery with continued patency of the LIMA to LAD and occlusion of all other grafts 3.  Successful PCI of the native RCA distal bifurcation using orbital atherectomy, main branch stenting, and sidebranch balloon angioplasty. Recommend: Aspirin 81 mg daily x1 month, clopidogrel 75 mg daily at least 6 months without interruption, resume apixaban tomorrow morning.   Disposition   Pt is being discharged home today in good condition.  Follow-up Plans & Appointments     Follow-up Information    Satira Sark, MD. Go on 04/04/2020.   Specialty: Cardiology Why: @2pm  for cath follow up  Contact information: [address] [place] [address] [phone]              Discharge Instructions    Diet - low sodium heart healthy   Complete by: As directed    Discharge instructions   Complete by: As directed    No driving for 48 hours. No lifting over 5 lbs for 1 week. No  sexual activity for 1 week. Keep procedure site clean & dry. If you notice increased pain, swelling, bleeding or pus, call/return!  You may shower, but no soaking baths/hot tubs/pools for 1 week.   Increase activity slowly   Complete by: As directed       Discharge Medications   Allergies as of 02/11/2020   No Known Allergies     Medication List    TAKE these medications   amiodarone 200 MG tablet Commonly known as: PACERONE TAKE 1/2 TABLET BY MOUTH DAILY (cut in half) What changed:   how much to take  how to take this  when to take this  additional instructions   aspirin EC 81 MG tablet Take 81 mg by mouth daily.   atorvastatin 80 MG tablet Commonly known as: LIPITOR Take 1 tablet (80 mg total) by mouth at bedtime. What changed:   medication strength  how much to take   clopidogrel 75 MG tablet Commonly known as: PLAVIX Take 1 tablet (75 mg total) by mouth daily with breakfast.   diltiazem 240 MG 24 hr capsule Commonly known as: CARDIZEM CD TAKE ONE CAPSULE BY MOUTH DAILY   Eliquis 5 MG Tabs tablet Generic drug: apixaban TAKE 1 TABLET BY MOUTH TWICE DAILY What changed: how much to take   finasteride 5 MG tablet Commonly known as: PROSCAR Take 5 mg by mouth at bedtime.   Fish Oil 1000 MG Caps Take 1,000 mg by mouth at bedtime.   Flomax 0.4 MG Caps capsule Generic drug: tamsulosin Take 0.4 mg by mouth at bedtime.   furosemide 20 MG tablet Commonly known as: LASIX TAKE TWO TABLETS BY MOUTH DAILY What changed:   how much to take  when to take this   isosorbide mononitrate 30 MG 24 hr tablet Commonly known as: IMDUR Take 2 tablets (60 mg total) by mouth in the morning. & 30 mg (1 tablet) in the evening What changed:   how much to take  when to take this  additional instructions   metoprolol tartrate 25 MG tablet Commonly known as: LOPRESSOR TAKE 1 TABLET BY MOUTH TWICE DAILY   multivitamin tablet Take 1 tablet by mouth at bedtime.    nitroGLYCERIN 0.4 MG SL tablet Commonly known as: NITROSTAT  DISSOLVE 1 TABLET UNDER THE TONGUE EVERY 5 MINUTES AS NEEDED FOR CHEST PAIN. DO NOT EXCEED A TOTAL OF 3 DOSES IN 15 MINUTES. What changed: See the new instructions.   Refresh Tears 0.5 % Soln Generic drug: carboxymethylcellulose Place 1 drop into both eyes daily as needed (Dry eyes).   valACYclovir 500 MG tablet Commonly known as: VALTREX Take 500 mg by mouth daily as needed (fever blisters).   vitamin C with rose hips 500 MG tablet Take 500 mg by mouth at bedtime.   zinc gluconate 50 MG tablet Take 50 mg by mouth at bedtime.          Outstanding Labs/Studies   Lipid panel and LFTS as outpatient   Duration of Discharge Encounter   Greater than 30 minutes including physician time.  Jarrett Soho, PA 02/11/2020, 8:19 AM

## 2020-02-11 NOTE — Progress Notes (Signed)
CARDIAC REHAB PHASE I   PRE:  Rate/Rhythm: 59 SB    BP: sitting 176/77    SaO2:   MODE:  Ambulation: 940 ft   POST:  Rate/Rhythm: 79 SR    BP: sitting 191/85     SaO2:   Pt ambulating well, no angina with two quick laps. BP elevated, just received imdur. Reviewed ed with pt, very knowledgeable with his CAD. Understands importance of Plavix. Will refer to [place]. Pt will think about doing program again. [place], ACSM 02/11/2020 9:18 AM

## 2020-02-11 NOTE — Progress Notes (Signed)
Report given to [place], Therapist, sports. Patient is alert and oriented x4 resting in bed with call light in reach. 1st degree heart block noted on tele. VSS. Right groin is at a level 0 s/p cath. Plan is for patient to be [place] home today. Patient ambulated entire length of hall x2 overnight with no pain or SOB noted. WBC 13.4, Hgb 13.3, platelets 207, K+ 3.7, Creat 1.09 this AM.

## 2020-02-11 NOTE — Discharge Instructions (Signed)
Information about your medication: Plavix (anti-platelet agent)  Generic Name (Brand): clopidogrel (Plavix), once daily medication  PURPOSE: You are taking this medication along with aspirin to lower your chance of having a heart attack, stroke, or blood clots in your heart stent. These can be fatal. Plavix and aspirin help prevent platelets from sticking together and forming a clot that can block an artery or your stent.   Common SIDE EFFECTS you may experience include: bruising or bleeding more easily, shortness of breath  Do not stop taking PLAVIX without talking to the doctor who prescribes it for you. People who are treated with a stent and stop taking Plavix too soon, have a higher risk of getting a blood clot in the stent, having a heart attack, or dying. If you stop Plavix because of bleeding, or for other reasons, your risk of a heart attack or stroke may increase.   Avoid taking NSAID agents or anti-inflammatory medications such as ibuprofen, naproxen given increased bleed risk with plavix - can use acetaminophen (Tylenol) if needed for pain.  Avoid taking over the counter stomach medications omeprazole (Prilosec) or esomeprazole (Nexium) since these do interact and make plavix less effective - ask your pharmacist or doctor for alterative agents if needed for heartburn or GERD.   Tell all of your doctors and dentists that you are taking Plavix. They should talk to the doctor who prescribed Plavix for you before you have any surgery or invasive procedure.   Contact your health care provider if you experience: severe or uncontrollable bleeding, pink/red/brown urine, vomiting blood or vomit that looks like "coffee grounds", red or black stools (looks like tar), coughing up blood or blood clots ----------------------------------------------------------------------------------------------------------------------  Information on my medicine - ELIQUIS (apixaban)  Why was Eliquis prescribed  for you? Eliquis was prescribed for you to reduce the risk of a blood clot forming that can cause a stroke if you have a medical condition called atrial fibrillation (a type of irregular heartbeat).  What do You need to know about Eliquis ? Take your Eliquis TWICE DAILY - one tablet in the morning and one tablet in the evening with or without food. If you have difficulty swallowing the tablet whole please discuss with your pharmacist how to take the medication safely.  Take Eliquis exactly as prescribed by your doctor and DO NOT stop taking Eliquis without talking to the doctor who prescribed the medication.  Stopping may increase your risk of developing a stroke.  Refill your prescription before you run out.  After discharge, you should have regular check-up appointments with your healthcare provider that is prescribing your Eliquis.  In the future your dose may need to be changed if your kidney function or weight changes by a significant amount or as you get older.  What do you do if you miss a dose? If you miss a dose, take it as soon as you remember on the same day and resume taking twice daily.  Do not take more than one dose of ELIQUIS at the same time to make up a missed dose.  Important Safety Information A possible side effect of Eliquis is bleeding. You should call your healthcare provider right away if you experience any of the following: ? Bleeding from an injury or your nose that does not stop. ? Unusual colored urine (red or dark brown) or unusual colored stools (red or black). ? Unusual bruising for unknown reasons. ? A serious fall or if you hit your head (even if there   is no bleeding).  Some medicines may interact with Eliquis and might increase your risk of bleeding or clotting while on Eliquis. To help avoid this, consult your healthcare provider or pharmacist prior to using any new prescription or non-prescription medications, including herbals, vitamins,  non-steroidal anti-inflammatory drugs (NSAIDs) and supplements.  This website has more information on Eliquis (apixaban): http://www.eliquis.com/eliquis/home    

## 2020-02-25 ENCOUNTER — Ambulatory Visit: Admit: 2020-02-25 | Discharge: 2020-02-26 | Payer: MEDICARE

## 2020-02-25 DIAGNOSIS — L821 Other seborrheic keratosis: Principal | ICD-10-CM

## 2020-02-25 DIAGNOSIS — Z85828 Personal history of other malignant neoplasm of skin: Principal | ICD-10-CM

## 2020-02-25 DIAGNOSIS — L820 Inflamed seborrheic keratosis: Principal | ICD-10-CM

## 2020-02-25 DIAGNOSIS — L578 Other skin changes due to chronic exposure to nonionizing radiation: Principal | ICD-10-CM

## 2020-03-31 ENCOUNTER — Telehealth: Payer: Self-pay | Admitting: *Deleted

## 2020-03-31 ENCOUNTER — Ambulatory Visit: Admit: 2020-03-31 | Discharge: 2020-04-01 | Payer: MEDICARE

## 2020-03-31 ENCOUNTER — Ambulatory Visit: Admit: 2020-03-31 | Discharge: 2020-04-01 | Payer: MEDICARE | Attending: Vascular Surgery | Primary: Vascular Surgery

## 2020-03-31 DIAGNOSIS — L299 Pruritus, unspecified: Principal | ICD-10-CM

## 2020-03-31 DIAGNOSIS — M25561 Pain in right knee: Principal | ICD-10-CM

## 2020-03-31 DIAGNOSIS — E7800 Pure hypercholesterolemia, unspecified: Principal | ICD-10-CM

## 2020-03-31 DIAGNOSIS — M1990 Unspecified osteoarthritis, unspecified site: Principal | ICD-10-CM

## 2020-03-31 DIAGNOSIS — D492 Neoplasm of unspecified behavior of bone, soft tissue, and skin: Principal | ICD-10-CM

## 2020-03-31 DIAGNOSIS — L821 Other seborrheic keratosis: Principal | ICD-10-CM

## 2020-03-31 DIAGNOSIS — Z7982 Long term (current) use of aspirin: Principal | ICD-10-CM

## 2020-03-31 DIAGNOSIS — L309 Dermatitis, unspecified: Principal | ICD-10-CM

## 2020-03-31 DIAGNOSIS — Z86718 Personal history of other venous thrombosis and embolism: Principal | ICD-10-CM

## 2020-03-31 DIAGNOSIS — Z7902 Long term (current) use of antithrombotics/antiplatelets: Principal | ICD-10-CM

## 2020-03-31 DIAGNOSIS — I82409 Acute embolism and thrombosis of unspecified deep veins of unspecified lower extremity: Principal | ICD-10-CM

## 2020-03-31 DIAGNOSIS — M7989 Other specified soft tissue disorders: Principal | ICD-10-CM

## 2020-03-31 DIAGNOSIS — I10 Essential (primary) hypertension: Principal | ICD-10-CM

## 2020-03-31 DIAGNOSIS — R221 Localized swelling, mass and lump, neck: Principal | ICD-10-CM

## 2020-03-31 DIAGNOSIS — L906 Striae atrophicae: Principal | ICD-10-CM

## 2020-03-31 DIAGNOSIS — Z951 Presence of aortocoronary bypass graft: Principal | ICD-10-CM

## 2020-03-31 DIAGNOSIS — Z85828 Personal history of other malignant neoplasm of skin: Principal | ICD-10-CM

## 2020-03-31 DIAGNOSIS — Z7901 Long term (current) use of anticoagulants: Principal | ICD-10-CM

## 2020-03-31 DIAGNOSIS — N400 Enlarged prostate without lower urinary tract symptoms: Principal | ICD-10-CM

## 2020-03-31 DIAGNOSIS — C44712 Basal cell carcinoma of skin of right lower limb, including hip: Principal | ICD-10-CM

## 2020-03-31 DIAGNOSIS — C44612 Basal cell carcinoma of skin of right upper limb, including shoulder: Principal | ICD-10-CM

## 2020-03-31 DIAGNOSIS — I82431 Acute embolism and thrombosis of right popliteal vein: Principal | ICD-10-CM

## 2020-03-31 MED ORDER — TRIAMCINOLONE ACETONIDE 0.1 % TOPICAL OINTMENT
Freq: Two times a day (BID) | TOPICAL | 2 refills | 0.00000 days | Status: CP
Start: 2020-03-31 — End: 2021-03-31

## 2020-03-31 NOTE — Telephone Encounter (Signed)
Jaynie Crumble NP informed and verbalized understanding of plan. Per Sonia Baller NP, she will contact patient for treatment of DVT and will fax copy of preliminary to our office that shows DVT-she wanted to make sure we would follow up with patient.

## 2020-03-31 NOTE — Telephone Encounter (Signed)
Patient seen at [facility] today with c/o right leg pain. A venous duplex was ordered and a right popliteal DVT/acute was found. Dermatology suggested patient follow with PCP for treatment but patient request treatment by cardiology. Advised message would be sent to provider.

## 2020-03-31 NOTE — Telephone Encounter (Signed)
Please get report of the venous Doppler study for clarification.  If he does have an acute right popliteal DVT, this needs to be treated with anticoagulation.  He already has a history of paroxysmal atrial fibrillation and should be on Eliquis, but per discussion with nursing it seems that he has not been compliant with therapy on a consistent basis.  He also has CAD status post recent PCI in September and is on aspirin and Plavix.  He needs to start Eliquis 10 mg twice daily for the next 7 days and then reduce the dose to 5 mg twice daily.  Since he over a month out from PCI, would suggest stopping aspirin during this time, but otherwise continue Plavix.  Schedule an office follow-up in 1 week for review.  If his leg continues to have increasing pain and swelling or he becomes short of breath, he should be seen in the ER sooner.

## 2020-04-04 ENCOUNTER — Encounter: Payer: Self-pay | Admitting: Cardiology

## 2020-04-04 ENCOUNTER — Ambulatory Visit: Payer: Medicare Other | Admitting: Cardiology

## 2020-04-04 VITALS — BP 144/72 | HR 56 | Ht 67.0 in | Wt 196.0 lb

## 2020-04-04 DIAGNOSIS — I82431 Acute embolism and thrombosis of right popliteal vein: Secondary | ICD-10-CM | POA: Diagnosis not present

## 2020-04-04 DIAGNOSIS — I25119 Atherosclerotic heart disease of native coronary artery with unspecified angina pectoris: Secondary | ICD-10-CM | POA: Diagnosis not present

## 2020-04-04 DIAGNOSIS — I480 Paroxysmal atrial fibrillation: Secondary | ICD-10-CM | POA: Diagnosis not present

## 2020-04-04 NOTE — Patient Instructions (Signed)
Medication Instructions:   Your physician recommends that you continue on your current medications as directed. Please refer to the Current Medication list given to you today.  After your 7 day course of 10 mg elquis-reduce to 5 mg twice daily  Labwork:  None  Testing/Procedures: Your physician has requested that you have a lower extremity venous duplex. This test is an ultrasound of the veins in the legs or arms. It looks at venous blood flow that carries blood from the heart to the legs or arms. Allow one hour for a Lower Venous exam. Allow thirty minutes for an Upper Venous exam. There are no restrictions or special instructions.  Follow-Up:  Your physician recommends that you schedule a follow-up appointment in: 1 month  Any Other Special Instructions Will Be Listed Below (If Applicable).  If you need a refill on your cardiac medications before your next appointment, please call your pharmacy.

## 2020-04-04 NOTE — Progress Notes (Signed)
Cardiology Office Note  Date: 04/04/2020   ID: Eric Borer., DOB 1940/12/30, MRN [id number]  PCP:  Celene Squibb, MD  Cardiologist:  Rozann Lesches, MD Electrophysiologist:  None   Chief Complaint  Patient presents with  . Cardiac follow-up    History of Present Illness: Eric Gonzaga. is a 79 y.o. male last seen in September.  He presents for a follow-up visit.  Patient was seen recently at [facility], experiencing right leg pain at that time and underwent a venous duplex scan that demonstrated an acute right popliteal DVT.  He had not been completely consistent with Eliquis dosing as an outpatient, this was increased to 10 mg twice daily for a 7-day course for treatment of acute DVT with plan to resume 5 mg twice daily thereafter.  He reports some decrease in swelling in his right leg, still discomfort.  This does get better when he keeps his leg up.  He is tolerating his current medications, no spontaneous bleeding problems on Eliquis and Plavix.  He underwent cardiac catheterization with Dr. Burt Knack in September.  He was found to have patent LIMA to LAD with occlusion of all other vein grafts and underwent DES intervention to the native RCA as noted below.  Angina symptoms are somewhat better.  Past Medical History:  Diagnosis Date  . Arthritis   . Ascending aortic aneurysm [facility])    Status post repair 12/2011 at [facility]  . BPH (benign prostatic hyperplasia)   . Chronic diastolic CHF (congestive heart failure) ([place])   . Coronary atherosclerosis of native coronary artery    a. Multivessel status post prior stenting and ultimately CABG 12/2011 at [facility] with with LIMA to LAD, SVG to PLB, SVG to ramus, SVG to OM . b. Last cath 06/2014 (following ischemic nuc) -> medical therapy, no feasible way to revascularize LCx territory.  . Essential hypertension   . Mixed hyperlipidemia   . Myocardial infarction ([place])   . PAD (peripheral artery disease) ([place])   . PAF  (paroxysmal atrial fibrillation) ([place])   . Supraventricular tachycardia [facility])     Past Surgical History:  Procedure Laterality Date  . ASCENDING AORTIC ANEURYSM REPAIR  12/2011 [facility]   26 mm.Dacron graft  . CORONARY ARTERY BYPASS GRAFT  12/2011 [facility]   LIMA to LAD, SVG to PLB, SVG to ramus, SVG to OM  . CORONARY ARTERY BYPASS GRAFT N/A 08/06/2017   Procedure: REDO CORONARY ARTERY BYPASS GRAFTING (CABG) TIMES FOUR UTILIZING LEFT GREATER SAPHENOUS VEIN HAVESTED ENDOVASCULARLY, LEFT RADIAL ARTERY.  RIGHT AXILLARY CANNULATION;  Surgeon: Gaye Pollack, MD;  Location: [place];  Service: Open Heart Surgery;  Laterality: N/A;  . CORONARY ATHERECTOMY N/A 02/10/2020   Procedure: CORONARY ATHERECTOMY;  Surgeon: [name], MD;  Location: [place] CV LAB;  Service: Cardiovascular;  Laterality: N/A;  . CORONARY STENT INTERVENTION N/A 02/10/2020   Procedure: CORONARY STENT INTERVENTION;  Surgeon: [name], MD;  Location: [place] CV LAB;  Service: Cardiovascular;  Laterality: N/A;  . HERNIA REPAIR    . LEFT HEART CATH AND CORS/GRAFTS ANGIOGRAPHY N/A 07/11/2017   Procedure: LEFT HEART CATH AND CORS/GRAFTS ANGIOGRAPHY;  Surgeon: Troy Sine, MD;  Location: [place] CV LAB;  Service: Cardiovascular;  Laterality: N/A;  . LEFT HEART CATH AND CORS/GRAFTS ANGIOGRAPHY N/A 09/12/2018   Procedure: LEFT HEART CATH AND CORS/GRAFTS ANGIOGRAPHY;  Surgeon: Troy Sine, MD;  Location: [place] CV LAB;  Service: Cardiovascular;  Laterality: N/A;  . LEFT  HEART CATH AND CORS/GRAFTS ANGIOGRAPHY N/A 02/10/2020   Procedure: LEFT HEART CATH AND CORS/GRAFTS ANGIOGRAPHY;  Surgeon: [name], MD;  Location: [place] CV LAB;  Service: Cardiovascular;  Laterality: N/A;  . LEFT HEART CATHETERIZATION WITH CORONARY ANGIOGRAM N/A 06/18/2014   Procedure: LEFT HEART CATHETERIZATION WITH CORONARY ANGIOGRAM;  Surgeon: Blane Ohara, MD;  Location: [facility] CATH LAB;  Service: Cardiovascular;  Laterality: N/A;  .  RADIAL ARTERY HARVEST Left 08/06/2017   Procedure: RADIAL ARTERY HARVEST;  Surgeon: Gaye Pollack, MD;  Location: [place];  Service: Open Heart Surgery;  Laterality: Left;  . TEE WITHOUT CARDIOVERSION N/A 08/06/2017   Procedure: TRANSESOPHAGEAL ECHOCARDIOGRAM (TEE);  Surgeon: Gaye Pollack, MD;  Location: [place];  Service: Open Heart Surgery;  Laterality: N/A;  . TOTAL HIP ARTHROPLASTY Right 02/07/2016   Procedure: RIGHT TOTAL HIP ARTHROPLASTY ANTERIOR APPROACH;  Surgeon: Mcarthur Rossetti, MD;  Location: [place];  Service: Orthopedics;  Laterality: Right;  . TRANSURETHRAL RESECTION OF PROSTATE      Current Outpatient Medications  Medication Sig Dispense Refill  . amiodarone (PACERONE) 200 MG tablet TAKE 1/2 TABLET BY MOUTH DAILY (cut in half) (Patient taking differently: Take 100 mg by mouth daily. ) 45 tablet 3  . apixaban (ELIQUIS) 5 MG TABS tablet Take 10 mg by mouth 2 (two) times daily.    . Ascorbic Acid (VITAMIN C WITH ROSE HIPS) 500 MG tablet Take 500 mg by mouth at bedtime.     [name] atorvastatin (LIPITOR) 80 MG tablet Take 1 tablet (80 mg total) by mouth at bedtime. 30 tablet 6  . carboxymethylcellulose (REFRESH TEARS) 0.5 % SOLN Place 1 drop into both eyes daily as needed (Dry eyes).    . clopidogrel (PLAVIX) 75 MG tablet Take 1 tablet (75 mg total) by mouth daily with breakfast. 30 tablet 6  . diltiazem (CARDIZEM CD) 240 MG 24 hr capsule TAKE ONE CAPSULE BY MOUTH DAILY (Patient taking differently: Take 240 mg by mouth daily. ) 90 capsule 1  . finasteride (PROSCAR) 5 MG tablet Take 5 mg by mouth at bedtime.     . furosemide (LASIX) 20 MG tablet TAKE TWO TABLETS BY MOUTH DAILY (Patient taking differently: Take 20 mg by mouth 2 (two) times daily. ) 180 tablet 3  . metoprolol tartrate (LOPRESSOR) 25 MG tablet TAKE 1 TABLET BY MOUTH TWICE DAILY (Patient taking differently: Take 25 mg by mouth 2 (two) times daily. ) 180 tablet 1  . Multiple Vitamin (MULTIVITAMIN) tablet Take 1 tablet by mouth  at bedtime.     . nitroGLYCERIN (NITROSTAT) 0.4 MG SL tablet DISSOLVE 1 TABLET UNDER THE TONGUE EVERY 5 MINUTES AS NEEDED FOR CHEST PAIN. DO NOT EXCEED A TOTAL OF 3 DOSES IN 15 MINUTES. (Patient taking differently: Place 0.4 mg under the tongue every 5 (five) minutes as needed for chest pain. ) 25 tablet 5  . Omega-3 Fatty Acids (FISH OIL) 1000 MG CAPS Take 1,000 mg by mouth at bedtime.     . Tamsulosin HCl (FLOMAX) 0.4 MG CAPS Take 0.4 mg by mouth at bedtime.     . valACYclovir (VALTREX) 500 MG tablet Take 500 mg by mouth daily as needed (fever blisters).     . zinc gluconate 50 MG tablet Take 50 mg by mouth at bedtime.     . isosorbide mononitrate (IMDUR) 30 MG 24 hr tablet Take 2 tablets (60 mg total) by mouth in the morning. & 30 mg (1 tablet) in the evening (Patient taking differently:  Take 30-60 mg by mouth See admin instructions. Take 60 mg in the morning and 30 mg  in the evening) 270 tablet 3   Current Facility-Administered Medications  Medication Dose Route Frequency Provider Last Rate Last Admin  . sodium chloride flush (NS) 0.9 % injection 3 mL  3 mL Intravenous Q12H Satira Sark, MD       Allergies:  Patient has no known allergies.   ROS: No palpitations or syncope.  Physical Exam: VS:  BP (!) 144/72   Pulse (!) 56   Ht 5\' 7"  (1.702 m)   Wt 196 lb (88.9 kg)   SpO2 97%   BMI 30.70 kg/m , BMI Body mass index is 30.7 kg/m.  Wt Readings from Last 3 Encounters:  04/04/20 196 lb (88.9 kg)  02/11/20 192 lb 4.8 oz (87.2 kg)  01/28/20 193 lb (87.5 kg)    General: Patient appears comfortable at rest. HEENT: Conjunctiva and lids normal, wearing a mask. Neck: Supple, no elevated JVP or carotid bruits, no thyromegaly. Lungs: Clear to auscultation, nonlabored breathing at rest. Cardiac: Regular rate and rhythm, no S3, 2/6 systolic murmur. Extremities: 2+ right lower extremity edema, increased calf size relative to the left.  No erythema.  ECG:  An ECG dated 02/11/2020 was  personally reviewed today and demonstrated:  Sinus bradycardia with prolonged PR interval, old inferior infarct pattern, diffuse nonspecific ST-T wave abnormalities.  Recent Labwork: 02/11/2020: BUN 12; Creatinine, Ser 1.09; Hemoglobin 13.3; Platelets 207; Potassium 3.7; Sodium 138   Other Studies Reviewed Today:  Cardiac catheterization 02/10/2020:  A drug-eluting stent was successfully placed using a STENT RESOLUTE ONYX 3.5X12.  Post intervention, there is a 0% residual stenosis.  Post intervention, there is a 0% residual stenosis.  A drug-eluting stent was successfully placed using a STENT RESOLUTE ONYX 2.75X30.  Post intervention, there is a 0% residual stenosis.  Balloon angioplasty was performed using a BALLOON SAPPHIRE 2.5X15.  Post intervention, there is a 50% residual stenosis.  Origin to Prox Graft lesion is 100% stenosed.   1.  Severe native multivessel coronary artery disease with total occlusion of the left circumflex ostium, severe stenosis of the proximal LAD, and severe complex calcific stenosis of the distal RCA involving the PDA and PLA branches 2.  Status post aortocoronary bypass surgery with continued patency of the LIMA to LAD and occlusion of all other grafts 3.  Successful PCI of the native RCA distal bifurcation using orbital atherectomy, main branch stenting, and sidebranch balloon angioplasty.  Recommend: Aspirin 81 mg daily x1 month, clopidogrel 75 mg daily at least 6 months without interruption, resume apixaban tomorrow morning.  Assessment and Plan:  1. Multivessel CAD status post CABG in 2013 with concurrent repair of ascending aortic aneurysm at [facility]. He underwent redo operation in May 2019 in the setting of graft failure with placement of radial to the diagonal, SVG to OM, and SVG to PDA and PL 2. Since that time he has had further graft failure and revascularization of the OM and RCA distributions.  Most recent cardiac catheterization as  noted above at which point LIMA to LAD was patent and all other vein grafts occluded.  He underwent DES intervention to the RCA.  Plan to continue Plavix, Lipitor, Imdur, and Lopressor.  2.  Recently diagnosed acute right popliteal DVT.  He is on treatment dose Eliquis for the next few days to complete 7-day course and then down to 5 mg twice daily.  Recommended that he elevate  his leg, could use hot compresses in the short-term.  We will bring him back in the next few weeks for repeat imaging and then a clinical assessment.  3.  Paroxysmal atrial fibrillation and history of PSVT.  He remains on amiodarone, Cardizem CD and Eliquis.  No active palpitations.  Medication Adjustments/Labs and Tests Ordered: Current medicines are reviewed at length with the patient today.  Concerns regarding medicines are outlined above.   Tests Ordered: Orders Placed This Encounter  Procedures  . US Venous Img Lower Unilateral Right (DVT)    Medication Changes: No orders of the defined types were placed in this encounter.   Disposition:  Follow up 1 month in the [place] office.  Signed, Satira Sark, MD, [facility] 04/04/2020 2:32 PM    [place] [facility] [place], [place], [place] [address] Phone: [phone]; Fax: [phone]

## 2020-04-08 ENCOUNTER — Ambulatory Visit (HOSPITAL_COMMUNITY): Payer: Medicare Other

## 2020-04-10 ENCOUNTER — Other Ambulatory Visit: Payer: Self-pay | Admitting: Cardiology

## 2020-04-13 ENCOUNTER — Ambulatory Visit: Admit: 2020-04-13 | Discharge: 2020-04-14 | Payer: MEDICARE | Attending: Vascular Surgery | Primary: Vascular Surgery

## 2020-04-13 DIAGNOSIS — I82431 Acute embolism and thrombosis of right popliteal vein: Principal | ICD-10-CM

## 2020-04-14 ENCOUNTER — Telehealth: Payer: Self-pay | Admitting: Cardiology

## 2020-04-14 NOTE — Telephone Encounter (Signed)
Yes, Eliquis should continue at 5 mg twice daily.  He is off aspirin but continues on Plavix.  Follow-up on the venous Doppler to make sure that there has been improvement in clot burden since he was still having some symptoms when I saw him.

## 2020-04-14 NOTE — Telephone Encounter (Signed)
Pt wanting to confirm with Dr Domenic Polite that he should be taking Eliquis 5 mg bid and to remain off aspirin - also wanting to know if he still needed to have venous US of his leg next week

## 2020-04-14 NOTE — Telephone Encounter (Signed)
Patient called requesting to speak with someone regarding his medications.

## 2020-04-14 NOTE — Telephone Encounter (Signed)
Pt voiced understanding

## 2020-04-18 ENCOUNTER — Other Ambulatory Visit: Payer: Self-pay

## 2020-04-18 ENCOUNTER — Ambulatory Visit (HOSPITAL_COMMUNITY)
Admission: RE | Admit: 2020-04-18 | Discharge: 2020-04-18 | Disposition: A | Discharge status: Home / Self Care | Payer: Medicare Other | Source: Ambulatory Visit | Attending: Cardiology | Admitting: Cardiology

## 2020-04-18 DIAGNOSIS — I82431 Acute embolism and thrombosis of right popliteal vein: Secondary | ICD-10-CM | POA: Diagnosis not present

## 2020-04-20 ENCOUNTER — Telehealth: Payer: Self-pay | Admitting: *Deleted

## 2020-04-20 ENCOUNTER — Encounter: Payer: Self-pay | Admitting: *Deleted

## 2020-04-20 DIAGNOSIS — I82431 Acute embolism and thrombosis of right popliteal vein: Secondary | ICD-10-CM

## 2020-04-20 NOTE — Telephone Encounter (Signed)
-----   Message from Satira Sark, MD sent at 04/19/2020 12:00 PM EST ----- Results reviewed.  Follow-up venous Dopplers show continued evidence of lower extremity DVT on the right.  Please make sure that he is continuing Eliquis and see how he is doing in terms of symptoms.  If he has persistent pain and swelling we may need to refer him for vascular evaluation.

## 2020-04-20 NOTE — Telephone Encounter (Signed)
Patient informed and confirmed he is taking eliquis as directed and says he does still have pain and swelling in right leg that is about the same (no more or less). Agrees and verbalized understanding of plan.

## 2020-04-20 NOTE — Telephone Encounter (Signed)
We can have him evaluated at [facility] in the [place] office just to make sure that there is nothing else we need to be addressing.

## 2020-04-25 ENCOUNTER — Telehealth: Payer: Self-pay

## 2020-04-25 NOTE — Telephone Encounter (Signed)
Patient called he wants to schedule a appt. With Dr.Newton. CB:[phone]

## 2020-04-25 NOTE — Telephone Encounter (Signed)
Right L5 TF on 12/08/19. Ok to repeat if helped, same problem/side, and no new injury?

## 2020-04-25 NOTE — Telephone Encounter (Signed)
ok 

## 2020-04-26 ENCOUNTER — Telehealth: Payer: Self-pay | Admitting: Physical Medicine and Rehabilitation

## 2020-04-26 ENCOUNTER — Telehealth: Payer: Self-pay | Admitting: Cardiology

## 2020-04-26 NOTE — Telephone Encounter (Signed)
See previous message

## 2020-04-26 NOTE — Telephone Encounter (Signed)
Left message #1

## 2020-04-26 NOTE — Telephone Encounter (Signed)
Patient called needing an appointment with Dr Ernestina Patches for his back. The number to contact patient is [phone]

## 2020-04-26 NOTE — Telephone Encounter (Signed)
Pt called asking for an appt   [phone]

## 2020-04-26 NOTE — Telephone Encounter (Signed)
Reports leg pain is better today than it was on yesterday. Advised that [facility] has recommended he follow up with his vascular doctors at [facility]. Advised that message resent today with request to be seen closer to home. Advised to contact [facility] today to get seen tomorrow. Verbalized understanding.

## 2020-04-26 NOTE — Telephone Encounter (Signed)
Left message #2

## 2020-04-26 NOTE — Telephone Encounter (Signed)
New message     Patient called requesting to speak with Alma Friendly, he states he has a blood clot behind his knee and he is in a  Lot of pain , he called yesterday and was expecting a call back?

## 2020-04-27 ENCOUNTER — Telehealth: Payer: Self-pay | Admitting: Physical Medicine and Rehabilitation

## 2020-04-27 NOTE — Telephone Encounter (Signed)
Pt received a phone call this morning from you and missed it, they really need to see newton to get an injection because he in a lot of pain. Please call him!

## 2020-04-27 NOTE — Telephone Encounter (Signed)
Is auth needed for right L5 TF? Scheduled for 1/17 with driver.

## 2020-04-27 NOTE — Telephone Encounter (Signed)
Pt called asking for an appt    [phone]  Called patient back at number give. Call was answered- static and then call ended.

## 2020-04-27 NOTE — Telephone Encounter (Signed)
See previous message

## 2020-04-28 NOTE — Telephone Encounter (Signed)
Pt not req Auth#. 

## 2020-04-29 ENCOUNTER — Other Ambulatory Visit: Payer: Self-pay

## 2020-04-29 DIAGNOSIS — I8390 Asymptomatic varicose veins of unspecified lower extremity: Secondary | ICD-10-CM

## 2020-05-02 ENCOUNTER — Ambulatory Visit: Payer: Medicare Other | Admitting: Physician Assistant

## 2020-05-02 ENCOUNTER — Ambulatory Visit (HOSPITAL_COMMUNITY)
Admission: RE | Admit: 2020-05-02 | Discharge: 2020-05-02 | Disposition: A | Discharge status: Home / Self Care | Payer: Medicare Other | Source: Ambulatory Visit | Attending: Physician Assistant | Admitting: Physician Assistant

## 2020-05-02 ENCOUNTER — Other Ambulatory Visit: Payer: Self-pay

## 2020-05-02 VITALS — BP 141/69 | HR 63 | Temp 99.0°F | Resp 20 | Ht 67.0 in | Wt 195.7 lb

## 2020-05-02 DIAGNOSIS — I8390 Asymptomatic varicose veins of unspecified lower extremity: Secondary | ICD-10-CM | POA: Diagnosis not present

## 2020-05-02 DIAGNOSIS — I82431 Acute embolism and thrombosis of right popliteal vein: Secondary | ICD-10-CM | POA: Diagnosis not present
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
## 2020-05-07 DIAGNOSIS — B001 Herpesviral vesicular dermatitis: Principal | ICD-10-CM

## 2020-05-09 MED ORDER — VALACYCLOVIR 500 MG TABLET
ORAL_TABLET | ORAL | 5 refills | 0.00000 days | Status: CP
Start: 2020-05-09 — End: ?

## 2020-05-19 ENCOUNTER — Ambulatory Visit: Payer: Self-pay

## 2020-05-19 ENCOUNTER — Encounter: Payer: Self-pay | Admitting: Physical Medicine and Rehabilitation

## 2020-05-19 ENCOUNTER — Other Ambulatory Visit: Payer: Self-pay

## 2020-05-19 ENCOUNTER — Ambulatory Visit: Payer: Medicare Other | Admitting: Physical Medicine and Rehabilitation

## 2020-05-19 VITALS — BP 140/71 | HR 58

## 2020-05-19 DIAGNOSIS — M5416 Radiculopathy, lumbar region: Secondary | ICD-10-CM

## 2020-05-19 MED ORDER — METHYLPREDNISOLONE ACETATE 80 MG/ML IJ SUSP
80.0000 mg | Freq: Once | INTRAMUSCULAR | Status: AC
  Administered 2020-05-19: 80 mg

## 2020-05-19 NOTE — Progress Notes (Signed)
Pt state lower back pain that travels to his buttock down to his right leg and foot. Pt state walking, standing and sitting makes the pain worse. Pt state blood clot behind his right knee. Pt state hx of hip replacement. Pt states he takes over the counter pain meds to help ease the pain. Pt has hx of inj on 02/10/20 pt state it helped sum and lasted for two months.  Numeric Pain Rating Scale and Functional Assessment Average Pain 5   In the last MONTH (on 0-10 scale) has pain interfered with the following?  1. General activity like being  able to carry out your everyday physical activities such as walking, climbing stairs, carrying groceries, or moving a chair?  Rating(10)   +Driver, +BT, -Dye Allergies.

## 2020-05-25 ENCOUNTER — Telehealth
Admit: 2020-05-25 | Discharge: 2020-05-26 | Payer: MEDICARE | Attending: Nurse Practitioner | Primary: Nurse Practitioner

## 2020-05-25 DIAGNOSIS — N2889 Other specified disorders of kidney and ureter: Principal | ICD-10-CM

## 2020-05-25 DIAGNOSIS — R972 Elevated prostate specific antigen [PSA]: Principal | ICD-10-CM

## 2020-05-27 ENCOUNTER — Encounter: Payer: Self-pay | Admitting: Cardiology

## 2020-05-27 ENCOUNTER — Ambulatory Visit (INDEPENDENT_AMBULATORY_CARE_PROVIDER_SITE_OTHER): Payer: Medicare Other | Admitting: Cardiology

## 2020-05-27 VITALS — BP 150/72 | HR 56 | Ht 67.0 in | Wt 196.0 lb

## 2020-05-27 DIAGNOSIS — I82531 Chronic embolism and thrombosis of right popliteal vein: Secondary | ICD-10-CM | POA: Diagnosis not present

## 2020-05-27 DIAGNOSIS — I25119 Atherosclerotic heart disease of native coronary artery with unspecified angina pectoris: Secondary | ICD-10-CM | POA: Diagnosis not present

## 2020-05-27 DIAGNOSIS — I480 Paroxysmal atrial fibrillation: Secondary | ICD-10-CM | POA: Diagnosis not present

## 2020-05-27 NOTE — Progress Notes (Signed)
Cardiology Office Note  Date: 05/27/2020   ID: Eric Borer., DOB 1940/12/24, MRN [id number]  PCP:  Celene Squibb, MD  Cardiologist:  Rozann Lesches, MD Electrophysiologist:  None   Chief Complaint  Patient presents with  . Cardiac follow-up    History of Present Illness: Eric Umholtz. is a 80 y.o. male last seen in November 2021.  He presents for a routine visit.  From a cardiac perspective, he does not report any active angina or palpitations on current regimen.  Still having pain and swelling in his right leg.  He was seen by VVS in December 2021, I reviewed the note and recommendations.  He has a right popliteal DVT, also nearby Baker's cyst.  No other intervention planned with femoral to below the knee popliteal arterial bypass in the same leg.  He remains on Eliquis, has been using a high thigh-high compression stocking, also heating pad.  I reviewed his cardiac regimen which is otherwise stable and outlined below.  He does not report any spontaneous bleeding problems.  Had lab work recently with Dr. Nevada Crane.  Past Medical History:  Diagnosis Date  . Arthritis   . Ascending aortic aneurysm [facility])    Status post repair 12/2011 at [facility]  . BPH (benign prostatic hyperplasia)   . Chronic diastolic CHF (congestive heart failure) ([place])   . Coronary atherosclerosis of native coronary artery    a. Multivessel status post prior stenting and ultimately CABG 12/2011 at [facility] with with LIMA to LAD, SVG to PLB, SVG to ramus, SVG to OM . b. Last cath 06/2014 (following ischemic nuc) -> medical therapy, no feasible way to revascularize LCx territory.  . Essential hypertension   . Mixed hyperlipidemia   . Myocardial infarction ([place])   . PAD (peripheral artery disease) ([place])   . PAF (paroxysmal atrial fibrillation) ([place])   . Supraventricular tachycardia [facility])     Past Surgical History:  Procedure Laterality Date  . ASCENDING AORTIC ANEURYSM REPAIR  12/2011 [facility]   26 mm.Dacron  graft  . CORONARY ARTERY BYPASS GRAFT  12/2011 [facility]   LIMA to LAD, SVG to PLB, SVG to ramus, SVG to OM  . CORONARY ARTERY BYPASS GRAFT N/A 08/06/2017   Procedure: REDO CORONARY ARTERY BYPASS GRAFTING (CABG) TIMES FOUR UTILIZING LEFT GREATER SAPHENOUS VEIN HAVESTED ENDOVASCULARLY, LEFT RADIAL ARTERY.  RIGHT AXILLARY CANNULATION;  Surgeon: Gaye Pollack, MD;  Location: [place];  Service: Open Heart Surgery;  Laterality: N/A;  . CORONARY ATHERECTOMY N/A 02/10/2020   Procedure: CORONARY ATHERECTOMY;  Surgeon: [name], MD;  Location: [place] CV LAB;  Service: Cardiovascular;  Laterality: N/A;  . CORONARY STENT INTERVENTION N/A 02/10/2020   Procedure: CORONARY STENT INTERVENTION;  Surgeon: [name], MD;  Location: [place] CV LAB;  Service: Cardiovascular;  Laterality: N/A;  . HERNIA REPAIR    . LEFT HEART CATH AND CORS/GRAFTS ANGIOGRAPHY N/A 07/11/2017   Procedure: LEFT HEART CATH AND CORS/GRAFTS ANGIOGRAPHY;  Surgeon: Troy Sine, MD;  Location: [place] CV LAB;  Service: Cardiovascular;  Laterality: N/A;  . LEFT HEART CATH AND CORS/GRAFTS ANGIOGRAPHY N/A 09/12/2018   Procedure: LEFT HEART CATH AND CORS/GRAFTS ANGIOGRAPHY;  Surgeon: Troy Sine, MD;  Location: [place] CV LAB;  Service: Cardiovascular;  Laterality: N/A;  . LEFT HEART CATH AND CORS/GRAFTS ANGIOGRAPHY N/A 02/10/2020   Procedure: LEFT HEART CATH AND CORS/GRAFTS ANGIOGRAPHY;  Surgeon: [name], MD;  Location: [place] CV LAB;  Service: Cardiovascular;  Laterality:  N/A;  . LEFT HEART CATHETERIZATION WITH CORONARY ANGIOGRAM N/A 06/18/2014   Procedure: LEFT HEART CATHETERIZATION WITH CORONARY ANGIOGRAM;  Surgeon: Blane Ohara, MD;  Location: [facility] CATH LAB;  Service: Cardiovascular;  Laterality: N/A;  . RADIAL ARTERY HARVEST Left 08/06/2017   Procedure: RADIAL ARTERY HARVEST;  Surgeon: Gaye Pollack, MD;  Location: [place];  Service: Open Heart Surgery;  Laterality: Left;  . TEE WITHOUT CARDIOVERSION  N/A 08/06/2017   Procedure: TRANSESOPHAGEAL ECHOCARDIOGRAM (TEE);  Surgeon: Gaye Pollack, MD;  Location: [place];  Service: Open Heart Surgery;  Laterality: N/A;  . TOTAL HIP ARTHROPLASTY Right 02/07/2016   Procedure: RIGHT TOTAL HIP ARTHROPLASTY ANTERIOR APPROACH;  Surgeon: Mcarthur Rossetti, MD;  Location: [place];  Service: Orthopedics;  Laterality: Right;  . TRANSURETHRAL RESECTION OF PROSTATE      Current Outpatient Medications  Medication Sig Dispense Refill  . amiodarone (PACERONE) 200 MG tablet TAKE 1/2 TABLET BY MOUTH DAILY (cut in half) (Patient taking differently: Take 100 mg by mouth daily.) 45 tablet 3  . apixaban (ELIQUIS) 5 MG TABS tablet Take 10 mg by mouth 2 (two) times daily.    . Ascorbic Acid (VITAMIN C WITH ROSE HIPS) 500 MG tablet Take 500 mg by mouth at bedtime.     [name] atorvastatin (LIPITOR) 80 MG tablet Take 1 tablet (80 mg total) by mouth at bedtime. 30 tablet 6  . carboxymethylcellulose (REFRESH PLUS) 0.5 % SOLN Place 1 drop into both eyes daily as needed (Dry eyes).    . clopidogrel (PLAVIX) 75 MG tablet Take 1 tablet (75 mg total) by mouth daily with breakfast. 30 tablet 6  . diltiazem (TIAZAC) 240 MG 24 hr capsule Take 1 capsule by mouth daily.    . finasteride (PROSCAR) 5 MG tablet Take 5 mg by mouth at bedtime.     . furosemide (LASIX) 20 MG tablet TAKE TWO TABLETS BY MOUTH DAILY (Patient taking differently: Take 20 mg by mouth 2 (two) times daily.) 180 tablet 3  . metoprolol tartrate (LOPRESSOR) 25 MG tablet TAKE 1 TABLET BY MOUTH TWICE DAILY (Patient taking differently: Take 25 mg by mouth 2 (two) times daily.) 180 tablet 1  . Multiple Vitamin (MULTIVITAMIN) tablet Take 1 tablet by mouth at bedtime.    . nitroGLYCERIN (NITROSTAT) 0.4 MG SL tablet DISSOLVE 1 TABLET UNDER THE TONGUE EVERY 5 MINUTES AS NEEDED FOR CHEST PAIN. DO NOT EXCEED A TOTAL OF 3 DOSES IN 15 MINUTES. (Patient taking differently: Place 0.4 mg under the tongue every 5 (five) minutes as needed for  chest pain.) 25 tablet 5  . Omega-3 Fatty Acids (FISH OIL) 1000 MG CAPS Take 1,000 mg by mouth at bedtime.     . tamsulosin (FLOMAX) 0.4 MG CAPS capsule Take 0.4 mg by mouth at bedtime.    . triamcinolone ointment (KENALOG) 0.1 % Apply topically.    . valACYclovir (VALTREX) 500 MG tablet Take 500 mg by mouth daily as needed (fever blisters).     . zinc gluconate 50 MG tablet Take 50 mg by mouth at bedtime.     . isosorbide mononitrate (IMDUR) 30 MG 24 hr tablet Take 2 tablets (60 mg total) by mouth in the morning. & 30 mg (1 tablet) in the evening (Patient taking differently: Take 30-60 mg by mouth See admin instructions. Take 60 mg in the morning and 30 mg  in the evening) 270 tablet 3   Current Facility-Administered Medications  Medication Dose Route Frequency Provider Last Rate Last Admin  .  sodium chloride flush (NS) 0.9 % injection 3 mL  3 mL Intravenous Q12H Satira Sark, MD       Allergies:  Patient has no known allergies.   ROS: No syncope.  Physical Exam: VS:  BP (!) 150/72   Pulse (!) 56   Ht 5\' 7"  (1.702 m)   Wt 196 lb (88.9 kg)   SpO2 95%   BMI 30.70 kg/m , BMI Body mass index is 30.7 kg/m.  Wt Readings from Last 3 Encounters:  05/27/20 196 lb (88.9 kg)  05/02/20 195 lb 11.2 oz (88.8 kg)  04/04/20 196 lb (88.9 kg)    General: Patient appears comfortable at rest. HEENT: Conjunctiva and lids normal, wearing a mask. Neck: Supple, no elevated JVP or carotid bruits, no thyromegaly. Lungs: Clear to auscultation, nonlabored breathing at rest. Cardiac: Regular rate and rhythm, no S3, 2/6 systolic murmur, no pericardial rub. Extremities: Thigh-high compression stocking right leg.  ECG:  An ECG dated 02/11/2020 was personally reviewed today and demonstrated:  Sinus bradycardia with prolonged PR interval, old inferior infarct pattern, diffuse nonspecific ST-T changes.  Recent Labwork: 02/11/2020: BUN 12; Creatinine, Ser 1.09; Hemoglobin 13.3; Platelets 207; Potassium  3.7; Sodium 138     Component Value Date/Time   CHOL 130 03/06/2017 0610   TRIG 262 (H) 03/06/2017 0610   HDL 28 (L) 03/06/2017 0610   CHOLHDL 4.6 03/06/2017 0610   VLDL 52 (H) 03/06/2017 0610   LDLCALC 50 03/06/2017 0610    Other Studies Reviewed Today:  Cardiac catheterization 02/10/2020:  A drug-eluting stent was successfully placed using a STENT RESOLUTE ONYX 3.5X12.  Post intervention, there is a 0% residual stenosis.  Post intervention, there is a 0% residual stenosis.  A drug-eluting stent was successfully placed using a STENT RESOLUTE ONYX 2.75X30.  Post intervention, there is a 0% residual stenosis.  Balloon angioplasty was performed using a BALLOON SAPPHIRE 2.5X15.  Post intervention, there is a 50% residual stenosis.  Origin to Prox Graft lesion is 100% stenosed.  1. Severe native multivessel coronary artery disease with total occlusion of the left circumflex ostium, severe stenosis of the proximal LAD, and severe complex calcific stenosis of the distal RCA involving the PDA and PLA branches 2. Status post aortocoronary bypass surgery with continued patency of the LIMA to LAD and occlusion of all other grafts 3. Successful PCI of the native RCA distal bifurcation using orbital atherectomy, main branch stenting, and sidebranch balloon angioplasty.  Recommend: Aspirin 81 mg daily x1 month, clopidogrel 75 mg daily at least 6 months without interruption, resume apixaban tomorrow morning.  Assessment and Plan:  1. Multivessel CAD status post CABG in 2013 with concurrent repair of ascending aortic aneurysm at [facility]. He underwent redo operation in May 2019 in the setting of graft failure with placement of radial to the diagonal, SVG to OM, and SVG to PDA and PL 2. Since that time he has had further graft failure and revascularization of the OM and RCA distributions.  Most recent cardiac catheterization as noted above at which point LIMA to LAD was patent and  all other vein grafts occluded.  He underwent DES intervention to the RCA.  He does not describe any progressive angina at this time and our plan is to continue medical therapy and observation.  Only on Plavix, Lipitor, Imdur, Lopressor, and Tiazac.  2. Right popliteal DVT as discussed above.  Continue Eliquis, thigh-high compression hose.  He was evaluated by [facility].  3.  Paroxysmal atrial  fibrillation and PSVT.  No active palpitations, continues on low-dose amiodarone along with Lopressor, Tiazac, and Eliquis.  Medication Adjustments/Labs and Tests Ordered: Current medicines are reviewed at length with the patient today.  Concerns regarding medicines are outlined above.   Tests Ordered: No orders of the defined types were placed in this encounter.   Medication Changes: No orders of the defined types were placed in this encounter.   Disposition:  Follow up 4 months in the [place] office.  Signed, Satira Sark, MD, [facility] 05/27/2020 1:25 PM    [facility] [place], [place], [place] [address] Phone: [phone]; Fax: [phone]

## 2020-05-27 NOTE — Patient Instructions (Signed)
Medication Instructions:  Continue all current medications.  Labwork: none  Testing/Procedures: none  Follow-Up: 4 months   Any Other Special Instructions Will Be Listed Below (If Applicable).  If you need a refill on your cardiac medications before your next appointment, please call your pharmacy.\ 

## 2020-05-30 ENCOUNTER — Ambulatory Visit: Payer: Medicare Other | Admitting: Physical Medicine and Rehabilitation

## 2020-06-01 ENCOUNTER — Other Ambulatory Visit: Payer: Self-pay | Admitting: Cardiology

## 2020-06-02 ENCOUNTER — Telehealth: Payer: Self-pay | Admitting: Physical Medicine and Rehabilitation

## 2020-06-02 NOTE — Telephone Encounter (Signed)
Try to get auth to be off BT and we can try interlam at L5-S1, or can look at new MRI

## 2020-06-02 NOTE — Telephone Encounter (Signed)
Right L5 TF on 1/6. Please advise.

## 2020-06-02 NOTE — Telephone Encounter (Signed)
Patient called. He would like Dr. Ernestina Patches to know that the pain is still there. His call back number is [phone]

## 2020-06-03 NOTE — Telephone Encounter (Signed)
Auth# is not needed if PT is sch for L5-S1 [place].

## 2020-06-03 NOTE — Telephone Encounter (Signed)
Scheduled for S1 TF with driver per Dr. Ernestina Patches. Can continue Plavix and eliquis.

## 2020-06-08 ENCOUNTER — Telehealth: Payer: Self-pay | Admitting: *Deleted

## 2020-06-08 NOTE — Telephone Encounter (Signed)
Pt voiced understanding - appt scheduled for tomorrow (opening in [place] 1120) pt will hold Eliquis for now - aware that if symptoms worsen before appt to go to [facility] ED

## 2020-06-08 NOTE — Telephone Encounter (Signed)
I understand his hesitancy, I am just concerned that if this is unstable angina, he typically should be hospitalized anyway.  If symptoms continue to escalate, he should not avoid going to be evaluated in the ER.  Otherwise, we could try to get him added to an open slot with any of our [facility] providers tomorrow.  May want to hold Eliquis temporarily in case cardiac catheterization needs to be pursued in the next 24 to 48 hours.

## 2020-06-08 NOTE — Telephone Encounter (Signed)
Based on those symptoms, it sounds like he has unstable angina.  Should probably be seen in the ER at [facility] in case he does need a follow-up cardiac catheterization.  His Eliquis dose is 5 mg twice daily at this point.

## 2020-06-08 NOTE — Telephone Encounter (Signed)
Pt c/o chest tightness of and on for the last few days recent BP readings: 195/75 today - 205/75 last night - took 2 NTG last night and BP decreased 175/75 - couldn't walk more than 4 mins today without having to stop from chest tightness and leg pain - thinks he needs another cath - no med changes says he is still on Eliquis 5 mg bid and was looking at his chart which says take 10 mg bid and wanted to clarify this as well

## 2020-06-08 NOTE — Telephone Encounter (Signed)
Pt voiced understanding and will think about going to [facility] ED, says would like to avoid this with covid cases and having to wait so long - aware that would need an ED evaluation with symptoms and pt is requesting to be seen in office and have [place] schedule cath rather than having to go through ED

## 2020-06-09 ENCOUNTER — Ambulatory Visit (INDEPENDENT_AMBULATORY_CARE_PROVIDER_SITE_OTHER): Payer: Medicare Other | Admitting: Cardiology

## 2020-06-09 ENCOUNTER — Encounter: Payer: Self-pay | Admitting: Cardiology

## 2020-06-09 ENCOUNTER — Other Ambulatory Visit: Payer: Self-pay | Admitting: Cardiology

## 2020-06-09 ENCOUNTER — Other Ambulatory Visit: Payer: Self-pay

## 2020-06-09 ENCOUNTER — Other Ambulatory Visit (HOSPITAL_COMMUNITY)
Admission: RE | Admit: 2020-06-09 | Discharge: 2020-06-09 | Disposition: A | Discharge status: Home / Self Care | Payer: Medicare Other | Source: Ambulatory Visit | Attending: Cardiology | Admitting: Cardiology

## 2020-06-09 ENCOUNTER — Telehealth: Payer: Self-pay | Admitting: Cardiology

## 2020-06-09 ENCOUNTER — Other Ambulatory Visit (HOSPITAL_COMMUNITY)
Admission: RE | Admit: 2020-06-09 | Discharge: 2020-06-09 | Disposition: A | Discharge status: Home / Self Care | Payer: Medicare Other | Source: Ambulatory Visit | Attending: Cardiovascular Disease | Admitting: Cardiovascular Disease

## 2020-06-09 VITALS — BP 140/82 | HR 56 | Ht 67.0 in | Wt 189.0 lb

## 2020-06-09 DIAGNOSIS — Z20822 Contact with and (suspected) exposure to covid-19: Secondary | ICD-10-CM | POA: Diagnosis not present

## 2020-06-09 DIAGNOSIS — I25119 Atherosclerotic heart disease of native coronary artery with unspecified angina pectoris: Secondary | ICD-10-CM

## 2020-06-09 DIAGNOSIS — I200 Unstable angina: Secondary | ICD-10-CM | POA: Insufficient documentation

## 2020-06-09 DIAGNOSIS — Z01810 Encounter for preprocedural cardiovascular examination: Secondary | ICD-10-CM

## 2020-06-09 DIAGNOSIS — I480 Paroxysmal atrial fibrillation: Secondary | ICD-10-CM

## 2020-06-09 DIAGNOSIS — Z01812 Encounter for preprocedural laboratory examination: Secondary | ICD-10-CM | POA: Diagnosis present

## 2020-06-09 LAB — CBC
HCT: 43.2 % (ref 39.0–52.0)
Hemoglobin: 13.9 g/dL (ref 13.0–17.0)
MCH: 28.2 pg (ref 26.0–34.0)
MCHC: 32.2 g/dL (ref 30.0–36.0)
MCV: 87.6 fL (ref 80.0–100.0)
Platelets: 233 10*3/uL (ref 150–400)
RBC: 4.93 MIL/uL (ref 4.22–5.81)
RDW: 16 % — ABNORMAL HIGH (ref 11.5–15.5)
WBC: 10 10*3/uL (ref 4.0–10.5)
nRBC: 0 % (ref 0.0–0.2)

## 2020-06-09 LAB — BASIC METABOLIC PANEL
Anion gap: 8 (ref 5–15)
BUN: 17 mg/dL (ref 8–23)
CO2: 23 mmol/L (ref 22–32)
Calcium: 8.8 mg/dL — ABNORMAL LOW (ref 8.9–10.3)
Chloride: 106 mmol/L (ref 98–111)
Creatinine, Ser: 0.92 mg/dL (ref 0.61–1.24)
GFR, Estimated: >60 mL/min (ref >60)
Glucose, Bld: 102 mg/dL — ABNORMAL HIGH (ref 70–99)
Potassium: 3.4 mmol/L — ABNORMAL LOW (ref 3.5–5.1)
Sodium: 137 mmol/L (ref 135–145)

## 2020-06-09 LAB — SARS CORONAVIRUS 2 (TAT 6-24 HRS): SARS Coronavirus 2: NEGATIVE

## 2020-06-09 MED ORDER — SODIUM CHLORIDE 0.9% FLUSH: 3.0000 mL | Freq: Two times a day (BID) | INTRAVENOUS | Status: DC

## 2020-06-09 NOTE — Telephone Encounter (Signed)
Pre-cert Verification for the following procedure     Left heart cath 06/10/2020 @7 :30 am with Dr. Fletcher Anon dx: unstable angina

## 2020-06-09 NOTE — Patient Instructions (Addendum)
Medication Instructions:   Your physician recommends that you continue on your current medications as directed. Please refer to the Current Medication list given to you today.  Labwork:  Your physician recommends that you return for lab work in: TODAY for pre-cath lab work and covid test.  Testing/Procedures: Your physician has requested that you have a cardiac catheterization. Cardiac catheterization is used to diagnose and/or treat various heart conditions. Doctors may recommend this procedure for a number of different reasons. The most common reason is to evaluate chest pain. Chest pain can be a symptom of coronary artery disease (CAD), and cardiac catheterization can show whether plaque is narrowing or blocking your heart's arteries. This procedure is also used to evaluate the valves, as well as measure the blood flow and oxygen levels in different parts of your heart. For further information please visit [email or web address]. Please follow instruction sheet, as given.  Follow-Up:  Your physician recommends that you schedule a follow-up appointment in: 1 month.  Any Other Special Instructions Will Be Listed Below (If Applicable).  If you need a refill on your cardiac medications before your next appointment, please call your pharmacy.     [place] [address] Dept: [phone] Loc: [place].  06/09/2020  You are scheduled for a Cardiac Catheterization on Friday, January 28 with Dr. Kathlyn Sacramento.  1. Please arrive at the [facility] (Main Entrance A) at [facility]: [address] [place], [place] [address] at 5:30 AM (This time is two hours before your procedure to ensure your preparation). Free valet parking service is available.   Special note: Every effort is made to have your procedure done on time. Please understand  that emergencies sometimes delay scheduled procedures.  2. Diet: Do not eat solid foods after midnight.  The patient may have clear liquids until 5am upon the day of the procedure.  3. Labs: You will need to have blood drawn on Thursday, January 27 at [place] before your covid test. [place] Dry after your covid test until your cath is completed.   4. Medication instructions in preparation for your procedure: Do not take Eliquis tonight or in the morning. Hold tomorrow morning furosemide dose. You may take the rest of your medications in the morning with a sip of water  Take aspirin 81 mg by mouth in the morning when you get up or before leaving for cath.    Contrast Allergy: No  No eliquis today or in the morning  No lasix (furosemide in the morning) 06/10/2020  5. Plan for one night stay--bring personal belongings. 6. Bring a current list of your medications and current insurance cards. 7. You MUST have a responsible person to drive you home. 8. Someone MUST be with you the first 24 hours after you arrive home or your discharge will be delayed. 9. Please wear clothes that are easy to get on and off and wear slip-on shoes.  Thank you for allowing [place] to care for you!    -- [place] [facility]

## 2020-06-09 NOTE — H&P (View-Only) (Signed)
  Cardiology Office Note  Date: 06/09/2020   ID: Eric L Layton Jr., DOB 01/14/1941, MRN [id number]  PCP:  Hall, John Z, MD  Cardiologist:  Calton Harshfield, MD Electrophysiologist:  None   Chief Complaint  Patient presents with  . Progressive angina    History of Present Illness: Eric L Kosanke Jr. is a 79 y.o. male seen recently on January 14.  He presents back to the office describing a 4-day history of progressive angina.  Symptoms have been occurring with various activities during the daytime, responsive to nitroglycerin.  He had been walking 15 to 20 minutes at a time, but could only walk for 2 minutes yesterday without having angina and had to stop.  He had symptoms last night.  He did call the office yesterday and was encouraged to go to the ER for further assessment, but did not want to do this given concerns about the pandemic and crowding.  He was added to the schedule today.  I did ask him to hold Eliquis in anticipation of cardiac catheterization.  We went over his medications which have been stable since our recent visit.  He is wearing a right sided thigh-high compression stocking with known right popliteal DVT, also nearby Baker's cyst as discussed in the last office note.  Still has residual swelling on that side.  I personally reviewed his ECG today which shows sinus bradycardia with borderline prolonged PR interval and nonspecific ST changes, probable old inferior infarct pattern.  Past Medical History:  Diagnosis Date  . Arthritis   . Ascending aortic aneurysm (HCC)    Status post repair 12/2011 at [facility]  . BPH (benign prostatic hyperplasia)   . Chronic diastolic CHF (congestive heart failure) (HCC)   . Coronary atherosclerosis of native coronary artery    a. Multivessel status post prior stenting and ultimately CABG 12/2011 at [facility] with with LIMA to LAD, SVG to PLB, SVG to ramus, SVG to OM . b. Last cath 06/2014 (following ischemic nuc) -> medical therapy, no  feasible way to revascularize LCx territory.  . Essential hypertension   . Mixed hyperlipidemia   . Myocardial infarction (HCC)   . PAD (peripheral artery disease) (HCC)   . PAF (paroxysmal atrial fibrillation) (HCC)   . Supraventricular tachycardia (HCC)     Past Surgical History:  Procedure Laterality Date  . ASCENDING AORTIC ANEURYSM REPAIR  12/2011 [facility]   26 mm.Dacron graft  . CORONARY ARTERY BYPASS GRAFT  12/2011 [facility]   LIMA to LAD, SVG to PLB, SVG to ramus, SVG to OM  . CORONARY ARTERY BYPASS GRAFT N/A 08/06/2017   Procedure: REDO CORONARY ARTERY BYPASS GRAFTING (CABG) TIMES FOUR UTILIZING LEFT GREATER SAPHENOUS VEIN HAVESTED ENDOVASCULARLY, LEFT RADIAL ARTERY.  RIGHT AXILLARY CANNULATION;  Surgeon: Bartle, Bryan K, MD;  Location: [facility] OR;  Service: Open Heart Surgery;  Laterality: N/A;  . CORONARY ATHERECTOMY N/A 02/10/2020   Procedure: CORONARY ATHERECTOMY;  Surgeon: Cooper, Michael, MD;  Location: [facility] INVASIVE CV LAB;  Service: Cardiovascular;  Laterality: N/A;  . CORONARY STENT INTERVENTION N/A 02/10/2020   Procedure: CORONARY STENT INTERVENTION;  Surgeon: Cooper, Michael, MD;  Location: [facility] INVASIVE CV LAB;  Service: Cardiovascular;  Laterality: N/A;  . HERNIA REPAIR    . LEFT HEART CATH AND CORS/GRAFTS ANGIOGRAPHY N/A 07/11/2017   Procedure: LEFT HEART CATH AND CORS/GRAFTS ANGIOGRAPHY;  Surgeon: Kelly, Thomas A, MD;  Location: [facility] INVASIVE CV LAB;  Service: Cardiovascular;  Laterality: N/A;  . LEFT HEART CATH AND CORS/GRAFTS ANGIOGRAPHY N/A   09/12/2018   Procedure: LEFT HEART CATH AND CORS/GRAFTS ANGIOGRAPHY;  Surgeon: Troy Sine, MD;  Location: [place] CV LAB;  Service: Cardiovascular;  Laterality: N/A;  . LEFT HEART CATH AND CORS/GRAFTS ANGIOGRAPHY N/A 02/10/2020   Procedure: LEFT HEART CATH AND CORS/GRAFTS ANGIOGRAPHY;  Surgeon: [name], MD;  Location: [place] CV LAB;  Service: Cardiovascular;  Laterality: N/A;  . LEFT HEART CATHETERIZATION WITH CORONARY ANGIOGRAM  N/A 06/18/2014   Procedure: LEFT HEART CATHETERIZATION WITH CORONARY ANGIOGRAM;  Surgeon: Blane Ohara, MD;  Location: [facility] CATH LAB;  Service: Cardiovascular;  Laterality: N/A;  . RADIAL ARTERY HARVEST Left 08/06/2017   Procedure: RADIAL ARTERY HARVEST;  Surgeon: Gaye Pollack, MD;  Location: [place];  Service: Open Heart Surgery;  Laterality: Left;  . TEE WITHOUT CARDIOVERSION N/A 08/06/2017   Procedure: TRANSESOPHAGEAL ECHOCARDIOGRAM (TEE);  Surgeon: Gaye Pollack, MD;  Location: [place];  Service: Open Heart Surgery;  Laterality: N/A;  . TOTAL HIP ARTHROPLASTY Right 02/07/2016   Procedure: RIGHT TOTAL HIP ARTHROPLASTY ANTERIOR APPROACH;  Surgeon: Mcarthur Rossetti, MD;  Location: [place];  Service: Orthopedics;  Laterality: Right;  . TRANSURETHRAL RESECTION OF PROSTATE      Current Outpatient Medications  Medication Sig Dispense Refill  . amiodarone (PACERONE) 200 MG tablet TAKE 1/2 TABLET BY MOUTH DAILY (cut in half) (Patient taking differently: Take 100 mg by mouth daily.) 45 tablet 3  . apixaban (ELIQUIS) 5 MG TABS tablet Take 5 mg by mouth 2 (two) times daily.    . Ascorbic Acid (VITAMIN C WITH ROSE HIPS) 500 MG tablet Take 500 mg by mouth at bedtime.     [name] atorvastatin (LIPITOR) 80 MG tablet Take 1 tablet (80 mg total) by mouth at bedtime. 30 tablet 6  . carboxymethylcellulose (REFRESH PLUS) 0.5 % SOLN Place 1 drop into both eyes daily as needed (Dry eyes).    . clopidogrel (PLAVIX) 75 MG tablet Take 1 tablet (75 mg total) by mouth daily with breakfast. 30 tablet 6  . diltiazem (TIAZAC) 240 MG 24 hr capsule Take 1 capsule by mouth daily.    . finasteride (PROSCAR) 5 MG tablet Take 5 mg by mouth at bedtime.     . furosemide (LASIX) 20 MG tablet TAKE TWO TABLETS BY MOUTH DAILY (Patient taking differently: Take 20 mg by mouth 2 (two) times daily.) 180 tablet 3  . metoprolol tartrate (LOPRESSOR) 25 MG tablet TAKE 1 TABLET BY MOUTH TWICE DAILY 90 tablet 3  . Multiple Vitamin (MULTIVITAMIN)  tablet Take 1 tablet by mouth at bedtime.    . nitroGLYCERIN (NITROSTAT) 0.4 MG SL tablet DISSOLVE 1 TABLET UNDER THE TONGUE EVERY 5 MINUTES AS NEEDED FOR CHEST PAIN. DO NOT EXCEED A TOTAL OF 3 DOSES IN 15 MINUTES. (Patient taking differently: Place 0.4 mg under the tongue every 5 (five) minutes as needed for chest pain.) 25 tablet 5  . Omega-3 Fatty Acids (FISH OIL) 1000 MG CAPS Take 1,000 mg by mouth at bedtime.     . tamsulosin (FLOMAX) 0.4 MG CAPS capsule Take 0.4 mg by mouth at bedtime.    . triamcinolone ointment (KENALOG) 0.1 % Apply topically.    . valACYclovir (VALTREX) 500 MG tablet Take 500 mg by mouth daily as needed (fever blisters).     . zinc gluconate 50 MG tablet Take 50 mg by mouth at bedtime.     . isosorbide mononitrate (IMDUR) 30 MG 24 hr tablet Take 2 tablets (60 mg total) by mouth in the  morning. & 30 mg (1 tablet) in the evening (Patient taking differently: Take 30-60 mg by mouth See admin instructions. Take 60 mg in the morning and 30 mg  in the evening) 270 tablet 3   Current Facility-Administered Medications  Medication Dose Route Frequency Provider Last Rate Last Admin  . sodium chloride flush (NS) 0.9 % injection 3 mL  3 mL Intravenous Q12H Satira Sark, MD       Allergies:  Patient has no known allergies.   Social History: The patient  reports that he has never smoked. He has never used smokeless tobacco. He reports current alcohol use. He reports that he does not use drugs.   Family History: The patient's family history includes Hypertension in his father and mother; Lung cancer in his sister; Parkinson's disease in his father.   ROS: No recent palpitations, no syncope.  Physical Exam: VS:  BP 140/82   Pulse (!) 56   Ht 5\' 7"  (1.702 m)   Wt 189 lb (85.7 kg)   SpO2 96%   BMI 29.60 kg/m , BMI Body mass index is 29.6 kg/m.  Wt Readings from Last 3 Encounters:  06/09/20 189 lb (85.7 kg)  05/27/20 196 lb (88.9 kg)  05/02/20 195 lb 11.2 oz (88.8 kg)     General: Patient appears comfortable at rest. HEENT: Conjunctiva and lids normal, wearing a mask. Neck: Supple, no elevated JVP or carotid bruits, no thyromegaly. Lungs: Clear to auscultation, nonlabored breathing at rest. Cardiac: Regular rate and rhythm, no S3, 2/6  systolic murmur, no pericardial rub. Abdomen: Soft, bowel sounds present. Extremities: Compression stocking on right leg. Skin: Warm and dry. Musculoskeletal: No kyphosis. Neuropsychiatric: Alert and oriented x3, affect grossly appropriate.  ECG:  An ECG dated 02/11/2020 was personally reviewed today and demonstrated:  Sinus bradycardia with prolonged PR interval, old inferior infarct pattern, diffuse nonspecific ST-T changes.  Recent Labwork: 02/11/2020: BUN 12; Creatinine, Ser 1.09; Hemoglobin 13.3; Platelets 207; Potassium 3.7; Sodium 138     Component Value Date/Time   CHOL 130 03/06/2017 0610   TRIG 262 (H) 03/06/2017 0610   HDL 28 (L) 03/06/2017 0610   CHOLHDL 4.6 03/06/2017 0610   VLDL 52 (H) 03/06/2017 0610   LDLCALC 50 03/06/2017 0610    Other Studies Reviewed Today:  Cardiac catheterization 02/10/2020:  A drug-eluting stent was successfully placed using a STENT RESOLUTE ONYX 3.5X12.  Post intervention, there is a 0% residual stenosis.  Post intervention, there is a 0% residual stenosis.  A drug-eluting stent was successfully placed using a STENT RESOLUTE ONYX 2.75X30.  Post intervention, there is a 0% residual stenosis.  Balloon angioplasty was performed using a BALLOON SAPPHIRE 2.5X15.  Post intervention, there is a 50% residual stenosis.  Origin to Prox Graft lesion is 100% stenosed.  1. Severe native multivessel coronary artery disease with total occlusion of the left circumflex ostium, severe stenosis of the proximal LAD, and severe complex calcific stenosis of the distal RCA involving the PDA and PLA branches 2. Status post aortocoronary bypass surgery with continued patency of the LIMA to  LAD and occlusion of all other grafts 3. Successful PCI of the native RCA distal bifurcation using orbital atherectomy, main branch stenting, and sidebranch balloon angioplasty.  Recommend: Aspirin 81 mg daily x1 month, clopidogrel 75 mg daily at least 6 months without interruption, resume apixaban tomorrow morning.  Assessment and Plan:  1.  Unstable angina.  He has multivessel CAD status post CABG in 2013 with concurrent repair of  ascending aortic aneurysm at [facility]. He underwent redo operation in May 2019 in the setting of graft failure with placement of radial to the diagonal, SVG to OM, and SVG to PDA and PL 2. Since that time he has had further graft failure and revascularization of the OM and RCA distributions.Most recent cardiac catheterization as noted above at which point LIMA to LAD was patent and all other vein grafts occluded. He underwent DES intervention to the RCA.  Recent regimen includes Plavix, Lipitor, Imdur, Lopressor, and Tiazac.  Plan is for diagnostic cardiac catheterization with eye towards revascularization, procedure being scheduled for tomorrow (hold Eliquis today).  I did again encourage him to go to the ER if symptoms escalate with rest pain.  2.  Right popliteal DVT as discussed above.  He continues to wear thigh-high compression hose, already evaluated by [facility].  Holding Eliquis today for procedure tomorrow.  3.  Paroxysmal atrial fibrillation and PSVT.  He has had no recent palpitations and is in sinus rhythm today.  He remains on low-dose amiodarone in addition to the medications outlined above.  Medication Adjustments/Labs and Tests Ordered: Current medicines are reviewed at length with the patient today.  Concerns regarding medicines are outlined above.   Tests Ordered: Orders Placed This Encounter  Procedures  . Basic metabolic panel  . CBC  . EKG 12-Lead    Medication Changes: No orders of the defined types were placed in this  encounter.   Disposition:  Follow up after procedure.  Signed, Satira Sark, MD, [facility] 06/09/2020 12:06 PM    [facility] [place], [place], [place] [address] Phone: [phone]; Fax: [phone]

## 2020-06-09 NOTE — Progress Notes (Signed)
Cardiology Office Note  Date: 06/09/2020   ID: Eric DukesWilliam L Mcglasson Jr., DOB 12/08/1940, MRN [id number]  PCP:  Benita StabileHall, John Z, MD  Cardiologist:  Nona DellSamuel Delanee Xin, MD Electrophysiologist:  None   Chief Complaint  Patient presents with  . Progressive angina    History of Present Illness: Eric DukesWilliam L Pita Jr. is a 80 y.o. male seen recently on January 14.  He presents back to the office describing a 4-day history of progressive angina.  Symptoms have been occurring with various activities during the daytime, responsive to nitroglycerin.  He had been walking 15 to 20 minutes at a time, but could only walk for 2 minutes yesterday without having angina and had to stop.  He had symptoms last night.  He did call the office yesterday and was encouraged to go to the ER for further assessment, but did not want to do this given concerns about the pandemic and crowding.  He was added to the schedule today.  I did ask him to hold Eliquis in anticipation of cardiac catheterization.  We went over his medications which have been stable since our recent visit.  He is wearing a right sided thigh-high compression stocking with known right popliteal DVT, also nearby Baker's cyst as discussed in the last office note.  Still has residual swelling on that side.  I personally reviewed his ECG today which shows sinus bradycardia with borderline prolonged PR interval and nonspecific ST changes, probable old inferior infarct pattern.  Past Medical History:  Diagnosis Date  . Arthritis   . Ascending aortic aneurysm [facility](HCC)    [facility]  . BPH (benign prostatic hyperplasia)   . Chronic diastolic CHF (congestive heart failure) (HCC)   . Coronary atherosclerosis of native coronary artery    a. Multivessel status post prior stenting and ultimately CABG [facility] with with LIMA to LAD, SVG to PLB, SVG to ramus, SVG to OM . b. Last cath 06/2014 (following ischemic nuc) -> medical therapy, no  feasible way to revascularize LCx territory.  . Essential hypertension   . Mixed hyperlipidemia   . Myocardial infarction (HCC)   . PAD (peripheral artery disease) (HCC)   . PAF (paroxysmal atrial fibrillation) (HCC)   . Supraventricular tachycardia [facility](HCC)     Past Surgical History:  Procedure Laterality Date  . ASCENDING AORTIC ANEURYSM REPAIR  12/2011 [facility]   26 mm.Dacron graft  . CORONARY ARTERY BYPASS GRAFT  12/2011 [facility]   LIMA to LAD, SVG to PLB, SVG to ramus, SVG to OM  . CORONARY ARTERY BYPASS GRAFT N/A 08/06/2017   Procedure: REDO CORONARY ARTERY BYPASS GRAFTING (CABG) TIMES FOUR UTILIZING LEFT GREATER SAPHENOUS VEIN HAVESTED ENDOVASCULARLY, LEFT RADIAL ARTERY.  RIGHT AXILLARY CANNULATION;  Surgeon: Alleen BorneBartle, Bryan K, MD;  [facility];  Service: Open Heart Surgery;  Laterality: N/A;  . CORONARY ATHERECTOMY N/A 02/10/2020   Procedure: CORONARY ATHERECTOMY;  Surgeon: Tonny Bollmanooper, Michael, MD;  [facility];  Service: Cardiovascular;  Laterality: N/A;  . CORONARY STENT INTERVENTION N/A 02/10/2020   Procedure: CORONARY STENT INTERVENTION;  Surgeon: Tonny Bollmanooper, Michael, MD;  [facility];  Service: Cardiovascular;  Laterality: N/A;  . HERNIA REPAIR    . LEFT HEART CATH AND CORS/GRAFTS ANGIOGRAPHY N/A 07/11/2017   Procedure: LEFT HEART CATH AND CORS/GRAFTS ANGIOGRAPHY;  Surgeon: Lennette BihariKelly, Thomas A, MD;  Location: [facility] INVASIVE CV LAB;  Service: Cardiovascular;  Laterality: N/A;  . LEFT HEART CATH AND CORS/GRAFTS ANGIOGRAPHY N/A  09/12/2018   Procedure: LEFT HEART CATH AND CORS/GRAFTS ANGIOGRAPHY;  Surgeon: Troy Sine, MD;  Location: [place] CV LAB;  Service: Cardiovascular;  Laterality: N/A;  . LEFT HEART CATH AND CORS/GRAFTS ANGIOGRAPHY N/A 02/10/2020   Procedure: LEFT HEART CATH AND CORS/GRAFTS ANGIOGRAPHY;  Surgeon: [name], MD;  Location: [place] CV LAB;  Service: Cardiovascular;  Laterality: N/A;  . LEFT HEART CATHETERIZATION WITH CORONARY ANGIOGRAM  N/A 06/18/2014   Procedure: LEFT HEART CATHETERIZATION WITH CORONARY ANGIOGRAM;  Surgeon: Blane Ohara, MD;  Location: [facility] CATH LAB;  Service: Cardiovascular;  Laterality: N/A;  . RADIAL ARTERY HARVEST Left 08/06/2017   Procedure: RADIAL ARTERY HARVEST;  Surgeon: Gaye Pollack, MD;  Location: [place];  Service: Open Heart Surgery;  Laterality: Left;  . TEE WITHOUT CARDIOVERSION N/A 08/06/2017   Procedure: TRANSESOPHAGEAL ECHOCARDIOGRAM (TEE);  Surgeon: Gaye Pollack, MD;  Location: [place];  Service: Open Heart Surgery;  Laterality: N/A;  . TOTAL HIP ARTHROPLASTY Right 02/07/2016   Procedure: RIGHT TOTAL HIP ARTHROPLASTY ANTERIOR APPROACH;  Surgeon: Mcarthur Rossetti, MD;  Location: [place];  Service: Orthopedics;  Laterality: Right;  . TRANSURETHRAL RESECTION OF PROSTATE      Current Outpatient Medications  Medication Sig Dispense Refill  . amiodarone (PACERONE) 200 MG tablet TAKE 1/2 TABLET BY MOUTH DAILY (cut in half) (Patient taking differently: Take 100 mg by mouth daily.) 45 tablet 3  . apixaban (ELIQUIS) 5 MG TABS tablet Take 5 mg by mouth 2 (two) times daily.    . Ascorbic Acid (VITAMIN C WITH ROSE HIPS) 500 MG tablet Take 500 mg by mouth at bedtime.     [name] atorvastatin (LIPITOR) 80 MG tablet Take 1 tablet (80 mg total) by mouth at bedtime. 30 tablet 6  . carboxymethylcellulose (REFRESH PLUS) 0.5 % SOLN Place 1 drop into both eyes daily as needed (Dry eyes).    . clopidogrel (PLAVIX) 75 MG tablet Take 1 tablet (75 mg total) by mouth daily with breakfast. 30 tablet 6  . diltiazem (TIAZAC) 240 MG 24 hr capsule Take 1 capsule by mouth daily.    . finasteride (PROSCAR) 5 MG tablet Take 5 mg by mouth at bedtime.     . furosemide (LASIX) 20 MG tablet TAKE TWO TABLETS BY MOUTH DAILY (Patient taking differently: Take 20 mg by mouth 2 (two) times daily.) 180 tablet 3  . metoprolol tartrate (LOPRESSOR) 25 MG tablet TAKE 1 TABLET BY MOUTH TWICE DAILY 90 tablet 3  . Multiple Vitamin (MULTIVITAMIN)  tablet Take 1 tablet by mouth at bedtime.    . nitroGLYCERIN (NITROSTAT) 0.4 MG SL tablet DISSOLVE 1 TABLET UNDER THE TONGUE EVERY 5 MINUTES AS NEEDED FOR CHEST PAIN. DO NOT EXCEED A TOTAL OF 3 DOSES IN 15 MINUTES. (Patient taking differently: Place 0.4 mg under the tongue every 5 (five) minutes as needed for chest pain.) 25 tablet 5  . Omega-3 Fatty Acids (FISH OIL) 1000 MG CAPS Take 1,000 mg by mouth at bedtime.     . tamsulosin (FLOMAX) 0.4 MG CAPS capsule Take 0.4 mg by mouth at bedtime.    . triamcinolone ointment (KENALOG) 0.1 % Apply topically.    . valACYclovir (VALTREX) 500 MG tablet Take 500 mg by mouth daily as needed (fever blisters).     . zinc gluconate 50 MG tablet Take 50 mg by mouth at bedtime.     . isosorbide mononitrate (IMDUR) 30 MG 24 hr tablet Take 2 tablets (60 mg total) by mouth in the  morning. & 30 mg (1 tablet) in the evening (Patient taking differently: Take 30-60 mg by mouth See admin instructions. Take 60 mg in the morning and 30 mg  in the evening) 270 tablet 3   Current Facility-Administered Medications  Medication Dose Route Frequency Provider Last Rate Last Admin  . sodium chloride flush (NS) 0.9 % injection 3 mL  3 mL Intravenous Q12H Satira Sark, MD       Allergies:  Patient has no known allergies.   Social History: The patient  reports that he has never smoked. He has never used smokeless tobacco. He reports current alcohol use. He reports that he does not use drugs.   Family History: The patient's family history includes Hypertension in his father and mother; Lung cancer in his sister; Parkinson's disease in his father.   ROS: No recent palpitations, no syncope.  Physical Exam: VS:  BP 140/82   Pulse (!) 56   Ht 5\' 7"  (1.702 m)   Wt 189 lb (85.7 kg)   SpO2 96%   BMI 29.60 kg/m , BMI Body mass index is 29.6 kg/m.  Wt Readings from Last 3 Encounters:  06/09/20 189 lb (85.7 kg)  05/27/20 196 lb (88.9 kg)  05/02/20 195 lb 11.2 oz (88.8 kg)     General: Patient appears comfortable at rest. HEENT: Conjunctiva and lids normal, wearing a mask. Neck: Supple, no elevated JVP or carotid bruits, no thyromegaly. Lungs: Clear to auscultation, nonlabored breathing at rest. Cardiac: Regular rate and rhythm, no S3, 2/6  systolic murmur, no pericardial rub. Abdomen: Soft, bowel sounds present. Extremities: Compression stocking on right leg. Skin: Warm and dry. Musculoskeletal: No kyphosis. Neuropsychiatric: Alert and oriented x3, affect grossly appropriate.  ECG:  An ECG dated 02/11/2020 was personally reviewed today and demonstrated:  Sinus bradycardia with prolonged PR interval, old inferior infarct pattern, diffuse nonspecific ST-T changes.  Recent Labwork: 02/11/2020: BUN 12; Creatinine, Ser 1.09; Hemoglobin 13.3; Platelets 207; Potassium 3.7; Sodium 138     Component Value Date/Time   CHOL 130 03/06/2017 0610   TRIG 262 (H) 03/06/2017 0610   HDL 28 (L) 03/06/2017 0610   CHOLHDL 4.6 03/06/2017 0610   VLDL 52 (H) 03/06/2017 0610   LDLCALC 50 03/06/2017 0610    Other Studies Reviewed Today:  Cardiac catheterization 02/10/2020:  A drug-eluting stent was successfully placed using a STENT RESOLUTE ONYX 3.5X12.  Post intervention, there is a 0% residual stenosis.  Post intervention, there is a 0% residual stenosis.  A drug-eluting stent was successfully placed using a STENT RESOLUTE ONYX 2.75X30.  Post intervention, there is a 0% residual stenosis.  Balloon angioplasty was performed using a BALLOON SAPPHIRE 2.5X15.  Post intervention, there is a 50% residual stenosis.  Origin to Prox Graft lesion is 100% stenosed.  1. Severe native multivessel coronary artery disease with total occlusion of the left circumflex ostium, severe stenosis of the proximal LAD, and severe complex calcific stenosis of the distal RCA involving the PDA and PLA branches 2. Status post aortocoronary bypass surgery with continued patency of the LIMA to  LAD and occlusion of all other grafts 3. Successful PCI of the native RCA distal bifurcation using orbital atherectomy, main branch stenting, and sidebranch balloon angioplasty.  Recommend: Aspirin 81 mg daily x1 month, clopidogrel 75 mg daily at least 6 months without interruption, resume apixaban tomorrow morning.  Assessment and Plan:  1.  Unstable angina.  He has multivessel CAD status post CABG in 2013 with concurrent repair of  ascending aortic aneurysm at [facility]. He underwent redo operation in May 2019 in the setting of graft failure with placement of radial to the diagonal, SVG to OM, and SVG to PDA and PL 2. Since that time he has had further graft failure and revascularization of the OM and RCA distributions.Most recent cardiac catheterization as noted above at which point LIMA to LAD was patent and all other vein grafts occluded. He underwent DES intervention to the RCA.  Recent regimen includes Plavix, Lipitor, Imdur, Lopressor, and Tiazac.  Plan is for diagnostic cardiac catheterization with eye towards revascularization, procedure being scheduled for tomorrow (hold Eliquis today).  I did again encourage him to go to the ER if symptoms escalate with rest pain.  2.  Right popliteal DVT as discussed above.  He continues to wear thigh-high compression hose, already evaluated by [facility].  Holding Eliquis today for procedure tomorrow.  3.  Paroxysmal atrial fibrillation and PSVT.  He has had no recent palpitations and is in sinus rhythm today.  He remains on low-dose amiodarone in addition to the medications outlined above.  Medication Adjustments/Labs and Tests Ordered: Current medicines are reviewed at length with the patient today.  Concerns regarding medicines are outlined above.   Tests Ordered: Orders Placed This Encounter  Procedures  . Basic metabolic panel  . CBC  . EKG 12-Lead    Medication Changes: No orders of the defined types were placed in this  encounter.   Disposition:  Follow up after procedure.  Signed, Satira Sark, MD, [facility] 06/09/2020 12:06 PM    [facility] [address], [place], [place] [address] Phone: [phone]; Fax: [phone]

## 2020-06-10 ENCOUNTER — Ambulatory Visit (HOSPITAL_COMMUNITY)
Admission: RE | Admit: 2020-06-10 | Discharge: 2020-06-10 | Disposition: A | Discharge status: Home / Self Care | Payer: Medicare Other | Attending: Cardiovascular Disease | Admitting: Cardiovascular Disease

## 2020-06-10 ENCOUNTER — Encounter (HOSPITAL_COMMUNITY)
Admission: RE | Disposition: A | Discharge status: Home / Self Care | Payer: Self-pay | Source: Home / Self Care | Attending: Cardiovascular Disease

## 2020-06-10 ENCOUNTER — Other Ambulatory Visit: Payer: Self-pay

## 2020-06-10 DIAGNOSIS — I25119 Atherosclerotic heart disease of native coronary artery with unspecified angina pectoris: Secondary | ICD-10-CM

## 2020-06-10 DIAGNOSIS — I480 Paroxysmal atrial fibrillation: Secondary | ICD-10-CM | POA: Insufficient documentation

## 2020-06-10 DIAGNOSIS — Z7902 Long term (current) use of antithrombotics/antiplatelets: Secondary | ICD-10-CM | POA: Insufficient documentation

## 2020-06-10 DIAGNOSIS — Z955 Presence of coronary angioplasty implant and graft: Secondary | ICD-10-CM | POA: Insufficient documentation

## 2020-06-10 DIAGNOSIS — Z79899 Other long term (current) drug therapy: Secondary | ICD-10-CM | POA: Diagnosis not present

## 2020-06-10 DIAGNOSIS — Z7901 Long term (current) use of anticoagulants: Secondary | ICD-10-CM | POA: Insufficient documentation

## 2020-06-10 DIAGNOSIS — I25710 Atherosclerosis of autologous vein coronary artery bypass graft(s) with unstable angina pectoris: Secondary | ICD-10-CM

## 2020-06-10 DIAGNOSIS — Z951 Presence of aortocoronary bypass graft: Secondary | ICD-10-CM | POA: Insufficient documentation

## 2020-06-10 DIAGNOSIS — I25110 Atherosclerotic heart disease of native coronary artery with unstable angina pectoris: Secondary | ICD-10-CM | POA: Diagnosis not present

## 2020-06-10 DIAGNOSIS — I82431 Acute embolism and thrombosis of right popliteal vein: Secondary | ICD-10-CM | POA: Diagnosis not present

## 2020-06-10 DIAGNOSIS — I200 Unstable angina: Secondary | ICD-10-CM

## 2020-06-10 HISTORY — PX: CORONARY BALLOON ANGIOPLASTY: CATH118233

## 2020-06-10 HISTORY — PX: LEFT HEART CATH AND CORS/GRAFTS ANGIOGRAPHY: CATH118250

## 2020-06-10 LAB — POCT ACTIVATED CLOTTING TIME: Activated Clotting Time: 345 seconds

## 2020-06-10 SURGERY — LEFT HEART CATH AND CORS/GRAFTS ANGIOGRAPHY
Anesthesia: LOCAL

## 2020-06-10 MED ORDER — MIDAZOLAM HCL 2 MG/2ML IJ SOLN
INTRAMUSCULAR | Status: AC
  Filled 2020-06-10: qty 2

## 2020-06-10 MED ORDER — SODIUM CHLORIDE 0.9 % WEIGHT BASED INFUSION: 1.0000 mL/kg/h | INTRAVENOUS | Status: DC

## 2020-06-10 MED ORDER — CLOPIDOGREL BISULFATE 75 MG PO TABS
75.0000 mg | ORAL_TABLET | ORAL | Status: AC
  Administered 2020-06-10: 75 mg via ORAL
  Filled 2020-06-10: qty 1

## 2020-06-10 MED ORDER — FISH OIL 1000 MG PO CAPS
1000.0000 mg | ORAL_CAPSULE | Freq: Every day | ORAL | Status: DC
Start: 2020-06-10 — End: 2020-06-10

## 2020-06-10 MED ORDER — SODIUM CHLORIDE 0.9 % IV SOLN
250.0000 mL | INTRAVENOUS | Status: DC | PRN
Start: 2020-06-10 — End: 2020-06-10

## 2020-06-10 MED ORDER — FUROSEMIDE 20 MG PO TABS: 20.0000 mg | ORAL_TABLET | Freq: Two times a day (BID) | ORAL | Status: DC

## 2020-06-10 MED ORDER — ACETAMINOPHEN 325 MG PO TABS: 650.0000 mg | ORAL_TABLET | ORAL | Status: DC | PRN

## 2020-06-10 MED ORDER — POTASSIUM CHLORIDE CRYS ER 20 MEQ PO TBCR
40.0000 meq | EXTENDED_RELEASE_TABLET | Freq: Once | ORAL | Status: AC
  Administered 2020-06-10: 40 meq via ORAL
  Filled 2020-06-10: qty 2

## 2020-06-10 MED ORDER — FENTANYL CITRATE (PF) 100 MCG/2ML IJ SOLN
INTRAMUSCULAR | Status: AC
  Filled 2020-06-10: qty 2

## 2020-06-10 MED ORDER — FINASTERIDE 5 MG PO TABS: 5.0000 mg | ORAL_TABLET | Freq: Every day | ORAL | Status: DC

## 2020-06-10 MED ORDER — HEPARIN (PORCINE) IN NACL 1000-0.9 UT/500ML-% IV SOLN
INTRAVENOUS | Status: DC | PRN
  Administered 2020-06-10 (×2): 500 mL

## 2020-06-10 MED ORDER — BIVALIRUDIN TRIFLUOROACETATE 250 MG IV SOLR
INTRAVENOUS | Status: AC
  Filled 2020-06-10: qty 250

## 2020-06-10 MED ORDER — VALACYCLOVIR HCL 500 MG PO TABS: 500.0000 mg | ORAL_TABLET | Freq: Every day | ORAL | Status: DC | PRN

## 2020-06-10 MED ORDER — MIDAZOLAM HCL 2 MG/2ML IJ SOLN
INTRAMUSCULAR | Status: DC | PRN
  Administered 2020-06-10: 1 mg via INTRAVENOUS

## 2020-06-10 MED ORDER — SODIUM CHLORIDE 0.9 % IV SOLN
INTRAVENOUS | Status: DC | PRN
  Administered 2020-06-10: 1.75 mg/kg/h via INTRAVENOUS

## 2020-06-10 MED ORDER — LIDOCAINE HCL (PF) 1 % IJ SOLN
INTRAMUSCULAR | Status: DC | PRN
  Administered 2020-06-10: 12 mL

## 2020-06-10 MED ORDER — ATORVASTATIN CALCIUM 80 MG PO TABS: 80.0000 mg | ORAL_TABLET | Freq: Every day | ORAL | Status: DC

## 2020-06-10 MED ORDER — SODIUM CHLORIDE 0.9% FLUSH: 3.0000 mL | Freq: Two times a day (BID) | INTRAVENOUS | Status: DC

## 2020-06-10 MED ORDER — ZINC GLUCONATE 50 MG PO TABS: 50.0000 mg | ORAL_TABLET | Freq: Every day | ORAL | Status: DC

## 2020-06-10 MED ORDER — HEPARIN (PORCINE) IN NACL 1000-0.9 UT/500ML-% IV SOLN
INTRAVENOUS | Status: AC
  Filled 2020-06-10: qty 1500

## 2020-06-10 MED ORDER — ONE-DAILY MULTI VITAMINS PO TABS: 1.0000 | ORAL_TABLET | Freq: Every day | ORAL | Status: DC

## 2020-06-10 MED ORDER — BIVALIRUDIN BOLUS VIA INFUSION - CUPID
INTRAVENOUS | Status: DC | PRN
  Administered 2020-06-10: 62.925 mg via INTRAVENOUS

## 2020-06-10 MED ORDER — HEPARIN SODIUM (PORCINE) 1000 UNIT/ML IJ SOLN
INTRAMUSCULAR | Status: AC
  Filled 2020-06-10: qty 1

## 2020-06-10 MED ORDER — FENTANYL CITRATE (PF) 100 MCG/2ML IJ SOLN
INTRAMUSCULAR | Status: DC | PRN
  Administered 2020-06-10: 50 ug via INTRAVENOUS

## 2020-06-10 MED ORDER — SODIUM CHLORIDE 0.9 % IV SOLN: INTRAVENOUS | Status: DC

## 2020-06-10 MED ORDER — DILTIAZEM HCL ER BEADS 240 MG PO CP24: 240.0000 mg | ORAL_CAPSULE | Freq: Every day | ORAL | Status: DC

## 2020-06-10 MED ORDER — ONDANSETRON HCL 4 MG/2ML IJ SOLN: 4.0000 mg | Freq: Four times a day (QID) | INTRAMUSCULAR | Status: DC | PRN

## 2020-06-10 MED ORDER — TAMSULOSIN HCL 0.4 MG PO CAPS: 0.4000 mg | ORAL_CAPSULE | Freq: Every day | ORAL | Status: DC

## 2020-06-10 MED ORDER — HYDRALAZINE HCL 20 MG/ML IJ SOLN
10.0000 mg | INTRAMUSCULAR | Status: DC | PRN
Start: 2020-06-10 — End: 2020-06-10

## 2020-06-10 MED ORDER — CLOPIDOGREL BISULFATE 300 MG PO TABS
ORAL_TABLET | ORAL | Status: AC
  Filled 2020-06-10: qty 1

## 2020-06-10 MED ORDER — IOHEXOL 350 MG/ML SOLN
INTRAVENOUS | Status: DC | PRN
  Administered 2020-06-10: 120 mL via INTRA_ARTERIAL

## 2020-06-10 MED ORDER — ASCORBIC ACID 500 MG PO TABS: 500.0000 mg | ORAL_TABLET | Freq: Every day | ORAL | Status: DC

## 2020-06-10 MED ORDER — ISOSORBIDE MONONITRATE ER 60 MG PO TB24: 60.0000 mg | ORAL_TABLET | Freq: Two times a day (BID) | ORAL | Status: DC

## 2020-06-10 MED ORDER — AMIODARONE HCL 100 MG PO TABS: 100.0000 mg | ORAL_TABLET | Freq: Every day | ORAL | Status: DC

## 2020-06-10 MED ORDER — SODIUM CHLORIDE 0.9% FLUSH: 3.0000 mL | INTRAVENOUS | Status: DC | PRN

## 2020-06-10 MED ORDER — CLOPIDOGREL BISULFATE 75 MG PO TABS: 75.0000 mg | ORAL_TABLET | Freq: Every day | ORAL | Status: DC

## 2020-06-10 MED ORDER — CLOPIDOGREL BISULFATE 300 MG PO TABS
ORAL_TABLET | ORAL | Status: DC | PRN
  Administered 2020-06-10: 300 mg via ORAL

## 2020-06-10 MED ORDER — ASPIRIN 81 MG PO CHEW: 81.0000 mg | CHEWABLE_TABLET | ORAL | Status: DC

## 2020-06-10 MED ORDER — SODIUM CHLORIDE 0.9 % WEIGHT BASED INFUSION
3.0000 mL/kg/h | INTRAVENOUS | Status: AC
  Administered 2020-06-10: 3 mL/kg/h via INTRAVENOUS

## 2020-06-10 MED ORDER — NITROGLYCERIN 0.4 MG SL SUBL: 0.4000 mg | SUBLINGUAL_TABLET | SUBLINGUAL | Status: DC | PRN

## 2020-06-10 MED ORDER — METOPROLOL TARTRATE 12.5 MG HALF TABLET: 25.0000 mg | ORAL_TABLET | Freq: Two times a day (BID) | ORAL | Status: DC

## 2020-06-10 MED ORDER — CARBOXYMETHYLCELLULOSE SODIUM 0.5 % OP SOLN: 1.0000 [drp] | Freq: Every day | OPHTHALMIC | Status: DC | PRN

## 2020-06-10 MED ORDER — IOHEXOL 350 MG/ML SOLN
INTRAVENOUS | Status: AC
  Filled 2020-06-10: qty 1

## 2020-06-10 MED ORDER — SODIUM CHLORIDE 0.9 % IV SOLN: 250.0000 mL | INTRAVENOUS | Status: DC | PRN

## 2020-06-10 MED ORDER — LIDOCAINE HCL (PF) 1 % IJ SOLN
INTRAMUSCULAR | Status: AC
  Filled 2020-06-10: qty 30

## 2020-06-10 SURGICAL SUPPLY — 18 items
BALLN SAPPHIRE 2.5X12 (BALLOONS) ×2
BALLN SAPPHIRE ~~LOC~~ 3.0X12 (BALLOONS) ×1 IMPLANT
BALLN SAPPHIRE ~~LOC~~ 3.25X15 (BALLOONS) ×1 IMPLANT
BALLOON SAPPHIRE 2.5X12 (BALLOONS) IMPLANT
CATH INFINITI 5FR MULTPACK ANG (CATHETERS) ×1 IMPLANT
CATH VISTA GUIDE 6FR JR4 (CATHETERS) ×1 IMPLANT
DEVICE CLOSURE MYNXGRIP 6/7F (Vascular Products) ×1 IMPLANT
KIT ENCORE 26 ADVANTAGE (KITS) ×1 IMPLANT
KIT HEART LEFT (KITS) ×2 IMPLANT
KIT MICROPUNCTURE NIT STIFF (SHEATH) ×1 IMPLANT
PACK CARDIAC CATHETERIZATION (CUSTOM PROCEDURE TRAY) ×2 IMPLANT
SHEATH PINNACLE 5F 10CM (SHEATH) ×1 IMPLANT
SHEATH PINNACLE 6F 10CM (SHEATH) ×1 IMPLANT
SYR MEDRAD MARK 7 150ML (SYRINGE) ×2 IMPLANT
TRANSDUCER W/STOPCOCK (MISCELLANEOUS) ×2 IMPLANT
TUBING CIL FLEX 10 FLL-RA (TUBING) ×2 IMPLANT
WIRE EMERALD 3MM-J .035X150CM (WIRE) ×1 IMPLANT
WIRE RUNTHROUGH .014X180CM (WIRE) ×1 IMPLANT

## 2020-06-10 NOTE — Interval H&P Note (Signed)
History and Physical Interval Note:  06/10/2020 8:04 AM  Eric Beltran.  has presented today for surgery, with the diagnosis of unstable angina.  The various methods of treatment have been discussed with the patient and family. After consideration of risks, benefits and other options for treatment, the patient has consented to  Procedure(s): LEFT HEART CATH AND CORS/GRAFTS ANGIOGRAPHY (N/A) as a surgical intervention.  The patient's history has been reviewed, patient examined, no change in status, stable for surgery.  I have reviewed the patient's chart and labs.  Questions were answered to the patient's satisfaction.     Kathlyn [place]

## 2020-06-10 NOTE — Progress Notes (Signed)
CARDIAC REHAB PHASE I   Discussed PTCA, restrictions, Plavix, diet, exercise, NTG and CRPII. Pt receptive, he is very familiar with diet and exercise. He has done CRPII and is not interested again. Will place referral for [place] CRPII to meet protocol. [place], ACSM 06/10/2020 1:57 PM

## 2020-06-10 NOTE — Discharge Instructions (Signed)
Angiogram, Care After Next dose of plavix 75 mg tomorrow 1/29 Next dose of Eliquis ..... May drive sunday 5/40  This sheet gives you information about how to care for yourself after your procedure. Your health care provider may also give you more specific instructions. If you have problems or questions, contact your health care provider. What can I expect after the procedure? After the procedure, it is common to have:  Bruising and tenderness at the catheter insertion area.  A collection of blood (hematoma) at the insertion area. This may feel like a small lump under the skin at the insertion site. Follow these instructions at home: Insertion site care  Follow instructions from your health care provider about how to take care of your insertion site. Make sure you: ? Wash your hands with soap and water before and after you change your bandage (dressing). If soap and water are not available, use hand sanitizer. ? Change your dressing as told by your health care provider.  Do not take baths, swim, or use a hot tub until your health care provider approves.  You may shower 24-48 hours after the procedure, or as told by your health care provider. To clean the insertion site: ? Gently wash the area with plain soap and water. ? Pat the area dry with a clean towel. ? Do not rub the site. This may cause bleeding.  Check your insertion site every day for signs of infection. Check for: ? Redness, swelling, or pain. ? Fluid or blood. ? Warmth. ? Pus or a bad smell.  Do not apply powder or lotion to the site. Keep the site clean and dry.   Activity  Do not drive for 24 hours if you were given a sedative during your procedure.  Rest as told by your health care provider, usually for 1-2 days.  Do not lift anything that is heavier than 10 lb (4.5 kg), or the limit that you are told, until your health care provider says that it is safe.  If the insertion site was in your leg, try to avoid  stairs for a few days.  Return to your normal activities as told by your health care provider, usually in about a week. Ask your health care provider what activities are safe for you. General instructions  If your insertion site starts bleeding, lie flat and put pressure on the site. If the bleeding does not stop, get help right away. This is a medical emergency.  Take over-the-counter and prescription medicines only as told by your health care provider.  Drink enough fluid to keep your urine pale yellow. This helps flush the contrast dye from your body.  Keep all follow-up visits as told by your health care provider. This is important.   Contact a health care provider if:  You have a fever or chills.  You have redness, swelling, or pain around your insertion site.  You have fluid or blood coming from your insertion site.  Your insertion site feels warm to the touch.  You have pus or a bad smell coming from your insertion site.  You have more bruising around the insertion site. Get help right away if you have:  A problem with the insertion area, such as: ? The area swells fast or bleeds even after you apply pressure. ? The area becomes pale, cool, tingly, or numb.  Chest pain.  Trouble breathing.  A rash.  Any symptoms of a stroke. "BE FAST" is an easy way to  remember the main warning signs of a stroke: ? B - Balance. Signs are dizziness, sudden trouble walking, or loss of balance. ? E - Eyes. Signs are trouble seeing or a sudden change in vision. ? F - Face. Signs are sudden weakness or loss of feeling of the face, or the face or eyelid drooping on one side. ? A - Arms. Signs are weakness or loss of feeling in an arm. This happens suddenly and usually on one side of the body. ? S - Speech. Signs are sudden trouble speaking, slurred speech, or trouble understanding what people say. ? T - Time. Time to call emergency services. Write down what time symptoms started.  You  have other signs of a stroke, such as: ? A sudden, severe headache with no known cause. ? Nausea or vomiting. ? Seizure. These symptoms may represent a serious problem that is an emergency. Do not wait to see if the symptoms will go away. Get medical help right away. Call your local emergency services (911 in the [place] Do not drive yourself to the hospital. Summary  It is common to have bruising and tenderness at the catheter insertion area.  Do not take baths, swim, or use a hot tub until your health care provider approves. You may shower 24-48 hours after the procedure or as told.  It is important to rest and drink plenty of fluids.  If the insertion site bleeds, lie flat and put pressure on the site. If the bleeding continues, get help right away. This is a medical emergency. This information is not intended to replace advice given to you by your health care provider. Make sure you discuss any questions you have with your health care provider. Document Revised: 03/04/2019 Document Reviewed: 03/04/2019 Elsevier Patient Education  [place].

## 2020-06-10 NOTE — Discharge Summary (Signed)
Discharge Summary for Same Day PCI   Patient ID: Eric Beltran. MRN: [id number]; DOB: 11-17-1940  Admit date: 06/10/2020 Discharge date: 06/10/2020  Primary Care Provider: Celene Squibb, MD  Primary Cardiologist: Rozann Lesches, MD  Primary Electrophysiologist:  None   Discharge Diagnoses    Active Problems:   Unstable angina [facility])    Diagnostic Studies/Procedures    Cardiac Catheterization 06/10/2020:    Prox LAD-1 lesion is 80% stenosed.  Prox LAD-2 lesion is 70% stenosed.  Ost LAD lesion is 30% stenosed.  Ost Cx to Prox Cx lesion is 100% stenosed.  Prox RCA to Mid RCA lesion is 30% stenosed.  Previously placed RPAV drug eluting stent is widely patent.  Balloon angioplasty was performed.  Ost RPDA lesion is 65% stenosed.  Balloon angioplasty was performed.  1st RPL lesion is 50% stenosed.  Origin lesion before RPDA is 100% stenosed.  Origin to Prox Graft lesion is 100% stenosed.  The left ventricular systolic function is normal.  LV end diastolic pressure is normal.  The left ventricular ejection fraction is 55-65% by visual estimate.  Previously placed Mid RCA drug eluting stent is widely patent.  Previously placed Dist RCA stent (unknown type) is widely patent.  Mid RCA to Dist RCA lesion is 90% stenosed.  Post intervention, there is a 15% residual stenosis.  Balloon angioplasty was performed using a BALLOON SAPPHIRE [place] 3.25X15.   1.  Severe underlying three-vessel coronary artery disease with patent LIMA to LAD.  All other grafts are known to be occluded.  Patent stents in the right coronary artery.  However, there is severe focal in-stent restenosis likely in the overlap area of the 2 stents.  I could not determine if there is a few millimeter gap between the 2 stents. 2.  Normal LV systolic function normal left ventricular end-diastolic pressure. 3.  Successful balloon angioplasty to the right coronary artery using a 3.25 noncompliant balloon  to 20 atm.  I elected not to add another stent given previously placed stents to avoid multiple layers.  However, if there is restenosis again in this area, it should be treated with adding another drug-eluting stent.  Recommendations: Continue clopidogrel without aspirin given that he is on Eliquis. Eliquis can be resumed tomorrow if there is no bleeding issues. The patient is a candidate for same-day discharge.  Diagnostic Dominance: Right    Intervention     _____________   History of Present Illness     Eric Beltran. is a 80 y.o. male with PMH of CAD s/p CABG (LIMA-LAD, SVG-PLB, SVG-[place], SVG-OM), HTN, PAD, PAF, HLD who presented to the back office on 1/27 describing a 4-day history of progressive angina.  Symptoms have been occurring with various activities during the daytime, responsive to nitroglycerin.  He had been walking 15 to 20 minutes at a time, but could only walk for 2 minutes yesterday without having angina and had to stop.  He had symptoms last night.  He did call the office the day prior and was encouraged to go to the ER for further assessment, but did not want to do this given concerns about the pandemic and crowding. Dr. Domenic Polite saw the patient and recommended that he undergo cardiac cath.   Hospital Course     The patient underwent cardiac cath as noted above with severe underlying 3v CAD with LIMA-LAD, with all other grafts occluded. Patent stents in the RCA with focal in stent restenosis. Treated with successful balloon angioplasty. Plan  for plavix without ASA given the need for Eliquis. The patient was seen by cardiac rehab while in short stay. There were no observed complications post cath. Femoral cath site was re-evaluated prior to discharge and found to be stable without any complications. Instructions/precautions regarding cath site care were given prior to discharge.  Eric Beltran. was seen by Dr. Fletcher Anon and determined stable for discharge home.  Follow up with our office has been arranged. Medications are listed below. Pertinent changes include N/a.  _____________  Cath/PCI Registry Performance & Quality Measures: 1. Aspirin prescribed? - No - on Eliquis 2. ADP Receptor Inhibitor (Plavix/Clopidogrel, Brilinta/Ticagrelor or Effient/Prasugrel) prescribed (includes medically managed patients)? - Yes 3. High Intensity Statin (Lipitor 40-20m or Crestor 20-453m prescribed? - Yes 4. For EF <40%, was ACEI/ARB prescribed? - Not Applicable (EF >/= 4037%5. For EF <40%, Aldosterone Antagonist (Spironolactone or Eplerenone) prescribed? - Not Applicable (EF >/= 4034%6. Cardiac Rehab Phase II ordered (Included Medically managed Patients)? - Yes  _____________   Discharge Vitals Blood pressure (!) 166/74, pulse (!) 51, temperature 97.6 F (36.4 C), temperature source Oral, resp. rate 14, height 5' 7" (1.702 m), weight 83.9 kg, SpO2 98 %.  Filed Weights   06/10/20 0538  Weight: 83.9 kg    Last Labs & Radiologic Studies    CBC Recent Labs    06/09/20 1507  WBC 10.0  HGB 13.9  HCT 43.2  MCV 87.6  PLT 23287 Basic Metabolic Panel Recent Labs    06/09/20 1507  NA 137  K 3.4*  CL 106  CO2 23  GLUCOSE 102*  BUN 17  CREATININE 0.92  CALCIUM 8.8*   Liver Function Tests No results for input(s): AST, ALT, ALKPHOS, BILITOT, PROT, ALBUMIN in the last 72 hours. No results for input(s): LIPASE, AMYLASE in the last 72 hours. High Sensitivity Troponin:   No results for input(s): TROPONINIHS in the last 720 hours.  BNP Invalid input(s): POCBNP D-Dimer No results for input(s): DDIMER in the last 72 hours. Hemoglobin A1C No results for input(s): HGBA1C in the last 72 hours. Fasting Lipid Panel No results for input(s): CHOL, HDL, LDLCALC, TRIG, CHOLHDL, LDLDIRECT in the last 72 hours. Thyroid Function Tests No results for input(s): TSH, T4TOTAL, T3FREE, THYROIDAB in the last 72 hours.  Invalid input(s): FREET3 _____________   CARDIAC CATHETERIZATION  Result Date: 06/10/2020  Prox LAD-1 lesion is 80% stenosed.  Prox LAD-2 lesion is 70% stenosed.  Ost LAD lesion is 30% stenosed.  Ost Cx to Prox Cx lesion is 100% stenosed.  Prox RCA to Mid RCA lesion is 30% stenosed.  Previously placed RPAV drug eluting stent is widely patent.  Balloon angioplasty was performed.  Ost RPDA lesion is 65% stenosed.  Balloon angioplasty was performed.  1st RPL lesion is 50% stenosed.  Origin lesion before RPDA is 100% stenosed.  Origin to Prox Graft lesion is 100% stenosed.  The left ventricular systolic function is normal.  LV end diastolic pressure is normal.  The left ventricular ejection fraction is 55-65% by visual estimate.  Previously placed Mid RCA drug eluting stent is widely patent.  Previously placed Dist RCA stent (unknown type) is widely patent.  Mid RCA to Dist RCA lesion is 90% stenosed.  Post intervention, there is a 15% residual stenosis.  Balloon angioplasty was performed using a BALLOON SAPPHIRE [place] 3.25X15.  1.  Severe underlying three-vessel coronary artery disease with patent LIMA to LAD.  All other grafts are known to be  occluded.  Patent stents in the right coronary artery.  However, there is severe focal in-stent restenosis likely in the overlap area of the 2 stents.  I could not determine if there is a few millimeter gap between the 2 stents. 2.  Normal LV systolic function normal left ventricular end-diastolic pressure. 3.  Successful balloon angioplasty to the right coronary artery using a 3.25 noncompliant balloon to 20 atm.  I elected not to add another stent given previously placed stents to avoid multiple layers.  However, if there is restenosis again in this area, it should be treated with adding another drug-eluting stent. Recommendations: Continue clopidogrel without aspirin given that he is on Eliquis. Eliquis can be resumed tomorrow if there is no bleeding issues. The patient is a candidate for  same-day discharge.   XR C-ARM NO REPORT  Result Date: 05/19/2020 Please see Notes tab for imaging impression.   Disposition   Pt is being discharged home today in good condition.  Follow-up Plans & Appointments     Follow-up Information    Satira Sark, MD Follow up on 06/20/2020.   Specialty: Cardiology Why: at 3:20pm for your follow up appt Contact information: [place] [address] [phone]              Discharge Instructions    AMB Referral to Cardiac Rehabilitation - Phase II   Complete by: As directed    Diagnosis: Coronary Stents   After initial evaluation and assessments completed: Virtual Based Care may be provided alone or in conjunction with Phase 2 Cardiac Rehab based on patient barriers.: Yes       Discharge Medications   Allergies as of 06/10/2020   No Known Allergies     Medication List    TAKE these medications   amiodarone 200 MG tablet Commonly known as: PACERONE TAKE 1/2 TABLET BY MOUTH DAILY (cut in half) What changed:   how much to take  how to take this  when to take this  additional instructions   apixaban 5 MG Tabs tablet Commonly known as: ELIQUIS Take 5 mg by mouth 2 (two) times daily.   atorvastatin 80 MG tablet Commonly known as: LIPITOR Take 1 tablet (80 mg total) by mouth at bedtime.   carboxymethylcellulose 0.5 % Soln Commonly known as: REFRESH PLUS Place 1 drop into both eyes daily as needed (Dry eyes).   clopidogrel 75 MG tablet Commonly known as: PLAVIX Take 1 tablet (75 mg total) by mouth daily with breakfast. Notes to patient: Next dose 1/29   diltiazem 240 MG 24 hr capsule Commonly known as: TIAZAC Take 240 mg by mouth daily.   finasteride 5 MG tablet Commonly known as: PROSCAR Take 5 mg by mouth at bedtime.   Fish Oil 1000 MG Caps Take 1,000-2,000 mg by mouth at bedtime.   furosemide 20 MG tablet Commonly known as: LASIX TAKE TWO TABLETS BY MOUTH DAILY What  changed:   how much to take  when to take this   isosorbide mononitrate 30 MG 24 hr tablet Commonly known as: IMDUR Take 2 tablets (60 mg total) by mouth in the morning. & 30 mg (1 tablet) in the evening What changed:   when to take this  additional instructions   metoprolol tartrate 25 MG tablet Commonly known as: LOPRESSOR TAKE 1 TABLET BY MOUTH TWICE DAILY   multivitamin tablet Take 1 tablet by mouth at bedtime.   nitroGLYCERIN 0.4 MG SL tablet Commonly known  as: NITROSTAT DISSOLVE 1 TABLET UNDER THE TONGUE EVERY 5 MINUTES AS NEEDED FOR CHEST PAIN. DO NOT EXCEED A TOTAL OF 3 DOSES IN 15 MINUTES. What changed: See the new instructions.   tamsulosin 0.4 MG Caps capsule Commonly known as: FLOMAX Take 0.4 mg by mouth at bedtime.   triamcinolone ointment 0.1 % Commonly known as: KENALOG Apply 1 application topically daily as needed (itching).   valACYclovir 500 MG tablet Commonly known as: VALTREX Take 500 mg by mouth daily as needed (fever blisters).   vitamin C 500 MG tablet Commonly known as: ASCORBIC ACID Take 500 mg by mouth daily.   zinc gluconate 50 MG tablet Take 50 mg by mouth at bedtime.          Allergies No Known Allergies  Outstanding Labs/Studies   N/a   Duration of Discharge Encounter   Greater than 30 minutes including physician time.  Signed, Reino Bellis, NP 06/10/2020, 2:15 PM

## 2020-06-13 ENCOUNTER — Encounter (HOSPITAL_COMMUNITY): Payer: Self-pay | Admitting: Cardiovascular Disease

## 2020-06-13 ENCOUNTER — Other Ambulatory Visit: Payer: Self-pay | Admitting: Cardiology

## 2020-06-13 MED ORDER — TAMSULOSIN 0.4 MG CAPSULE
ORAL_CAPSULE | 3 refills | 0 days | Status: CP
Start: 2020-06-13 — End: ?

## 2020-06-13 NOTE — Progress Notes (Signed)
Same Day PCI D/C Phone Call   [x]   1st call         [x]   Answer.         []   No Answer    Is there bleeding or other abnormality from access site?  []   Yes    [x]   No  Has there been any chest pain, nausea or vomiting?    [x]   No  []   Yes chest pain   []   Yes nausea or vomiting  Have you sought any medical attention since discharge?    []   Yes  [x]   No  Remind Patient about their follow up appointment, review discharge & medication instructions.   [x]   Yes  []   No  Are you continuing to take you daily ASA & antiplatlet inhibitor(Plavix, Effient, Brilinitia)?  [x]   Yes  []   No  Would you say you were satisfied with you Same Day Discharge?  (Disagree, Neutral, Agree)   []   Disagree, why  []   Neutral  [x]   Agree

## 2020-06-14 ENCOUNTER — Other Ambulatory Visit: Payer: Self-pay

## 2020-06-14 ENCOUNTER — Encounter: Payer: Self-pay | Admitting: Physical Medicine and Rehabilitation

## 2020-06-14 ENCOUNTER — Ambulatory Visit: Payer: Self-pay

## 2020-06-14 ENCOUNTER — Ambulatory Visit (INDEPENDENT_AMBULATORY_CARE_PROVIDER_SITE_OTHER): Payer: Medicare Other | Admitting: Physical Medicine and Rehabilitation

## 2020-06-14 VITALS — BP 154/76 | HR 64

## 2020-06-14 DIAGNOSIS — M5416 Radiculopathy, lumbar region: Secondary | ICD-10-CM | POA: Diagnosis not present

## 2020-06-14 MED ORDER — BETAMETHASONE SOD PHOS & ACET 6 (3-3) MG/ML IJ SUSP
12.0000 mg | Freq: Once | INTRAMUSCULAR | Status: AC
Start: 2020-06-14 — End: 2020-06-14
  Administered 2020-06-14: 12 mg

## 2020-06-14 NOTE — Progress Notes (Signed)
Eric Beltran. - 80 y.o. male MRN [id number]  Date of birth: [birth year]  Office Visit Note: Visit Date: 06/14/2020 PCP: Celene Squibb, MD Referred by: Celene Squibb, MD  Subjective: Chief Complaint  Patient presents with  . Right Leg - Pain  . Right Knee - Pain   HPI:  Eric Beltran. is a 80 y.o. male who comes in today  for planned Right S1-2 Lumbar epidural steroid injection with fluoroscopic guidance.  The patient has failed conservative care including home exercise, medications, time and activity modification.  This injection will be diagnostic and hopefully therapeutic.  Please see requesting physician notes for further details and justification.   If no relief consider right ischial bursa injection.  ROS Otherwise per HPI.  Assessment & Plan: Visit Diagnoses:    ICD-10-CM   1. Lumbar radiculopathy  M54.16 XR C-ARM NO REPORT    Epidural Steroid injection    betamethasone acetate-betamethasone sodium phosphate (CELESTONE) injection 12 mg    Plan: No additional findings.   Meds & Orders:  Meds ordered this encounter  Medications  . betamethasone acetate-betamethasone sodium phosphate (CELESTONE) injection 12 mg    Orders Placed This Encounter  Procedures  . XR C-ARM NO REPORT  . Epidural Steroid injection    Follow-up: Return if symptoms worsen or fail to improve.   Procedures: No procedures performed  S1 Lumbosacral Transforaminal Epidural Steroid Injection - Sub-Pedicular Approach with Fluoroscopic Guidance   Patient: Eric Beltran.      Date of Birth: 1941/03/24 MRN: [id number] PCP: Celene Squibb, MD      Visit Date: 06/14/2020   Universal Protocol:    Date/Time: 02/01/221:17 PM  Consent Given By: the patient  Position:  PRONE  Additional Comments: Vital signs were monitored before and after the procedure. Patient was prepped and draped in the usual sterile fashion. The correct patient, procedure, and site was verified.   Injection  Procedure Details:  Procedure Site One Meds Administered:  Meds ordered this encounter  Medications  . betamethasone acetate-betamethasone sodium phosphate (CELESTONE) injection 12 mg    Laterality: Right  Location/Site:  S1 Foramen   Needle size: 22 ga.  Needle type: Spinal  Needle Placement: Transforaminal  Findings:   -Comments: Excellent flow of contrast along the nerve, nerve root and into the epidural space.  Epidurogram: Contrast epidurogram showed no nerve root cut off or restricted flow pattern.  Procedure Details: After squaring off the sacral end-plate to get a true AP view, the C-arm was positioned so that the best possible view of the S1 foramen was visualized. The soft tissues overlying this structure were infiltrated with 2-3 ml. of 1% Lidocaine without Epinephrine.    The spinal needle was inserted toward the target using a "trajectory" view along the fluoroscope beam.  Under AP and lateral visualization, the needle was advanced so it did not puncture dura. Biplanar projections were used to confirm position. Aspiration was confirmed to be negative for CSF and/or blood. A 1-2 ml. volume of Isovue-250 was injected and flow of contrast was noted at each level. Radiographs were obtained for documentation purposes.   After attaining the desired flow of contrast documented above, a 0.5 to 1.0 ml test dose of 0.25% Marcaine was injected into each respective transforaminal space.  The patient was observed for 90 seconds post injection.  After no sensory deficits were reported, and normal lower extremity motor function was noted,   the above injectate was administered  so that equal amounts of the injectate were placed at each foramen (level) into the transforaminal epidural space.   Additional Comments:  The patient tolerated the procedure well Dressing: Band-Aid with 2 x 2 sterile gauze    Post-procedure details: Patient was observed during the  procedure. Post-procedure instructions were reviewed.  Patient left the clinic in stable condition.     Clinical History: CLINICAL DATA:  80 year old male with mid to low back pain radiating to both legs down to the feet. Chronic but progressive symptoms with no known injury.  EXAM: MRI LUMBAR SPINE WITHOUT CONTRAST  TECHNIQUE: Multiplanar, multisequence MR imaging of the lumbar spine was performed. No intravenous contrast was administered.  COMPARISON:  Lumbar radiographs 04/14/2018. Report of [facility] lumbar MRI 03/02/2015 (no images available).  FINDINGS: Segmentation:  Normal on the comparison radiographs.  Alignment: Stable. Grade 1 anterolisthesis of L4 on L5 measuring 4-5 millimeters. Mild retrolisthesis of L2 on L3 and L1 on L2.  Vertebrae: Chronic degenerative endplate marrow signal changes at L1-L2 and L2-L3. No marrow edema or evidence of acute osseous abnormality. Intact visible sacrum and SI joints.  Conus medullaris and cauda equina: Conus extends to the T12-L1 level. No lower spinal cord or conus signal abnormality.  Paraspinal and other soft tissues: Negative.  Disc levels:  T11-T12: Mild to moderate facet hypertrophy and bilateral T11 foraminal stenosis.  T12-L1:  Negative.  L1-L2: Retrolisthesis with disc space loss, left eccentric circumferential disc bulge and endplate spurring. Mild facet hypertrophy. Moderate bilateral L1 foraminal stenosis.  L2-L3: Retrolisthesis with disc space loss and circumferential disc osteophyte complex. Superimposed small right paracentral disc protrusion (series 10, image 16). Mild to moderate facet and ligament flavum hypertrophy. Moderate spinal stenosis. Mild bilateral lateral recess stenosis (L3 nerve levels). Moderate to severe bilateral L2 foraminal stenosis greater on the right.  L3-L4: Subtle anterolisthesis. Circumferential disc bulge. Moderate facet and ligament flavum  hypertrophy. Mild bilateral lateral recess stenosis (L4 nerve levels). Moderate bilateral L3 foraminal stenosis. No significant spinal stenosis.  L4-L5: Grade 1 anterolisthesis with circumferential disc/pseudo disc. Severe facet hypertrophy. Moderate ligament flavum hypertrophy greater on the left. No significant spinal stenosis. Moderate left and mild right lateral recess stenosis (L5 nerve levels). Moderate to severe bilateral L4 foraminal stenosis, greater on the right.  L5-S1: Moderate facet hypertrophy. No spinal or lateral recess stenosis. Mild left and mild to moderate right L5 foraminal stenosis.  IMPRESSION: 1. Multilevel lumbar spondylolisthesis, most pronounced at L4-L5, with no acute osseous abnormality. 2. Moderate multifactorial spinal stenosis at L2-L3 where there is advanced disc and endplate degeneration. Moderate to severe bilateral L2 foraminal stenosis greater on the right. 3. Up to moderate L4-L5 lateral recess stenosis greater on the left and moderate to severe bilateral L4 foraminal stenosis. 4. Moderate bilateral L3 and right L5 neural foraminal stenosis.   Electronically Signed   By: Genevie Ann M.D.   On: 05/12/2018 14:32     Objective:  VS:  HT:    WT:   BMI:     BP:(!) 154/76  HR:64bpm  TEMP: ( )  RESP:  Physical Exam Vitals and nursing note reviewed.  Constitutional:      General: He is not in acute distress.    Appearance: Normal appearance. He is not ill-appearing.  HENT:     Head: Normocephalic and atraumatic.     Right Ear: External ear normal.     Left Ear: External ear normal.     Nose: No congestion.  Eyes:  Extraocular Movements: Extraocular movements intact.  Cardiovascular:     Rate and Rhythm: Normal rate.     Pulses: Normal pulses.  Pulmonary:     Effort: Pulmonary effort is normal. No respiratory distress.  Abdominal:     General: There is no distension.     Palpations: Abdomen is soft.  Musculoskeletal:         General: No tenderness or signs of injury.     Cervical back: Neck supple.     Right lower leg: No edema.     Left lower leg: No edema.     Comments: Patient has good distal strength without clonus.  Skin:    Findings: No erythema or rash.  Neurological:     General: No focal deficit present.     Mental Status: He is alert and oriented to person, place, and time.     Sensory: No sensory deficit.     Motor: No weakness or abnormal muscle tone.     Coordination: Coordination normal.  Psychiatric:        Mood and Affect: Mood normal.        Behavior: Behavior normal.      Imaging: No results found.

## 2020-06-14 NOTE — Procedures (Signed)
S1 Lumbosacral Transforaminal Epidural Steroid Injection - Sub-Pedicular Approach with Fluoroscopic Guidance   Patient: Eric Beltran.      Date of Birth: June 14, 1940 MRN: [id number] PCP: Celene Squibb, MD      Visit Date: 06/14/2020   Universal Protocol:    Date/Time: 02/01/221:17 PM  Consent Given By: the patient  Position:  PRONE  Additional Comments: Vital signs were monitored before and after the procedure. Patient was prepped and draped in the usual sterile fashion. The correct patient, procedure, and site was verified.   Injection Procedure Details:  Procedure Site One Meds Administered:  Meds ordered this encounter  Medications  . betamethasone acetate-betamethasone sodium phosphate (CELESTONE) injection 12 mg    Laterality: Right  Location/Site:  S1 Foramen   Needle size: 22 ga.  Needle type: Spinal  Needle Placement: Transforaminal  Findings:   -Comments: Excellent flow of contrast along the nerve, nerve root and into the epidural space.  Epidurogram: Contrast epidurogram showed no nerve root cut off or restricted flow pattern.  Procedure Details: After squaring off the sacral end-plate to get a true AP view, the C-arm was positioned so that the best possible view of the S1 foramen was visualized. The soft tissues overlying this structure were infiltrated with 2-3 ml. of 1% Lidocaine without Epinephrine.    The spinal needle was inserted toward the target using a "trajectory" view along the fluoroscope beam.  Under AP and lateral visualization, the needle was advanced so it did not puncture dura. Biplanar projections were used to confirm position. Aspiration was confirmed to be negative for CSF and/or blood. A 1-2 ml. volume of Isovue-250 was injected and flow of contrast was noted at each level. Radiographs were obtained for documentation purposes.   After attaining the desired flow of contrast documented above, a 0.5 to 1.0 ml test dose of 0.25%  Marcaine was injected into each respective transforaminal space.  The patient was observed for 90 seconds post injection.  After no sensory deficits were reported, and normal lower extremity motor function was noted,   the above injectate was administered so that equal amounts of the injectate were placed at each foramen (level) into the transforaminal epidural space.   Additional Comments:  The patient tolerated the procedure well Dressing: Band-Aid with 2 x 2 sterile gauze    Post-procedure details: Patient was observed during the procedure. Post-procedure instructions were reviewed.  Patient left the clinic in stable condition.

## 2020-06-14 NOTE — Patient Instructions (Signed)

## 2020-06-14 NOTE — Procedures (Signed)
Lumbosacral Transforaminal Epidural Steroid Injection - Sub-Pedicular Approach with Fluoroscopic Guidance  Patient: Eric Beltran.      Date of Birth: [birth year] MRN: [id number] PCP: Celene Squibb, MD      Visit Date: 05/19/2020   Universal Protocol:    Date/Time: 05/19/2020  Consent Given By: the patient  Position: PRONE  Additional Comments: Vital signs were monitored before and after the procedure. Patient was prepped and draped in the usual sterile fashion. The correct patient, procedure, and site was verified.   Injection Procedure Details:   Procedure diagnoses: Lumbar radiculopathy [M54.16]    Meds Administered:  Meds ordered this encounter  Medications  . methylPREDNISolone acetate (DEPO-MEDROL) injection 80 mg    Laterality: Right  Location/Site:  L5-S1  Needle:5.0 in., 22 ga.  Short bevel or Quincke spinal needle  Needle Placement: Transforaminal  Findings:    -Comments: Excellent flow of contrast along the nerve, nerve root and into the epidural space.  Procedure Details: After squaring off the end-plates to get a true AP view, the C-arm was positioned so that an oblique view of the foramen as noted above was visualized. The target area is just inferior to the "nose of the scotty dog" or sub pedicular. The soft tissues overlying this structure were infiltrated with 2-3 ml. of 1% Lidocaine without Epinephrine.  The spinal needle was inserted toward the target using a "trajectory" view along the fluoroscope beam.  Under AP and lateral visualization, the needle was advanced so it did not puncture dura and was located close the 6 O'Clock position of the pedical in AP tracterory. Biplanar projections were used to confirm position. Aspiration was confirmed to be negative for CSF and/or blood. A 1-2 ml. volume of Isovue-250 was injected and flow of contrast was noted at each level. Radiographs were obtained for documentation purposes.   After attaining the  desired flow of contrast documented above, a 0.5 to 1.0 ml test dose of 0.25% Marcaine was injected into each respective transforaminal space.  The patient was observed for 90 seconds post injection.  After no sensory deficits were reported, and normal lower extremity motor function was noted,   the above injectate was administered so that equal amounts of the injectate were placed at each foramen (level) into the transforaminal epidural space.   Additional Comments:  The patient tolerated the procedure well Dressing: 2 x 2 sterile gauze and Band-Aid    Post-procedure details: Patient was observed during the procedure. Post-procedure instructions were reviewed.  Patient left the clinic in stable condition.

## 2020-06-14 NOTE — Progress Notes (Signed)
Pt state right buttocks pain that travels down his right leg. Pt state walking, standing and bending makes the pain worse. Pt state he takes over the counter pain meds to help ease the pain. Pt has hx of inj on 05/19/20 pt state it didn't work.  Numeric Pain Rating Scale and Functional Assessment Average Pain 6   In the last MONTH (on 0-10 scale) has pain interfered with the following?  1. General activity like being  able to carry out your everyday physical activities such as walking, climbing stairs, carrying groceries, or moving a chair?  Rating(8)   +Driver, +BT, -Dye Allergies.

## 2020-06-14 NOTE — Progress Notes (Signed)
Eric Beltran. - 80 y.o. male MRN [id number]  Date of birth: 06-25-1940  Office Visit Note: Visit Date: 05/19/2020 PCP: Celene Squibb, MD Referred by: Celene Squibb, MD  Subjective: Chief Complaint  Patient presents with  . Lower Back - Pain  . Right Knee - Pain  . Right Foot - Pain  . Right Leg - Pain   HPI:  Eric Beltran. is a 80 y.o. male who comes in today for planned repeat Right L5-S1 Lumbar epidural steroid injection with fluoroscopic guidance.  The patient has failed conservative care including home exercise, medications, time and activity modification.  This injection will be diagnostic and hopefully therapeutic.  Please see requesting physician notes for further details and justification. Patient received more than 50% pain relief from prior injection.   Referring: Dr. Jean Rosenthal   ROS Otherwise per HPI.  Assessment & Plan: Visit Diagnoses:    ICD-10-CM   1. Lumbar radiculopathy  M54.16 XR C-ARM NO REPORT    Epidural Steroid injection    methylPREDNISolone acetate (DEPO-MEDROL) injection 80 mg    Plan: No additional findings.   Meds & Orders:  Meds ordered this encounter  Medications  . methylPREDNISolone acetate (DEPO-MEDROL) injection 80 mg    Orders Placed This Encounter  Procedures  . XR C-ARM NO REPORT  . Epidural Steroid injection    Follow-up: Return if symptoms worsen or fail to improve.   Procedures: No procedures performed  Lumbosacral Transforaminal Epidural Steroid Injection - Sub-Pedicular Approach with Fluoroscopic Guidance  Patient: Eric Beltran.      Date of Birth: [birth year] MRN: [id number] PCP: Celene Squibb, MD      Visit Date: 05/19/2020   Universal Protocol:    Date/Time: 05/19/2020  Consent Given By: the patient  Position: PRONE  Additional Comments: Vital signs were monitored before and after the procedure. Patient was prepped and draped in the usual sterile fashion. The correct patient, procedure,  and site was verified.   Injection Procedure Details:   Procedure diagnoses: Lumbar radiculopathy [M54.16]    Meds Administered:  Meds ordered this encounter  Medications  . methylPREDNISolone acetate (DEPO-MEDROL) injection 80 mg    Laterality: Right  Location/Site:  L5-S1  Needle:5.0 in., 22 ga.  Short bevel or Quincke spinal needle  Needle Placement: Transforaminal  Findings:    -Comments: Excellent flow of contrast along the nerve, nerve root and into the epidural space.  Procedure Details: After squaring off the end-plates to get a true AP view, the C-arm was positioned so that an oblique view of the foramen as noted above was visualized. The target area is just inferior to the "nose of the scotty dog" or sub pedicular. The soft tissues overlying this structure were infiltrated with 2-3 ml. of 1% Lidocaine without Epinephrine.  The spinal needle was inserted toward the target using a "trajectory" view along the fluoroscope beam.  Under AP and lateral visualization, the needle was advanced so it did not puncture dura and was located close the 6 O'Clock position of the pedical in AP tracterory. Biplanar projections were used to confirm position. Aspiration was confirmed to be negative for CSF and/or blood. A 1-2 ml. volume of Isovue-250 was injected and flow of contrast was noted at each level. Radiographs were obtained for documentation purposes.   After attaining the desired flow of contrast documented above, a 0.5 to 1.0 ml test dose of 0.25% Marcaine was injected into each respective transforaminal space.  The patient was observed for 90 seconds post injection.  After no sensory deficits were reported, and normal lower extremity motor function was noted,   the above injectate was administered so that equal amounts of the injectate were placed at each foramen (level) into the transforaminal epidural space.   Additional Comments:  The patient tolerated the procedure  well Dressing: 2 x 2 sterile gauze and Band-Aid    Post-procedure details: Patient was observed during the procedure. Post-procedure instructions were reviewed.  Patient left the clinic in stable condition.      Clinical History: CLINICAL DATA:  80 year old male with mid to low back pain radiating to both legs down to the feet. Chronic but progressive symptoms with no known injury.  EXAM: MRI LUMBAR SPINE WITHOUT CONTRAST  TECHNIQUE: Multiplanar, multisequence MR imaging of the lumbar spine was performed. No intravenous contrast was administered.  COMPARISON:  Lumbar radiographs 04/14/2018. Report of [facility] lumbar MRI 03/02/2015 (no images available).  FINDINGS: Segmentation:  Normal on the comparison radiographs.  Alignment: Stable. Grade 1 anterolisthesis of L4 on L5 measuring 4-5 millimeters. Mild retrolisthesis of L2 on L3 and L1 on L2.  Vertebrae: Chronic degenerative endplate marrow signal changes at L1-L2 and L2-L3. No marrow edema or evidence of acute osseous abnormality. Intact visible sacrum and SI joints.  Conus medullaris and cauda equina: Conus extends to the T12-L1 level. No lower spinal cord or conus signal abnormality.  Paraspinal and other soft tissues: Negative.  Disc levels:  T11-T12: Mild to moderate facet hypertrophy and bilateral T11 foraminal stenosis.  T12-L1:  Negative.  L1-L2: Retrolisthesis with disc space loss, left eccentric circumferential disc bulge and endplate spurring. Mild facet hypertrophy. Moderate bilateral L1 foraminal stenosis.  L2-L3: Retrolisthesis with disc space loss and circumferential disc osteophyte complex. Superimposed small right paracentral disc protrusion (series 10, image 16). Mild to moderate facet and ligament flavum hypertrophy. Moderate spinal stenosis. Mild bilateral lateral recess stenosis (L3 nerve levels). Moderate to severe bilateral L2 foraminal stenosis greater on  the right.  L3-L4: Subtle anterolisthesis. Circumferential disc bulge. Moderate facet and ligament flavum hypertrophy. Mild bilateral lateral recess stenosis (L4 nerve levels). Moderate bilateral L3 foraminal stenosis. No significant spinal stenosis.  L4-L5: Grade 1 anterolisthesis with circumferential disc/pseudo disc. Severe facet hypertrophy. Moderate ligament flavum hypertrophy greater on the left. No significant spinal stenosis. Moderate left and mild right lateral recess stenosis (L5 nerve levels). Moderate to severe bilateral L4 foraminal stenosis, greater on the right.  L5-S1: Moderate facet hypertrophy. No spinal or lateral recess stenosis. Mild left and mild to moderate right L5 foraminal stenosis.  IMPRESSION: 1. Multilevel lumbar spondylolisthesis, most pronounced at L4-L5, with no acute osseous abnormality. 2. Moderate multifactorial spinal stenosis at L2-L3 where there is advanced disc and endplate degeneration. Moderate to severe bilateral L2 foraminal stenosis greater on the right. 3. Up to moderate L4-L5 lateral recess stenosis greater on the left and moderate to severe bilateral L4 foraminal stenosis. 4. Moderate bilateral L3 and right L5 neural foraminal stenosis.   Electronically Signed   By: Genevie Ann M.D.   On: 05/12/2018 14:32     Objective:  VS:  HT:    WT:   BMI:     BP:140/71  HR:(!) 58bpm  TEMP: ( )  RESP:  Physical Exam Vitals and nursing note reviewed.  Constitutional:      General: He is not in acute distress.    Appearance: Normal appearance. He is not ill-appearing.  HENT:  Head: Normocephalic and atraumatic.     Right Ear: External ear normal.     Left Ear: External ear normal.     Nose: No congestion.  Eyes:     Extraocular Movements: Extraocular movements intact.  Cardiovascular:     Rate and Rhythm: Normal rate.     Pulses: Normal pulses.  Pulmonary:     Effort: Pulmonary effort is normal. No respiratory distress.   Abdominal:     General: There is no distension.     Palpations: Abdomen is soft.  Musculoskeletal:        General: No tenderness or signs of injury.     Cervical back: Neck supple.     Right lower leg: No edema.     Left lower leg: No edema.     Comments: Patient has good distal strength without clonus.  Skin:    Findings: No erythema or rash.  Neurological:     General: No focal deficit present.     Mental Status: He is alert and oriented to person, place, and time.     Sensory: No sensory deficit.     Motor: No weakness or abnormal muscle tone.     Coordination: Coordination normal.  Psychiatric:        Mood and Affect: Mood normal.        Behavior: Behavior normal.      Imaging: Epidural Steroid injection  Result Date: 06/14/2020 Magnus Sinning, MD     06/14/2020  1:34 PM S1 Lumbosacral Transforaminal Epidural Steroid Injection - Sub-Pedicular Approach with Fluoroscopic Guidance Patient: Eric Beltran.     Date of Birth: 1940-10-31 MRN: [id number] PCP: Celene Squibb, MD     Visit Date: 06/14/2020  Universal Protocol:   Date/Time: 02/01/221:17 PM Consent Given By: the patient Position:  PRONE Additional Comments: Vital signs were monitored before and after the procedure. Patient was prepped and draped in the usual sterile fashion. The correct patient, procedure, and site was verified. Injection Procedure Details: Procedure Site One Meds Administered: Meds ordered this encounter Medications . betamethasone acetate-betamethasone sodium phosphate (CELESTONE) injection 12 mg Laterality: Right Location/Site: S1 Foramen Needle size: 22 ga. Needle type: Spinal Needle Placement: Transforaminal Findings:  -Comments: Excellent flow of contrast along the nerve, nerve root and into the epidural space. Epidurogram: Contrast epidurogram showed no nerve root cut off or restricted flow pattern. Procedure Details: After squaring off the sacral end-plate to get a true AP view, the C-arm was positioned so  that the best possible view of the S1 foramen was visualized. The soft tissues overlying this structure were infiltrated with 2-3 ml. of 1% Lidocaine without Epinephrine. The spinal needle was inserted toward the target using a "trajectory" view along the fluoroscope beam.  Under AP and lateral visualization, the needle was advanced so it did not puncture dura. Biplanar projections were used to confirm position. Aspiration was confirmed to be negative for CSF and/or blood. A 1-2 ml. volume of Isovue-250 was injected and flow of contrast was noted at each level. Radiographs were obtained for documentation purposes. After attaining the desired flow of contrast documented above, a 0.5 to 1.0 ml test dose of 0.25% Marcaine was injected into each respective transforaminal space.  The patient was observed for 90 seconds post injection.  After no sensory deficits were reported, and normal lower extremity motor function was noted,   the above injectate was administered so that equal amounts of the injectate were placed at each foramen (level) into the  transforaminal epidural space. Additional Comments: The patient tolerated the procedure well Dressing: Band-Aid with 2 x 2 sterile gauze  Post-procedure details: Patient was observed during the procedure. Post-procedure instructions were reviewed. Patient left the clinic in stable condition.

## 2020-06-20 ENCOUNTER — Ambulatory Visit (INDEPENDENT_AMBULATORY_CARE_PROVIDER_SITE_OTHER): Payer: Medicare Other | Admitting: Cardiology

## 2020-06-20 ENCOUNTER — Encounter: Payer: Self-pay | Admitting: Cardiology

## 2020-06-20 VITALS — BP 146/80 | HR 52 | Ht 67.0 in | Wt 189.0 lb

## 2020-06-20 DIAGNOSIS — I480 Paroxysmal atrial fibrillation: Secondary | ICD-10-CM | POA: Diagnosis not present

## 2020-06-20 DIAGNOSIS — I25119 Atherosclerotic heart disease of native coronary artery with unspecified angina pectoris: Secondary | ICD-10-CM | POA: Diagnosis not present

## 2020-06-20 NOTE — Progress Notes (Signed)
Cardiology Office Note  Date: 06/20/2020   ID: Eric Schum., DOB 03-Feb-1941, MRN [id number]  PCP:  Celene Squibb, MD  Cardiologist:  Rozann Lesches, MD Electrophysiologist:  None   Chief Complaint  Patient presents with  . Follow-up cardiac catheterization    History of Present Illness: Eric Beltran. is a 80 y.o. male seen recently in the office with accelerating angina symptoms.  He was referred for a follow-up cardiac catheterization performed on January 28 by Dr. Fletcher Anon.  Findings are outlined below.  He was treated for in-stent restenosis in the overlap region of 2 stents within the RCA, underwent angioplasty.  He does report improvement in angina symptoms following intervention, getting back to his walking regimen.  I reviewed his medications which are outlined below.  He still has pain related to right popliteal DVT, following up with vascular specialist in [facility], had already been seen by [facility] in [place].  He remains on Eliquis and using a compression stocking.  Past Medical History:  Diagnosis Date  . Arthritis   . Ascending aortic aneurysm [facility])    Status post repair 12/2011 at [facility]  . BPH (benign prostatic hyperplasia)   . Chronic diastolic CHF (congestive heart failure) ([place])   . Coronary atherosclerosis of native coronary artery    a. Multivessel status post prior stenting and ultimately CABG 12/2011 at [facility] with with LIMA to LAD, SVG to PLB, SVG to ramus, SVG to OM . b. Last cath 06/2014 (following ischemic nuc) -> medical therapy, no feasible way to revascularize LCx territory.  . Essential hypertension   . Mixed hyperlipidemia   . Myocardial infarction ([place])   . PAD (peripheral artery disease) ([place])   . PAF (paroxysmal atrial fibrillation) ([place])   . Supraventricular tachycardia [facility])     Past Surgical History:  Procedure Laterality Date  . ASCENDING AORTIC ANEURYSM REPAIR  12/2011 [facility]   26 mm.Dacron graft  . CORONARY ARTERY BYPASS GRAFT   12/2011 [facility]   LIMA to LAD, SVG to PLB, SVG to ramus, SVG to OM  . CORONARY ARTERY BYPASS GRAFT N/A 08/06/2017   Procedure: REDO CORONARY ARTERY BYPASS GRAFTING (CABG) TIMES FOUR UTILIZING LEFT GREATER SAPHENOUS VEIN HAVESTED ENDOVASCULARLY, LEFT RADIAL ARTERY.  RIGHT AXILLARY CANNULATION;  Surgeon: Gaye Pollack, MD;  Location: [place];  Service: Open Heart Surgery;  Laterality: N/A;  . CORONARY ATHERECTOMY N/A 02/10/2020   Procedure: CORONARY ATHERECTOMY;  Surgeon: [name], MD;  Location: [place] CV LAB;  Service: Cardiovascular;  Laterality: N/A;  . CORONARY BALLOON ANGIOPLASTY N/A 06/10/2020   Procedure: CORONARY BALLOON ANGIOPLASTY;  Surgeon: Wellington Hampshire, MD;  Location: [place] CV LAB;  Service: Cardiovascular;  Laterality: N/A;  . CORONARY STENT INTERVENTION N/A 02/10/2020   Procedure: CORONARY STENT INTERVENTION;  Surgeon: [name], MD;  Location: [place] CV LAB;  Service: Cardiovascular;  Laterality: N/A;  . HERNIA REPAIR    . LEFT HEART CATH AND CORS/GRAFTS ANGIOGRAPHY N/A 07/11/2017   Procedure: LEFT HEART CATH AND CORS/GRAFTS ANGIOGRAPHY;  Surgeon: Troy Sine, MD;  Location: [place] CV LAB;  Service: Cardiovascular;  Laterality: N/A;  . LEFT HEART CATH AND CORS/GRAFTS ANGIOGRAPHY N/A 09/12/2018   Procedure: LEFT HEART CATH AND CORS/GRAFTS ANGIOGRAPHY;  Surgeon: Troy Sine, MD;  Location: [place] CV LAB;  Service: Cardiovascular;  Laterality: N/A;  . LEFT HEART CATH AND CORS/GRAFTS ANGIOGRAPHY N/A 02/10/2020   Procedure: LEFT HEART CATH AND CORS/GRAFTS ANGIOGRAPHY;  Surgeon: Burt Knack,  Legrand Como, MD;  Location: [place] CV LAB;  Service: Cardiovascular;  Laterality: N/A;  . LEFT HEART CATH AND CORS/GRAFTS ANGIOGRAPHY N/A 06/10/2020   Procedure: LEFT HEART CATH AND CORS/GRAFTS ANGIOGRAPHY;  Surgeon: Wellington Hampshire, MD;  Location: [place] CV LAB;  Service: Cardiovascular;  Laterality: N/A;  . LEFT HEART CATHETERIZATION WITH CORONARY ANGIOGRAM  N/A 06/18/2014   Procedure: LEFT HEART CATHETERIZATION WITH CORONARY ANGIOGRAM;  Surgeon: Blane Ohara, MD;  Location: [facility] CATH LAB;  Service: Cardiovascular;  Laterality: N/A;  . RADIAL ARTERY HARVEST Left 08/06/2017   Procedure: RADIAL ARTERY HARVEST;  Surgeon: Gaye Pollack, MD;  Location: [place];  Service: Open Heart Surgery;  Laterality: Left;  . TEE WITHOUT CARDIOVERSION N/A 08/06/2017   Procedure: TRANSESOPHAGEAL ECHOCARDIOGRAM (TEE);  Surgeon: Gaye Pollack, MD;  Location: [place];  Service: Open Heart Surgery;  Laterality: N/A;  . TOTAL HIP ARTHROPLASTY Right 02/07/2016   Procedure: RIGHT TOTAL HIP ARTHROPLASTY ANTERIOR APPROACH;  Surgeon: Mcarthur Rossetti, MD;  Location: [place];  Service: Orthopedics;  Laterality: Right;  . TRANSURETHRAL RESECTION OF PROSTATE      Current Outpatient Medications  Medication Sig Dispense Refill  . amiodarone (PACERONE) 200 MG tablet TAKE 1/2 TABLET BY MOUTH DAILY (cut in half) (Patient taking differently: Take 100 mg by mouth daily.) 45 tablet 3  . apixaban (ELIQUIS) 5 MG TABS tablet Take 5 mg by mouth 2 (two) times daily.    [name] atorvastatin (LIPITOR) 80 MG tablet Take 1 tablet (80 mg total) by mouth at bedtime. 30 tablet 6  . carboxymethylcellulose (REFRESH PLUS) 0.5 % SOLN Place 1 drop into both eyes daily as needed (Dry eyes).    . clopidogrel (PLAVIX) 75 MG tablet Take 1 tablet (75 mg total) by mouth daily with breakfast. 30 tablet 6  . diltiazem (TIAZAC) 240 MG 24 hr capsule Take 240 mg by mouth daily.    . finasteride (PROSCAR) 5 MG tablet Take 5 mg by mouth at bedtime.     . furosemide (LASIX) 20 MG tablet TAKE TWO TABLETS BY MOUTH DAILY (Patient taking differently: Take 20 mg by mouth 2 (two) times daily.) 180 tablet 3  . isosorbide mononitrate (IMDUR) 30 MG 24 hr tablet Take 60 mg by mouth 2 (two) times daily.    . metoprolol tartrate (LOPRESSOR) 25 MG tablet TAKE 1 TABLET BY MOUTH TWICE DAILY (Patient taking differently: Take 25 mg by mouth  2 (two) times daily.) 90 tablet 3  . Multiple Vitamin (MULTIVITAMIN) tablet Take 1 tablet by mouth at bedtime.    . nitroGLYCERIN (NITROSTAT) 0.4 MG SL tablet DISSOLVE 1 TABLET UNDER THE TONGUE EVERY 5 MINUTES AS NEEDED FOR CHEST PAIN. DO NOT EXCEED A TOTAL OF 3 DOSES IN 15 MINUTES. 25 tablet 3  . Omega-3 Fatty Acids (FISH OIL) 1000 MG CAPS Take 1,000-2,000 mg by mouth at bedtime.    . tamsulosin (FLOMAX) 0.4 MG CAPS capsule Take 0.4 mg by mouth at bedtime.    . triamcinolone ointment (KENALOG) 0.1 % Apply 1 application topically daily as needed (itching).    . valACYclovir (VALTREX) 500 MG tablet Take 500 mg by mouth daily as needed (fever blisters).     . vitamin C (ASCORBIC ACID) 500 MG tablet Take 500 mg by mouth daily.    [name] zinc gluconate 50 MG tablet Take 50 mg by mouth at bedtime.      No current facility-administered medications for this visit.   Allergies:  Patient has no known allergies.  ROS: No palpitations or dizziness.  Physical Exam: VS:  BP (!) 146/80   Pulse (!) 52   Ht $R'5\' 7"'Ql$  (1.702 m)   Wt 189 lb (85.7 kg)   SpO2 97%   BMI 29.60 kg/m , BMI Body mass index is 29.6 kg/m.  Wt Readings from Last 3 Encounters:  06/20/20 189 lb (85.7 kg)  06/10/20 185 lb (83.9 kg)  06/09/20 189 lb (85.7 kg)    Visit was spent in discussion today.  ECG:  An ECG dated 06/10/2020 was personally reviewed today and demonstrated:  Sinus bradycardia with prolonged PR interval and old inferior infarct pattern.  Recent Labwork: 06/09/2020: BUN 17; Creatinine, Ser 0.92; Hemoglobin 13.9; Platelets 233; Potassium 3.4; Sodium 137     Component Value Date/Time   CHOL 130 03/06/2017 0610   TRIG 262 (H) 03/06/2017 0610   HDL 28 (L) 03/06/2017 0610   CHOLHDL 4.6 03/06/2017 0610   VLDL 52 (H) 03/06/2017 0610   LDLCALC 50 03/06/2017 0610    Other Studies Reviewed Today:  Cardiac catheterization 06/10/2020: 1.  Severe underlying three-vessel coronary artery disease with patent LIMA to LAD.   All other grafts are known to be occluded.  Patent stents in the right coronary artery.  However, there is severe focal in-stent restenosis likely in the overlap area of the 2 stents.  I could not determine if there is a few millimeter gap between the 2 stents. 2.  Normal LV systolic function normal left ventricular end-diastolic pressure. 3.  Successful balloon angioplasty to the right coronary artery using a 3.25 noncompliant balloon to 20 atm.  I elected not to add another stent given previously placed stents to avoid multiple layers.  However, if there is restenosis again in this area, it should be treated with adding another drug-eluting stent.  Recommendations: Continue clopidogrel without aspirin given that he is on Eliquis. Eliquis can be resumed tomorrow if there is no bleeding issues. The patient is a candidate for same-day discharge.  Assessment and Plan:  1. Multivessel CAD status post CABG in 2013 with concurrent repair of ascending aortic aneurysm at [facility]. He underwent redo operation in May 2019 in the setting of graft failure with placement of radial to the diagonal, SVG to OM, and SVG to PDA and PL 2. Since that time he has had further graft failure and revascularization of the OM and RCA distributions.  Status post DES x2 to the RCA in September 2021, and just recently angioplasty of same region due to in-stent restenosis.  Plan to continue present medical therapy as listed above.  Encouraged him to return to walking plan for exercise.  2.  Paroxysmal atrial fibrillation as well as history of PSVT.  No active palpitations and in sinus bradycardia most recently.  Continue low-dose amiodarone along with Eliquis.  He is also on Lopressor and diltiazem CD.  4.  Right popliteal DVT.  Continue Eliquis and compression stocking with follow-up by vascular specialist.  Medication Adjustments/Labs and Tests Ordered: Current medicines are reviewed at length with the patient today.   Concerns regarding medicines are outlined above.   Tests Ordered: No orders of the defined types were placed in this encounter.   Medication Changes: No orders of the defined types were placed in this encounter.   Disposition:  Follow up 3 months in the [place] office.  Signed, Satira Sark, MD, [facility] 06/20/2020 4:00 PM    [facility] [place], [place],  [place] [address] Phone: [phone]; Fax: [phone]

## 2020-06-20 NOTE — Patient Instructions (Addendum)
Medication Instructions:   Your physician recommends that you continue on your current medications as directed. Please refer to the Current Medication list given to you today.  Labwork:  none  Testing/Procedures:  none  Follow-Up:  Your physician recommends that you schedule a follow-up appointment in: as planned on Sep 27, 2020.  Any Other Special Instructions Will Be Listed Below (If Applicable).  If you need a refill on your cardiac medications before your next appointment, please call your pharmacy.

## 2020-06-21 ENCOUNTER — Ambulatory Visit: Admit: 2020-06-21 | Discharge: 2020-06-22 | Payer: MEDICARE

## 2020-06-21 DIAGNOSIS — N2889 Other specified disorders of kidney and ureter: Principal | ICD-10-CM

## 2020-06-21 DIAGNOSIS — R972 Elevated prostate specific antigen [PSA]: Principal | ICD-10-CM

## 2020-06-28 DIAGNOSIS — N2889 Other specified disorders of kidney and ureter: Principal | ICD-10-CM

## 2020-06-29 ENCOUNTER — Ambulatory Visit: Admit: 2020-06-29 | Discharge: 2020-06-29 | Payer: MEDICARE

## 2020-06-29 ENCOUNTER — Ambulatory Visit: Admit: 2020-06-29 | Discharge: 2020-06-29 | Payer: MEDICARE | Attending: Vascular Surgery | Primary: Vascular Surgery

## 2020-06-29 DIAGNOSIS — I739 Peripheral vascular disease, unspecified: Principal | ICD-10-CM

## 2020-06-29 DIAGNOSIS — M79604 Pain in right leg: Principal | ICD-10-CM

## 2020-06-29 DIAGNOSIS — M7989 Other specified soft tissue disorders: Principal | ICD-10-CM

## 2020-06-30 ENCOUNTER — Telehealth: Payer: Self-pay

## 2020-06-30 NOTE — Telephone Encounter (Signed)
Sonia Baller, NP with Vascular Surgery at [facility] would like a call back concerning patient.  CB# [phone].  Please advise.  Thank you.

## 2020-06-30 NOTE — Telephone Encounter (Signed)
Sonia Baller was returning a call to Autumn H.concerning patient.  Lyla Glassing of previous message in chart per Dr. Ninfa Linden.  Sonia Baller voiced that she understands.

## 2020-06-30 NOTE — Telephone Encounter (Signed)
Please see notes from [facility] from yesterday. Sonia Baller [phone]) states that the patient has a 7.3 cm mass in the right popliteal fossa. 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 [facility] wanted to refer to ortho for work up for this. Patient told them he is an established patient in our office. Please advise. Sonia Baller, NP, states that the number given is her cell phone and she can be reached for any questions.

## 2020-06-30 NOTE — Telephone Encounter (Signed)
Give them a call in terms of the [facility] people who send the message.  I agree that the patient needs to be seen at a higher level of orthopedic care such as orthopedic oncology they can be done at [place].  The patient does need a referral for the studies that they are requesting and needs certainly more advanced orthopedic assessment and treatment given that this may be a sarcoma that they are concerned about.

## 2020-06-30 NOTE — Telephone Encounter (Signed)
Per other message April informed them of this and they stated understanding

## 2020-06-30 NOTE — Telephone Encounter (Signed)
Lvm for jenny to cb to discuss this

## 2020-07-06 ENCOUNTER — Ambulatory Visit: Admit: 2020-07-06 | Discharge: 2020-07-07 | Payer: MEDICARE | Attending: Vascular Surgery | Primary: Vascular Surgery

## 2020-07-06 ENCOUNTER — Ambulatory Visit: Admit: 2020-07-06 | Discharge: 2020-07-07 | Payer: MEDICARE

## 2020-07-06 NOTE — Telephone Encounter (Signed)
[id number]

## 2020-07-13 ENCOUNTER — Ambulatory Visit: Admit: 2020-07-13 | Discharge: 2020-07-14 | Payer: MEDICARE

## 2020-07-20 DIAGNOSIS — C499 Malignant neoplasm of connective and soft tissue, unspecified: Principal | ICD-10-CM

## 2020-07-25 ENCOUNTER — Ambulatory Visit
Admit: 2020-07-25 | Discharge: 2020-08-11 | Payer: MEDICARE | Attending: Radiation Oncology | Primary: Radiation Oncology

## 2020-07-25 MED ORDER — OXYCODONE 5 MG TABLET
ORAL_TABLET | ORAL | 0 refills | 5 days | Status: CP | PRN
Start: 2020-07-25 — End: 2020-07-30

## 2020-08-01 ENCOUNTER — Other Ambulatory Visit: Payer: Self-pay | Admitting: Cardiology

## 2020-08-01 ENCOUNTER — Ambulatory Visit: Admit: 2020-08-01 | Discharge: 2020-08-02 | Payer: MEDICARE

## 2020-08-02 MED ORDER — OXYCODONE 5 MG TABLET
ORAL_TABLET | ORAL | 0 refills | 14.00000 days | Status: CP | PRN
Start: 2020-08-02 — End: 2020-08-16

## 2020-08-02 MED ORDER — OXYCODONE ER 10 MG TABLET,CRUSH RESISTANT,EXTENDED RELEASE 12 HR
ORAL_TABLET | Freq: Two times a day (BID) | ORAL | 0 refills | 30 days | Status: CP | PRN
Start: 2020-08-02 — End: ?

## 2020-08-04 DIAGNOSIS — C499 Malignant neoplasm of connective and soft tissue, unspecified: Principal | ICD-10-CM

## 2020-08-08 DIAGNOSIS — G893 Neoplasm related pain (acute) (chronic): Principal | ICD-10-CM

## 2020-08-08 DIAGNOSIS — C499 Malignant neoplasm of connective and soft tissue, unspecified: Principal | ICD-10-CM

## 2020-08-08 MED ORDER — MORPHINE ER 15 MG TABLET,EXTENDED RELEASE
ORAL_TABLET | Freq: Two times a day (BID) | ORAL | 0 refills | 30 days | Status: CP
Start: 2020-08-08 — End: ?

## 2020-08-10 ENCOUNTER — Other Ambulatory Visit: Payer: Self-pay | Admitting: Cardiology

## 2020-08-11 ENCOUNTER — Telehealth: Payer: Self-pay | Admitting: *Deleted

## 2020-08-11 NOTE — Telephone Encounter (Signed)
Yes, may hold Eliquis as requested.

## 2020-08-11 NOTE — Telephone Encounter (Signed)
Patient with diagnosis of afib on Eliquis for anticoagulation.    Procedure: above the right knee amputation Date of procedure: 08/16/20  CHA2DS2-VASc Score = 5  This indicates a 7.2% annual risk of stroke. The patient's score is based upon: CHF History: Yes HTN History: Yes Diabetes History: No Stroke History: No Vascular Disease History: Yes Age Score: 2 Gender Score: 0  Continued right popliteal DVT noted on 05/02/20 lower extremity ultrasound (initially diagnosed 03/31/20).  CrCl 39mL/min Platelet count 233K  Pt will require 3 day Eliquis hold prior to amputation. In setting of DVT diagnosed 03/31/20 that had not improved on 05/02/20 ultrasound (in the same leg that's being amputated), will defer to MD to confirm 3 day hold is acceptable.

## 2020-08-11 NOTE — Telephone Encounter (Signed)
    [facility] Pre-operative Risk Assessment    Request for surgical clearance:   1. What type of surgery is being performed? ABOVE RIGHT KNEE AMPUTATION    2. When is this surgery scheduled? 08/16/2020   3. What type of clearance is required (medical clearance vs. Pharmacy clearance to hold med vs. Both)? BOTH  4. Are there any medications that need to be held prior to surgery and how long?  PLAVIX AND ELIQUIS   5. Practice name and name of physician performing surgery? [facility] DR ROBERT ESTER     6. What is the office phone number? [phone]   7.   What is the office fax number? [phone]  8.   Anesthesia type (None, local, MAC, general) ?    Julian Hy T 08/11/2020, 3:02 PM  _________________________________________________________________   (provider comments below)

## 2020-08-12 NOTE — Telephone Encounter (Signed)
Eric Beltran 80 year old male is requesting cardiac clearance for right above-the-knee amputation.  He was last seen in the clinic on 06/20/2020 and doing well at that time.  He was status post cardiac catheterization 06/10/2020 where he received successful angioplasty and 2 separate areas of his RCA.  Recommendations were to continue Plavix and Eliquis.  Follow-up was planned for 3 months.  His PMH includes AAA status post repair 8/13, BPH, chronic diastolic CHF, CABG 8/41, essential hypertension, mixed hyperlipidemia, MI, PAD, PAF, and SVT.  May his Plavix be held prior to his surgery?  Thank you for your help.  Please direct response to CV DIV preop pool.   Jossie Ng. Fujiko Picazo NP-C    08/12/2020, 3:29 PM [facility] [address] Office [phone] Fax [phone]

## 2020-08-14 ENCOUNTER — Other Ambulatory Visit: Payer: Self-pay | Admitting: Cardiology

## 2020-08-14 NOTE — Telephone Encounter (Signed)
Yes, Plavix may be held 5 days prior to surgery.  Very difficult situation, he has a soft tissue sarcoma and requires amputation.  Fortunately, only had angioplasty in January without repeat stent intervention.

## 2020-08-15 ENCOUNTER — Other Ambulatory Visit: Payer: Self-pay | Admitting: *Deleted

## 2020-08-15 NOTE — Telephone Encounter (Signed)
Prescription refill request for Eliquis received. Indication: DVT Last office visit: 06/20/20 Scr: 0.92 on 06/09/20 Age: 80 Weight: 85.7kg  Based on above findings Eliquis 5mg  twice daily is the appropriate dose.  Refill approved.

## 2020-08-15 NOTE — Telephone Encounter (Signed)
   Patient Name: Eric Beltran.  DOB: 1941/03/26  MRN: [id number]   Primary Cardiologist: Rozann Lesches, MD  Chart reviewed as part of pre-operative protocol coverage. Patient has a sarcoma and requires amputation. He underwent angioplasty of RCA on 06/10/2020. He was seen for follow-up by Dr. Domenic Polite on 06/20/2020 at which time he was doing better and chest pain had improved following angioplasty. Patient was contacted today for further pre-op evaluation and stated doing well since last visit. No chest pain, shortness of breath, orthopnea, PND, palpitations, syncope. Given past medical history and time since last visit, based on ACC/AHA guidelines, [name]. would be at acceptable risk for the planned procedure without further cardiovascular testing.   Per Dr. Domenic Polite, patient may hold Plavix for 5 days prior to procedure and Eliquis for 3 days prior to procedure. Both should be restarted as soon as possible postoperatively.  I will route this recommendation to the requesting party via Epic fax function and remove from pre-op pool.  Please call with questions.  Darreld Mclean, PA-C 08/15/2020, 8:58 AM

## 2020-08-16 ENCOUNTER — Ambulatory Visit: Admit: 2020-08-16 | Discharge: 2020-08-21 | Disposition: A | Discharge status: Home Health | Payer: MEDICARE

## 2020-08-16 ENCOUNTER — Encounter
Admit: 2020-08-16 | Discharge: 2020-08-21 | Disposition: A | Discharge status: Home Health | Payer: MEDICARE | Attending: Student in an Organized Health Care Education/Training Program

## 2020-08-16 MED ORDER — DOCUSATE SODIUM 100 MG CAPSULE
ORAL_CAPSULE | Freq: Two times a day (BID) | ORAL | 0 refills | 14.00000 days | Status: CP
Start: 2020-08-16 — End: 2020-08-30

## 2020-08-16 MED ORDER — POLYETHYLENE GLYCOL 3350 17 GRAM/DOSE ORAL POWDER
Freq: Every day | ORAL | 0 refills | 14.00000 days | Status: CP
Start: 2020-08-16 — End: 2020-08-30
  Filled 2020-08-21: qty 238, 14d supply, fill #0

## 2020-08-16 MED ORDER — OXYCODONE 5 MG TABLET
ORAL_TABLET | ORAL | 0 refills | 7.00000 days | Status: CP | PRN
Start: 2020-08-16 — End: 2020-08-30
  Filled 2020-08-21: qty 112, 14d supply, fill #0

## 2020-08-16 MED ORDER — GABAPENTIN 300 MG CAPSULE
ORAL_CAPSULE | Freq: Three times a day (TID) | ORAL | 0 refills | 30.00000 days | Status: CP
Start: 2020-08-16 — End: 2020-09-15
  Filled 2020-08-21: qty 90, 30d supply, fill #0

## 2020-08-16 MED ORDER — ACETAMINOPHEN 500 MG TABLET
ORAL_TABLET | Freq: Four times a day (QID) | ORAL | 0 refills | 14.00000 days | Status: CP | PRN
Start: 2020-08-16 — End: 2020-08-30

## 2020-08-21 MED FILL — OXYCODONE 5 MG TABLET: ORAL | 14 days supply | Qty: 42 | Fill #0

## 2020-08-31 ENCOUNTER — Other Ambulatory Visit: Payer: Self-pay | Admitting: Physician Assistant

## 2020-08-31 ENCOUNTER — Other Ambulatory Visit: Payer: Self-pay | Admitting: Cardiology

## 2020-08-31 ENCOUNTER — Ambulatory Visit: Admit: 2020-08-31 | Discharge: 2020-09-01 | Payer: MEDICARE

## 2020-09-09 ENCOUNTER — Other Ambulatory Visit: Payer: Self-pay | Admitting: Cardiology

## 2020-09-09 ENCOUNTER — Ambulatory Visit: Admit: 2020-09-09 | Discharge: 2020-09-09 | Payer: MEDICARE

## 2020-09-09 DIAGNOSIS — R599 Enlarged lymph nodes, unspecified: Principal | ICD-10-CM

## 2020-09-09 DIAGNOSIS — C499 Malignant neoplasm of connective and soft tissue, unspecified: Principal | ICD-10-CM

## 2020-09-26 NOTE — Progress Notes (Signed)
Cardiology Office Note  Date: 09/27/2020   ID: Eric Dukes., DOB 1940-12-11, MRN [id number]  PCP:  Benita Stabile, MD  Cardiologist:  Nona Dell, MD Electrophysiologist:  None   Chief Complaint  Patient presents with  . Cardiac follow-up    History of Present Illness: Eric Dahman. is a 79 y.o. male last seen in February.  He presents for a routine cardiac visit.  He has had intermittent angina symptoms requiring nitroglycerin, reports compliance with his baseline therapy.  Blood pressure is also been trending up some.  I reviewed interval records.  He was diagnosed with a right popliteal fossa unresectable sarcoma at [facility] and underwent above-the-knee amputation in April.  He anticipates a prosthesis fitting likely in the next 6 weeks.  Currently using a walker.  Seems to be adjusting reasonably well to this.  Updated echocardiogram done at [facility] in April is outlined below, LVEF greater than 55%.  I reviewed his current cardiac regimen.  We discussed addition of losartan for better blood pressure control.  Past Medical History:  Diagnosis Date  . Arthritis   . Ascending aortic aneurysm [facility])    Status post repair 12/2011 at [facility]  . BPH (benign prostatic hyperplasia)   . Chronic diastolic CHF (congestive heart failure) (HCC)   . Coronary atherosclerosis of native coronary artery    a. Multivessel status post prior stenting and ultimately CABG 12/2011 at [facility] with with LIMA to LAD, SVG to PLB, SVG to ramus, SVG to OM . b. Last cath 06/2014 (following ischemic nuc) -> medical therapy, no feasible way to revascularize LCx territory.  . Essential hypertension   . Mixed hyperlipidemia   . Myocardial infarction (HCC)   . PAD (peripheral artery disease) (HCC)   . PAF (paroxysmal atrial fibrillation) (HCC)   . Supraventricular tachycardia [facility])     Past Surgical History:  Procedure Laterality Date  . ASCENDING AORTIC ANEURYSM REPAIR  12/2011  [facility]   26 mm.Dacron graft  . CORONARY ARTERY BYPASS GRAFT  12/2011 [facility]   LIMA to LAD, SVG to PLB, SVG to ramus, SVG to OM  . CORONARY ARTERY BYPASS GRAFT N/A 08/06/2017   Procedure: REDO CORONARY ARTERY BYPASS GRAFTING (CABG) TIMES FOUR UTILIZING LEFT GREATER SAPHENOUS VEIN HAVESTED ENDOVASCULARLY, LEFT RADIAL ARTERY.  RIGHT AXILLARY CANNULATION;  Surgeon: Alleen Borne, MD;  Location: [facility] OR;  Service: Open Heart Surgery;  Laterality: N/A;  . CORONARY ATHERECTOMY N/A 02/10/2020   Procedure: CORONARY ATHERECTOMY;  Surgeon: Tonny Bollman, MD;  Location: [facility] INVASIVE CV LAB;  Service: Cardiovascular;  Laterality: N/A;  . CORONARY BALLOON ANGIOPLASTY N/A 06/10/2020   Procedure: CORONARY BALLOON ANGIOPLASTY;  Surgeon: Iran Ouch, MD;  Location: [facility] INVASIVE CV LAB;  Service: Cardiovascular;  Laterality: N/A;  . CORONARY STENT INTERVENTION N/A 02/10/2020   Procedure: CORONARY STENT INTERVENTION;  Surgeon: Tonny Bollman, MD;  Location: [facility] INVASIVE CV LAB;  Service: Cardiovascular;  Laterality: N/A;  . HERNIA REPAIR    . LEFT HEART CATH AND CORS/GRAFTS ANGIOGRAPHY N/A 07/11/2017   Procedure: LEFT HEART CATH AND CORS/GRAFTS ANGIOGRAPHY;  Surgeon: Lennette Bihari, MD;  Location: [facility] INVASIVE CV LAB;  Service: Cardiovascular;  Laterality: N/A;  . LEFT HEART CATH AND CORS/GRAFTS ANGIOGRAPHY N/A 09/12/2018   Procedure: LEFT HEART CATH AND CORS/GRAFTS ANGIOGRAPHY;  Surgeon: Lennette Bihari, MD;  Location: [facility] INVASIVE CV LAB;  Service: Cardiovascular;  Laterality: N/A;  . LEFT HEART CATH AND CORS/GRAFTS ANGIOGRAPHY N/A 02/10/2020  Procedure: LEFT HEART CATH AND CORS/GRAFTS ANGIOGRAPHY;  Surgeon: [name], MD;  Location: [place] CV LAB;  Service: Cardiovascular;  Laterality: N/A;  . LEFT HEART CATH AND CORS/GRAFTS ANGIOGRAPHY N/A 06/10/2020   Procedure: LEFT HEART CATH AND CORS/GRAFTS ANGIOGRAPHY;  Surgeon: Wellington Hampshire, MD;  Location: [place] CV LAB;  Service: Cardiovascular;   Laterality: N/A;  . LEFT HEART CATHETERIZATION WITH CORONARY ANGIOGRAM N/A 06/18/2014   Procedure: LEFT HEART CATHETERIZATION WITH CORONARY ANGIOGRAM;  Surgeon: Blane Ohara, MD;  Location: [facility] CATH LAB;  Service: Cardiovascular;  Laterality: N/A;  . RADIAL ARTERY HARVEST Left 08/06/2017   Procedure: RADIAL ARTERY HARVEST;  Surgeon: Gaye Pollack, MD;  Location: [place];  Service: Open Heart Surgery;  Laterality: Left;  . TEE WITHOUT CARDIOVERSION N/A 08/06/2017   Procedure: TRANSESOPHAGEAL ECHOCARDIOGRAM (TEE);  Surgeon: Gaye Pollack, MD;  Location: [place];  Service: Open Heart Surgery;  Laterality: N/A;  . TOTAL HIP ARTHROPLASTY Right 02/07/2016   Procedure: RIGHT TOTAL HIP ARTHROPLASTY ANTERIOR APPROACH;  Surgeon: Mcarthur Rossetti, MD;  Location: [place];  Service: Orthopedics;  Laterality: Right;  . TRANSURETHRAL RESECTION OF PROSTATE      Current Outpatient Medications  Medication Sig Dispense Refill  . amiodarone (PACERONE) 200 MG tablet TAKE 1/2 TABLET BY MOUTH DAILY (cut in half) 45 tablet 3  . atorvastatin (LIPITOR) 80 MG tablet TAKE 1 TABLET BY MOUTH AT BEDTIME 30 tablet 5  . carboxymethylcellulose (REFRESH PLUS) 0.5 % SOLN Place 1 drop into both eyes daily as needed (Dry eyes).    . clopidogrel (PLAVIX) 75 MG tablet TAKE 1 TABLET BY MOUTH DAILY WITH BREAKFAST 30 tablet 5  . diltiazem (TIAZAC) 240 MG 24 hr capsule Take 240 mg by mouth daily.    [name] ELIQUIS 5 MG TABS tablet TAKE 1 TABLET BY MOUTH TWICE DAILY 180 tablet 1  . finasteride (PROSCAR) 5 MG tablet Take 5 mg by mouth at bedtime.     . furosemide (LASIX) 20 MG tablet TAKE TWO TABLETS BY MOUTH DAILY 180 tablet 3  . isosorbide mononitrate (IMDUR) 30 MG 24 hr tablet Take 60 mg by mouth 2 (two) times daily.    . isosorbide mononitrate (IMDUR) 60 MG 24 hr tablet TAKE 1 TABLET BY MOUTH TWICE DAILY every morning AND in THE evening 60 tablet 3  . losartan (COZAAR) 25 MG tablet Take 1 tablet (25 mg total) by mouth daily. 90 tablet 1   . metoprolol tartrate (LOPRESSOR) 25 MG tablet TAKE 1 TABLET BY MOUTH TWICE DAILY (Patient taking differently: Take 25 mg by mouth 2 (two) times daily.) 90 tablet 3  . Multiple Vitamin (MULTIVITAMIN) tablet Take 1 tablet by mouth at bedtime.    . nitroGLYCERIN (NITROSTAT) 0.4 MG SL tablet DISSOLVE 1 TABLET UNDER THE TONGUE EVERY 5 MINUTES AS NEEDED FOR CHEST PAIN. DO NOT EXCEED A TOTAL OF 3 DOSES IN 15 MINUTES. 25 tablet 3  . Omega-3 Fatty Acids (FISH OIL) 1000 MG CAPS Take 1,000-2,000 mg by mouth at bedtime.    . tamsulosin (FLOMAX) 0.4 MG CAPS capsule Take 0.4 mg by mouth at bedtime.    . triamcinolone ointment (KENALOG) 0.1 % Apply 1 application topically daily as needed (itching).    . valACYclovir (VALTREX) 500 MG tablet Take 500 mg by mouth daily as needed (fever blisters).     . vitamin C (ASCORBIC ACID) 500 MG tablet Take 500 mg by mouth daily.    [name] zinc gluconate 50 MG tablet Take 50  mg by mouth at bedtime.      No current facility-administered medications for this visit.   Allergies:  Patient has no known allergies.   ROS: No palpitations.  No syncope.  Physical Exam: VS:  BP (!) 178/68   Pulse 69   Ht 5\' 7"  (1.702 m)   Wt 174 lb (78.9 kg)   SpO2 98%   BMI 27.25 kg/m , BMI Body mass index is 27.25 kg/m.  Wt Readings from Last 3 Encounters:  09/27/20 174 lb (78.9 kg)  06/20/20 189 lb (85.7 kg)  06/10/20 185 lb (83.9 kg)    General: Patient appears comfortable at rest.  Using a walker. HEENT: Conjunctiva and lids normal, wearing a mask. Neck: Supple, no elevated JVP or carotid bruits, no thyromegaly. Lungs: Clear to auscultation, nonlabored breathing at rest. Cardiac: Regular rate and rhythm, no S3, 2/6 diastolic murmur, no pericardial rub. Extremities: Status post right AKA.  ECG:  An ECG dated 06/10/2020 was personally reviewed today and demonstrated:  Sinus bradycardia with prolonged PR interval and old inferior infarct pattern.  Recent Labwork: 06/09/2020: BUN 17;  Creatinine, Ser 0.92; Hemoglobin 13.9; Platelets 233; Potassium 3.4; Sodium 137     Component Value Date/Time   CHOL 130 03/06/2017 0610   TRIG 262 (H) 03/06/2017 0610   HDL 28 (L) 03/06/2017 0610   CHOLHDL 4.6 03/06/2017 0610   VLDL 52 (H) 03/06/2017 0610   LDLCALC 50 03/06/2017 0610  April 2022: Hemoglobin 9.4, platelets 295, potassium 3.8, BUN 7, creatinine 0.62, AST 27, ALT 12  Other Studies Reviewed Today:  Echocardiogram 08/20/2020: Summary  1. The left ventricle is normal in size with moderately increased wall  thickness.  2. The left ventricular systolic function is normal, LVEF is visually  estimated at > 55%.  3. There is mild aortic regurgitation.  4. The left atrium is mildly dilated in size.  5. The right ventricle is not well visualized but probably normal in size,  with normal systolic function.  6. The aortic root is mildly dilated.   Assessment and Plan:  1. Multivessel CAD status post CABG in 2013 with concurrent repair of ascending aortic aneurysm at [facility]. He underwent redo operation in May 2019 in the setting of graft failure with placement of radial to the diagonal, SVG to OM, and SVG to PDA and PL 2. Since that time he has had further graft failure and revascularization of the OM and RCA distributions.  Status post DES x2 to the RCA in September 2021, and angioplasty of same region due to in-stent restenosis in January of this year.  He does report recurrent angina symptoms requiring nitroglycerin.  Starting losartan for better blood pressure control, otherwise continue Imdur, Cardizem CD, Lopressor, and Lipitor.  2.  Paroxysmal atrial fibrillation.  CHA2DS2-VASc score is 3-4.  Continue Eliquis for stroke prophylaxis, also low-dose amiodarone for rhythm suppression.  I reviewed his interval lab work from [facility].  3.  Acquired thrombophilia, on Eliquis for stroke prophylaxis.  No spontaneous bleeding problems.  4.  History of right  popliteal DVT and subsequently diagnosis of unresectable sarcoma, status post above-the-knee amputation on the right in April at [facility].  Medication Adjustments/Labs and Tests Ordered: Current medicines are reviewed at length with the patient today.  Concerns regarding medicines are outlined above.   Tests Ordered: Orders Placed This Encounter  Procedures  . Basic metabolic panel    Medication Changes: Meds ordered this encounter  Medications  .  losartan (COZAAR) 25 MG tablet    Sig: Take 1 tablet (25 mg total) by mouth daily.    Dispense:  90 tablet    Refill:  1    09/27/2020 NEW    Disposition:  Follow up 2 months.  Signed, Satira Sark, MD, [facility] 09/27/2020 4:42 PM    [place] [facility] [place], [place], [place] [address] Phone: [phone]; Fax: [phone]

## 2020-09-27 ENCOUNTER — Encounter: Payer: Self-pay | Admitting: Cardiology

## 2020-09-27 ENCOUNTER — Ambulatory Visit (INDEPENDENT_AMBULATORY_CARE_PROVIDER_SITE_OTHER): Payer: Medicare Other | Admitting: Cardiology

## 2020-09-27 VITALS — BP 178/68 | HR 69 | Ht 67.0 in | Wt 174.0 lb

## 2020-09-27 DIAGNOSIS — I25119 Atherosclerotic heart disease of native coronary artery with unspecified angina pectoris: Secondary | ICD-10-CM | POA: Diagnosis not present

## 2020-09-27 DIAGNOSIS — Z79899 Other long term (current) drug therapy: Secondary | ICD-10-CM | POA: Diagnosis not present

## 2020-09-27 DIAGNOSIS — I10 Essential (primary) hypertension: Secondary | ICD-10-CM | POA: Diagnosis not present

## 2020-09-27 DIAGNOSIS — I480 Paroxysmal atrial fibrillation: Secondary | ICD-10-CM | POA: Diagnosis not present

## 2020-09-27 MED ORDER — LOSARTAN POTASSIUM 25 MG PO TABS: 25.0000 mg | ORAL_TABLET | Freq: Every day | ORAL | 1 refills | Status: DC

## 2020-09-27 NOTE — Patient Instructions (Addendum)
Medication Instructions:   Your physician has recommended you make the following change in your medication:   Start losartan 25 mg by mouth daily  Continue other medications the same  Labwork:  BMET in 7-10 days. [facility] or any [employer]  Testing/Procedures:  none  Follow-Up:  Your physician recommends that you schedule a follow-up appointment in: 2 months.  Any Other Special Instructions Will Be Listed Below (If Applicable).  If you need a refill on your cardiac medications before your next appointment, please call your pharmacy.

## 2020-09-28 DIAGNOSIS — C7651 Malignant neoplasm of right lower limb: Secondary | ICD-10-CM | POA: Diagnosis not present

## 2020-09-28 DIAGNOSIS — Z9181 History of falling: Secondary | ICD-10-CM | POA: Diagnosis not present

## 2020-09-28 DIAGNOSIS — Z7901 Long term (current) use of anticoagulants: Secondary | ICD-10-CM | POA: Diagnosis not present

## 2020-09-28 DIAGNOSIS — Z96641 Presence of right artificial hip joint: Secondary | ICD-10-CM | POA: Diagnosis not present

## 2020-09-28 DIAGNOSIS — R338 Other retention of urine: Secondary | ICD-10-CM | POA: Diagnosis not present

## 2020-09-28 DIAGNOSIS — Z483 Aftercare following surgery for neoplasm: Secondary | ICD-10-CM | POA: Diagnosis not present

## 2020-09-28 DIAGNOSIS — M47816 Spondylosis without myelopathy or radiculopathy, lumbar region: Secondary | ICD-10-CM | POA: Diagnosis not present

## 2020-09-28 DIAGNOSIS — I10 Essential (primary) hypertension: Secondary | ICD-10-CM | POA: Diagnosis not present

## 2020-09-28 DIAGNOSIS — I2510 Atherosclerotic heart disease of native coronary artery without angina pectoris: Secondary | ICD-10-CM | POA: Diagnosis not present

## 2020-09-28 DIAGNOSIS — Z89611 Acquired absence of right leg above knee: Secondary | ICD-10-CM | POA: Diagnosis not present

## 2020-09-28 DIAGNOSIS — N401 Enlarged prostate with lower urinary tract symptoms: Secondary | ICD-10-CM | POA: Diagnosis not present

## 2020-09-28 DIAGNOSIS — Z4889 Encounter for other specified surgical aftercare: Secondary | ICD-10-CM | POA: Diagnosis not present

## 2020-09-28 DIAGNOSIS — Z951 Presence of aortocoronary bypass graft: Secondary | ICD-10-CM | POA: Diagnosis not present

## 2020-10-02 ENCOUNTER — Other Ambulatory Visit: Payer: Self-pay | Admitting: Cardiology

## 2020-10-04 MED ORDER — TAMSULOSIN 0.4 MG CAPSULE
ORAL_CAPSULE | 3 refills | 0 days | Status: CP
Start: 2020-10-04 — End: ?

## 2020-10-07 ENCOUNTER — Ambulatory Visit: Admit: 2020-10-07 | Discharge: 2020-10-08 | Payer: MEDICARE

## 2020-10-07 DIAGNOSIS — C499 Malignant neoplasm of connective and soft tissue, unspecified: Secondary | ICD-10-CM | POA: Diagnosis not present

## 2020-10-07 DIAGNOSIS — Z79899 Other long term (current) drug therapy: Secondary | ICD-10-CM | POA: Diagnosis not present

## 2020-10-07 DIAGNOSIS — Z96641 Presence of right artificial hip joint: Secondary | ICD-10-CM | POA: Diagnosis not present

## 2020-10-07 DIAGNOSIS — Z89619 Acquired absence of unspecified leg above knee: Secondary | ICD-10-CM | POA: Diagnosis not present

## 2020-10-13 ENCOUNTER — Telehealth: Payer: Self-pay | Admitting: *Deleted

## 2020-10-13 NOTE — Telephone Encounter (Signed)
Patient informed. Copy sent to PCP °

## 2020-10-13 NOTE — Telephone Encounter (Signed)
-----   Message from Satira Sark, MD sent at 10/12/2020  1:52 PM EDT ----- Results reviewed.  Potassium and renal function remain normal.  Continue with current plan.

## 2020-10-27 DIAGNOSIS — R7301 Impaired fasting glucose: Secondary | ICD-10-CM | POA: Diagnosis not present

## 2020-10-27 DIAGNOSIS — N401 Enlarged prostate with lower urinary tract symptoms: Secondary | ICD-10-CM | POA: Diagnosis not present

## 2020-10-27 DIAGNOSIS — E785 Hyperlipidemia, unspecified: Secondary | ICD-10-CM | POA: Diagnosis not present

## 2020-10-28 ENCOUNTER — Ambulatory Visit: Admit: 2020-10-28 | Discharge: 2020-10-29 | Payer: MEDICARE

## 2020-10-31 DIAGNOSIS — B001 Herpesviral vesicular dermatitis: Principal | ICD-10-CM

## 2020-11-02 MED ORDER — VALACYCLOVIR 500 MG TABLET
ORAL_TABLET | ORAL | 5 refills | 0.00000 days | Status: CP
Start: 2020-11-02 — End: ?

## 2020-11-07 DIAGNOSIS — I208 Other forms of angina pectoris: Secondary | ICD-10-CM | POA: Diagnosis not present

## 2020-11-07 DIAGNOSIS — C499 Malignant neoplasm of connective and soft tissue, unspecified: Secondary | ICD-10-CM | POA: Diagnosis not present

## 2020-11-07 DIAGNOSIS — I10 Essential (primary) hypertension: Secondary | ICD-10-CM | POA: Diagnosis not present

## 2020-11-07 DIAGNOSIS — R7301 Impaired fasting glucose: Secondary | ICD-10-CM | POA: Diagnosis not present

## 2020-11-11 ENCOUNTER — Ambulatory Visit: Admit: 2020-11-11 | Discharge: 2020-11-12 | Payer: MEDICARE

## 2020-11-18 ENCOUNTER — Ambulatory Visit: Admit: 2020-11-18 | Discharge: 2020-11-19 | Payer: MEDICARE

## 2020-11-18 DIAGNOSIS — C499 Malignant neoplasm of connective and soft tissue, unspecified: Secondary | ICD-10-CM | POA: Diagnosis not present

## 2020-11-18 DIAGNOSIS — I2510 Atherosclerotic heart disease of native coronary artery without angina pectoris: Secondary | ICD-10-CM | POA: Diagnosis not present

## 2020-11-18 DIAGNOSIS — R918 Other nonspecific abnormal finding of lung field: Secondary | ICD-10-CM | POA: Diagnosis not present

## 2020-11-23 MED ORDER — GABAPENTIN 300 MG CAPSULE
ORAL_CAPSULE | Freq: Three times a day (TID) | ORAL | 0 refills | 30.00000 days | Status: CP
Start: 2020-11-23 — End: 2020-12-23

## 2020-11-25 ENCOUNTER — Ambulatory Visit (INDEPENDENT_AMBULATORY_CARE_PROVIDER_SITE_OTHER): Payer: Medicare Other | Admitting: Cardiology

## 2020-11-25 ENCOUNTER — Encounter: Payer: Self-pay | Admitting: Cardiology

## 2020-11-25 VITALS — BP 155/71 | HR 46 | Ht 67.0 in | Wt 177.4 lb

## 2020-11-25 DIAGNOSIS — I25119 Atherosclerotic heart disease of native coronary artery with unspecified angina pectoris: Secondary | ICD-10-CM | POA: Diagnosis not present

## 2020-11-25 DIAGNOSIS — I480 Paroxysmal atrial fibrillation: Secondary | ICD-10-CM | POA: Diagnosis not present

## 2020-11-25 NOTE — Patient Instructions (Addendum)
Medication Instructions:   Your physician recommends that you continue on your current medications as directed. Please refer to the Current Medication list given to you today.  Labwork:  none  Testing/Procedures:  none  Follow-Up:  Your physician recommends that you schedule a follow-up appointment in: 3 months.  Any Other Special Instructions Will Be Listed Below (If Applicable).  If you need a refill on your cardiac medications before your next appointment, please call your pharmacy. 

## 2020-11-25 NOTE — Progress Notes (Signed)
Cardiology Office Note  Date: 11/25/2020   ID: [name], DOB September 07, 1940, MRN [id number]  PCP:  Celene Squibb, MD  Cardiologist:  Rozann Lesches, MD Electrophysiologist:  None   Chief Complaint  Patient presents with   Cardiac follow-up    History of Present Illness: Eric Beltran. is a 80 y.o. male last seen in May.  He is here for routine follow-up.  Doing well from a cardiac perspective, no angina in the last several weeks. Losartan was added at the last visit and his blood pressure has improved, he continues to track it at home.  I reviewed his medications which are noted below.  No spontaneous bleeding problems on Eliquis and no sense of palpitations.  He is tolerating bradycardia, heart rate generally in the 50s at rest.  He is using a walker at this time but plans to get his leg prosthesis at [facility] on Monday.  Past Medical History:  Diagnosis Date   Arthritis    Ascending aortic aneurysm ([place])    Status post repair 12/2011 at [facility]   BPH (benign prostatic hyperplasia)    Chronic diastolic CHF (congestive heart failure) (HCC)    Coronary atherosclerosis of native coronary artery    a. Multivessel status post prior stenting and ultimately CABG 12/2011 at [facility] with with LIMA to LAD, SVG to PLB, SVG to ramus, SVG to OM . b. Last cath 06/2014 (following ischemic nuc) -> medical therapy, no feasible way to revascularize LCx territory.   Essential hypertension    Mixed hyperlipidemia    Myocardial infarction [facility])    PAD (peripheral artery disease) (HCC)    PAF (paroxysmal atrial fibrillation) ([place])    Supraventricular tachycardia ([place])     Past Surgical History:  Procedure Laterality Date   ASCENDING AORTIC ANEURYSM REPAIR  12/2011 [facility]   26 mm.Dacron graft   CORONARY ARTERY BYPASS GRAFT  12/2011 [facility]   LIMA to LAD, SVG to PLB, SVG to ramus, SVG to OM   CORONARY ARTERY BYPASS GRAFT N/A 08/06/2017   Procedure: REDO CORONARY ARTERY BYPASS  GRAFTING (CABG) TIMES FOUR UTILIZING LEFT GREATER SAPHENOUS VEIN HAVESTED ENDOVASCULARLY, LEFT RADIAL ARTERY.  RIGHT AXILLARY CANNULATION;  Surgeon: Gaye Pollack, MD;  Location: [place];  Service: Open Heart Surgery;  Laterality: N/A;   CORONARY ATHERECTOMY N/A 02/10/2020   Procedure: CORONARY ATHERECTOMY;  Surgeon: [name], MD;  Location: [place] CV LAB;  Service: Cardiovascular;  Laterality: N/A;   CORONARY BALLOON ANGIOPLASTY N/A 06/10/2020   Procedure: CORONARY BALLOON ANGIOPLASTY;  Surgeon: Wellington Hampshire, MD;  Location: [place] CV LAB;  Service: Cardiovascular;  Laterality: N/A;   CORONARY STENT INTERVENTION N/A 02/10/2020   Procedure: CORONARY STENT INTERVENTION;  Surgeon: [name], MD;  Location: [place] CV LAB;  Service: Cardiovascular;  Laterality: N/A;   HERNIA REPAIR     LEFT HEART CATH AND CORS/GRAFTS ANGIOGRAPHY N/A 07/11/2017   Procedure: LEFT HEART CATH AND CORS/GRAFTS ANGIOGRAPHY;  Surgeon: Troy Sine, MD;  Location: [place] CV LAB;  Service: Cardiovascular;  Laterality: N/A;   LEFT HEART CATH AND CORS/GRAFTS ANGIOGRAPHY N/A 09/12/2018   Procedure: LEFT HEART CATH AND CORS/GRAFTS ANGIOGRAPHY;  Surgeon: Troy Sine, MD;  Location: [place] CV LAB;  Service: Cardiovascular;  Laterality: N/A;   LEFT HEART CATH AND CORS/GRAFTS ANGIOGRAPHY N/A 02/10/2020   Procedure: LEFT HEART CATH AND CORS/GRAFTS ANGIOGRAPHY;  Surgeon: [name], MD;  Location: [place] CV LAB;  Service:  Cardiovascular;  Laterality: N/A;   LEFT HEART CATH AND CORS/GRAFTS ANGIOGRAPHY N/A 06/10/2020   Procedure: LEFT HEART CATH AND CORS/GRAFTS ANGIOGRAPHY;  Surgeon: Wellington Hampshire, MD;  Location: [place] CV LAB;  Service: Cardiovascular;  Laterality: N/A;   LEFT HEART CATHETERIZATION WITH CORONARY ANGIOGRAM N/A 06/18/2014   Procedure: LEFT HEART CATHETERIZATION WITH CORONARY ANGIOGRAM;  Surgeon: Blane Ohara, MD;  Location: [facility] CATH LAB;  Service: Cardiovascular;   Laterality: N/A;   RADIAL ARTERY HARVEST Left 08/06/2017   Procedure: RADIAL ARTERY HARVEST;  Surgeon: Gaye Pollack, MD;  Location: [place];  Service: Open Heart Surgery;  Laterality: Left;   TEE WITHOUT CARDIOVERSION N/A 08/06/2017   Procedure: TRANSESOPHAGEAL ECHOCARDIOGRAM (TEE);  Surgeon: Gaye Pollack, MD;  Location: [place];  Service: Open Heart Surgery;  Laterality: N/A;   TOTAL HIP ARTHROPLASTY Right 02/07/2016   Procedure: RIGHT TOTAL HIP ARTHROPLASTY ANTERIOR APPROACH;  Surgeon: Mcarthur Rossetti, MD;  Location: [place];  Service: Orthopedics;  Laterality: Right;   TRANSURETHRAL RESECTION OF PROSTATE      Current Outpatient Medications  Medication Sig Dispense Refill   amiodarone (PACERONE) 200 MG tablet TAKE 1/2 TABLET BY MOUTH DAILY (cut in half) 45 tablet 3   atorvastatin (LIPITOR) 80 MG tablet TAKE 1 TABLET BY MOUTH AT BEDTIME 30 tablet 5   carboxymethylcellulose (REFRESH PLUS) 0.5 % SOLN Place 1 drop into both eyes daily as needed (Dry eyes).     clopidogrel (PLAVIX) 75 MG tablet TAKE 1 TABLET BY MOUTH DAILY WITH BREAKFAST 30 tablet 5   diltiazem (CARDIZEM CD) 240 MG 24 hr capsule TAKE ONE CAPSULE BY MOUTH DAILY 90 capsule 1   ELIQUIS 5 MG TABS tablet TAKE 1 TABLET BY MOUTH TWICE DAILY 180 tablet 1   finasteride (PROSCAR) 5 MG tablet Take 5 mg by mouth at bedtime.      furosemide (LASIX) 20 MG tablet TAKE TWO TABLETS BY MOUTH DAILY 180 tablet 3   gabapentin (NEURONTIN) 300 MG capsule Take 300 mg by mouth 3 (three) times daily.     isosorbide mononitrate (IMDUR) 60 MG 24 hr tablet TAKE 1 TABLET BY MOUTH TWICE DAILY every morning AND in THE evening 60 tablet 3   losartan (COZAAR) 25 MG tablet Take 1 tablet (25 mg total) by mouth daily. 90 tablet 1   metoprolol tartrate (LOPRESSOR) 25 MG tablet TAKE 1 TABLET BY MOUTH TWICE DAILY 90 tablet 3   Multiple Vitamin (MULTIVITAMIN) tablet Take 1 tablet by mouth at bedtime.     nitroGLYCERIN (NITROSTAT) 0.4 MG SL tablet DISSOLVE 1 TABLET  UNDER THE TONGUE EVERY 5 MINUTES AS NEEDED FOR CHEST PAIN. DO NOT EXCEED A TOTAL OF 3 DOSES IN 15 MINUTES. 25 tablet 3   Omega-3 Fatty Acids (FISH OIL) 1000 MG CAPS Take 1,000-2,000 mg by mouth at bedtime.     tamsulosin (FLOMAX) 0.4 MG CAPS capsule Take 0.4 mg by mouth at bedtime.     triamcinolone ointment (KENALOG) 0.1 % Apply 1 application topically daily as needed (itching).     valACYclovir (VALTREX) 500 MG tablet Take 500 mg by mouth daily as needed (fever blisters).      vitamin C (ASCORBIC ACID) 500 MG tablet Take 500 mg by mouth daily.     zinc gluconate 50 MG tablet Take 50 mg by mouth at bedtime.      No current facility-administered medications for this visit.   Allergies:  Chocolate flavor   ROS: No dizziness or syncope.  Physical Exam:  VS:  BP (!) 155/71   Pulse (!) 46   Ht 5\' 7"  (1.702 m)   Wt 177 lb 6.4 oz (80.5 kg)   SpO2 99%   BMI 27.78 kg/m , BMI Body mass index is 27.78 kg/m.  Wt Readings from Last 3 Encounters:  11/25/20 177 lb 6.4 oz (80.5 kg)  09/27/20 174 lb (78.9 kg)  06/20/20 189 lb (85.7 kg)    General: Patient appears comfortable at rest.  Using a walker. HEENT: Conjunctiva and lids normal, wearing a mask. Neck: Supple, no elevated JVP or carotid bruits, no thyromegaly. Lungs: Clear to auscultation, nonlabored breathing at rest. Cardiac: Regular rate and rhythm, no S3, 2/6 diastolic murmur, no pericardial rub. Extremities: Status post right AKA.  ECG:  An ECG dated 06/10/2020 was personally reviewed today and demonstrated:  Sinus bradycardia with prolonged PR interval and old inferior infarct pattern.  Recent Labwork: 06/09/2020: BUN 17; Creatinine, Ser 0.92; Hemoglobin 13.9; Platelets 233; Potassium 3.4; Sodium 137  June 2022: Hemoglobin A1c 5.8%, cholesterol 119, triglycerides 94, HDL 41, LDL 60, BUN 18, creatinine 1.16, potassium 4.1, AST 14, ALT 10, hemoglobin 12.0, platelets 289  Other Studies Reviewed Today:  Echocardiogram  08/20/2020: Summary    1. The left ventricle is normal in size with moderately increased wall  thickness.    2. The left ventricular systolic function is normal, LVEF is visually  estimated at > 55%.    3. There is mild aortic regurgitation.    4. The left atrium is mildly dilated in size.    5. The right ventricle is not well visualized but probably normal in size,  with normal systolic function.    6. The aortic root is mildly dilated.   Assessment and Plan:  1. Multivessel CAD status post CABG in 2013 with concurrent repair of ascending aortic aneurysm at [facility]. He underwent redo operation in May 2019 in the setting of graft failure with placement of radial to the diagonal, SVG to OM, and SVG to PDA and PL 2.  Since that time he has had further graft failure and revascularization of the OM and RCA distributions.  Status post DES x2 to the RCA in September 2021, and angioplasty of same region due to in-stent restenosis in January of this year.  He has done well recently with no active angina in the last several weeks.  Plan to continue current regimen including Plavix, Lipitor, Cozaar, Imdur, and Lopressor.  2.  Paroxysmal atrial fibrillation with CHA2DS2-VASc score of 4.  He remains on Eliquis for stroke prophylaxis, also low-dose amiodarone and Cardizem CD.  3.  Essential hypertension, tolerating addition of Cozaar.  Continue to track blood pressure at home, may need further up titration.  4.  History of right popliteal DVT and subsequently diagnosed sarcoma now status post above-the-knee amputation on the right.  He anticipates prosthesis at [facility] on Monday.  Medication Adjustments/Labs and Tests Ordered: Current medicines are reviewed at length with the patient today.  Concerns regarding medicines are outlined above.   Tests Ordered: No orders of the defined types were placed in this encounter.   Medication Changes: No orders of the defined types were placed in  this encounter.   Disposition:  Follow up  three months.  Signed, Satira Sark, MD, [facility] 11/25/2020 10:20 AM    [place] [facility] [place], [place], [place] [address] Phone: [phone]; Fax: [phone]

## 2020-11-28 ENCOUNTER — Ambulatory Visit: Admit: 2020-11-28 | Discharge: 2020-11-29 | Payer: MEDICARE

## 2020-11-28 DIAGNOSIS — C4921 Malignant neoplasm of connective and soft tissue of right lower limb, including hip: Secondary | ICD-10-CM | POA: Diagnosis not present

## 2020-11-30 ENCOUNTER — Other Ambulatory Visit: Payer: Self-pay | Admitting: Cardiology

## 2020-11-30 DIAGNOSIS — C499 Malignant neoplasm of connective and soft tissue, unspecified: Principal | ICD-10-CM

## 2020-11-30 DIAGNOSIS — Z89611 Acquired absence of right leg above knee: Principal | ICD-10-CM

## 2020-12-05 ENCOUNTER — Ambulatory Visit: Admit: 2020-12-05 | Discharge: 2020-12-06 | Payer: MEDICARE

## 2020-12-07 ENCOUNTER — Ambulatory Visit: Admit: 2020-12-07 | Discharge: 2020-12-08 | Payer: MEDICARE | Attending: Vascular Surgery | Primary: Vascular Surgery

## 2020-12-07 ENCOUNTER — Ambulatory Visit: Admit: 2020-12-07 | Discharge: 2020-12-08 | Payer: MEDICARE

## 2020-12-07 DIAGNOSIS — Z86718 Personal history of other venous thrombosis and embolism: Secondary | ICD-10-CM | POA: Diagnosis not present

## 2020-12-07 DIAGNOSIS — R590 Localized enlarged lymph nodes: Secondary | ICD-10-CM | POA: Diagnosis not present

## 2020-12-07 DIAGNOSIS — R609 Edema, unspecified: Secondary | ICD-10-CM | POA: Diagnosis not present

## 2020-12-07 DIAGNOSIS — M7989 Other specified soft tissue disorders: Principal | ICD-10-CM

## 2020-12-07 DIAGNOSIS — C499 Malignant neoplasm of connective and soft tissue, unspecified: Principal | ICD-10-CM

## 2020-12-08 ENCOUNTER — Ambulatory Visit: Admit: 2020-12-08 | Payer: MEDICARE

## 2020-12-08 ENCOUNTER — Ambulatory Visit: Admit: 2020-12-08 | Discharge: 2021-01-06 | Payer: MEDICARE

## 2020-12-08 DIAGNOSIS — R2681 Unsteadiness on feet: Secondary | ICD-10-CM | POA: Diagnosis not present

## 2020-12-08 DIAGNOSIS — G546 Phantom limb syndrome with pain: Secondary | ICD-10-CM | POA: Diagnosis not present

## 2020-12-08 DIAGNOSIS — C499 Malignant neoplasm of connective and soft tissue, unspecified: Secondary | ICD-10-CM | POA: Diagnosis not present

## 2020-12-08 DIAGNOSIS — R29898 Other symptoms and signs involving the musculoskeletal system: Secondary | ICD-10-CM | POA: Diagnosis not present

## 2020-12-08 DIAGNOSIS — Z89611 Acquired absence of right leg above knee: Principal | ICD-10-CM

## 2020-12-13 DIAGNOSIS — R2681 Unsteadiness on feet: Secondary | ICD-10-CM | POA: Diagnosis not present

## 2020-12-13 DIAGNOSIS — G546 Phantom limb syndrome with pain: Secondary | ICD-10-CM | POA: Diagnosis not present

## 2020-12-13 DIAGNOSIS — C499 Malignant neoplasm of connective and soft tissue, unspecified: Secondary | ICD-10-CM | POA: Diagnosis not present

## 2020-12-13 DIAGNOSIS — R29898 Other symptoms and signs involving the musculoskeletal system: Secondary | ICD-10-CM | POA: Diagnosis not present

## 2020-12-13 DIAGNOSIS — R262 Difficulty in walking, not elsewhere classified: Principal | ICD-10-CM

## 2020-12-13 DIAGNOSIS — Z89611 Acquired absence of right leg above knee: Principal | ICD-10-CM

## 2020-12-15 DIAGNOSIS — C499 Malignant neoplasm of connective and soft tissue, unspecified: Secondary | ICD-10-CM | POA: Diagnosis not present

## 2020-12-15 DIAGNOSIS — R29898 Other symptoms and signs involving the musculoskeletal system: Secondary | ICD-10-CM | POA: Diagnosis not present

## 2020-12-15 DIAGNOSIS — R2681 Unsteadiness on feet: Principal | ICD-10-CM

## 2020-12-15 DIAGNOSIS — R262 Difficulty in walking, not elsewhere classified: Principal | ICD-10-CM

## 2020-12-15 DIAGNOSIS — Z89611 Acquired absence of right leg above knee: Principal | ICD-10-CM

## 2020-12-15 DIAGNOSIS — G546 Phantom limb syndrome with pain: Secondary | ICD-10-CM | POA: Diagnosis not present

## 2020-12-20 DIAGNOSIS — G546 Phantom limb syndrome with pain: Secondary | ICD-10-CM | POA: Diagnosis not present

## 2020-12-20 DIAGNOSIS — R2681 Unsteadiness on feet: Secondary | ICD-10-CM | POA: Diagnosis not present

## 2020-12-20 DIAGNOSIS — Z89611 Acquired absence of right leg above knee: Secondary | ICD-10-CM | POA: Diagnosis not present

## 2020-12-20 DIAGNOSIS — C499 Malignant neoplasm of connective and soft tissue, unspecified: Secondary | ICD-10-CM | POA: Diagnosis not present

## 2020-12-20 DIAGNOSIS — R29898 Other symptoms and signs involving the musculoskeletal system: Secondary | ICD-10-CM | POA: Diagnosis not present

## 2020-12-20 DIAGNOSIS — R262 Difficulty in walking, not elsewhere classified: Principal | ICD-10-CM

## 2020-12-22 DIAGNOSIS — R2681 Unsteadiness on feet: Secondary | ICD-10-CM | POA: Diagnosis not present

## 2020-12-22 DIAGNOSIS — R29898 Other symptoms and signs involving the musculoskeletal system: Secondary | ICD-10-CM | POA: Diagnosis not present

## 2020-12-22 DIAGNOSIS — C499 Malignant neoplasm of connective and soft tissue, unspecified: Secondary | ICD-10-CM | POA: Diagnosis not present

## 2020-12-22 DIAGNOSIS — Z89611 Acquired absence of right leg above knee: Principal | ICD-10-CM

## 2020-12-22 DIAGNOSIS — R262 Difficulty in walking, not elsewhere classified: Principal | ICD-10-CM

## 2020-12-22 DIAGNOSIS — G546 Phantom limb syndrome with pain: Secondary | ICD-10-CM | POA: Diagnosis not present

## 2020-12-27 ENCOUNTER — Ambulatory Visit: Admit: 2020-12-27 | Discharge: 2020-12-27 | Payer: MEDICARE

## 2020-12-27 DIAGNOSIS — C7802 Secondary malignant neoplasm of left lung: Secondary | ICD-10-CM | POA: Diagnosis not present

## 2020-12-27 DIAGNOSIS — Z89611 Acquired absence of right leg above knee: Secondary | ICD-10-CM | POA: Diagnosis not present

## 2020-12-27 DIAGNOSIS — I10 Essential (primary) hypertension: Secondary | ICD-10-CM | POA: Diagnosis not present

## 2020-12-27 DIAGNOSIS — I2510 Atherosclerotic heart disease of native coronary artery without angina pectoris: Principal | ICD-10-CM

## 2020-12-27 DIAGNOSIS — C499 Malignant neoplasm of connective and soft tissue, unspecified: Principal | ICD-10-CM

## 2020-12-27 DIAGNOSIS — I712 Thoracic aortic aneurysm, without rupture: Principal | ICD-10-CM

## 2020-12-28 DIAGNOSIS — R29898 Other symptoms and signs involving the musculoskeletal system: Secondary | ICD-10-CM | POA: Diagnosis not present

## 2020-12-28 DIAGNOSIS — G546 Phantom limb syndrome with pain: Secondary | ICD-10-CM | POA: Diagnosis not present

## 2020-12-28 DIAGNOSIS — R2681 Unsteadiness on feet: Secondary | ICD-10-CM | POA: Diagnosis not present

## 2020-12-28 DIAGNOSIS — Z89611 Acquired absence of right leg above knee: Secondary | ICD-10-CM | POA: Diagnosis not present

## 2020-12-28 DIAGNOSIS — C499 Malignant neoplasm of connective and soft tissue, unspecified: Secondary | ICD-10-CM | POA: Diagnosis not present

## 2020-12-28 DIAGNOSIS — R262 Difficulty in walking, not elsewhere classified: Principal | ICD-10-CM

## 2020-12-30 ENCOUNTER — Other Ambulatory Visit: Payer: Self-pay | Admitting: Cardiology

## 2020-12-30 DIAGNOSIS — R2681 Unsteadiness on feet: Secondary | ICD-10-CM | POA: Diagnosis not present

## 2020-12-30 DIAGNOSIS — Z89611 Acquired absence of right leg above knee: Secondary | ICD-10-CM | POA: Diagnosis not present

## 2020-12-30 DIAGNOSIS — G546 Phantom limb syndrome with pain: Secondary | ICD-10-CM | POA: Diagnosis not present

## 2020-12-30 DIAGNOSIS — C499 Malignant neoplasm of connective and soft tissue, unspecified: Secondary | ICD-10-CM | POA: Diagnosis not present

## 2020-12-30 DIAGNOSIS — R29898 Other symptoms and signs involving the musculoskeletal system: Secondary | ICD-10-CM | POA: Diagnosis not present

## 2021-01-04 DIAGNOSIS — C499 Malignant neoplasm of connective and soft tissue, unspecified: Secondary | ICD-10-CM | POA: Diagnosis not present

## 2021-01-04 DIAGNOSIS — R29898 Other symptoms and signs involving the musculoskeletal system: Secondary | ICD-10-CM | POA: Diagnosis not present

## 2021-01-04 DIAGNOSIS — G546 Phantom limb syndrome with pain: Secondary | ICD-10-CM | POA: Diagnosis not present

## 2021-01-04 DIAGNOSIS — R2681 Unsteadiness on feet: Secondary | ICD-10-CM | POA: Diagnosis not present

## 2021-01-04 DIAGNOSIS — Z89611 Acquired absence of right leg above knee: Principal | ICD-10-CM

## 2021-01-04 DIAGNOSIS — R262 Difficulty in walking, not elsewhere classified: Principal | ICD-10-CM

## 2021-01-06 DIAGNOSIS — R29898 Other symptoms and signs involving the musculoskeletal system: Secondary | ICD-10-CM | POA: Diagnosis not present

## 2021-01-06 DIAGNOSIS — R2681 Unsteadiness on feet: Secondary | ICD-10-CM | POA: Diagnosis not present

## 2021-01-06 DIAGNOSIS — G546 Phantom limb syndrome with pain: Secondary | ICD-10-CM | POA: Diagnosis not present

## 2021-01-06 DIAGNOSIS — C499 Malignant neoplasm of connective and soft tissue, unspecified: Principal | ICD-10-CM

## 2021-01-06 DIAGNOSIS — R262 Difficulty in walking, not elsewhere classified: Principal | ICD-10-CM

## 2021-01-06 DIAGNOSIS — Z89611 Acquired absence of right leg above knee: Principal | ICD-10-CM

## 2021-01-10 ENCOUNTER — Ambulatory Visit: Admit: 2021-01-10 | Payer: MEDICARE

## 2021-01-10 ENCOUNTER — Ambulatory Visit: Admit: 2021-01-10 | Discharge: 2021-02-05 | Payer: MEDICARE

## 2021-01-10 DIAGNOSIS — M6281 Muscle weakness (generalized): Secondary | ICD-10-CM | POA: Diagnosis not present

## 2021-01-10 DIAGNOSIS — Z89611 Acquired absence of right leg above knee: Secondary | ICD-10-CM | POA: Diagnosis not present

## 2021-01-10 DIAGNOSIS — C4921 Malignant neoplasm of connective and soft tissue of right lower limb, including hip: Secondary | ICD-10-CM | POA: Diagnosis not present

## 2021-01-10 DIAGNOSIS — R2681 Unsteadiness on feet: Secondary | ICD-10-CM | POA: Diagnosis not present

## 2021-01-10 DIAGNOSIS — R29898 Other symptoms and signs involving the musculoskeletal system: Secondary | ICD-10-CM | POA: Diagnosis not present

## 2021-01-11 DIAGNOSIS — Z89611 Acquired absence of right leg above knee: Secondary | ICD-10-CM | POA: Diagnosis not present

## 2021-01-11 DIAGNOSIS — C4921 Malignant neoplasm of connective and soft tissue of right lower limb, including hip: Secondary | ICD-10-CM | POA: Diagnosis not present

## 2021-01-11 DIAGNOSIS — R2681 Unsteadiness on feet: Secondary | ICD-10-CM | POA: Diagnosis not present

## 2021-01-11 DIAGNOSIS — M6281 Muscle weakness (generalized): Secondary | ICD-10-CM | POA: Diagnosis not present

## 2021-01-11 DIAGNOSIS — R29898 Other symptoms and signs involving the musculoskeletal system: Secondary | ICD-10-CM | POA: Diagnosis not present

## 2021-01-17 DIAGNOSIS — R2681 Unsteadiness on feet: Secondary | ICD-10-CM | POA: Diagnosis not present

## 2021-01-17 DIAGNOSIS — Z89611 Acquired absence of right leg above knee: Secondary | ICD-10-CM | POA: Diagnosis not present

## 2021-01-17 DIAGNOSIS — M6281 Muscle weakness (generalized): Secondary | ICD-10-CM | POA: Diagnosis not present

## 2021-01-17 DIAGNOSIS — C4921 Malignant neoplasm of connective and soft tissue of right lower limb, including hip: Secondary | ICD-10-CM | POA: Diagnosis not present

## 2021-01-17 DIAGNOSIS — R262 Difficulty in walking, not elsewhere classified: Principal | ICD-10-CM

## 2021-01-17 DIAGNOSIS — C499 Malignant neoplasm of connective and soft tissue, unspecified: Principal | ICD-10-CM

## 2021-01-17 DIAGNOSIS — R29898 Other symptoms and signs involving the musculoskeletal system: Secondary | ICD-10-CM | POA: Diagnosis not present

## 2021-01-18 DIAGNOSIS — Z89611 Acquired absence of right leg above knee: Secondary | ICD-10-CM | POA: Diagnosis not present

## 2021-01-18 DIAGNOSIS — C4921 Malignant neoplasm of connective and soft tissue of right lower limb, including hip: Secondary | ICD-10-CM | POA: Diagnosis not present

## 2021-01-18 DIAGNOSIS — M6281 Muscle weakness (generalized): Secondary | ICD-10-CM | POA: Diagnosis not present

## 2021-01-18 DIAGNOSIS — C499 Malignant neoplasm of connective and soft tissue, unspecified: Principal | ICD-10-CM

## 2021-01-18 DIAGNOSIS — R262 Difficulty in walking, not elsewhere classified: Principal | ICD-10-CM

## 2021-01-18 DIAGNOSIS — R2681 Unsteadiness on feet: Principal | ICD-10-CM

## 2021-01-18 DIAGNOSIS — R29898 Other symptoms and signs involving the musculoskeletal system: Secondary | ICD-10-CM | POA: Diagnosis not present

## 2021-01-19 ENCOUNTER — Ambulatory Visit: Admit: 2021-01-19 | Discharge: 2021-01-20 | Payer: MEDICARE

## 2021-01-24 DIAGNOSIS — C4921 Malignant neoplasm of connective and soft tissue of right lower limb, including hip: Secondary | ICD-10-CM | POA: Diagnosis not present

## 2021-01-24 DIAGNOSIS — R29898 Other symptoms and signs involving the musculoskeletal system: Secondary | ICD-10-CM | POA: Diagnosis not present

## 2021-01-24 DIAGNOSIS — Z89611 Acquired absence of right leg above knee: Secondary | ICD-10-CM | POA: Diagnosis not present

## 2021-01-24 DIAGNOSIS — R2681 Unsteadiness on feet: Secondary | ICD-10-CM | POA: Diagnosis not present

## 2021-01-24 DIAGNOSIS — M6281 Muscle weakness (generalized): Secondary | ICD-10-CM | POA: Diagnosis not present

## 2021-01-31 DIAGNOSIS — M6281 Muscle weakness (generalized): Secondary | ICD-10-CM | POA: Diagnosis not present

## 2021-01-31 DIAGNOSIS — N401 Enlarged prostate with lower urinary tract symptoms: Secondary | ICD-10-CM | POA: Diagnosis not present

## 2021-01-31 DIAGNOSIS — R2681 Unsteadiness on feet: Secondary | ICD-10-CM | POA: Diagnosis not present

## 2021-01-31 DIAGNOSIS — C4921 Malignant neoplasm of connective and soft tissue of right lower limb, including hip: Secondary | ICD-10-CM | POA: Diagnosis not present

## 2021-01-31 DIAGNOSIS — N390 Urinary tract infection, site not specified: Secondary | ICD-10-CM | POA: Diagnosis not present

## 2021-01-31 DIAGNOSIS — R29898 Other symptoms and signs involving the musculoskeletal system: Secondary | ICD-10-CM | POA: Diagnosis not present

## 2021-01-31 DIAGNOSIS — Z89611 Acquired absence of right leg above knee: Principal | ICD-10-CM

## 2021-01-31 DIAGNOSIS — R262 Difficulty in walking, not elsewhere classified: Principal | ICD-10-CM

## 2021-01-31 DIAGNOSIS — C499 Malignant neoplasm of connective and soft tissue, unspecified: Principal | ICD-10-CM

## 2021-02-01 MED ORDER — GABAPENTIN 300 MG CAPSULE
ORAL_CAPSULE | 0 refills | 0 days | Status: CP
Start: 2021-02-01 — End: ?

## 2021-02-02 ENCOUNTER — Ambulatory Visit: Admit: 2021-02-02 | Discharge: 2021-02-03 | Payer: MEDICARE

## 2021-02-03 DIAGNOSIS — M6281 Muscle weakness (generalized): Secondary | ICD-10-CM | POA: Diagnosis not present

## 2021-02-03 DIAGNOSIS — C4921 Malignant neoplasm of connective and soft tissue of right lower limb, including hip: Secondary | ICD-10-CM | POA: Diagnosis not present

## 2021-02-03 DIAGNOSIS — R29898 Other symptoms and signs involving the musculoskeletal system: Secondary | ICD-10-CM | POA: Diagnosis not present

## 2021-02-03 DIAGNOSIS — Z89611 Acquired absence of right leg above knee: Principal | ICD-10-CM

## 2021-02-03 DIAGNOSIS — C499 Malignant neoplasm of connective and soft tissue, unspecified: Principal | ICD-10-CM

## 2021-02-03 DIAGNOSIS — R2681 Unsteadiness on feet: Principal | ICD-10-CM

## 2021-02-03 DIAGNOSIS — R262 Difficulty in walking, not elsewhere classified: Principal | ICD-10-CM

## 2021-02-08 ENCOUNTER — Ambulatory Visit: Admit: 2021-02-08 | Payer: MEDICARE

## 2021-02-08 ENCOUNTER — Ambulatory Visit: Admit: 2021-02-08 | Discharge: 2021-03-07 | Payer: MEDICARE

## 2021-02-08 DIAGNOSIS — Z89611 Acquired absence of right leg above knee: Secondary | ICD-10-CM | POA: Diagnosis not present

## 2021-02-08 DIAGNOSIS — R2681 Unsteadiness on feet: Secondary | ICD-10-CM | POA: Diagnosis not present

## 2021-02-08 DIAGNOSIS — R262 Difficulty in walking, not elsewhere classified: Principal | ICD-10-CM

## 2021-02-08 DIAGNOSIS — C499 Malignant neoplasm of connective and soft tissue, unspecified: Principal | ICD-10-CM

## 2021-02-10 MED ORDER — TAMSULOSIN 0.4 MG CAPSULE
ORAL_CAPSULE | 5 refills | 0 days | Status: CP
Start: 2021-02-10 — End: ?

## 2021-02-15 DIAGNOSIS — R2681 Unsteadiness on feet: Secondary | ICD-10-CM | POA: Diagnosis not present

## 2021-02-15 DIAGNOSIS — Z89611 Acquired absence of right leg above knee: Secondary | ICD-10-CM | POA: Diagnosis not present

## 2021-02-15 DIAGNOSIS — C499 Malignant neoplasm of connective and soft tissue, unspecified: Principal | ICD-10-CM

## 2021-02-15 DIAGNOSIS — R262 Difficulty in walking, not elsewhere classified: Principal | ICD-10-CM

## 2021-02-17 DIAGNOSIS — Z89611 Acquired absence of right leg above knee: Secondary | ICD-10-CM | POA: Diagnosis not present

## 2021-02-17 DIAGNOSIS — R2681 Unsteadiness on feet: Secondary | ICD-10-CM | POA: Diagnosis not present

## 2021-02-17 DIAGNOSIS — R262 Difficulty in walking, not elsewhere classified: Principal | ICD-10-CM

## 2021-02-17 DIAGNOSIS — C499 Malignant neoplasm of connective and soft tissue, unspecified: Secondary | ICD-10-CM | POA: Diagnosis not present

## 2021-02-21 ENCOUNTER — Ambulatory Visit: Admit: 2021-02-21 | Discharge: 2021-02-21 | Payer: MEDICARE

## 2021-02-21 DIAGNOSIS — N289 Disorder of kidney and ureter, unspecified: Secondary | ICD-10-CM | POA: Diagnosis not present

## 2021-02-21 DIAGNOSIS — I700 Atherosclerosis of aorta: Secondary | ICD-10-CM | POA: Diagnosis not present

## 2021-02-21 DIAGNOSIS — K5730 Diverticulosis of large intestine without perforation or abscess without bleeding: Secondary | ICD-10-CM | POA: Diagnosis not present

## 2021-02-21 DIAGNOSIS — C499 Malignant neoplasm of connective and soft tissue, unspecified: Secondary | ICD-10-CM | POA: Diagnosis not present

## 2021-02-21 DIAGNOSIS — R2681 Unsteadiness on feet: Secondary | ICD-10-CM | POA: Diagnosis not present

## 2021-02-21 DIAGNOSIS — I7121 Aneurysm of the ascending aorta, without rupture: Secondary | ICD-10-CM | POA: Diagnosis not present

## 2021-02-21 DIAGNOSIS — R918 Other nonspecific abnormal finding of lung field: Secondary | ICD-10-CM | POA: Diagnosis not present

## 2021-02-21 DIAGNOSIS — Z85831 Personal history of malignant neoplasm of soft tissue: Secondary | ICD-10-CM | POA: Diagnosis not present

## 2021-02-21 DIAGNOSIS — Z89611 Acquired absence of right leg above knee: Principal | ICD-10-CM

## 2021-02-21 DIAGNOSIS — R262 Difficulty in walking, not elsewhere classified: Principal | ICD-10-CM

## 2021-02-21 DIAGNOSIS — Z9889 Other specified postprocedural states: Secondary | ICD-10-CM | POA: Diagnosis not present

## 2021-02-24 DIAGNOSIS — Z89611 Acquired absence of right leg above knee: Secondary | ICD-10-CM | POA: Diagnosis not present

## 2021-02-24 DIAGNOSIS — C499 Malignant neoplasm of connective and soft tissue, unspecified: Secondary | ICD-10-CM | POA: Diagnosis not present

## 2021-02-24 DIAGNOSIS — R262 Difficulty in walking, not elsewhere classified: Principal | ICD-10-CM

## 2021-02-24 DIAGNOSIS — R2681 Unsteadiness on feet: Principal | ICD-10-CM

## 2021-02-28 ENCOUNTER — Other Ambulatory Visit: Payer: Self-pay | Admitting: Cardiology

## 2021-02-28 ENCOUNTER — Other Ambulatory Visit: Admit: 2021-02-28 | Discharge: 2021-02-28 | Payer: MEDICARE

## 2021-02-28 ENCOUNTER — Ambulatory Visit: Admit: 2021-02-28 | Discharge: 2021-03-01 | Payer: MEDICARE

## 2021-02-28 ENCOUNTER — Ambulatory Visit: Admit: 2021-02-28 | Discharge: 2021-02-28 | Payer: MEDICARE

## 2021-02-28 DIAGNOSIS — Z951 Presence of aortocoronary bypass graft: Secondary | ICD-10-CM | POA: Diagnosis not present

## 2021-02-28 DIAGNOSIS — N2889 Other specified disorders of kidney and ureter: Secondary | ICD-10-CM | POA: Diagnosis not present

## 2021-02-28 DIAGNOSIS — E782 Mixed hyperlipidemia: Secondary | ICD-10-CM | POA: Diagnosis not present

## 2021-02-28 DIAGNOSIS — I2510 Atherosclerotic heart disease of native coronary artery without angina pectoris: Secondary | ICD-10-CM | POA: Diagnosis not present

## 2021-02-28 DIAGNOSIS — R918 Other nonspecific abnormal finding of lung field: Secondary | ICD-10-CM | POA: Diagnosis not present

## 2021-02-28 DIAGNOSIS — I712 Thoracic aortic aneurysm, without rupture, unspecified: Secondary | ICD-10-CM | POA: Diagnosis not present

## 2021-02-28 DIAGNOSIS — I10 Essential (primary) hypertension: Secondary | ICD-10-CM | POA: Diagnosis not present

## 2021-02-28 DIAGNOSIS — C499 Malignant neoplasm of connective and soft tissue, unspecified: Principal | ICD-10-CM

## 2021-02-28 NOTE — Telephone Encounter (Signed)
Prescription refill request for Eliquis received. Indication: PAF Last office visit: 11/25/20  Myles Gip MD Scr: 1.13 on 02/21/21 Age: 80 Weight: 80.5kg  Based on above findings Eliquis 5mg  twice daily is the appropriate dose.  Refill approved.

## 2021-03-01 ENCOUNTER — Telehealth: Payer: Self-pay | Admitting: Cardiology

## 2021-03-01 DIAGNOSIS — R2681 Unsteadiness on feet: Secondary | ICD-10-CM | POA: Diagnosis not present

## 2021-03-01 DIAGNOSIS — Z89611 Acquired absence of right leg above knee: Secondary | ICD-10-CM | POA: Diagnosis not present

## 2021-03-01 DIAGNOSIS — C499 Malignant neoplasm of connective and soft tissue, unspecified: Principal | ICD-10-CM

## 2021-03-01 NOTE — Telephone Encounter (Signed)
Pt called stating that his heart rate is around the 30's for about a week, it goes back up in the 50's after a while.   Wants to know if he needs to change one of his medications?  BP is still on the high side, top number is around 160-170   Please call [phone]

## 2021-03-01 NOTE — Telephone Encounter (Signed)
Reports checking BP and HR with digital BP cuff and pulse oximeter Advised that digital monitors don't always give accurate readings Reports fatigue and SOB that he's had for a while and is unchanged Denies chest pain, dizziness, or lightheadedness.  Reports HR ranges between 37- 45 Medications reviewed Advised to break metoprolol in 1/2 for 12.5 mg twice daily Offered appointment for this Friday but declined saying he will keep visit for next week 03/09/21 Advised to continue monitoring vitals Advised if symptoms get worse, to go the ED for an evaluation Verbalized understanding of plan

## 2021-03-02 DIAGNOSIS — Z89611 Acquired absence of right leg above knee: Secondary | ICD-10-CM | POA: Diagnosis not present

## 2021-03-02 DIAGNOSIS — R2681 Unsteadiness on feet: Secondary | ICD-10-CM | POA: Diagnosis not present

## 2021-03-02 DIAGNOSIS — C499 Malignant neoplasm of connective and soft tissue, unspecified: Secondary | ICD-10-CM | POA: Diagnosis not present

## 2021-03-02 DIAGNOSIS — R262 Difficulty in walking, not elsewhere classified: Principal | ICD-10-CM

## 2021-03-08 ENCOUNTER — Ambulatory Visit: Admit: 2021-03-08 | Discharge: 2021-03-09 | Payer: MEDICARE

## 2021-03-08 DIAGNOSIS — Z48813 Encounter for surgical aftercare following surgery on the respiratory system: Secondary | ICD-10-CM | POA: Diagnosis not present

## 2021-03-08 DIAGNOSIS — C7989 Secondary malignant neoplasm of other specified sites: Secondary | ICD-10-CM | POA: Diagnosis not present

## 2021-03-08 DIAGNOSIS — J939 Pneumothorax, unspecified: Secondary | ICD-10-CM | POA: Diagnosis not present

## 2021-03-08 DIAGNOSIS — R918 Other nonspecific abnormal finding of lung field: Secondary | ICD-10-CM | POA: Diagnosis not present

## 2021-03-08 DIAGNOSIS — Z452 Encounter for adjustment and management of vascular access device: Secondary | ICD-10-CM | POA: Diagnosis not present

## 2021-03-08 DIAGNOSIS — J811 Chronic pulmonary edema: Secondary | ICD-10-CM | POA: Diagnosis not present

## 2021-03-08 DIAGNOSIS — Z7901 Long term (current) use of anticoagulants: Secondary | ICD-10-CM | POA: Diagnosis not present

## 2021-03-08 DIAGNOSIS — C499 Malignant neoplasm of connective and soft tissue, unspecified: Secondary | ICD-10-CM | POA: Diagnosis not present

## 2021-03-08 DIAGNOSIS — E7800 Pure hypercholesterolemia, unspecified: Secondary | ICD-10-CM | POA: Diagnosis not present

## 2021-03-08 DIAGNOSIS — I10 Essential (primary) hypertension: Secondary | ICD-10-CM | POA: Diagnosis not present

## 2021-03-08 DIAGNOSIS — Z7902 Long term (current) use of antithrombotics/antiplatelets: Secondary | ICD-10-CM | POA: Diagnosis not present

## 2021-03-09 ENCOUNTER — Ambulatory Visit (INDEPENDENT_AMBULATORY_CARE_PROVIDER_SITE_OTHER): Payer: Medicare Other | Admitting: Cardiology

## 2021-03-09 ENCOUNTER — Other Ambulatory Visit: Payer: Self-pay

## 2021-03-09 ENCOUNTER — Encounter: Payer: Self-pay | Admitting: Cardiology

## 2021-03-09 VITALS — BP 156/78 | HR 51 | Ht 67.0 in | Wt 191.2 lb

## 2021-03-09 DIAGNOSIS — I25119 Atherosclerotic heart disease of native coronary artery with unspecified angina pectoris: Secondary | ICD-10-CM | POA: Diagnosis not present

## 2021-03-09 DIAGNOSIS — I480 Paroxysmal atrial fibrillation: Secondary | ICD-10-CM | POA: Diagnosis not present

## 2021-03-09 NOTE — Progress Notes (Signed)
Cardiology Office Note  Date: 03/09/2021   ID: Eric Beltran., DOB [birth year], MRN [id number]  PCP:  Celene Squibb, MD  Cardiologist:  Rozann Lesches, MD Electrophysiologist:  None   Chief Complaint  Patient presents with   Cardiac follow-up    History of Present Illness: Eric Beltran. is a 80 y.o. male last seen in July.  He is here for a follow-up visit.  He does not report any angina symptoms or palpitations at this time.  Working with rehabilitation to get used to his right leg prosthesis.  Recently Lopressor was reduced to 12.5 mg twice daily in light of bradycardia documented by digital cuff at home.  His heart rate is 51 today by ECG, we have discussed stopping Lopressor altogether.  He continues on amiodarone and Cardizem CD otherwise.  He does not report any active bleeding problems on Eliquis.  Past Medical History:  Diagnosis Date   Arthritis    Ascending aortic aneurysm    Status post repair 12/2011 at [facility]   BPH (benign prostatic hyperplasia)    Chronic diastolic CHF (congestive heart failure) (HCC)    Coronary atherosclerosis of native coronary artery    a. Multivessel status post prior stenting and ultimately CABG 12/2011 at [facility] with with LIMA to LAD, SVG to PLB, SVG to ramus, SVG to OM . b. Last cath 06/2014 (following ischemic nuc) -> medical therapy, no feasible way to revascularize LCx territory.   Essential hypertension    Mixed hyperlipidemia    Myocardial infarction [facility])    PAD (peripheral artery disease) (HCC)    PAF (paroxysmal atrial fibrillation) ([place])    Supraventricular tachycardia ([place])     Past Surgical History:  Procedure Laterality Date   ASCENDING AORTIC ANEURYSM REPAIR  12/2011 [facility]   26 mm.Dacron graft   CORONARY ARTERY BYPASS GRAFT  12/2011 [facility]   LIMA to LAD, SVG to PLB, SVG to ramus, SVG to OM   CORONARY ARTERY BYPASS GRAFT N/A 08/06/2017   Procedure: REDO CORONARY ARTERY BYPASS GRAFTING (CABG) TIMES FOUR UTILIZING  LEFT GREATER SAPHENOUS VEIN HAVESTED ENDOVASCULARLY, LEFT RADIAL ARTERY.  RIGHT AXILLARY CANNULATION;  Surgeon: Gaye Pollack, MD;  Location: [place];  Service: Open Heart Surgery;  Laterality: N/A;   CORONARY ATHERECTOMY N/A 02/10/2020   Procedure: CORONARY ATHERECTOMY;  Surgeon: [name], MD;  Location: [place] CV LAB;  Service: Cardiovascular;  Laterality: N/A;   CORONARY BALLOON ANGIOPLASTY N/A 06/10/2020   Procedure: CORONARY BALLOON ANGIOPLASTY;  Surgeon: Wellington Hampshire, MD;  Location: [place] CV LAB;  Service: Cardiovascular;  Laterality: N/A;   CORONARY STENT INTERVENTION N/A 02/10/2020   Procedure: CORONARY STENT INTERVENTION;  Surgeon: [name], MD;  Location: [place] CV LAB;  Service: Cardiovascular;  Laterality: N/A;   HERNIA REPAIR     LEFT HEART CATH AND CORS/GRAFTS ANGIOGRAPHY N/A 07/11/2017   Procedure: LEFT HEART CATH AND CORS/GRAFTS ANGIOGRAPHY;  Surgeon: Troy Sine, MD;  Location: [place] CV LAB;  Service: Cardiovascular;  Laterality: N/A;   LEFT HEART CATH AND CORS/GRAFTS ANGIOGRAPHY N/A 09/12/2018   Procedure: LEFT HEART CATH AND CORS/GRAFTS ANGIOGRAPHY;  Surgeon: Troy Sine, MD;  Location: [place] CV LAB;  Service: Cardiovascular;  Laterality: N/A;   LEFT HEART CATH AND CORS/GRAFTS ANGIOGRAPHY N/A 02/10/2020   Procedure: LEFT HEART CATH AND CORS/GRAFTS ANGIOGRAPHY;  Surgeon: [name], MD;  Location: [place] CV LAB;  Service: Cardiovascular;  Laterality: N/A;   LEFT HEART CATH AND  CORS/GRAFTS ANGIOGRAPHY N/A 06/10/2020   Procedure: LEFT HEART CATH AND CORS/GRAFTS ANGIOGRAPHY;  Surgeon: Wellington Hampshire, MD;  Location: [place] CV LAB;  Service: Cardiovascular;  Laterality: N/A;   LEFT HEART CATHETERIZATION WITH CORONARY ANGIOGRAM N/A 06/18/2014   Procedure: LEFT HEART CATHETERIZATION WITH CORONARY ANGIOGRAM;  Surgeon: Blane Ohara, MD;  Location: [facility] CATH LAB;  Service: Cardiovascular;  Laterality: N/A;   RADIAL ARTERY  HARVEST Left 08/06/2017   Procedure: RADIAL ARTERY HARVEST;  Surgeon: Gaye Pollack, MD;  Location: [place];  Service: Open Heart Surgery;  Laterality: Left;   TEE WITHOUT CARDIOVERSION N/A 08/06/2017   Procedure: TRANSESOPHAGEAL ECHOCARDIOGRAM (TEE);  Surgeon: Gaye Pollack, MD;  Location: [place];  Service: Open Heart Surgery;  Laterality: N/A;   TOTAL HIP ARTHROPLASTY Right 02/07/2016   Procedure: RIGHT TOTAL HIP ARTHROPLASTY ANTERIOR APPROACH;  Surgeon: Mcarthur Rossetti, MD;  Location: [place];  Service: Orthopedics;  Laterality: Right;   TRANSURETHRAL RESECTION OF PROSTATE      Current Outpatient Medications  Medication Sig Dispense Refill   amiodarone (PACERONE) 200 MG tablet TAKE 1/2 TABLET BY MOUTH DAILY (cut in half) 45 tablet 3   atorvastatin (LIPITOR) 80 MG tablet TAKE 1 TABLET BY MOUTH AT BEDTIME 30 tablet 5   carboxymethylcellulose (REFRESH PLUS) 0.5 % SOLN Place 1 drop into both eyes daily as needed (Dry eyes).     clopidogrel (PLAVIX) 75 MG tablet TAKE 1 TABLET BY MOUTH DAILY WITH BREAKFAST 30 tablet 5   diltiazem (CARDIZEM CD) 240 MG 24 hr capsule TAKE ONE CAPSULE BY MOUTH DAILY 90 capsule 1   ELIQUIS 5 MG TABS tablet TAKE 1 TABLET BY MOUTH TWICE DAILY 180 tablet 1   finasteride (PROSCAR) 5 MG tablet Take 5 mg by mouth at bedtime.      furosemide (LASIX) 20 MG tablet TAKE TWO TABLETS BY MOUTH DAILY 180 tablet 3   gabapentin (NEURONTIN) 300 MG capsule Take 300 mg by mouth 2 (two) times daily.     isosorbide mononitrate (IMDUR) 60 MG 24 hr tablet TAKE 1 TABLET BY MOUTH TWICE DAILY every morning AND in THE evening 60 tablet 5   losartan (COZAAR) 25 MG tablet TAKE 1 TABLET BY MOUTH DAILY 90 tablet 1   Multiple Vitamin (MULTIVITAMIN) tablet Take 1 tablet by mouth at bedtime.     nitroGLYCERIN (NITROSTAT) 0.4 MG SL tablet DISSOLVE 1 TABLET UNDER THE TONGUE EVERY 5 MINUTES AS NEEDED FOR CHEST PAIN. DO NOT EXCEED A TOTAL OF 3 DOSES IN 15 MINUTES. 25 tablet 3   Omega-3 Fatty Acids  (FISH OIL) 1000 MG CAPS Take 1,000-2,000 mg by mouth at bedtime.     tamsulosin (FLOMAX) 0.4 MG CAPS capsule Take 0.4 mg by mouth at bedtime.     triamcinolone ointment (KENALOG) 0.1 % Apply 1 application topically daily as needed (itching).     valACYclovir (VALTREX) 500 MG tablet Take 500 mg by mouth daily as needed (fever blisters).      vitamin C (ASCORBIC ACID) 500 MG tablet Take 500 mg by mouth daily.     zinc gluconate 50 MG tablet Take 50 mg by mouth at bedtime.      No current facility-administered medications for this visit.   Allergies:  Chocolate flavor   ROS: No palpitations or syncope.  Physical Exam: VS:  BP (!) 156/78   Pulse (!) 51   Ht 5\' 7"  (1.702 m)   Wt 191 lb 3.2 oz (86.7 kg)   SpO2 97%  BMI 29.95 kg/m , BMI Body mass index is 29.95 kg/m.  Wt Readings from Last 3 Encounters:  03/09/21 191 lb 3.2 oz (86.7 kg)  11/25/20 177 lb 6.4 oz (80.5 kg)  09/27/20 174 lb (78.9 kg)    General: Patient appears comfortable at rest. HEENT: Conjunctiva and lids normal, wearing a mask. Neck: Supple, no elevated JVP or carotid bruits, no thyromegaly. Lungs: Clear to auscultation, nonlabored breathing at rest. Cardiac: Regular rate and rhythm, no S3, 2/6 diastolic murmur, no pericardial rub. Extremities: Right leg prosthesis status post right AKA.  ECG:  An ECG dated 06/10/2020 was personally reviewed today and demonstrated:  Sinus bradycardia with prolonged PR interval and old inferior infarct pattern.  Recent Labwork: 06/09/2020: BUN 17; Creatinine, Ser 0.92; Hemoglobin 13.9; Platelets 233; Potassium 3.4; Sodium 137     Component Value Date/Time   CHOL 130 03/06/2017 0610   TRIG 262 (H) 03/06/2017 0610   HDL 28 (L) 03/06/2017 0610   CHOLHDL 4.6 03/06/2017 0610   VLDL 52 (H) 03/06/2017 0610   LDLCALC 50 03/06/2017 0610    Other Studies Reviewed Today:  Echocardiogram 08/20/2020: Summary    1. The left ventricle is normal in size with moderately increased wall   thickness.    2. The left ventricular systolic function is normal, LVEF is visually  estimated at > 55%.    3. There is mild aortic regurgitation.    4. The left atrium is mildly dilated in size.    5. The right ventricle is not well visualized but probably normal in size,  with normal systolic function.    6. The aortic root is mildly dilated.   Assessment and Plan:  1.  Paroxysmal atrial fibrillation with CHA2DS2-VASc score of 4.  Heart rate in the low 50s in sinus rhythm, we will plan to stop Lopressor at this time.  Otherwise continue Cardizem CD, amiodarone, and Eliquis.  2. Multivessel CAD status post CABG in 2013 with concurrent repair of ascending aortic aneurysm at [facility]. He underwent redo operation in May 2019 in the setting of graft failure with placement of radial to the diagonal, SVG to OM, and SVG to PDA and PL 2.  Since that time he has had further graft failure and revascularization of the OM and RCA distributions.  Status post DES x2 to the RCA in September 2021, and angioplasty of same region due to in-stent restenosis in January of this year.  He does not describe any active angina at this point.  He remains on Plavix, Lipitor, losartan, and Imdur.  Medication Adjustments/Labs and Tests Ordered: Current medicines are reviewed at length with the patient today.  Concerns regarding medicines are outlined above.   Tests Ordered: Orders Placed This Encounter  Procedures   EKG 12-Lead    Medication Changes: No orders of the defined types were placed in this encounter.   Disposition:  Follow up  3 months.  Signed, Satira Sark, MD, [facility] 03/09/2021 12:14 PM    [facility], [place], [place] [address] Phone: [phone]; Fax: [phone]

## 2021-03-09 NOTE — Patient Instructions (Addendum)
Medication Instructions:  Your physician has recommended you make the following change in your medication:  Stop metoprolol Continue other medications the same  Labwork: none  Testing/Procedures: none  Follow-Up: Your physician recommends that you schedule a follow-up appointment in: 3 months  Any Other Special Instructions Will Be Listed Below (If Applicable).  If you need a refill on your cardiac medications before your next appointment, please call your pharmacy.

## 2021-03-13 DIAGNOSIS — R918 Other nonspecific abnormal finding of lung field: Principal | ICD-10-CM

## 2021-03-13 DIAGNOSIS — C499 Malignant neoplasm of connective and soft tissue, unspecified: Principal | ICD-10-CM

## 2021-03-14 ENCOUNTER — Ambulatory Visit: Admit: 2021-03-14 | Payer: MEDICARE

## 2021-03-14 DIAGNOSIS — C499 Malignant neoplasm of connective and soft tissue, unspecified: Secondary | ICD-10-CM | POA: Diagnosis not present

## 2021-03-14 DIAGNOSIS — Z89611 Acquired absence of right leg above knee: Secondary | ICD-10-CM | POA: Diagnosis not present

## 2021-03-14 DIAGNOSIS — R262 Difficulty in walking, not elsewhere classified: Secondary | ICD-10-CM | POA: Diagnosis not present

## 2021-03-14 DIAGNOSIS — G546 Phantom limb syndrome with pain: Secondary | ICD-10-CM | POA: Diagnosis not present

## 2021-03-14 DIAGNOSIS — Z978 Presence of other specified devices: Secondary | ICD-10-CM | POA: Diagnosis not present

## 2021-03-15 ENCOUNTER — Ambulatory Visit: Admit: 2021-03-15 | Discharge: 2021-03-16 | Payer: MEDICARE

## 2021-03-15 DIAGNOSIS — I351 Nonrheumatic aortic (valve) insufficiency: Secondary | ICD-10-CM | POA: Diagnosis not present

## 2021-03-15 DIAGNOSIS — I517 Cardiomegaly: Secondary | ICD-10-CM | POA: Diagnosis not present

## 2021-03-15 DIAGNOSIS — C499 Malignant neoplasm of connective and soft tissue, unspecified: Secondary | ICD-10-CM | POA: Diagnosis not present

## 2021-03-15 DIAGNOSIS — I77819 Aortic ectasia, unspecified site: Secondary | ICD-10-CM | POA: Diagnosis not present

## 2021-03-15 DIAGNOSIS — I5189 Other ill-defined heart diseases: Secondary | ICD-10-CM | POA: Diagnosis not present

## 2021-03-16 DIAGNOSIS — Z978 Presence of other specified devices: Secondary | ICD-10-CM | POA: Diagnosis not present

## 2021-03-16 DIAGNOSIS — G546 Phantom limb syndrome with pain: Secondary | ICD-10-CM | POA: Diagnosis not present

## 2021-03-16 DIAGNOSIS — Z89611 Acquired absence of right leg above knee: Secondary | ICD-10-CM | POA: Diagnosis not present

## 2021-03-16 DIAGNOSIS — R262 Difficulty in walking, not elsewhere classified: Principal | ICD-10-CM

## 2021-03-16 DIAGNOSIS — R2681 Unsteadiness on feet: Principal | ICD-10-CM

## 2021-03-16 DIAGNOSIS — C499 Malignant neoplasm of connective and soft tissue, unspecified: Secondary | ICD-10-CM | POA: Diagnosis not present

## 2021-03-20 DIAGNOSIS — C499 Malignant neoplasm of connective and soft tissue, unspecified: Principal | ICD-10-CM

## 2021-03-20 DIAGNOSIS — R918 Other nonspecific abnormal finding of lung field: Principal | ICD-10-CM

## 2021-03-20 DIAGNOSIS — Z1159 Encounter for screening for other viral diseases: Principal | ICD-10-CM

## 2021-03-21 DIAGNOSIS — Z978 Presence of other specified devices: Secondary | ICD-10-CM | POA: Diagnosis not present

## 2021-03-21 DIAGNOSIS — R262 Difficulty in walking, not elsewhere classified: Secondary | ICD-10-CM | POA: Diagnosis not present

## 2021-03-21 DIAGNOSIS — Z89611 Acquired absence of right leg above knee: Secondary | ICD-10-CM | POA: Diagnosis not present

## 2021-03-21 DIAGNOSIS — C499 Malignant neoplasm of connective and soft tissue, unspecified: Secondary | ICD-10-CM | POA: Diagnosis not present

## 2021-03-21 DIAGNOSIS — R2681 Unsteadiness on feet: Principal | ICD-10-CM

## 2021-03-21 DIAGNOSIS — G546 Phantom limb syndrome with pain: Secondary | ICD-10-CM | POA: Diagnosis not present

## 2021-03-22 DIAGNOSIS — C499 Malignant neoplasm of connective and soft tissue, unspecified: Secondary | ICD-10-CM | POA: Diagnosis not present

## 2021-03-22 DIAGNOSIS — R918 Other nonspecific abnormal finding of lung field: Secondary | ICD-10-CM | POA: Diagnosis not present

## 2021-03-23 DIAGNOSIS — Z89611 Acquired absence of right leg above knee: Secondary | ICD-10-CM | POA: Diagnosis not present

## 2021-03-23 DIAGNOSIS — R262 Difficulty in walking, not elsewhere classified: Secondary | ICD-10-CM | POA: Diagnosis not present

## 2021-03-23 DIAGNOSIS — C499 Malignant neoplasm of connective and soft tissue, unspecified: Secondary | ICD-10-CM | POA: Diagnosis not present

## 2021-03-23 DIAGNOSIS — R2681 Unsteadiness on feet: Principal | ICD-10-CM

## 2021-03-23 DIAGNOSIS — G546 Phantom limb syndrome with pain: Secondary | ICD-10-CM | POA: Diagnosis not present

## 2021-03-23 DIAGNOSIS — Z978 Presence of other specified devices: Secondary | ICD-10-CM | POA: Diagnosis not present

## 2021-03-27 ENCOUNTER — Ambulatory Visit: Admit: 2021-03-27 | Discharge: 2021-03-27 | Payer: MEDICARE

## 2021-03-27 ENCOUNTER — Encounter: Admit: 2021-03-27 | Discharge: 2021-03-27 | Payer: MEDICARE

## 2021-03-27 DIAGNOSIS — C499 Malignant neoplasm of connective and soft tissue, unspecified: Secondary | ICD-10-CM | POA: Diagnosis not present

## 2021-03-27 DIAGNOSIS — Z1159 Encounter for screening for other viral diseases: Secondary | ICD-10-CM | POA: Diagnosis not present

## 2021-03-27 DIAGNOSIS — R918 Other nonspecific abnormal finding of lung field: Secondary | ICD-10-CM | POA: Diagnosis not present

## 2021-03-27 MED ORDER — PROCHLORPERAZINE MALEATE 10 MG TABLET
ORAL_TABLET | Freq: Four times a day (QID) | ORAL | 2 refills | 8.00000 days | Status: CP | PRN
Start: 2021-03-27 — End: ?

## 2021-03-28 ENCOUNTER — Telehealth: Admit: 2021-03-28 | Discharge: 2021-03-28 | Payer: MEDICARE

## 2021-03-28 ENCOUNTER — Ambulatory Visit: Admit: 2021-03-28 | Discharge: 2021-03-28 | Payer: MEDICARE

## 2021-03-28 DIAGNOSIS — Z95828 Presence of other vascular implants and grafts: Secondary | ICD-10-CM | POA: Diagnosis not present

## 2021-03-28 DIAGNOSIS — I2510 Atherosclerotic heart disease of native coronary artery without angina pectoris: Secondary | ICD-10-CM | POA: Diagnosis not present

## 2021-03-28 DIAGNOSIS — Z951 Presence of aortocoronary bypass graft: Secondary | ICD-10-CM | POA: Diagnosis not present

## 2021-03-28 DIAGNOSIS — R918 Other nonspecific abnormal finding of lung field: Secondary | ICD-10-CM | POA: Diagnosis not present

## 2021-03-28 DIAGNOSIS — Z5111 Encounter for antineoplastic chemotherapy: Secondary | ICD-10-CM | POA: Diagnosis not present

## 2021-03-28 DIAGNOSIS — Z1159 Encounter for screening for other viral diseases: Principal | ICD-10-CM

## 2021-03-28 DIAGNOSIS — C499 Malignant neoplasm of connective and soft tissue, unspecified: Principal | ICD-10-CM

## 2021-03-28 DIAGNOSIS — G893 Neoplasm related pain (acute) (chronic): Secondary | ICD-10-CM | POA: Diagnosis not present

## 2021-03-28 MED ORDER — ONDANSETRON HCL 8 MG TABLET
ORAL_TABLET | Freq: Three times a day (TID) | ORAL | 3 refills | 20 days | Status: CP | PRN
Start: 2021-03-28 — End: 2021-04-27

## 2021-03-29 ENCOUNTER — Ambulatory Visit: Admit: 2021-03-29 | Discharge: 2021-03-30 | Payer: MEDICARE

## 2021-03-29 DIAGNOSIS — Z5111 Encounter for antineoplastic chemotherapy: Secondary | ICD-10-CM | POA: Diagnosis not present

## 2021-03-29 DIAGNOSIS — C499 Malignant neoplasm of connective and soft tissue, unspecified: Secondary | ICD-10-CM | POA: Diagnosis not present

## 2021-03-29 DIAGNOSIS — R918 Other nonspecific abnormal finding of lung field: Principal | ICD-10-CM

## 2021-03-29 DIAGNOSIS — Z1159 Encounter for screening for other viral diseases: Principal | ICD-10-CM

## 2021-03-30 ENCOUNTER — Other Ambulatory Visit: Payer: Self-pay | Admitting: Cardiology

## 2021-04-02 DIAGNOSIS — C499 Malignant neoplasm of connective and soft tissue, unspecified: Secondary | ICD-10-CM | POA: Diagnosis not present

## 2021-04-02 DIAGNOSIS — R918 Other nonspecific abnormal finding of lung field: Secondary | ICD-10-CM | POA: Diagnosis not present

## 2021-04-10 ENCOUNTER — Ambulatory Visit: Admit: 2021-04-10 | Discharge: 2021-04-11 | Payer: MEDICARE

## 2021-04-24 ENCOUNTER — Ambulatory Visit: Admit: 2021-04-24 | Discharge: 2021-04-24 | Payer: MEDICARE

## 2021-04-24 ENCOUNTER — Encounter: Admit: 2021-04-24 | Discharge: 2021-04-24 | Payer: MEDICARE

## 2021-04-24 DIAGNOSIS — R918 Other nonspecific abnormal finding of lung field: Secondary | ICD-10-CM | POA: Diagnosis not present

## 2021-04-24 DIAGNOSIS — C499 Malignant neoplasm of connective and soft tissue, unspecified: Principal | ICD-10-CM

## 2021-04-24 DIAGNOSIS — Z1159 Encounter for screening for other viral diseases: Principal | ICD-10-CM

## 2021-04-25 ENCOUNTER — Ambulatory Visit: Admit: 2021-04-25 | Discharge: 2021-04-26 | Payer: MEDICARE

## 2021-04-25 ENCOUNTER — Telehealth
Admit: 2021-04-25 | Discharge: 2021-04-26 | Payer: MEDICARE | Attending: Student in an Organized Health Care Education/Training Program | Primary: Student in an Organized Health Care Education/Training Program

## 2021-04-25 DIAGNOSIS — I10 Essential (primary) hypertension: Secondary | ICD-10-CM | POA: Diagnosis not present

## 2021-04-25 DIAGNOSIS — C7989 Secondary malignant neoplasm of other specified sites: Secondary | ICD-10-CM | POA: Diagnosis not present

## 2021-04-25 DIAGNOSIS — Z951 Presence of aortocoronary bypass graft: Secondary | ICD-10-CM | POA: Diagnosis not present

## 2021-04-25 DIAGNOSIS — M79609 Pain in unspecified limb: Secondary | ICD-10-CM | POA: Diagnosis not present

## 2021-04-25 DIAGNOSIS — I2510 Atherosclerotic heart disease of native coronary artery without angina pectoris: Secondary | ICD-10-CM | POA: Diagnosis not present

## 2021-04-25 DIAGNOSIS — Z602 Problems related to living alone: Secondary | ICD-10-CM | POA: Diagnosis not present

## 2021-04-25 DIAGNOSIS — I712 Thoracic aortic aneurysm, without rupture, unspecified: Secondary | ICD-10-CM | POA: Diagnosis not present

## 2021-04-25 DIAGNOSIS — Z1159 Encounter for screening for other viral diseases: Secondary | ICD-10-CM | POA: Diagnosis not present

## 2021-04-25 DIAGNOSIS — Z5111 Encounter for antineoplastic chemotherapy: Secondary | ICD-10-CM | POA: Diagnosis not present

## 2021-04-25 DIAGNOSIS — R918 Other nonspecific abnormal finding of lung field: Principal | ICD-10-CM

## 2021-04-25 DIAGNOSIS — C499 Malignant neoplasm of connective and soft tissue, unspecified: Principal | ICD-10-CM

## 2021-04-25 DIAGNOSIS — C7800 Secondary malignant neoplasm of unspecified lung: Secondary | ICD-10-CM | POA: Diagnosis not present

## 2021-04-26 ENCOUNTER — Ambulatory Visit: Admit: 2021-04-26 | Discharge: 2021-04-27 | Payer: MEDICARE

## 2021-05-05 ENCOUNTER — Ambulatory Visit: Admit: 2021-05-05 | Discharge: 2021-05-06 | Payer: MEDICARE

## 2021-05-05 DIAGNOSIS — C7802 Secondary malignant neoplasm of left lung: Secondary | ICD-10-CM | POA: Diagnosis not present

## 2021-05-05 DIAGNOSIS — C7801 Secondary malignant neoplasm of right lung: Secondary | ICD-10-CM | POA: Diagnosis not present

## 2021-05-05 DIAGNOSIS — C4921 Malignant neoplasm of connective and soft tissue of right lower limb, including hip: Secondary | ICD-10-CM | POA: Diagnosis not present

## 2021-05-05 DIAGNOSIS — C499 Malignant neoplasm of connective and soft tissue, unspecified: Secondary | ICD-10-CM | POA: Diagnosis not present

## 2021-05-05 DIAGNOSIS — C7989 Secondary malignant neoplasm of other specified sites: Secondary | ICD-10-CM | POA: Diagnosis not present

## 2021-05-06 DIAGNOSIS — B001 Herpesviral vesicular dermatitis: Principal | ICD-10-CM

## 2021-05-09 DIAGNOSIS — R350 Frequency of micturition: Secondary | ICD-10-CM | POA: Diagnosis not present

## 2021-05-09 DIAGNOSIS — N401 Enlarged prostate with lower urinary tract symptoms: Secondary | ICD-10-CM | POA: Diagnosis not present

## 2021-05-09 DIAGNOSIS — E782 Mixed hyperlipidemia: Secondary | ICD-10-CM | POA: Diagnosis not present

## 2021-05-09 DIAGNOSIS — B001 Herpesviral vesicular dermatitis: Principal | ICD-10-CM

## 2021-05-09 DIAGNOSIS — R7301 Impaired fasting glucose: Secondary | ICD-10-CM | POA: Diagnosis not present

## 2021-05-10 MED ORDER — VALACYCLOVIR 500 MG TABLET
ORAL_TABLET | ORAL | 5 refills | 0.00000 days | Status: CP
Start: 2021-05-10 — End: ?

## 2021-05-17 ENCOUNTER — Ambulatory Visit: Admit: 2021-05-17 | Discharge: 2021-05-18 | Payer: MEDICARE

## 2021-05-23 DIAGNOSIS — U071 COVID-19: Secondary | ICD-10-CM | POA: Diagnosis not present

## 2021-05-23 DIAGNOSIS — J069 Acute upper respiratory infection, unspecified: Secondary | ICD-10-CM | POA: Diagnosis not present

## 2021-05-26 ENCOUNTER — Encounter: Admit: 2021-05-26 | Discharge: 2021-05-26 | Payer: MEDICARE

## 2021-05-26 ENCOUNTER — Ambulatory Visit: Admit: 2021-05-26 | Discharge: 2021-05-26 | Payer: MEDICARE

## 2021-05-26 DIAGNOSIS — C499 Malignant neoplasm of connective and soft tissue, unspecified: Secondary | ICD-10-CM | POA: Diagnosis not present

## 2021-05-26 DIAGNOSIS — R918 Other nonspecific abnormal finding of lung field: Principal | ICD-10-CM

## 2021-05-29 ENCOUNTER — Other Ambulatory Visit: Payer: Self-pay | Admitting: Cardiology

## 2021-05-30 ENCOUNTER — Ambulatory Visit: Admit: 2021-05-30 | Discharge: 2021-05-30 | Payer: MEDICARE

## 2021-05-30 ENCOUNTER — Telehealth: Admit: 2021-05-30 | Discharge: 2021-05-30 | Payer: MEDICARE

## 2021-05-30 DIAGNOSIS — Z951 Presence of aortocoronary bypass graft: Secondary | ICD-10-CM | POA: Diagnosis not present

## 2021-05-30 DIAGNOSIS — I209 Angina pectoris, unspecified: Secondary | ICD-10-CM | POA: Diagnosis not present

## 2021-05-30 DIAGNOSIS — C499 Malignant neoplasm of connective and soft tissue, unspecified: Secondary | ICD-10-CM | POA: Diagnosis not present

## 2021-05-30 DIAGNOSIS — I712 Thoracic aortic aneurysm without rupture, unspecified part: Principal | ICD-10-CM

## 2021-05-30 DIAGNOSIS — I2510 Atherosclerotic heart disease of native coronary artery without angina pectoris: Secondary | ICD-10-CM | POA: Diagnosis not present

## 2021-05-30 DIAGNOSIS — I10 Essential (primary) hypertension: Principal | ICD-10-CM

## 2021-05-30 DIAGNOSIS — Z1159 Encounter for screening for other viral diseases: Principal | ICD-10-CM

## 2021-05-30 DIAGNOSIS — R918 Other nonspecific abnormal finding of lung field: Principal | ICD-10-CM

## 2021-05-30 DIAGNOSIS — G893 Neoplasm related pain (acute) (chronic): Principal | ICD-10-CM

## 2021-05-31 DIAGNOSIS — C499 Malignant neoplasm of connective and soft tissue, unspecified: Secondary | ICD-10-CM | POA: Diagnosis not present

## 2021-05-31 DIAGNOSIS — Z89611 Acquired absence of right leg above knee: Secondary | ICD-10-CM | POA: Diagnosis not present

## 2021-05-31 DIAGNOSIS — R7301 Impaired fasting glucose: Secondary | ICD-10-CM | POA: Diagnosis not present

## 2021-05-31 DIAGNOSIS — I208 Other forms of angina pectoris: Secondary | ICD-10-CM | POA: Diagnosis not present

## 2021-06-01 ENCOUNTER — Ambulatory Visit: Admit: 2021-06-01 | Discharge: 2021-06-01 | Payer: MEDICARE

## 2021-06-01 ENCOUNTER — Encounter: Admit: 2021-06-01 | Discharge: 2021-06-01 | Payer: MEDICARE

## 2021-06-01 DIAGNOSIS — Z1159 Encounter for screening for other viral diseases: Secondary | ICD-10-CM | POA: Diagnosis not present

## 2021-06-01 DIAGNOSIS — C499 Malignant neoplasm of connective and soft tissue, unspecified: Secondary | ICD-10-CM | POA: Diagnosis not present

## 2021-06-01 DIAGNOSIS — R918 Other nonspecific abnormal finding of lung field: Principal | ICD-10-CM

## 2021-06-05 MED ORDER — DEXAMETHASONE 4 MG TABLET
ORAL_TABLET | Freq: Two times a day (BID) | ORAL | 1 refills | 9 days | Status: CP
Start: 2021-06-05 — End: ?

## 2021-06-06 ENCOUNTER — Ambulatory Visit: Admit: 2021-06-06 | Discharge: 2021-06-07 | Payer: MEDICARE

## 2021-06-06 DIAGNOSIS — Z5111 Encounter for antineoplastic chemotherapy: Secondary | ICD-10-CM | POA: Diagnosis not present

## 2021-06-06 DIAGNOSIS — R918 Other nonspecific abnormal finding of lung field: Secondary | ICD-10-CM | POA: Diagnosis not present

## 2021-06-06 DIAGNOSIS — C499 Malignant neoplasm of connective and soft tissue, unspecified: Secondary | ICD-10-CM | POA: Diagnosis not present

## 2021-06-06 DIAGNOSIS — Z1159 Encounter for screening for other viral diseases: Principal | ICD-10-CM

## 2021-06-07 ENCOUNTER — Ambulatory Visit: Admit: 2021-06-07 | Discharge: 2021-06-08 | Payer: MEDICARE

## 2021-06-08 DIAGNOSIS — C499 Malignant neoplasm of connective and soft tissue, unspecified: Principal | ICD-10-CM

## 2021-06-12 ENCOUNTER — Ambulatory Visit: Admit: 2021-06-12 | Discharge: 2021-06-12 | Payer: MEDICARE

## 2021-06-12 ENCOUNTER — Encounter: Admit: 2021-06-12 | Discharge: 2021-06-12 | Payer: MEDICARE

## 2021-06-12 DIAGNOSIS — C499 Malignant neoplasm of connective and soft tissue, unspecified: Secondary | ICD-10-CM | POA: Diagnosis not present

## 2021-06-12 DIAGNOSIS — R918 Other nonspecific abnormal finding of lung field: Secondary | ICD-10-CM | POA: Diagnosis not present

## 2021-06-12 DIAGNOSIS — Z1159 Encounter for screening for other viral diseases: Secondary | ICD-10-CM | POA: Diagnosis not present

## 2021-06-13 ENCOUNTER — Encounter: Payer: Self-pay | Admitting: Cardiology

## 2021-06-13 ENCOUNTER — Ambulatory Visit (INDEPENDENT_AMBULATORY_CARE_PROVIDER_SITE_OTHER): Payer: Medicare Other | Admitting: Cardiology

## 2021-06-13 ENCOUNTER — Ambulatory Visit: Admit: 2021-06-13 | Discharge: 2021-06-13 | Payer: MEDICARE

## 2021-06-13 VITALS — BP 158/80 | HR 71 | Ht 67.0 in | Wt 191.0 lb

## 2021-06-13 DIAGNOSIS — R918 Other nonspecific abnormal finding of lung field: Secondary | ICD-10-CM | POA: Diagnosis not present

## 2021-06-13 DIAGNOSIS — Z1159 Encounter for screening for other viral diseases: Secondary | ICD-10-CM | POA: Diagnosis not present

## 2021-06-13 DIAGNOSIS — I10 Essential (primary) hypertension: Secondary | ICD-10-CM | POA: Diagnosis not present

## 2021-06-13 DIAGNOSIS — I480 Paroxysmal atrial fibrillation: Secondary | ICD-10-CM

## 2021-06-13 DIAGNOSIS — I25119 Atherosclerotic heart disease of native coronary artery with unspecified angina pectoris: Secondary | ICD-10-CM

## 2021-06-13 DIAGNOSIS — Z5111 Encounter for antineoplastic chemotherapy: Secondary | ICD-10-CM | POA: Diagnosis not present

## 2021-06-13 DIAGNOSIS — C499 Malignant neoplasm of connective and soft tissue, unspecified: Secondary | ICD-10-CM | POA: Diagnosis not present

## 2021-06-13 MED ORDER — LOSARTAN POTASSIUM 50 MG PO TABS: 50.0000 mg | ORAL_TABLET | Freq: Every day | ORAL | 1 refills | Status: DC

## 2021-06-13 NOTE — Patient Instructions (Addendum)
Medication Instructions:  Your physician has recommended you make the following change in your medication:  Increase losartan to 50 mg daily Continue other medications the same  Labwork: none  Testing/Procedures: Your physician has requested that you have an echocardiogram in 3 months just before your next visit Echocardiography is a painless test that uses sound waves to create images of your heart. It provides your doctor with information about the size and shape of your heart and how well your hearts chambers and valves are working. This procedure takes approximately one hour. There are no restrictions for this procedure.  Follow-Up: Your physician recommends that you schedule a follow-up appointment in: 3 months  Any Other Special Instructions Will Be Listed Below (If Applicable).  If you need a refill on your cardiac medications before your next appointment, please call your pharmacy.

## 2021-06-13 NOTE — Progress Notes (Signed)
Cardiology Office Note  Date: 06/13/2021   ID: Eric Borer., DOB 09/16/1940, MRN [id number]  PCP:  Celene Squibb, MD  Cardiologist:  Rozann Lesches, MD Electrophysiologist:  None   Chief Complaint  Patient presents with   Cardiac follow-up    History of Present Illness: Eric Beltran. is a 81 y.o. male last seen in October 2022.  He is here for a routine visit.  Reports no active angina at this time on current medications.  Blood pressure has been trending up somewhat.  He reports compliance with therapy.  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  His chemotherapy has been recently adjusted.  I personally reviewed his ECG today which shows sinus rhythm with prolonged PR interval, incomplete right bundle branch block, nonspecific T wave changes.  We went over his medications which are outlined below and discussed increasing Cozaar to 50 mg daily.  Past Medical History:  Diagnosis Date   Arthritis    Ascending aortic aneurysm    Status post repair 12/2011 at [facility]   BPH (benign prostatic hyperplasia)    Chronic diastolic CHF (congestive heart failure) (HCC)    Coronary atherosclerosis of native coronary artery    a. Multivessel status post prior stenting and ultimately CABG 12/2011 at [facility] with with LIMA to LAD, SVG to PLB, SVG to ramus, SVG to OM . b. Last cath 06/2014 (following ischemic nuc) -> medical therapy, no feasible way to revascularize LCx territory.   Essential hypertension    Mixed hyperlipidemia    Myocardial infarction [facility])    PAD (peripheral artery disease) (HCC)    PAF (paroxysmal atrial fibrillation) ([place])    Supraventricular tachycardia ([place])     Past Surgical History:  Procedure Laterality Date   ASCENDING AORTIC ANEURYSM REPAIR  12/2011 [facility]   26 mm.Dacron graft   CORONARY ARTERY BYPASS  GRAFT  12/2011 [facility]   LIMA to LAD, SVG to PLB, SVG to ramus, SVG to OM   CORONARY ARTERY BYPASS GRAFT N/A 08/06/2017   Procedure: REDO CORONARY ARTERY BYPASS GRAFTING (CABG) TIMES FOUR UTILIZING LEFT GREATER SAPHENOUS VEIN HAVESTED ENDOVASCULARLY, LEFT RADIAL ARTERY.  RIGHT AXILLARY CANNULATION;  Surgeon: Gaye Pollack, MD;  Location: [place];  Service: Open Heart Surgery;  Laterality: N/A;   CORONARY ATHERECTOMY N/A 02/10/2020   Procedure: CORONARY ATHERECTOMY;  Surgeon: [name], MD;  Location: [place] CV LAB;  Service: Cardiovascular;  Laterality: N/A;   CORONARY BALLOON ANGIOPLASTY N/A 06/10/2020   Procedure: CORONARY BALLOON ANGIOPLASTY;  Surgeon: Wellington Hampshire, MD;  Location: [place] CV LAB;  Service: Cardiovascular;  Laterality: N/A;   CORONARY STENT INTERVENTION N/A 02/10/2020   Procedure: CORONARY STENT INTERVENTION;  Surgeon: [name], MD;  Location: [place] CV LAB;  Service: Cardiovascular;  Laterality: N/A;   HERNIA REPAIR     LEFT HEART CATH AND CORS/GRAFTS ANGIOGRAPHY N/A 07/11/2017   Procedure: LEFT HEART CATH AND CORS/GRAFTS ANGIOGRAPHY;  Surgeon: Troy Sine, MD;  Location: [place] CV LAB;  Service: Cardiovascular;  Laterality: N/A;   LEFT HEART CATH AND CORS/GRAFTS ANGIOGRAPHY N/A 09/12/2018   Procedure: LEFT HEART CATH AND CORS/GRAFTS ANGIOGRAPHY;  Surgeon: Troy Sine, MD;  Location: [place] CV LAB;  Service: Cardiovascular;  Laterality: N/A;   LEFT HEART CATH AND CORS/GRAFTS ANGIOGRAPHY N/A 02/10/2020   Procedure: LEFT HEART CATH AND CORS/GRAFTS ANGIOGRAPHY;  Surgeon: [name], MD;  Location: [place] CV LAB;  Service: Cardiovascular;  Laterality: N/A;   LEFT HEART CATH AND CORS/GRAFTS ANGIOGRAPHY N/A 06/10/2020   Procedure: LEFT HEART CATH AND CORS/GRAFTS ANGIOGRAPHY;  Surgeon: Wellington Hampshire, MD;  Location: [place] CV LAB;  Service: Cardiovascular;  Laterality: N/A;   LEFT HEART CATHETERIZATION WITH CORONARY ANGIOGRAM  N/A 06/18/2014   Procedure: LEFT HEART CATHETERIZATION WITH CORONARY ANGIOGRAM;  Surgeon: Blane Ohara, MD;  Location: [facility] CATH LAB;  Service: Cardiovascular;  Laterality: N/A;   RADIAL ARTERY HARVEST Left 08/06/2017   Procedure: RADIAL ARTERY HARVEST;  Surgeon: Gaye Pollack, MD;  Location: [place];  Service: Open Heart Surgery;  Laterality: Left;   TEE WITHOUT CARDIOVERSION N/A 08/06/2017   Procedure: TRANSESOPHAGEAL ECHOCARDIOGRAM (TEE);  Surgeon: Gaye Pollack, MD;  Location: [place];  Service: Open Heart Surgery;  Laterality: N/A;   TOTAL HIP ARTHROPLASTY Right 02/07/2016   Procedure: RIGHT TOTAL HIP ARTHROPLASTY ANTERIOR APPROACH;  Surgeon: Mcarthur Rossetti, MD;  Location: [place];  Service: Orthopedics;  Laterality: Right;   TRANSURETHRAL RESECTION OF PROSTATE      Current Outpatient Medications  Medication Sig Dispense Refill   amiodarone (PACERONE) 200 MG tablet TAKE 1/2 TABLET BY MOUTH DAILY (cut in half) 45 tablet 3   atorvastatin (LIPITOR) 80 MG tablet TAKE 1 TABLET BY MOUTH AT BEDTIME 30 tablet 5   carboxymethylcellulose (REFRESH PLUS) 0.5 % SOLN Place 1 drop into both eyes daily as needed (Dry eyes).     clopidogrel (PLAVIX) 75 MG tablet TAKE 1 TABLET BY MOUTH DAILY WITH BREAKFAST 30 tablet 5   diltiazem (CARDIZEM CD) 240 MG 24 hr capsule TAKE ONE CAPSULE BY MOUTH DAILY 90 capsule 2   ELIQUIS 5 MG TABS tablet TAKE 1 TABLET BY MOUTH TWICE DAILY 180 tablet 1   finasteride (PROSCAR) 5 MG tablet Take 5 mg by mouth at bedtime.      furosemide (LASIX) 20 MG tablet TAKE TWO TABLETS BY MOUTH DAILY 180 tablet 3   isosorbide mononitrate (IMDUR) 60 MG 24 hr tablet TAKE 1 TABLET BY MOUTH TWICE DAILY every morning AND in THE evening 60 tablet 5   losartan (COZAAR) 50 MG tablet Take 1 tablet (50 mg total) by mouth daily. 90 tablet 1   Multiple Vitamin (MULTIVITAMIN) tablet Take 1 tablet by mouth at bedtime.     nitroGLYCERIN (NITROSTAT) 0.4 MG SL tablet DISSOLVE 1 TABLET UNDER THE TONGUE  EVERY 5 MINUTES AS NEEDED FOR CHEST PAIN. DO NOT EXCEED A TOTAL OF 3 DOSES IN 15 MINUTES. 25 tablet 3   Omega-3 Fatty Acids (FISH OIL) 1000 MG CAPS Take 1,000-2,000 mg by mouth. occasionally     ondansetron (ZOFRAN) 8 MG tablet ondansetron HCl 8 mg tablet  TAKE 1 TABLET BY MOUTH EVERY 8 HOURS AS NEEDED FOR FOR NAUSEA AND VOMITING     prochlorperazine (COMPAZINE) 10 MG tablet prochlorperazine maleate 10 mg tablet  TAKE 1 TABLET BY MOUTH EVERY 6 HOURS AS NEEDED FOR nausea AND OR vomiting     tamsulosin (FLOMAX) 0.4 MG CAPS capsule Take 0.4 mg by mouth at bedtime.     valACYclovir (VALTREX) 500 MG tablet Take 500 mg by mouth daily as needed (fever blisters).      vitamin C (ASCORBIC ACID) 500 MG tablet Take 500 mg by mouth daily.     zinc gluconate 50 MG tablet Take 50 mg by mouth at bedtime.      gabapentin (NEURONTIN) 300 MG capsule  Take 300 mg by mouth 2 (two) times daily. (Patient not taking: Reported on 06/13/2021)     No current facility-administered medications for this visit.   Allergies:  Chocolate flavor   ROS: No palpitations or syncope.  Physical Exam: VS:  BP (!) 158/80    Pulse 71    Ht 5\' 7"  (1.702 m)    Wt 191 lb (86.6 kg)    SpO2 95%    BMI 29.91 kg/m , BMI Body mass index is 29.91 kg/m.  Wt Readings from Last 3 Encounters:  06/13/21 191 lb (86.6 kg)  03/09/21 191 lb 3.2 oz (86.7 kg)  11/25/20 177 lb 6.4 oz (80.5 kg)    General: Patient appears comfortable at rest. HEENT: Conjunctiva and lids normal, wearing a mask. Neck: Supple, no elevated JVP or carotid bruits, no thyromegaly. Lungs: Clear to auscultation, nonlabored breathing at rest. Cardiac: Regular rate and rhythm, no S3, 9-6/2 diastolic murmur, no pericardial rub. Extremities: Right leg prosthesis in place status post right AKA.  ECG:  An ECG dated 03/09/2021 was personally reviewed today and demonstrated:  Sinus bradycardia with prolonged PR interval, old inferior infarct pattern, IVCD.  Recent  Labwork:  June 2022: Cholesterol 119, triglycerides 94, HDL 41, LDL 60 January 2023: Hemoglobin 12.7, platelets 156, potassium 4.3, BUN 15, creatinine 1.04, AST 31, ALT 70  Other Studies Reviewed Today:  Echocardiogram 08/20/2020: Summary    1. The left ventricle is normal in size with moderately increased wall  thickness.    2. The left ventricular systolic function is normal, LVEF is visually  estimated at > 55%.    3. There is mild aortic regurgitation.    4. The left atrium is mildly dilated in size.    5. The right ventricle is not well visualized but probably normal in size,  with normal systolic function.    6. The aortic root is mildly dilated.   Assessment and Plan:  1.  Multivessel CAD status post CABG in 2013 with concurrent repair of ascending aortic aneurysm at [facility]. He underwent redo operation in May 2019 in the setting of graft failure with placement of radial to the diagonal, SVG to OM, and SVG to PDA and PL 2.  Since that time he has had further graft failure and revascularization of the OM and RCA distributions.  Status post DES x2 to the RCA in September 2021, and angioplasty of same region due to in-stent restenosis in January of this year.  He is symptomatically stable at this time.  ECG reviewed.  Continue Plavix, Lipitor, Imdur, Cozaar, and Cardizem CD.  2.  Essential hypertension, blood pressure trending up.  Increase Cozaar to 50 mg daily.  3.  Paroxysmal atrial fibrillation with CHA2DS2-VASc score of 5.  He is symptomatically stable and in sinus rhythm today.  Continue amiodarone, Cardizem CD, and Eliquis.  I reviewed his recent lab work.  4.  History of aortic regurgitation, previous ascending aortic aneurysm repair.  Follow-up echocardiogram will be obtained for his next visit.  Medication Adjustments/Labs and Tests Ordered: Current medicines are reviewed at length with the patient today.  Concerns regarding medicines are outlined above.   Tests  Ordered: Orders Placed This Encounter  Procedures   EKG 12-Lead   ECHOCARDIOGRAM COMPLETE    Medication Changes: Meds ordered this encounter  Medications   losartan (COZAAR) 50 MG tablet    Sig: Take 1 tablet (50 mg total) by mouth daily.    Dispense:  90 tablet  Refill:  1    06/13/2021 dose increase    Disposition:  Follow up  3 months.  Signed, Satira Sark, MD, [facility] 06/13/2021 4:08 PM    [facility] [place], [place], [place] [address] Phone: [phone]; Fax: [phone]

## 2021-06-14 ENCOUNTER — Ambulatory Visit: Admit: 2021-06-14 | Discharge: 2021-06-14 | Payer: MEDICARE

## 2021-06-14 DIAGNOSIS — C499 Malignant neoplasm of connective and soft tissue, unspecified: Secondary | ICD-10-CM | POA: Diagnosis not present

## 2021-06-14 DIAGNOSIS — Z1159 Encounter for screening for other viral diseases: Secondary | ICD-10-CM | POA: Diagnosis not present

## 2021-06-14 DIAGNOSIS — R918 Other nonspecific abnormal finding of lung field: Principal | ICD-10-CM

## 2021-06-14 DIAGNOSIS — Z79899 Other long term (current) drug therapy: Secondary | ICD-10-CM | POA: Diagnosis not present

## 2021-06-26 ENCOUNTER — Ambulatory Visit: Admit: 2021-06-26 | Discharge: 2021-06-26 | Payer: MEDICARE

## 2021-06-26 ENCOUNTER — Encounter: Admit: 2021-06-26 | Discharge: 2021-06-26 | Payer: MEDICARE

## 2021-06-26 DIAGNOSIS — C499 Malignant neoplasm of connective and soft tissue, unspecified: Secondary | ICD-10-CM | POA: Diagnosis not present

## 2021-06-26 DIAGNOSIS — H04203 Unspecified epiphora, bilateral lacrimal glands: Secondary | ICD-10-CM | POA: Diagnosis not present

## 2021-06-26 DIAGNOSIS — R918 Other nonspecific abnormal finding of lung field: Principal | ICD-10-CM

## 2021-06-26 DIAGNOSIS — Z1159 Encounter for screening for other viral diseases: Principal | ICD-10-CM

## 2021-06-27 ENCOUNTER — Ambulatory Visit: Admit: 2021-06-27 | Discharge: 2021-06-27 | Payer: MEDICARE

## 2021-06-27 ENCOUNTER — Telehealth: Admit: 2021-06-27 | Discharge: 2021-06-27 | Payer: MEDICARE

## 2021-06-27 DIAGNOSIS — G893 Neoplasm related pain (acute) (chronic): Secondary | ICD-10-CM | POA: Diagnosis not present

## 2021-06-27 DIAGNOSIS — Z5111 Encounter for antineoplastic chemotherapy: Secondary | ICD-10-CM | POA: Diagnosis not present

## 2021-06-27 DIAGNOSIS — C7802 Secondary malignant neoplasm of left lung: Secondary | ICD-10-CM | POA: Diagnosis not present

## 2021-06-27 DIAGNOSIS — I10 Essential (primary) hypertension: Secondary | ICD-10-CM | POA: Diagnosis not present

## 2021-06-27 DIAGNOSIS — M1611 Unilateral primary osteoarthritis, right hip: Secondary | ICD-10-CM | POA: Diagnosis not present

## 2021-06-27 DIAGNOSIS — C499 Malignant neoplasm of connective and soft tissue, unspecified: Secondary | ICD-10-CM | POA: Diagnosis not present

## 2021-06-27 DIAGNOSIS — C7801 Secondary malignant neoplasm of right lung: Secondary | ICD-10-CM | POA: Diagnosis not present

## 2021-06-27 DIAGNOSIS — Z951 Presence of aortocoronary bypass graft: Secondary | ICD-10-CM | POA: Diagnosis not present

## 2021-06-27 DIAGNOSIS — I2510 Atherosclerotic heart disease of native coronary artery without angina pectoris: Principal | ICD-10-CM

## 2021-06-27 DIAGNOSIS — I712 Thoracic aortic aneurysm without rupture, unspecified part: Principal | ICD-10-CM

## 2021-06-27 DIAGNOSIS — R918 Other nonspecific abnormal finding of lung field: Principal | ICD-10-CM

## 2021-06-27 DIAGNOSIS — I209 Angina pectoris, unspecified: Principal | ICD-10-CM

## 2021-06-27 DIAGNOSIS — Z1159 Encounter for screening for other viral diseases: Principal | ICD-10-CM

## 2021-06-27 DIAGNOSIS — I25119 Atherosclerotic heart disease of native coronary artery with unspecified angina pectoris: Secondary | ICD-10-CM | POA: Diagnosis not present

## 2021-06-27 DIAGNOSIS — Z89511 Acquired absence of right leg below knee: Secondary | ICD-10-CM | POA: Diagnosis not present

## 2021-06-27 MED ORDER — OXYCODONE 5 MG TABLET
ORAL_TABLET | ORAL | 0 refills | 4 days | Status: CP | PRN
Start: 2021-06-27 — End: ?

## 2021-06-28 ENCOUNTER — Other Ambulatory Visit: Payer: Self-pay | Admitting: Cardiology

## 2021-06-28 ENCOUNTER — Ambulatory Visit: Admit: 2021-06-28 | Discharge: 2021-06-29 | Payer: MEDICARE

## 2021-06-28 DIAGNOSIS — Z89611 Acquired absence of right leg above knee: Secondary | ICD-10-CM | POA: Diagnosis not present

## 2021-06-29 ENCOUNTER — Ambulatory Visit: Admit: 2021-06-29 | Discharge: 2021-06-29 | Payer: MEDICARE

## 2021-06-29 ENCOUNTER — Ambulatory Visit: Admit: 2021-06-29 | Discharge: 2021-06-29 | Payer: MEDICARE | Attending: Children | Primary: Children

## 2021-06-29 DIAGNOSIS — C499 Malignant neoplasm of connective and soft tissue, unspecified: Secondary | ICD-10-CM | POA: Diagnosis not present

## 2021-07-03 ENCOUNTER — Ambulatory Visit: Admit: 2021-07-03 | Discharge: 2021-07-03 | Payer: MEDICARE

## 2021-07-03 ENCOUNTER — Encounter: Admit: 2021-07-03 | Discharge: 2021-07-03 | Payer: MEDICARE

## 2021-07-03 DIAGNOSIS — R918 Other nonspecific abnormal finding of lung field: Secondary | ICD-10-CM | POA: Diagnosis not present

## 2021-07-03 DIAGNOSIS — Z1159 Encounter for screening for other viral diseases: Secondary | ICD-10-CM | POA: Diagnosis not present

## 2021-07-03 DIAGNOSIS — C499 Malignant neoplasm of connective and soft tissue, unspecified: Principal | ICD-10-CM

## 2021-07-04 ENCOUNTER — Ambulatory Visit: Admit: 2021-07-04 | Discharge: 2021-07-05 | Payer: MEDICARE

## 2021-07-04 DIAGNOSIS — Z5111 Encounter for antineoplastic chemotherapy: Secondary | ICD-10-CM | POA: Diagnosis not present

## 2021-07-04 DIAGNOSIS — C499 Malignant neoplasm of connective and soft tissue, unspecified: Principal | ICD-10-CM

## 2021-07-04 DIAGNOSIS — Z1159 Encounter for screening for other viral diseases: Principal | ICD-10-CM

## 2021-07-04 DIAGNOSIS — R918 Other nonspecific abnormal finding of lung field: Principal | ICD-10-CM

## 2021-07-05 ENCOUNTER — Ambulatory Visit: Admit: 2021-07-05 | Discharge: 2021-07-06 | Payer: MEDICARE

## 2021-07-05 DIAGNOSIS — Z7689 Persons encountering health services in other specified circumstances: Secondary | ICD-10-CM | POA: Diagnosis not present

## 2021-07-05 DIAGNOSIS — Z1159 Encounter for screening for other viral diseases: Principal | ICD-10-CM

## 2021-07-05 DIAGNOSIS — C499 Malignant neoplasm of connective and soft tissue, unspecified: Principal | ICD-10-CM

## 2021-07-05 DIAGNOSIS — R918 Other nonspecific abnormal finding of lung field: Principal | ICD-10-CM

## 2021-07-14 ENCOUNTER — Ambulatory Visit: Admit: 2021-07-14 | Discharge: 2021-07-15 | Payer: MEDICARE

## 2021-07-14 DIAGNOSIS — C499 Malignant neoplasm of connective and soft tissue, unspecified: Secondary | ICD-10-CM | POA: Diagnosis not present

## 2021-07-14 DIAGNOSIS — R918 Other nonspecific abnormal finding of lung field: Secondary | ICD-10-CM | POA: Diagnosis not present

## 2021-07-14 DIAGNOSIS — K7689 Other specified diseases of liver: Secondary | ICD-10-CM | POA: Diagnosis not present

## 2021-07-14 DIAGNOSIS — N289 Disorder of kidney and ureter, unspecified: Secondary | ICD-10-CM | POA: Diagnosis not present

## 2021-07-14 DIAGNOSIS — C7802 Secondary malignant neoplasm of left lung: Secondary | ICD-10-CM | POA: Diagnosis not present

## 2021-07-14 DIAGNOSIS — I7121 Aneurysm of the ascending aorta, without rupture: Secondary | ICD-10-CM | POA: Diagnosis not present

## 2021-07-17 ENCOUNTER — Ambulatory Visit: Admit: 2021-07-17 | Discharge: 2021-07-17 | Payer: MEDICARE

## 2021-07-17 ENCOUNTER — Encounter: Admit: 2021-07-17 | Discharge: 2021-07-17 | Payer: MEDICARE

## 2021-07-17 DIAGNOSIS — C499 Malignant neoplasm of connective and soft tissue, unspecified: Secondary | ICD-10-CM | POA: Diagnosis not present

## 2021-07-17 DIAGNOSIS — R918 Other nonspecific abnormal finding of lung field: Secondary | ICD-10-CM | POA: Diagnosis not present

## 2021-07-17 DIAGNOSIS — Z1159 Encounter for screening for other viral diseases: Secondary | ICD-10-CM | POA: Diagnosis not present

## 2021-07-18 ENCOUNTER — Telehealth: Admit: 2021-07-18 | Discharge: 2021-07-18 | Payer: MEDICARE

## 2021-07-18 ENCOUNTER — Ambulatory Visit: Admit: 2021-07-18 | Discharge: 2021-07-18 | Payer: MEDICARE

## 2021-07-18 DIAGNOSIS — I712 Thoracic aortic aneurysm without rupture, unspecified part: Principal | ICD-10-CM

## 2021-07-18 DIAGNOSIS — C7802 Secondary malignant neoplasm of left lung: Secondary | ICD-10-CM | POA: Diagnosis not present

## 2021-07-18 DIAGNOSIS — G893 Neoplasm related pain (acute) (chronic): Secondary | ICD-10-CM | POA: Diagnosis not present

## 2021-07-18 DIAGNOSIS — Z89619 Acquired absence of unspecified leg above knee: Secondary | ICD-10-CM | POA: Diagnosis not present

## 2021-07-18 DIAGNOSIS — R918 Other nonspecific abnormal finding of lung field: Secondary | ICD-10-CM | POA: Diagnosis not present

## 2021-07-18 DIAGNOSIS — Z96641 Presence of right artificial hip joint: Secondary | ICD-10-CM | POA: Diagnosis not present

## 2021-07-18 DIAGNOSIS — I25119 Atherosclerotic heart disease of native coronary artery with unspecified angina pectoris: Secondary | ICD-10-CM | POA: Diagnosis not present

## 2021-07-18 DIAGNOSIS — I10 Essential (primary) hypertension: Secondary | ICD-10-CM | POA: Diagnosis not present

## 2021-07-18 DIAGNOSIS — Z1159 Encounter for screening for other viral diseases: Secondary | ICD-10-CM | POA: Diagnosis not present

## 2021-07-18 DIAGNOSIS — I2510 Atherosclerotic heart disease of native coronary artery without angina pectoris: Principal | ICD-10-CM

## 2021-07-18 DIAGNOSIS — I480 Paroxysmal atrial fibrillation: Principal | ICD-10-CM

## 2021-07-18 DIAGNOSIS — I209 Angina pectoris, unspecified: Principal | ICD-10-CM

## 2021-07-18 DIAGNOSIS — C7801 Secondary malignant neoplasm of right lung: Principal | ICD-10-CM

## 2021-07-18 DIAGNOSIS — Z951 Presence of aortocoronary bypass graft: Principal | ICD-10-CM

## 2021-07-18 DIAGNOSIS — C499 Malignant neoplasm of connective and soft tissue, unspecified: Principal | ICD-10-CM

## 2021-07-18 DIAGNOSIS — Z5111 Encounter for antineoplastic chemotherapy: Secondary | ICD-10-CM | POA: Diagnosis not present

## 2021-07-18 MED ORDER — DEXAMETHASONE 4 MG TABLET
ORAL_TABLET | Freq: Two times a day (BID) | ORAL | 1 refills | 9 days | Status: CP
Start: 2021-07-18 — End: ?

## 2021-07-23 ENCOUNTER — Ambulatory Visit
Admit: 2021-07-23 | Discharge: 2021-07-25 | Disposition: A | Discharge status: Other Acute Inpatient Hospital | Payer: MEDICARE | Attending: Emergency Medicine

## 2021-07-23 ENCOUNTER — Emergency Department
Admit: 2021-07-23 | Discharge: 2021-07-25 | Disposition: A | Discharge status: Other Acute Inpatient Hospital | Payer: MEDICARE | Attending: Emergency Medicine

## 2021-07-23 DIAGNOSIS — R079 Chest pain, unspecified: Secondary | ICD-10-CM | POA: Diagnosis not present

## 2021-07-23 DIAGNOSIS — Z951 Presence of aortocoronary bypass graft: Secondary | ICD-10-CM | POA: Diagnosis not present

## 2021-07-23 DIAGNOSIS — Z20822 Contact with and (suspected) exposure to covid-19: Secondary | ICD-10-CM | POA: Diagnosis not present

## 2021-07-23 DIAGNOSIS — C499 Malignant neoplasm of connective and soft tissue, unspecified: Secondary | ICD-10-CM | POA: Diagnosis not present

## 2021-07-23 DIAGNOSIS — I440 Atrioventricular block, first degree: Secondary | ICD-10-CM | POA: Diagnosis not present

## 2021-07-23 DIAGNOSIS — Z9889 Other specified postprocedural states: Secondary | ICD-10-CM | POA: Diagnosis not present

## 2021-07-23 DIAGNOSIS — E7800 Pure hypercholesterolemia, unspecified: Secondary | ICD-10-CM | POA: Diagnosis not present

## 2021-07-23 DIAGNOSIS — I4510 Unspecified right bundle-branch block: Secondary | ICD-10-CM | POA: Diagnosis not present

## 2021-07-23 DIAGNOSIS — I25119 Atherosclerotic heart disease of native coronary artery with unspecified angina pectoris: Secondary | ICD-10-CM | POA: Diagnosis not present

## 2021-07-23 DIAGNOSIS — I700 Atherosclerosis of aorta: Secondary | ICD-10-CM | POA: Diagnosis not present

## 2021-07-23 DIAGNOSIS — I10 Essential (primary) hypertension: Secondary | ICD-10-CM | POA: Diagnosis not present

## 2021-07-23 DIAGNOSIS — R9431 Abnormal electrocardiogram [ECG] [EKG]: Secondary | ICD-10-CM | POA: Diagnosis not present

## 2021-07-23 DIAGNOSIS — J209 Acute bronchitis, unspecified: Secondary | ICD-10-CM | POA: Diagnosis not present

## 2021-07-23 MED ORDER — DOXYCYCLINE HYCLATE 100 MG CAPSULE
ORAL_CAPSULE | Freq: Two times a day (BID) | ORAL | 0 refills | 10 days | Status: CP
Start: 2021-07-23 — End: 2021-08-02

## 2021-07-24 ENCOUNTER — Telehealth: Payer: Self-pay | Admitting: Cardiology

## 2021-07-24 ENCOUNTER — Inpatient Hospital Stay (HOSPITAL_COMMUNITY)
Admission: AD | Admit: 2021-07-24 | Discharge: 2021-07-27 | DRG: 247 | Disposition: A | Discharge status: Home / Self Care | Payer: Medicare Other | Source: Other Acute Inpatient Hospital | Attending: Cardiovascular Disease | Admitting: Cardiovascular Disease

## 2021-07-24 DIAGNOSIS — R079 Chest pain, unspecified: Secondary | ICD-10-CM | POA: Diagnosis not present

## 2021-07-24 DIAGNOSIS — Z89611 Acquired absence of right leg above knee: Secondary | ICD-10-CM | POA: Diagnosis not present

## 2021-07-24 DIAGNOSIS — I739 Peripheral vascular disease, unspecified: Secondary | ICD-10-CM | POA: Diagnosis present

## 2021-07-24 DIAGNOSIS — I110 Hypertensive heart disease with heart failure: Secondary | ICD-10-CM | POA: Diagnosis not present

## 2021-07-24 DIAGNOSIS — Z20822 Contact with and (suspected) exposure to covid-19: Secondary | ICD-10-CM | POA: Diagnosis present

## 2021-07-24 DIAGNOSIS — I25810 Atherosclerosis of coronary artery bypass graft(s) without angina pectoris: Secondary | ICD-10-CM | POA: Diagnosis not present

## 2021-07-24 DIAGNOSIS — I440 Atrioventricular block, first degree: Secondary | ICD-10-CM | POA: Diagnosis not present

## 2021-07-24 DIAGNOSIS — E1151 Type 2 diabetes mellitus with diabetic peripheral angiopathy without gangrene: Secondary | ICD-10-CM | POA: Diagnosis not present

## 2021-07-24 DIAGNOSIS — Y828 Other medical devices associated with adverse incidents: Secondary | ICD-10-CM | POA: Diagnosis present

## 2021-07-24 DIAGNOSIS — I480 Paroxysmal atrial fibrillation: Secondary | ICD-10-CM | POA: Diagnosis not present

## 2021-07-24 DIAGNOSIS — E782 Mixed hyperlipidemia: Secondary | ICD-10-CM | POA: Diagnosis present

## 2021-07-24 DIAGNOSIS — I252 Old myocardial infarction: Secondary | ICD-10-CM

## 2021-07-24 DIAGNOSIS — M1990 Unspecified osteoarthritis, unspecified site: Secondary | ICD-10-CM | POA: Diagnosis not present

## 2021-07-24 DIAGNOSIS — I25119 Atherosclerotic heart disease of native coronary artery with unspecified angina pectoris: Secondary | ICD-10-CM | POA: Diagnosis present

## 2021-07-24 DIAGNOSIS — N400 Enlarged prostate without lower urinary tract symptoms: Secondary | ICD-10-CM | POA: Diagnosis not present

## 2021-07-24 DIAGNOSIS — T82855A Stenosis of coronary artery stent, initial encounter: Secondary | ICD-10-CM | POA: Diagnosis not present

## 2021-07-24 DIAGNOSIS — Z8249 Family history of ischemic heart disease and other diseases of the circulatory system: Secondary | ICD-10-CM | POA: Diagnosis not present

## 2021-07-24 DIAGNOSIS — I471 Supraventricular tachycardia: Secondary | ICD-10-CM | POA: Diagnosis not present

## 2021-07-24 DIAGNOSIS — Z955 Presence of coronary angioplasty implant and graft: Secondary | ICD-10-CM

## 2021-07-24 DIAGNOSIS — I10 Essential (primary) hypertension: Secondary | ICD-10-CM | POA: Diagnosis present

## 2021-07-24 DIAGNOSIS — I7121 Aneurysm of the ascending aorta, without rupture: Secondary | ICD-10-CM | POA: Diagnosis present

## 2021-07-24 DIAGNOSIS — Z951 Presence of aortocoronary bypass graft: Secondary | ICD-10-CM | POA: Diagnosis not present

## 2021-07-24 DIAGNOSIS — E1165 Type 2 diabetes mellitus with hyperglycemia: Secondary | ICD-10-CM | POA: Diagnosis present

## 2021-07-24 DIAGNOSIS — Z801 Family history of malignant neoplasm of trachea, bronchus and lung: Secondary | ICD-10-CM

## 2021-07-24 DIAGNOSIS — J209 Acute bronchitis, unspecified: Secondary | ICD-10-CM

## 2021-07-24 DIAGNOSIS — I5032 Chronic diastolic (congestive) heart failure: Secondary | ICD-10-CM | POA: Diagnosis not present

## 2021-07-24 DIAGNOSIS — Z7902 Long term (current) use of antithrombotics/antiplatelets: Secondary | ICD-10-CM

## 2021-07-24 DIAGNOSIS — Z96641 Presence of right artificial hip joint: Secondary | ICD-10-CM | POA: Diagnosis not present

## 2021-07-24 DIAGNOSIS — I25110 Atherosclerotic heart disease of native coronary artery with unstable angina pectoris: Principal | ICD-10-CM | POA: Diagnosis present

## 2021-07-24 DIAGNOSIS — Z820 Family history of epilepsy and other diseases of the nervous system: Secondary | ICD-10-CM | POA: Diagnosis not present

## 2021-07-24 DIAGNOSIS — Z9109 Other allergy status, other than to drugs and biological substances: Secondary | ICD-10-CM | POA: Diagnosis not present

## 2021-07-24 DIAGNOSIS — J45909 Unspecified asthma, uncomplicated: Secondary | ICD-10-CM | POA: Diagnosis present

## 2021-07-24 DIAGNOSIS — Z79899 Other long term (current) drug therapy: Secondary | ICD-10-CM

## 2021-07-24 DIAGNOSIS — Z7901 Long term (current) use of anticoagulants: Secondary | ICD-10-CM

## 2021-07-24 DIAGNOSIS — Z7982 Long term (current) use of aspirin: Secondary | ICD-10-CM

## 2021-07-24 DIAGNOSIS — I200 Unstable angina: Secondary | ICD-10-CM | POA: Diagnosis present

## 2021-07-24 MED ORDER — MAGNESIUM HYDROXIDE 400 MG/5ML PO SUSP
30.0000 mL | Freq: Every day | ORAL | Status: DC | PRN
Start: 2021-07-24 — End: 2021-07-27

## 2021-07-24 MED ORDER — ASPIRIN EC 81 MG PO TBEC
81.0000 mg | DELAYED_RELEASE_TABLET | Freq: Every day | ORAL | Status: DC
  Administered 2021-07-25 – 2021-07-27 (×3): 81 mg via ORAL
  Filled 2021-07-24 (×3): qty 1

## 2021-07-24 MED ORDER — SODIUM CHLORIDE 0.9% FLUSH: 10.0000 mL | INTRAVENOUS | Status: DC | PRN

## 2021-07-24 MED ORDER — LIDOCAINE VISCOUS HCL 2 % MT SOLN
15.0000 mL | Freq: Once | OROMUCOSAL | Status: DC
  Filled 2021-07-24: qty 15

## 2021-07-24 MED ORDER — ACETAMINOPHEN 325 MG PO TABS: 650.0000 mg | ORAL_TABLET | ORAL | Status: DC | PRN

## 2021-07-24 MED ORDER — SODIUM CHLORIDE 0.9% FLUSH
10.0000 mL | Freq: Two times a day (BID) | INTRAVENOUS | Status: DC
  Administered 2021-07-25 – 2021-07-26 (×3): 10 mL

## 2021-07-24 MED ORDER — ALPRAZOLAM 0.25 MG PO TABS: 0.2500 mg | ORAL_TABLET | Freq: Two times a day (BID) | ORAL | Status: DC | PRN

## 2021-07-24 MED ORDER — SODIUM CHLORIDE 0.9 % IV SOLN: INTRAVENOUS | Status: DC

## 2021-07-24 MED ORDER — TRAZODONE HCL 50 MG PO TABS
25.0000 mg | ORAL_TABLET | Freq: Every evening | ORAL | Status: DC | PRN
  Administered 2021-07-25 – 2021-07-26 (×2): 25 mg via ORAL
  Filled 2021-07-24 (×2): qty 1

## 2021-07-24 MED ORDER — ALUM & MAG HYDROXIDE-SIMETH 200-200-20 MG/5ML PO SUSP
30.0000 mL | Freq: Once | ORAL | Status: DC
  Filled 2021-07-24: qty 30

## 2021-07-24 MED ORDER — ONDANSETRON HCL 4 MG/2ML IJ SOLN: 4.0000 mg | Freq: Four times a day (QID) | INTRAMUSCULAR | Status: DC | PRN

## 2021-07-24 MED ORDER — CHLORHEXIDINE GLUCONATE CLOTH 2 % EX PADS
6.0000 | MEDICATED_PAD | Freq: Every day | CUTANEOUS | Status: DC
  Administered 2021-07-25 – 2021-07-27 (×2): 6 via TOPICAL

## 2021-07-24 MED ORDER — ENOXAPARIN SODIUM 40 MG/0.4ML IJ SOSY: 40.0000 mg | PREFILLED_SYRINGE | Freq: Every day | INTRAMUSCULAR | Status: DC

## 2021-07-24 NOTE — H&P (Incomplete)
[place]   PATIENT NAME: Eric Beltran    MR#:  [id number]  DATE OF BIRTH:  25-Jul-1940  DATE OF ADMISSION:  07/24/2021  PRIMARY CARE PHYSICIAN: Celene Squibb, MD   Patient is coming from: Home  REQUESTING/REFERRING PHYSICIAN: ***  CHIEF COMPLAINT:  Chest pain  HISTORY OF PRESENT ILLNESS:  Eric Bordas. is a 81 y.o. male with medical history significant for ***  ED Course: *** EKG as reviewed by me : *** Imaging: *** PAST MEDICAL HISTORY:   Past Medical History:  Diagnosis Date   Arthritis    Ascending aortic aneurysm    Status post repair 12/2011 at [facility]   BPH (benign prostatic hyperplasia)    Chronic diastolic CHF (congestive heart failure) ([place])    Coronary atherosclerosis of native coronary artery    a. Multivessel status post prior stenting and ultimately CABG 12/2011 at [facility] with with LIMA to LAD, SVG to PLB, SVG to ramus, SVG to OM . b. Last cath 06/2014 (following ischemic nuc) -> medical therapy, no feasible way to revascularize LCx territory.   Essential hypertension    Mixed hyperlipidemia    Myocardial infarction [facility])    PAD (peripheral artery disease) (HCC)    PAF (paroxysmal atrial fibrillation) (HCC)    Supraventricular tachycardia ([place])     PAST SURGICAL HISTORY:   Past Surgical History:  Procedure Laterality Date   ASCENDING AORTIC ANEURYSM REPAIR  12/2011 [facility]   26 mm.Dacron graft   CORONARY ARTERY BYPASS GRAFT  12/2011 [facility]   LIMA to LAD, SVG to PLB, SVG to ramus, SVG to OM   CORONARY ARTERY BYPASS GRAFT N/A 08/06/2017   Procedure: REDO CORONARY ARTERY BYPASS GRAFTING (CABG) TIMES FOUR UTILIZING LEFT GREATER SAPHENOUS VEIN HAVESTED ENDOVASCULARLY, LEFT RADIAL ARTERY.  RIGHT AXILLARY CANNULATION;  Surgeon: Gaye Pollack, MD;  Location: [place];  Service: Open Heart Surgery;  Laterality: N/A;   CORONARY ATHERECTOMY N/A 02/10/2020   Procedure: CORONARY ATHERECTOMY;  Surgeon: [name], MD;  Location: [place] CV LAB;   Service: Cardiovascular;  Laterality: N/A;   CORONARY BALLOON ANGIOPLASTY N/A 06/10/2020   Procedure: CORONARY BALLOON ANGIOPLASTY;  Surgeon: Wellington Hampshire, MD;  Location: [place] CV LAB;  Service: Cardiovascular;  Laterality: N/A;   CORONARY STENT INTERVENTION N/A 02/10/2020   Procedure: CORONARY STENT INTERVENTION;  Surgeon: [name], MD;  Location: [place] CV LAB;  Service: Cardiovascular;  Laterality: N/A;   HERNIA REPAIR     LEFT HEART CATH AND CORS/GRAFTS ANGIOGRAPHY N/A 07/11/2017   Procedure: LEFT HEART CATH AND CORS/GRAFTS ANGIOGRAPHY;  Surgeon: Troy Sine, MD;  Location: [place] CV LAB;  Service: Cardiovascular;  Laterality: N/A;   LEFT HEART CATH AND CORS/GRAFTS ANGIOGRAPHY N/A 09/12/2018   Procedure: LEFT HEART CATH AND CORS/GRAFTS ANGIOGRAPHY;  Surgeon: Troy Sine, MD;  Location: [place] CV LAB;  Service: Cardiovascular;  Laterality: N/A;   LEFT HEART CATH AND CORS/GRAFTS ANGIOGRAPHY N/A 02/10/2020   Procedure: LEFT HEART CATH AND CORS/GRAFTS ANGIOGRAPHY;  Surgeon: [name], MD;  Location: [place] CV LAB;  Service: Cardiovascular;  Laterality: N/A;   LEFT HEART CATH AND CORS/GRAFTS ANGIOGRAPHY N/A 06/10/2020   Procedure: LEFT HEART CATH AND CORS/GRAFTS ANGIOGRAPHY;  Surgeon: Wellington Hampshire, MD;  Location: [place] CV LAB;  Service: Cardiovascular;  Laterality: N/A;   LEFT HEART CATHETERIZATION WITH CORONARY ANGIOGRAM N/A 06/18/2014   Procedure: LEFT HEART CATHETERIZATION WITH CORONARY ANGIOGRAM;  Surgeon: Blane Ohara, MD;  Location: [place] CATH LAB;  Service: Cardiovascular;  Laterality: N/A;   RADIAL ARTERY HARVEST Left 08/06/2017   Procedure: RADIAL ARTERY HARVEST;  Surgeon: Gaye Pollack, MD;  Location: [place];  Service: Open Heart Surgery;  Laterality: Left;   TEE WITHOUT CARDIOVERSION N/A 08/06/2017   Procedure: TRANSESOPHAGEAL ECHOCARDIOGRAM (TEE);  Surgeon: Gaye Pollack, MD;  Location: [place];  Service: Open Heart Surgery;   Laterality: N/A;   TOTAL HIP ARTHROPLASTY Right 02/07/2016   Procedure: RIGHT TOTAL HIP ARTHROPLASTY ANTERIOR APPROACH;  Surgeon: Mcarthur Rossetti, MD;  Location: [place];  Service: Orthopedics;  Laterality: Right;   TRANSURETHRAL RESECTION OF PROSTATE      SOCIAL HISTORY:   Social History   Tobacco Use   Smoking status: Never   Smokeless tobacco: Never   Tobacco comments:    tobacco use - no  Substance Use Topics   Alcohol use: Yes    Alcohol/week: 0.0 standard drinks    Comment: Occasional    FAMILY HISTORY:   Family History  Problem Relation Age of Onset   Hypertension Father    Parkinson's disease Father    Hypertension Mother    Lung cancer Sister     DRUG ALLERGIES:   Allergies  Allergen Reactions   Chocolate Flavor Other (See Comments)    Stuffy nose     REVIEW OF SYSTEMS:   ROS As per history of present illness. All pertinent systems were reviewed above. Constitutional, HEENT, cardiovascular, respiratory, GI, GU, musculoskeletal, neuro, psychiatric, endocrine, integumentary and hematologic systems were reviewed and are otherwise negative/unremarkable except for positive findings mentioned above in the HPI.   MEDICATIONS AT HOME:   Prior to Admission medications   Medication Sig Start Date End Date Taking? Authorizing Provider  amiodarone (PACERONE) 200 MG tablet TAKE 1/2 TABLET BY MOUTH DAILY (cut in half) 08/31/20   Satira Sark, MD  atorvastatin (LIPITOR) 80 MG tablet TAKE 1 TABLET BY MOUTH AT BEDTIME 02/28/21   Satira Sark, MD  carboxymethylcellulose (REFRESH PLUS) 0.5 % SOLN Place 1 drop into both eyes daily as needed (Dry eyes).    [provider]  clopidogrel (PLAVIX) 75 MG tablet TAKE 1 TABLET BY MOUTH DAILY WITH BREAKFAST 06/28/21   Satira Sark, MD  diltiazem (CARDIZEM CD) 240 MG 24 hr capsule TAKE ONE CAPSULE BY MOUTH DAILY 03/30/21   Satira Sark, MD  ELIQUIS 5 MG TABS tablet TAKE 1 TABLET BY MOUTH TWICE DAILY  02/28/21   Satira Sark, MD  finasteride (PROSCAR) 5 MG tablet Take 5 mg by mouth at bedtime.  10/09/18   [provider]  furosemide (LASIX) 20 MG tablet TAKE TWO TABLETS BY MOUTH DAILY 08/10/20   Satira Sark, MD  gabapentin (NEURONTIN) 300 MG capsule Take 300 mg by mouth 2 (two) times daily. Patient not taking: Reported on 06/13/2021 11/23/20   [provider]  isosorbide mononitrate (IMDUR) 60 MG 24 hr tablet TAKE 1 TABLET BY MOUTH TWICE DAILY every morning AND in THE evening 05/29/21   Satira Sark, MD  losartan (COZAAR) 50 MG tablet Take 1 tablet (50 mg total) by mouth daily. 06/13/21   Satira Sark, MD  Multiple Vitamin (MULTIVITAMIN) tablet Take 1 tablet by mouth at bedtime.    [provider]  nitroGLYCERIN (NITROSTAT) 0.4 MG SL tablet DISSOLVE 1 TABLET UNDER THE TONGUE EVERY 5 MINUTES AS NEEDED FOR CHEST PAIN. DO NOT EXCEED A TOTAL OF 3 DOSES IN 15 MINUTES. 06/13/20  Satira Sark, MD  Omega-3 Fatty Acids (FISH OIL) 1000 MG CAPS Take 1,000-2,000 mg by mouth. occasionally    [provider]  ondansetron (ZOFRAN) 8 MG tablet ondansetron HCl 8 mg tablet  TAKE 1 TABLET BY MOUTH EVERY 8 HOURS AS NEEDED FOR FOR NAUSEA AND VOMITING    [provider]  prochlorperazine (COMPAZINE) 10 MG tablet prochlorperazine maleate 10 mg tablet  TAKE 1 TABLET BY MOUTH EVERY 6 HOURS AS NEEDED FOR nausea AND OR vomiting 03/27/21   [provider]  tamsulosin (FLOMAX) 0.4 MG CAPS capsule Take 0.4 mg by mouth at bedtime.    [provider]  valACYclovir (VALTREX) 500 MG tablet Take 500 mg by mouth daily as needed (fever blisters).  01/21/20   [provider]  vitamin C (ASCORBIC ACID) 500 MG tablet Take 500 mg by mouth daily.    [provider]  zinc gluconate 50 MG tablet Take 50 mg by mouth at bedtime.     [provider]      VITAL SIGNS:  There were no vitals taken for this visit.  PHYSICAL  EXAMINATION:  Physical Exam  GENERAL:  81 y.o.-year-old patient lying in the bed with no acute distress.  EYES: Pupils equal, round, reactive to light and accommodation. No scleral icterus. Extraocular muscles intact.  HEENT: Head atraumatic, normocephalic. Oropharynx and nasopharynx clear.  NECK:  Supple, no jugular venous distention. No thyroid enlargement, no tenderness.  LUNGS: Normal breath sounds bilaterally, no wheezing, rales,rhonchi or crepitation. No use of accessory muscles of respiration.  CARDIOVASCULAR: Regular rate and rhythm, S1, S2 normal. No murmurs, rubs, or gallops.  ABDOMEN: Soft, nondistended, nontender. Bowel sounds present. No organomegaly or mass.  EXTREMITIES: No pedal edema, cyanosis, or clubbing.  NEUROLOGIC: Cranial nerves II through XII are intact. Muscle strength 5/5 in all extremities. Sensation intact. Gait not checked.  PSYCHIATRIC: The patient is alert and oriented x 3.  Normal affect and good eye contact. SKIN: No obvious rash, lesion, or ulcer.   LABORATORY PANEL:   CBC No results for input(s): WBC, HGB, HCT, PLT in the last 168 hours. ------------------------------------------------------------------------------------------------------------------  Chemistries  No results for input(s): NA, K, CL, CO2, GLUCOSE, BUN, CREATININE, CALCIUM, MG, AST, ALT, ALKPHOS, BILITOT in the last 168 hours.  Invalid input(s): GFRCGP ------------------------------------------------------------------------------------------------------------------  Cardiac Enzymes No results for input(s): TROPONINI in the last 168 hours. ------------------------------------------------------------------------------------------------------------------  RADIOLOGY:  No results found.    IMPRESSION AND PLAN:  Assessment and Plan: No notes have been filed under this hospital service. Service: Hospitalist      DVT prophylaxis: Lovenox***  Advanced Care Planning:  Code Status:  full code***  Family Communication:  The plan of care was discussed in details with the patient (and family). I answered all questions. The patient agreed to proceed with the above mentioned plan. Further management will depend upon hospital course. Disposition Plan: Back to previous home environment Consults called: none***  All the records are reviewed and case discussed with ED provider.  Status is: Observation {Observation:23811}   At the time of the admission, it appears that the appropriate admission status for this patient is inpatient.  This is judged to be reasonable and necessary in order to provide the required intensity of service to ensure the patient's safety given the presenting symptoms, physical exam findings and initial radiographic and laboratory data in the context of comorbid conditions.  The patient requires inpatient status due to high intensity of service, high risk of further deterioration and  high frequency of surveillance required.  I certify that at the time of admission, it is my clinical judgment that the patient will require inpatient hospital care extending more than 2 midnights.                            Dispo: The patient is from: Home              Anticipated d/c is to: Home              Patient currently is not medically stable to d/c.              Difficult to place patient: No  Christel Mormon M.D on 07/24/2021 at 10:26 PM  [facility]   From 7 PM-7 AM, contact night-coverage [email or web address]  CC: Primary care physician; Celene Squibb, MD

## 2021-07-24 NOTE — Telephone Encounter (Signed)
Patient has been at [facility] since yesterday with chest pains.  ?States that he is going to have to have another stent. He is requesting to speak with Dr. Domenic Polite.  ?

## 2021-07-25 DIAGNOSIS — I480 Paroxysmal atrial fibrillation: Secondary | ICD-10-CM

## 2021-07-25 DIAGNOSIS — I25119 Atherosclerotic heart disease of native coronary artery with unspecified angina pectoris: Secondary | ICD-10-CM | POA: Diagnosis not present

## 2021-07-25 DIAGNOSIS — I25810 Atherosclerosis of coronary artery bypass graft(s) without angina pectoris: Secondary | ICD-10-CM | POA: Diagnosis not present

## 2021-07-25 DIAGNOSIS — J209 Acute bronchitis, unspecified: Secondary | ICD-10-CM

## 2021-07-25 DIAGNOSIS — I739 Peripheral vascular disease, unspecified: Secondary | ICD-10-CM | POA: Diagnosis not present

## 2021-07-25 DIAGNOSIS — N400 Enlarged prostate without lower urinary tract symptoms: Secondary | ICD-10-CM

## 2021-07-25 DIAGNOSIS — I252 Old myocardial infarction: Secondary | ICD-10-CM | POA: Diagnosis not present

## 2021-07-25 DIAGNOSIS — I10 Essential (primary) hypertension: Secondary | ICD-10-CM | POA: Diagnosis not present

## 2021-07-25 DIAGNOSIS — I200 Unstable angina: Secondary | ICD-10-CM

## 2021-07-25 DIAGNOSIS — T82855A Stenosis of coronary artery stent, initial encounter: Secondary | ICD-10-CM | POA: Diagnosis not present

## 2021-07-25 DIAGNOSIS — I25110 Atherosclerotic heart disease of native coronary artery with unstable angina pectoris: Secondary | ICD-10-CM | POA: Diagnosis not present

## 2021-07-25 DIAGNOSIS — Z89611 Acquired absence of right leg above knee: Secondary | ICD-10-CM | POA: Diagnosis not present

## 2021-07-25 DIAGNOSIS — I440 Atrioventricular block, first degree: Secondary | ICD-10-CM | POA: Diagnosis not present

## 2021-07-25 DIAGNOSIS — I110 Hypertensive heart disease with heart failure: Secondary | ICD-10-CM | POA: Diagnosis not present

## 2021-07-25 DIAGNOSIS — I5032 Chronic diastolic (congestive) heart failure: Secondary | ICD-10-CM | POA: Diagnosis not present

## 2021-07-25 DIAGNOSIS — J45909 Unspecified asthma, uncomplicated: Secondary | ICD-10-CM | POA: Diagnosis not present

## 2021-07-25 DIAGNOSIS — Z20822 Contact with and (suspected) exposure to covid-19: Secondary | ICD-10-CM | POA: Diagnosis not present

## 2021-07-25 DIAGNOSIS — E782 Mixed hyperlipidemia: Secondary | ICD-10-CM | POA: Diagnosis not present

## 2021-07-25 DIAGNOSIS — I7121 Aneurysm of the ascending aorta, without rupture: Secondary | ICD-10-CM | POA: Diagnosis not present

## 2021-07-25 LAB — COMPREHENSIVE METABOLIC PANEL
ALT: 43 U/L (ref 0–44)
AST: 28 U/L (ref 15–41)
Albumin: 2.6 g/dL — ABNORMAL LOW (ref 3.5–5.0)
Alkaline Phosphatase: 59 U/L (ref 38–126)
Anion gap: 7 (ref 5–15)
BUN: 12 mg/dL (ref 8–23)
CO2: 24 mmol/L (ref 22–32)
Calcium: 7.8 mg/dL — ABNORMAL LOW (ref 8.9–10.3)
Chloride: 101 mmol/L (ref 98–111)
Creatinine, Ser: 0.79 mg/dL (ref 0.61–1.24)
GFR, Estimated: >60 mL/min (ref >60)
Glucose, Bld: 145 mg/dL — ABNORMAL HIGH (ref 70–99)
Potassium: 3.6 mmol/L (ref 3.5–5.1)
Sodium: 132 mmol/L — ABNORMAL LOW (ref 135–145)
Total Bilirubin: 1 mg/dL (ref 0.3–1.2)
Total Protein: 5.1 g/dL — ABNORMAL LOW (ref 6.5–8.1)

## 2021-07-25 LAB — CBC
HCT: 27.6 % — ABNORMAL LOW (ref 39.0–52.0)
Hemoglobin: 9.7 g/dL — ABNORMAL LOW (ref 13.0–17.0)
MCH: 30.5 pg (ref 26.0–34.0)
MCHC: 35.1 g/dL (ref 30.0–36.0)
MCV: 86.8 fL (ref 80.0–100.0)
Platelets: 181 10*3/uL (ref 150–400)
RBC: 3.18 MIL/uL — ABNORMAL LOW (ref 4.22–5.81)
RDW: 19.3 % — ABNORMAL HIGH (ref 11.5–15.5)
WBC: 5.8 10*3/uL (ref 4.0–10.5)
nRBC: 0 % (ref 0.0–0.2)

## 2021-07-25 LAB — URINALYSIS, ROUTINE W REFLEX MICROSCOPIC
Bilirubin Urine: NEGATIVE
Glucose, UA: NEGATIVE mg/dL
Hgb urine dipstick: NEGATIVE
Ketones, ur: NEGATIVE mg/dL
Leukocytes,Ua: NEGATIVE
Nitrite: NEGATIVE
Protein, ur: NEGATIVE mg/dL
Specific Gravity, Urine: 1.016 (ref 1.005–1.030)
pH: 6 (ref 5.0–8.0)

## 2021-07-25 LAB — BRAIN NATRIURETIC PEPTIDE: B Natriuretic Peptide: 300.1 pg/mL — ABNORMAL HIGH (ref 0.0–100.0)

## 2021-07-25 MED ORDER — SODIUM CHLORIDE 0.9 % WEIGHT BASED INFUSION: 1.0000 mL/kg/h | INTRAVENOUS | Status: DC

## 2021-07-25 MED ORDER — TAMSULOSIN HCL 0.4 MG PO CAPS
0.4000 mg | ORAL_CAPSULE | Freq: Every day | ORAL | Status: DC
Start: 2021-07-25 — End: 2021-07-27
  Administered 2021-07-25 – 2021-07-26 (×2): 0.4 mg via ORAL
  Filled 2021-07-25 (×2): qty 1

## 2021-07-25 MED ORDER — LORATADINE 10 MG PO TABS
10.0000 mg | ORAL_TABLET | Freq: Every day | ORAL | Status: DC
  Administered 2021-07-25 – 2021-07-27 (×3): 10 mg via ORAL
  Filled 2021-07-25 (×3): qty 1

## 2021-07-25 MED ORDER — CLOPIDOGREL BISULFATE 75 MG PO TABS
75.0000 mg | ORAL_TABLET | Freq: Every day | ORAL | Status: DC
Start: 2021-07-25 — End: 2021-07-27
  Administered 2021-07-25 – 2021-07-27 (×3): 75 mg via ORAL
  Filled 2021-07-25 (×3): qty 1

## 2021-07-25 MED ORDER — METHYLPREDNISOLONE SODIUM SUCC 125 MG IJ SOLR
80.0000 mg | INTRAMUSCULAR | Status: DC
  Administered 2021-07-26 – 2021-07-27 (×2): 80 mg via INTRAVENOUS
  Filled 2021-07-25 (×2): qty 2

## 2021-07-25 MED ORDER — SODIUM CHLORIDE 0.9 % WEIGHT BASED INFUSION
3.0000 mL/kg/h | INTRAVENOUS | Status: DC
  Administered 2021-07-26: 3 mL/kg/h via INTRAVENOUS

## 2021-07-25 MED ORDER — SODIUM CHLORIDE 0.9 % IV SOLN
1.0000 g | Freq: Every day | INTRAVENOUS | Status: DC
  Administered 2021-07-25 – 2021-07-26 (×3): 1 g via INTRAVENOUS
  Filled 2021-07-25 (×3): qty 10

## 2021-07-25 MED ORDER — POLYVINYL ALCOHOL 1.4 % OP SOLN
1.0000 [drp] | Freq: Every day | OPHTHALMIC | Status: DC | PRN
  Filled 2021-07-25: qty 15

## 2021-07-25 MED ORDER — ATORVASTATIN CALCIUM 80 MG PO TABS
80.0000 mg | ORAL_TABLET | Freq: Every day | ORAL | Status: DC
  Administered 2021-07-25 – 2021-07-26 (×2): 80 mg via ORAL
  Filled 2021-07-25 (×2): qty 1

## 2021-07-25 MED ORDER — ISOSORBIDE MONONITRATE ER 60 MG PO TB24
60.0000 mg | ORAL_TABLET | Freq: Two times a day (BID) | ORAL | Status: DC
Start: 2021-07-25 — End: 2021-07-27
  Administered 2021-07-25 – 2021-07-27 (×6): 60 mg via ORAL
  Filled 2021-07-25 (×6): qty 1

## 2021-07-25 MED ORDER — GUAIFENESIN-DM 100-10 MG/5ML PO SYRP: 5.0000 mL | ORAL_SOLUTION | ORAL | Status: DC | PRN

## 2021-07-25 MED ORDER — FUROSEMIDE 40 MG PO TABS
40.0000 mg | ORAL_TABLET | Freq: Every day | ORAL | Status: DC
  Administered 2021-07-25 – 2021-07-27 (×3): 40 mg via ORAL
  Filled 2021-07-25 (×3): qty 1

## 2021-07-25 MED ORDER — SODIUM CHLORIDE 0.9 % IV SOLN: INTRAVENOUS | Status: DC

## 2021-07-25 MED ORDER — METHYLPREDNISOLONE SODIUM SUCC 125 MG IJ SOLR
125.0000 mg | Freq: Once | INTRAMUSCULAR | Status: AC
  Administered 2021-07-25: 125 mg via INTRAVENOUS
  Filled 2021-07-25: qty 2

## 2021-07-25 MED ORDER — FLUTICASONE FUROATE-VILANTEROL 100-25 MCG/ACT IN AEPB
1.0000 | INHALATION_SPRAY | Freq: Every day | RESPIRATORY_TRACT | Status: DC
  Administered 2021-07-25 – 2021-07-26 (×2): 1 via RESPIRATORY_TRACT
  Filled 2021-07-25: qty 28

## 2021-07-25 MED ORDER — SODIUM CHLORIDE 0.9% FLUSH
3.0000 mL | Freq: Two times a day (BID) | INTRAVENOUS | Status: DC
  Administered 2021-07-25 – 2021-07-27 (×3): 3 mL via INTRAVENOUS

## 2021-07-25 MED ORDER — NITROGLYCERIN 0.4 MG SL SUBL: 0.4000 mg | SUBLINGUAL_TABLET | SUBLINGUAL | Status: DC | PRN

## 2021-07-25 MED ORDER — ALBUTEROL SULFATE (2.5 MG/3ML) 0.083% IN NEBU
2.5000 mg | INHALATION_SOLUTION | RESPIRATORY_TRACT | Status: DC | PRN
  Administered 2021-07-26: 2.5 mg via RESPIRATORY_TRACT
  Filled 2021-07-25: qty 3

## 2021-07-25 MED ORDER — METOPROLOL TARTRATE 25 MG PO TABS
25.0000 mg | ORAL_TABLET | Freq: Two times a day (BID) | ORAL | Status: DC
Start: 2021-07-25 — End: 2021-07-27
  Administered 2021-07-25 – 2021-07-27 (×6): 25 mg via ORAL
  Filled 2021-07-25 (×6): qty 1

## 2021-07-25 MED ORDER — APIXABAN 5 MG PO TABS
5.0000 mg | ORAL_TABLET | Freq: Two times a day (BID) | ORAL | Status: DC
Start: 2021-07-25 — End: 2021-07-25
  Filled 2021-07-25: qty 1

## 2021-07-25 MED ORDER — IPRATROPIUM-ALBUTEROL 0.5-2.5 (3) MG/3ML IN SOLN
3.0000 mL | Freq: Four times a day (QID) | RESPIRATORY_TRACT | Status: DC
  Administered 2021-07-25 – 2021-07-27 (×7): 3 mL via RESPIRATORY_TRACT
  Filled 2021-07-25 (×7): qty 3

## 2021-07-25 MED ORDER — OMEGA-3-ACID ETHYL ESTERS 1 G PO CAPS
1.0000 g | ORAL_CAPSULE | Freq: Every day | ORAL | Status: DC
  Administered 2021-07-25 – 2021-07-27 (×3): 1 g via ORAL
  Filled 2021-07-25 (×3): qty 1

## 2021-07-25 MED ORDER — SODIUM CHLORIDE 0.9% FLUSH: 3.0000 mL | INTRAVENOUS | Status: DC | PRN

## 2021-07-25 MED ORDER — LOSARTAN POTASSIUM 50 MG PO TABS
50.0000 mg | ORAL_TABLET | Freq: Every day | ORAL | Status: DC
  Administered 2021-07-25 – 2021-07-27 (×3): 50 mg via ORAL
  Filled 2021-07-25 (×3): qty 1

## 2021-07-25 MED ORDER — GUAIFENESIN ER 600 MG PO TB12
600.0000 mg | ORAL_TABLET | Freq: Two times a day (BID) | ORAL | Status: DC
  Administered 2021-07-25 – 2021-07-27 (×6): 600 mg via ORAL
  Filled 2021-07-25 (×6): qty 1

## 2021-07-25 MED ORDER — DILTIAZEM HCL ER COATED BEADS 240 MG PO CP24
240.0000 mg | ORAL_CAPSULE | Freq: Every day | ORAL | Status: DC
  Administered 2021-07-25 – 2021-07-27 (×3): 240 mg via ORAL
  Filled 2021-07-25 (×3): qty 1

## 2021-07-25 MED ORDER — ASCORBIC ACID 500 MG PO TABS
500.0000 mg | ORAL_TABLET | Freq: Every day | ORAL | Status: DC
  Administered 2021-07-25 – 2021-07-27 (×3): 500 mg via ORAL
  Filled 2021-07-25 (×3): qty 1

## 2021-07-25 MED ORDER — HEPARIN SODIUM (PORCINE) 5000 UNIT/ML IJ SOLN
5000.0000 [IU] | Freq: Three times a day (TID) | INTRAMUSCULAR | Status: DC
  Administered 2021-07-25 – 2021-07-27 (×5): 5000 [IU] via SUBCUTANEOUS
  Filled 2021-07-25 (×5): qty 1

## 2021-07-25 MED ORDER — AMIODARONE HCL 200 MG PO TABS
100.0000 mg | ORAL_TABLET | Freq: Every day | ORAL | Status: DC
  Administered 2021-07-25 – 2021-07-27 (×3): 100 mg via ORAL
  Filled 2021-07-25 (×3): qty 1

## 2021-07-25 MED ORDER — SODIUM CHLORIDE 0.9 % IV SOLN: 250.0000 mL | INTRAVENOUS | Status: DC | PRN

## 2021-07-25 MED ORDER — LIP MEDEX EX OINT
TOPICAL_OINTMENT | CUTANEOUS | Status: DC | PRN
  Filled 2021-07-25: qty 7

## 2021-07-25 MED ORDER — ADULT MULTIVITAMIN W/MINERALS CH
1.0000 | ORAL_TABLET | Freq: Every day | ORAL | Status: DC
  Administered 2021-07-25 – 2021-07-26 (×2): 1 via ORAL
  Filled 2021-07-25 (×2): qty 1

## 2021-07-25 NOTE — Assessment & Plan Note (Signed)
Management as above °

## 2021-07-25 NOTE — Plan of Care (Signed)

## 2021-07-25 NOTE — Assessment & Plan Note (Addendum)
-   Diffuse bilateral wheezing at the time of the exam, coughing, productive phlegm, febrile with chest pain.  Chest pain is now improving ?-still wheezing today, cont steroids, cont scheduled nebs, flutter valve, added Breo daily, antitussives, Claritin daily ?-Continue IV Rocephin, follow sputum culture ?-Chest x-ray at [facility] showed bronchitis ?

## 2021-07-25 NOTE — Hospital Course (Addendum)
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  Also reported cough productive of greenish sputum that started on Thursday and got worse on Friday.  Noted low-grade fevers of 100.6 ?F with wheezing. ? ?Patient was seen in [facility] ED, chest x-ray showed bronchitis.  Troponin initial 17-> 29-> 37, transferred to [facility] for further work-up for  unstable angina. ? ?3/15: Underwent cardiac cath with PCI to distal RCA ?

## 2021-07-25 NOTE — Assessment & Plan Note (Addendum)
Continue statin. 

## 2021-07-25 NOTE — Assessment & Plan Note (Addendum)
-   Rate controlled, continue Lopressor, amiodarone, Cardizem  ?-Eliquis currently on hold, will resume in am if bleeding issues  ?

## 2021-07-25 NOTE — Assessment & Plan Note (Addendum)
-   History of CAD with 4vCABG x2, hypertension, paroxysmal A-fib on Eliquis, dyslipidemia, PAD ?-Troponins negative, appreciate cardiology recommendation ?- underwent cardiac cath, severe 3 vessel CAD with known occluded SVG grafts. Successful PCI to distal RCA. ?- recommended eliquis tomorrow, once resumed, aspirin can be discontinued. Continue plavix.  ? ?

## 2021-07-25 NOTE — Assessment & Plan Note (Addendum)
-   Continue antihypertensives, BP stable ?

## 2021-07-25 NOTE — Consult Note (Signed)
?Cardiology Consultation:  ? ?Patient ID: Eric Beltran. ?MRN: [id number]; DOB: August 03, 1940 ? ?Admit date: 07/24/2021 ?Date of Consult: 07/25/2021 ? ?PCP:  Celene Squibb, MD ?  ?[place] [facility] Prov[facility] ?Cardiologist:  Rozann Lesches, MD      ? ? ?Patient Profile:  ? ?Eric Beltran. is a 81 y.o. male with a hx of multivessel CAD status post CABG in 2013 with concurrent repair of ascending aortic aneurysm at [facility] and multiple stents, HTN, pAF on eliquis, hx of AI and PAD who is being seen 07/25/2021 for the evaluation of CHEST PAIN at the request of Dr. [name]. ? ?History of Present Illness:  ? ?Eric Beltran started having nasal congestion and productive cough since last Thursday. He went to [facility] on 07/23/21 with c/o chest pain. He says he started having mid-chest pressure like pain across his chest, constant without radiation. The pain is improved by lying down and worsened with exertion. The pain is not related to inspiration or cough. Denies fever, chills, syncope, heart palpitations, dyspnea, nausea, vomiting,  dysuria, diarrhea, pedal edema or any bleeding events. ? ?At OSH, he was found to have acute bronchitis with low grade fever. COVID negative. Troponin x3 were negative. Given his history of CAD and exertional chest pain, he was transferred her for further workup. ? ?Patient is currently chest pain free, on room air, no acute distress.  ? ? ?Past Medical History:  ?Diagnosis Date  ? Arthritis   ? Ascending aortic aneurysm   ? Status post repair 12/2011 at [facility]  ? BPH (benign prostatic hyperplasia)   ? Chronic diastolic CHF (congestive heart failure) ([place])   ? Coronary atherosclerosis of native coronary artery   ? a. Multivessel status post prior stenting and ultimately CABG 12/2011 at [facility] with with LIMA to LAD, SVG to PLB, SVG to ramus, SVG to OM . b. Last cath 06/2014 (following ischemic nuc) -> medical therapy, no feasible way to revascularize LCx territory.  ? Essential hypertension    ? Mixed hyperlipidemia   ? Myocardial infarction [facility])   ? PAD (peripheral artery disease) ([place])   ? PAF (paroxysmal atrial fibrillation) ([place])   ? Supraventricular tachycardia ([place])   ? ? ?Past Surgical History:  ?Procedure Laterality Date  ? ASCENDING AORTIC ANEURYSM REPAIR  12/2011 [facility]  ? 26 mm.Dacron graft  ? CORONARY ARTERY BYPASS GRAFT  12/2011 [facility]  ? LIMA to LAD, SVG to PLB, SVG to ramus, SVG to OM  ? CORONARY ARTERY BYPASS GRAFT N/A 08/06/2017  ? Procedure: REDO CORONARY ARTERY BYPASS GRAFTING (CABG) TIMES FOUR UTILIZING LEFT GREATER SAPHENOUS VEIN HAVESTED ENDOVASCULARLY, LEFT RADIAL ARTERY.  RIGHT AXILLARY CANNULATION;  Surgeon: Gaye Pollack, MD;  Location: [place];  Service: Open Heart Surgery;  Laterality: N/A;  ? CORONARY ATHERECTOMY N/A 02/10/2020  ? Procedure: CORONARY ATHERECTOMY;  Surgeon: [name], MD;  Location: [place] CV LAB;  Service: Cardiovascular;  Laterality: N/A;  ? CORONARY BALLOON ANGIOPLASTY N/A 06/10/2020  ? Procedure: CORONARY BALLOON ANGIOPLASTY;  Surgeon: Wellington Hampshire, MD;  Location: [place] CV LAB;  Service: Cardiovascular;  Laterality: N/A;  ? CORONARY STENT INTERVENTION N/A 02/10/2020  ? Procedure: CORONARY STENT INTERVENTION;  Surgeon: [name], MD;  Location: [place] CV LAB;  Service: Cardiovascular;  Laterality: N/A;  ? HERNIA REPAIR    ? LEFT HEART CATH AND CORS/GRAFTS ANGIOGRAPHY N/A 07/11/2017  ? Procedure: LEFT HEART CATH AND CORS/GRAFTS ANGIOGRAPHY;  Surgeon: Troy Sine, MD;  Location:  [place] INVASIVE CV LAB;  Service: Cardiovascular;  Laterality: N/A;  ? LEFT HEART CATH AND CORS/GRAFTS ANGIOGRAPHY N/A 09/12/2018  ? Procedure: LEFT HEART CATH AND CORS/GRAFTS ANGIOGRAPHY;  Surgeon: Troy Sine, MD;  Location: [place] CV LAB;  Service: Cardiovascular;  Laterality: N/A;  ? LEFT HEART CATH AND CORS/GRAFTS ANGIOGRAPHY N/A 02/10/2020  ? Procedure: LEFT HEART CATH AND CORS/GRAFTS ANGIOGRAPHY;  Surgeon: [name], MD;  Location: [place] CV LAB;  Service: Cardiovascular;  Laterality: N/A;  ? LEFT HEART CATH AND CORS/GRAFTS ANGIOGRAPHY N/A 06/10/2020  ? Procedure: LEFT HEART CATH AND CORS/GRAFTS ANGIOGRAPHY;  Surgeon: Wellington Hampshire, MD;  Location: [place] CV LAB;  Service: Cardiovascular;  Laterality: N/A;  ? LEFT HEART CATHETERIZATION WITH CORONARY ANGIOGRAM N/A 06/18/2014  ? Procedure: LEFT HEART CATHETERIZATION WITH CORONARY ANGIOGRAM;  Surgeon: Blane Ohara, MD;  Location: [facility] [place] CATH LAB;  Service: Cardiovascular;  Laterality: N/A;  ? RADIAL ARTERY HARVEST Left 08/06/2017  ? Procedure: RADIAL ARTERY HARVEST;  Surgeon: Gaye Pollack, MD;  Location: [place];  Service: Open Heart Surgery;  Laterality: Left;  ? TEE WITHOUT CARDIOVERSION N/A 08/06/2017  ? Procedure: TRANSESOPHAGEAL ECHOCARDIOGRAM (TEE);  Surgeon: Gaye Pollack, MD;  Location: [place];  Service: Open Heart Surgery;  Laterality: N/A;  ? TOTAL HIP ARTHROPLASTY Right 02/07/2016  ? Procedure: RIGHT TOTAL HIP ARTHROPLASTY ANTERIOR APPROACH;  Surgeon: Mcarthur Rossetti, MD;  Location: [place];  Service: Orthopedics;  Laterality: Right;  ? TRANSURETHRAL RESECTION OF PROSTATE    ?  ? ? ? ?Inpatient Medications: ?Scheduled Meds: ? alum & mag hydroxide-simeth  30 mL Oral Once  ? And  ? lidocaine  15 mL Oral Once  ? amiodarone  100 mg Oral Daily  ? apixaban  5 mg Oral BID  ? vitamin C  500 mg Oral Daily  ? aspirin EC  81 mg Oral Daily  ? atorvastatin  80 mg Oral QHS  ? Chlorhexidine Gluconate Cloth  6 each Topical Daily  ? clopidogrel  75 mg Oral Daily  ? diltiazem  240 mg Oral Daily  ? furosemide  40 mg Oral Daily  ? guaiFENesin  600 mg Oral BID  ? isosorbide mononitrate  60 mg Oral BID  ? losartan  50 mg Oral Daily  ? metoprolol tartrate  25 mg Oral BID  ? multivitamin with minerals  1 tablet Oral QHS  ? omega-3 acid ethyl esters  1 g Oral Daily  ? sodium chloride flush  10-40 mL Intracatheter Q12H  ? tamsulosin  0.4 mg Oral QHS  ? ?Continuous Infusions: ? sodium chloride 100  mL/hr at 07/25/21 0015  ? cefTRIAXone (ROCEPHIN)  IV    ? ?PRN Meds: ?acetaminophen, ALPRAZolam, magnesium hydroxide, nitroGLYCERIN, ondansetron (ZOFRAN) IV, polyvinyl alcohol, sodium chloride flush, traZODone ? ?Allergies:    ?Allergies  ?Allergen Reactions  ? Chocolate Flavor Other (See Comments)  ?  Stuffy nose   ? ? ?Social History:   ?Social History  ? ?Socioeconomic History  ? Marital status: Widowed  ?  Spouse name: Not on file  ? Number of children: Not on file  ? Years of education: Not on file  ? Highest education level: Not on file  ?Occupational History  ? Not on file  ?Tobacco Use  ? Smoking status: Never  ? Smokeless tobacco: Never  ? Tobacco comments:  ?  tobacco use - no  ?Vaping Use  ? Vaping Use: Never used  ?Substance and Sexual Activity  ? Alcohol use:  Yes  ?  Alcohol/week: 0.0 standard drinks  ?  Comment: Occasional  ? Drug use: No  ? Sexual activity: Not on file  ?Other Topics Concern  ? Not on file  ?Social History Narrative  ? Retired, widower, does not get regular exercise.   ?   ?   ? ?Social Determinants of Health  ? ?Financial Resource Strain: Not on file  ?Food Insecurity: Not on file  ?Transportation Needs: Not on file  ?Physical Activity: Not on file  ?Stress: Not on file  ?Social Connections: Not on file  ?Intimate Partner Violence: Not on file  ?  ?Family History:   ? ?Family History  ?Problem Relation Age of Onset  ? Hypertension Father   ? Parkinson's disease Father   ? Hypertension Mother   ? Lung cancer Sister   ?  ? ?ROS:  ?Please see the history of present illness.  ? ?All other ROS reviewed and negative.    ? ?Physical Exam/Data:  ? ?Vitals:  ? 07/24/21 2230 07/25/21 0010  ?BP: (!) 170/77 (!) 151/77  ?Pulse: 79   ?Resp: 20 16  ?Temp: 98.9 ?F (37.2 ?C) (!) 100.6 ?F (38.1 ?C)  ?TempSrc: Oral Oral  ?SpO2: 95% 93%  ? ?No intake or output data in the 24 hours ending 07/25/21 0244 ?Last 3 Weights 06/13/2021 03/09/2021 11/25/2020  ?Weight (lbs) 191 lb 191 lb 3.2 oz 177 lb 6.4 oz   ?Weight (kg) 86.637 kg 86.728 kg 80.468 kg  ?   ?There is no height or weight on file to calculate BMI.  ?General:  Well nourished, well developed, in no acute distress ?HEENT: normal ?Neck: no JVD ?Vascular: N

## 2021-07-25 NOTE — Assessment & Plan Note (Addendum)
-   Continue Flomax, Proscar ?

## 2021-07-25 NOTE — Progress Notes (Signed)
? ? ? [facility] ?                                                                            ? ? ?Eric Beltran, is a 81 y.o. male, DOB - [birth year], LKG:[id number] ?Admit date - 07/24/2021    ?Outpatient Primary MD for the patient is Celene Squibb, MD ? ?LOS - 1  days ? ? ? ?Brief summary  ? ?Patient is a 81 year old male with a history of sarcoma on chemotherapy,  right AKA, CAD status post 4v CABG x2, hypertension, paroxysmal A-fib on Eliquis, dyslipidemia, PAD presented with acute onset of midsternal chest pain, pressure, graded 7/10.  Also reported cough productive of greenish sputum that started on Thursday and got worse on Friday.  Noted low-grade fevers of 100.6 ?F with wheezing. ? ?Patient was seen in [facility] ED, chest x-ray showed bronchitis.  Troponin initial 17-> 29-> 37, transferred to [facility] for further work-up for  unstable angina. ? ? ?Assessment & Plan  ? ? ?Assessment and Plan: ?* Chest pain ?- History of CAD with 4vCABG x2, hypertension, paroxysmal A-fib on Eliquis, dyslipidemia, PAD ?-Troponins negative, appreciate cardiology condition, follow 2D echo ?-Plan for left heart cath in a.m. ? ?Acute bronchitis/reactive airway disease ?- Diffuse bilateral wheezing at the time of the exam, coughing, productive phlegm, febrile with chest pain.  Chest pain is now improving ?-Solu-Medrol 125 mg IV x1, followed by Solu-Medrol 40 mg every 12 hours ?-Placed on scheduled nebs, flutter valve, added Breo daily, antitussives, Claritin daily ?- follow BNP, DC fluids ?-Continue IV Rocephin, follow sputum culture ?-Chest x-ray at [facility] showed bronchitis ? ?Paroxysmal atrial fibrillation (HCC) ?- Rate controlled, continue Lopressor, amiodarone, Cardizem  ?-Eliquis currently on hold for possible cardiac cath in a.m. ?-Continue aspirin, Plavix ? ?Essential hypertension, benign ?- Continue antihypertensives, BP stable ? ?BPH (benign prostatic hyperplasia) ?- Continue Flomax, Proscar ? ?Mixed  hyperlipidemia ?- Continue statin ? ?Coronary artery disease involving native coronary artery of native heart with angina pectoris (HCC)-resolved as of 07/25/2021 ?- Management as above. ? ? ?Code Status: Full CODE STATUS ?DVT Prophylaxis:    Heparin subcu ? ? ?Level of Care: Level of care: Telemetry Cardiac ?Family Communication: Updated patient ? ?Disposition Plan:     Remains inpatient appropriate: Plan for cardiac cath in a.m. ? ?Procedures:  ?None ? ?Consultants:   ?Cardiology ? ?Antimicrobials:  ? ?IV Rocephin 07/25/2021---> ? ? ?Medications ? ? alum & mag hydroxide-simeth  30 mL Oral Once  ? And  ? lidocaine  15 mL Oral Once  ? amiodarone  100 mg Oral Daily  ? vitamin C  500 mg Oral Daily  ? aspirin EC  81 mg Oral Daily  ? atorvastatin  80 mg Oral QHS  ? Chlorhexidine Gluconate Cloth  6 each Topical Daily  ? clopidogrel  75 mg Oral Daily  ? diltiazem  240 mg Oral Daily  ? fluticasone furoate-vilanterol  1 puff Inhalation Daily  ? furosemide  40 mg Oral Daily  ? guaiFENesin  600 mg Oral BID  ? ipratropium-albuterol  3 mL Nebulization Q6H  ? isosorbide mononitrate  60 mg Oral BID  ? loratadine  10 mg Oral  Daily  ? losartan  50 mg Oral Daily  ? methylPREDNISolone (SOLU-MEDROL) injection  125 mg Intravenous Once  ? [START ON 07/26/2021] methylPREDNISolone (SOLU-MEDROL) injection  80 mg Intravenous Q24H  ? metoprolol tartrate  25 mg Oral BID  ? multivitamin with minerals  1 tablet Oral QHS  ? omega-3 acid ethyl esters  1 g Oral Daily  ? sodium chloride flush  10-40 mL Intracatheter Q12H  ? sodium chloride flush  3 mL Intravenous Q12H  ? tamsulosin  0.4 mg Oral QHS  ? ? ? ?Subjective:  ? ?Eric Beltran was seen and examined today.  BP still not well controlled.  Diffusely wheezing bilaterally, cough.  Chest pain improving, no dizziness, nausea vomiting abdominal pain or diarrhea.  Still having temp 100.6 ?F  ? ?Objective:  ? ?Vitals:  ? 07/24/21 2230 07/25/21 0010 07/25/21 0330 07/25/21 8832  ?BP: (!) 170/77 (!)  151/77 (!) 153/75 (!) 142/84  ?Pulse: 79  78 83  ?Resp: 20 16 (!) 25 20  ?Temp: 98.9 ?F (37.2 ?C) (!) 100.6 ?F (38.1 ?C) 98.8 ?F (37.1 ?C) 99.4 ?F (37.4 ?C)  ?TempSrc: Oral Oral Oral Oral  ?SpO2: 95% 93% 93% 94%  ?Weight: 78.7 kg  76.8 kg   ?Height: '5\' 7"'$  (1.702 m)     ? ? ?Intake/Output Summary (Last 24 hours) at 07/25/2021 1103 ?Last data filed at 07/25/2021 5498 ?Gross per 24 hour  ?Intake 1503.69 ml  ?Output 300 ml  ?Net 1203.69 ml  ? ?Filed Weights  ? 07/24/21 2230 07/25/21 0330  ?Weight: 78.7 kg 76.8 kg  ? ? ? ?Exam ?General: Alert and oriented x 3, NAD ?Cardiovascular: S1 S2 auscultated, RRR ?Respiratory: Bilateral diffuse expiratory wheezing ?Gastrointestinal: Soft, nontender, nondistended, + bowel sounds ?Ext: no pedal edema bilaterally ?Neuro: no new deficits ?Skin: No rashes ?Psych: Normal affect and demeanor, alert and oriented x3  ? ? ?Data Reviewed:  I have personally reviewed following labs  ? ? ?CBC ?Lab Results  ?Component Value Date  ? WBC 5.8 07/25/2021  ? RBC 3.18 (L) 07/25/2021  ? HGB 9.7 (L) 07/25/2021  ? HCT 27.6 (L) 07/25/2021  ? MCV 86.8 07/25/2021  ? MCH 30.5 07/25/2021  ? PLT 181 07/25/2021  ? MCHC 35.1 07/25/2021  ? RDW 19.3 (H) 07/25/2021  ? LYMPHSABS 1.6 09/10/2018  ? MONOABS 1.1 (H) 09/10/2018  ? EOSABS 0.1 09/10/2018  ? BASOSABS 0.1 09/10/2018  ? ? ? ?Last metabolic panel ?Lab Results  ?Component Value Date  ? NA 132 (L) 07/25/2021  ? K 3.6 07/25/2021  ? CL 101 07/25/2021  ? CO2 24 07/25/2021  ? BUN 12 07/25/2021  ? CREATININE 0.79 07/25/2021  ? GLUCOSE 145 (H) 07/25/2021  ? GFRNONAA >60 07/25/2021  ? GFRAA >60 02/11/2020  ? CALCIUM 7.8 (L) 07/25/2021  ? PROT 5.1 (L) 07/25/2021  ? ALBUMIN 2.6 (L) 07/25/2021  ? BILITOT 1.0 07/25/2021  ? ALKPHOS 59 07/25/2021  ? AST 28 07/25/2021  ? ALT 43 07/25/2021  ? ANIONGAP 7 07/25/2021  ? ? ? ? ? ? ?Estill Cotta M.D. ?[facility] ?07/25/2021, 11:03 AM ? ?Available via Epic secure chat 7am-7pm ?After 7 pm, please refer to night coverage provider  listed on amion. ? ?  ?

## 2021-07-25 NOTE — Progress Notes (Signed)
Patient seen by fellow early this morning.  I reviewed his history which includes coronary artery disease with prior CABG in 2013 followed by multiple PCI procedures of the RCA.  He has had recurrent in-stent restenosis.  He was admitted with progressive symptoms of chest discomfort in the setting of acute bronchitis and low-grade fever.  COVID testing is negative, troponins are negative.  Symptoms are felt to be consistent with unstable angina as he describes similar symptoms to what he is experienced with his ischemic chest pain in the past.  He is on a good medical program with aspirin, clopidogrel, atorvastatin, and isosorbide.  He has been anticoagulated for paroxysmal atrial fibrillation with apixaban.  Last apixaban dose was yesterday morning. ? ?On my evaluation, the patient is CP-free. He has had 2 episodes of central chest pain walking short distances to the bathroom a few days ago. This was exactly like his past angina. No resting anginal symptoms.  ? ?Plan: with accelerating angina and high-risk for restenosis, plan cath +/- PCI tomorrow (received apixaban yesterday). Continue ASA/plavix, hold apixaban. I have reviewed the risks, indications, and alternatives to cardiac catheterization, possible angioplasty, and stenting with the patient. Risks include but are not limited to bleeding, infection, vascular injury, stroke, myocardial infection, arrhythmia, kidney injury, radiation-related injury in the case of prolonged fluoroscopy use, emergency cardiac surgery, and death. The patient understands the risks of serious complication is 1-2 in 7169 with diagnostic cardiac cath and 1-2% or less with angioplasty/stenting.  Consider right radial approach (left radial harvested) as very unlikely for any change in LIMA anatomy (has been patent over many cath procedures). ? ?Eric Beltran ?07/25/2021 ?9:12 AM ? ?

## 2021-07-26 ENCOUNTER — Inpatient Hospital Stay (HOSPITAL_COMMUNITY)
Admission: AD | Disposition: A | Discharge status: Home / Self Care | Payer: Self-pay | Source: Other Acute Inpatient Hospital | Attending: Internal Medicine

## 2021-07-26 ENCOUNTER — Observation Stay (HOSPITAL_COMMUNITY): Payer: Medicare Other

## 2021-07-26 DIAGNOSIS — I5032 Chronic diastolic (congestive) heart failure: Secondary | ICD-10-CM | POA: Diagnosis not present

## 2021-07-26 DIAGNOSIS — I25110 Atherosclerotic heart disease of native coronary artery with unstable angina pectoris: Secondary | ICD-10-CM | POA: Diagnosis not present

## 2021-07-26 DIAGNOSIS — J45909 Unspecified asthma, uncomplicated: Secondary | ICD-10-CM | POA: Diagnosis not present

## 2021-07-26 DIAGNOSIS — I10 Essential (primary) hypertension: Secondary | ICD-10-CM | POA: Diagnosis not present

## 2021-07-26 DIAGNOSIS — Z951 Presence of aortocoronary bypass graft: Secondary | ICD-10-CM | POA: Diagnosis not present

## 2021-07-26 DIAGNOSIS — I480 Paroxysmal atrial fibrillation: Secondary | ICD-10-CM | POA: Diagnosis not present

## 2021-07-26 DIAGNOSIS — Z801 Family history of malignant neoplasm of trachea, bronchus and lung: Secondary | ICD-10-CM | POA: Diagnosis not present

## 2021-07-26 DIAGNOSIS — I440 Atrioventricular block, first degree: Secondary | ICD-10-CM | POA: Diagnosis not present

## 2021-07-26 DIAGNOSIS — I739 Peripheral vascular disease, unspecified: Secondary | ICD-10-CM | POA: Diagnosis not present

## 2021-07-26 DIAGNOSIS — I110 Hypertensive heart disease with heart failure: Secondary | ICD-10-CM | POA: Diagnosis not present

## 2021-07-26 DIAGNOSIS — I25810 Atherosclerosis of coronary artery bypass graft(s) without angina pectoris: Secondary | ICD-10-CM | POA: Diagnosis not present

## 2021-07-26 DIAGNOSIS — Z96641 Presence of right artificial hip joint: Secondary | ICD-10-CM | POA: Diagnosis not present

## 2021-07-26 DIAGNOSIS — Z9109 Other allergy status, other than to drugs and biological substances: Secondary | ICD-10-CM | POA: Diagnosis not present

## 2021-07-26 DIAGNOSIS — Z8249 Family history of ischemic heart disease and other diseases of the circulatory system: Secondary | ICD-10-CM | POA: Diagnosis not present

## 2021-07-26 DIAGNOSIS — R079 Chest pain, unspecified: Secondary | ICD-10-CM | POA: Diagnosis not present

## 2021-07-26 DIAGNOSIS — N400 Enlarged prostate without lower urinary tract symptoms: Secondary | ICD-10-CM

## 2021-07-26 DIAGNOSIS — I252 Old myocardial infarction: Secondary | ICD-10-CM | POA: Diagnosis not present

## 2021-07-26 DIAGNOSIS — Z955 Presence of coronary angioplasty implant and graft: Secondary | ICD-10-CM | POA: Diagnosis not present

## 2021-07-26 DIAGNOSIS — I200 Unstable angina: Secondary | ICD-10-CM | POA: Diagnosis present

## 2021-07-26 DIAGNOSIS — J209 Acute bronchitis, unspecified: Secondary | ICD-10-CM | POA: Diagnosis not present

## 2021-07-26 DIAGNOSIS — I25119 Atherosclerotic heart disease of native coronary artery with unspecified angina pectoris: Secondary | ICD-10-CM | POA: Diagnosis not present

## 2021-07-26 DIAGNOSIS — E782 Mixed hyperlipidemia: Secondary | ICD-10-CM | POA: Diagnosis not present

## 2021-07-26 DIAGNOSIS — M1990 Unspecified osteoarthritis, unspecified site: Secondary | ICD-10-CM | POA: Diagnosis present

## 2021-07-26 DIAGNOSIS — I471 Supraventricular tachycardia: Secondary | ICD-10-CM | POA: Diagnosis not present

## 2021-07-26 DIAGNOSIS — I7121 Aneurysm of the ascending aorta, without rupture: Secondary | ICD-10-CM | POA: Diagnosis not present

## 2021-07-26 DIAGNOSIS — E1151 Type 2 diabetes mellitus with diabetic peripheral angiopathy without gangrene: Secondary | ICD-10-CM | POA: Diagnosis not present

## 2021-07-26 DIAGNOSIS — Y828 Other medical devices associated with adverse incidents: Secondary | ICD-10-CM | POA: Diagnosis present

## 2021-07-26 DIAGNOSIS — T82855A Stenosis of coronary artery stent, initial encounter: Secondary | ICD-10-CM | POA: Diagnosis not present

## 2021-07-26 DIAGNOSIS — Z820 Family history of epilepsy and other diseases of the nervous system: Secondary | ICD-10-CM | POA: Diagnosis not present

## 2021-07-26 DIAGNOSIS — Z89611 Acquired absence of right leg above knee: Secondary | ICD-10-CM | POA: Diagnosis not present

## 2021-07-26 DIAGNOSIS — Z20822 Contact with and (suspected) exposure to covid-19: Secondary | ICD-10-CM | POA: Diagnosis not present

## 2021-07-26 HISTORY — PX: CORONARY ULTRASOUND/IVUS: CATH118244

## 2021-07-26 HISTORY — PX: CORONARY STENT INTERVENTION: CATH118234

## 2021-07-26 HISTORY — PX: LEFT HEART CATH AND CORS/GRAFTS ANGIOGRAPHY: CATH118250

## 2021-07-26 LAB — LIPID PANEL
Cholesterol: 128 mg/dL (ref 0–200)
HDL: 37 mg/dL — ABNORMAL LOW (ref >40)
LDL Cholesterol: 75 mg/dL (ref 0–99)
Total CHOL/HDL Ratio: 3.5 RATIO
Triglycerides: 78 mg/dL (ref <150)
VLDL: 16 mg/dL (ref 0–40)

## 2021-07-26 LAB — BASIC METABOLIC PANEL
Anion gap: 14 (ref 5–15)
BUN: 16 mg/dL (ref 8–23)
CO2: 21 mmol/L — ABNORMAL LOW (ref 22–32)
Calcium: 8.5 mg/dL — ABNORMAL LOW (ref 8.9–10.3)
Chloride: 99 mmol/L (ref 98–111)
Creatinine, Ser: 0.76 mg/dL (ref 0.61–1.24)
GFR, Estimated: >60 mL/min (ref >60)
Glucose, Bld: 230 mg/dL — ABNORMAL HIGH (ref 70–99)
Potassium: 3.5 mmol/L (ref 3.5–5.1)
Sodium: 134 mmol/L — ABNORMAL LOW (ref 135–145)

## 2021-07-26 LAB — CBC
HCT: 30.7 % — ABNORMAL LOW (ref 39.0–52.0)
Hemoglobin: 10.5 g/dL — ABNORMAL LOW (ref 13.0–17.0)
MCH: 30.3 pg (ref 26.0–34.0)
MCHC: 34.2 g/dL (ref 30.0–36.0)
MCV: 88.5 fL (ref 80.0–100.0)
Platelets: 177 10*3/uL (ref 150–400)
RBC: 3.47 MIL/uL — ABNORMAL LOW (ref 4.22–5.81)
RDW: 19.6 % — ABNORMAL HIGH (ref 11.5–15.5)
WBC: 8.3 10*3/uL (ref 4.0–10.5)
nRBC: 0 % (ref 0.0–0.2)

## 2021-07-26 LAB — POCT ACTIVATED CLOTTING TIME: Activated Clotting Time: 281 seconds

## 2021-07-26 SURGERY — LEFT HEART CATH AND CORS/GRAFTS ANGIOGRAPHY
Anesthesia: LOCAL

## 2021-07-26 MED ORDER — SODIUM CHLORIDE 0.9 % IV SOLN: 250.0000 mL | INTRAVENOUS | Status: DC | PRN

## 2021-07-26 MED ORDER — IOHEXOL 350 MG/ML SOLN
INTRAVENOUS | Status: DC | PRN
  Administered 2021-07-26: 155 mL via INTRACARDIAC

## 2021-07-26 MED ORDER — LIDOCAINE HCL (PF) 1 % IJ SOLN
INTRAMUSCULAR | Status: AC
  Filled 2021-07-26: qty 30

## 2021-07-26 MED ORDER — LIDOCAINE HCL (PF) 1 % IJ SOLN
INTRAMUSCULAR | Status: DC | PRN
  Administered 2021-07-26: 5 mL

## 2021-07-26 MED ORDER — FENTANYL CITRATE (PF) 100 MCG/2ML IJ SOLN
INTRAMUSCULAR | Status: DC | PRN
  Administered 2021-07-26: 25 ug via INTRAVENOUS

## 2021-07-26 MED ORDER — HEPARIN (PORCINE) IN NACL 1000-0.9 UT/500ML-% IV SOLN
INTRAVENOUS | Status: AC
  Filled 2021-07-26: qty 500

## 2021-07-26 MED ORDER — SODIUM CHLORIDE 0.9% FLUSH
3.0000 mL | Freq: Two times a day (BID) | INTRAVENOUS | Status: DC
  Administered 2021-07-27: 3 mL via INTRAVENOUS

## 2021-07-26 MED ORDER — MIDAZOLAM HCL 2 MG/2ML IJ SOLN
INTRAMUSCULAR | Status: DC | PRN
Start: 2021-07-26 — End: 2021-07-26
  Administered 2021-07-26: 1 mg via INTRAVENOUS

## 2021-07-26 MED ORDER — NITROGLYCERIN 1 MG/10 ML FOR IR/CATH LAB
INTRA_ARTERIAL | Status: AC
  Filled 2021-07-26: qty 10

## 2021-07-26 MED ORDER — SODIUM CHLORIDE 0.9% FLUSH: 3.0000 mL | INTRAVENOUS | Status: DC | PRN

## 2021-07-26 MED ORDER — SODIUM CHLORIDE 0.9 % WEIGHT BASED INFUSION
1.0000 mL/kg/h | INTRAVENOUS | Status: AC
  Administered 2021-07-26: 1 mL/kg/h via INTRAVENOUS

## 2021-07-26 MED ORDER — VERAPAMIL HCL 2.5 MG/ML IV SOLN
INTRAVENOUS | Status: AC
  Filled 2021-07-26: qty 2

## 2021-07-26 MED ORDER — VERAPAMIL HCL 2.5 MG/ML IV SOLN
INTRAVENOUS | Status: DC | PRN
  Administered 2021-07-26: 10 mL via INTRA_ARTERIAL

## 2021-07-26 MED ORDER — HEPARIN SODIUM (PORCINE) 1000 UNIT/ML IJ SOLN
INTRAMUSCULAR | Status: DC | PRN
  Administered 2021-07-26 (×2): 4000 [IU] via INTRAVENOUS

## 2021-07-26 MED ORDER — FENTANYL CITRATE (PF) 100 MCG/2ML IJ SOLN
INTRAMUSCULAR | Status: AC
  Filled 2021-07-26: qty 2

## 2021-07-26 MED ORDER — HEPARIN SODIUM (PORCINE) 1000 UNIT/ML IJ SOLN
INTRAMUSCULAR | Status: AC
  Filled 2021-07-26: qty 10

## 2021-07-26 MED ORDER — SODIUM CHLORIDE 0.9 % IV SOLN
INTRAVENOUS | Status: AC | PRN
  Administered 2021-07-26: 77 mL via INTRAVENOUS

## 2021-07-26 MED ORDER — HEPARIN (PORCINE) IN NACL 1000-0.9 UT/500ML-% IV SOLN
INTRAVENOUS | Status: DC | PRN
  Administered 2021-07-26 (×2): 500 mL

## 2021-07-26 MED ORDER — MIDAZOLAM HCL 2 MG/2ML IJ SOLN
INTRAMUSCULAR | Status: AC
  Filled 2021-07-26: qty 2

## 2021-07-26 MED ORDER — POTASSIUM CHLORIDE 20 MEQ PO PACK
40.0000 meq | PACK | Freq: Once | ORAL | Status: AC
  Administered 2021-07-26: 40 meq via ORAL
  Filled 2021-07-26: qty 2

## 2021-07-26 SURGICAL SUPPLY — 19 items
BALLN SAPPHIRE 2.5X12 (BALLOONS) ×2
BALLN SAPPHIRE ~~LOC~~ 3.25X12 (BALLOONS) ×1 IMPLANT
BALLOON SAPPHIRE 2.5X12 (BALLOONS) IMPLANT
CATH DRAGONFLY OPTIS 2.7FR (CATHETERS) ×1 IMPLANT
CATH INFINITI 5 FR JL3.5 (CATHETERS) ×1 IMPLANT
CATH INFINITI 5FR JK (CATHETERS) ×1 IMPLANT
CATH LAUNCHER 6FR JR4 (CATHETERS) ×1 IMPLANT
CATH OPTITORQUE TIG 4.0 5F (CATHETERS) ×1 IMPLANT
DEVICE RAD COMP TR BAND LRG (VASCULAR PRODUCTS) ×1 IMPLANT
GLIDESHEATH SLEND SS 6F .021 (SHEATH) ×1 IMPLANT
GUIDEWIRE INQWIRE 1.5J.035X260 (WIRE) IMPLANT
INQWIRE 1.5J .035X260CM (WIRE) ×2
KIT ENCORE 26 ADVANTAGE (KITS) ×1 IMPLANT
KIT HEART LEFT (KITS) ×2 IMPLANT
PACK CARDIAC CATHETERIZATION (CUSTOM PROCEDURE TRAY) ×2 IMPLANT
STENT ONYX FRONTIER 3.0X15 (Permanent Stent) ×1 IMPLANT
TRANSDUCER W/STOPCOCK (MISCELLANEOUS) ×2 IMPLANT
TUBING CIL FLEX 10 FLL-RA (TUBING) ×2 IMPLANT
WIRE RUNTHROUGH .014X180CM (WIRE) ×1 IMPLANT

## 2021-07-26 NOTE — H&P (View-Only) (Signed)
? ?Progress Note ? ?Patient Name: Eric Beltran. ?Date of Encounter: 07/26/2021 ? ?[facility] Cardiologist: Rozann Lesches, MD [name], MD ? ?Subjective  ? ?Patient reports that he has not ambulated much since being hospitalized however when he did ambulate he did not have any chest discomfort. Patient did have a brief episode of chest tightness when he was talking with one of his friends that remitted with no intervention.  ? ?Inpatient Medications  ?  ?Scheduled Meds: ? alum & mag hydroxide-simeth  30 mL Oral Once  ? And  ? lidocaine  15 mL Oral Once  ? amiodarone  100 mg Oral Daily  ? vitamin C  500 mg Oral Daily  ? aspirin EC  81 mg Oral Daily  ? atorvastatin  80 mg Oral QHS  ? Chlorhexidine Gluconate Cloth  6 each Topical Daily  ? clopidogrel  75 mg Oral Daily  ? diltiazem  240 mg Oral Daily  ? fluticasone furoate-vilanterol  1 puff Inhalation Daily  ? furosemide  40 mg Oral Daily  ? guaiFENesin  600 mg Oral BID  ? heparin  5,000 Units Subcutaneous Q8H  ? ipratropium-albuterol  3 mL Nebulization Q6H  ? isosorbide mononitrate  60 mg Oral BID  ? loratadine  10 mg Oral Daily  ? losartan  50 mg Oral Daily  ? methylPREDNISolone (SOLU-MEDROL) injection  80 mg Intravenous Q24H  ? metoprolol tartrate  25 mg Oral BID  ? multivitamin with minerals  1 tablet Oral QHS  ? omega-3 acid ethyl esters  1 g Oral Daily  ? sodium chloride flush  10-40 mL Intracatheter Q12H  ? sodium chloride flush  3 mL Intravenous Q12H  ? tamsulosin  0.4 mg Oral QHS  ? ?Continuous Infusions: ? sodium chloride    ? sodium chloride 1 mL/kg/hr (07/26/21 0515)  ? cefTRIAXone (ROCEPHIN)  IV 1 g (07/25/21 2221)  ? ?PRN Meds: ?sodium chloride, acetaminophen, albuterol, ALPRAZolam, guaiFENesin-dextromethorphan, lip balm, magnesium hydroxide, nitroGLYCERIN, ondansetron (ZOFRAN) IV, polyvinyl alcohol, sodium chloride flush, sodium chloride flush, traZODone  ? ?Vital Signs  ?  ?Vitals:  ? 07/25/21 2015 07/26/21 0251 07/26/21 0322 07/26/21  0740  ?BP:   120/69   ?Pulse:   61   ?Resp:   18   ?Temp:   (!) 97.3 ?F (36.3 ?C)   ?TempSrc:   Oral   ?SpO2: 94% 93% 94% 94%  ?Weight:   78.4 kg   ?Height:      ? ? ?Intake/Output Summary (Last 24 hours) at 07/26/2021 1041 ?Last data filed at 07/26/2021 0630 ?Gross per 24 hour  ?Intake 397 ml  ?Output 700 ml  ?Net -303 ml  ? ?Last 3 Weights 07/26/2021 07/25/2021 07/24/2021  ?Weight (lbs) 172 lb 13.5 oz 169 lb 5 oz 173 lb 8 oz  ?Weight (kg) 78.4 kg 76.8 kg 78.7 kg  ?   ? ?Telemetry  ?  ?Few PVCs otherwise NSR, low voltage- Personally Reviewed ? ?ECG  ?  ?Pending - Personally Reviewed ? ?Physical Exam  ? ?GEN: No acute distress.   ?Neck: No JVD ?Cardiac: RRR, heart sounds distant, no murmurs or gallops  ?Respiratory: Diffuse expiratory wheezing, breathing comfortably on room air  ?GI: Soft, nontender,  mildly distended  ?MS: No edema; No deformity. ?Neuro:  Nonfocal  ?Psych: Normal affect  ? ?Labs  ?  ?High Sensitivity Troponin:  No results for input(s): TROPONINIHS in the last 720 hours.   ?Chemistry ?Recent Labs  ?Lab 07/25/21 ?0610 07/26/21 ?0610  ?  NA 132* 134*  ?K 3.6 3.5  ?CL 101 99  ?CO2 24 21*  ?GLUCOSE 145* 230*  ?BUN 12 16  ?CREATININE 0.79 0.76  ?CALCIUM 7.8* 8.5*  ?PROT 5.1*  --   ?ALBUMIN 2.6*  --   ?AST 28  --   ?ALT 43  --   ?ALKPHOS 59  --   ?BILITOT 1.0  --   ?GFRNONAA >60 >60  ?ANIONGAP 7 14  ?  ?Lipids  ?Lipid Panel  ?   ?Component Value Date/Time  ? CHOL 130 03/06/2017 0610  ? TRIG 262 (H) 03/06/2017 0610  ? HDL 28 (L) 03/06/2017 0610  ? CHOLHDL 4.6 03/06/2017 0610  ? VLDL 52 (H) 03/06/2017 0610  ? [place] 50 03/06/2017 0610  ? ?  ?Hematology ?Recent Labs  ?Lab 07/25/21 ?0610 07/26/21 ?0610  ?WBC 5.8 8.3  ?RBC 3.18* 3.47*  ?HGB 9.7* 10.5*  ?HCT 27.6* 30.7*  ?MCV 86.8 88.5  ?MCH 30.5 30.3  ?MCHC 35.1 34.2  ?RDW 19.3* 19.6*  ?PLT 181 177  ? ?Thyroid No results for input(s): TSH, FREET4 in the last 168 hours.  ?BNP ?Recent Labs  ?Lab 07/25/21 ?0610  ?BNP 300.1*  ?  ?DDimer No results for input(s): DDIMER  in the last 168 hours.  ? ?Radiology  ?  ?No results found. ? ?Cardiac Studies  ?LHC 06/10/20 ?1.  Severe underlying three-vessel coronary artery disease with patent LIMA to LAD.  All other grafts are known to be occluded.  Patent stents in the right coronary artery.  However, there is severe focal in-stent restenosis likely in the overlap area of the 2 stents.  I could not determine if there is a few millimeter gap between the 2 stents. ?2.  Normal LV systolic function normal left ventricular end-diastolic pressure. ?3.  Successful balloon angioplasty to the right coronary artery using a 3.25 noncompliant balloon to 20 atm.  I elected not to add another stent given previously placed stents to avoid multiple layers.  However, if there is restenosis again in this area, it should be treated with adding another drug-eluting stent. ? ?Patient Profile  ?   ?81 y.o. 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  ? ?Assessment & Plan  ?  ?#Unstable angina ?#H/o multivessel CAD s/p CABG in 2013 c/b graft failure s/p redo CABG in 2019 and multiple stents ?Patient is without chest pain this AM. He p/w chest pain that he noted was similar to his chest pain prior to CABG and  stent placement. His troponins were negative and ECG without evidence of acute ischemia. EKG this AM is pending. Telemetry notable for few PVCs otherwise NSR. Patient's most recent echo was 08/20/2020 and is unfortunately not available in the EMR however per report noted to have EF >55%, mild aortic valve regurgitation, mild dilatation of left atrium, and  mild aortic root dilatation. ?He is known known to have chronically occluded grafts except for LIMA to LAD.  ?-Patient is posted for LHC this PM ?-Continue asa, plavix, lipitor, imdur, losartan ?-F/u TTE ? ?#Paroxysmal atrial fibrillation ?-Now  in sinus rhythm ?-Continue telemetry  ?-Continue amiodarone, metoprolol, and diltiazem ? ?#Hypertension ?-Continue losartan and diltiazem  ? ? ?For questions or updates, please contact [facility] ?Please consult [email or web address] for contact info under  ? ?  ?Signed, ?Rick Duff, MD PGY-2 ?Internal Medicine  ?07/26/2021, 10:41 AM    ? ?Patient seen, examined. Available data reviewed. Agree with findings, assessment, and plan as outlined by Rick Duff, MD. On my exam, he is alert, oriented, in NAD. Daughter at bedside.  JVP is normal, lungs are clear bilaterally, heart is regular rate and rhythm, no murmur gallop, abdomen is soft and nontender, right AKA noted, no left leg edema.  No chest pain overnight but did have some chest discomfort with walking now resolved with rest.  Plan as per yesterday's note to proceed with cardiac catheterization and possible PCI today.  Suspect restenosis of the native right coronary artery.  The patient's apixaban has been on hold now for greater than 24 hours and he remains on clopidogrel. Questions about cath/PCI answered - he's been through the procedure multiple times.  ? ?[name], M.D. ?07/26/2021 ?11:52 AM ? ?

## 2021-07-26 NOTE — Progress Notes (Signed)
Mobility Specialist Progress Note: ? ? 07/26/21 1100  ?Mobility  ?Activity Ambulated with assistance in hallway  ?Level of Assistance Contact guard assist, steadying assist  ?Assistive Device Front wheel walker  ?Distance Ambulated (ft) 500 ft  ?Activity Response Tolerated well  ?$Mobility charge 1 Mobility  ? ?Pt eager for mobility. Independently donned R prosthesis. Required CGA for safety. Pt left sitting in chair with all needs met.  ? ?Nelta Numbers ?Acute Rehab ?Phone: [phone] ?Office Phone: [phone] ? ?

## 2021-07-26 NOTE — Progress Notes (Signed)
? ? ? [facility] ?                                                                            ? ? ?Eric Beltran, is a 81 y.o. male, DOB - 1940-06-25, WNI:[id number] ?Admit date - 07/24/2021    ?Outpatient Primary MD for the patient is Celene Squibb, MD ? ?LOS - 1  days ? ? ? ?Brief summary  ? ?Patient is a 81 year old male with a history of sarcoma on chemotherapy,  right AKA, CAD status post 4v CABG x2, hypertension, paroxysmal A-fib on Eliquis, dyslipidemia, PAD presented with acute onset of midsternal chest pain, pressure, graded 7/10.  Also reported cough productive of greenish sputum that started on Thursday and got worse on Friday.  Noted low-grade fevers of 100.6 ?F with wheezing. ? ?Patient was seen in [facility] ED, chest x-ray showed bronchitis.  Troponin initial 17-> 29-> 37, transferred to [facility] for further work-up for  unstable angina. ? ?3/15: Underwent cardiac cath with PCI to distal RCA ? ? ?Assessment & Plan  ? ? ?Assessment and Plan: ?* Chest pain ?- History of CAD with 4vCABG x2, hypertension, paroxysmal A-fib on Eliquis, dyslipidemia, PAD ?-Troponins negative, appreciate cardiology recommendation ?- underwent cardiac cath, severe 3 vessel CAD with known occluded SVG grafts. Successful PCI to distal RCA. ?- recommended eliquis tomorrow, once resumed, aspirin can be discontinued. Continue plavix.  ? ? ?Acute bronchitis/reactive airway disease ?- Diffuse bilateral wheezing at the time of the exam, coughing, productive phlegm, febrile with chest pain.  Chest pain is now improving ?-still wheezing today, cont steroids, cont scheduled nebs, flutter valve, added Breo daily, antitussives, Claritin daily ?-Continue IV Rocephin, follow sputum culture ?-Chest [facility] at [facility] showed bronchitis ? ?Paroxysmal atrial fibrillation (HCC) ?- Rate controlled, continue Lopressor, amiodarone, Cardizem  ?-Eliquis currently on hold, will resume in am if bleeding issues  ? ?Essential  hypertension, benign ?- Continue antihypertensives, BP stable ? ?BPH (benign prostatic hyperplasia) ?- Continue Flomax, Proscar ? ?Mixed hyperlipidemia ?- Continue statin ? ?Coronary artery disease involving native coronary artery of native heart with angina pectoris (HCC)-resolved as of 07/25/2021 ?- Management as above. ? ? ?Code Status: full code  ?DVT Prophylaxis:  heparin injection 5,000 Units Start: 07/25/21 1400 ? ? ?Level of Care: Level of care: Telemetry Cardiac ?Family Communication: ? ?Disposition Plan:     Remains inpatient appropriate:  possible dc in am if cleared by cardiology  ? ?Procedures:  ?Left heart cardiac cath  ? ?Consultants:   ?Cardiology  ? ?Antimicrobials:  ? ?Anti-infectives (From admission, onward)  ? ? Start     Dose/Rate Route Frequency Ordered Stop  ? 07/25/21 0230  cefTRIAXone (ROCEPHIN) 1 g in sodium chloride 0.9 % 100 mL IVPB       ? 1 g ?200 mL/hr over 30 Minutes Intravenous Daily at bedtime 07/25/21 0227    ? ?  ? ? ? ?Medications ? ? alum & mag hydroxide-simeth  30 mL Oral Once  ? And  ? lidocaine  15 mL Oral Once  ? amiodarone  100 mg Oral Daily  ? vitamin C  500 mg Oral Daily  ? aspirin EC  81 mg  Oral Daily  ? atorvastatin  80 mg Oral QHS  ? Chlorhexidine Gluconate Cloth  6 each Topical Daily  ? clopidogrel  75 mg Oral Daily  ? diltiazem  240 mg Oral Daily  ? fluticasone furoate-vilanterol  1 puff Inhalation Daily  ? furosemide  40 mg Oral Daily  ? guaiFENesin  600 mg Oral BID  ? heparin  5,000 Units Subcutaneous Q8H  ? ipratropium-albuterol  3 mL Nebulization Q6H  ? isosorbide mononitrate  60 mg Oral BID  ? loratadine  10 mg Oral Daily  ? losartan  50 mg Oral Daily  ? methylPREDNISolone (SOLU-MEDROL) injection  80 mg Intravenous Q24H  ? metoprolol tartrate  25 mg Oral BID  ? multivitamin with minerals  1 tablet Oral QHS  ? omega-3 acid ethyl esters  1 g Oral Daily  ? sodium chloride flush  10-40 mL Intracatheter Q12H  ? sodium chloride flush  3 mL Intravenous Q12H  ? [START ON  07/27/2021] sodium chloride flush  3 mL Intravenous Q12H  ? tamsulosin  0.4 mg Oral QHS  ? ? ? ?Subjective:  ? ?Eric Beltran was seen and examined today.  Still wheezing but cough and chest pain improved. No fevers.  Patient denies dizziness, abdominal pain, N/V/D/C, new weakness, numbess, tingling. No acute events overnight.   ? ?Objective:  ? ?Vitals:  ? 07/26/21 1500 07/26/21 1515 07/26/21 1516 07/26/21 1538  ?BP: (!) 148/64  (!) 150/60 134/71  ?Pulse: 64 61 (!) 59 66  ?Resp: '17 16 17 18  '$ ?Temp:    97.8 ?F (36.6 ?C)  ?TempSrc:    Oral  ?SpO2: 95% 95% 96% 97%  ?Weight:      ?Height:      ? ? ?Intake/Output Summary (Last 24 hours) at 07/26/2021 1736 ?Last data filed at 07/26/2021 0630 ?Gross per 24 hour  ?Intake 220 ml  ?Output --  ?Net 220 ml  ? ?Filed Weights  ? 07/24/21 2230 07/25/21 0330 07/26/21 0322  ?Weight: 78.7 kg 76.8 kg 78.4 kg  ? ? ? ?Exam ?General: Alert and oriented x 3, NAD ?Cardiovascular: S1 S2 auscultated, RRR ?Respiratory: b/l expiratory wheezing  ?Gastrointestinal: Soft, nontender, nondistended, + bowel sounds ?Ext: no pedal edema bilaterally ?Neuro: no new deficits  ? ? ? ?Data Reviewed:  I have personally reviewed following labs  ? ? ?CBC ?Lab Results  ?Component Value Date  ? WBC 8.3 07/26/2021  ? RBC 3.47 (L) 07/26/2021  ? HGB 10.5 (L) 07/26/2021  ? HCT 30.7 (L) 07/26/2021  ? MCV 88.5 07/26/2021  ? MCH 30.3 07/26/2021  ? PLT 177 07/26/2021  ? MCHC 34.2 07/26/2021  ? RDW 19.6 (H) 07/26/2021  ? LYMPHSABS 1.6 09/10/2018  ? MONOABS 1.1 (H) 09/10/2018  ? EOSABS 0.1 09/10/2018  ? BASOSABS 0.1 09/10/2018  ? ? ? ?Last metabolic panel ?Lab Results  ?Component Value Date  ? NA 134 (L) 07/26/2021  ? K 3.5 07/26/2021  ? CL 99 07/26/2021  ? CO2 21 (L) 07/26/2021  ? BUN 16 07/26/2021  ? CREATININE 0.76 07/26/2021  ? GLUCOSE 230 (H) 07/26/2021  ? GFRNONAA >60 07/26/2021  ? GFRAA >60 02/11/2020  ? CALCIUM 8.5 (L) 07/26/2021  ? PROT 5.1 (L) 07/25/2021  ? ALBUMIN 2.6 (L) 07/25/2021  ? BILITOT 1.0 07/25/2021  ?  ALKPHOS 59 07/25/2021  ? AST 28 07/25/2021  ? ALT 43 07/25/2021  ? ANIONGAP 14 07/26/2021  ? ? ? ? ?Radiology Studies: I have personally reviewed the imaging studies  ?CARDIAC CATHETERIZATION ? ?  Result Date: 07/26/2021 ?  Prox LAD-1 lesion is 80% stenosed.   Prox LAD-2 lesion is 70% stenosed.   Ost LAD lesion is 30% stenosed.   Ost Cx to Prox Cx lesion is 100% stenosed.   Prox RCA to Mid RCA lesion is 30% stenosed.   Ost RPDA lesion is 65% stenosed.   1st RPL lesion is 50% stenosed.   Dist RCA-2 lesion is 30% stenosed.   Dist RCA-1 lesion is 90% stenosed.   Non-stenotic Mid RCA lesion was previously treated.   A drug-eluting stent was successfully placed using a STENT ONYX FRONTIER [id number].   Post intervention, there is a 0% residual stenosis.   The left ventricular systolic function is normal.   LV end diastolic pressure is mildly elevated.   The left ventricular ejection fraction is 55-65% by visual estimate. 1.  Severe underlying three-vessel coronary artery disease with known occluded SVG grafts.  These were not engaged today.  Patent LIMA to LAD.  Chronically occluded proximal left circumflex stent with well-developed right to left collaterals.  Patent RCA stents with focal in-stent restenosis distally in the same area that was treated before with balloon angioplasty. 2.  Normal LV systolic function mildly elevated left ventricular end-diastolic pressure. 3.  Successful OCT guided PCI and drug-eluting stent placement to the distal right coronary artery.  OCT showed well-expanded previous stent with no stent fractures and focal neointimal hyperplasia. Recommendations: Eliquis can be resumed tomorrow as long as no bleeding complications. Once Eliquis is resumed, aspirin can be discontinued.  Continue clopidogrel indefinitely as tolerated. Aggressive treatment of risk factors.   ? ? ? ? ?Estill Cotta M.D. ?[facility] ?07/26/2021, 5:36 PM ? ?Available via Epic secure chat 7am-7pm ?After 7 pm, please refer to  night coverage provider listed on amion. ? ?  ?

## 2021-07-26 NOTE — Interval H&P Note (Signed)
Cath Lab Visit (complete for each Cath Lab visit) ? ?Clinical Evaluation Leading to the Procedure:  ? ?ACS: No. ? ?Non-ACS:   ? ?Anginal Classification: CCS III ? ?Anti-ischemic medical therapy: Maximal Therapy (2 or more classes of medications) ? ?Non-Invasive Test Results: No non-invasive testing performed ? ?Prior CABG: Previous CABG ? ? ? ? ? ?History and Physical Interval Note: ? ?07/26/2021 ?1:20 PM ? ?Eric Beltran.  has presented today for surgery, with the diagnosis of nstemi.  The various methods of treatment have been discussed with the patient and family. After consideration of risks, benefits and other options for treatment, the patient has consented to  Procedure(s): ?LEFT HEART CATH AND CORS/GRAFTS ANGIOGRAPHY (N/A) as a surgical intervention.  The patient's history has been reviewed, patient examined, no change in status, stable for surgery.  I have reviewed the patient's chart and labs.  Questions were answered to the patient's satisfaction.   ? ? ?Kathlyn [place] ? ? ?

## 2021-07-26 NOTE — Progress Notes (Addendum)
? ?Progress Note ? ?Patient Name: Eric Beltran. ?Date of Encounter: 07/26/2021 ? ?[facility] Cardiologist: Eric Lesches, MD Eric Mocha, MD ? ?Subjective  ? ?Patient reports that he has not ambulated much since being hospitalized however when he did ambulate he did not have any chest discomfort. Patient did have a brief episode of chest tightness when he was talking with one of his friends that remitted with no intervention.  ? ?Inpatient Medications  ?  ?Scheduled Meds: ? alum & mag hydroxide-simeth  30 mL Oral Once  ? And  ? lidocaine  15 mL Oral Once  ? amiodarone  100 mg Oral Daily  ? vitamin C  500 mg Oral Daily  ? aspirin EC  81 mg Oral Daily  ? atorvastatin  80 mg Oral QHS  ? Chlorhexidine Gluconate Cloth  6 each Topical Daily  ? clopidogrel  75 mg Oral Daily  ? diltiazem  240 mg Oral Daily  ? fluticasone furoate-vilanterol  1 puff Inhalation Daily  ? furosemide  40 mg Oral Daily  ? guaiFENesin  600 mg Oral BID  ? heparin  5,000 Units Subcutaneous Q8H  ? ipratropium-albuterol  3 mL Nebulization Q6H  ? isosorbide mononitrate  60 mg Oral BID  ? loratadine  10 mg Oral Daily  ? losartan  50 mg Oral Daily  ? methylPREDNISolone (SOLU-MEDROL) injection  80 mg Intravenous Q24H  ? metoprolol tartrate  25 mg Oral BID  ? multivitamin with minerals  1 tablet Oral QHS  ? omega-3 acid ethyl esters  1 g Oral Daily  ? sodium chloride flush  10-40 mL Intracatheter Q12H  ? sodium chloride flush  3 mL Intravenous Q12H  ? tamsulosin  0.4 mg Oral QHS  ? ?Continuous Infusions: ? sodium chloride    ? sodium chloride 1 mL/kg/hr (07/26/21 0515)  ? cefTRIAXone (ROCEPHIN)  IV 1 g (07/25/21 2221)  ? ?PRN Meds: ?sodium chloride, acetaminophen, albuterol, ALPRAZolam, guaiFENesin-dextromethorphan, lip balm, magnesium hydroxide, nitroGLYCERIN, ondansetron (ZOFRAN) IV, polyvinyl alcohol, sodium chloride flush, sodium chloride flush, traZODone  ? ?Vital Signs  ?  ?Vitals:  ? 07/25/21 2015 07/26/21 0251 07/26/21 0322 07/26/21  0740  ?BP:   120/69   ?Pulse:   61   ?Resp:   18   ?Temp:   (!) 97.3 ?F (36.3 ?C)   ?TempSrc:   Oral   ?SpO2: 94% 93% 94% 94%  ?Weight:   78.4 kg   ?Height:      ? ? ?Intake/Output Summary (Last 24 hours) at 07/26/2021 1041 ?Last data filed at 07/26/2021 0630 ?Gross per 24 hour  ?Intake 397 ml  ?Output 700 ml  ?Net -303 ml  ? ?Last 3 Weights 07/26/2021 07/25/2021 07/24/2021  ?Weight (lbs) 172 lb 13.5 oz 169 lb 5 oz 173 lb 8 oz  ?Weight (kg) 78.4 kg 76.8 kg 78.7 kg  ?   ? ?Telemetry  ?  ?Few PVCs otherwise NSR, low voltage- Personally Reviewed ? ?ECG  ?  ?Pending - Personally Reviewed ? ?Physical Exam  ? ?GEN: No acute distress.   ?Neck: No JVD ?Cardiac: RRR, heart sounds distant, no murmurs or gallops  ?Respiratory: Diffuse expiratory wheezing, breathing comfortably on room air  ?GI: Soft, nontender,  mildly distended  ?MS: No edema; No deformity. ?Neuro:  Nonfocal  ?Psych: Normal affect  ? ?Labs  ?  ?High Sensitivity Troponin:  No results for input(s): TROPONINIHS in the last 720 hours.   ?Chemistry ?Recent Labs  ?Lab 07/25/21 ?0610 07/26/21 ?0610  ?  NA 132* 134*  ?K 3.6 3.5  ?CL 101 99  ?CO2 24 21*  ?GLUCOSE 145* 230*  ?BUN 12 16  ?CREATININE 0.79 0.76  ?CALCIUM 7.8* 8.5*  ?PROT 5.1*  --   ?ALBUMIN 2.6*  --   ?AST 28  --   ?ALT 43  --   ?ALKPHOS 59  --   ?BILITOT 1.0  --   ?GFRNONAA >60 >60  ?ANIONGAP 7 14  ?  ?Lipids  ?Lipid Panel  ?   ?Component Value Date/Time  ? CHOL 130 03/06/2017 0610  ? TRIG 262 (H) 03/06/2017 0610  ? HDL 28 (L) 03/06/2017 0610  ? CHOLHDL 4.6 03/06/2017 0610  ? VLDL 52 (H) 03/06/2017 0610  ? [place] 50 03/06/2017 0610  ? ?  ?Hematology ?Recent Labs  ?Lab 07/25/21 ?0610 07/26/21 ?0610  ?WBC 5.8 8.3  ?RBC 3.18* 3.47*  ?HGB 9.7* 10.5*  ?HCT 27.6* 30.7*  ?MCV 86.8 88.5  ?MCH 30.5 30.3  ?MCHC 35.1 34.2  ?RDW 19.3* 19.6*  ?PLT 181 177  ? ?Thyroid No results for input(s): TSH, FREET4 in the last 168 hours.  ?BNP ?Recent Labs  ?Lab 07/25/21 ?0610  ?BNP 300.1*  ?  ?DDimer No results for input(s): DDIMER  in the last 168 hours.  ? ?Radiology  ?  ?No results found. ? ?Cardiac Studies  ?LHC 06/10/20 ?1.  Severe underlying three-vessel coronary artery disease with patent LIMA to LAD.  All other grafts are known to be occluded.  Patent stents in the right coronary artery.  However, there is severe focal in-stent restenosis likely in the overlap area of the 2 stents.  I could not determine if there is a few millimeter gap between the 2 stents. ?2.  Normal LV systolic function normal left ventricular end-diastolic pressure. ?3.  Successful balloon angioplasty to the right coronary artery using a 3.25 noncompliant balloon to 20 atm.  I elected not to add another stent given previously placed stents to avoid multiple layers.  However, if there is restenosis again in this area, it should be treated with adding another drug-eluting stent. ? ?Patient Profile  ?   ?81 y.o. 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  ? ?Assessment & Plan  ?  ?#Unstable angina ?#H/o multivessel CAD s/p CABG in 2013 c/b graft failure s/p redo CABG in 2019 and multiple stents ?Patient is without chest pain this AM. He p/w chest pain that he noted was similar to his chest pain prior to CABG and  stent placement. His troponins were negative and ECG without evidence of acute ischemia. EKG this AM is pending. Telemetry notable for few PVCs otherwise NSR. Patient's most recent echo was 08/20/2020 and is unfortunately not available in the EMR however per report noted to have EF >55%, mild aortic valve regurgitation, mild dilatation of left atrium, and  mild aortic root dilatation. ?He is known known to have chronically occluded grafts except for LIMA to LAD.  ?-Patient is posted for LHC this PM ?-Continue asa, plavix, lipitor, imdur, losartan ?-F/u TTE ? ?#Paroxysmal atrial fibrillation ?-Now  in sinus rhythm ?-Continue telemetry  ?-Continue amiodarone, metoprolol, and diltiazem ? ?#Hypertension ?-Continue losartan and diltiazem  ? ? ?For questions or updates, please contact Eric Beltran ?Please consult [email or web address] for contact info under  ? ?  ?Signed, ?Eric Duff, MD PGY-2 ?Internal Medicine  ?07/26/2021, 10:41 AM    ? ?Patient seen, examined. Available data reviewed. Agree with findings, assessment, and plan as outlined by Eric Duff, MD. On my exam, he is alert, oriented, in NAD. Daughter at bedside.  JVP is normal, lungs are clear bilaterally, heart is regular rate and rhythm, no murmur gallop, abdomen is soft and nontender, right AKA noted, no left leg edema.  No chest pain overnight but did have some chest discomfort with walking now resolved with rest.  Plan as per yesterday's note to proceed with cardiac catheterization and possible PCI today.  Suspect restenosis of the native right coronary artery.  The patient's apixaban has been on hold now for greater than 24 hours and he remains on clopidogrel. Questions about cath/PCI answered - he's been through the procedure multiple times.  ? ?Eric Beltran, M.D. ?07/26/2021 ?11:52 AM ? ?

## 2021-07-27 ENCOUNTER — Inpatient Hospital Stay (HOSPITAL_COMMUNITY): Payer: Medicare Other

## 2021-07-27 ENCOUNTER — Encounter (HOSPITAL_COMMUNITY): Payer: Self-pay | Admitting: Cardiovascular Disease

## 2021-07-27 DIAGNOSIS — Z20822 Contact with and (suspected) exposure to covid-19: Secondary | ICD-10-CM | POA: Diagnosis not present

## 2021-07-27 DIAGNOSIS — I480 Paroxysmal atrial fibrillation: Secondary | ICD-10-CM | POA: Diagnosis not present

## 2021-07-27 DIAGNOSIS — I25110 Atherosclerotic heart disease of native coronary artery with unstable angina pectoris: Secondary | ICD-10-CM | POA: Diagnosis not present

## 2021-07-27 DIAGNOSIS — I200 Unstable angina: Secondary | ICD-10-CM

## 2021-07-27 DIAGNOSIS — I252 Old myocardial infarction: Secondary | ICD-10-CM | POA: Diagnosis not present

## 2021-07-27 DIAGNOSIS — T82855A Stenosis of coronary artery stent, initial encounter: Secondary | ICD-10-CM | POA: Diagnosis not present

## 2021-07-27 DIAGNOSIS — I7121 Aneurysm of the ascending aorta, without rupture: Secondary | ICD-10-CM | POA: Diagnosis not present

## 2021-07-27 DIAGNOSIS — I25119 Atherosclerotic heart disease of native coronary artery with unspecified angina pectoris: Secondary | ICD-10-CM | POA: Diagnosis not present

## 2021-07-27 DIAGNOSIS — I5032 Chronic diastolic (congestive) heart failure: Secondary | ICD-10-CM | POA: Diagnosis not present

## 2021-07-27 DIAGNOSIS — I440 Atrioventricular block, first degree: Secondary | ICD-10-CM | POA: Diagnosis not present

## 2021-07-27 DIAGNOSIS — I471 Supraventricular tachycardia: Secondary | ICD-10-CM | POA: Diagnosis not present

## 2021-07-27 DIAGNOSIS — R079 Chest pain, unspecified: Secondary | ICD-10-CM | POA: Diagnosis not present

## 2021-07-27 DIAGNOSIS — Z89611 Acquired absence of right leg above knee: Secondary | ICD-10-CM | POA: Diagnosis not present

## 2021-07-27 DIAGNOSIS — I25810 Atherosclerosis of coronary artery bypass graft(s) without angina pectoris: Secondary | ICD-10-CM | POA: Diagnosis not present

## 2021-07-27 DIAGNOSIS — I110 Hypertensive heart disease with heart failure: Secondary | ICD-10-CM | POA: Diagnosis not present

## 2021-07-27 DIAGNOSIS — E782 Mixed hyperlipidemia: Secondary | ICD-10-CM | POA: Diagnosis not present

## 2021-07-27 DIAGNOSIS — E1151 Type 2 diabetes mellitus with diabetic peripheral angiopathy without gangrene: Secondary | ICD-10-CM | POA: Diagnosis not present

## 2021-07-27 DIAGNOSIS — I739 Peripheral vascular disease, unspecified: Secondary | ICD-10-CM | POA: Diagnosis not present

## 2021-07-27 DIAGNOSIS — J45909 Unspecified asthma, uncomplicated: Secondary | ICD-10-CM | POA: Diagnosis not present

## 2021-07-27 LAB — ECHOCARDIOGRAM COMPLETE
AR max vel: 4.72 cm2
AV Peak grad: 5.3 mmHg
Ao pk vel: 1.15 m/s
Area-P 1/2: 3.45 cm2
Calc EF: 61.3 %
Height: 67 in
P 1/2 time: 598 msec
S' Lateral: 3.5 cm
Single Plane A2C EF: 61.3 %
Single Plane A4C EF: 61 %
Weight: 2765.45 oz

## 2021-07-27 LAB — CBC
HCT: 26.2 % — ABNORMAL LOW (ref 39.0–52.0)
HCT: 27.4 % — ABNORMAL LOW (ref 39.0–52.0)
Hemoglobin: 8.9 g/dL — ABNORMAL LOW (ref 13.0–17.0)
Hemoglobin: 9.3 g/dL — ABNORMAL LOW (ref 13.0–17.0)
MCH: 30 pg (ref 26.0–34.0)
MCH: 29.9 pg (ref 26.0–34.0)
MCHC: 34 g/dL (ref 30.0–36.0)
MCHC: 33.9 g/dL (ref 30.0–36.0)
MCV: 88.2 fL (ref 80.0–100.0)
MCV: 88.1 fL (ref 80.0–100.0)
Platelets: 160 10*3/uL (ref 150–400)
Platelets: 163 10*3/uL (ref 150–400)
RBC: 2.97 MIL/uL — ABNORMAL LOW (ref 4.22–5.81)
RBC: 3.11 MIL/uL — ABNORMAL LOW (ref 4.22–5.81)
RDW: 19.6 % — ABNORMAL HIGH (ref 11.5–15.5)
RDW: 19.8 % — ABNORMAL HIGH (ref 11.5–15.5)
WBC: 16.7 10*3/uL — ABNORMAL HIGH (ref 4.0–10.5)
WBC: 19.9 10*3/uL — ABNORMAL HIGH (ref 4.0–10.5)
nRBC: 0 % (ref 0.0–0.2)
nRBC: 0 % (ref 0.0–0.2)

## 2021-07-27 LAB — BASIC METABOLIC PANEL
Anion gap: 11 (ref 5–15)
BUN: 16 mg/dL (ref 8–23)
CO2: 21 mmol/L — ABNORMAL LOW (ref 22–32)
Calcium: 8.1 mg/dL — ABNORMAL LOW (ref 8.9–10.3)
Chloride: 104 mmol/L (ref 98–111)
Creatinine, Ser: 0.8 mg/dL (ref 0.61–1.24)
GFR, Estimated: >60 mL/min (ref >60)
Glucose, Bld: 207 mg/dL — ABNORMAL HIGH (ref 70–99)
Potassium: 3.9 mmol/L (ref 3.5–5.1)
Sodium: 136 mmol/L (ref 135–145)

## 2021-07-27 LAB — HEMOGLOBIN A1C
Hgb A1c MFr Bld: 7.1 % — ABNORMAL HIGH (ref 4.8–5.6)
Mean Plasma Glucose: 157 mg/dL

## 2021-07-27 MED ORDER — PANTOPRAZOLE SODIUM 40 MG PO TBEC: 40.0000 mg | DELAYED_RELEASE_TABLET | Freq: Every day | ORAL | Status: DC

## 2021-07-27 MED ORDER — PANTOPRAZOLE SODIUM 40 MG PO TBEC: 40.0000 mg | DELAYED_RELEASE_TABLET | Freq: Every day | ORAL | 0 refills | Status: DC

## 2021-07-27 MED ORDER — HEPARIN SOD (PORK) LOCK FLUSH 100 UNIT/ML IV SOLN
500.0000 [IU] | INTRAVENOUS | Status: AC | PRN
  Administered 2021-07-27: 500 [IU]
  Filled 2021-07-27: qty 5

## 2021-07-27 MED ORDER — APIXABAN 5 MG PO TABS
5.0000 mg | ORAL_TABLET | Freq: Two times a day (BID) | ORAL | Status: DC
  Administered 2021-07-27: 5 mg via ORAL
  Filled 2021-07-27: qty 1

## 2021-07-27 MED ORDER — TAMSULOSIN 0.4 MG CAPSULE
ORAL_CAPSULE | 5 refills | 0 days | Status: CP
Start: 2021-07-27 — End: ?

## 2021-07-27 MED FILL — Nitroglycerin IV Soln 100 MCG/ML in D5W: INTRA_ARTERIAL | Qty: 10 | Status: AC

## 2021-07-27 NOTE — Progress Notes (Addendum)
? ?Progress Note ? ?Patient Name: Eric Beltran. ?Date of Encounter: 07/27/2021 ? ?[facility] Cardiologist: Rozann Lesches, MD [name], MD ? ?Subjective  ? ?Patient reports that he has had no chest pain and feels his shortness of breath has improved.  ? ?Inpatient Medications  ?  ?Scheduled Meds: ? alum & mag hydroxide-simeth  30 mL Oral Once  ? And  ? lidocaine  15 mL Oral Once  ? amiodarone  100 mg Oral Daily  ? apixaban  5 mg Oral BID  ? vitamin C  500 mg Oral Daily  ? atorvastatin  80 mg Oral QHS  ? Chlorhexidine Gluconate Cloth  6 each Topical Daily  ? clopidogrel  75 mg Oral Daily  ? diltiazem  240 mg Oral Daily  ? fluticasone furoate-vilanterol  1 puff Inhalation Daily  ? furosemide  40 mg Oral Daily  ? guaiFENesin  600 mg Oral BID  ? ipratropium-albuterol  3 mL Nebulization Q6H  ? isosorbide mononitrate  60 mg Oral BID  ? loratadine  10 mg Oral Daily  ? losartan  50 mg Oral Daily  ? methylPREDNISolone (SOLU-MEDROL) injection  80 mg Intravenous Q24H  ? metoprolol tartrate  25 mg Oral BID  ? multivitamin with minerals  1 tablet Oral QHS  ? omega-3 acid ethyl esters  1 g Oral Daily  ? sodium chloride flush  10-40 mL Intracatheter Q12H  ? sodium chloride flush  3 mL Intravenous Q12H  ? sodium chloride flush  3 mL Intravenous Q12H  ? tamsulosin  0.4 mg Oral QHS  ? ?Continuous Infusions: ? sodium chloride    ? cefTRIAXone (ROCEPHIN)  IV 77 mL/hr at 07/27/21 0400  ? ?PRN Meds: ?sodium chloride, acetaminophen, albuterol, ALPRAZolam, guaiFENesin-dextromethorphan, lip balm, magnesium hydroxide, nitroGLYCERIN, ondansetron (ZOFRAN) IV, polyvinyl alcohol, sodium chloride flush, sodium chloride flush, traZODone  ? ?Vital Signs  ?  ?Vitals:  ? 07/27/21 0300 07/27/21 0400 07/27/21 0438 07/27/21 0756  ?BP: 123/68 120/66 125/73   ?Pulse:  61 62   ?Resp:   18   ?Temp:   97.6 ?F (36.4 ?C)   ?TempSrc:   Oral   ?SpO2:  93% 97% 96%  ?Weight:      ?Height:      ? ? ?Intake/Output Summary (Last 24 hours) at  07/27/2021 1145 ?Last data filed at 07/27/2021 0400 ?Gross per 24 hour  ?Intake 484.89 ml  ?Output --  ?Net 484.89 ml  ? ? ?Last 3 Weights 07/26/2021 07/25/2021 07/24/2021  ?Weight (lbs) 172 lb 13.5 oz 169 lb 5 oz 173 lb 8 oz  ?Weight (kg) 78.4 kg 76.8 kg 78.7 kg  ?   ? ?Telemetry  ?  ?Few PVCs otherwise NSR- Personally Reviewed ? ?ECG  ?  ?NSR, 1st degree AV block - Personally Reviewed ? ?Physical Exam  ? ?GEN: No acute distress.   ?Neck: No JVD ?Cardiac: RRR, heart sounds distant, no murmurs or gallops  ?Respiratory: Diffuse expiratory wheezing, breathing comfortably on room air  ?GI: Soft, nontender,  mildly distended  ?MS: No edema; No deformity. ?Neuro:  Nonfocal  ?Psych: Normal affect  ? ?Labs  ?  ? ?Chemistry ?Recent Labs  ?Lab 07/25/21 ?0610 07/26/21 ?0610 07/27/21 ?0603  ?NA 132* 134* 136  ?K 3.6 3.5 3.9  ?CL 101 99 104  ?CO2 24 21* 21*  ?GLUCOSE 145* 230* 207*  ?BUN _0 ?CREATININE 0.79 0.76 0.80  ?CALCIUM 7.8* 8.5* 8.1*  ?PROT 5.1*  --   --   ?  ALBUMIN 2.6*  --   --   ?AST 28  --   --   ?ALT 43  --   --   ?ALKPHOS 59  --   --   ?BILITOT 1.0  --   --   ?GFRNONAA >60 >60 >60  ?ANIONGAP _0 ? ?  ?Lipids  ?Lipid Panel  ?   ?Component Value Date/Time  ? CHOL 128 07/26/2021 1115  ? TRIG 78 07/26/2021 1115  ? HDL 37 (L) 07/26/2021 1115  ? CHOLHDL 3.5 07/26/2021 1115  ? VLDL 16 07/26/2021 1115  ? [place] 75 07/26/2021 1115  ? ?  ?Hematology ?Recent Labs  ?Lab 07/25/21 ?0610 07/26/21 ?0610 07/27/21 ?0603  ?WBC 5.8 8.3 16.7*  ?RBC 3.18* 3.47* 2.97*  ?HGB 9.7* 10.5* 8.9*  ?HCT 27.6* 30.7* 26.2*  ?MCV 86.8 88.5 88.2  ?MCH 30.5 30.3 30.0  ?MCHC 35.1 34.2 34.0  ?RDW 19.3* 19.6* 19.6*  ?PLT 181 177 160  ? ? ? ?BNP ?Recent Labs  ?Lab 07/25/21 ?0610  ?BNP 300.1*  ? ?  ?DDimer No results for input(s): DDIMER in the last 168 hours.  ? ?Radiology  ?  ?CARDIAC CATHETERIZATION ? ?Result Date: 07/26/2021 ?  Prox LAD-1 lesion is 80% stenosed.   Prox LAD-2 lesion is 70% stenosed.   Ost LAD lesion is 30% stenosed.   Ost Cx to  Prox Cx lesion is 100% stenosed.   Prox RCA to Mid RCA lesion is 30% stenosed.   Ost RPDA lesion is 65% stenosed.   1st RPL lesion is 50% stenosed.   Dist RCA-2 lesion is 30% stenosed.   Dist RCA-1 lesion is 90% stenosed.   Non-stenotic Mid RCA lesion was previously treated.   A drug-eluting stent was successfully placed using a STENT ONYX FRONTIER 3.0X15.   Post intervention, there is a 0% residual stenosis.   The left ventricular systolic function is normal.   LV end diastolic pressure is mildly elevated.   The left ventricular ejection fraction is 55-65% by visual estimate. 1.  Severe underlying three-vessel coronary artery disease with known occluded SVG grafts.  These were not engaged today.  Patent LIMA to LAD.  Chronically occluded proximal left circumflex stent with well-developed right to left collaterals.  Patent RCA stents with focal in-stent restenosis distally in the same area that was treated before with balloon angioplasty. 2.  Normal LV systolic function mildly elevated left ventricular end-diastolic pressure. 3.  Successful OCT guided PCI and drug-eluting stent placement to the distal right coronary artery.  OCT showed well-expanded previous stent with no stent fractures and focal neointimal hyperplasia. Recommendations: Eliquis can be resumed tomorrow as long as no bleeding complications. Once Eliquis is resumed, aspirin can be discontinued.  Continue clopidogrel indefinitely as tolerated. Aggressive treatment of risk factors.   ? ?Cardiac Studies  ?LHC 06/10/20 ?1.  Severe underlying three-vessel coronary artery disease with patent LIMA to LAD.  All other grafts are known to be occluded.  Patent stents in the right coronary artery.  However, there is severe focal in-stent restenosis likely in the overlap area of the 2 stents.  I could not determine if there is a few millimeter gap between the 2 stents. ?2.  Normal LV systolic function normal left ventricular end-diastolic pressure. ?3.  Successful  balloon angioplasty to the right coronary artery using a 3.25 noncompliant balloon to 20 atm.  I elected not to add another stent given previously placed stents to avoid multiple layers.  However, if there  is restenosis again in this area, it should be treated with adding another drug-eluting stent. ? ?[place] 07/26/2021 ? ?  Prox LAD-1 lesion is 80% stenosed. ?  Prox LAD-2 lesion is 70% stenosed. ?  Ost LAD lesion is 30% stenosed. ?  Ost Cx to Prox Cx lesion is 100% stenosed. ?  Prox RCA to Mid RCA lesion is 30% stenosed. ?  Ost RPDA lesion is 65% stenosed. ?  1st RPL lesion is 50% stenosed. ?  Dist RCA-2 lesion is 30% stenosed. ?  Dist RCA-1 lesion is 90% stenosed. ?  Non-stenotic Mid RCA lesion was previously treated. ?  A drug-eluting stent was successfully placed using a STENT ONYX FRONTIER 3.0X15. ?  Post intervention, there is a 0% residual stenosis. ?  The left ventricular systolic function is normal. ?  LV end diastolic pressure is mildly elevated. ?  The left ventricular ejection fraction is 55-65% by visual estimate. ?  ?1.  Severe underlying three-vessel coronary artery disease with known occluded SVG grafts.  These were not engaged today.  Patent LIMA to LAD.  Chronically occluded proximal left circumflex stent with well-developed right to left collaterals.  Patent RCA stents with focal in-stent restenosis distally in the same area that was treated before with balloon angioplasty. ?2.  Normal LV systolic function mildly elevated left ventricular end-diastolic pressure. ?3.  Successful OCT guided PCI and drug-eluting stent placement to the distal right coronary artery.  OCT showed well-expanded previous stent with no stent fractures and focal neointimal hyperplasia. ? ?Diagnostic ?Dominance: Right ? ?Intervention ? ? ? ?Patient Profile  ?   ?81 y.o. male M with CAD s/p CABG in 2013 (LIMA to LAD, SVG to PLB, SVG to ramus, SVG to OM) c/b graft failure s/p CABG redo in 2019 (L radial to diagonal, SVG to OM, and  SVG to PDA and PL2) followed by multiple PCI procedures of the RCA (s/p DES x2 01/2020) and most recently underwent angioplasty 05/2020 for in stent re-stenosis of RCA, HLD, PAD, pAFIB, HTN, R thigh sarcoma s/

## 2021-07-27 NOTE — Progress Notes (Signed)
CARDIAC REHAB PHASE I  ? ?Stent education completed with pt. Pt educated on importance of ASA and Eliquis. Pt given heart healthy diet. Reviewed site care, restrictions, and exercise guidelines with emphasis on safety. Will refer to CRP II [place] to meet the requirement, but pt not interested in attending. Pt hopeful to d/c today. ? ?[id number] ?Rufina Falco, RN BSN ?07/27/2021 ?8:58 AM ? ?

## 2021-07-27 NOTE — Plan of Care (Signed)

## 2021-07-27 NOTE — Discharge Summary (Signed)
?Discharge Summary ? ?Eric Beltran. EGB:[id number] DOB: 07-30-1940 ? ?PCP: Celene Squibb, MD ? ?Admit date: 07/24/2021 ?Discharge date: 07/27/2021 ? ?Time spent: 35 minutes. ? ?Recommendations for Outpatient Follow-up:  ?Follow-up with your cardiologist in 1 to 2 weeks. ?Take your medications as prescribed. ?Continue fall precautions. ? ?Discharge Diagnoses:  ?Active Hospital Problems  ? Diagnosis Date Noted  ? Chest pain 07/24/2021  ?  Priority: High  ? Acute bronchitis/reactive airway disease 07/25/2021  ?  Priority: High  ? Paroxysmal atrial fibrillation ([place]) 07/25/2021  ?  Priority: Medium   ? Essential hypertension, benign 07/20/2009  ?  Priority: Medium   ? BPH (benign prostatic hyperplasia) 07/25/2021  ?  Priority: Low  ? Mixed hyperlipidemia 07/20/2009  ?  Priority: Low  ?  ?Resolved Hospital Problems  ? Diagnosis Date Noted Date Resolved  ? Coronary artery disease involving native coronary artery of native heart with angina pectoris ([place]) 07/20/2009 07/25/2021  ?  Priority: Low  ? ? ?Discharge Condition: Stable ? ?Diet recommendation: Resume previous diet, heart healthy diet. ? ?Vitals:  ? 07/27/21 0756 07/27/21 1000  ?BP:  122/65  ?Pulse:  72  ?Resp:  15  ?Temp:    ?SpO2: 96% 96%  ? ? 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  Also reported cough productive of greenish sputum that started on Thursday and got worse on Friday.  Noted low-grade fevers of 100.6 ?F with wheezing. ?  ?Patient was seen in [facility] ED, chest x-ray showed bronchitis.  Troponin initial 17-> 29-> 37, transferred to [facility] for further work-up for  unstable angina. ?  ?3/15: Underwent cardiac cath with PCI to distal RCA. ? ?07/27/2021: Patient was seen and examined at his bedside.  There  were no acute events overnight.  He has no new complaints.  Denies having any chest pain dyspnea or palpitations at the time of this visit.  He is eager to go home.  Okay to discharge home from a cardiac standpoint. ? ?Hospital Course:  ?Principal Problem: ?  Chest pain ?Active Problems: ?  Acute bronchitis/reactive airway disease ?  Essential hypertension, benign ?  Paroxysmal atrial fibrillation (HCC) ?  Mixed hyperlipidemia ?  BPH (benign prostatic hyperplasia) ? ?Resolved chest pain post PCI ?- History of CAD with 4vCABG x2, hypertension, paroxysmal A-fib on Eliquis, dyslipidemia, PAD ?-Troponins negative, appreciate cardiology recommendation ?- underwent cardiac cath, severe 3 vessel CAD with known occluded SVG grafts. Successful PCI to distal RCA. ?Cardiology recommended Eliquis and Plavix indefinitely.  P.o. Protonix 40 mg daily added. ?  ?Acute bronchitis/reactive airway disease ?Denies any chest pain or dyspnea at the time of this visit. ?No new complaints. ?Received 3 days of Rocephin. ?Follow-up with your primary care provider ?  ?Paroxysmal atrial fibrillation (HCC) ?- Rate controlled, continue Lopressor, amiodarone, Cardizem  ?Eliquis for CVA prevention. ?Follow-up with cardiology ?  ?Essential hypertension, benign ?- Continue antihypertensives, BP stable ?  ?BPH (benign prostatic hyperplasia) ?- Continue Flomax, Proscar ?  ?Mixed hyperlipidemia ?- Continue statin ?  ?Coronary artery disease involving native coronary artery of native heart with angina pectoris (HCC)-resolved as of 07/25/2021 ?- Management as above. ?Follow-up with cardiology. ?  ?  ?Code Status: full code  ? ? ? ?Procedures:  ?Left heart cardiac cath  ?  ?  Consultants:   ?Cardiology  ?  ?Antimicrobials:  ?  ?Anti-infectives (From admission, onward)  ?  ?  ?  Start     Dose/Rate Route Frequency Ordered Stop  ?  07/25/21 0230   cefTRIAXone (ROCEPHIN) 1 g in sodium chloride 0.9 % 100 mL IVPB       ? 1 g ?200 mL/hr over 30 Minutes Intravenous  Daily at bedtime 07/25/21 0227    ?  ?   ?  ?  ? ? ? ?Discharge Exam: ?BP 122/65   Pulse 72   Temp 97.6 ?F (36.4 ?C) (Oral)   Resp 15   Ht '5\' 7"'$  (1.702 m)   Wt 78.4 kg   SpO2 96%   BMI 27.07 kg/m?  ?General: 81 y.o. year-old male well developed well nourished in no acute distress.  Alert and oriented x3. ?Cardiovascular: Regular rate and rhythm with no rubs or gallops.  No thyromegaly or JVD noted.   ?Respiratory: Clear to auscultation with no wheezes or rales. Good inspiratory effort. ?Abdomen: Soft nontender nondistended with normal bowel sounds x4 quadrants. ?Psychiatry: Mood is appropriate for condition and setting ? ?Discharge Instructions ?You were cared for by a hospitalist during your hospital stay. If you have any questions about your discharge medications or the care you received while you were in the hospital after you are discharged, you can call the unit and asked to speak with the hospitalist on call if the hospitalist that took care of you is not available. Once you are discharged, your primary care physician will handle any further medical issues. Please note that NO REFILLS for any discharge medications will be authorized once you are discharged, as it is imperative that you return to your primary care physician (or establish a relationship with a primary care physician if you do not have one) for your aftercare needs so that they can reassess your need for medications and monitor your lab values. ? ?Discharge Instructions   ? ? Amb Referral to Cardiac Rehabilitation   Complete by: As directed ?  ? Diagnosis: Coronary Stents  ? After initial evaluation and assessments completed: Virtual Based Care may be provided alone or in conjunction with Phase 2 Cardiac Rehab based on patient barriers.: Yes  ? ?  ? ?Allergies as of 07/27/2021   ? ?   Reactions  ? Chocolate Flavor Other (See Comments)  ? Stuffy nose   ? ?  ? ?  ?Medication List  ?  ? ?TAKE these medications   ? ?amiodarone 200 MG  tablet ?Commonly known as: PACERONE ?TAKE 1/2 TABLET BY MOUTH DAILY (cut in half) ?What changed: See the new instructions. ?  ?atorvastatin 80 MG tablet ?Commonly known as: LIPITOR ?TAKE 1 TABLET BY MOUTH AT BEDTIME ?What changed: when to take this ?  ?carboxymethylcellulose 0.5 % Soln ?Commonly known as: REFRESH PLUS ?Place 1 drop into both eyes daily as needed (Dry eyes). ?  ?clopidogrel 75 MG tablet ?Commonly known as: PLAVIX ?TAKE 1 TABLET BY MOUTH DAILY WITH BREAKFAST ?What changed: when to take this ?  ?dexamethasone 4 MG tablet ?Commonly known as: DECADRON ?Take 8 mg by mouth as directed. Take the day before, the evening of treatment And day after docetaxel ?  ?diltiazem 240 MG 24 hr capsule ?Commonly known as: CARDIZEM CD ?TAKE ONE CAPSULE BY MOUTH DAILY ?What changed: how to take this ?  ?Eliquis 5 MG Tabs tablet ?Generic drug: apixaban ?TAKE 1 TABLET BY MOUTH TWICE DAILY ?What changed: how much  to take ?  ?Fish Oil 1000 MG Caps ?Take 2,000 mg by mouth daily. ?  ?furosemide 20 MG tablet ?Commonly known as: LASIX ?TAKE TWO TABLETS BY MOUTH DAILY ?  ?isosorbide mononitrate 60 MG 24 hr tablet ?Commonly known as: IMDUR ?TAKE 1 TABLET BY MOUTH TWICE DAILY every morning AND in THE evening ?What changed: See the new instructions. ?  ?losartan 50 MG tablet ?Commonly known as: COZAAR ?Take 1 tablet (50 mg total) by mouth daily. ?  ?metoprolol tartrate 25 MG tablet ?Commonly known as: LOPRESSOR ?Take 25 mg by mouth 2 (two) times daily. ?  ?multivitamin tablet ?Take 1 tablet by mouth at bedtime. ?  ?nitroGLYCERIN 0.4 MG SL tablet ?Commonly known as: NITROSTAT ?DISSOLVE 1 TABLET UNDER THE TONGUE EVERY 5 MINUTES AS NEEDED FOR CHEST PAIN. DO NOT EXCEED A TOTAL OF 3 DOSES IN 15 MINUTES. ?What changed: See the new instructions. ?  ?ondansetron 8 MG tablet ?Commonly known as: ZOFRAN ?Take 8 mg by mouth every 8 (eight) hours as needed for nausea or vomiting. ?  ?oxyCODONE 5 MG immediate release tablet ?Commonly known as: Oxy  IR/ROXICODONE ?Take 5 mg by mouth every 4 (four) hours as needed for pain. ?  ?pantoprazole 40 MG tablet ?Commonly known as: PROTONIX ?Take 1 tablet (40 mg total) by mouth daily. ?  ?prochlorperazine 10 MG tablet ?Com

## 2021-07-28 ENCOUNTER — Other Ambulatory Visit: Payer: Self-pay | Admitting: Cardiology

## 2021-08-04 DIAGNOSIS — J019 Acute sinusitis, unspecified: Secondary | ICD-10-CM | POA: Diagnosis not present

## 2021-08-04 DIAGNOSIS — Z955 Presence of coronary angioplasty implant and graft: Secondary | ICD-10-CM | POA: Diagnosis not present

## 2021-08-04 DIAGNOSIS — J209 Acute bronchitis, unspecified: Secondary | ICD-10-CM | POA: Diagnosis not present

## 2021-08-04 DIAGNOSIS — I480 Paroxysmal atrial fibrillation: Secondary | ICD-10-CM | POA: Diagnosis not present

## 2021-08-07 ENCOUNTER — Ambulatory Visit: Payer: Medicare Other | Admitting: Physician Assistant

## 2021-08-07 ENCOUNTER — Ambulatory Visit: Admit: 2021-08-07 | Discharge: 2021-08-07 | Payer: MEDICARE

## 2021-08-07 ENCOUNTER — Encounter: Admit: 2021-08-07 | Discharge: 2021-08-07 | Payer: MEDICARE

## 2021-08-07 DIAGNOSIS — G893 Neoplasm related pain (acute) (chronic): Secondary | ICD-10-CM | POA: Diagnosis not present

## 2021-08-07 DIAGNOSIS — C7802 Secondary malignant neoplasm of left lung: Secondary | ICD-10-CM | POA: Diagnosis not present

## 2021-08-07 DIAGNOSIS — C7801 Secondary malignant neoplasm of right lung: Secondary | ICD-10-CM | POA: Diagnosis not present

## 2021-08-08 ENCOUNTER — Telehealth: Admit: 2021-08-08 | Discharge: 2021-08-08 | Payer: MEDICARE

## 2021-08-08 ENCOUNTER — Ambulatory Visit: Admit: 2021-08-08 | Discharge: 2021-08-08 | Payer: MEDICARE

## 2021-08-08 DIAGNOSIS — Z5111 Encounter for antineoplastic chemotherapy: Secondary | ICD-10-CM | POA: Diagnosis not present

## 2021-08-08 DIAGNOSIS — J40 Bronchitis, not specified as acute or chronic: Secondary | ICD-10-CM | POA: Diagnosis not present

## 2021-08-08 DIAGNOSIS — C499 Malignant neoplasm of connective and soft tissue, unspecified: Secondary | ICD-10-CM | POA: Diagnosis not present

## 2021-08-08 DIAGNOSIS — Z1159 Encounter for screening for other viral diseases: Secondary | ICD-10-CM | POA: Diagnosis not present

## 2021-08-08 DIAGNOSIS — R918 Other nonspecific abnormal finding of lung field: Secondary | ICD-10-CM | POA: Diagnosis not present

## 2021-08-08 DIAGNOSIS — I2510 Atherosclerotic heart disease of native coronary artery without angina pectoris: Secondary | ICD-10-CM | POA: Diagnosis not present

## 2021-08-08 DIAGNOSIS — I480 Paroxysmal atrial fibrillation: Secondary | ICD-10-CM | POA: Diagnosis not present

## 2021-08-08 DIAGNOSIS — I712 Thoracic aortic aneurysm, without rupture, unspecified: Secondary | ICD-10-CM | POA: Diagnosis not present

## 2021-08-14 ENCOUNTER — Ambulatory Visit: Admit: 2021-08-14 | Discharge: 2021-08-14 | Payer: MEDICARE

## 2021-08-14 ENCOUNTER — Encounter: Admit: 2021-08-14 | Discharge: 2021-08-14 | Payer: MEDICARE

## 2021-08-14 DIAGNOSIS — C7801 Secondary malignant neoplasm of right lung: Secondary | ICD-10-CM | POA: Diagnosis not present

## 2021-08-14 DIAGNOSIS — C499 Malignant neoplasm of connective and soft tissue, unspecified: Principal | ICD-10-CM

## 2021-08-14 DIAGNOSIS — C7802 Secondary malignant neoplasm of left lung: Principal | ICD-10-CM

## 2021-08-15 ENCOUNTER — Ambulatory Visit: Admit: 2021-08-15 | Discharge: 2021-08-16 | Payer: MEDICARE

## 2021-08-15 DIAGNOSIS — Z1159 Encounter for screening for other viral diseases: Secondary | ICD-10-CM | POA: Diagnosis not present

## 2021-08-15 DIAGNOSIS — C499 Malignant neoplasm of connective and soft tissue, unspecified: Secondary | ICD-10-CM | POA: Diagnosis not present

## 2021-08-15 DIAGNOSIS — R918 Other nonspecific abnormal finding of lung field: Principal | ICD-10-CM

## 2021-08-15 DIAGNOSIS — Z5111 Encounter for antineoplastic chemotherapy: Secondary | ICD-10-CM | POA: Diagnosis not present

## 2021-08-16 ENCOUNTER — Ambulatory Visit: Admit: 2021-08-16 | Discharge: 2021-08-17 | Payer: MEDICARE

## 2021-08-16 DIAGNOSIS — Z1159 Encounter for screening for other viral diseases: Secondary | ICD-10-CM | POA: Diagnosis not present

## 2021-08-16 DIAGNOSIS — Z7689 Persons encountering health services in other specified circumstances: Secondary | ICD-10-CM | POA: Diagnosis not present

## 2021-08-16 DIAGNOSIS — R918 Other nonspecific abnormal finding of lung field: Secondary | ICD-10-CM | POA: Diagnosis not present

## 2021-08-16 DIAGNOSIS — C499 Malignant neoplasm of connective and soft tissue, unspecified: Principal | ICD-10-CM

## 2021-08-21 ENCOUNTER — Ambulatory Visit: Admit: 2021-08-21 | Discharge: 2021-08-22 | Payer: MEDICARE

## 2021-08-24 ENCOUNTER — Emergency Department: Admit: 2021-08-24 | Discharge: 2021-08-24 | Disposition: A | Discharge status: Home / Self Care | Payer: MEDICARE

## 2021-08-24 ENCOUNTER — Ambulatory Visit: Admit: 2021-08-24 | Discharge: 2021-08-24 | Disposition: A | Discharge status: Home / Self Care | Payer: MEDICARE

## 2021-08-24 DIAGNOSIS — Z85118 Personal history of other malignant neoplasm of bronchus and lung: Secondary | ICD-10-CM | POA: Diagnosis not present

## 2021-08-24 DIAGNOSIS — R58 Hemorrhage, not elsewhere classified: Secondary | ICD-10-CM | POA: Diagnosis not present

## 2021-08-24 DIAGNOSIS — C3490 Malignant neoplasm of unspecified part of unspecified bronchus or lung: Secondary | ICD-10-CM | POA: Diagnosis not present

## 2021-08-24 DIAGNOSIS — M7981 Nontraumatic hematoma of soft tissue: Secondary | ICD-10-CM | POA: Diagnosis not present

## 2021-08-24 DIAGNOSIS — M7989 Other specified soft tissue disorders: Secondary | ICD-10-CM | POA: Diagnosis not present

## 2021-08-24 DIAGNOSIS — S40022A Contusion of left upper arm, initial encounter: Principal | ICD-10-CM

## 2021-08-28 ENCOUNTER — Other Ambulatory Visit: Payer: Self-pay | Admitting: Cardiology

## 2021-08-28 ENCOUNTER — Ambulatory Visit: Admit: 2021-08-28 | Discharge: 2021-08-28 | Payer: MEDICARE

## 2021-08-28 ENCOUNTER — Encounter: Admit: 2021-08-28 | Discharge: 2021-08-28 | Payer: MEDICARE

## 2021-08-28 DIAGNOSIS — C499 Malignant neoplasm of connective and soft tissue, unspecified: Secondary | ICD-10-CM | POA: Diagnosis not present

## 2021-08-28 DIAGNOSIS — Z1159 Encounter for screening for other viral diseases: Secondary | ICD-10-CM | POA: Diagnosis not present

## 2021-08-28 DIAGNOSIS — R918 Other nonspecific abnormal finding of lung field: Secondary | ICD-10-CM | POA: Diagnosis not present

## 2021-08-28 DIAGNOSIS — C7802 Secondary malignant neoplasm of left lung: Secondary | ICD-10-CM | POA: Diagnosis not present

## 2021-08-28 DIAGNOSIS — C7801 Secondary malignant neoplasm of right lung: Secondary | ICD-10-CM | POA: Diagnosis not present

## 2021-08-28 NOTE — Telephone Encounter (Signed)
Prescription refill request for Eliquis received. ?Indication: PAF ?Last office visit: 06/13/21  Myles Gip MD ?Scr: 1.01 on 08/24/21 ?Age: 81 ?Weight: 86.6kg ? ?Based on above findings Eliquis '5mg'$  twice daily is the appropriate dose.  Refill approved. ? ?

## 2021-08-29 ENCOUNTER — Ambulatory Visit: Admit: 2021-08-29 | Discharge: 2021-08-30 | Payer: MEDICARE

## 2021-08-29 ENCOUNTER — Telehealth: Admit: 2021-08-29 | Discharge: 2021-08-30 | Payer: MEDICARE

## 2021-08-29 DIAGNOSIS — Z5111 Encounter for antineoplastic chemotherapy: Secondary | ICD-10-CM | POA: Diagnosis not present

## 2021-08-29 DIAGNOSIS — I25119 Atherosclerotic heart disease of native coronary artery with unspecified angina pectoris: Secondary | ICD-10-CM | POA: Diagnosis not present

## 2021-08-29 DIAGNOSIS — D696 Thrombocytopenia, unspecified: Secondary | ICD-10-CM | POA: Diagnosis not present

## 2021-08-29 DIAGNOSIS — R918 Other nonspecific abnormal finding of lung field: Secondary | ICD-10-CM | POA: Diagnosis not present

## 2021-08-29 DIAGNOSIS — Z1159 Encounter for screening for other viral diseases: Secondary | ICD-10-CM | POA: Diagnosis not present

## 2021-08-29 DIAGNOSIS — C7801 Secondary malignant neoplasm of right lung: Principal | ICD-10-CM

## 2021-08-29 DIAGNOSIS — C499 Malignant neoplasm of connective and soft tissue, unspecified: Principal | ICD-10-CM

## 2021-08-29 DIAGNOSIS — C7802 Secondary malignant neoplasm of left lung: Principal | ICD-10-CM

## 2021-08-29 DIAGNOSIS — I712 Thoracic aortic aneurysm without rupture, unspecified part (CMS-HCC): Principal | ICD-10-CM

## 2021-08-29 DIAGNOSIS — I2510 Atherosclerotic heart disease of native coronary artery without angina pectoris: Principal | ICD-10-CM

## 2021-08-29 DIAGNOSIS — C800 Disseminated malignant neoplasm, unspecified: Principal | ICD-10-CM

## 2021-08-29 DIAGNOSIS — Z951 Presence of aortocoronary bypass graft: Principal | ICD-10-CM

## 2021-08-29 DIAGNOSIS — S40022A Contusion of left upper arm, initial encounter: Principal | ICD-10-CM

## 2021-08-29 DIAGNOSIS — I480 Paroxysmal atrial fibrillation: Principal | ICD-10-CM

## 2021-08-29 DIAGNOSIS — G893 Neoplasm related pain (acute) (chronic): Principal | ICD-10-CM

## 2021-08-29 DIAGNOSIS — Z89619 Acquired absence of unspecified leg above knee: Principal | ICD-10-CM

## 2021-08-29 DIAGNOSIS — I209 Angina pectoris, unspecified: Principal | ICD-10-CM

## 2021-09-01 ENCOUNTER — Other Ambulatory Visit: Payer: Self-pay | Admitting: Cardiology

## 2021-09-04 ENCOUNTER — Ambulatory Visit: Admit: 2021-09-04 | Discharge: 2021-09-04 | Payer: MEDICARE

## 2021-09-04 ENCOUNTER — Encounter: Admit: 2021-09-04 | Discharge: 2021-09-04 | Payer: MEDICARE

## 2021-09-04 DIAGNOSIS — R918 Other nonspecific abnormal finding of lung field: Secondary | ICD-10-CM | POA: Diagnosis not present

## 2021-09-04 DIAGNOSIS — Z1159 Encounter for screening for other viral diseases: Secondary | ICD-10-CM | POA: Diagnosis not present

## 2021-09-04 DIAGNOSIS — C499 Malignant neoplasm of connective and soft tissue, unspecified: Secondary | ICD-10-CM | POA: Diagnosis not present

## 2021-09-05 ENCOUNTER — Ambulatory Visit: Admit: 2021-09-05 | Discharge: 2021-09-06 | Payer: MEDICARE

## 2021-09-05 DIAGNOSIS — R918 Other nonspecific abnormal finding of lung field: Secondary | ICD-10-CM | POA: Diagnosis not present

## 2021-09-05 DIAGNOSIS — Z7952 Long term (current) use of systemic steroids: Secondary | ICD-10-CM | POA: Diagnosis not present

## 2021-09-05 DIAGNOSIS — C499 Malignant neoplasm of connective and soft tissue, unspecified: Secondary | ICD-10-CM | POA: Diagnosis not present

## 2021-09-05 DIAGNOSIS — Z5111 Encounter for antineoplastic chemotherapy: Secondary | ICD-10-CM | POA: Diagnosis not present

## 2021-09-05 DIAGNOSIS — Z1159 Encounter for screening for other viral diseases: Principal | ICD-10-CM

## 2021-09-06 ENCOUNTER — Ambulatory Visit: Admit: 2021-09-06 | Discharge: 2021-09-07 | Payer: MEDICARE

## 2021-09-06 DIAGNOSIS — C499 Malignant neoplasm of connective and soft tissue, unspecified: Secondary | ICD-10-CM | POA: Diagnosis not present

## 2021-09-06 DIAGNOSIS — R918 Other nonspecific abnormal finding of lung field: Secondary | ICD-10-CM | POA: Diagnosis not present

## 2021-09-06 DIAGNOSIS — Z1159 Encounter for screening for other viral diseases: Secondary | ICD-10-CM | POA: Diagnosis not present

## 2021-09-06 DIAGNOSIS — Z7689 Persons encountering health services in other specified circumstances: Secondary | ICD-10-CM | POA: Diagnosis not present

## 2021-09-07 ENCOUNTER — Other Ambulatory Visit: Payer: Medicare Other

## 2021-09-10 DIAGNOSIS — C3491 Malignant neoplasm of unspecified part of right bronchus or lung: Secondary | ICD-10-CM | POA: Diagnosis not present

## 2021-09-10 DIAGNOSIS — Z951 Presence of aortocoronary bypass graft: Secondary | ICD-10-CM | POA: Diagnosis not present

## 2021-09-10 DIAGNOSIS — Z7289 Other problems related to lifestyle: Secondary | ICD-10-CM | POA: Diagnosis not present

## 2021-09-10 DIAGNOSIS — Z91018 Allergy to other foods: Secondary | ICD-10-CM | POA: Diagnosis not present

## 2021-09-10 DIAGNOSIS — E7800 Pure hypercholesterolemia, unspecified: Secondary | ICD-10-CM | POA: Diagnosis not present

## 2021-09-10 DIAGNOSIS — N400 Enlarged prostate without lower urinary tract symptoms: Secondary | ICD-10-CM | POA: Diagnosis not present

## 2021-09-10 DIAGNOSIS — M1990 Unspecified osteoarthritis, unspecified site: Secondary | ICD-10-CM | POA: Diagnosis not present

## 2021-09-10 DIAGNOSIS — I10 Essential (primary) hypertension: Secondary | ICD-10-CM | POA: Diagnosis not present

## 2021-09-10 DIAGNOSIS — R079 Chest pain, unspecified: Secondary | ICD-10-CM | POA: Diagnosis not present

## 2021-09-10 DIAGNOSIS — R002 Palpitations: Secondary | ICD-10-CM | POA: Diagnosis not present

## 2021-09-10 DIAGNOSIS — C3492 Malignant neoplasm of unspecified part of left bronchus or lung: Secondary | ICD-10-CM | POA: Diagnosis not present

## 2021-09-10 DIAGNOSIS — Z955 Presence of coronary angioplasty implant and graft: Secondary | ICD-10-CM | POA: Diagnosis not present

## 2021-09-11 ENCOUNTER — Ambulatory Visit: Payer: Medicare Other | Admitting: Cardiology

## 2021-09-11 ENCOUNTER — Emergency Department: Admit: 2021-09-11 | Discharge: 2021-09-11 | Disposition: A | Discharge status: Home / Self Care | Payer: MEDICARE

## 2021-09-11 ENCOUNTER — Ambulatory Visit: Admit: 2021-09-11 | Discharge: 2021-09-11 | Disposition: A | Discharge status: Home / Self Care | Payer: MEDICARE

## 2021-09-11 DIAGNOSIS — R002 Palpitations: Secondary | ICD-10-CM | POA: Diagnosis not present

## 2021-09-11 DIAGNOSIS — E7800 Pure hypercholesterolemia, unspecified: Secondary | ICD-10-CM | POA: Diagnosis not present

## 2021-09-11 DIAGNOSIS — R079 Chest pain, unspecified: Secondary | ICD-10-CM | POA: Diagnosis not present

## 2021-09-11 DIAGNOSIS — I10 Essential (primary) hypertension: Secondary | ICD-10-CM | POA: Diagnosis not present

## 2021-09-12 ENCOUNTER — Ambulatory Visit (INDEPENDENT_AMBULATORY_CARE_PROVIDER_SITE_OTHER): Payer: Medicare Other | Admitting: Cardiology

## 2021-09-12 ENCOUNTER — Encounter: Payer: Self-pay | Admitting: Cardiology

## 2021-09-12 ENCOUNTER — Encounter (HOSPITAL_COMMUNITY): Payer: Self-pay | Admitting: Emergency Medicine

## 2021-09-12 ENCOUNTER — Emergency Department (HOSPITAL_COMMUNITY)
Admission: EM | Admit: 2021-09-12 | Discharge: 2021-09-13 | Disposition: A | Discharge status: Home / Self Care | Payer: Medicare Other | Attending: Emergency Medicine | Admitting: Emergency Medicine

## 2021-09-12 ENCOUNTER — Other Ambulatory Visit: Payer: Self-pay

## 2021-09-12 ENCOUNTER — Emergency Department (HOSPITAL_COMMUNITY): Payer: Medicare Other

## 2021-09-12 VITALS — BP 138/82 | HR 147 | Ht 67.0 in | Wt 182.0 lb

## 2021-09-12 DIAGNOSIS — I25119 Atherosclerotic heart disease of native coronary artery with unspecified angina pectoris: Secondary | ICD-10-CM | POA: Diagnosis not present

## 2021-09-12 DIAGNOSIS — Z7901 Long term (current) use of anticoagulants: Secondary | ICD-10-CM | POA: Diagnosis not present

## 2021-09-12 DIAGNOSIS — D72829 Elevated white blood cell count, unspecified: Secondary | ICD-10-CM | POA: Diagnosis not present

## 2021-09-12 DIAGNOSIS — R079 Chest pain, unspecified: Secondary | ICD-10-CM | POA: Diagnosis not present

## 2021-09-12 DIAGNOSIS — R000 Tachycardia, unspecified: Secondary | ICD-10-CM | POA: Diagnosis not present

## 2021-09-12 DIAGNOSIS — R002 Palpitations: Secondary | ICD-10-CM | POA: Insufficient documentation

## 2021-09-12 DIAGNOSIS — I480 Paroxysmal atrial fibrillation: Secondary | ICD-10-CM

## 2021-09-12 DIAGNOSIS — R0789 Other chest pain: Secondary | ICD-10-CM | POA: Diagnosis not present

## 2021-09-12 DIAGNOSIS — I483 Typical atrial flutter: Secondary | ICD-10-CM

## 2021-09-12 LAB — CBC WITH DIFFERENTIAL/PLATELET
Band Neutrophils: 16 %
Basophils Absolute: 0.6 10*3/uL — ABNORMAL HIGH (ref 0.0–0.1)
Basophils Relative: 1 %
Blasts: 1 %
Eosinophils Absolute: 0 10*3/uL (ref 0.0–0.5)
Eosinophils Relative: 0 %
HCT: 30.5 % — ABNORMAL LOW (ref 39.0–52.0)
Hemoglobin: 10 g/dL — ABNORMAL LOW (ref 13.0–17.0)
Lymphocytes Relative: 3 %
Lymphs Abs: 1.7 10*3/uL (ref 0.7–4.0)
MCH: 30.5 pg (ref 26.0–34.0)
MCHC: 32.8 g/dL (ref 30.0–36.0)
MCV: 93 fL (ref 80.0–100.0)
Metamyelocytes Relative: 5 %
Monocytes Absolute: 3.4 10*3/uL — ABNORMAL HIGH (ref 0.1–1.0)
Monocytes Relative: 6 %
Myelocytes: 1 %
Neutro Abs: 47.3 10*3/uL — ABNORMAL HIGH (ref 1.7–7.7)
Neutrophils Relative %: 67 %
Platelets: 70 10*3/uL — ABNORMAL LOW (ref 150–400)
RBC: 3.28 MIL/uL — ABNORMAL LOW (ref 4.22–5.81)
RDW: 19.1 % — ABNORMAL HIGH (ref 11.5–15.5)
WBC: 57 10*3/uL (ref 4.0–10.5)
nRBC: 0.4 % — ABNORMAL HIGH (ref 0.0–0.2)

## 2021-09-12 LAB — CBG MONITORING, ED: Glucose-Capillary: 295 mg/dL — ABNORMAL HIGH (ref 70–99)

## 2021-09-12 LAB — BASIC METABOLIC PANEL
Anion gap: 14 (ref 5–15)
BUN: 23 mg/dL (ref 8–23)
CO2: 20 mmol/L — ABNORMAL LOW (ref 22–32)
Calcium: 9.3 mg/dL (ref 8.9–10.3)
Chloride: 101 mmol/L (ref 98–111)
Creatinine, Ser: 0.84 mg/dL (ref 0.61–1.24)
GFR, Estimated: >60 mL/min (ref >60)
Glucose, Bld: 302 mg/dL — ABNORMAL HIGH (ref 70–99)
Potassium: 4.1 mmol/L (ref 3.5–5.1)
Sodium: 135 mmol/L (ref 135–145)

## 2021-09-12 LAB — TROPONIN I (HIGH SENSITIVITY)
Troponin I (High Sensitivity): 23 ng/L — ABNORMAL HIGH (ref <18)
Troponin I (High Sensitivity): 41 ng/L — ABNORMAL HIGH (ref <18)
Troponin I (High Sensitivity): 55 ng/L — ABNORMAL HIGH (ref <18)

## 2021-09-12 LAB — PROTIME-INR
INR: 1.2 (ref 0.8–1.2)
Prothrombin Time: 14.9 seconds (ref 11.4–15.2)

## 2021-09-12 LAB — MAGNESIUM: Magnesium: 1.9 mg/dL (ref 1.7–2.4)

## 2021-09-12 MED ORDER — ADENOSINE 6 MG/2ML IV SOLN: 6.0000 mg | Freq: Once | INTRAVENOUS | Status: DC

## 2021-09-12 MED ORDER — LACTATED RINGERS IV SOLN: INTRAVENOUS | Status: DC

## 2021-09-12 MED ORDER — ADENOSINE 6 MG/2ML IV SOLN
INTRAVENOUS | Status: AC
  Filled 2021-09-12: qty 4

## 2021-09-12 MED ORDER — LACTATED RINGERS IV BOLUS
500.0000 mL | Freq: Once | INTRAVENOUS | Status: AC
  Administered 2021-09-12: 500 mL via INTRAVENOUS

## 2021-09-12 MED ORDER — ADENOSINE 6 MG/2ML IV SOLN: 12.0000 mg | Freq: Once | INTRAVENOUS | Status: DC | PRN

## 2021-09-12 MED ORDER — ADENOSINE 6 MG/2ML IV SOLN
INTRAVENOUS | Status: AC
  Filled 2021-09-12: qty 2

## 2021-09-12 MED ORDER — ETOMIDATE 2 MG/ML IV SOLN: 0.1000 mg/kg | Freq: Once | INTRAVENOUS | Status: DC | PRN

## 2021-09-12 MED ORDER — LACTATED RINGERS IV BOLUS
250.0000 mL | Freq: Once | INTRAVENOUS | Status: AC
Start: 2021-09-12 — End: 2021-09-12
  Administered 2021-09-12: 250 mL via INTRAVENOUS

## 2021-09-12 NOTE — Patient Instructions (Addendum)
Medication Instructions:  ?Your physician recommends that you continue on your current medications as directed. Please refer to the Current Medication list given to you today. ? ?Labwork: ?none ? ?Testing/Procedures: ?none ? ?Follow-Up: ?Your physician recommends that you schedule a follow-up appointment in: 3 months ? ?Any Other Special Instructions Will Be Listed Below (If Applicable). ?Go to [facility] Emergency Room now for an evaluation ? ?If you need a refill on your cardiac medications before your next appointment, please call your pharmacy. ?

## 2021-09-12 NOTE — ED Notes (Signed)
Date and time results received: 09/12/21 1857 ?(use smartphrase ".now" to insert current time) ? ?Test: WBC ?Critical Value: 56.72 ? ?Name of Provider Notified: Dr. Doren Custard ? ?Orders Received? Or Actions Taken?: no/na ?

## 2021-09-12 NOTE — Progress Notes (Signed)
? ? ?Cardiology Office Note ? ?Date: 09/12/2021  ? ?ID: Eric Beltran., DOB [birth year], MRN [id number] ? ?PCP:  Celene Squibb, MD  ?Cardiologist:  Rozann Lesches, MD ?Electrophysiologist:  None  ? ?Chief Complaint  ?Patient presents with  ? Cardiac follow-up  ? ? ?History of Present Illness: ?Eric Beltran. is a 81 y.o. male last seen in January.  He presents for a follow-up visit.  I reviewed interval records including hospitalization in March.  He underwent follow-up cardiac catheterization at that time with successful PCI to the distal RCA as outlined below. ? ?He states that since this weekend he has had a faster than normal resting heart rate, 80s to 90s.  Some intermittent angina as well.  He was seen in the ER at [facility] within the last 48 hours, I reviewed the records.  Reportedly in sinus rhythm and heart rate recorded in the 80s.  High-sensitivity troponin I levels were also negative for ACS.  Situation discussed by phone with someone at [facility], he ultimately discharged home to follow-up with me as an outpatient. ? ?He states that around 230 this afternoon he noticed a much faster heartbeat, put on his pulse oximeter finding his heart rate to be in the 150s.  No active angina, he came to the office for further evaluation late this afternoon. ? ?I personally reviewed his ECG which shows probable atrial flutter at 152 bpm with right bundle branch block and left anterior fascicular block.  He has a history of atrial fibrillation and SVT, is on amiodarone at baseline and also Eliquis.  He states that he has been compliant with Eliquis since his cardiac catheterization in March. ? ?I did do a carotid sinus massage in the office without any change in his heart rate.  A friend brought him to the office today.  He is otherwise hemodynamically stable. ? ?Past Medical History:  ?Diagnosis Date  ? Arthritis   ? Ascending aortic aneurysm ([place])   ? Status post repair 12/2011 at [facility]  ? BPH  (benign prostatic hyperplasia)   ? Chronic diastolic CHF (congestive heart failure) ([place])   ? Coronary atherosclerosis of native coronary artery   ? a. Multivessel status post prior stenting and ultimately CABG 12/2011 at [facility] with with LIMA to LAD, SVG to PLB, SVG to ramus, SVG to OM . b. Last cath 06/2014 (following ischemic nuc) -> medical therapy, no feasible way to revascularize LCx territory.  ? Essential hypertension   ? Mixed hyperlipidemia   ? Myocardial infarction [facility])   ? PAD (peripheral artery disease) ([place])   ? PAF (paroxysmal atrial fibrillation) ([place])   ? Supraventricular tachycardia ([place])   ? ? ?Past Surgical History:  ?Procedure Laterality Date  ? ASCENDING AORTIC ANEURYSM REPAIR  12/2011 [facility]  ? 26 mm.Dacron graft  ? CORONARY ARTERY BYPASS GRAFT  12/2011 [facility]  ? LIMA to LAD, SVG to PLB, SVG to ramus, SVG to OM  ? CORONARY ARTERY BYPASS GRAFT N/A 08/06/2017  ? Procedure: REDO CORONARY ARTERY BYPASS GRAFTING (CABG) TIMES FOUR UTILIZING LEFT GREATER SAPHENOUS VEIN HAVESTED ENDOVASCULARLY, LEFT RADIAL ARTERY.  RIGHT AXILLARY CANNULATION;  Surgeon: Gaye Pollack, MD;  Location: [place];  Service: Open Heart Surgery;  Laterality: N/A;  ? CORONARY ATHERECTOMY N/A 02/10/2020  ? Procedure: CORONARY ATHERECTOMY;  Surgeon: [name], MD;  Location: [place] CV LAB;  Service: Cardiovascular;  Laterality: N/A;  ? CORONARY BALLOON ANGIOPLASTY N/A 06/10/2020  ? Procedure: CORONARY BALLOON  ANGIOPLASTY;  Surgeon: Wellington Hampshire, MD;  Location: [place] CV LAB;  Service: Cardiovascular;  Laterality: N/A;  ? CORONARY STENT INTERVENTION N/A 02/10/2020  ? Procedure: CORONARY STENT INTERVENTION;  Surgeon: [name], MD;  Location: [place] CV LAB;  Service: Cardiovascular;  Laterality: N/A;  ? CORONARY STENT INTERVENTION N/A 07/26/2021  ? Procedure: CORONARY STENT INTERVENTION;  Surgeon: Wellington Hampshire, MD;  Location: [place] CV LAB;  Service: Cardiovascular;  Laterality: N/A;  RCA  ? HERNIA  REPAIR    ? INTRAVASCULAR ULTRASOUND/IVUS N/A 07/26/2021  ? Procedure: Intravascular Ultrasound/IVUS;  Surgeon: Wellington Hampshire, MD;  Location: [place] [facility] LAB;  Service: Cardiovascular;  Laterality: N/A;  OCT  ? LEFT HEART CATH AND CORS/GRAFTS ANGIOGRAPHY N/A 07/11/2017  ? Procedure: LEFT HEART CATH AND CORS/GRAFTS ANGIOGRAPHY;  Surgeon: Troy Sine, MD;  Location: [place] CV LAB;  Service: Cardiovascular;  Laterality: N/A;  ? LEFT HEART CATH AND CORS/GRAFTS ANGIOGRAPHY N/A 09/12/2018  ? Procedure: LEFT HEART CATH AND CORS/GRAFTS ANGIOGRAPHY;  Surgeon: Troy Sine, MD;  Location: [place] CV LAB;  Service: Cardiovascular;  Laterality: N/A;  ? LEFT HEART CATH AND CORS/GRAFTS ANGIOGRAPHY N/A 02/10/2020  ? Procedure: LEFT HEART CATH AND CORS/GRAFTS ANGIOGRAPHY;  Surgeon: [name], MD;  Location: [place] CV LAB;  Service: Cardiovascular;  Laterality: N/A;  ? LEFT HEART CATH AND CORS/GRAFTS ANGIOGRAPHY N/A 06/10/2020  ? Procedure: LEFT HEART CATH AND CORS/GRAFTS ANGIOGRAPHY;  Surgeon: Wellington Hampshire, MD;  Location: [place] CV LAB;  Service: Cardiovascular;  Laterality: N/A;  ? LEFT HEART CATH AND CORS/GRAFTS ANGIOGRAPHY N/A 07/26/2021  ? Procedure: LEFT HEART CATH AND CORS/GRAFTS ANGIOGRAPHY;  Surgeon: Wellington Hampshire, MD;  Location: [place] [facility] LAB;  Service: Cardiovascular;  Laterality: N/A;  ? LEFT HEART CATHETERIZATION WITH CORONARY ANGIOGRAM N/A 06/18/2014  ? Procedure: LEFT HEART CATHETERIZATION WITH CORONARY ANGIOGRAM;  Surgeon: Blane Ohara, MD;  Location: [facility] CATH LAB;  Service: Cardiovascular;  Laterality: N/A;  ? RADIAL ARTERY HARVEST Left 08/06/2017  ? Procedure: RADIAL ARTERY HARVEST;  Surgeon: Gaye Pollack, MD;  Location: [place];  Service: Open Heart Surgery;  Laterality: Left;  ? TEE WITHOUT CARDIOVERSION N/A 08/06/2017  ? Procedure: TRANSESOPHAGEAL ECHOCARDIOGRAM (TEE);  Surgeon: Gaye Pollack, MD;  Location: [place];  Service: Open Heart Surgery;  Laterality: N/A;  ?  TOTAL HIP ARTHROPLASTY Right 02/07/2016  ? Procedure: RIGHT TOTAL HIP ARTHROPLASTY ANTERIOR APPROACH;  Surgeon: Mcarthur Rossetti, MD;  Location: [place];  Service: Orthopedics;  Laterality: Right;  ? TRANSURETHRAL RESECTION OF PROSTATE    ? ? ?Current Outpatient Medications  ?Medication Sig Dispense Refill  ? amiodarone (PACERONE) 200 MG tablet TAKE 1/2 TABLET BY MOUTH DAILY (cut in half) 45 tablet 3  ? apixaban (ELIQUIS) 5 MG TABS tablet TAKE 1 TABLET BY MOUTH TWICE DAILY 180 tablet 3  ? atorvastatin (LIPITOR) 80 MG tablet Take 1 tablet (80 mg total) by mouth at bedtime. 30 tablet 6  ? carboxymethylcellulose (REFRESH PLUS) 0.5 % SOLN Place 1 drop into both eyes daily as needed (Dry eyes).    ? clopidogrel (PLAVIX) 75 MG tablet TAKE 1 TABLET BY MOUTH DAILY WITH BREAKFAST (Patient taking differently: Take 75 mg by mouth daily.) 30 tablet 5  ? dexamethasone (DECADRON) 4 MG tablet Take 8 mg by mouth as directed. Take the day before, the evening of treatment And day after docetaxel    ? diltiazem (CARDIZEM CD) 240 MG 24 hr capsule TAKE ONE CAPSULE  BY MOUTH DAILY (Patient taking differently: 240 mg daily.) 90 capsule 2  ? furosemide (LASIX) 20 MG tablet TAKE TWO TABLETS BY MOUTH DAILY 180 tablet 3  ? isosorbide mononitrate (IMDUR) 60 MG 24 hr tablet TAKE 1 TABLET BY MOUTH TWICE DAILY every morning AND in THE evening (Patient taking differently: 60 mg 2 (two) times daily.) 60 tablet 5  ? losartan (COZAAR) 50 MG tablet Take 1 tablet (50 mg total) by mouth daily. 90 tablet 1  ? metoprolol tartrate (LOPRESSOR) 25 MG tablet Take 25 mg by mouth 2 (two) times daily.    ? Multiple Vitamin (MULTIVITAMIN) tablet Take 1 tablet by mouth at bedtime.    ? nitroGLYCERIN (NITROSTAT) 0.4 MG SL tablet DISSOLVE 1 TABLET UNDER THE TONGUE EVERY 5 MINUTES AS NEEDED FOR CHEST PAIN. DO NOT EXCEED A TOTAL OF 3 DOSES IN 15 MINUTES. (Patient taking differently: Place 0.4 mg under the tongue every 5 (five) minutes as needed for chest pain.) 25  tablet 3  ? Omega-3 Fatty Acids (FISH OIL) 1000 MG CAPS Take 2,000 mg by mouth daily.    ? ondansetron (ZOFRAN) 8 MG tablet Take 8 mg by mouth every 8 (eight) hours as needed for nausea or vomiting.

## 2021-09-12 NOTE — ED Provider Notes (Signed)
?[facility] ?Provider Note ? ? ?CSN: [id number] ?Arrival date & time: 09/12/21  1722 ? ?  ? ?History ? ?Chief Complaint  ?Patient presents with  ? Palpitations  ? ? ?Eric Beltran. is a 81 y.o. male. ? ? ?Palpitations ?Patient presents from his cardiology office and eating.  He was found to have elevated heart rate in the range of 150.  There was concern of atrial flutter.  He reports that he has had this 1 time in the past which was resolved with rate control medication.  He underwent cardiac cath and PCI 2 months ago.  He has had intermittent angina and elevated heart rate.  Today, he noticed elevated heart rate at 2:30 PM.  He checked it at home and found to be 150.  He did not have chest pain at that time.  For this reason, he presented to his cardiology office.  EKG at cardiology office showed atrial flutter with heart rate of 152 and RBBB.  He is on amiodarone and Eliquis at baseline.  He has been adherent to his Eliquis since his cardiac cath in March.  Per cardiology note, they recommend trial of adenosine for assessment of flutter waves.  A flutter was present, cardioversion.  They also recommended consideration of increasing baseline amiodarone to 200 mg daily.  Currently, patient denies any sensation of palpitations.  He does continue to endorse some mild chest tightness.  He denies any other current symptoms. ?  ? ?Home Medications ?Prior to Admission medications   ?Medication Sig Start Date End Date Taking? Authorizing Provider  ?amiodarone (PACERONE) 200 MG tablet TAKE 1/2 TABLET BY MOUTH DAILY (cut in half) ?Patient taking differently: Take 100 mg by mouth See admin instructions. 09/01/21  Yes Satira Sark, MD  ?apixaban (ELIQUIS) 5 MG TABS tablet TAKE 1 TABLET BY MOUTH TWICE DAILY ?Patient taking differently: Take 2.5 mg by mouth 2 (two) times daily. 08/28/21  Yes Satira Sark, MD  ?atorvastatin (LIPITOR) 80 MG tablet Take 1 tablet (80 mg total) by mouth at bedtime.  08/28/21  Yes Satira Sark, MD  ?clopidogrel (PLAVIX) 75 MG tablet TAKE 1 TABLET BY MOUTH DAILY WITH BREAKFAST ?Patient taking differently: Take 75 mg by mouth daily. 06/28/21  Yes Satira Sark, MD  ?diltiazem (CARDIZEM CD) 240 MG 24 hr capsule TAKE ONE CAPSULE BY MOUTH DAILY ?Patient taking differently: 240 mg daily. 03/30/21  Yes Satira Sark, MD  ?furosemide (LASIX) 20 MG tablet TAKE TWO TABLETS BY MOUTH DAILY 07/28/21  Yes Satira Sark, MD  ?isosorbide mononitrate (IMDUR) 60 MG 24 hr tablet TAKE 1 TABLET BY MOUTH TWICE DAILY every morning AND in THE evening ?Patient taking differently: 60 mg 2 (two) times daily. 05/29/21  Yes Satira Sark, MD  ?losartan (COZAAR) 50 MG tablet Take 1 tablet (50 mg total) by mouth daily. 06/13/21  Yes Satira Sark, MD  ?Multiple Vitamin (MULTIVITAMIN) tablet Take 1 tablet by mouth at bedtime.   Yes [provider]  ?nitroGLYCERIN (NITROSTAT) 0.4 MG SL tablet DISSOLVE 1 TABLET UNDER THE TONGUE EVERY 5 MINUTES AS NEEDED FOR CHEST PAIN. DO NOT EXCEED A TOTAL OF 3 DOSES IN 15 MINUTES. ?Patient taking differently: Place 0.4 mg under the tongue every 5 (five) minutes as needed for chest pain. 06/13/20  Yes Satira Sark, MD  ?Omega-3 Fatty Acids (FISH OIL) 1000 MG CAPS Take 2,000 mg by mouth daily.   Yes [provider]  ?ondansetron (ZOFRAN) 8  MG tablet Take 8 mg by mouth every 8 (eight) hours as needed for nausea or vomiting.   Yes [provider]  ?oxyCODONE (OXY IR/ROXICODONE) 5 MG immediate release tablet Take 5 mg by mouth every 4 (four) hours as needed for pain. 06/27/21  Yes [provider]  ?pantoprazole (PROTONIX) 40 MG tablet Take 1 tablet (40 mg total) by mouth daily. 07/27/21 10/25/21 Yes Kayleen Memos, DO  ?prochlorperazine (COMPAZINE) 10 MG tablet Take 10 mg by mouth every 6 (six) hours as needed for nausea or vomiting. 03/27/21  Yes [provider]  ?tamsulosin (FLOMAX) 0.4 MG CAPS capsule Take  0.4 mg by mouth at bedtime.   Yes [provider]  ?valACYclovir (VALTREX) 500 MG tablet Take 500 mg by mouth daily as needed (fever blisters).  01/21/20  Yes [provider]  ?zinc gluconate 50 MG tablet Take 50 mg by mouth at bedtime.   Yes [provider]  ?Ascorbic Acid (VITAMIN C) 500 MG CAPS Take 1 tablet by mouth daily.    [provider]  ?metoprolol tartrate (LOPRESSOR) 25 MG tablet Take 25 mg by mouth 2 (two) times daily.    [provider]  ?   ? ?Allergies    ?Chocolate flavor   ? ?Review of Systems   ?Review of Systems  ?Respiratory:  Positive for chest tightness.   ?Cardiovascular:  Positive for palpitations.  ?All other systems reviewed and are negative. ? ?Physical Exam ?Updated Vital Signs ?BP 126/78   Pulse 78   Temp 97.8 ?F (36.6 ?C) (Oral)   Resp 18   Ht '5\' 7"'$  (1.702 m)   Wt 82.6 kg   SpO2 100%   BMI 28.51 kg/m?  ?Physical Exam ?Vitals and nursing note reviewed.  ?Constitutional:   ?   General: He is not in acute distress. ?   Appearance: Normal appearance. He is well-developed and normal weight. He is not ill-appearing, toxic-appearing or diaphoretic.  ?HENT:  ?   Head: Normocephalic and atraumatic.  ?   Right Ear: External ear normal.  ?   Left Ear: External ear normal.  ?   Nose: Nose normal.  ?Eyes:  ?   Extraocular Movements: Extraocular movements intact.  ?   Conjunctiva/sclera: Conjunctivae normal.  ?Cardiovascular:  ?   Rate and Rhythm: Regular rhythm. Tachycardia present.  ?   Heart sounds: No murmur heard. ?Pulmonary:  ?   Effort: Pulmonary effort is normal. No respiratory distress.  ?   Breath sounds: Normal breath sounds.  ?Abdominal:  ?   Palpations: Abdomen is soft.  ?   Tenderness: There is no abdominal tenderness.  ?Musculoskeletal:     ?   General: No swelling.  ?   Cervical back: Normal range of motion and neck supple.  ?   Right lower leg: No edema.  ?   Left lower leg: No edema.  ?   Comments: Right leg prosthesis  ?Skin: ?    General: Skin is warm and dry.  ?   Coloration: Skin is not jaundiced or pale.  ?Neurological:  ?   General: No focal deficit present.  ?   Mental Status: He is alert and oriented to person, place, and time.  ?   Cranial Nerves: No cranial nerve deficit.  ?   Sensory: No sensory deficit.  ?   Motor: No weakness.  ?   Coordination: Coordination normal.  ?Psychiatric:     ?   Mood and Affect: Mood normal.     ?  Behavior: Behavior normal.     ?   Thought Content: Thought content normal.     ?   Judgment: Judgment normal.  ? ? ?ED Results / Procedures / Treatments   ?Labs ?(all labs ordered are listed, but only abnormal results are displayed) ?Labs Reviewed  ?BASIC METABOLIC PANEL - Abnormal; Notable for the following components:  ?    Result Value  ? CO2 20 (*)   ? Glucose, Bld 302 (*)   ? All other components within normal limits  ?CBC WITH DIFFERENTIAL/PLATELET - Abnormal; Notable for the following components:  ? WBC 57.0 (*)   ? RBC 3.28 (*)   ? Hemoglobin 10.0 (*)   ? HCT 30.5 (*)   ? RDW 19.1 (*)   ? Platelets 70 (*)   ? nRBC 0.4 (*)   ? Neutro Abs 47.3 (*)   ? Monocytes Absolute 3.4 (*)   ? Basophils Absolute 0.6 (*)   ? All other components within normal limits  ?CBG MONITORING, ED - Abnormal; Notable for the following components:  ? Glucose-Capillary 295 (*)   ? All other components within normal limits  ?TROPONIN I (HIGH SENSITIVITY) - Abnormal; Notable for the following components:  ? Troponin I (High Sensitivity) 23 (*)   ? All other components within normal limits  ?TROPONIN I (HIGH SENSITIVITY) - Abnormal; Notable for the following components:  ? Troponin I (High Sensitivity) 41 (*)   ? All other components within normal limits  ?TROPONIN I (HIGH SENSITIVITY) - Abnormal; Notable for the following components:  ? Troponin I (High Sensitivity) 55 (*)   ? All other components within normal limits  ?MAGNESIUM  ?PROTIME-INR  ?PATHOLOGIST SMEAR REVIEW  ? ? ?EKG ?EKG Interpretation ? ?Date/Time:  Tuesday Sep 12 2021 18:51:39 EDT ?Ventricular Rate:  87 ?PR Interval:  207 ?QRS Duration: 124 ?QT Interval:  391 ?QTC Calculation: 471 ?R Axis:   39 ?Text Interpretation: Sinus rhythm Atrial premature complex Right bundle b

## 2021-09-12 NOTE — ED Notes (Signed)
Patient alert and oriented x 3, Patient's heart rate 140's valsalva maneuver attempted  x HR dropped to 130's. Will continue to monitor EDP notified. ?

## 2021-09-12 NOTE — ED Triage Notes (Signed)
Pt seen in cardiac office in [place], having elevated hr 150s, per nurse, ekg showed flutter. ?

## 2021-09-13 DIAGNOSIS — S27309A Unspecified injury of lung, unspecified, initial encounter: Secondary | ICD-10-CM | POA: Diagnosis not present

## 2021-09-13 DIAGNOSIS — Z20822 Contact with and (suspected) exposure to covid-19: Secondary | ICD-10-CM | POA: Diagnosis not present

## 2021-09-13 DIAGNOSIS — Z79899 Other long term (current) drug therapy: Secondary | ICD-10-CM | POA: Diagnosis not present

## 2021-09-13 DIAGNOSIS — I4892 Unspecified atrial flutter: Secondary | ICD-10-CM | POA: Diagnosis not present

## 2021-09-13 DIAGNOSIS — Z955 Presence of coronary angioplasty implant and graft: Secondary | ICD-10-CM | POA: Diagnosis not present

## 2021-09-13 DIAGNOSIS — Z8679 Personal history of other diseases of the circulatory system: Secondary | ICD-10-CM | POA: Diagnosis not present

## 2021-09-13 DIAGNOSIS — I110 Hypertensive heart disease with heart failure: Secondary | ICD-10-CM | POA: Diagnosis not present

## 2021-09-13 DIAGNOSIS — R911 Solitary pulmonary nodule: Secondary | ICD-10-CM | POA: Diagnosis not present

## 2021-09-13 DIAGNOSIS — R0603 Acute respiratory distress: Secondary | ICD-10-CM | POA: Diagnosis not present

## 2021-09-13 DIAGNOSIS — K449 Diaphragmatic hernia without obstruction or gangrene: Secondary | ICD-10-CM | POA: Diagnosis not present

## 2021-09-13 DIAGNOSIS — E871 Hypo-osmolality and hyponatremia: Secondary | ICD-10-CM | POA: Diagnosis not present

## 2021-09-13 DIAGNOSIS — A411 Sepsis due to other specified staphylococcus: Secondary | ICD-10-CM | POA: Diagnosis not present

## 2021-09-13 DIAGNOSIS — D481 Neoplasm of uncertain behavior of connective and other soft tissue: Secondary | ICD-10-CM | POA: Diagnosis not present

## 2021-09-13 DIAGNOSIS — I2510 Atherosclerotic heart disease of native coronary artery without angina pectoris: Secondary | ICD-10-CM | POA: Diagnosis not present

## 2021-09-13 DIAGNOSIS — I4510 Unspecified right bundle-branch block: Secondary | ICD-10-CM | POA: Diagnosis present

## 2021-09-13 DIAGNOSIS — J479 Bronchiectasis, uncomplicated: Secondary | ICD-10-CM | POA: Diagnosis not present

## 2021-09-13 DIAGNOSIS — I10 Essential (primary) hypertension: Secondary | ICD-10-CM | POA: Diagnosis not present

## 2021-09-13 DIAGNOSIS — I25709 Atherosclerosis of coronary artery bypass graft(s), unspecified, with unspecified angina pectoris: Secondary | ICD-10-CM | POA: Diagnosis not present

## 2021-09-13 DIAGNOSIS — N400 Enlarged prostate without lower urinary tract symptoms: Secondary | ICD-10-CM | POA: Diagnosis present

## 2021-09-13 DIAGNOSIS — R918 Other nonspecific abnormal finding of lung field: Secondary | ICD-10-CM | POA: Diagnosis not present

## 2021-09-13 DIAGNOSIS — J90 Pleural effusion, not elsewhere classified: Secondary | ICD-10-CM | POA: Diagnosis not present

## 2021-09-13 DIAGNOSIS — I5033 Acute on chronic diastolic (congestive) heart failure: Secondary | ICD-10-CM | POA: Diagnosis not present

## 2021-09-13 DIAGNOSIS — B957 Other staphylococcus as the cause of diseases classified elsewhere: Secondary | ICD-10-CM | POA: Diagnosis not present

## 2021-09-13 DIAGNOSIS — Z89611 Acquired absence of right leg above knee: Secondary | ICD-10-CM | POA: Diagnosis not present

## 2021-09-13 DIAGNOSIS — R6520 Severe sepsis without septic shock: Secondary | ICD-10-CM | POA: Diagnosis not present

## 2021-09-13 DIAGNOSIS — R002 Palpitations: Secondary | ICD-10-CM | POA: Diagnosis not present

## 2021-09-13 DIAGNOSIS — R0600 Dyspnea, unspecified: Secondary | ICD-10-CM | POA: Diagnosis not present

## 2021-09-13 DIAGNOSIS — I480 Paroxysmal atrial fibrillation: Secondary | ICD-10-CM | POA: Diagnosis not present

## 2021-09-13 DIAGNOSIS — C499 Malignant neoplasm of connective and soft tissue, unspecified: Secondary | ICD-10-CM | POA: Diagnosis not present

## 2021-09-13 DIAGNOSIS — T80211D Bloodstream infection due to central venous catheter, subsequent encounter: Secondary | ICD-10-CM | POA: Diagnosis not present

## 2021-09-13 DIAGNOSIS — R7881 Bacteremia: Secondary | ICD-10-CM | POA: Diagnosis not present

## 2021-09-13 DIAGNOSIS — I213 ST elevation (STEMI) myocardial infarction of unspecified site: Secondary | ICD-10-CM | POA: Diagnosis not present

## 2021-09-13 DIAGNOSIS — J704 Drug-induced interstitial lung disorders, unspecified: Secondary | ICD-10-CM | POA: Diagnosis not present

## 2021-09-13 DIAGNOSIS — C7802 Secondary malignant neoplasm of left lung: Secondary | ICD-10-CM | POA: Diagnosis not present

## 2021-09-13 DIAGNOSIS — R079 Chest pain, unspecified: Secondary | ICD-10-CM | POA: Diagnosis not present

## 2021-09-13 DIAGNOSIS — J189 Pneumonia, unspecified organism: Secondary | ICD-10-CM | POA: Diagnosis not present

## 2021-09-13 DIAGNOSIS — J989 Respiratory disorder, unspecified: Secondary | ICD-10-CM | POA: Diagnosis not present

## 2021-09-13 DIAGNOSIS — R509 Fever, unspecified: Secondary | ICD-10-CM | POA: Diagnosis not present

## 2021-09-13 DIAGNOSIS — Z452 Encounter for adjustment and management of vascular access device: Secondary | ICD-10-CM | POA: Diagnosis not present

## 2021-09-13 DIAGNOSIS — I712 Thoracic aortic aneurysm, without rupture, unspecified: Secondary | ICD-10-CM | POA: Diagnosis not present

## 2021-09-13 DIAGNOSIS — T80211A Bloodstream infection due to central venous catheter, initial encounter: Secondary | ICD-10-CM | POA: Diagnosis not present

## 2021-09-13 DIAGNOSIS — I252 Old myocardial infarction: Secondary | ICD-10-CM | POA: Diagnosis not present

## 2021-09-13 DIAGNOSIS — E782 Mixed hyperlipidemia: Secondary | ICD-10-CM | POA: Diagnosis present

## 2021-09-13 DIAGNOSIS — E876 Hypokalemia: Secondary | ICD-10-CM | POA: Diagnosis not present

## 2021-09-13 DIAGNOSIS — Y848 Other medical procedures as the cause of abnormal reaction of the patient, or of later complication, without mention of misadventure at the time of the procedure: Secondary | ICD-10-CM | POA: Diagnosis present

## 2021-09-13 DIAGNOSIS — J9621 Acute and chronic respiratory failure with hypoxia: Secondary | ICD-10-CM | POA: Diagnosis not present

## 2021-09-13 DIAGNOSIS — M1990 Unspecified osteoarthritis, unspecified site: Secondary | ICD-10-CM | POA: Diagnosis not present

## 2021-09-13 DIAGNOSIS — J9601 Acute respiratory failure with hypoxia: Secondary | ICD-10-CM | POA: Diagnosis not present

## 2021-09-13 DIAGNOSIS — R0902 Hypoxemia: Secondary | ICD-10-CM | POA: Diagnosis not present

## 2021-09-13 DIAGNOSIS — R0602 Shortness of breath: Secondary | ICD-10-CM | POA: Diagnosis not present

## 2021-09-13 LAB — PATHOLOGIST SMEAR REVIEW

## 2021-09-13 MED ORDER — ATORVASTATIN CALCIUM 40 MG PO TABS
80.0000 mg | ORAL_TABLET | Freq: Every day | ORAL | Status: DC
Start: 2021-09-13 — End: 2021-09-13
  Administered 2021-09-13: 80 mg via ORAL
  Filled 2021-09-13: qty 2

## 2021-09-13 MED ORDER — AMIODARONE HCL 200 MG PO TABS: 100.0000 mg | ORAL_TABLET | Freq: Every day | ORAL | Status: DC

## 2021-09-13 MED ORDER — DILTIAZEM HCL ER COATED BEADS 240 MG PO CP24
240.0000 mg | ORAL_CAPSULE | Freq: Once | ORAL | Status: AC
  Administered 2021-09-13: 240 mg via ORAL
  Filled 2021-09-13: qty 1

## 2021-09-13 MED ORDER — AMIODARONE HCL 200 MG PO TABS
100.0000 mg | ORAL_TABLET | ORAL | Status: DC
Start: 2021-09-13 — End: 2021-09-13

## 2021-09-13 MED ORDER — METOPROLOL TARTRATE 25 MG PO TABS
25.0000 mg | ORAL_TABLET | Freq: Two times a day (BID) | ORAL | Status: DC
  Administered 2021-09-13: 25 mg via ORAL
  Filled 2021-09-13: qty 1

## 2021-09-13 MED ORDER — APIXABAN 2.5 MG PO TABS
2.5000 mg | ORAL_TABLET | Freq: Two times a day (BID) | ORAL | Status: DC
  Administered 2021-09-13: 2.5 mg via ORAL
  Filled 2021-09-13: qty 1

## 2021-09-13 MED ORDER — CLOPIDOGREL BISULFATE 75 MG PO TABS
75.0000 mg | ORAL_TABLET | Freq: Every day | ORAL | Status: DC
  Administered 2021-09-13: 75 mg via ORAL
  Filled 2021-09-13: qty 1

## 2021-09-13 NOTE — Discharge Instructions (Signed)
Call your cardiologist tomorrow to discuss home medications.  Return to the emergency department if you have any return of symptoms. ?

## 2021-09-14 ENCOUNTER — Emergency Department (HOSPITAL_COMMUNITY): Payer: Medicare Other

## 2021-09-14 ENCOUNTER — Encounter (HOSPITAL_COMMUNITY): Payer: Self-pay

## 2021-09-14 ENCOUNTER — Emergency Department (HOSPITAL_COMMUNITY)
Admission: EM | Admit: 2021-09-14 | Discharge: 2021-09-14 | Disposition: A | Discharge status: Home / Self Care | Payer: Medicare Other | Source: Home / Self Care | Attending: Emergency Medicine | Admitting: Emergency Medicine

## 2021-09-14 DIAGNOSIS — R002 Palpitations: Secondary | ICD-10-CM | POA: Insufficient documentation

## 2021-09-14 DIAGNOSIS — D72829 Elevated white blood cell count, unspecified: Secondary | ICD-10-CM | POA: Insufficient documentation

## 2021-09-14 DIAGNOSIS — Z7902 Long term (current) use of antithrombotics/antiplatelets: Secondary | ICD-10-CM | POA: Insufficient documentation

## 2021-09-14 DIAGNOSIS — Z7901 Long term (current) use of anticoagulants: Secondary | ICD-10-CM | POA: Insufficient documentation

## 2021-09-14 DIAGNOSIS — I10 Essential (primary) hypertension: Secondary | ICD-10-CM | POA: Insufficient documentation

## 2021-09-14 DIAGNOSIS — R000 Tachycardia, unspecified: Secondary | ICD-10-CM | POA: Insufficient documentation

## 2021-09-14 DIAGNOSIS — Z79899 Other long term (current) drug therapy: Secondary | ICD-10-CM | POA: Insufficient documentation

## 2021-09-14 LAB — COMPREHENSIVE METABOLIC PANEL
ALT: 28 U/L (ref 0–44)
AST: 24 U/L (ref 15–41)
Albumin: 3.2 g/dL — ABNORMAL LOW (ref 3.5–5.0)
Alkaline Phosphatase: 144 U/L — ABNORMAL HIGH (ref 38–126)
Anion gap: 9 (ref 5–15)
BUN: 15 mg/dL (ref 8–23)
CO2: 20 mmol/L — ABNORMAL LOW (ref 22–32)
Calcium: 7.7 mg/dL — ABNORMAL LOW (ref 8.9–10.3)
Chloride: 101 mmol/L (ref 98–111)
Creatinine, Ser: 0.84 mg/dL (ref 0.61–1.24)
GFR, Estimated: >60 mL/min (ref >60)
Glucose, Bld: 136 mg/dL — ABNORMAL HIGH (ref 70–99)
Potassium: 3.6 mmol/L (ref 3.5–5.1)
Sodium: 130 mmol/L — ABNORMAL LOW (ref 135–145)
Total Bilirubin: 0.8 mg/dL (ref 0.3–1.2)
Total Protein: 5.8 g/dL — ABNORMAL LOW (ref 6.5–8.1)

## 2021-09-14 LAB — CBC WITH DIFFERENTIAL/PLATELET
Band Neutrophils: 6 %
Basophils Absolute: 0.6 10*3/uL — ABNORMAL HIGH (ref 0.0–0.1)
Basophils Relative: 2 %
Eosinophils Absolute: 0 10*3/uL (ref 0.0–0.5)
Eosinophils Relative: 0 %
HCT: 31 % — ABNORMAL LOW (ref 39.0–52.0)
Hemoglobin: 9.9 g/dL — ABNORMAL LOW (ref 13.0–17.0)
Lymphocytes Relative: 10 %
Lymphs Abs: 3.1 10*3/uL (ref 0.7–4.0)
MCH: 30.2 pg (ref 26.0–34.0)
MCHC: 31.9 g/dL (ref 30.0–36.0)
MCV: 94.5 fL (ref 80.0–100.0)
Metamyelocytes Relative: 1 %
Monocytes Absolute: 4.6 10*3/uL — ABNORMAL HIGH (ref 0.1–1.0)
Monocytes Relative: 15 %
Myelocytes: 4 %
Neutro Abs: 20.3 10*3/uL — ABNORMAL HIGH (ref 1.7–7.7)
Neutrophils Relative %: 60 %
Platelets: 93 10*3/uL — ABNORMAL LOW (ref 150–400)
Promyelocytes Relative: 2 %
RBC: 3.28 MIL/uL — ABNORMAL LOW (ref 4.22–5.81)
RDW: 20 % — ABNORMAL HIGH (ref 11.5–15.5)
WBC: 30.7 10*3/uL — ABNORMAL HIGH (ref 4.0–10.5)
nRBC: 0.8 % — ABNORMAL HIGH (ref 0.0–0.2)

## 2021-09-14 LAB — D-DIMER, QUANTITATIVE: D-Dimer, Quant: 0.86 ug/mL-FEU — ABNORMAL HIGH (ref 0.00–0.50)

## 2021-09-14 NOTE — ED Provider Notes (Signed)
?[facility] ?Provider Note ? ? ?CSN: [id number] ?Arrival date & time: 09/14/21  1734 ? ?  ? ?History ? ?Chief Complaint  ?Patient presents with  ? Palpitations  ? ? ?Eric Beltran. is a 81 y.o. male. ? ? ?Palpitations ? ?This patient is a 81 year old male, he is currently taking amiodarone, Eliquis, Plavix, diltiazem, losartan for a history of having atrial flutter.  He was last seen by his cardiologist a couple of days ago, he had a successful PCI to his distal RCA in March, he has a history of coronary bypass grafting many years ago. ? ?Appears that the patient was transferred to the hospital where he was treated in the emergency department for his tachycardia. ? ?He states that over the last couple of days he has had a intermittent tachycardia, feels like it gets up into the 90s or close to 100, he has not had any other symptoms, he has not had any nausea vomiting or diarrhea, he has not had any fevers chills coughing or shortness of breath and has no chest pain.  He was purely concerned about his heart rate being around 100 ? ?Home Medications ?Prior to Admission medications   ?Medication Sig Start Date End Date Taking? Authorizing Provider  ?amiodarone (PACERONE) 200 MG tablet TAKE 1/2 TABLET BY MOUTH DAILY (cut in half) ?Patient taking differently: Take 100 mg by mouth See admin instructions. 09/01/21  Yes Satira Sark, MD  ?apixaban (ELIQUIS) 5 MG TABS tablet TAKE 1 TABLET BY MOUTH TWICE DAILY ?Patient taking differently: Take 2.5 mg by mouth 2 (two) times daily. 08/28/21  Yes Satira Sark, MD  ?Ascorbic Acid (VITAMIN C) 500 MG CAPS Take 1 tablet by mouth daily.   Yes [provider]  ?atorvastatin (LIPITOR) 80 MG tablet Take 1 tablet (80 mg total) by mouth at bedtime. 08/28/21  Yes Satira Sark, MD  ?clopidogrel (PLAVIX) 75 MG tablet TAKE 1 TABLET BY MOUTH DAILY WITH BREAKFAST ?Patient taking differently: Take 75 mg by mouth daily. 06/28/21  Yes Satira Sark, MD  ?diltiazem (CARDIZEM CD) 240 MG 24 hr capsule TAKE ONE CAPSULE BY MOUTH DAILY ?Patient taking differently: 240 mg daily. 03/30/21  Yes Satira Sark, MD  ?furosemide (LASIX) 20 MG tablet TAKE TWO TABLETS BY MOUTH DAILY 07/28/21  Yes Satira Sark, MD  ?isosorbide mononitrate (IMDUR) 60 MG 24 hr tablet TAKE 1 TABLET BY MOUTH TWICE DAILY every morning AND in THE evening ?Patient taking differently: 60 mg 2 (two) times daily. 05/29/21  Yes Satira Sark, MD  ?losartan (COZAAR) 50 MG tablet Take 1 tablet (50 mg total) by mouth daily. 06/13/21  Yes Satira Sark, MD  ?metoprolol tartrate (LOPRESSOR) 25 MG tablet Take 25 mg by mouth 2 (two) times daily.   Yes [provider]  ?Multiple Vitamin (MULTIVITAMIN) tablet Take 1 tablet by mouth at bedtime.   Yes [provider]  ?nitroGLYCERIN (NITROSTAT) 0.4 MG SL tablet DISSOLVE 1 TABLET UNDER THE TONGUE EVERY 5 MINUTES AS NEEDED FOR CHEST PAIN. DO NOT EXCEED A TOTAL OF 3 DOSES IN 15 MINUTES. ?Patient taking differently: Place 0.4 mg under the tongue every 5 (five) minutes as needed for chest pain. 06/13/20  Yes Satira Sark, MD  ?Omega-3 Fatty Acids (FISH OIL) 1000 MG CAPS Take 2,000 mg by mouth daily.   Yes [provider]  ?ondansetron (ZOFRAN) 8 MG tablet Take 8 mg by mouth every 8 (eight) hours as needed for  nausea or vomiting.   Yes [provider]  ?oxyCODONE (OXY IR/ROXICODONE) 5 MG immediate release tablet Take 5 mg by mouth every 4 (four) hours as needed for pain. 06/27/21  Yes [provider]  ?pantoprazole (PROTONIX) 40 MG tablet Take 1 tablet (40 mg total) by mouth daily. 07/27/21 10/25/21 Yes Kayleen Memos, DO  ?prochlorperazine (COMPAZINE) 10 MG tablet Take 10 mg by mouth every 6 (six) hours as needed for nausea or vomiting. 03/27/21  Yes [provider]  ?tamsulosin (FLOMAX) 0.4 MG CAPS capsule Take 0.4 mg by mouth at bedtime.   Yes [provider]  ?valACYclovir (VALTREX)  500 MG tablet Take 500 mg by mouth daily as needed (fever blisters).  01/21/20  Yes [provider]  ?zinc gluconate 50 MG tablet Take 50 mg by mouth at bedtime.   Yes [provider]  ?   ? ?Allergies    ?Chocolate flavor   ? ?Review of Systems   ?Review of Systems  ?Cardiovascular:  Positive for palpitations.  ?All other systems reviewed and are negative. ? ?Physical Exam ?Updated Vital Signs ?BP 124/62   Pulse 80   Temp 98.1 ?F (36.7 ?C) (Oral)   Resp (!) 25   Ht 1.702 m ('5\' 7"'$ )   Wt 82.6 kg   SpO2 94%   BMI 28.51 kg/m?  ?Physical Exam ?Vitals and nursing note reviewed.  ?Constitutional:   ?   General: He is not in acute distress. ?   Appearance: He is well-developed.  ?HENT:  ?   Head: Normocephalic and atraumatic.  ?   Mouth/Throat:  ?   Pharynx: No oropharyngeal exudate.  ?Eyes:  ?   General: No scleral icterus.    ?   Right eye: No discharge.     ?   Left eye: No discharge.  ?   Conjunctiva/sclera: Conjunctivae normal.  ?   Pupils: Pupils are equal, round, and reactive to light.  ?Neck:  ?   Thyroid: No thyromegaly.  ?   Vascular: No JVD.  ?Cardiovascular:  ?   Rate and Rhythm: Normal rate and regular rhythm.  ?   Heart sounds: Normal heart sounds. No murmur heard. ?  No friction rub. No gallop.  ?Pulmonary:  ?   Effort: Pulmonary effort is normal. No respiratory distress.  ?   Breath sounds: Normal breath sounds. No wheezing or rales.  ?Abdominal:  ?   General: Bowel sounds are normal. There is no distension.  ?   Palpations: Abdomen is soft. There is no mass.  ?   Tenderness: There is no abdominal tenderness.  ?Musculoskeletal:     ?   General: No tenderness. Normal range of motion.  ?   Cervical back: Normal range of motion and neck supple.  ?Lymphadenopathy:  ?   Cervical: No cervical adenopathy.  ?Skin: ?   General: Skin is warm and dry.  ?   Findings: No erythema or rash.  ?Neurological:  ?   Mental Status: He is alert.  ?   Coordination: Coordination normal.  ?Psychiatric:     ?    Behavior: Behavior normal.  ? ? ?ED Results / Procedures / Treatments   ?Labs ?(all labs ordered are listed, but only abnormal results are displayed) ?Labs Reviewed  ?COMPREHENSIVE METABOLIC PANEL - Abnormal; Notable for the following components:  ?    Result Value  ? Sodium 130 (*)   ? CO2 20 (*)   ? Glucose, Bld 136 (*)   ?  Calcium 7.7 (*)   ? Total Protein 5.8 (*)   ? Albumin 3.2 (*)   ? Alkaline Phosphatase 144 (*)   ? All other components within normal limits  ?CBC WITH DIFFERENTIAL/PLATELET - Abnormal; Notable for the following components:  ? WBC 30.7 (*)   ? RBC 3.28 (*)   ? Hemoglobin 9.9 (*)   ? HCT 31.0 (*)   ? RDW 20.0 (*)   ? Platelets 93 (*)   ? nRBC 0.8 (*)   ? Neutro Abs 20.3 (*)   ? Monocytes Absolute 4.6 (*)   ? Basophils Absolute 0.6 (*)   ? All other components within normal limits  ?D-DIMER, QUANTITATIVE - Abnormal; Notable for the following components:  ? D-Dimer, Quant 0.86 (*)   ? All other components within normal limits  ? ? ?EKG ?EKG Interpretation ? ?Date/Time:  Thursday Sep 14 2021 18:02:48 EDT ?Ventricular Rate:  91 ?PR Interval:  200 ?QRS Duration: 106 ?QT Interval:  386 ?QTC Calculation: 474 ?R Axis:   60 ?Text Interpretation: Normal sinus rhythm Incomplete right bundle branch block Nonspecific ST abnormality Abnormal ECG When compared with ECG of 12-Sep-2021 18:51, PREVIOUS ECG IS PRESENT Confirmed by Noemi Chapel [phone]) on 09/14/2021 6:07:54 PM ? ?Radiology ?DG Chest 1 View ? ?Result Date: 09/14/2021 ?CLINICAL DATA:  Shortness of breath, palpitations EXAM: CHEST  1 VIEW COMPARISON:  Chest radiograph dated 09/12/2021. CT chest dated 08/03/2021. FINDINGS: Multifocal patchy/nodular opacities in the left upper lobe/lingula and left lower lobe, corresponding to the patient's known metastatic sarcoma on prior CT. Right lung is clear.  No pleural effusion or pneumothorax. Heart is top-normal in size. Postsurgical changes related to prior CABG. Thoracic aortic atherosclerosis. Right chest  power port terminates at the cavoatrial junction. Median sternotomy. 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 No evidence of acute cardiopulmonary disease. Electronically Signed   By: Bertis Ruddy

## 2021-09-14 NOTE — Discharge Instructions (Signed)
The office of your cardiologist in the morning to talk to them and get further advice.  At this time I have done a full evaluation and have not found any specific abnormalities that would be causing palpitations or fast heart rate.  In fact her vital signs of been totally normal with regards to your heart rate and your blood pressure. ? ?Emergency department for severe or worsening symptoms ?

## 2021-09-14 NOTE — ED Triage Notes (Signed)
Pt states that his hr is around 100 today.  States normal heart rate is around 65.  Ekg shows NSR  @ 92.  Pt is alert and oriented.  Denies dizziness.  Hx of afib.  Resp even and unlabored.  Skin warm and dry.  Denies cp.   ?

## 2021-09-15 ENCOUNTER — Ambulatory Visit: Admit: 2021-09-15 | Discharge: 2021-09-16 | Payer: MEDICARE

## 2021-09-15 DIAGNOSIS — I712 Thoracic aortic aneurysm, without rupture, unspecified: Secondary | ICD-10-CM | POA: Diagnosis not present

## 2021-09-15 DIAGNOSIS — J90 Pleural effusion, not elsewhere classified: Secondary | ICD-10-CM | POA: Diagnosis not present

## 2021-09-15 DIAGNOSIS — I2510 Atherosclerotic heart disease of native coronary artery without angina pectoris: Secondary | ICD-10-CM | POA: Diagnosis not present

## 2021-09-15 DIAGNOSIS — K449 Diaphragmatic hernia without obstruction or gangrene: Secondary | ICD-10-CM | POA: Diagnosis not present

## 2021-09-16 ENCOUNTER — Other Ambulatory Visit: Payer: Self-pay

## 2021-09-16 ENCOUNTER — Inpatient Hospital Stay (HOSPITAL_COMMUNITY)
Admission: EM | Admit: 2021-09-16 | Discharge: 2021-09-25 | DRG: 314 | Disposition: A | Discharge status: Home Health | Payer: Medicare Other | Attending: Internal Medicine | Admitting: Internal Medicine

## 2021-09-16 ENCOUNTER — Emergency Department (HOSPITAL_COMMUNITY): Payer: Medicare Other

## 2021-09-16 ENCOUNTER — Encounter (HOSPITAL_COMMUNITY): Payer: Self-pay

## 2021-09-16 DIAGNOSIS — Z7901 Long term (current) use of anticoagulants: Secondary | ICD-10-CM

## 2021-09-16 DIAGNOSIS — E782 Mixed hyperlipidemia: Secondary | ICD-10-CM | POA: Diagnosis present

## 2021-09-16 DIAGNOSIS — N400 Enlarged prostate without lower urinary tract symptoms: Secondary | ICD-10-CM | POA: Diagnosis present

## 2021-09-16 DIAGNOSIS — Z96641 Presence of right artificial hip joint: Secondary | ICD-10-CM | POA: Diagnosis present

## 2021-09-16 DIAGNOSIS — I480 Paroxysmal atrial fibrillation: Secondary | ICD-10-CM | POA: Diagnosis present

## 2021-09-16 DIAGNOSIS — I10 Essential (primary) hypertension: Secondary | ICD-10-CM | POA: Diagnosis present

## 2021-09-16 DIAGNOSIS — R7881 Bacteremia: Secondary | ICD-10-CM | POA: Diagnosis not present

## 2021-09-16 DIAGNOSIS — Z79899 Other long term (current) drug therapy: Secondary | ICD-10-CM

## 2021-09-16 DIAGNOSIS — E876 Hypokalemia: Secondary | ICD-10-CM | POA: Diagnosis not present

## 2021-09-16 DIAGNOSIS — I4510 Unspecified right bundle-branch block: Secondary | ICD-10-CM | POA: Diagnosis present

## 2021-09-16 DIAGNOSIS — I4892 Unspecified atrial flutter: Secondary | ICD-10-CM | POA: Diagnosis present

## 2021-09-16 DIAGNOSIS — C7802 Secondary malignant neoplasm of left lung: Secondary | ICD-10-CM | POA: Diagnosis present

## 2021-09-16 DIAGNOSIS — A411 Sepsis due to other specified staphylococcus: Secondary | ICD-10-CM | POA: Diagnosis present

## 2021-09-16 DIAGNOSIS — Z801 Family history of malignant neoplasm of trachea, bronchus and lung: Secondary | ICD-10-CM

## 2021-09-16 DIAGNOSIS — J9621 Acute and chronic respiratory failure with hypoxia: Secondary | ICD-10-CM | POA: Diagnosis present

## 2021-09-16 DIAGNOSIS — T80211A Bloodstream infection due to central venous catheter, initial encounter: Secondary | ICD-10-CM | POA: Diagnosis present

## 2021-09-16 DIAGNOSIS — Z955 Presence of coronary angioplasty implant and graft: Secondary | ICD-10-CM

## 2021-09-16 DIAGNOSIS — J189 Pneumonia, unspecified organism: Secondary | ICD-10-CM

## 2021-09-16 DIAGNOSIS — Y848 Other medical procedures as the cause of abnormal reaction of the patient, or of later complication, without mention of misadventure at the time of the procedure: Secondary | ICD-10-CM | POA: Diagnosis present

## 2021-09-16 DIAGNOSIS — I5033 Acute on chronic diastolic (congestive) heart failure: Secondary | ICD-10-CM | POA: Diagnosis not present

## 2021-09-16 DIAGNOSIS — I110 Hypertensive heart disease with heart failure: Secondary | ICD-10-CM | POA: Diagnosis present

## 2021-09-16 DIAGNOSIS — D481 Neoplasm of uncertain behavior of connective and other soft tissue: Secondary | ICD-10-CM | POA: Diagnosis present

## 2021-09-16 DIAGNOSIS — C499 Malignant neoplasm of connective and soft tissue, unspecified: Secondary | ICD-10-CM | POA: Diagnosis present

## 2021-09-16 DIAGNOSIS — I2510 Atherosclerotic heart disease of native coronary artery without angina pectoris: Secondary | ICD-10-CM | POA: Diagnosis present

## 2021-09-16 DIAGNOSIS — E871 Hypo-osmolality and hyponatremia: Secondary | ICD-10-CM | POA: Diagnosis present

## 2021-09-16 DIAGNOSIS — B957 Other staphylococcus as the cause of diseases classified elsewhere: Secondary | ICD-10-CM

## 2021-09-16 DIAGNOSIS — S27309A Unspecified injury of lung, unspecified, initial encounter: Secondary | ICD-10-CM | POA: Diagnosis not present

## 2021-09-16 DIAGNOSIS — J704 Drug-induced interstitial lung disorders, unspecified: Secondary | ICD-10-CM | POA: Diagnosis present

## 2021-09-16 DIAGNOSIS — Z8249 Family history of ischemic heart disease and other diseases of the circulatory system: Secondary | ICD-10-CM

## 2021-09-16 DIAGNOSIS — Z7722 Contact with and (suspected) exposure to environmental tobacco smoke (acute) (chronic): Secondary | ICD-10-CM | POA: Diagnosis present

## 2021-09-16 DIAGNOSIS — T462X5A Adverse effect of other antidysrhythmic drugs, initial encounter: Secondary | ICD-10-CM | POA: Diagnosis present

## 2021-09-16 DIAGNOSIS — Z89611 Acquired absence of right leg above knee: Secondary | ICD-10-CM

## 2021-09-16 DIAGNOSIS — J479 Bronchiectasis, uncomplicated: Secondary | ICD-10-CM | POA: Diagnosis present

## 2021-09-16 DIAGNOSIS — Z20822 Contact with and (suspected) exposure to covid-19: Secondary | ICD-10-CM | POA: Diagnosis present

## 2021-09-16 DIAGNOSIS — I25709 Atherosclerosis of coronary artery bypass graft(s), unspecified, with unspecified angina pectoris: Secondary | ICD-10-CM | POA: Diagnosis not present

## 2021-09-16 DIAGNOSIS — T80211D Bloodstream infection due to central venous catheter, subsequent encounter: Secondary | ICD-10-CM | POA: Diagnosis not present

## 2021-09-16 DIAGNOSIS — T451X5A Adverse effect of antineoplastic and immunosuppressive drugs, initial encounter: Secondary | ICD-10-CM | POA: Diagnosis present

## 2021-09-16 DIAGNOSIS — J984 Other disorders of lung: Secondary | ICD-10-CM

## 2021-09-16 DIAGNOSIS — R778 Other specified abnormalities of plasma proteins: Secondary | ICD-10-CM | POA: Diagnosis present

## 2021-09-16 DIAGNOSIS — J9601 Acute respiratory failure with hypoxia: Secondary | ICD-10-CM | POA: Diagnosis present

## 2021-09-16 DIAGNOSIS — Z8679 Personal history of other diseases of the circulatory system: Secondary | ICD-10-CM | POA: Diagnosis not present

## 2021-09-16 DIAGNOSIS — I252 Old myocardial infarction: Secondary | ICD-10-CM | POA: Diagnosis not present

## 2021-09-16 DIAGNOSIS — R6520 Severe sepsis without septic shock: Secondary | ICD-10-CM | POA: Diagnosis present

## 2021-09-16 DIAGNOSIS — Z85831 Personal history of malignant neoplasm of soft tissue: Secondary | ICD-10-CM

## 2021-09-16 DIAGNOSIS — Z7902 Long term (current) use of antithrombotics/antiplatelets: Secondary | ICD-10-CM

## 2021-09-16 DIAGNOSIS — T380X5A Adverse effect of glucocorticoids and synthetic analogues, initial encounter: Secondary | ICD-10-CM | POA: Diagnosis not present

## 2021-09-16 DIAGNOSIS — M1990 Unspecified osteoarthritis, unspecified site: Secondary | ICD-10-CM | POA: Diagnosis present

## 2021-09-16 DIAGNOSIS — Z820 Family history of epilepsy and other diseases of the nervous system: Secondary | ICD-10-CM

## 2021-09-16 LAB — CBC
HCT: 32 % — ABNORMAL LOW (ref 39.0–52.0)
Hemoglobin: 10.2 g/dL — ABNORMAL LOW (ref 13.0–17.0)
MCH: 30.4 pg (ref 26.0–34.0)
MCHC: 31.9 g/dL (ref 30.0–36.0)
MCV: 95.2 fL (ref 80.0–100.0)
Platelets: 217 10*3/uL (ref 150–400)
RBC: 3.36 MIL/uL — ABNORMAL LOW (ref 4.22–5.81)
RDW: 19.1 % — ABNORMAL HIGH (ref 11.5–15.5)
WBC: 25.7 10*3/uL — ABNORMAL HIGH (ref 4.0–10.5)
nRBC: 0.2 % (ref 0.0–0.2)

## 2021-09-16 LAB — RESP PANEL BY RT-PCR (FLU A&B, COVID) ARPGX2
Influenza A by PCR: NEGATIVE
Influenza B by PCR: NEGATIVE
SARS Coronavirus 2 by RT PCR: NEGATIVE

## 2021-09-16 LAB — BASIC METABOLIC PANEL
Anion gap: 12 (ref 5–15)
BUN: 12 mg/dL (ref 8–23)
CO2: 18 mmol/L — ABNORMAL LOW (ref 22–32)
Calcium: 8 mg/dL — ABNORMAL LOW (ref 8.9–10.3)
Chloride: 104 mmol/L (ref 98–111)
Creatinine, Ser: 1.01 mg/dL (ref 0.61–1.24)
GFR, Estimated: >60 mL/min (ref >60)
Glucose, Bld: 136 mg/dL — ABNORMAL HIGH (ref 70–99)
Potassium: 3.6 mmol/L (ref 3.5–5.1)
Sodium: 134 mmol/L — ABNORMAL LOW (ref 135–145)

## 2021-09-16 LAB — BRAIN NATRIURETIC PEPTIDE: B Natriuretic Peptide: 280.3 pg/mL — ABNORMAL HIGH (ref 0.0–100.0)

## 2021-09-16 LAB — I-STAT VENOUS BLOOD GAS, ED
Acid-base deficit: 2 mmol/L (ref 0.0–2.0)
Bicarbonate: 19.1 mmol/L — ABNORMAL LOW (ref 20.0–28.0)
Calcium, Ion: 1.03 mmol/L — ABNORMAL LOW (ref 1.15–1.40)
HCT: 31 % — ABNORMAL LOW (ref 39.0–52.0)
Hemoglobin: 10.5 g/dL — ABNORMAL LOW (ref 13.0–17.0)
O2 Saturation: 93 %
Potassium: 3.6 mmol/L (ref 3.5–5.1)
Sodium: 134 mmol/L — ABNORMAL LOW (ref 135–145)
TCO2: 20 mmol/L — ABNORMAL LOW (ref 22–32)
pCO2, Ven: 22.1 mmHg — ABNORMAL LOW (ref 44–60)
pH, Ven: 7.545 — ABNORMAL HIGH (ref 7.25–7.43)
pO2, Ven: 56 mmHg — ABNORMAL HIGH (ref 32–45)

## 2021-09-16 LAB — LACTIC ACID, PLASMA: Lactic Acid, Venous: 1.7 mmol/L (ref 0.5–1.9)

## 2021-09-16 LAB — TROPONIN I (HIGH SENSITIVITY)
Troponin I (High Sensitivity): 44 ng/L — ABNORMAL HIGH (ref <18)
Troponin I (High Sensitivity): 45 ng/L — ABNORMAL HIGH (ref <18)

## 2021-09-16 LAB — PROCALCITONIN: Procalcitonin: 0.21 ng/mL

## 2021-09-16 LAB — MAGNESIUM: Magnesium: 1.9 mg/dL (ref 1.7–2.4)

## 2021-09-16 MED ORDER — SODIUM CHLORIDE 0.9 % IV SOLN
2.0000 g | Freq: Once | INTRAVENOUS | Status: AC
  Administered 2021-09-16: 2 g via INTRAVENOUS
  Filled 2021-09-16: qty 12.5

## 2021-09-16 MED ORDER — VANCOMYCIN HCL 1750 MG/350ML IV SOLN
1750.0000 mg | Freq: Once | INTRAVENOUS | Status: AC
Start: 2021-09-16 — End: 2021-09-17
  Administered 2021-09-16: 1750 mg via INTRAVENOUS
  Filled 2021-09-16: qty 350

## 2021-09-16 MED ORDER — ACETAMINOPHEN 325 MG PO TABS
650.0000 mg | ORAL_TABLET | Freq: Once | ORAL | Status: AC
Start: 2021-09-16 — End: 2021-09-16
  Administered 2021-09-16: 650 mg via ORAL
  Filled 2021-09-16: qty 2

## 2021-09-16 NOTE — ED Provider Notes (Signed)
?[facility] ?Provider Note ? ? ?CSN: [id number] ?Arrival date & time: 09/16/21  1952 ? ?  ? ?History ? ?Chief Complaint  ?Patient presents with  ? Shortness of Breath  ? ? ?[name] is a 81 y.o. male. ? ?HPI ? ?Patient is 81 year old male with a history of sarcoma on chemotherapy, CKD, AKI, CABG x2 and a recent stent placement on Eliquis who presents to the emergency department by EMS due to concern for shortness of breath.  EMS was concerned about his EKG and he was activated as STEMI.  EMS has also given him 2 of nitro prior to arrival. He was also found to be hypoxic and was put on 3 L oxygen. After arrival, code STEMI was canceled after his EKG revealed chronic changes.   No new ischemic etiology was observed.  Patient primary concern is his shortness of breath.  He reports exertional dyspnea.  He has been having worsening cough and sputum production for the past few days.  He denies associated fever.  Denies emesis or diarrhea.  He does report being compliant with his medication.  Otherwise no other complaints. ? ? ?Home Medications ?Prior to Admission medications   ?Medication Sig Start Date End Date Taking? Authorizing Provider  ?amiodarone (PACERONE) 200 MG tablet TAKE 1/2 TABLET BY MOUTH DAILY (cut in half) ?Patient taking differently: Take 100 mg by mouth See admin instructions. 09/01/21   Satira Sark, MD  ?apixaban (ELIQUIS) 5 MG TABS tablet TAKE 1 TABLET BY MOUTH TWICE DAILY ?Patient taking differently: Take 2.5 mg by mouth 2 (two) times daily. 08/28/21   Satira Sark, MD  ?Ascorbic Acid (VITAMIN C) 500 MG CAPS Take 1 tablet by mouth daily.    [provider]  ?atorvastatin (LIPITOR) 80 MG tablet Take 1 tablet (80 mg total) by mouth at bedtime. 08/28/21   Satira Sark, MD  ?clopidogrel (PLAVIX) 75 MG tablet TAKE 1 TABLET BY MOUTH DAILY WITH BREAKFAST ?Patient taking differently: Take 75 mg by mouth daily. 06/28/21   Satira Sark, MD  ?diltiazem (CARDIZEM CD) 240 MG 24 hr capsule TAKE ONE CAPSULE BY MOUTH DAILY ?Patient taking differently: 240 mg daily. 03/30/21   Satira Sark, MD  ?furosemide (LASIX) 20 MG tablet TAKE TWO TABLETS BY MOUTH DAILY 07/28/21   Satira Sark, MD  ?isosorbide mononitrate (IMDUR) 60 MG 24 hr tablet TAKE 1 TABLET BY MOUTH TWICE DAILY every morning AND in THE evening ?Patient taking differently: 60 mg 2 (two) times daily. 05/29/21   Satira Sark, MD  ?losartan (COZAAR) 50 MG tablet Take 1 tablet (50 mg total) by mouth daily. 06/13/21   Satira Sark, MD  ?metoprolol tartrate (LOPRESSOR) 25 MG tablet Take 25 mg by mouth 2 (two) times daily.    [provider]  ?Multiple Vitamin (MULTIVITAMIN) tablet Take 1 tablet by mouth at bedtime.    [provider]  ?nitroGLYCERIN (NITROSTAT) 0.4 MG SL tablet DISSOLVE 1 TABLET UNDER THE TONGUE EVERY 5 MINUTES AS NEEDED FOR CHEST PAIN. DO NOT EXCEED A TOTAL OF 3 DOSES IN 15 MINUTES. ?Patient taking differently: Place 0.4 mg under the tongue every 5 (five) minutes as needed for chest pain. 06/13/20   Satira Sark, MD  ?Omega-3 Fatty Acids (FISH OIL) 1000 MG CAPS Take 2,000 mg by mouth daily.    [provider]  ?ondansetron (ZOFRAN) 8 MG tablet Take 8 mg by mouth every 8 (eight) hours as  needed for nausea or vomiting.    [provider]  ?oxyCODONE (OXY IR/ROXICODONE) 5 MG immediate release tablet Take 5 mg by mouth every 4 (four) hours as needed for pain. 06/27/21   [provider]  ?pantoprazole (PROTONIX) 40 MG tablet Take 1 tablet (40 mg total) by mouth daily. 07/27/21 10/25/21  Kayleen Memos, DO  ?prochlorperazine (COMPAZINE) 10 MG tablet Take 10 mg by mouth every 6 (six) hours as needed for nausea or vomiting. 03/27/21   [provider]  ?tamsulosin (FLOMAX) 0.4 MG CAPS capsule Take 0.4 mg by mouth at bedtime.    [provider]  ?valACYclovir (VALTREX) 500 MG tablet Take 500 mg by mouth  daily as needed (fever blisters).  01/21/20   [provider]  ?zinc gluconate 50 MG tablet Take 50 mg by mouth at bedtime.    [provider]  ?   ? ?Allergies    ?Chocolate flavor   ? ?Review of Systems   ?Review of Systems ? ?Physical Exam ?Updated Vital Signs ?BP 120/66   Pulse 85   Temp (!) 100.4 ?F (38 ?C) (Oral)   Resp (!) 21   SpO2 92%  ?Physical Exam ?Constitutional:   ?   Appearance: He is ill-appearing.  ?HENT:  ?   Head: Normocephalic.  ?Eyes:  ?   Extraocular Movements: Extraocular movements intact.  ?   Pupils: Pupils are equal, round, and reactive to light.  ?Cardiovascular:  ?   Rate and Rhythm: Normal rate.  ?Pulmonary:  ?   Effort: Tachypnea and respiratory distress present.  ?   Breath sounds: Examination of the right-lower field reveals rhonchi. Examination of the left-lower field reveals rhonchi. Rhonchi present. No wheezing.  ?Chest:  ?   Chest wall: No mass.  ?Musculoskeletal:  ?   Cervical back: Normal range of motion.  ?   Left lower leg: Edema present.  ?   Comments: Right above-the-knee amputation  ?Skin: ?   General: Skin is warm.  ?Neurological:  ?   General: No focal deficit present.  ?   Mental Status: He is oriented to person, place, and time.  ? ? ?ED Results / Procedures / Treatments   ?Labs ?(all labs ordered are listed, but only abnormal results are displayed) ?Labs Reviewed  ?BRAIN NATRIURETIC PEPTIDE - Abnormal; Notable for the following components:  ?    Result Value  ? B Natriuretic Peptide 280.3 (*)   ? All other components within normal limits  ?CBC - Abnormal; Notable for the following components:  ? WBC 25.7 (*)   ? RBC 3.36 (*)   ? Hemoglobin 10.2 (*)   ? HCT 32.0 (*)   ? RDW 19.1 (*)   ? All other components within normal limits  ?BASIC METABOLIC PANEL - Abnormal; Notable for the following components:  ? Sodium 134 (*)   ? CO2 18 (*)   ? Glucose, Bld 136 (*)   ? Calcium 8.0 (*)   ? All other components within normal limits  ?I-STAT VENOUS BLOOD GAS,  ED - Abnormal; Notable for the following components:  ? pH, Ven 7.545 (*)   ? pCO2, Ven 22.1 (*)   ? pO2, Ven 56 (*)   ? Bicarbonate 19.1 (*)   ? TCO2 20 (*)   ? Sodium 134 (*)   ? Calcium, Ion 1.03 (*)   ? HCT 31.0 (*)   ? Hemoglobin 10.5 (*)   ? All other components within normal limits  ?TROPONIN  I (HIGH SENSITIVITY) - Abnormal; Notable for the following components:  ? Troponin I (High Sensitivity) 44 (*)   ? All other components within normal limits  ?TROPONIN I (HIGH SENSITIVITY) - Abnormal; Notable for the following components:  ? Troponin I (High Sensitivity) 45 (*)   ? All other components within normal limits  ?RESP PANEL BY RT-PCR (FLU A&B, COVID) [id number]  ?CULTURE, BLOOD (ROUTINE X 2)  ?CULTURE, BLOOD (ROUTINE X 2)  ?MAGNESIUM  ?LACTIC ACID, PLASMA  ?PROCALCITONIN  ?PROCALCITONIN  ? ? ?EKG ?None ? ?Radiology ?DG Chest Portable 1 View ? ?Result Date: 09/16/2021 ?CLINICAL DATA:  Shortness of breath. EXAM: PORTABLE CHEST 1 VIEW COMPARISON:  Chest CT yesterday at [facility] 09/14/2021, 09/12/2021 FINDINGS: Right chest port with tip in the right atrium. Patient is post median sternotomy. Stable heart size and mediastinal contours. There is patchy bilateral suprahilar opacity that is superimposed on ground-glass opacities on yesterday's CT. Pulmonary nodules on areas of nodularity are better depicted on prior CT. No pneumothorax or large pleural effusion. IMPRESSION: Patchy bilateral suprahilar opacity superimposed on ground-glass opacities on yesterday's CT, may be edema or pneumonia. Definite progression over recent radiographs. Electronically Signed   By: Keith Rake M.D.   On: 09/16/2021 20:47   ? ?Procedures ?Procedures  ? ?Medications Ordered in ED ?Medications  ?vancomycin (VANCOREADY) IVPB 1750 mg/350 mL (1,750 mg Intravenous New Bag/Given 09/16/21 2226)  ?acetaminophen (TYLENOL) tablet 650 mg (650 mg Oral Given 09/16/21 2050)  ?ceFEPIme (MAXIPIME) 2 g in sodium chloride 0.9 % 100 mL IVPB  (0 g Intravenous Stopped 09/16/21 2159)  ? ? ?ED Course/ Medical Decision Making/ A&P ?Clinical Course as of 09/16/21 2350  ?Sat May 06, 770  ?647 81 year old male with significant cardiac disease on anticoag

## 2021-09-17 DIAGNOSIS — I10 Essential (primary) hypertension: Secondary | ICD-10-CM

## 2021-09-17 DIAGNOSIS — J189 Pneumonia, unspecified organism: Secondary | ICD-10-CM | POA: Diagnosis not present

## 2021-09-17 DIAGNOSIS — I480 Paroxysmal atrial fibrillation: Secondary | ICD-10-CM | POA: Diagnosis not present

## 2021-09-17 DIAGNOSIS — C499 Malignant neoplasm of connective and soft tissue, unspecified: Secondary | ICD-10-CM | POA: Diagnosis present

## 2021-09-17 DIAGNOSIS — I25709 Atherosclerosis of coronary artery bypass graft(s), unspecified, with unspecified angina pectoris: Secondary | ICD-10-CM | POA: Diagnosis not present

## 2021-09-17 LAB — BLOOD CULTURE ID PANEL (REFLEXED) - BCID2

## 2021-09-17 LAB — CBC
HCT: 27.6 % — ABNORMAL LOW (ref 39.0–52.0)
Hemoglobin: 9 g/dL — ABNORMAL LOW (ref 13.0–17.0)
MCH: 30.6 pg (ref 26.0–34.0)
MCHC: 32.6 g/dL (ref 30.0–36.0)
MCV: 93.9 fL (ref 80.0–100.0)
Platelets: 196 10*3/uL (ref 150–400)
RBC: 2.94 MIL/uL — ABNORMAL LOW (ref 4.22–5.81)
RDW: 19.3 % — ABNORMAL HIGH (ref 11.5–15.5)
WBC: 22.7 10*3/uL — ABNORMAL HIGH (ref 4.0–10.5)
nRBC: 0.1 % (ref 0.0–0.2)

## 2021-09-17 LAB — BASIC METABOLIC PANEL
Anion gap: 8 (ref 5–15)
BUN: 13 mg/dL (ref 8–23)
CO2: 20 mmol/L — ABNORMAL LOW (ref 22–32)
Calcium: 7.5 mg/dL — ABNORMAL LOW (ref 8.9–10.3)
Chloride: 108 mmol/L (ref 98–111)
Creatinine, Ser: 1.01 mg/dL (ref 0.61–1.24)
GFR, Estimated: >60 mL/min (ref >60)
Glucose, Bld: 151 mg/dL — ABNORMAL HIGH (ref 70–99)
Potassium: 3.1 mmol/L — ABNORMAL LOW (ref 3.5–5.1)
Sodium: 136 mmol/L (ref 135–145)

## 2021-09-17 LAB — PROCALCITONIN: Procalcitonin: 0.31 ng/mL

## 2021-09-17 LAB — STREP PNEUMONIAE URINARY ANTIGEN: Strep Pneumo Urinary Antigen: NEGATIVE

## 2021-09-17 MED ORDER — CLOPIDOGREL BISULFATE 75 MG PO TABS
75.0000 mg | ORAL_TABLET | Freq: Every day | ORAL | Status: DC
  Administered 2021-09-17 – 2021-09-25 (×9): 75 mg via ORAL
  Filled 2021-09-17 (×9): qty 1

## 2021-09-17 MED ORDER — OXYCODONE HCL 5 MG PO TABS
5.0000 mg | ORAL_TABLET | Freq: Four times a day (QID) | ORAL | Status: DC | PRN
  Administered 2021-09-21 – 2021-09-22 (×2): 5 mg via ORAL
  Filled 2021-09-17 (×2): qty 1

## 2021-09-17 MED ORDER — APIXABAN 2.5 MG PO TABS
2.5000 mg | ORAL_TABLET | Freq: Two times a day (BID) | ORAL | Status: DC
  Administered 2021-09-17 – 2021-09-25 (×18): 2.5 mg via ORAL
  Filled 2021-09-17 (×18): qty 1

## 2021-09-17 MED ORDER — DILTIAZEM HCL ER COATED BEADS 240 MG PO CP24
240.0000 mg | ORAL_CAPSULE | Freq: Every day | ORAL | Status: DC
  Administered 2021-09-17 – 2021-09-25 (×9): 240 mg via ORAL
  Filled 2021-09-17 (×9): qty 1

## 2021-09-17 MED ORDER — PANTOPRAZOLE SODIUM 40 MG PO TBEC
40.0000 mg | DELAYED_RELEASE_TABLET | Freq: Every day | ORAL | Status: DC
  Administered 2021-09-17 – 2021-09-25 (×9): 40 mg via ORAL
  Filled 2021-09-17 (×9): qty 1

## 2021-09-17 MED ORDER — TAMSULOSIN HCL 0.4 MG PO CAPS
0.4000 mg | ORAL_CAPSULE | Freq: Every day | ORAL | Status: DC
  Administered 2021-09-17 – 2021-09-24 (×9): 0.4 mg via ORAL
  Filled 2021-09-17 (×9): qty 1

## 2021-09-17 MED ORDER — ONDANSETRON HCL 4 MG PO TABS
4.0000 mg | ORAL_TABLET | Freq: Four times a day (QID) | ORAL | Status: DC | PRN
Start: 2021-09-17 — End: 2021-09-25

## 2021-09-17 MED ORDER — ISOSORBIDE MONONITRATE ER 60 MG PO TB24
60.0000 mg | ORAL_TABLET | Freq: Two times a day (BID) | ORAL | Status: DC
Start: 2021-09-17 — End: 2021-09-18
  Administered 2021-09-17 (×3): 60 mg via ORAL
  Filled 2021-09-17: qty 2
  Filled 2021-09-17 (×2): qty 1

## 2021-09-17 MED ORDER — ONDANSETRON HCL 4 MG/2ML IJ SOLN: 4.0000 mg | Freq: Four times a day (QID) | INTRAMUSCULAR | Status: DC | PRN

## 2021-09-17 MED ORDER — SODIUM CHLORIDE 0.9% FLUSH
3.0000 mL | Freq: Two times a day (BID) | INTRAVENOUS | Status: DC
  Administered 2021-09-17 – 2021-09-25 (×17): 3 mL via INTRAVENOUS

## 2021-09-17 MED ORDER — SODIUM CHLORIDE 0.9 % IV SOLN
2.0000 g | Freq: Every day | INTRAVENOUS | Status: DC
  Administered 2021-09-17 – 2021-09-18 (×2): 2 g via INTRAVENOUS
  Filled 2021-09-17 (×3): qty 20

## 2021-09-17 MED ORDER — ACETAMINOPHEN 325 MG PO TABS
650.0000 mg | ORAL_TABLET | Freq: Four times a day (QID) | ORAL | Status: DC | PRN
Start: 2021-09-17 — End: 2021-09-25
  Administered 2021-09-17 – 2021-09-19 (×4): 650 mg via ORAL
  Filled 2021-09-17 (×4): qty 2

## 2021-09-17 MED ORDER — ALBUTEROL SULFATE (2.5 MG/3ML) 0.083% IN NEBU: 2.5000 mg | INHALATION_SOLUTION | RESPIRATORY_TRACT | Status: DC | PRN

## 2021-09-17 MED ORDER — ATORVASTATIN CALCIUM 80 MG PO TABS
80.0000 mg | ORAL_TABLET | Freq: Every day | ORAL | Status: DC
  Administered 2021-09-17 – 2021-09-24 (×9): 80 mg via ORAL
  Filled 2021-09-17 (×8): qty 1
  Filled 2021-09-17: qty 2

## 2021-09-17 MED ORDER — AMIODARONE HCL 100 MG PO TABS
100.0000 mg | ORAL_TABLET | Freq: Every day | ORAL | Status: DC
Start: 2021-09-17 — End: 2021-09-21
  Administered 2021-09-17 – 2021-09-20 (×4): 100 mg via ORAL
  Filled 2021-09-17 (×4): qty 1

## 2021-09-17 MED ORDER — LOSARTAN POTASSIUM 50 MG PO TABS
50.0000 mg | ORAL_TABLET | Freq: Every day | ORAL | Status: DC
  Administered 2021-09-17: 50 mg via ORAL
  Filled 2021-09-17: qty 1

## 2021-09-17 MED ORDER — SUCRALFATE 1 GM/10ML PO SUSP
1.0000 g | Freq: Three times a day (TID) | ORAL | Status: DC
  Administered 2021-09-17 – 2021-09-18 (×5): 1 g via ORAL
  Filled 2021-09-17 (×6): qty 10

## 2021-09-17 MED ORDER — ACETAMINOPHEN 650 MG RE SUPP: 650.0000 mg | Freq: Four times a day (QID) | RECTAL | Status: DC | PRN

## 2021-09-17 MED ORDER — SENNOSIDES-DOCUSATE SODIUM 8.6-50 MG PO TABS
1.0000 | ORAL_TABLET | Freq: Every evening | ORAL | Status: DC | PRN
  Administered 2021-09-20: 1 via ORAL
  Filled 2021-09-17: qty 1

## 2021-09-17 MED ORDER — SODIUM CHLORIDE 0.9 % IV SOLN
500.0000 mg | Freq: Every day | INTRAVENOUS | Status: DC
  Administered 2021-09-17: 500 mg via INTRAVENOUS
  Filled 2021-09-17: qty 5

## 2021-09-17 MED ORDER — VANCOMYCIN HCL 1250 MG/250ML IV SOLN
1250.0000 mg | INTRAVENOUS | Status: DC
  Administered 2021-09-17 – 2021-09-19 (×3): 1250 mg via INTRAVENOUS
  Filled 2021-09-17 (×3): qty 250

## 2021-09-17 NOTE — Progress Notes (Addendum)
?Progress Note ? ? ?Patient: Eric Beltran. WFU:[id number] DOB: [birth year] DOA: 09/16/2021     1 ?DOS: the patient was seen and examined on 09/17/2021 ?  ?Brief hospital course: ?Eric Beltran was admitted to the hospital with the working diagnosis of pneumonia complicated with acute hypoxemic respiratory failure.  ? ?81 yo male with the past medical history of coronary artery disease, atrial fibrillation, and sarcoma presented with cough, fatigue and dyspnea. Reported 3 days of persistent symptoms, that prompted him to come to the hospital. On his initial physical examination his blood pressure was 132/69. HR 91. RR 34. 02 saturation 91% and temp 38 C. Lungs with bilateral rales, with no wheezing, heart with S1 and S2 present and rhythmic, abdomen not distended and pitting edema at the left lower extremity.   ? ?Na 134, K 3,6 cl 104, bicarbonate 18, glucose 136, bun 12 cr 1,0 ?Mg 1,9  ?Venous pH 7,54 with Pco2 22,1  ?High sensitive troponin 44 and 45  ?Wbc 25,7 hgb 10,2 plt 217  ?Sars covid 19 negative  ? ?Chest radiograph with right upper lobe and left lower lobe interstitial infiltrate. Port in place on right IJ vein.  ? ?EKG 101 bpm, with normal axis, right bundle branch block, sinus rhythm with q wave lead III and AvF, with no significant ST segment or T wave changes.  ? ?Assessment and Plan: ?Pneumonia ?Peripheral blood cultures positive for staph epidermidis 3/4 bottles, resistant to methicillin.  ? ?Possible port infection complicated with staph epidermidis bacteremia.  ? ?Plan to change antibiotic therapy to vancomycin and continue with ceftriaxone. ?Discontinue azithromycin. ? ?Will consult ID, port may need to be removed. ?Continue close follow up on cell, count and temperature curve.  ?Repeat blood cultures.  ? ?Acute  Hypoxemic respiratory failure.  ?Continue oxymetry monitoring and supplemental 02 per [place] ? ? ?Paroxysmal atrial fibrillation (HCC) ?Continue rate control with diltiazem and amiodarone.   ?Anticoagulation with apixaban.  ? ?Coronary artery disease involving coronary bypass graft of native heart with angina pectoris ([place]) ?No chest pain, continue blood pressure control and antiplatelet therapy with clopidogrel.  ? ?Essential hypertension ?Continue blood pressure control with isosorbide and losartan.  ? ?Sarcoma ([place]) ?Sp right AKA. Positive pulmonary metastasis.  ?Undergoing chemotherapy.  ? ? ? ? ?  ? ?Subjective: Patient with no chest pain, continue to have dyspnea with minimal efforts. Positive fever this am at 4;50 am.  ? ?Physical Exam: ?Vitals:  ? 09/17/21 0411 09/17/21 0450 09/17/21 0620 09/17/21 1117  ?BP: 131/79 120/64 116/67 136/64  ?Pulse: 92 88 91 85  ?Resp:  (!) 29 (!) 28 (!) 22  ?Temp: 99.9 ?F (37.7 ?C) (!) 100.4 ?F (38 ?C) 100 ?F (37.8 ?C)   ?TempSrc: Oral Oral Oral   ?SpO2: (!) 86% 94% 93% 95%  ?Weight:      ?Height:      ? ?Neurology awake and alert ?ENT with mild pallor ?Cardiovascular with S1 and S2 present and rhythmic with no gallops, rubs or murmurs ?Respiratory with scattered rales at bases but no wheezing or rhonchi ?Abdomen not distended ?Trace left lower extremity edema, right AKA  ?Data Reviewed: ? ? ? ?Family Communication: I spoke with patient's daughter at the bedside, we talked in detail about patient's condition, plan of care and prognosis and all questions were addressed. ? ? ?Disposition: ?Status is: Inpatient ?Remains inpatient appropriate because: bacteremia  ? Planned Discharge Destination: Home ? ?Author: ?Tawni Millers, MD ?09/17/2021 2:10 PM ? ?For on call  review [email or web address].  ?

## 2021-09-17 NOTE — Assessment & Plan Note (Addendum)
Continue rate control with diltiazem and amiodarone.  ?At this point doubt amiodarone induced acute lung injury but will have a close follow up if patient does not respond to diuresis. ? ?Anticoagulation with apixaban.  ?

## 2021-09-17 NOTE — Hospital Course (Addendum)
Eric Beltran was admitted to the hospital with the working diagnosis of staphylococcus bacteremia, cathter related blood stream infection, complicated with sepsis, pneumonia and acute hypoxemic respiratory failure.  ? ?81 yo male with the past medical history of coronary artery disease, atrial fibrillation, and sarcoma presented with cough, fatigue and dyspnea. Reported 3 days of persistent symptoms, that prompted him to come to the hospital. On his initial physical examination his blood pressure was 132/69. HR 91. RR 34. 02 saturation 91% and temp 38 C. Lungs with bilateral rales, with no wheezing, heart with S1 and S2 present and rhythmic, abdomen not distended and pitting edema at the left lower extremity.   ? ?Na 134, K 3,6 cl 104, bicarbonate 18, glucose 136, bun 12 cr 1,0 ?Mg 1,9  ?Venous pH 7,54 with Pco2 22,1  ?High sensitive troponin 44 and 45  ?Wbc 25,7 hgb 10,2 plt 217  ?Sars covid 19 negative  ? ?Chest radiograph with right upper lobe and left lower lobe interstitial infiltrate. Port in place on right IJ vein.  ? ?EKG 101 bpm, with normal axis, right bundle branch block, sinus rhythm with q wave lead III and AvF, with no significant ST segment or T wave changes.  ? ?Blood cultures positive for staphylococcus epidermidis 3/4 bottles resistant to methicillin.  ?Suspected Port a Cath infection, consulted ID with recommendations for port removal.  ?IR has been consulted. ? ?05/09 patient with worsening respiratory failure, chest film consistent with worsening infiltrates. He required Bipap for short period of time and was treated with furosemide.  ? ?Echocardiogram with preserved LV systolic function.  ? ?

## 2021-09-17 NOTE — Progress Notes (Signed)
PHARMACY - PHYSICIAN COMMUNICATION ?CRITICAL VALUE ALERT - BLOOD CULTURE IDENTIFICATION (BCID) ? ?Eric Beltran. is an 81 y.o. male who presented to [facility] on 09/16/2021  ? ?Assessment:  3/4 GPC in cluster, BCID staph epi with mec a detected  ? ?Name of physician (or Provider) Contacted: Dr. Cathlean Sauer ? ?Current antibiotics: Ceftriaxone and azithromycin ? ?Changes to prescribed antibiotics recommended:  ?Start vancomycin per pharmacy consult ?Continue Ceftriaxone  ?Stop azithromycin ?Recommendations accepted by provider ? ?Results for orders placed or performed during the hospital encounter of 09/16/21  ?Blood Culture ID Panel (Reflexed) (Collected: 09/16/2021  8:08 PM)  ?Result Value Ref Range  ? Enterococcus faecalis NOT DETECTED NOT DETECTED  ? Enterococcus Faecium NOT DETECTED NOT DETECTED  ? Listeria monocytogenes NOT DETECTED NOT DETECTED  ? Staphylococcus species DETECTED (A) NOT DETECTED  ? Staphylococcus aureus (BCID) NOT DETECTED NOT DETECTED  ? Staphylococcus epidermidis DETECTED (A) NOT DETECTED  ? Staphylococcus lugdunensis NOT DETECTED NOT DETECTED  ? Streptococcus species NOT DETECTED NOT DETECTED  ? Streptococcus agalactiae NOT DETECTED NOT DETECTED  ? Streptococcus pneumoniae NOT DETECTED NOT DETECTED  ? Streptococcus pyogenes NOT DETECTED NOT DETECTED  ? A.calcoaceticus-baumannii NOT DETECTED NOT DETECTED  ? Bacteroides fragilis NOT DETECTED NOT DETECTED  ? Enterobacterales NOT DETECTED NOT DETECTED  ? Enterobacter cloacae complex NOT DETECTED NOT DETECTED  ? Escherichia coli NOT DETECTED NOT DETECTED  ? Klebsiella aerogenes NOT DETECTED NOT DETECTED  ? Klebsiella oxytoca NOT DETECTED NOT DETECTED  ? Klebsiella pneumoniae NOT DETECTED NOT DETECTED  ? Proteus species NOT DETECTED NOT DETECTED  ? Salmonella species NOT DETECTED NOT DETECTED  ? Serratia marcescens NOT DETECTED NOT DETECTED  ? Haemophilus influenzae NOT DETECTED NOT DETECTED  ? Neisseria meningitidis NOT DETECTED NOT DETECTED  ?  Pseudomonas aeruginosa NOT DETECTED NOT DETECTED  ? Stenotrophomonas maltophilia NOT DETECTED NOT DETECTED  ? Candida albicans NOT DETECTED NOT DETECTED  ? Candida auris NOT DETECTED NOT DETECTED  ? Candida glabrata NOT DETECTED NOT DETECTED  ? Candida krusei NOT DETECTED NOT DETECTED  ? Candida parapsilosis NOT DETECTED NOT DETECTED  ? Candida tropicalis NOT DETECTED NOT DETECTED  ? Cryptococcus neoformans/gattii NOT DETECTED NOT DETECTED  ? Methicillin resistance mecA/C DETECTED (A) NOT DETECTED  ? ? ?[name] ?09/17/2021  1:12 PM ? ?

## 2021-09-17 NOTE — Progress Notes (Signed)
Pharmacy Antibiotic Note ? ?Eric Beltran. is a 81 y.o. male admitted on 09/16/2021 with pneumonia and bacteremia.  Pharmacy has been consulted for vancomycin dosing. ? ?Patient presented with shortness of breath and fatigue. Imaging showed lung opacities. Vitals stable. Requiring 10L O2. MRSE growing in 3/4 bottles from two different sites. Afebrile on acetaminophen. WBC 22.7. Patient given 1750 mg x1 dose of vanc in ED yesterday evening.  ? ?Plan: ?Give vancomycin 1250 every 24 hours for [facility] using Cr 1.01 ?Ceftriaxone per MD ?Continue to monitor s/s of infection ?Levels as appropriate ? ?Height: '5\' 7"'$  (170.2 cm) ?Weight: 78.4 kg (172 lb 13.5 oz) ?IBW/kg (Calculated) : 66.1 ? ?Temp (24hrs), Avg:99.6 ?F (37.6 ?C), Min:97.7 ?F (36.5 ?C), Max:100.4 ?F (38 ?C) ? ?Recent Labs  ?Lab 09/12/21 ?1814 09/14/21 ?2000 09/16/21 ?2013 09/16/21 ?2014 09/17/21 ?0234  ?WBC 57.0* 30.7* 25.7*  --  22.7*  ?CREATININE 0.84 0.84 1.01  --  1.01  ?LATICACIDVEN  --   --   --  1.7  --   ?  ?Estimated Creatinine Clearance: 55.4 mL/min (by C-G formula based on SCr of 1.01 mg/dL).   ? ?Allergies  ?Allergen Reactions  ? Chocolate Flavor Other (See Comments)  ?  Stuffy nose   ? ? ?Antimicrobials this admission: ?Vanc 5/6 >>  ?Ceftriaxone 5/7 >>  ?Azithromycin 5/7 x 1 ?Cefepime 5/6 x 1 ? ?Dose adjustments this admission: ?none ? ?Microbiology results: ?5/6 BCx: MRSE ? ?Thank you for allowing pharmacy to participate in this patient's care. ? ?Reatha Harps, PharmD ?PGY1 Pharmacy Resident ?09/17/2021 1:29 PM ?Check [email or web address] for unit specific pharmacy number ? ? ?

## 2021-09-17 NOTE — H&P (Signed)
?History and Physical  ? ? ?Eric Beltran. MBW:[id number] DOB: July 08, 1940 DOA: 09/16/2021 ? ?PCP: Celene Squibb, MD  ? ?Patient coming from: Home  ? ?Chief Complaint: SOB, cough, fatigue ? ?HPI: Eric Grimaldo. is a pleasant 81 y.o. male with medical history significant for CAD, atrial fibrillation on Eliquis, sarcoma on chemotherapy, now presenting to the emergency department with shortness of breath and cough.  The patient reports 3 days of shortness of breath, fatigue, and cough.  He has some scant sputum production, has severe fatigue, but has not noticed any subjective fever or chills.  He denies any chest pain.  He denies orthopnea. ? ?ED Course: Upon arrival to the ED, patient is found to be febrile to 38 C and requiring 3 L/min of supplemental oxygen to maintain saturations in the low 90s while at rest.  Chest x-ray concerning for increased patchy opacities bilaterally.  CBC notable for leukocytosis of 25,700.  Lactic acid is normal.  BNP is 280 and troponin 44, similar to priors.  COVID and influenza PCR were negative.  Blood cultures were collected and the patient was treated with acetaminophen, vancomycin, and cefepime. ? ?Review of Systems:  ?All other systems reviewed and apart from HPI, are negative. ? ?Past Medical History:  ?Diagnosis Date  ? Arthritis   ? Ascending aortic aneurysm ([place])   ? Status post repair 12/2011 at [facility]  ? BPH (benign prostatic hyperplasia)   ? Chronic diastolic CHF (congestive heart failure) ([place])   ? Coronary atherosclerosis of native coronary artery   ? a. Multivessel status post prior stenting and ultimately CABG 12/2011 at [facility] with with LIMA to LAD, SVG to PLB, SVG to ramus, SVG to OM . b. Last cath 06/2014 (following ischemic nuc) -> medical therapy, no feasible way to revascularize LCx territory.  ? Essential hypertension   ? Mixed hyperlipidemia   ? Myocardial infarction [facility])   ? PAD (peripheral artery disease) ([place])   ? PAF (paroxysmal atrial fibrillation) ([place])    ? Supraventricular tachycardia ([place])   ? ? ?Past Surgical History:  ?Procedure Laterality Date  ? ASCENDING AORTIC ANEURYSM REPAIR  12/2011 [facility]  ? 26 mm.Dacron graft  ? CORONARY ARTERY BYPASS GRAFT  12/2011 [facility]  ? LIMA to LAD, SVG to PLB, SVG to ramus, SVG to OM  ? CORONARY ARTERY BYPASS GRAFT N/A 08/06/2017  ? Procedure: REDO CORONARY ARTERY BYPASS GRAFTING (CABG) TIMES FOUR UTILIZING LEFT GREATER SAPHENOUS VEIN HAVESTED ENDOVASCULARLY, LEFT RADIAL ARTERY.  RIGHT AXILLARY CANNULATION;  Surgeon: Gaye Pollack, MD;  Location: [place];  Service: Open Heart Surgery;  Laterality: N/A;  ? CORONARY ATHERECTOMY N/A 02/10/2020  ? Procedure: CORONARY ATHERECTOMY;  Surgeon: [name], MD;  Location: [place] CV LAB;  Service: Cardiovascular;  Laterality: N/A;  ? CORONARY BALLOON ANGIOPLASTY N/A 06/10/2020  ? Procedure: CORONARY BALLOON ANGIOPLASTY;  Surgeon: Wellington Hampshire, MD;  Location: [place] CV LAB;  Service: Cardiovascular;  Laterality: N/A;  ? CORONARY STENT INTERVENTION N/A 02/10/2020  ? Procedure: CORONARY STENT INTERVENTION;  Surgeon: [name], MD;  Location: [place] CV LAB;  Service: Cardiovascular;  Laterality: N/A;  ? CORONARY STENT INTERVENTION N/A 07/26/2021  ? Procedure: CORONARY STENT INTERVENTION;  Surgeon: Wellington Hampshire, MD;  Location: [place] CV LAB;  Service: Cardiovascular;  Laterality: N/A;  RCA  ? HERNIA REPAIR    ? INTRAVASCULAR ULTRASOUND/IVUS N/A 07/26/2021  ? Procedure: Intravascular Ultrasound/IVUS;  Surgeon: Wellington Hampshire, MD;  Location: [facility] INVASIVE CV  LAB;  Service: Cardiovascular;  Laterality: N/A;  OCT  ? LEFT HEART CATH AND CORS/GRAFTS ANGIOGRAPHY N/A 07/11/2017  ? Procedure: LEFT HEART CATH AND CORS/GRAFTS ANGIOGRAPHY;  Surgeon: Troy Sine, MD;  Location: [place] CV LAB;  Service: Cardiovascular;  Laterality: N/A;  ? LEFT HEART CATH AND CORS/GRAFTS ANGIOGRAPHY N/A 09/12/2018  ? Procedure: LEFT HEART CATH AND CORS/GRAFTS ANGIOGRAPHY;  Surgeon:  Troy Sine, MD;  Location: [place] CV LAB;  Service: Cardiovascular;  Laterality: N/A;  ? LEFT HEART CATH AND CORS/GRAFTS ANGIOGRAPHY N/A 02/10/2020  ? Procedure: LEFT HEART CATH AND CORS/GRAFTS ANGIOGRAPHY;  Surgeon: [name], MD;  Location: [place] CV LAB;  Service: Cardiovascular;  Laterality: N/A;  ? LEFT HEART CATH AND CORS/GRAFTS ANGIOGRAPHY N/A 06/10/2020  ? Procedure: LEFT HEART CATH AND CORS/GRAFTS ANGIOGRAPHY;  Surgeon: Wellington Hampshire, MD;  Location: [place] CV LAB;  Service: Cardiovascular;  Laterality: N/A;  ? LEFT HEART CATH AND CORS/GRAFTS ANGIOGRAPHY N/A 07/26/2021  ? Procedure: LEFT HEART CATH AND CORS/GRAFTS ANGIOGRAPHY;  Surgeon: Wellington Hampshire, MD;  Location: [place] CV LAB;  Service: Cardiovascular;  Laterality: N/A;  ? LEFT HEART CATHETERIZATION WITH CORONARY ANGIOGRAM N/A 06/18/2014  ? Procedure: LEFT HEART CATHETERIZATION WITH CORONARY ANGIOGRAM;  Surgeon: Blane Ohara, MD;  Location: [facility] CATH LAB;  Service: Cardiovascular;  Laterality: N/A;  ? RADIAL ARTERY HARVEST Left 08/06/2017  ? Procedure: RADIAL ARTERY HARVEST;  Surgeon: Gaye Pollack, MD;  Location: [place];  Service: Open Heart Surgery;  Laterality: Left;  ? TEE WITHOUT CARDIOVERSION N/A 08/06/2017  ? Procedure: TRANSESOPHAGEAL ECHOCARDIOGRAM (TEE);  Surgeon: Gaye Pollack, MD;  Location: [place];  Service: Open Heart Surgery;  Laterality: N/A;  ? TOTAL HIP ARTHROPLASTY Right 02/07/2016  ? Procedure: RIGHT TOTAL HIP ARTHROPLASTY ANTERIOR APPROACH;  Surgeon: Mcarthur Rossetti, MD;  Location: [place];  Service: Orthopedics;  Laterality: Right;  ? TRANSURETHRAL RESECTION OF PROSTATE    ? ? ?Social History:  ? reports that he has never smoked. He has never used smokeless tobacco. He reports current alcohol use. He reports that he does not use drugs. ? ?Allergies  ?Allergen Reactions  ? Chocolate Flavor Other (See Comments)  ?  Stuffy nose   ? ? ?Family History  ?Problem Relation Age of Onset  ? Hypertension  Father   ? Parkinson's disease Father   ? Hypertension Mother   ? Lung cancer Sister   ? ? ? ?Prior to Admission medications   ?Medication Sig Start Date End Date Taking? Authorizing Provider  ?amiodarone (PACERONE) 200 MG tablet TAKE 1/2 TABLET BY MOUTH DAILY (cut in half) ?Patient taking differently: Take 100 mg by mouth See admin instructions. 09/01/21   Satira Sark, MD  ?apixaban (ELIQUIS) 5 MG TABS tablet TAKE 1 TABLET BY MOUTH TWICE DAILY ?Patient taking differently: Take 2.5 mg by mouth 2 (two) times daily. 08/28/21   Satira Sark, MD  ?Ascorbic Acid (VITAMIN C) 500 MG CAPS Take 1 tablet by mouth daily.    [provider]  ?atorvastatin (LIPITOR) 80 MG tablet Take 1 tablet (80 mg total) by mouth at bedtime. 08/28/21   Satira Sark, MD  ?clopidogrel (PLAVIX) 75 MG tablet TAKE 1 TABLET BY MOUTH DAILY WITH BREAKFAST ?Patient taking differently: Take 75 mg by mouth daily. 06/28/21   Satira Sark, MD  ?diltiazem (CARDIZEM CD) 240 MG 24 hr capsule TAKE ONE CAPSULE BY MOUTH DAILY ?Patient taking differently: 240 mg daily. 03/30/21   Rozann Lesches  G, MD  ?furosemide (LASIX) 20 MG tablet TAKE TWO TABLETS BY MOUTH DAILY 07/28/21   Satira Sark, MD  ?isosorbide mononitrate (IMDUR) 60 MG 24 hr tablet TAKE 1 TABLET BY MOUTH TWICE DAILY every morning AND in THE evening ?Patient taking differently: 60 mg 2 (two) times daily. 05/29/21   Satira Sark, MD  ?losartan (COZAAR) 50 MG tablet Take 1 tablet (50 mg total) by mouth daily. 06/13/21   Satira Sark, MD  ?metoprolol tartrate (LOPRESSOR) 25 MG tablet Take 25 mg by mouth 2 (two) times daily.    [provider]  ?Multiple Vitamin (MULTIVITAMIN) tablet Take 1 tablet by mouth at bedtime.    [provider]  ?nitroGLYCERIN (NITROSTAT) 0.4 MG SL tablet DISSOLVE 1 TABLET UNDER THE TONGUE EVERY 5 MINUTES AS NEEDED FOR CHEST PAIN. DO NOT EXCEED A TOTAL OF 3 DOSES IN 15 MINUTES. ?Patient taking differently: Place 0.4  mg under the tongue every 5 (five) minutes as needed for chest pain. 06/13/20   Satira Sark, MD  ?Omega-3 Fatty Acids (FISH OIL) 1000 MG CAPS Take 2,000 mg by mouth daily.    Provider, Historical,

## 2021-09-17 NOTE — Assessment & Plan Note (Addendum)
Peripheral blood cultures positive for staph epidermidis 3/4 bottles, resistant to methicillin.  ?Complicated with severe sepsis, present on admission.  ? ?Patient has been afebrile for the last 14 hours. Wbc is down to 20.6  ? ?Follow up blood cultures with no growth.  ? ?Acute  Hypoxemic respiratory failure, bilateral pulmonary infiltrates, pneumonia associated with bacteremia, possible early ARDS, acute cardiogenic pulmonary edema (acue on chronic diastolic heart failure).   ? ?Echocardiogram with preserved LV systolic function, EF 60 to 65%, with severe left ventricular hypertrophy, preserved RV systolic function, RVSP 16.6, moderate left atrial enlargement.  ? ?Patient with increased 02 requirements, last night required non invasive mechanical ventilation with Bipap. Had furosemide with some improvement, this am is on high flow nasal cannula at 15 L/min with 02 saturation 94%.  ? ?Chest film personally reviewed noted worsening bilateral interstitial infiltrates bilateral upper lobes and lower lobes  with cephalization of the vasculature.  ? ?Urine output documented 550 ml.  ? ?Plan to add furosemide 60 mg today and monitor response.  ?Continue antibiotic therapy, if worsening respiratory failure may need higher level of care.  ?

## 2021-09-17 NOTE — Assessment & Plan Note (Signed)
No chest pain, continue blood pressure control and antiplatelet therapy with clopidogrel.  ?

## 2021-09-17 NOTE — Progress Notes (Signed)
Patient c/o chills, temp 99.37F. Patient Spo2 81% on 3L [place]. Increased to 6L. Tylenol given. RT called. MD made aware requested nebs to help expand pt lungs. RT called for HFNC and neb. Will continue to monitor pt closely.  ?

## 2021-09-17 NOTE — Progress Notes (Signed)
Coached patient with the incentive spirometer.  Patient gave good effort.  1500.  Patient will continue to do breathing exercises while awake. ?

## 2021-09-17 NOTE — Assessment & Plan Note (Addendum)
Continue to hold on antihypertensive agents. ?Diuresis with furosemide as tolerated for acute pulmonary edema.  ?Blood pressure systolic 403 to 709 mmHg.  ? ?

## 2021-09-17 NOTE — Assessment & Plan Note (Addendum)
Sp right AKA. Positive pulmonary metastasis.  ?Undergoing chemotherapy as outpatient with [facility], with Dr. Lendon Collar.  ? ?I spoke with cancer center [facility] in [place], and informed about patient with bacteremia and plan to remove Port.  ? ?Cancer center will follow up after discharge.  ? ? ? ?

## 2021-09-17 NOTE — Plan of Care (Signed)
  Problem: Activity: Goal: Ability to tolerate increased activity will improve Outcome: Progressing   Problem: Clinical Measurements: Goal: Ability to maintain a body temperature in the normal range will improve Outcome: Progressing   Problem: Respiratory: Goal: Ability to maintain adequate ventilation will improve Outcome: Progressing Goal: Ability to maintain a clear airway will improve Outcome: Progressing   

## 2021-09-17 NOTE — Progress Notes (Signed)
?   09/17/21 0450  ?Assess: MEWS Score  ?Temp (!) 100.4 ?F (38 ?C)  ?BP 120/64  ?Pulse Rate 88  ?ECG Heart Rate 89  ?Resp (!) 29  ?Level of Consciousness Alert  ?SpO2 94 %  ?O2 Device HFNC  ?O2 Flow Rate (L/min) 10 L/min  ?Assess: MEWS Score  ?MEWS Temp 0  ?MEWS Systolic 0  ?MEWS Pulse 0  ?MEWS RR 2  ?MEWS LOC 0  ?MEWS Score 2  ?MEWS Score Color Yellow  ?Assess: if the MEWS score is Yellow or Red  ?Were vital signs taken at a resting state? Yes  ?Focused Assessment No change from prior assessment  ?Early Detection of Sepsis Score *See Row Information* High  ?MEWS guidelines implemented *See Row Information* Yes  ?Treat  ?MEWS Interventions Administered prn meds/treatments;Escalated (See documentation below);Consulted Respiratory Therapy  ?Pain Scale 0-10  ?Pain Score 0  ?Take Vital Signs  ?Increase Vital Sign Frequency  Yellow: Q 2hr X 2 then Q 4hr X 2, if remains yellow, continue Q 4hrs  ?Escalate  ?MEWS: Escalate Yellow: discuss with charge nurse/RN and consider discussing with provider and RRT  ?Notify: Charge Nurse/RN  ?Name of Charge Nurse/RN Notified Josph Macho  ?Date Charge Nurse/RN Notified 09/17/21  ?Time Charge Nurse/RN Notified [phone]  ?Notify: Provider  ?Provider Name/Title Dr. Cyd Silence  ?Date Provider Notified 09/17/21  ?Time Provider Notified [phone]  ?Notification Type Page  ?Notification Reason Change in status  ?Provider response Evaluate remotely  ?Date of Provider Response 09/17/21  ?Document  ?Patient Outcome Not stable and remains on department  ? ? ?RT came to bedside to eval patient. Patient placed on salter on 10L HFNC. Sats improved. Patient not actively in respiratory distress, but increased RR. Patient with chills and trembling. Tylenol given. Spoke with MD, no additional orders at this time. Will continue to monitor patient for respiratory distress.  ?

## 2021-09-18 ENCOUNTER — Inpatient Hospital Stay (HOSPITAL_COMMUNITY): Payer: Medicare Other

## 2021-09-18 ENCOUNTER — Other Ambulatory Visit: Payer: Self-pay

## 2021-09-18 DIAGNOSIS — T80211A Bloodstream infection due to central venous catheter, initial encounter: Secondary | ICD-10-CM | POA: Diagnosis not present

## 2021-09-18 DIAGNOSIS — R7881 Bacteremia: Secondary | ICD-10-CM | POA: Diagnosis not present

## 2021-09-18 DIAGNOSIS — J9621 Acute and chronic respiratory failure with hypoxia: Secondary | ICD-10-CM | POA: Diagnosis not present

## 2021-09-18 DIAGNOSIS — E876 Hypokalemia: Secondary | ICD-10-CM

## 2021-09-18 DIAGNOSIS — C499 Malignant neoplasm of connective and soft tissue, unspecified: Secondary | ICD-10-CM | POA: Diagnosis not present

## 2021-09-18 DIAGNOSIS — B957 Other staphylococcus as the cause of diseases classified elsewhere: Secondary | ICD-10-CM

## 2021-09-18 DIAGNOSIS — I480 Paroxysmal atrial fibrillation: Secondary | ICD-10-CM | POA: Diagnosis not present

## 2021-09-18 DIAGNOSIS — I10 Essential (primary) hypertension: Secondary | ICD-10-CM | POA: Diagnosis not present

## 2021-09-18 DIAGNOSIS — J189 Pneumonia, unspecified organism: Secondary | ICD-10-CM | POA: Diagnosis not present

## 2021-09-18 DIAGNOSIS — I25709 Atherosclerosis of coronary artery bypass graft(s), unspecified, with unspecified angina pectoris: Secondary | ICD-10-CM | POA: Diagnosis not present

## 2021-09-18 LAB — BASIC METABOLIC PANEL
Anion gap: 9 (ref 5–15)
BUN: 12 mg/dL (ref 8–23)
CO2: 20 mmol/L — ABNORMAL LOW (ref 22–32)
Calcium: 8.1 mg/dL — ABNORMAL LOW (ref 8.9–10.3)
Chloride: 107 mmol/L (ref 98–111)
Creatinine, Ser: 0.78 mg/dL (ref 0.61–1.24)
GFR, Estimated: >60 mL/min (ref >60)
Glucose, Bld: 120 mg/dL — ABNORMAL HIGH (ref 70–99)
Potassium: 3.3 mmol/L — ABNORMAL LOW (ref 3.5–5.1)
Sodium: 136 mmol/L (ref 135–145)

## 2021-09-18 LAB — CBC
HCT: 31.6 % — ABNORMAL LOW (ref 39.0–52.0)
Hemoglobin: 9.9 g/dL — ABNORMAL LOW (ref 13.0–17.0)
MCH: 29.9 pg (ref 26.0–34.0)
MCHC: 31.3 g/dL (ref 30.0–36.0)
MCV: 95.5 fL (ref 80.0–100.0)
Platelets: 321 10*3/uL (ref 150–400)
RBC: 3.31 MIL/uL — ABNORMAL LOW (ref 4.22–5.81)
RDW: 19.1 % — ABNORMAL HIGH (ref 11.5–15.5)
WBC: 27.7 10*3/uL — ABNORMAL HIGH (ref 4.0–10.5)
nRBC: 0.2 % (ref 0.0–0.2)

## 2021-09-18 LAB — TROPONIN I (HIGH SENSITIVITY): Troponin I (High Sensitivity): 31 ng/L — ABNORMAL HIGH (ref <18)

## 2021-09-18 LAB — ECHOCARDIOGRAM COMPLETE
AR max vel: 4.03 cm2
AV Peak grad: 7.2 mmHg
Ao pk vel: 1.34 m/s
Area-P 1/2: 3.99 cm2
Height: 67 in
S' Lateral: 3.2 cm
Weight: 2790.14 oz

## 2021-09-18 LAB — MAGNESIUM: Magnesium: 2 mg/dL (ref 1.7–2.4)

## 2021-09-18 MED ORDER — LIP MEDEX EX OINT
1.0000 "application " | TOPICAL_OINTMENT | CUTANEOUS | Status: DC | PRN
  Administered 2021-09-18: 1 via TOPICAL
  Filled 2021-09-18: qty 7

## 2021-09-18 MED ORDER — POTASSIUM CHLORIDE 10 MEQ/100ML IV SOLN
10.0000 meq | INTRAVENOUS | Status: AC
  Administered 2021-09-18 (×3): 10 meq via INTRAVENOUS
  Filled 2021-09-18 (×3): qty 100

## 2021-09-18 MED ORDER — SODIUM CHLORIDE 0.9 % IV SOLN: INTRAVENOUS | Status: DC

## 2021-09-18 MED ORDER — NITROGLYCERIN 0.4 MG SL SUBL
SUBLINGUAL_TABLET | SUBLINGUAL | Status: AC
Start: 2021-09-18 — End: 2021-09-18
  Filled 2021-09-18: qty 3

## 2021-09-18 MED ORDER — SALINE SPRAY 0.65 % NA SOLN
1.0000 | NASAL | Status: DC | PRN
  Filled 2021-09-18: qty 44

## 2021-09-18 MED ORDER — POTASSIUM CHLORIDE CRYS ER 20 MEQ PO TBCR
40.0000 meq | EXTENDED_RELEASE_TABLET | Freq: Once | ORAL | Status: AC
  Administered 2021-09-18: 40 meq via ORAL
  Filled 2021-09-18: qty 2

## 2021-09-18 MED ORDER — NITROGLYCERIN 0.4 MG SL SUBL
0.4000 mg | SUBLINGUAL_TABLET | SUBLINGUAL | Status: AC | PRN
  Administered 2021-09-18 – 2021-09-21 (×4): 0.4 mg via SUBLINGUAL
  Filled 2021-09-18: qty 1

## 2021-09-18 MED ORDER — FUROSEMIDE 10 MG/ML IJ SOLN
20.0000 mg | Freq: Once | INTRAMUSCULAR | Status: AC
  Administered 2021-09-18: 20 mg via INTRAVENOUS
  Filled 2021-09-18: qty 2

## 2021-09-18 NOTE — Consult Note (Signed)
?   ? ? ? ? ?[name]   ? ?Date of Admission:  09/16/2021    ? ?Reason for Consult: bsi/clabsi    ?Referring Provider: Arrien ? ? ? ? ? ?Lines:  ?Right chest chronic chemo port ? ?Abx: ?5/6-c vanc ? ?5/6-8 ceftriaxone      ?5/07 azith ? 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 ?Agree bsi likely clabsi. Given cancer history at risk for IE and will need to check that out ? ?Agree would need to remove the port for improved chance clearing the bacteremia. Certainly right sided endocarditis with pulm involvement is possible in terms of his pulm parenchymal opacity ? ?Unclear what the cough was; getting better; cxr on 5/6 perihilar interstitial opacity and cardiomegaly ?fluid. Procal < 0.5 x2 ? ? ?Plan: ?Discussed with primary team ?Continue vancomycin for now; stop ceftriaxone  ?Repeat bcx cooking ?Tte ordered ?Please discuss with onc about port removal ? ? ?I spent 80 minute reviewing data/chart, and coordinating care and >50% direct face to face time providing counseling/discussing diagnostics/treatment plan with patient ? ? ? ? ? ?------------------------------------------------ ?Active Problems: ?  Essential hypertension ?  Coronary artery disease involving coronary bypass graft of native heart with angina pectoris ([place]) ?  Paroxysmal atrial fibrillation ([facility]) ?  Pneumonia ?  Sarcoma ([place]) ?  Hypokalemia ? ? ? ?HPI: Eric Beltran. is a 81 y.o. male cad, afib on eliquis, sarcoma on chemo, admitted 5/6 for 3 days malaise, dyspnea, dry cough ? ?His last chemo was about 2 weeks prior to this admission ?He has a recent chest/abd/pelv ct f/u on 5/05 that demonstrate mostly improved pulm nodules, but thickening  pulm interstitium upper and mid zones with patchy ggo along with multiple sub-centimeter mediastinal lymph nodes larger than previously but not exceeding pathologic size criteria, right renal mass, stable 2 lesion sin liver ? ?He sees [facility] for cancer [facility] ? ?He was well until 3 days ago when sx appear ?Denies fever/chill ? ?He has a port -- no issue with local pain/redness ? ?No other focal pain ?No rash ?No headache ?No n/v ?No swelling ? ? ?On admission febrile; wbc to 28 ?Bcx with staph epi ?Repeated bcx in progress ?Started on bsAbx narrowed to vanc today ? ?Cough improving ?Fever resolved ?Wbc slightly up today after initial slight improvement) ? ? ? ? ? ?Family History  ?Problem Relation Age of Onset  ? Hypertension Father   ? Parkinson's disease Father   ? Hypertension Mother   ? Lung cancer Sister   ? ? ?Social History  ? ?Tobacco Use  ? Smoking status: Never  ? Smokeless tobacco: Never  ? Tobacco comments:  ?  tobacco use - no  ?Vaping Use  ? Vaping Use: Never used  ?Substance Use Topics  ? Alcohol use: Yes  ?  Alcohol/week: 0.0 standard drinks  ?  Comment: Occasional  ? Drug use: No  ? ? ?No Active Allergies ? ?Review of Systems: ?ROS ?All Other ROS was negative, except mentioned above ? ? ?Past Medical History:  ?Diagnosis Date  ? Arthritis   ? Ascending aortic aneurysm ([place])   ? Status post repair 12/2011 at [facility]  ? BPH (benign prostatic hyperplasia)   ?  Chronic diastolic CHF (congestive heart failure) ([place])   ? Coronary atherosclerosis of native coronary artery   ? a. Multivessel status post prior stenting and ultimately CABG 12/2011 at [facility] with with LIMA to LAD, SVG to PLB, SVG to ramus, SVG to OM . b. Last cath 06/2014 (following ischemic nuc) -> medical therapy, no feasible way to revascularize LCx territory.  ? Essential hypertension   ? Mixed hyperlipidemia   ? Myocardial infarction [facility])   ? PAD (peripheral artery disease) ([place])   ? PAF (paroxysmal atrial fibrillation) ([place])   ?  Supraventricular tachycardia ([place])   ? ? ? ? ? ?Scheduled Meds: ? amiodarone  100 mg Oral Daily  ? apixaban  2.5 mg Oral BID  ? atorvastatin  80 mg Oral QHS  ? clopidogrel  75 mg Oral Q breakfast  ? diltiazem  240 mg Oral Daily  ? pantoprazole  40 mg Oral Daily  ? sodium chloride flush  3 mL Intravenous Q12H  ? sucralfate  1 g Oral TID WC & HS  ? tamsulosin  0.4 mg Oral QHS  ? ?Continuous Infusions: ? sodium chloride    ? vancomycin 1,250 mg (09/17/21 2151)  ? ?PRN Meds:.acetaminophen **OR** acetaminophen, albuterol, lip balm, nitroGLYCERIN, ondansetron **OR** ondansetron (ZOFRAN) IV, oxyCODONE, senna-docusate, sodium chloride ? ? ?OBJECTIVE: ?Blood pressure 107/62, pulse 78, temperature (!) 97.5 ?F (36.4 ?C), temperature source Oral, resp. rate 20, height '5\' 7"'$  (1.702 m), weight 79.1 kg, SpO2 93 %. ? ?Physical Exam ?General/constitutional: no distress, pleasant ?HEENT: Normocephalic, PER, Conj Clear, EOMI, Oropharynx clear ?Neck supple ?CV: rrr no mrg ?Lungs: clear to auscultation, normal respiratory effort ?Abd: Soft, Nontender ?Ext: no edema ?Skin: No Rash ?Neuro: nonfocal ?MSK: s/p right bka ? ? ?Central line presence:yes right chest port -- site no erythema/fluctuance/tenderness; not accessed ? ? ?Lab Results ?Lab Results  ?Component Value Date  ? WBC 27.7 (H) 09/18/2021  ? HGB 9.9 (L) 09/18/2021  ? HCT 31.6 (L) 09/18/2021  ? MCV 95.5 09/18/2021  ? PLT 321 09/18/2021  ?  ?Lab Results  ?Component Value Date  ? CREATININE 0.78 09/18/2021  ? BUN 12 09/18/2021  ? NA 136 09/18/2021  ? K 3.3 (L) 09/18/2021  ? CL 107 09/18/2021  ? CO2 20 (L) 09/18/2021  ?  ?Lab Results  ?Component Value Date  ? ALT 28 09/14/2021  ? AST 24 09/14/2021  ? ALKPHOS 144 (H) 09/14/2021  ? BILITOT 0.8 09/14/2021  ?  ? ? ?Microbiology: ?Recent Results (from the past 240 hour(s))  ?Blood culture (routine x 2)     Status: Abnormal (Preliminary result)  ? Collection Time: 09/16/21  8:03 PM  ? Specimen: BLOOD RIGHT ARM  ?Result Value Ref Range  Status  ? Specimen Description BLOOD RIGHT ARM  Final  ? Special Requests   Final  ?  BOTTLES DRAWN AEROBIC AND ANAEROBIC Blood Culture adequate volume  ? Culture  Setup Time   Final  ?  GRAM POSITIVE COCCI IN CLUSTERS ?IN BOTH AEROBIC AND ANAEROBIC BOTTLES ?CRITICAL VALUE NOTED.  VALUE IS CONSISTENT WITH PREVIOUSLY REPORTED AND CALLED VALUE. ?Performed at [facility], [place] [address], [place], [place] [address] ?  ? Culture (A)  Final  ?  STAPHYLOCOCCUS HOMINIS ?STAPHYLOCOCCUS EPIDERMIDIS ?  ? Report Status PENDING  Incomplete  ?Resp Panel by RT-PCR (Flu A&B, Covid)     Status: None  ? Collection Time: 09/16/21  8:04 PM  ? Specimen: Nasopharyngeal(NP) swabs in vial transport medium  ?Result Value  Ref Range Status  ? SARS Coronavirus 2 by RT PCR NEGATIVE NEGATIVE Final  ?  Comment: (NOTE) ?SARS-CoV-2 target nucleic acids are NOT DETECTED. ? ?The SARS-CoV-2 RNA is generally detectable in upper respiratory ?specimens during the acute phase of infection. The lowest ?concentration of SARS-CoV-2 viral copies this assay can detect is ?138 copies/mL. A negative result does not preclude SARS-Cov-2 ?infection and should not be used as the sole basis for treatment or ?other patient management decisions. A negative result may occur with  ?improper specimen collection/handling, submission of specimen other ?than nasopharyngeal swab, presence of viral mutation(s) within the ?areas targeted by this assay, and inadequate number of viral ?copies(<138 copies/mL). A negative result must be combined with ?clinical observations, patient history, and epidemiological ?information. The expected result is Negative. ? ?Fact Sheet for Patients:  ?EntrepreneurPulse.com.au ? ?Fact Sheet for Healthcare Providers:  ?IncredibleEmployment.be ? ?This test is no t yet approved or cleared by the [place] FDA and  ?has been authorized for detection and/or diagnosis of SARS-CoV-2 by ?FDA under an Emergency  Use Authorization (EUA). This EUA will remain  ?in effect (meaning this test can be used) for the duration of the ?COVID-19 declaration under Section 564(b)(1) of the Act, 21 ?U.S.C.section 360bbb-3(b)(

## 2021-09-18 NOTE — Progress Notes (Addendum)
?   09/18/21 0308  ?Assess: MEWS Score  ?BP (!) 147/79  ?Pulse Rate (!) 128  ?ECG Heart Rate (!) 130  ?Resp (!) 23  ?SpO2 (!) 88 %  ?Assess: MEWS Score  ?MEWS Temp 0  ?MEWS Systolic 0  ?MEWS Pulse 3  ?MEWS RR 1  ?MEWS LOC 0  ?MEWS Score 4  ?MEWS Score Color Red  ?Assess: if the MEWS score is Yellow or Red  ?Were vital signs taken at a resting state? Yes  ?Focused Assessment Change from prior assessment (see assessment flowsheet)  ?Early Detection of Sepsis Score *See Row Information* High  ?MEWS guidelines implemented *See Row Information* Yes  ?Treat  ?MEWS Interventions Escalated (See documentation below);Consulted Respiratory Therapy;Administered prn meds/treatments  ?Pain Scale 0-10  ?Pain Score 5  ?Pain Type Acute pain  ?Pain Location Chest  ?Pain Orientation Mid  ?Pain Intervention(s) MD notified (Comment)  ?Take Vital Signs  ?Increase Vital Sign Frequency  Red: Q 1hr X 4 then Q 4hr X 4, if remains red, continue Q 4hrs  ?Escalate  ?MEWS: Escalate Red: discuss with charge nurse/RN and provider, consider discussing with RRT  ?Notify: Charge Nurse/RN  ?Name of Charge Nurse/RN Notified Josph Macho  ?Date Charge Nurse/RN Notified 09/18/21  ?Time Charge Nurse/RN Notified 0320  ?Notify: Provider  ?Provider Name/Title Dr. Cyd Silence  ?Date Provider Notified 09/18/21  ?Time Provider Notified 0320  ?Notification Type Page  ?Notification Reason Change in status  ?Document  ?Patient Outcome Not stable and remains on department  ?Progress note created (see row info) Yes  ? ?RN noticed telemetry monitor alarming, HR 140s. EKG obtained, wide QRS tachy. MD paged x2. Patient c/o 5/10 chest pain, patient shaking. Spo2 desat to 80s on 12L HFNC. Patient's oxygen increased to 15L HFNC + non-rebreather 15L. Chest pain protocol initiated, Nitro x1 given. Patient with Chest pain relief. HR now 90s-100s. Remains on higher levels of O2.  ? ?Update 0530 ?Spoke with Dr. Cyd Silence, orders for troponin. Patient's oxygen de-escalated to 12L HFNC. No  reports of chest pain, HR 90s NSR. MD to come evaluate pt at bedside. ?

## 2021-09-18 NOTE — Progress Notes (Signed)
Follow up chest radiograph personally reviewed with positive signs of acute pulmonary edema. (Acute diastolic heart failure).  ?Plan for one dose of IV furosemide 20 mg and follow up on response ?Continue oxymetry monitoring and supplemental 02 per [place] to keep 02 saturation 92% or greater.  ?

## 2021-09-18 NOTE — Progress Notes (Signed)
?Progress Note ? ? ?Patient: Eric Beltran. KDT:[id number] DOB: [birth year] DOA: 09/16/2021     2 ?DOS: the patient was seen and examined on 09/18/2021 ?  ?Brief hospital course: ?Eric Beltran was admitted to the hospital with the working diagnosis of pneumonia complicated with acute hypoxemic respiratory failure.  ? ?81 yo male with the past medical history of coronary artery disease, atrial fibrillation, and sarcoma presented with cough, fatigue and dyspnea. Reported 3 days of persistent symptoms, that prompted him to come to the hospital. On his initial physical examination his blood pressure was 132/69. HR 91. RR 34. 02 saturation 91% and temp 38 C. Lungs with bilateral rales, with no wheezing, heart with S1 and S2 present and rhythmic, abdomen not distended and pitting edema at the left lower extremity.   ? ?Na 134, K 3,6 cl 104, bicarbonate 18, glucose 136, bun 12 cr 1,0 ?Mg 1,9  ?Venous pH 7,54 with Pco2 22,1  ?High sensitive troponin 44 and 45  ?Wbc 25,7 hgb 10,2 plt 217  ?Sars covid 19 negative  ? ?Chest radiograph with right upper lobe and left lower lobe interstitial infiltrate. Port in place on right IJ vein.  ? ?EKG 101 bpm, with normal axis, right bundle branch block, sinus rhythm with q wave lead III and AvF, with no significant ST segment or T wave changes.  ? ?Blood cultures positive for staphylococcus epidermidis 3/4 bottles resistant to methicillin.  ?Possible port infection, placed on IV vancomycin and plan to consult ID for possible port removal.   ? ?Assessment and Plan: ?Pneumonia ?Peripheral blood cultures positive for staph epidermidis 3/4 bottles, resistant to methicillin.  ? ?Possible port infection complicated with staph epidermidis bacteremia.  ? ?Wbc 27,7, T max 101.4 to 100.2 F ? ?Continue antibiotic therapy with ceftriaxone and vancomycin. ?Plan to consult ID, patient may need port extraction.  ?Follow up on repeat blood cultures.  ? ?Acute  Hypoxemic respiratory failure.  ?Patient with  worsening oxygen requirements up to 10 L per min per HFNC, with 02 saturation 92%. ?Plan to get chest film today.  ? ? ?Paroxysmal atrial fibrillation (HCC) ?Continue rate control with diltiazem and amiodarone.  ?Anticoagulation with apixaban.  ? ?Coronary artery disease involving coronary bypass graft of native heart with angina pectoris ([place]) ?No chest pain, continue blood pressure control and antiplatelet therapy with clopidogrel.  ? ?Essential hypertension ?Blood pressure 118/62 mmHg ? ?Plan to hold on antihypertensive regimen, losartan and isosorbide to prevent hypotension.  ? ?Sarcoma ([place]) ?Sp right AKA. Positive pulmonary metastasis.  ?Undergoing chemotherapy.  ? ?Hypokalemia ?Renal function with serum cr at 0,78, K is 3,3 and serum bicarbonate at 20. ?Patient had 70 meq Kcl this am.  ? ?Follow up renal function in am, avoid hypotension and nephrotoxic medications ?Hold on IV fluids for now, patient is tolerating po well.  ? ? ? ? ? ?  ? ?Subjective: Patient with dyspnea and chest pain last night with worsening oxygen requirements. This am with improvement in his symptoms.  ? ?Physical Exam: ?Vitals:  ? 09/18/21 0656 09/18/21 0730 09/18/21 0754 09/18/21 0923  ?BP:  130/70    ?Pulse: 95 95  88  ?Resp: (!) 22 (!) 28  19  ?Temp:  99.9 ?F (37.7 ?C) 99.9 ?F (37.7 ?C)   ?TempSrc:  Oral Oral   ?SpO2: 94% 92%  94%  ?Weight:      ?Height:      ? ?Neurology awake and alert ?ENT with no pallor ?Cardiovascular with S1  and S2 present and rhythmic with no gallops or murmurs ?No JVD ?Mild left lower extremity edema ?Respiratory with scattered rales and rhonchi ?Abdomen with no distention  ?Data Reviewed: ? ? ? ?Family Communication: no family at the bedside  ? ?Disposition: ?Status is: Inpatient ?Remains inpatient appropriate because: IV antibiotic therapy and bacteremia  ? Planned Discharge Destination: Home ? ? ? ?Author: ?Tawni Millers, MD ?09/18/2021 9:47 AM ? ?For on call review [email or web address].  ?

## 2021-09-18 NOTE — Progress Notes (Signed)
?   09/18/21 1545  ?Clinical Encounter Type  ?Visited With Family  ?Visit Type Initial;Other (Comment) ?(Adavanced Directive)  ?Referral From Nurse ?(Advanced Directive)  ?Consult/Referral To Chaplain  ? ?Chaplain responded to a spiritual consult for an advanced directive. Patient had several guests with him in the room and asked if I could possibly return. I offered to leave the paperwork, the patient, Eric Beltran was appreciative of that and stated that he would fill it out later and if he had questions he would reach out.  ? ?Danice Goltz ?Chaplain  ?[facility]  ?[phone] ?

## 2021-09-18 NOTE — Progress Notes (Signed)
HOSPITAL MEDICINE OVERNIGHT EVENT NOTE   ? ?Notified by nursing that shortly after 3 AM patient began to exhibit sudden onset wide-complex tachycardia with heart rates in excess of 140 bpm.  Patient was concurrently complaining of shortness of breath and mild chest discomfort. ? ?Stat EKG obtained revealing wide-complex tachycardia with known right bundle branch block with heart rate of 142 bpm.  This is likely SVT. ? ?Patient had temporarily be placed on nonrebreather as patient oxygen saturations dipped into the 80s.  Nursing also quickly gave the patient a dose of sublingual nitroglycerin resulting in relief of the patient's chest discomfort and conversion back into sinus rhythm with heart rates in the 90s. ? ?Stat troponin obtained along with morning chemistry revealing minimally elevated troponin of 31, lower than previous troponin levels obtained on 5/6.  We will obtain an additional troponin level in several hours to ensure that this trajectory remains flat. ? ?Potassium is also noted to be low at 3.3.  We will go ahead and administer 40 equivalents of oral potassium chloride and 30 mill equivalents of intravenous potassium chloride.  Also adding on a magnesium level to morning labs which should be followed up by day team and replaced as necessary. ? ?Continuing to treat pneumonia and possible line infection with intravenous antibiotics per day provider note.  Continue to monitor closely. ? ?Vernelle Emerald  MD ?[facility]  ? ? ? ? ? ? ? ? ? ? ?

## 2021-09-18 NOTE — Assessment & Plan Note (Addendum)
Hyponatremia and hypokalemia ? ?Renal function with serum cr at 0,81, K is 3,3 and serum bicarbonate a 19, with Na 134. ? ?Plan to continue K correction with oral Kcl, today will get 40 meq KCl x2 and one dose of 10 meq KCL lIV. ?Continue diuresis for pulmonary edema and follow up renal function and electrolytes in am,   ? ?

## 2021-09-18 NOTE — Progress Notes (Signed)
Request received for PAC removal due to bacteremia.  ? ?Case reviewed and approve by Dr. Annamaria Boots.  ? ?Patient seen in the room today.  ?Sitting in bed, NAD. Has lunch tray.  ?States that he is doing ok,he feels tired as he has not been able to sleep well in the hospital. Denies other complaints.  ? ?States that he just got the PAC 4 moths ago at [facility] for sarcoma treatment.  ?Upon eval, the PAC site w/o s/s of acute skin infection.  ?Skin is clean, dry, not erythematous, no fluctuance, non tender.  ? ?After thorough discussion of benefit and risk of PAC removal with local anesthetic only VS with conscious sedation, patient decided to proceed with local anesthetic only.  ?Written informed consent obtain and placed in chart.  ? ?IR will call for the patient when schedule permits, possibly this afternoon or tomorrow.  ?Please call IR for questions and concerns.  ? ? ?Eric Gang Azarah Dacy PA-C ?09/18/2021 1:02 PM ? ? ? ?

## 2021-09-18 NOTE — Progress Notes (Signed)
RT to see pt for increased oxygen needs. Upon arrival to pt room, pt visiting with family, no distress noted. Pt able to speak in full sentences at this time. Bilateral breath sounds clear with a faint expiratory wheeze in the R upper lobe. Pt endorses nonproductive cough at this time. Pt SpO2 on 14L 92-94%. RT will continue to monitor and be available as needed.  ?

## 2021-09-18 NOTE — TOC Progression Note (Signed)
Transition of Care (TOC) - Progression Note  ? ? ?Patient Details  ?Name: Eric Beltran. ?MRN: [id number] ?Date of Birth: 1941/03/23 ? ?Transition of Care (TOC) CM/SW Contact  ?Zenon Mayo, RN ?Phone Number: ?09/18/2021, 5:41 PM ? ?Clinical Narrative:    ? ?Transition of Care (TOC) Screening Note ? ? ?Patient Details  ?Name: Eric Beltran. ?Date of Birth: 06/10/1940 ? ? ?Transition of Care (TOC) CM/SW Contact:    ?Zenon Mayo, RN ?Phone Number: ?09/18/2021, 5:41 PM ? ? ? ?Transition of Care Department [facility]) has reviewed patient and no TOC needs have been identified at this time. We will continue to monitor patient advancement through interdisciplinary progression rounds. If new patient transition needs arise, please place a TOC consult. ?  ? ? ?  ?  ? ?Expected Discharge Plan and Services ?  ?  ?  ?  ?  ?                ?  ?  ?  ?  ?  ?  ?  ?  ?  ?  ? ? ?Social Determinants of Health (SDOH) Interventions ?  ? ?Readmission Risk Interventions ?   ? View : No data to display.  ?  ?  ?  ? ? ?

## 2021-09-19 ENCOUNTER — Other Ambulatory Visit: Payer: Self-pay

## 2021-09-19 ENCOUNTER — Inpatient Hospital Stay (HOSPITAL_COMMUNITY): Payer: Medicare Other

## 2021-09-19 DIAGNOSIS — T80211A Bloodstream infection due to central venous catheter, initial encounter: Secondary | ICD-10-CM | POA: Diagnosis not present

## 2021-09-19 DIAGNOSIS — I25709 Atherosclerosis of coronary artery bypass graft(s), unspecified, with unspecified angina pectoris: Secondary | ICD-10-CM | POA: Diagnosis not present

## 2021-09-19 DIAGNOSIS — I480 Paroxysmal atrial fibrillation: Secondary | ICD-10-CM | POA: Diagnosis not present

## 2021-09-19 DIAGNOSIS — I10 Essential (primary) hypertension: Secondary | ICD-10-CM | POA: Diagnosis not present

## 2021-09-19 HISTORY — PX: IR REMOVAL TUN CV CATH W/O FL: IMG2289

## 2021-09-19 LAB — LEGIONELLA PNEUMOPHILA SEROGP 1 UR AG: L. pneumophila Serogp 1 Ur Ag: NEGATIVE

## 2021-09-19 LAB — BASIC METABOLIC PANEL
Anion gap: 9 (ref 5–15)
BUN: 12 mg/dL (ref 8–23)
CO2: 19 mmol/L — ABNORMAL LOW (ref 22–32)
Calcium: 7.7 mg/dL — ABNORMAL LOW (ref 8.9–10.3)
Chloride: 106 mmol/L (ref 98–111)
Creatinine, Ser: 0.81 mg/dL (ref 0.61–1.24)
GFR, Estimated: >60 mL/min (ref >60)
Glucose, Bld: 136 mg/dL — ABNORMAL HIGH (ref 70–99)
Potassium: 3.3 mmol/L — ABNORMAL LOW (ref 3.5–5.1)
Sodium: 134 mmol/L — ABNORMAL LOW (ref 135–145)

## 2021-09-19 LAB — BLOOD GAS, ARTERIAL
Acid-base deficit: 3.8 mmol/L — ABNORMAL HIGH (ref 0.0–2.0)
Bicarbonate: 18.2 mmol/L — ABNORMAL LOW (ref 20.0–28.0)
O2 Saturation: 91 %
Patient temperature: 36.9
pCO2 arterial: 25 mmHg — ABNORMAL LOW (ref 32–48)
pH, Arterial: 7.47 — ABNORMAL HIGH (ref 7.35–7.45)
pO2, Arterial: 57 mmHg — ABNORMAL LOW (ref 83–108)

## 2021-09-19 LAB — CBC
HCT: 28.6 % — ABNORMAL LOW (ref 39.0–52.0)
Hemoglobin: 9.4 g/dL — ABNORMAL LOW (ref 13.0–17.0)
MCH: 30.1 pg (ref 26.0–34.0)
MCHC: 32.9 g/dL (ref 30.0–36.0)
MCV: 91.7 fL (ref 80.0–100.0)
Platelets: 353 10*3/uL (ref 150–400)
RBC: 3.12 MIL/uL — ABNORMAL LOW (ref 4.22–5.81)
RDW: 18.9 % — ABNORMAL HIGH (ref 11.5–15.5)
WBC: 20.6 10*3/uL — ABNORMAL HIGH (ref 4.0–10.5)
nRBC: 0.1 % (ref 0.0–0.2)

## 2021-09-19 LAB — VANCOMYCIN, TROUGH: Vancomycin Tr: 5 ug/mL — ABNORMAL LOW (ref 15–20)

## 2021-09-19 MED ORDER — POTASSIUM CHLORIDE 10 MEQ/100ML IV SOLN
10.0000 meq | INTRAVENOUS | Status: DC
  Administered 2021-09-19 (×2): 10 meq via INTRAVENOUS
  Filled 2021-09-19 (×2): qty 100

## 2021-09-19 MED ORDER — FUROSEMIDE 10 MG/ML IJ SOLN
60.0000 mg | Freq: Once | INTRAMUSCULAR | Status: AC
  Administered 2021-09-19: 60 mg via INTRAVENOUS
  Filled 2021-09-19: qty 6

## 2021-09-19 MED ORDER — LIDOCAINE HCL 1 % IJ SOLN
INTRAMUSCULAR | Status: AC
Start: 2021-09-19 — End: 2021-09-19
  Administered 2021-09-19: 10 mL
  Filled 2021-09-19: qty 20

## 2021-09-19 MED ORDER — POTASSIUM CHLORIDE CRYS ER 20 MEQ PO TBCR
40.0000 meq | EXTENDED_RELEASE_TABLET | ORAL | Status: AC
  Administered 2021-09-19 (×2): 40 meq via ORAL
  Filled 2021-09-19 (×2): qty 2

## 2021-09-19 MED ORDER — FUROSEMIDE 10 MG/ML IJ SOLN
40.0000 mg | Freq: Once | INTRAMUSCULAR | Status: AC
  Administered 2021-09-19: 40 mg via INTRAVENOUS
  Filled 2021-09-19: qty 4

## 2021-09-19 NOTE — Progress Notes (Addendum)
?Progress Note ? ? ?Patient: Eric Beltran. ZHG:[id number] DOB: November 20, 1940 DOA: 09/16/2021     3 ?DOS: the patient was seen and examined on 09/19/2021 ?  ?Brief hospital course: ?Eric Beltran was admitted to the hospital with the working diagnosis of staphylococcus bacteremia, cathter related blood stream infection, complicated with sepsis, pneumonia and acute hypoxemic respiratory failure.  ? ?81 yo male with the past medical history of coronary artery disease, atrial fibrillation, and sarcoma presented with cough, fatigue and dyspnea. Reported 3 days of persistent symptoms, that prompted him to come to the hospital. On his initial physical examination his blood pressure was 132/69. HR 91. RR 34. 02 saturation 91% and temp 38 C. Lungs with bilateral rales, with no wheezing, heart with S1 and S2 present and rhythmic, abdomen not distended and pitting edema at the left lower extremity.   ? ?Na 134, K 3,6 cl 104, bicarbonate 18, glucose 136, bun 12 cr 1,0 ?Mg 1,9  ?Venous pH 7,54 with Pco2 22,1  ?High sensitive troponin 44 and 45  ?Wbc 25,7 hgb 10,2 plt 217  ?Sars covid 19 negative  ? ?Chest radiograph with right upper lobe and left lower lobe interstitial infiltrate. Port in place on right IJ vein.  ? ?EKG 101 bpm, with normal axis, right bundle branch block, sinus rhythm with q wave lead III and AvF, with no significant ST segment or T wave changes.  ? ?Blood cultures positive for staphylococcus epidermidis 3/4 bottles resistant to methicillin.  ?Suspected Port a Cath infection, consulted ID with recommendations for port removal.  ?IR has been consulted. ? ?05/09 patient with worsening respiratory failure, chest film consistent with worsening infiltrates. He required Bipap for short period of time and was treated with furosemide.  ? ?Echocardiogram with preserved LV systolic function.  ? ? ?Assessment and Plan: ?Catheter-related bloodstream infection ?Peripheral blood cultures positive for staph epidermidis 3/4 bottles,  resistant to methicillin.  ?Complicated with severe sepsis, present on admission.  ? ?Patient has been afebrile for the last 14 hours. Wbc is down to 20.6  ? ?Follow up blood cultures with no growth.  ? ?Acute  Hypoxemic respiratory failure, bilateral pulmonary infiltrates, pneumonia associated with bacteremia, possible early ARDS, acute cardiogenic pulmonary edema (acue on chronic diastolic heart failure).   ? ?Echocardiogram with preserved LV systolic function, EF 60 to 65%, with severe left ventricular hypertrophy, preserved RV systolic function, RVSP 19.6, moderate left atrial enlargement.  ? ?Patient with increased 02 requirements, last night required non invasive mechanical ventilation with Bipap. Had furosemide with some improvement, this am is on high flow nasal cannula at 15 L/min with 02 saturation 94%.  ? ?Chest film personally reviewed noted worsening bilateral interstitial infiltrates bilateral upper lobes and lower lobes  with cephalization of the vasculature.  ? ?Urine output documented 550 ml.  ? ?Plan to add furosemide 60 mg today and monitor response.  ?Continue antibiotic therapy, if worsening respiratory failure may need higher level of care.  ? ?Paroxysmal atrial fibrillation (Eric Beltran) ?Continue rate control with diltiazem and amiodarone.  ?At this point doubt amiodarone induced acute lung injury but will have a close follow up if patient does not respond to diuresis. ? ?Anticoagulation with apixaban.  ? ?Coronary artery disease involving coronary bypass graft of native heart with angina pectoris (Eric Beltran) ?No chest pain, continue blood pressure control and antiplatelet therapy with clopidogrel.  ? ?Essential hypertension ?Continue to hold on antihypertensive agents. ?Diuresis with furosemide as tolerated for acute pulmonary edema.  ?Blood pressure  systolic 695 to 072 mmHg.  ? ? ?Sarcoma (Eric Beltran) ?Sp right AKA. Positive pulmonary metastasis.  ?Undergoing chemotherapy as outpatient with [facility], with Dr. Lendon Beltran.  ? ?I spoke with cancer center [facility] in [place], and informed about patient with bacteremia and plan to remove Port.  ? ?Cancer center will follow up after discharge.  ? ? ? ? ?Hypokalemia ?Hyponatremia and hypokalemia ? ?Renal function with serum cr at 0,81, K is 3,3 and serum bicarbonate a 19, with Na 134. ? ?Plan to continue K correction with oral Kcl, today will get 40 meq KCl x2 and one dose of 10 meq KCL lIV. ?Continue diuresis for pulmonary edema and follow up renal function and electrolytes in am,   ? ? ? ? ? ?  ? ?Subjective: Patient is feeling better this am but last  night had significant dyspnea that required bipap and IV furosemide. No chest pain. His son is at the bedside  ? ?Physical Exam: ?Vitals:  ? 09/19/21 0500 09/19/21 0600 09/19/21 0700 09/19/21 0833  ?BP: 109/68 125/69 122/67   ?Pulse: 90 84 77   ?Resp: (!) 26 (!) 23 (!) 25   ?Temp: 98 ?F (36.7 ?C) (!) 97.4 ?F (36.3 ?C) (!) 97.4 ?F (36.3 ?C)   ?TempSrc: Oral Axillary Axillary   ?SpO2: 99% 99% 98% 94%  ?Weight:      ?Height:      ? ?Neurology awake and alert ?ENT with mild pallor ?Cardiovascular with S1 and S2 present with positive murmur at the apex with no gallops.  ?Respiratory with bilateral rales with no wheezing or rhonchi  ?Abdomen not distended ?Left lower extremity with ++ pitting edema ?Right AKA.  ? ?Data Reviewed: ? ? ? ?Family Communication: I spoke with patient's son at the bedside, we talked in detail about patient's condition, plan of care and prognosis and all questions were addressed. ? ? ?Disposition: ?Status is: Inpatient ?Remains inpatient appropriate because: respiratory failure  ? Planned Discharge Destination: Home ? ? ? ?Author: ?Tawni Millers, MD ?09/19/2021 9:39 AM ? ?For on call review [email or web address].  ?

## 2021-09-19 NOTE — Progress Notes (Signed)
Patient has been tolerating [place] HFNC fairly well this evening.  Patient sat right now is 93% on 11L.  His sat dropped into upper 80s and raised to 11L.  Bipap is still at bedside if needed.  RR is 22 and HR 85.  Will continue to monitor. ?

## 2021-09-19 NOTE — Significant Event (Signed)
Rapid Response Event Note  ? ?Reason for Call : Tachycardia and tachypnea ?Initial Focused Assessment:  ?Nursing staff notified me of pt with HR 130s and decrease in oxygen saturations to 85%. Pt is now on NRB mask at 15L and sats are 99%. Pt is alert, oriented x4. Denies CP and SOB. BBS rales in the bases. Denies productive cough. Pt has no increased WOB or distress. Skin is warm pink and dry. Cap refill WNL.  ?0242- 100.44F, HR 132 afib, 150/89 (106), RR 26 with sats 97% on NRB mask at 15L.  ?Pt is being placed on HHFNC.  ? ?Interventions:  ?-PCXR per orders ?-HHFNC per RT.  ? ?Plan of Care:  ?-Notify MD for further orders ?-Wean O2 as tolerated ?-Notify MD and/or RRRN for further assistance.  ? ? ? ? ?MD Notified: Dr. Cyd Silence per primary RN ?Call [id number] ?Arrival Time: 12 ?End Time: 9470 ? ?Madelynn Done, RN ?

## 2021-09-19 NOTE — Progress Notes (Signed)
0200 Rounding on patient and noticed heart rate up in 130's and oxygen saturations down 83-85% on high flow 14 liters. Patient denies chest pain at present time .Placed patient on non rebreather 100% to get saturations up 93-97%. 12 lead EKG obtained. Respiratory therapy and rapid response called to evaluate patient. Text paged Dr. Cyd Silence. ?

## 2021-09-19 NOTE — Progress Notes (Signed)
Pts FI02 decreased to 50% at this time.  Dayshift RT will continue to wean as tolerated. ?

## 2021-09-19 NOTE — Progress Notes (Signed)
HOSPITAL MEDICINE OVERNIGHT EVENT NOTE   ? ?Notified by nursing that patient began to develop worsening shortness of breath at approximately 3 AM.  This was associated with sudden onset tachycardia with heart rates between 130 and 140 bpm.  Wide-complex likely due to underlying known right bundle branch block. ? ?Patient was chest pain-free but continued to be in respiratory distress.  Stat chest x-ray was obtained revealing remarkably worsening bilateral opacities concerning for rapidly progressive pulmonary edema or possibly even ARDS.  Respiratory had transition the patient to a heated high flow mask after which an ABG was performed revealing a PaO2 of 57 concerning for severe hypoxic respiratory failure. ? ?Advised respiratory to place the patient on BiPAP and administered 40 mg of intravenous Lasix. ? ?Vernelle Emerald  MD ?[facility]  ? ?ADDENDUM 5/9 6:20am ? ?Patient's symptoms have remarkably improved over the past several hours.  Patient is converted from his ongoing tachycardia back to normal sinus rhythm with a controlled rate.  Patient states that his shortness of breath has dramatically improved and continues to deny chest pain. ? ?Patient remains on BiPAP therapy and has urinated 500 cc since administration of 40 mg of Lasix. ? ?I went to go evaluate the patient at the bedside who is now breathing comfortably on BiPAP therapy.  Lungs reveal faint bilateral lower lobe and mid field rales but otherwise are unremarkable. ? ?We will keep patient on BiPAP for now as we continue to diurese the patient.  If patient fails to rapidly improve or to clinically deteriorates further PCCM will need to be consulted. ? ?Sherryll Burger Janisse Ghan ? ? ? ? ? ? ? ? ? ?

## 2021-09-19 NOTE — Plan of Care (Signed)
  Problem: Activity: Goal: Ability to tolerate increased activity will improve Outcome: Progressing   Problem: Clinical Measurements: Goal: Ability to maintain a body temperature in the normal range will improve Outcome: Progressing   Problem: Respiratory: Goal: Ability to maintain adequate ventilation will improve Outcome: Progressing   Problem: Respiratory: Goal: Ability to maintain a clear airway will improve Outcome: Progressing   

## 2021-09-19 NOTE — Progress Notes (Signed)
Pt placed on BIPAP at this time due to increased resp rate and oxygen saturation.  Tolerating well at this time.  I will continue to monitor and titrate oxygen as tolerated. ?

## 2021-09-19 NOTE — Procedures (Signed)
Interventional Radiology Procedure Note ? ? ?History: Patient referred for port removal in setting of bacteremia.  ? ?Procedure: Removal of a right IJ approach single lumen PowerPort.  ? ?Specimen: tip culture  ? ?Complications: None ? ?Recommendations:  ?- routine wound care ?- Do not submerge for 7 days ?  ? ?Signed, ? ?Dulcy Fanny. Earleen Newport, DO ? ? ?

## 2021-09-19 NOTE — Progress Notes (Signed)
Pt placed on heated high flow nasal cannula due to desaturation.  Patient tolerating well at this time 100% and a rate of 45 ?

## 2021-09-19 NOTE — Progress Notes (Signed)
?   09/19/21 0200  ?Assess: MEWS Score  ?Temp 100.1 ?F (37.8 ?C)  ?BP (!) 146/84  ?Pulse Rate (!) 127  ?ECG Heart Rate (!) 128  ?Resp (!) 28  ?SpO2 93 %  ?O2 Device Non-rebreather Mask  ?Patient Activity (if Appropriate) In bed  ?Assess: MEWS Score  ?MEWS Temp 0  ?MEWS Systolic 0  ?MEWS Pulse 2  ?MEWS RR 2  ?MEWS LOC 0  ?MEWS Score 4  ?MEWS Score Color Red  ?Assess: if the MEWS score is Yellow or Red  ?Were vital signs taken at a resting state? Yes  ?Focused Assessment Change from prior assessment (see assessment flowsheet)  ?Early Detection of Sepsis Score *See Row Information* High  ?MEWS guidelines implemented *See Row Information* Yes  ?Treat  ?Pain Scale 0-10  ?Pain Score 0  ?Take Vital Signs  ?Increase Vital Sign Frequency  Red: Q 1hr X 4 then Q 4hr X 4, if remains red, continue Q 4hrs  ?Escalate  ?MEWS: Escalate Red: discuss with charge nurse/RN and provider, consider discussing with RRT  ?Notify: Charge Nurse/RN  ?Name of Charge Nurse/RN Notified Santiago Glad , RN  ?Date Charge Nurse/RN Notified 09/19/21  ?Time Charge Nurse/RN Notified 0230  ?Notify: Provider  ?Provider Name/Title Dr. Inda Merlin  ?Date Provider Notified 09/19/21  ?Time Provider Notified 0230  ?Method of Notification Call  ?Notification Reason Change in status  ?Provider response Evaluate remotely;See new orders  ?Date of Provider Response 09/19/21  ?Time of Provider Response 0230  ?Notify: Rapid Response  ?Name of Rapid Response RN Notified Shanon Brow  ?Date Rapid Response Notified 09/19/21  ?Time Rapid Response Notified 6314  ?Document  ?Patient Outcome Stabilized after interventions  ?Progress note created (see row info) Yes  ? ? ?

## 2021-09-20 DIAGNOSIS — T80211D Bloodstream infection due to central venous catheter, subsequent encounter: Secondary | ICD-10-CM | POA: Diagnosis not present

## 2021-09-20 DIAGNOSIS — J189 Pneumonia, unspecified organism: Secondary | ICD-10-CM | POA: Diagnosis not present

## 2021-09-20 DIAGNOSIS — I25709 Atherosclerosis of coronary artery bypass graft(s), unspecified, with unspecified angina pectoris: Secondary | ICD-10-CM | POA: Diagnosis not present

## 2021-09-20 DIAGNOSIS — T80211A Bloodstream infection due to central venous catheter, initial encounter: Secondary | ICD-10-CM | POA: Diagnosis not present

## 2021-09-20 DIAGNOSIS — J9621 Acute and chronic respiratory failure with hypoxia: Secondary | ICD-10-CM | POA: Diagnosis not present

## 2021-09-20 LAB — BASIC METABOLIC PANEL
Anion gap: 6 (ref 5–15)
BUN: 16 mg/dL (ref 8–23)
CO2: 20 mmol/L — ABNORMAL LOW (ref 22–32)
Calcium: 7.7 mg/dL — ABNORMAL LOW (ref 8.9–10.3)
Chloride: 107 mmol/L (ref 98–111)
Creatinine, Ser: 0.71 mg/dL (ref 0.61–1.24)
GFR, Estimated: >60 mL/min (ref >60)
Glucose, Bld: 120 mg/dL — ABNORMAL HIGH (ref 70–99)
Potassium: 3.5 mmol/L (ref 3.5–5.1)
Sodium: 133 mmol/L — ABNORMAL LOW (ref 135–145)

## 2021-09-20 LAB — CBC WITH DIFFERENTIAL/PLATELET
Abs Immature Granulocytes: 0 10*3/uL (ref 0.00–0.07)
Band Neutrophils: 1 %
Basophils Absolute: 0.2 10*3/uL — ABNORMAL HIGH (ref 0.0–0.1)
Basophils Relative: 1 %
Eosinophils Absolute: 0 10*3/uL (ref 0.0–0.5)
Eosinophils Relative: 0 %
HCT: 27.7 % — ABNORMAL LOW (ref 39.0–52.0)
Hemoglobin: 9 g/dL — ABNORMAL LOW (ref 13.0–17.0)
Lymphocytes Relative: 1 %
Lymphs Abs: 0.2 10*3/uL — ABNORMAL LOW (ref 0.7–4.0)
MCH: 29.9 pg (ref 26.0–34.0)
MCHC: 32.5 g/dL (ref 30.0–36.0)
MCV: 92 fL (ref 80.0–100.0)
Monocytes Absolute: 1.2 10*3/uL — ABNORMAL HIGH (ref 0.1–1.0)
Monocytes Relative: 6 %
Neutro Abs: 18.6 10*3/uL — ABNORMAL HIGH (ref 1.7–7.7)
Neutrophils Relative %: 91 %
Platelets: 405 10*3/uL — ABNORMAL HIGH (ref 150–400)
RBC: 3.01 MIL/uL — ABNORMAL LOW (ref 4.22–5.81)
RDW: 19 % — ABNORMAL HIGH (ref 11.5–15.5)
WBC: 20.2 10*3/uL — ABNORMAL HIGH (ref 4.0–10.5)
nRBC: 0 % (ref 0.0–0.2)

## 2021-09-20 LAB — CULTURE, BLOOD (ROUTINE X 2)
Special Requests: ADEQUATE
Special Requests: ADEQUATE

## 2021-09-20 LAB — VANCOMYCIN, RANDOM: Vancomycin Rm: 16

## 2021-09-20 MED ORDER — VANCOMYCIN HCL IN DEXTROSE 1-5 GM/200ML-% IV SOLN
1000.0000 mg | Freq: Two times a day (BID) | INTRAVENOUS | Status: AC
  Administered 2021-09-20 – 2021-09-24 (×9): 1000 mg via INTRAVENOUS
  Filled 2021-09-20 (×9): qty 200

## 2021-09-20 MED ORDER — POTASSIUM CHLORIDE CRYS ER 20 MEQ PO TBCR
40.0000 meq | EXTENDED_RELEASE_TABLET | Freq: Once | ORAL | Status: AC
  Administered 2021-09-20: 40 meq via ORAL
  Filled 2021-09-20: qty 2

## 2021-09-20 MED ORDER — FUROSEMIDE 10 MG/ML IJ SOLN
40.0000 mg | Freq: Two times a day (BID) | INTRAMUSCULAR | Status: DC
  Administered 2021-09-20 – 2021-09-21 (×4): 40 mg via INTRAVENOUS
  Filled 2021-09-20 (×4): qty 4

## 2021-09-20 MED ORDER — VANCOMYCIN HCL 1250 MG/250ML IV SOLN
1250.0000 mg | Freq: Once | INTRAVENOUS | Status: AC
  Administered 2021-09-20: 1250 mg via INTRAVENOUS
  Filled 2021-09-20: qty 250

## 2021-09-20 NOTE — Progress Notes (Signed)
?PROGRESS NOTE ? ?Eric Beltran. UMP:[id number] DOB: 1940/11/28 DOA: 09/16/2021 ?PCP: Celene Squibb, MD ? ? LOS: 4 days  ? 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  Patient tells me he has been short of breath for the past 1 to 2 weeks but in the last 3 days prior to arrival he was barely able to walk 10 to 15 feet without getting out of breath.  He was found to have Staphylococcus bacteremia, and chemotherapy port was removed on 5/9.  ID consulted as well ? ?Subjective / 24h Interval events: ?Complains of shortness of breath.  Had to be placed on heated high flow overnight, currently 25 L 70% FiO2.  Appears more comfortable on the settings ? ?Assesement and Plan: ?Active Problems: ?  Catheter-related bloodstream infection ?  Paroxysmal atrial fibrillation (HCC) ?  Coronary artery disease involving coronary bypass graft of native heart with angina pectoris ([place]) ?  Essential hypertension ?  Sarcoma ([place]) ?  Hypokalemia ? ?Principal problem ?Sepsis due to Staph hominis and epidermidis bacteremia-met sepsis criteria on admission with fever of 101.4, leukocytosis, tachycardia and the source.  ID consulted and following, appreciate input.  Currently he is on vancomycin.  This was felt to be catheter related bloodstream infection, IR consulted and he is status post port removal 5/9.  Dr. Cathlean Sauer discussed with the patient's primary oncology team at [facility] regarding catheter removal. ? ?Active problems ?Acute hypoxic respiratory failure-chest x-ray with bilateral pulmonary infiltrates, query pneumonia associated with bacteremia versus early ARDS in the setting of sepsis.  Urine output not measured accurately, but appears to be net positive, continue Lasix ? ?Acute on chronic diastolic QMG-8Q echo done 5/8  showed EF 65 to 65%, no WMA.  He has grade 1 diastolic dysfunction.  RV was normal.  Possibly degree of fluid overload on the chest x-ray above.  Continue Lasix. ? ?Paroxysmal atrial fibrillation (HCC) -Continue rate control with diltiazem and amiodarone.  Continue Eliquis. At this point doubt amiodarone induced acute lung injury but will have a close follow up if patient does not respond to diuresis. ?  ?Coronary artery disease involving coronary bypass graft of native heart with angina pectoris (HCC) -No chest pain, continue blood pressure control and antiplatelet therapy with clopidogrel.  ?  ?Essential hypertension -Diuresis with furosemide as tolerated for acute pulmonary edema.  Blood pressure stable today.  ?  ?Sarcoma ([place]) -Sp right AKA. Positive pulmonary metastasis. Undergoing chemotherapy as outpatient with [facility], with Dr. Lendon Collar.  Dr. Cathlean Sauer discussed with cancer center in [place] ? ?Hypokalemia -replete as indicated ?  ?Hyponatremia-mild, continue to monitor while on diuresis ?  ?Scheduled Meds: ? amiodarone  100 mg Oral Daily  ? apixaban  2.5 mg Oral BID  ? atorvastatin  80 mg Oral QHS  ? clopidogrel  75 mg Oral Q breakfast  ? diltiazem  240 mg Oral Daily  ? furosemide  40 mg Intravenous BID  ? pantoprazole  40 mg Oral Daily  ? sodium chloride flush  3 mL Intravenous Q12H  ? tamsulosin  0.4 mg Oral QHS  ? ?Continuous Infusions: ? sodium chloride    ? vancomycin    ? vancomycin    ? ?PRN Meds:.acetaminophen **OR** acetaminophen, albuterol, lip balm, nitroGLYCERIN, ondansetron **OR** ondansetron (ZOFRAN) IV, oxyCODONE, senna-docusate, sodium chloride ? ?Diet Orders (From  admission, onward)  ? ?  Start     Ordered  ? 09/17/21 0003  Diet Heart Room service appropriate? Yes; Fluid consistency: Thin  Diet effective now       ?Question Answer Comment  ?Room service appropriate? Yes   ?Fluid consistency: Thin   ?  ? 09/17/21 0003  ? ?  ?  ? ?  ? ? ?DVT prophylaxis: apixaban (ELIQUIS) tablet 2.5 mg Start:  09/17/21 0015 ?apixaban (ELIQUIS) tablet 2.5 mg  ? ?Lab Results  ?Component Value Date  ? PLT 405 (H) 09/20/2021  ? ? ?  Code Status: Full Code ? ?Family Communication: no family at bedside  ? ?Status is: Inpatient ? ?Remains inpatient appropriate because: Severity of illness ? ?Level of care: Progressive ? ?Consultants:  ?ID ?IR ? ?Procedures:  ?2d echo ? ?Objective: ?Vitals:  ? 09/20/21 0435 09/20/21 0709 09/20/21 0808 09/20/21 0900  ?BP: (!) 145/65 119/65  (!) 143/65  ?Pulse: 86 79    ?Resp: (!) 21 20    ?Temp: 99.2 ?F (37.3 ?C) 98.3 ?F (36.8 ?C)    ?TempSrc: Oral Oral    ?SpO2: 90% 90% 94%   ?Weight:      ?Height:      ? ? ?Intake/Output Summary (Last 24 hours) at 09/20/2021 0933 ?Last data filed at 09/20/2021 0149 ?Gross per 24 hour  ?Intake 480 ml  ?Output 950 ml  ?Net -470 ml  ? ?Wt Readings from Last 3 Encounters:  ?09/20/21 75.6 kg  ?09/14/21 82.6 kg  ?09/12/21 82.6 kg  ? ? ?Examination: ? ?Constitutional: NAD ?Eyes: no scleral icterus ?ENMT: Mucous membranes are moist.  ?Neck: normal, supple ?Respiratory: Diffuse bibasilar rhonchi, increased respiratory effort, tachypneic at times ?Cardiovascular: Regular rate and rhythm, no murmurs / rubs / gallops.  ?Abdomen: non distended, no tenderness. Bowel sounds positive.  ?Musculoskeletal: no clubbing / cyanosis.  ?Skin: no rashes ?Neurologic: non focal  ? ? ?Data Reviewed: I have independently reviewed following labs and imaging studies  ? ?CBC ?Recent Labs  ?Lab 09/14/21 ?2000 09/16/21 ?2013 09/16/21 ?2033 09/17/21 ?0234 09/18/21 ?6213 09/19/21 ?0310 09/20/21 ?0403  ?WBC 30.7* 25.7*  --  22.7* 27.7* 20.6* 20.2*  ?HGB 9.9* 10.2* 10.5* 9.0* 9.9* 9.4* 9.0*  ?HCT 31.0* 32.0* 31.0* 27.6* 31.6* 28.6* 27.7*  ?PLT 93* 217  --  196 321 353 405*  ?MCV 94.5 95.2  --  93.9 95.5 91.7 92.0  ?MCH 30.2 30.4  --  30.6 29.9 30.1 29.9  ?MCHC 31.9 31.9  --  32.6 31.3 32.9 32.5  ?RDW 20.0* 19.1*  --  19.3* 19.1* 18.9* 19.0*  ?LYMPHSABS 3.1  --   --   --   --   --  0.2*  ?MONOABS 4.6*   --   --   --   --   --  1.2*  ?EOSABS 0.0  --   --   --   --   --  0.0  ?BASOSABS 0.6*  --   --   --   --   --  0.2*  ? ? ?Recent Labs  ?Lab 09/14/21 ?2000 09/16/21 ?2013 09/16/21 ?2014 09/16/21 ?2033 09/16/21 ?2234 09/17/21 ?0234 09/18/21 ?0865 09/19/21 ?0310 09/20/21 ?0403  ?NA 130* 134*  --  134*  --  136 136 134* 133*  ?K 3.6 3.6  --  3.6  --  3.1* 3.3* 3.3* 3.5  ?CL 101 104  --   --   --  108 107 106 107  ?CO2 20* 18*  --   --   --  20* 20* 19* 20*  ?GLUCOSE 136* 136*  --   --   --  151* 120* 136* 120*  ?BUN 15 12  --   --   --  _0 ?CREATININE 0.84 1.01  --   --   --  1.01 0.78 0.81 0.71  ?CALCIUM 7.7* 8.0*  --   --   --  7.5* 8.1* 7.7* 7.7*  ?AST 24  --   --   --   --   --   --   --   --   ?ALT 28  --   --   --   --   --   --   --   --   ?ALKPHOS 144*  --   --   --   --   --   --   --   --   ?BILITOT 0.8  --   --   --   --   --   --   --   --   ?ALBUMIN 3.2*  --   --   --   --   --   --   --   --   ?MG  --  1.9  --   --   --   --  2.0  --   --   ?DDIMER 0.86*  --   --   --   --   --   --   --   --   ?PROCALCITON  --   --   --   --  0.21 0.31  --   --   --   ?LATICACIDVEN  --   --  1.7  --   --   --   --   --   --   ?BNP  --  280.3*  --   --   --   --   --   --   --   ? ? ?------------------------------------------------------------------------------------------------------------------ ?No results for input(s): CHOL, HDL, LDLCALC, TRIG, CHOLHDL, LDLDIRECT in the last 72 hours. ? ?Lab Results  ?Component Value Date  ? HGBA1C 7.1 (H) 07/26/2021  ? ?------------------------------------------------------------------------------------------------------------------ ?No results for input(s): TSH, T4TOTAL, T3FREE, THYROIDAB in the last 72 hours. ? ?Invalid input(s): FREET3 ? ?Cardiac Enzymes ?No results for input(s): CKMB, TROPONINI, MYOGLOBIN in the last 168 hours. ? ?Invalid input(s): CK ?------------------------------------------------------------------------------------------------------------------ ?    ?Component Value Date/Time  ? BNP 280.3 (H) 09/16/2021 2013  ? ? ?CBG: ?No results for input(s): GLUCAP in the last 168 hours. ? ?Recent Results (from the past 240 hour(s))  ?Blood culture (routine x 2)     St

## 2021-09-20 NOTE — Progress Notes (Signed)
Received call from patient's RN stating that patient was desating on [place] HFNC at [place].  Asked RN to turn it up and she did but sats were in mid to upper 26s.  Patient did not want to go on Bipap so placed patient on [place].  Patient is currently on [place] at 70% FIO2 and sat is 95%.  I have placed a mask on patient's face because he is a mouth breather, but no O2 is running to mask. ?

## 2021-09-20 NOTE — Progress Notes (Addendum)
Pharmacy Antibiotic Note ? ?Eric Beltran. is a 81 y.o. male admitted on 09/16/2021 with MRSE Bacteremia. Pharmacy has been consulted for vancomycin dosing. ? ?AUC per levels is low at 270 (goal 400-600). WBC 20 stable. Afebrile. TTE 5/8 with no vegetations seen. Port removed 5/9. ? ?5/10 Vancomycin 1000 mg Q 24 hr est AUC: 433 ? ?Plan: ?Increase vancomycin to '1250mg'$  x1 then '1000mg'$  Q12 hr   ?F/u TEE, ID recs, LOT ?F/u Q Monday vancomycin levels or if renal function changes  ? ?Height: '5\' 7"'$  (170.2 cm) ?Weight: 75.6 kg (166 lb 10.7 oz) ?IBW/kg (Calculated) : 66.1 ? ?Temp (24hrs), Avg:98.7 ?F (37.1 ?C), Min:97.9 ?F (36.6 ?C), Max:99.4 ?F (37.4 ?C) ? ?Recent Labs  ?Lab 09/16/21 ?2013 09/16/21 ?2014 09/17/21 ?0234 09/18/21 ?0340 09/19/21 ?0310 09/19/21 ?2145 09/20/21 ?0403  ?WBC 25.7*  --  22.7* 27.7* 20.6*  --  20.2*  ?CREATININE 1.01  --  1.01 0.78 0.81  --  0.71  ?LATICACIDVEN  --  1.7  --   --   --   --   --   ?[place]  --   --   --   --   --  5*  --   ?VANCORANDOM  --   --   --   --   --   --  16  ? ?  ?Estimated Creatinine Clearance: 70 mL/min (by C-G formula based on SCr of 0.71 mg/dL).   ? ?No Active Allergies ? ? ?Antimicrobials this admission: ?Cefepime 5/6 x 1 ?Azithro 5/6 >>5/7 ?CTX 5/6>>5/8 ?Vanc 5/7 >>  ? ?Vancomycin levels: ?5/9 VT= 5/ VR = 16; AUC = 270 ? ?Microbiology results: ? 5/6 Bcx 4/4: Staph epi -mecA resist & staph hominis  ?5/7 Bcx: NGTD  ?5/9 Bcx: NGTD ? ?Thank you for allowing pharmacy to participate in this patient's care. ? ?Benetta Spar, PharmD, BCPS, BCCP ?Clinical Pharmacist ? ?Please check AMION for all [facility] phone numbers ?After 10:00 PM, call [place] [phone] ? ?

## 2021-09-20 NOTE — Progress Notes (Signed)
?   ? ? ? ? ?[facility] for Infectious Disease ? ?Date of Admission:  09/16/2021    ? ? ?Lines:  ?Right chest chronic chemo port removed 5/9 ?  ?Abx: ?5/6-c vanc ?  ?5/6-8 ceftriaxone                                                          ?5/07 azith ?  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  ?Agree bsi likely clabsi. Given cancer history at risk for IE and will need to check that out ?  ?Agree would need to remove the port for improved chance clearing the bacteremia. Certainly right sided endocarditis with pulm involvement is possible in terms of his pulm parenchymal opacity ?  ?Unclear what the cough was; getting better; cxr on 5/6 perihilar interstitial opacity and cardiomegaly ?fluid. Procal < 0.5 x2 ? ? ?----- ?5/10 assessment ?Worsening pulm bilateral opacity ?fluid ?No cough or ongoing fever; luekocytosis improving suggesting possibly more of a fluid component vs ards for his chest xray ? ?Catheter tip cx in progress ? ? ? ?-continue vancomycin ?-f/u staph epi susceptibility ?-if productive cough, fever, worsening leukocytosis will add atypical/gram negative coverage ?-fluid management per primary team ? ? ? ?I spent more than 50 minute reviewing data/chart, and coordinating care and >50% direct face to face time providing counseling/discussing diagnostics/treatment plan with patient ? ? ?Active Problems: ?  Essential hypertension ?  Coronary artery disease involving coronary bypass graft of native heart with angina pectoris ([place]) ?  Paroxysmal atrial fibrillation (HCC) ?  Catheter-related bloodstream infection ?  Sarcoma ([place]) ?  Hypokalemia ? ? ?No Active Allergies ? ?Scheduled Meds: ? amiodarone  100 mg Oral Daily  ?  apixaban  2.5 mg Oral BID  ? atorvastatin  80 mg Oral QHS  ? clopidogrel  75 mg Oral Q breakfast  ? diltiazem  240 mg Oral Daily  ? furosemide  40 mg Intravenous BID  ? pantoprazole  40 mg Oral Daily  ? sodium chloride flush  3 mL Intravenous Q12H  ? tamsulosin  0.4 mg Oral QHS  ? ?Continuous Infusions: ? sodium chloride    ? vancomycin 200 mL/hr at 09/20/21 2008  ? ?PRN Meds:.acetaminophen **OR** acetaminophen, albuterol, lip balm, nitroGLYCERIN, ondansetron **OR** ondansetron (ZOFRAN) IV, oxyCODONE, senna-docusate, sodium chloride ? ? ?SUBJECTIVE: ?Afebrile ?Imprving wbc ?Increased o2 requirement ?Cxr worsening pulm opacity ?No cough ? ?Review of Systems: ?ROS ?All other ROS was negative, except mentioned above ? ? ? ? ?OBJECTIVE: ?Vitals:  ? 09/20/21 1951 09/20/21 1952 09/20/21 2005 09/20/21 2102  ?BP:  132/70 125/72 132/71  ?Pulse: 87 86 85 84  ?Resp: (!) 34 '17 20 19  '$ ?Temp:  98.4 ?F (36.9 ?C) 98.5 ?F (36.9 ?C) 98.4 ?F (36.9 ?C)  ?TempSrc:   Oral Oral  ?SpO2: 95%  92% 90%  ?Weight:      ?Height:      ? ?Body mass index is 26.1 kg/m?. ? ?Physical Exam ?General/constitutional: no distress, pleasant; on 8 liters o2 via face mask; conversant ?HEENT: Normocephalic,  PER, Conj Clear, EOMI, Oropharynx clear ?Neck supple ?CV: rrr no mrg ?Lungs: clear to auscultation, normal respiratory effort ?Abd: Soft, Nontender ?Ext: no edema ?Skin: No Rash ?Neuro: nonfocal ?MSK: no peripheral joint swelling/tenderness/warmth; back spines nontender ? ? ?Central line presence: previous right chest port site no swelling/erythema/tenderness ? ? ?Lab Results ?Lab Results  ?Component Value Date  ? WBC 20.2 (H) 09/20/2021  ? HGB 9.0 (L) 09/20/2021  ? HCT 27.7 (L) 09/20/2021  ? MCV 92.0 09/20/2021  ? PLT 405 (H) 09/20/2021  ?  ?Lab Results  ?Component Value Date  ? CREATININE 0.71 09/20/2021  ? BUN 16 09/20/2021  ? NA 133 (L) 09/20/2021  ? K 3.5 09/20/2021  ? CL 107 09/20/2021  ? CO2 20 (L) 09/20/2021  ?  ?Lab Results  ?Component Value Date   ? ALT 28 09/14/2021  ? AST 24 09/14/2021  ? ALKPHOS 144 (H) 09/14/2021  ? BILITOT 0.8 09/14/2021  ?  ? ? ?Microbiology: ?Recent Results (from the past 240 hour(s))  ?Blood culture (routine x 2)     Status: Abnormal  ? Collection Time: 09/16/21  8:03 PM  ? Specimen: BLOOD RIGHT ARM  ?Result Value Ref Range Status  ? Specimen Description BLOOD RIGHT ARM  Final  ? Special Requests   Final  ?  BOTTLES DRAWN AEROBIC AND ANAEROBIC Blood Culture adequate volume  ? Culture  Setup Time   Final  ?  GRAM POSITIVE COCCI IN CLUSTERS ?IN BOTH AEROBIC AND ANAEROBIC BOTTLES ?CRITICAL VALUE NOTED.  VALUE IS CONSISTENT WITH PREVIOUSLY REPORTED AND CALLED VALUE. ?  ? Culture (A)  Final  ?  STAPHYLOCOCCUS HOMINIS ?STAPHYLOCOCCUS EPIDERMIDIS ?SUSCEPTIBILITIES PERFORMED ON PREVIOUS CULTURE WITHIN THE LAST 5 DAYS. ?Performed at [facility], [place] [address], [place], [place] [address] ?  ? Report Status 09/20/2021 FINAL  Final  ?Resp Panel by RT-PCR (Flu A&B, Covid)     Status: None  ? Collection Time: 09/16/21  8:04 PM  ? Specimen: Nasopharyngeal(NP) swabs in vial transport medium  ?Result Value Ref Range Status  ? SARS Coronavirus 2 by RT PCR NEGATIVE NEGATIVE Final  ?  Comment: (NOTE) ?SARS-CoV-2 target nucleic acids are NOT DETECTED. ? ?The SARS-CoV-2 RNA is generally detectable in upper respiratory ?specimens during the acute phase of infection. The lowest ?concentration of SARS-CoV-2 viral copies this assay can detect is ?138 copies/mL. A negative result does not preclude SARS-Cov-2 ?infection and should not be used as the sole basis for treatment or ?other patient management decisions. A negative result may occur with  ?improper specimen collection/handling, submission of specimen other ?than nasopharyngeal swab, presence of viral mutation(s) within the ?areas targeted by this assay, and inadequate number of viral ?copies(<138 copies/mL). A negative result must be combined with ?clinical observations, patient history, and  epidemiological ?information. The expected result is Negative. ? ?Fact Sheet for Patients:  ?EntrepreneurPulse.com.au ? ?Fact Sheet for Healthcare Providers:  ?IncredibleEmployment.be ? ?This test is no t yet approved or cleared by the [place] FDA and  ?has been authorized for detection and/or diagnosis of SARS-CoV-2 by ?FDA under an Emergency Use Authorization (EUA). This EUA will remain  ?in effect (meaning this test can be used) for the duration of the ?COVID-19 declaration under Section 564(b)(1) of the Act, 21 ?U.S.C.section 360bbb-3(b)(1), unless the authorization is terminated  ?or revoked sooner.  ? ? ?  ? Influenza A by PCR NEGATIVE NEGATIVE Final  ? Influenza B by PCR NEGATIVE NEGATIVE Final  ?  Comment: (NOTE) ?  The Xpert Xpress SARS-CoV-2/FLU/RSV plus assay is intended as an aid ?in the diagnosis of influenza from Nasopharyngeal swab specimens and ?should not be used as a sole basis for treatment. Nasal washings and ?aspirates are unacceptable for Xpert Xpress SARS-CoV-2/FLU/RSV ?testing. ? ?Fact Sheet for Patients: ?EntrepreneurPulse.com.au ? ?Fact Sheet for Healthcare Providers: ?IncredibleEmployment.be ? ?This test is not yet approved or cleared by the [place] FDA and ?has been authorized for detection and/or diagnosis of SARS-CoV-2 by ?FDA under an Emergency Use Authorization (EUA). This EUA will remain ?in effect (meaning this test can be used) for the duration of the ?COVID-19 declaration under Section 564(b)(1) of the Act, 21 U.S.C. ?section 360bbb-3(b)(1), unless the authorization is terminated or ?revoked. ? ?Performed at [facility], [place] [address], [place], [place] ?[address] ?  ?Blood culture (routine x 2)     Status: Abnormal  ? Collection Time: 09/16/21  8:08 PM  ? Specimen: BLOOD  ?Result Value Ref Range Status  ? Specimen Description BLOOD LEFT ANTECUBITAL  Final  ? Special Requests   Final  ?  BOTTLES  DRAWN AEROBIC AND ANAEROBIC Blood Culture adequate volume  ? Culture  Setup Time   Final  ?  GRAM POSITIVE COCCI IN CLUSTERS ?IN BOTH AEROBIC AND ANAEROBIC BOTTLES ?Organism ID to follow ?CRITICAL RESULT

## 2021-09-21 ENCOUNTER — Inpatient Hospital Stay (HOSPITAL_COMMUNITY): Payer: Medicare Other

## 2021-09-21 DIAGNOSIS — J9621 Acute and chronic respiratory failure with hypoxia: Secondary | ICD-10-CM

## 2021-09-21 DIAGNOSIS — J189 Pneumonia, unspecified organism: Secondary | ICD-10-CM | POA: Diagnosis not present

## 2021-09-21 DIAGNOSIS — C499 Malignant neoplasm of connective and soft tissue, unspecified: Secondary | ICD-10-CM | POA: Diagnosis not present

## 2021-09-21 DIAGNOSIS — S27309A Unspecified injury of lung, unspecified, initial encounter: Secondary | ICD-10-CM

## 2021-09-21 DIAGNOSIS — T80211D Bloodstream infection due to central venous catheter, subsequent encounter: Secondary | ICD-10-CM | POA: Diagnosis not present

## 2021-09-21 DIAGNOSIS — I25709 Atherosclerosis of coronary artery bypass graft(s), unspecified, with unspecified angina pectoris: Secondary | ICD-10-CM | POA: Diagnosis not present

## 2021-09-21 LAB — COMPREHENSIVE METABOLIC PANEL
ALT: 59 U/L — ABNORMAL HIGH (ref 0–44)
AST: 76 U/L — ABNORMAL HIGH (ref 15–41)
Albumin: 2.1 g/dL — ABNORMAL LOW (ref 3.5–5.0)
Alkaline Phosphatase: 91 U/L (ref 38–126)
Anion gap: 8 (ref 5–15)
BUN: 12 mg/dL (ref 8–23)
CO2: 20 mmol/L — ABNORMAL LOW (ref 22–32)
Calcium: 7.8 mg/dL — ABNORMAL LOW (ref 8.9–10.3)
Chloride: 106 mmol/L (ref 98–111)
Creatinine, Ser: 0.79 mg/dL (ref 0.61–1.24)
GFR, Estimated: >60 mL/min (ref >60)
Glucose, Bld: 143 mg/dL — ABNORMAL HIGH (ref 70–99)
Potassium: 3.9 mmol/L (ref 3.5–5.1)
Sodium: 134 mmol/L — ABNORMAL LOW (ref 135–145)
Total Bilirubin: 0.6 mg/dL (ref 0.3–1.2)
Total Protein: 5.1 g/dL — ABNORMAL LOW (ref 6.5–8.1)

## 2021-09-21 LAB — CBC
HCT: 28.3 % — ABNORMAL LOW (ref 39.0–52.0)
Hemoglobin: 9.3 g/dL — ABNORMAL LOW (ref 13.0–17.0)
MCH: 30.1 pg (ref 26.0–34.0)
MCHC: 32.9 g/dL (ref 30.0–36.0)
MCV: 91.6 fL (ref 80.0–100.0)
Platelets: 454 10*3/uL — ABNORMAL HIGH (ref 150–400)
RBC: 3.09 MIL/uL — ABNORMAL LOW (ref 4.22–5.81)
RDW: 18.7 % — ABNORMAL HIGH (ref 11.5–15.5)
WBC: 22.1 10*3/uL — ABNORMAL HIGH (ref 4.0–10.5)
nRBC: 0 % (ref 0.0–0.2)

## 2021-09-21 LAB — CATH TIP CULTURE: Culture: NO GROWTH

## 2021-09-21 LAB — PHOSPHORUS: Phosphorus: 2.5 mg/dL (ref 2.5–4.6)

## 2021-09-21 LAB — MAGNESIUM: Magnesium: 1.7 mg/dL (ref 1.7–2.4)

## 2021-09-21 LAB — TROPONIN I (HIGH SENSITIVITY): Troponin I (High Sensitivity): 16 ng/L (ref <18)

## 2021-09-21 MED ORDER — SODIUM CHLORIDE 0.9 % IV SOLN
2.0000 g | INTRAVENOUS | Status: DC
  Administered 2021-09-21: 2 g via INTRAVENOUS
  Filled 2021-09-21: qty 20

## 2021-09-21 MED ORDER — METHYLPREDNISOLONE SODIUM SUCC 125 MG IJ SOLR
80.0000 mg | Freq: Every day | INTRAMUSCULAR | Status: DC
  Administered 2021-09-21 – 2021-09-22 (×2): 80 mg via INTRAVENOUS
  Filled 2021-09-21 (×3): qty 2

## 2021-09-21 MED ORDER — DOXYCYCLINE HYCLATE 100 MG PO TABS
100.0000 mg | ORAL_TABLET | Freq: Two times a day (BID) | ORAL | Status: DC
Start: 2021-09-21 — End: 2021-09-21
  Administered 2021-09-21: 100 mg via ORAL
  Filled 2021-09-21: qty 1

## 2021-09-21 NOTE — Care Management Important Message (Signed)
Important Message ? ?Patient Details  ?Name: Eric Beltran. ?MRN: [id number] ?Date of Birth: [birth year] ? ? ?Medicare Important Message Given:  Yes ? ? ? ? ?Shelda Altes ?09/21/2021, 10:06 AM ?

## 2021-09-21 NOTE — Progress Notes (Signed)
?PROGRESS NOTE ? ?Eric Beltran. GGY:[id number] DOB: [birth year] DOA: 09/16/2021 ?PCP: Celene Squibb, MD ? ? LOS: 5 days  ? 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  Patient tells me he has been short of breath for the past 1 to 2 weeks but in the last 3 days prior to arrival he was barely able to walk 10 to 15 feet without getting out of breath.  He was found to have Staphylococcus bacteremia, and chemotherapy port was removed on 5/9.  ID consulted as well ? ?Subjective / 24h Interval events: ?He tells me that he is feeling better today.  Remains on heated high flow with 25 L / 60% FiO2.  No chest pain, no abdominal pain, no nausea or vomiting. ? ?Assesement and Plan: ?Active Problems: ?  Catheter-related bloodstream infection ?  Paroxysmal atrial fibrillation (HCC) ?  Coronary artery disease involving coronary bypass graft of native heart with angina pectoris ([place]) ?  Essential hypertension ?  Sarcoma ([place]) ?  Hypokalemia ? ?Principal problem ?Sepsis due to Staph hominis and epidermidis bacteremia-met sepsis criteria on admission with fever of 101.4, leukocytosis, tachycardia and the source.  ID consulted and following, appreciate input.  Currently he is on vancomycin.  This was felt to be catheter related bloodstream infection, IR consulted and he is status post port removal 5/9.  Dr. Cathlean Sauer discussed with the patient's primary oncology team at [facility] regarding catheter removal. ? ?Active problems ?Acute hypoxic respiratory failure-chest x-ray with bilateral pulmonary infiltrates, query pneumonia associated with bacteremia versus early ARDS in the setting of sepsis.  On outside records review, patient had a CT scan of the chest on 09/15/2021 which showed some patchy groundglass opacities, concerning for  fibrotic reaction to chemotherapy.  I have consulted pulmonology today, discussed with Dr. Shearon Stalls, appreciate input patient benefit from high-dose steroids.  Repeat CT scan of the chest is pending. ? ?Acute on chronic diastolic OJJ-0K echo done 5/8 showed EF 65 to 65%, no WMA.  He has grade 1 diastolic dysfunction.  RV was normal.  Possibly degree of fluid overload on the chest x-ray above.  Continue Lasix, tolerating well ? ?Paroxysmal atrial fibrillation (HCC) -Continue rate control with diltiazem and amiodarone.  Continue Eliquis. At this point doubt amiodarone induced acute lung injury but will have a close follow up if patient does not respond to diuresis. ?  ?Coronary artery disease involving coronary bypass graft of native heart with angina pectoris (HCC) -No chest pain, continue blood pressure control and antiplatelet therapy with clopidogrel.  ?  ?Essential hypertension -Diuresis with furosemide as tolerated for acute pulmonary edema.  Blood pressure remained stable today ?  ?Sarcoma ([place]) -Sp right AKA. Positive pulmonary metastasis. Undergoing chemotherapy as outpatient with [facility], with Dr. Lendon Collar.  Dr. Cathlean Sauer discussed with cancer center in [place] ? ?Hypokalemia -replete as indicated ?  ?Hyponatremia-mild, continue to monitor while on diuresis ?  ?Scheduled Meds: ? amiodarone  100 mg Oral Daily  ? apixaban  2.5 mg Oral BID  ? atorvastatin  80 mg Oral QHS  ? clopidogrel  75 mg Oral Q breakfast  ? diltiazem  240 mg Oral Daily  ? doxycycline  100 mg Oral Q12H  ? furosemide  40 mg Intravenous BID  ? pantoprazole  40 mg Oral Daily  ? sodium chloride flush  3 mL Intravenous Q12H  ? tamsulosin  0.4 mg Oral QHS  ? ?Continuous Infusions: ? sodium chloride    ? cefTRIAXone (ROCEPHIN)  IV    ? vancomycin 1,000 mg (09/21/21 3536)  ? ?PRN Meds:.acetaminophen **OR** acetaminophen, albuterol, lip balm, ondansetron **OR** ondansetron (ZOFRAN) IV, oxyCODONE, senna-docusate, sodium chloride ? ?Diet Orders (From admission,  onward)  ? ?  Start     Ordered  ? 09/17/21 0003  Diet Heart Room service appropriate? Yes; Fluid consistency: Thin  Diet effective now       ?Question Answer Comment  ?Room service appropriate? Yes   ?Fluid consistency: Thin   ?  ? 09/17/21 0003  ? ?  ?  ? ?  ? ? ?DVT prophylaxis: apixaban (ELIQUIS) tablet 2.5 mg Start: 09/17/21 0015 ?apixaban (ELIQUIS) tablet 2.5 mg  ? ?Lab Results  ?Component Value Date  ? PLT 454 (H) 09/21/2021  ? ? ?  Code Status: Full Code ? ?Family Communication: Son was present at bedside ? ?Status is: Inpatient ? ?Remains inpatient appropriate because: Severity of illness ? ?Level of care: Progressive ? ?Consultants:  ?ID ?IR ? ?Procedures:  ?2d echo ? ?Objective: ?Vitals:  ? 09/21/21 0221 09/21/21 0334 09/21/21 0416 09/21/21 0737  ?BP: 112/63  124/63 124/63  ?Pulse: 96  83   ?Resp: 20  18 (!) 76  ?Temp: 99.2 ?F (37.3 ?C)  99.7 ?F (37.6 ?C)   ?TempSrc: Oral  Oral   ?SpO2: 96%  94% 97%  ?Weight:  76 kg    ?Height:      ? ? ?Intake/Output Summary (Last 24 hours) at 09/21/2021 1100 ?Last data filed at 09/21/2021 1443 ?Gross per 24 hour  ?Intake 1069.26 ml  ?Output 2575 ml  ?Net -1505.74 ml  ? ? ?Wt Readings from Last 3 Encounters:  ?09/21/21 76 kg  ?09/14/21 82.6 kg  ?09/12/21 82.6 kg  ? ? ?Examination: ? ?Constitutional: NAD ?Eyes: lids and conjunctivae normal, no scleral icterus ?ENMT: mmm ?Neck: normal, supple ?Respiratory: Diffuse bibasilar rhonchi, no wheezing.  Overall lung exam improved ?Cardiovascular: Regular rate and rhythm, no murmurs / rubs / gallops.  Trace LE edema. ?Abdomen: soft, no distention, no tenderness. Bowel sounds positive.  ?Skin: no rashes ?Neurologic: no focal deficits, equal strength ? ? ?Data Reviewed: I have independently reviewed following labs and imaging studies  ? ?CBC ?Recent Labs  ?Lab 09/14/21 ?2000 09/16/21 ?2013 09/17/21 ?0234 09/18/21 ?1540 09/19/21 ?0310 09/20/21 ?0403 09/21/21 ?0867  ?WBC 30.7*   < > 22.7* 27.7* 20.6* 20.2* 22.1*  ?HGB 9.9*   < > 9.0*  9.9* 9.4* 9.0* 9.3*  ?HCT 31.0*   < > 27.6* 31.6* 28.6* 27.7* 28.3*  ?PLT 93*   < > 196 321 353 405* 454*  ?MCV 94.5   < > 93.9 95.5 91.7 92.0 91.6  ?MCH 30.2   < > 30.6 29.9 30.1 29.9 30.1  ?MCHC 31.9   < > 32.6 31.3 32.9 32.5 32.9  ?RDW 20.0*   < > 19.3* 19.1* 18.9* 19.0* 18.7*  ?LYMPHSABS 3.1  --   --   --   --  0.2*  --   ?MONOABS 4.6*  --   --   --   --  1.2*  --   ?EOSABS 0.0  --   --   --   --  0.0  --   ?BASOSABS 0.6*  --   --   --   --  0.2*  --   ? < > = values  in this interval not displayed.  ? ? ? ?Recent Labs  ?Lab 09/14/21 ?2000 09/16/21 ?2013 09/16/21 ?2014 09/16/21 ?2033 09/16/21 ?2234 09/17/21 ?0234 09/18/21 ?1443 09/19/21 ?0310 09/20/21 ?0403 09/21/21 ?1540  ?NA 130* 134*  --    < >  --  136 136 134* 133* 134*  ?K 3.6 3.6  --    < >  --  3.1* 3.3* 3.3* 3.5 3.9  ?CL 101 104  --   --   --  108 107 106 107 106  ?CO2 20* 18*  --   --   --  20* 20* 19* 20* 20*  ?GLUCOSE 136* 136*  --   --   --  151* 120* 136* 120* 143*  ?BUN 15 12  --   --   --  _0 ?CREATININE 0.84 1.01  --   --   --  1.01 0.78 0.81 0.71 0.79  ?CALCIUM 7.7* 8.0*  --   --   --  7.5* 8.1* 7.7* 7.7* 7.8*  ?AST 24  --   --   --   --   --   --   --   --  76*  ?ALT 28  --   --   --   --   --   --   --   --  59*  ?ALKPHOS 144*  --   --   --   --   --   --   --   --  91  ?BILITOT 0.8  --   --   --   --   --   --   --   --  0.6  ?ALBUMIN 3.2*  --   --   --   --   --   --   --   --  2.1*  ?MG  --  1.9  --   --   --   --  2.0  --   --  1.7  ?DDIMER 0.86*  --   --   --   --   --   --   --   --   --   ?PROCALCITON  --   --   --   --  0.21 0.31  --   --   --   --   ?LATICACIDVEN  --   --  1.7  --   --   --   --   --   --   --   ?BNP  --  280.3*  --   --   --   --   --   --   --   --   ? < > = values in this interval not displayed.  ? ? ? ?------------------------------------------------------------------------------------------------------------------ ?No results for input(s): CHOL, HDL, LDLCALC, TRIG, CHOLHDL, LDLDIRECT in the last 72  hours. ? ?Lab Results  ?Component Value Date  ? HGBA1C 7.1 (H) 07/26/2021  ? ?------------------------------------------------------------------------------------------------------------------ ?No resul

## 2021-09-21 NOTE — Consult Note (Signed)
? ?NAME:  Eric Beltran., MRN:  [id number], DOB:  1941-04-30, LOS: 5 ?ADMISSION DATE:  09/16/2021, CONSULTATION DATE:  09/21/2021 ?REFERRING MD:  Caren Griffins, MD, CHIEF COMPLAINT:  respiratory failure  ? ?History of Present Illness:  ?Eric Beltran. is a 81 y.o. 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 He has had progression of sarcoma with pulmonary metastasis in fall 2022 and currently undergoing gemcitabine docetaxel chemotherapy for this (s/p Cycle 5.)  He had restaging scans last month at [facility] on 09/17/2021 which showed reduction in many of the pulmonary nodules with interval thickening of the interstitial and patchy GGOs. He notes he has been feeling "bad" since 09/16/21 with fatigue and shortness of breath. His pulse oximetery over the weekend at home read 83% on room air which is why his brother encouraged him to come to the hospital. Admitted to [facility] service and was started empirically for CAP coverage. However over the last couple of days has had worsening oxygen requirement and chest xray. CT chest was obtained this morning and PCCM was consulted for further evaluation and possible chemotherapy induced pneumonitis.  ? ?This morning he says he feels the best he has felt in 3 days and denies dyspnea. Although he has not gotten out of bed since being here. While talking to him he is on 10L hi flow and satting between 93-98% depending on his respiratory effort. He denies coughing or wheezing.  ? ?Of note he has been on amiodarone for about 3-4 years. He denies ever being smoker but did have passive smoke exposure in both spouses. Sister also a smoker. He has worked in various jobs Economist, being a Psychologist, sport and exercise, Solicitor. Currently lives at home independently and no pets.  ? ?Pertinent  Medical History  ?Atrial fibrillation on eliquis and amiodarone ?CAD s/p CABG ?BPH ?HTN ? ?Significant Hospital Events: ?Including procedures,  antibiotic start and stop dates in addition to other pertinent events   ? ? ?Interim History / Subjective:  ? ? ?Objective   ?Blood pressure 124/63, pulse 83, temperature 99.7 ?F (37.6 ?C), temperature source Oral, resp. rate (!) 76, height '5\' 7"'$  (1.702 m), weight 76 kg, SpO2 97 %. ?   ?FiO2 (%):  [60 %-100 %] 60 %  ? ?Intake/Output Summary (Last 24 hours) at 09/21/2021 1024 ?Last data filed at 09/21/2021 4481 ?Gross per 24 hour  ?Intake 1069.26 ml  ?Output 2575 ml  ?Net -1505.74 ml  ? ?Filed Weights  ? 09/18/21 0403 09/20/21 0004 09/21/21 0334  ?Weight: 79.1 kg 75.6 kg 76 kg  ? ? ?Examination: ?General: elderly man, resting flat comfortably, no distress ?HENT: dry mucus membranes, on nasal cannula ?Lungs: symmetric bilateral air entry, no wheezes or crackles, no increased wob.  ?Cardiovascular: RRR no mrg ?Abdomen: soft, nontender ?Extremities: s/p R AKA ?Neuro: normal speech no focal asymmetry ? ?I have personally reviewed the CT Chest obtained 09/17/21 at [facility].  ?There are multiple pulmonary nodules consistent with metastatic disease. There is upper lobe predominant interstitial thickening and patchy ground glass opacities. There is some central and lower lobe bronchiectasis and peribronchial thickening which seems pre-existing.  ? ?Blood cultures 09/16/21 show 2/2 cultures positive for staph epidermidis ? ?Resolved Hospital Problem list   ? ? ?Assessment & Plan:  ? 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 ?CT Chest obtained 09/21/21 reviewed - shows progression of upperlobe predominant GGOs consistent with an acute hypersensitivity pneumonitis. There is also some developing Diff dx includes chemotherapy induced pneumonitis, amiodarone  pulmonary toxicity, less likely residential/occupational/environmental exposure. Also less likely would be acute pneumocystis pneumonia. I do not think this is a bacterial infection.  ? ?Discussed possibility of bronchoscopy however his oxygen requirment is too high for a BAL and there is risk for post-procedural respiratory failure.  ? ?Stop amiodarone. Will initiate steroids solumedrol 40 mg BID. He is already on a PPI.  ? ?PCCM will follow.  ? ?Lenice Llamas, MD ?Pulmonary and Critical Care Medicine ?[place] ?09/21/2021 10:50 AM ?Pager: see AMION ? ?If no response to pager, please call critical care on call (see AMION) until 7pm ?After 7:00 pm call Elink   ? ? ?Labs   ?CBC: ?Recent Labs  ?Lab 09/14/21 ?2000 09/16/21 ?2013 09/17/21 ?0234 09/18/21 ?2426 09/19/21 ?0310 09/20/21 ?0403 09/21/21 ?8341  ?WBC 30.7*   < > 22.7* 27.7* 20.6* 20.2* 22.1*  ?NEUTROABS 20.3*  --   --   --   --  18.6*  --   ?HGB 9.9*   < > 9.0* 9.9* 9.4* 9.0* 9.3*  ?HCT 31.0*   < > 27.6* 31.6* 28.6* 27.7* 28.3*  ?MCV 94.5   < > 93.9 95.5 91.7 92.0 91.6  ?PLT 93*   < > 196 321 353 405* 454*  ? < > = values in this interval not displayed.  ? ? ?Basic Metabolic Panel: ?Recent Labs  ?Lab 09/16/21 ?2013 09/16/21 ?2033 09/17/21 ?0234 09/18/21 ?9622 09/19/21 ?0310 09/20/21 ?0403 09/21/21 ?2979  ?NA 134*   < > 136 136 134* 133* 134*  ?K 3.6   < > 3.1* 3.3* 3.3* 3.5 3.9  ?CL 104  --  108 107 106 107 106  ?CO2 18*  --  20* 20* 19* 20* 20*  ?GLUCOSE 136*  --  151* 120* 136* 120* 143*  ?BUN 12  --  '13 12 12 16 12  '$ ?CREATININE 1.01  --  1.01 0.78 0.81 0.71 0.79  ?CALCIUM 8.0*  --  7.5* 8.1* 7.7* 7.7* 7.8*  ?MG 1.9  --   --  2.0  --   --  1.7  ?PHOS  --   --   --   --   --   --  2.5  ? < > = values in this interval not displayed.  ? ?GFR: ?Estimated Creatinine Clearance: 70 mL/min (by C-G formula based on SCr of 0.79 mg/dL). ?Recent Labs  ?Lab 09/16/21 ?2014 09/16/21 ?2234 09/17/21 ?0234 09/18/21 ?8921 09/19/21 ?0310 09/20/21 ?0403 09/21/21 ?1941   ?PROCALCITON  --  0.21 0.31  --   --   --   --   ?WBC  --   --  22.7* 27.7* 20.6* 20.2* 22.1*  ?LATICACIDVEN 1.7  --   --   --   --   --   --   ? ? ?Liver Function Tests: ?Recent Labs  ?Lab 09/14/21 ?2000 09/21/21 ?0322  ?AST 24 76*  ?ALT 28 59*  ?ALKPHOS 144* 91  ?BILITOT 0.8 0.6  ?PROT 5.8* 5.1*  ?ALBUMIN 3.2* 2.1*  ? ?No results for input(s): LIPASE, AMYLASE in the last 168 hours. ?No results for input(s): AMMONIA in the last 168 hours. ? ?ABG ?   ?Component Value Date/Time  ? PHART 7.47 (H) 09/19/2021  8657  ? PCO2ART 25 (L) 09/19/2021 0332  ? PO2ART 57 (L) 09/19/2021 0332  ? HCO3 18.2 (L) 09/19/2021 0332  ? TCO2 20 (L) 09/16/2021 2033  ? ACIDBASEDEF 3.8 (H) 09/19/2021 0332  ? O2SAT 91 09/19/2021 0332  ?  ? ?Coagulation Profile: ?No results for input(s): INR, PROTIME in the last 168 hours. ? ?Cardiac Enzymes: ?No results for input(s): CKTOTAL, CKMB, CKMBINDEX, TROPONINI in the last 168 hours. ? ?HbA1C: ?Hgb A1c MFr Bld  ?Date/Time Value Ref Range Status  ?07/26/2021 11:15 AM 7.1 (H) 4.8 - 5.6 % Final  ?  Comment:  ?  (NOTE) ?        Prediabetes: 5.7 - 6.4 ?        Diabetes: >6.4 ?        Glycemic control for adults with diabetes: <7.0 ?  ?08/05/2017 08:45 AM 5.8 (H) 4.8 - 5.6 % Final  ?  Comment:  ?  (NOTE) ?Pre diabetes:          5.7%-6.4% ?Diabetes:              >6.4% ?Glycemic control for   <7.0% ?adults with diabetes ?  ? ? ?CBG: ?No results for input(s): GLUCAP in the last 168 hours. ? ?Review of Systems:   ?Denies productive cough ?+dyspnea, fatigue ?- chest pain ?- le edema ?- skin changes ? ?Past Medical History:  ?He,  has a past medical history of Arthritis, Ascending aortic aneurysm ([place]), BPH (benign prostatic hyperplasia), Chronic diastolic CHF (congestive heart failure) ([place]), Coronary atherosclerosis of native coronary artery, Essential hypertension, Mixed hyperlipidemia, Myocardial infarction ([place]), PAD (peripheral artery disease) ([place]), PAF (paroxysmal atrial fibrillation) ([place]), and  Supraventricular tachycardia ([place]).  ? ?Surgical History:  ? ?Past Surgical History:  ?Procedure Laterality Date  ? ASCENDING AORTIC ANEURYSM REPAIR  12/2011 [facility]  ? 26 mm.Dacron graft  ? CORONARY ARTERY BYPASS GRAFT

## 2021-09-21 NOTE — TOC Progression Note (Signed)
Transition of Care (TOC) - Progression Note  ? ? ?Patient Details  ?Name: Maor Meckel. ?MRN: [id number] ?Date of Birth: 08/20/1940 ? ?Transition of Care (TOC) CM/SW Contact  ?Zenon Mayo, RN ?Phone Number: ?09/21/2021, 3:17 PM ? ?Clinical Narrative:    ?From home , acute resp failure with sepsis, ct of chest , on 10 liters HFNC. TOC will continue to follow for dc needs.  ? ? ?  ?  ? ?Expected Discharge Plan and Services ?  ?  ?  ?  ?  ?                ?  ?  ?  ?  ?  ?  ?  ?  ?  ?  ? ? ?Social Determinants of Health (SDOH) Interventions ?  ? ?Readmission Risk Interventions ?   ? View : No data to display.  ?  ?  ?  ? ? ?

## 2021-09-21 NOTE — Progress Notes (Signed)
?   ? ? ? ? ?[facility] ? ?Date of Admission:  09/16/2021    ? ? ?Lines:  ?Right chest chronic chemo port removed 5/9 ?  ?Abx: ?5/6-c vanc ?  ?5/6-8 ceftriaxone                                                          ?5/07 azith ?  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  ?Agree bsi likely clabsi. Given cancer history at risk for IE and will need to check that out ?  ?Agree would need to remove the port for improved chance clearing the bacteremia. Certainly right sided endocarditis with pulm involvement is possible in terms of his pulm parenchymal opacity ?  ?Unclear what the cough was; getting better; cxr on 5/6 perihilar interstitial opacity and cardiomegaly ?fluid. Procal < 0.5 x2 ? ? ?----- ?5/11 assessment ?Continued to have significant high o2 demand ?Ct chest done bilateral ggo. Pulmonary consulted and suspect a extrinsic pneumonitis ?I had some concern we are missing a bacterial process, but agree with holding abx for now while accessing response to steroid ? ?He continues to tolerate vanc ? ?Staph epi/hominis both resistant to oxacillin ?Catheter tip cx in progress ? ? ? ?-finish 5 days vancomycin from catheter removal (end date 5/14) ?-pneumonitis w/u and management per pulmnology ?-will continue to reassess for bacterial pna ?-discussed with primary team ? ? ?Active Problems: ?  Essential hypertension ?  Coronary artery disease involving coronary bypass graft of native heart with angina pectoris ([place]) ?  Paroxysmal atrial fibrillation (HCC) ?  Catheter-related bloodstream infection ?  Sarcoma ([place]) ?  Hypokalemia ? ? ?No Active Allergies ? ?Scheduled Meds: ? apixaban  2.5 mg Oral BID  ?  atorvastatin  80 mg Oral QHS  ? clopidogrel  75 mg Oral Q breakfast  ? diltiazem  240 mg Oral Daily  ? furosemide  40 mg Intravenous BID  ? methylPREDNISolone (SOLU-MEDROL) injection  80 mg Intravenous Daily  ? pantoprazole  40 mg Oral Daily  ? sodium chloride flush  3 mL Intravenous Q12H  ? tamsulosin  0.4 mg Oral QHS  ? ?Continuous Infusions: ? sodium chloride    ? vancomycin 1,000 mg (09/21/21 0037)  ? ?PRN Meds:.acetaminophen **OR** acetaminophen, albuterol, lip balm, ondansetron **OR** ondansetron (ZOFRAN) IV, oxyCODONE, senna-docusate, sodium chloride ? ? ?SUBJECTIVE: ?Per  nursing staff stable/slight improved o2 requirement but still on high flow ?Chest ct done. Pulm consulted and suspect extrinsic pneumonitis process ?Afebrile ?Wbc stable ? ? ?Review of Systems: ?ROS ?All other ROS was negative, except mentioned above ? ? ? ? ?OBJECTIVE: ?Vitals:  ? 09/21/21 0416 09/21/21 0737 09/21/21 1129 09/21/21 1316  ?BP: 124/63 124/63 132/74 138/77  ?Pulse: 83  87 78  ?Resp: 18 (!) 76 18 (!) 23  ?Temp: 99.7 ?F (37.6 ?C)  97.7 ?F (36.5 ?C)   ?TempSrc: Oral  Axillary   ?SpO2: 94% 97% 94% 94%  ?Weight:      ?Height:      ? ?Body mass index is  26.24 kg/m?. ? ?Physical Exam ?General/constitutional: no distress, pleasant; conversant; on hfnc ?HEENT: Normocephalic, PER, Conj Clear, EOMI, Oropharynx clear ?Neck supple ?CV: rrr no mrg ?Lungs: normal respiratory effort; clear ?Abd: Soft, Nontender ?Ext: no edema ?Skin: No Rash ?Neuro: nonfocal ?MSK: right aka  ? ? ? ? ? ? ? ?Lab Results ?Lab Results  ?Component Value Date  ? WBC 22.1 (H) 09/21/2021  ? HGB 9.3 (L) 09/21/2021  ? HCT 28.3 (L) 09/21/2021  ? MCV 91.6 09/21/2021  ? PLT 454 (H) 09/21/2021  ?  ?Lab Results  ?Component Value Date  ? CREATININE 0.79 09/21/2021  ? BUN 12 09/21/2021  ? NA 134 (L) 09/21/2021  ? K 3.9 09/21/2021  ? CL 106 09/21/2021  ? CO2 20 (L) 09/21/2021  ?  ?Lab Results  ?Component Value Date  ? ALT 59 (H) 09/21/2021  ? AST 76 (H) 09/21/2021  ? ALKPHOS 91  09/21/2021  ? BILITOT 0.6 09/21/2021  ?  ? ? ?Microbiology: ?Recent Results (from the past 240 hour(s))  ?Blood culture (routine x 2)     Status: Abnormal  ? Collection Time: 09/16/21  8:03 PM  ? Specimen: BLOOD RIGHT ARM  ?Result Value Ref Range Status  ? Specimen Description BLOOD RIGHT ARM  Final  ? Special Requests   Final  ?  BOTTLES DRAWN AEROBIC AND ANAEROBIC Blood Culture adequate volume  ? Culture  Setup Time   Final  ?  GRAM POSITIVE COCCI IN CLUSTERS ?IN BOTH AEROBIC AND ANAEROBIC BOTTLES ?CRITICAL VALUE NOTED.  VALUE IS CONSISTENT WITH PREVIOUSLY REPORTED AND CALLED VALUE. ?  ? Culture (A)  Final  ?  STAPHYLOCOCCUS HOMINIS ?STAPHYLOCOCCUS EPIDERMIDIS ?SUSCEPTIBILITIES PERFORMED ON PREVIOUS CULTURE WITHIN THE LAST 5 DAYS. ?Performed at [facility], [place] [address], [place], [place] [address] ?  ? Report Status 09/20/2021 FINAL  Final  ?Resp Panel by RT-PCR (Flu A&B, Covid)     Status: None  ? Collection Time: 09/16/21  8:04 PM  ? Specimen: Nasopharyngeal(NP) swabs in vial transport medium  ?Result Value Ref Range Status  ? SARS Coronavirus 2 by RT PCR NEGATIVE NEGATIVE Final  ?  Comment: (NOTE) ?SARS-CoV-2 target nucleic acids are NOT DETECTED. ? ?The SARS-CoV-2 RNA is generally detectable in upper respiratory ?specimens during the acute phase of infection. The lowest ?concentration of SARS-CoV-2 viral copies this assay can detect is ?138 copies/mL. A negative result does not preclude SARS-Cov-2 ?infection and should not be used as the sole basis for treatment or ?other patient management decisions. A negative result may occur with  ?improper specimen collection/handling, submission of specimen other ?than nasopharyngeal swab, presence of viral mutation(s) within the ?areas targeted by this assay, and inadequate number of viral ?copies(<138 copies/mL). A negative result must be combined with ?clinical observations, patient history, and epidemiological ?information. The expected result is  Negative. ? ?Fact Sheet for Patients:  ?EntrepreneurPulse.com.au ? ?Fact Sheet for Healthcare Providers:  ?IncredibleEmployment.be ? ?This test is no t yet approved or cleared by the [place] FDA and  ?has been authorized for detection and/or diagnosis of SARS-CoV-2 by ?FDA under an Emergency Use Authorization (EUA). This EUA will remain  ?in effect (meaning this test can be used) for the duration of the ?COVID-19 declaration under Section 564(b)(1) of the Act, 21 ?U.S.C.section 360bbb-3(b)(1), unless the authorization is terminated  ?or revoked sooner.  ? ? ?  ? Influenza A by PCR NEGATIVE NEGATIVE Final  ? Influenza B by PCR NEGATIVE NEGATIVE Final  ?  Comment: (NOTE) ?The Xpert Xpress SARS-CoV-2/FLU/RSV plus assay is intended as an aid ?in the diagnosis of influenza from Nasopharyngeal swab specimens and ?should not be used as a sole basis for treatment. Nasal washings and ?aspirates are unacceptable for Xpert Xpress SARS-CoV-2/FLU/RSV ?testing. ? ?Fact Sheet for Patients: ?EntrepreneurPulse.com.au ? ?Fact Sheet for Healthcare Providers: ?IncredibleEmployment.be ? ?This test is not yet approved or cleared by the [place] FDA and ?has been authorized for detection and/or diagnosis of SARS-CoV-2 by ?FDA under an Emergency Use Authorization (EUA). This EUA will remain ?in effect (meaning this test can be used) for the duration of the ?COVID-19 declaration under Section 564(b)(1) of the Act, 21 U.S.C. ?section 360bbb-3(b)(1), unless the authorization is terminated or ?revoked. ? ?Performed at [facility], [place] [address], [place], [place] ?[address] ?  ?Blood culture (routine x 2)     Status: Abnormal  ? Collection Time: 09/16/21  8:08 PM  ? Specimen: BLOOD  ?Result Value Ref Range Status  ? Specimen Description BLOOD LEFT ANTECUBITAL  Final  ? Special Requests   Final  ?  BOTTLES DRAWN AEROBIC AND ANAEROBIC Blood Culture adequate  volume  ? Culture  Setup Time   Final  ?  GRAM POSITIVE COCCI IN CLUSTERS ?IN BOTH AEROBIC AND ANAEROBIC BOTTLES ?Organism ID to follow ?CRITICAL RESULT CALLED TO, READ BACK BY AND VERIFIED WITH: G. Amory, AT [phone]

## 2021-09-21 NOTE — TOC Initial Note (Addendum)
Transition of Care (TOC) - Initial/Assessment Note  ? ? ?Patient Details  ?Name: Eric Beltran. ?MRN: [id number] ?Date of Birth: May 31, 1940 ? ?Transition of Care (TOC) CM/SW Contact:    ?Zenon Mayo, RN ?Phone Number: ?09/21/2021, 3:45 PM ? ?Clinical Narrative:                 ?NCM spoke with patient at bedside, he lives alone in [place] address he is going to is [address] [place] [address].  NCM offered choice for agency if he may need it , he states he is not ready to pick an agency yet.  NCM will check back with him again regarding agency choice if he needs it.  He states if he needs oxygen at home he would like to go thru [employer] in [place].  He uses [employer]. He states  his brother or son will transport him home at dc.  He has PCP. Dr. Nevada Crane.  He does not have any HH at this time.  He states his support is his son and daughter and daughter n Sports coach.  He presents with acute resp failure with sepsis , ct of chest , and will be here thru w/end. Conts on 10 liters HFNC.   ? ?Expected Discharge Plan: [place] ?Barriers to Discharge: Continued Medical Work up ? ? ?Patient Goals and CMS Choice ?Patient states their goals for this hospitalization and ongoing recovery are:: return home ?  ?  ? ?Expected Discharge Plan and Services ?Expected Discharge Plan: [place] ?  ?Discharge Planning Services: CM Consult ?  ?  ?                ?  ?  ?  ?  ?  ?HH Arranged: NA ?  ?  ?  ?  ? ?Prior Living Arrangements/Services ?  ?Lives with:: Self ?Patient language and need for interpreter reviewed:: Yes ?Do you feel safe going back to the place where you live?: Yes      ?Need for Family Participation in Patient Care: Yes (Comment) ?Care giver support system in place?: Yes (comment) ?  ?Criminal Activity/Legal Involvement Pertinent to Current Situation/Hospitalization: No - Comment as needed ? ?Activities of Daily Living ?Home Assistive Devices/Equipment: Cane  (specify quad or straight), Walker (specify type) ?ADL Screening (condition at time of admission) ?Patient's cognitive ability adequate to safely complete daily activities?: Yes ?Is the patient deaf or have difficulty hearing?: No ?Does the patient have difficulty seeing, even when wearing glasses/contacts?: No ?Does the patient have difficulty concentrating, remembering, or making decisions?: No ?Patient able to express need for assistance with ADLs?: No ?Does the patient have difficulty dressing or bathing?: No ?Independently performs ADLs?: Yes (appropriate for developmental age) ?Does the patient have difficulty walking or climbing stairs?: Yes ?Weakness of Legs: Both ?Weakness of Arms/Hands: Both ? ?Permission Sought/Granted ?  ?  ?   ?   ?   ?   ? ?Emotional Assessment ?Appearance:: Appears stated age ?Attitude/Demeanor/Rapport: Engaged ?Affect (typically observed): Appropriate ?Orientation: : Oriented to Self, Oriented to Place, Oriented to  Time, Oriented to Situation ?Alcohol / Substance Use: Not Applicable ?Psych Involvement: No (comment) ? ?Admission diagnosis:  Acute respiratory failure with hypoxia ([place]) [J96.01] ?Acute on chronic respiratory failure with hypoxia (HCC) [J96.21] ?Community acquired pneumonia, unspecified laterality [J18.9] ?Patient Active Problem List  ? Diagnosis Date Noted  ? Acute on chronic respiratory failure with hypoxia (HCC)   ?  Hypokalemia 09/18/2021  ? Sarcoma ([place])   ? Bloodstream infection due to central venous catheter 09/16/2021  ? Paroxysmal atrial fibrillation ([place]) 07/25/2021  ? BPH (benign prostatic hyperplasia) 07/25/2021  ? Acute bronchitis/reactive airway disease 07/25/2021  ? Chest pain 07/24/2021  ? Mass of left side of neck 11/25/2018  ? Coronary artery disease   ? S/P CABG x 4 08/06/2017  ? Coronary artery disease involving coronary bypass graft of native heart with angina pectoris ([place])   ? BPH with obstruction/lower urinary tract symptoms 03/06/2017  ? Unstable  angina ([place]) 03/05/2017  ? Lumbar spondylosis 02/19/2017  ? Osteoarthritis of right hip 02/07/2016  ? Status post total replacement of right hip 02/07/2016  ? Abnormal cardiovascular function study   ? Abnormal myocardial perfusion study 06/09/2014  ? Cardiac murmur 05/21/2014  ? Renal mass 07/31/2012  ? Urinary retention 07/31/2012  ? Actinic keratosis 01/17/2012  ? Personal history of malignant neoplasm of skin 10/12/2010  ? Aneurysm of thoracic aorta ([place]) 04/13/2010  ? Mixed hyperlipidemia 07/20/2009  ? Essential hypertension 07/20/2009  ? ?PCP:  Celene Squibb, MD ?Pharmacy:   ?[facility] [address] ?[place] ?[place] [address] ?Phone: [phone] Fax: [phone] ? ? ? ? ?Social Determinants of Health (SDOH) Interventions ?  ? ?Readmission Risk Interventions ? ?  09/21/2021  ?  3:19 PM  ?Readmission Risk Prevention Plan  ?Transportation Screening Complete  ?PCP or Specialist Appt within 3-5 Days Complete  ?[place] or Home Care Consult Complete  ?Social Work Consult for [place] Planning/Counseling Complete  ?Palliative Care Screening Not Applicable  ?Medication Review Press photographer) Complete  ? ? ? ?

## 2021-09-21 NOTE — Progress Notes (Signed)
During rounding, RN assessed pt's HR to be in the 130's to 140's but remained sinus tach on the monitor. Pt report some discomfort and tightness on his chest. Pt rate the pain as 2 on a scale of 0-10. Pt given prn Nitro tablets x3 effective and pt report relieve. Pt HR remained tachy still and oxygen de-sating to the 80's. MD on-call notified and new order received for EKG and RT notified of pt's oxygenation and RT asked RN to increase pt's Fio2 from 60% to 100%.  ? ? 09/21/21 0010  ?Vitals  ?Temp 98.5 ?F (36.9 ?C)  ?Temp Source Oral  ?BP 116/78  ?MAP (mmHg) 89  ?BP Location Right Arm  ?BP Method Automatic  ?Patient Position (if appropriate) Lying  ?Pulse Rate (!) 138  ?Pulse Rate Source Monitor  ?ECG Heart Rate (!) 137  ?Resp 20  ?Oxygen Therapy  ?SpO2 (!) 88 %  ?O2 Device HFNC  ?Heater temperature 96.4 ?F (35.8 ?C)  ?O2 Flow Rate (L/min) 20 L/min  ?Patient Activity (if Appropriate) In bed  ?Pulse Oximetry Type Continuous  ? ? ?

## 2021-09-21 NOTE — Progress Notes (Signed)
Pt remain stable in bed with HR now in 90's to 100. MD on-call notified and new orders received for lab. Will continue to closely monitor pt. Pt sleeping comfortably in bed with call light within reach. Eric Heady RN ? ? 09/21/21 0221  ?Vitals  ?Temp 99.2 ?F (37.3 ?C)  ?Temp Source Oral  ?BP 112/63  ?MAP (mmHg) 76  ?BP Location Right Arm  ?BP Method Automatic  ?Patient Position (if appropriate) Lying  ?Pulse Rate 96  ?Pulse Rate Source Monitor  ?ECG Heart Rate 96  ?Resp 20  ?MEWS COLOR  ?MEWS Score Color Green  ?Oxygen Therapy  ?SpO2 96 %  ?O2 Device HFNC  ?O2 Flow Rate (L/min) 20 L/min  ?FiO2 (%) 80 %  ?MEWS Score  ?MEWS Temp 0  ?MEWS Systolic 0  ?MEWS Pulse 0  ?MEWS RR 0  ?MEWS LOC 0  ?MEWS Score 0  ? ? ?

## 2021-09-22 DIAGNOSIS — I25709 Atherosclerosis of coronary artery bypass graft(s), unspecified, with unspecified angina pectoris: Secondary | ICD-10-CM | POA: Diagnosis not present

## 2021-09-22 DIAGNOSIS — J9621 Acute and chronic respiratory failure with hypoxia: Secondary | ICD-10-CM | POA: Diagnosis not present

## 2021-09-22 DIAGNOSIS — T80211D Bloodstream infection due to central venous catheter, subsequent encounter: Secondary | ICD-10-CM | POA: Diagnosis not present

## 2021-09-22 DIAGNOSIS — T80211A Bloodstream infection due to central venous catheter, initial encounter: Secondary | ICD-10-CM | POA: Diagnosis not present

## 2021-09-22 DIAGNOSIS — J984 Other disorders of lung: Secondary | ICD-10-CM

## 2021-09-22 DIAGNOSIS — J189 Pneumonia, unspecified organism: Secondary | ICD-10-CM | POA: Diagnosis not present

## 2021-09-22 DIAGNOSIS — C499 Malignant neoplasm of connective and soft tissue, unspecified: Secondary | ICD-10-CM | POA: Diagnosis not present

## 2021-09-22 LAB — MAGNESIUM: Magnesium: 2 mg/dL (ref 1.7–2.4)

## 2021-09-22 LAB — CULTURE, BLOOD (ROUTINE X 2)
Culture: NO GROWTH
Culture: NO GROWTH
Special Requests: ADEQUATE
Special Requests: ADEQUATE

## 2021-09-22 LAB — BASIC METABOLIC PANEL
Anion gap: 12 (ref 5–15)
BUN: 16 mg/dL (ref 8–23)
CO2: 21 mmol/L — ABNORMAL LOW (ref 22–32)
Calcium: 8.4 mg/dL — ABNORMAL LOW (ref 8.9–10.3)
Chloride: 100 mmol/L (ref 98–111)
Creatinine, Ser: 0.85 mg/dL (ref 0.61–1.24)
GFR, Estimated: >60 mL/min (ref >60)
Glucose, Bld: 222 mg/dL — ABNORMAL HIGH (ref 70–99)
Potassium: 4 mmol/L (ref 3.5–5.1)
Sodium: 133 mmol/L — ABNORMAL LOW (ref 135–145)

## 2021-09-22 LAB — CBC
HCT: 28.8 % — ABNORMAL LOW (ref 39.0–52.0)
Hemoglobin: 9.6 g/dL — ABNORMAL LOW (ref 13.0–17.0)
MCH: 30.3 pg (ref 26.0–34.0)
MCHC: 33.3 g/dL (ref 30.0–36.0)
MCV: 90.9 fL (ref 80.0–100.0)
Platelets: 490 10*3/uL — ABNORMAL HIGH (ref 150–400)
RBC: 3.17 MIL/uL — ABNORMAL LOW (ref 4.22–5.81)
RDW: 18.3 % — ABNORMAL HIGH (ref 11.5–15.5)
WBC: 15.8 10*3/uL — ABNORMAL HIGH (ref 4.0–10.5)
nRBC: 0 % (ref 0.0–0.2)

## 2021-09-22 MED ORDER — METOPROLOL TARTRATE 5 MG/5ML IV SOLN
2.5000 mg | INTRAVENOUS | Status: DC | PRN
  Administered 2021-09-22: 2.5 mg via INTRAVENOUS
  Filled 2021-09-22: qty 5

## 2021-09-22 NOTE — Evaluation (Signed)
Physical Therapy Evaluation ?Patient Details ?Name: Eric Beltran. ?MRN: [id number] ?DOB: [birth year] ?Today's Date: 09/22/2021 ? ?History of Present Illness ? Pt is a 81 y/o male admitted secondary to cough and SOB. Found to have Sepsis due to Staph hominis and epidermidis bacteremia, and hypoxic respiratory failure from pneumonitis. PMH includes sarcoma s/p R AKA, CAD, pulmonary mets.  ?Clinical Impression ? Pt admitted secondary to problem above with deficits below. Pt very motivated to work with therapy. Requiring min guard to min A for mobility tasks using RW. Pt on 15 L NRB during mobility tasks and tolerated well; sats at 98-100% throughout. Did experience some fatigue and mild SOB. Anticipate pt will progress well with continued therapies while admitted acutely and will be able to d/c with HHPT. However, should pt have slow progression, may need to consider post acute rehab. Will continue to follow acutely.    ?   ? ?Recommendations for follow up therapy are one component of a multi-disciplinary discharge planning process, led by the attending physician.  Recommendations may be updated based on patient status, additional functional criteria and insurance authorization. ? ?Follow Up Recommendations Home health PT (pending progression) ? ?  ?Assistance Recommended at Discharge Frequent or constant Supervision/Assistance  ?Patient can return home with the following ? A little help with walking and/or transfers;A little help with bathing/dressing/bathroom;Assistance with cooking/housework;Help with stairs or ramp for entrance;Assist for transportation ? ?  ?Equipment Recommendations Other (comment) (TBD)  ?Recommendations for Other Services ?    ?  ?Functional Status Assessment Patient has had a recent decline in their functional status and demonstrates the ability to make significant improvements in function in a reasonable and predictable amount of time.  ? ?  ?Precautions / Restrictions  Precautions ?Precautions: Fall;Other (comment) ?Precaution Comments: R AKA; has prosthetic ?Restrictions ?Weight Bearing Restrictions: No  ? ?  ? ?Mobility ? Bed Mobility ?Overal bed mobility: Needs Assistance ?Bed Mobility: Supine to Sit ?  ?  ?Supine to sit: Min assist ?  ?  ?General bed mobility comments: Min A for LE assist ?  ? ?Transfers ?Overall transfer level: Needs assistance ?Equipment used: Rolling walker (2 wheels) ?Transfers: Sit to/from Stand ?Sit to Stand: Min assist ?  ?  ?  ?  ?  ?General transfer comment: Min A for lift assist and steadying. ?  ? ?Ambulation/Gait ?Ambulation/Gait assistance: Min assist, Min guard ?Gait Distance (Feet): 50 Feet ?Assistive device: Rolling walker (2 wheels) ?Gait Pattern/deviations: Step-to pattern, Decreased step length - right, Decreased step length - left ?Gait velocity: Decreased ?  ?  ?General Gait Details: Slow, cautious gait. Distance limited secondary to fatigue. Min guard to min A for steadying. ? ?Stairs ?  ?  ?  ?  ?  ? ?Wheelchair Mobility ?  ? ?Modified Rankin (Stroke Patients Only) ?  ? ?  ? ?Balance Overall balance assessment: Needs assistance ?Sitting-balance support: No upper extremity supported, Feet supported ?Sitting balance-Leahy Scale: Fair ?  ?  ?Standing balance support: Bilateral upper extremity supported ?Standing balance-Leahy Scale: Poor ?Standing balance comment: Reliant on BUE support ?  ?  ?  ?  ?  ?  ?  ?  ?  ?  ?  ?   ? ? ? ?Pertinent Vitals/Pain Pain Assessment ?Pain Assessment: No/denies pain  ? ? ?Home Living Family/patient expects to be discharged to:: Private residence ?Living Arrangements: Alone ?Available Help at Discharge: Family ?Type of Home: House ?Home Access: Stairs to enter ?Entrance Stairs-Rails: Left ?Entrance Stairs-Number  of Steps: 3 ?  ?Home Layout: One level ?Home Equipment: Conservation officer, nature (2 wheels) ?   ?  ?Prior Function Prior Level of Function : Independent/Modified Independent ?  ?  ?  ?  ?  ?  ?Mobility  Comments: Uses RW for mobility tasks ?  ?  ? ? ?Hand Dominance  ?   ? ?  ?Extremity/Trunk Assessment  ? Upper Extremity Assessment ?Upper Extremity Assessment: Defer to OT evaluation ?  ? ?Lower Extremity Assessment ?Lower Extremity Assessment: RLE deficits/detail;Generalized weakness ?RLE Deficits / Details: R AKA at baseline ?  ? ?Cervical / Trunk Assessment ?Cervical / Trunk Assessment: Normal  ?Communication  ? Communication: No difficulties  ?Cognition Arousal/Alertness: Awake/alert ?Behavior During Therapy: [facility] for tasks assessed/performed ?Overall Cognitive Status: Within Functional Limits for tasks assessed ?  ?  ?  ?  ?  ?  ?  ?  ?  ?  ?  ?  ?  ?  ?  ?  ?General Comments: for basic tasks; did not formally assess ?  ?  ? ?  ?General Comments General comments (skin integrity, edema, etc.): Respiratory present and placed pt on NRB at 15L. Oxygen sats at 98-100% on 15L NRB. ? ?  ?Exercises    ? ?Assessment/Plan  ?  ?PT Assessment Patient needs continued PT services  ?PT Problem List Decreased strength;Decreased activity tolerance;Decreased balance;Decreased mobility;Decreased knowledge of use of DME;Decreased knowledge of precautions;Pain ? ?   ?  ?PT Treatment Interventions DME instruction;Gait training;Stair training;Functional mobility training;Therapeutic activities;Therapeutic exercise;Balance training;Patient/family education   ? ?PT Goals (Current goals can be found in the Care Plan section)  ?Acute Rehab PT Goals ?Patient Stated Goal: to feel better and go home ?PT Goal Formulation: With patient/family ?Time For Goal Achievement: 10/06/21 ?Potential to Achieve Goals: Good ? ?  ?Frequency Min 3X/week ?  ? ? ?Co-evaluation   ?  ?  ?  ?  ? ? ?  ?AM-PAC PT "6 Clicks" Mobility  ?Outcome Measure Help needed turning from your back to your side while in a flat bed without using bedrails?: A Little ?Help needed moving from lying on your back to sitting on the side of a flat bed without using bedrails?: A  Little ?Help needed moving to and from a bed to a chair (including a wheelchair)?: A Little ?Help needed standing up from a chair using your arms (e.g., wheelchair or bedside chair)?: A Little ?Help needed to walk in hospital room?: A Little ?Help needed climbing 3-5 steps with a railing? : A Lot ?6 Click Score: 17 ? ?  ?End of Session Equipment Utilized During Treatment: Gait belt;Oxygen ?Activity Tolerance: Patient limited by fatigue ?Patient left: in bed;with call bell/phone within reach;with family/visitor present (sitting EOB) ?Nurse Communication: Mobility status ?PT Visit Diagnosis: Unsteadiness on feet (R26.81);Muscle weakness (generalized) (M62.81) ?  ? ?Time: 8916-9450 ?PT Time Calculation (min) (ACUTE ONLY): 34 min ? ? ?Charges:   PT Evaluation ?$PT Eval Moderate Complexity: 1 Mod ?PT Treatments ?$Gait Training: 8-22 mins ?  ?   ? ? ?Reuel Derby, PT, DPT  ?Acute Rehabilitation Services  ?Pager: [phone] ?Office: [phone] ? ? ?[place] ?09/22/2021, 4:10 PM ? ?

## 2021-09-22 NOTE — Progress Notes (Signed)
?   ? ? ? ? ?[facility] ? ?Date of Admission:  09/16/2021    ? ? ?Lines:  ?Right chest chronic chemo port removed 5/9 ?  ?Abx: ?5/6-c vanc ?  ?5/6-8 ceftriaxone                                                          ?5/07 azith ?  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  ?Agree bsi likely clabsi. Given cancer history at risk for IE and will need to check that out ?  ?Agree would need to remove the port for improved chance clearing the bacteremia. Certainly right sided endocarditis with pulm involvement is possible in terms of his pulm parenchymal opacity ?  ?Unclear what the cough was; getting better; cxr on 5/6 perihilar interstitial opacity and cardiomegaly ?fluid. Procal < 0.5 x2 ? ?Ct scan 5/11 worsening bilateral opacity. Pulm suspect hypersensitive pneumonitis/chemo toxicity ? ?----- ?5/12 assessment ?Stable hfnc o2 requirement. Not dyspnea on oxygen ?Clinically stable ?Started on high dose methylpred ? ?Wbc actually improving despite steroid ?Afebrile ? ?Cath tip cx ngtd. Still think source staph epi from that (clabsi) ? ? ?-finish vanc on 5/14 ?-continue pneumonitis w/u and management per primary team/pulm ?-at this time doesn't appear lung process related to primary lung infection ?-will sign off ?-discussed with primary team ? ? ? ? ?Active Problems: ?  Essential hypertension ?  Coronary artery disease involving coronary bypass graft of native heart with angina pectoris ([place]) ?  Paroxysmal atrial fibrillation (HCC) ?  Bloodstream infection due to central venous catheter ?  Sarcoma ([place]) ?  Hypokalemia ?  Acute on chronic respiratory failure with hypoxia (HCC) ? ? ?No Active Allergies ? ?Scheduled  Meds: ? apixaban  2.5 mg Oral BID  ? atorvastatin  80 mg Oral QHS  ? clopidogrel  75 mg Oral Q breakfast  ? diltiazem  240 mg Oral Daily  ? methylPREDNISolone (SOLU-MEDROL) injection  80 mg Intravenous Daily  ? pantoprazole  40 mg Oral Daily  ? sodium chloride flush  3 mL Intravenous Q12H  ? tamsulosin  0.4 mg Oral QHS  ? ?Continuous Infusions: ? sodium chloride    ? vancomycin 1,000 mg (09/22/21 0532)  ? ?PRN Meds:.acetaminophen **OR** acetaminophen, albuterol, lip balm, metoprolol tartrate, ondansetron **OR** ondansetron (ZOFRAN) IV, oxyCODONE, senna-docusate, sodium chloride ? ? ?SUBJECTIVE: ?On high dose steroid ?Wbc actually improving ?Afebrile ?Stable hfnc ?Not dyspnea ?Felt well same as yesterday ? ? ? ? ?Review of Systems: ?ROS ?All other ROS was negative, except mentioned above ? ? ? ? ?OBJECTIVE: ?Vitals:  ? 09/22/21 0421 09/22/21 0757 09/22/21 0906 09/22/21 1134  ?BP: 133/69  121/74 (!) 115/57  ?Pulse: 77 (!) 128 94 83  ?Resp: (!) '23 17 17 '$ (!) 21  ?Temp: (!) 97.5 ?F (36.4 ?C)  97.6 ?F (36.4 ?C) 97.6 ?F (36.4 ?C)  ?TempSrc: Oral  Oral Oral  ?SpO2: 98% 98% 94% 92%  ?Weight: 73.2 kg     ?Height:      ? ?Body mass index is  25.28 kg/m?. ? ?Physical Exam ?General/constitutional: no distress, pleasant ?HEENT: Normocephalic, PER, Conj Clear, EOMI, Oropharynx clear ?Neck supple ?CV: rrr no mrg ?Lungs: clear to auscultation, normal respiratory effort ?Abd: Soft, Nontender ?Ext: no edema ?Skin: No Rash -- right chest port site (removed) dressing clean ?Neuro: nonfocal ?MSK: s/p right aka ? ? ? ? ? ? ?Lab Results ?Lab Results  ?Component Value Date  ? WBC 15.8 (H) 09/22/2021  ? HGB 9.6 (L) 09/22/2021  ? HCT 28.8 (L) 09/22/2021  ? MCV 90.9 09/22/2021  ? PLT 490 (H) 09/22/2021  ?  ?Lab Results  ?Component Value Date  ? CREATININE 0.85 09/22/2021  ? BUN 16 09/22/2021  ? NA 133 (L) 09/22/2021  ? K 4.0 09/22/2021  ? CL 100 09/22/2021  ? CO2 21 (L) 09/22/2021  ?  ?Lab Results  ?Component Value Date  ? ALT 59 (H) 09/21/2021   ? AST 76 (H) 09/21/2021  ? ALKPHOS 91 09/21/2021  ? BILITOT 0.6 09/21/2021  ?  ? ? ?Microbiology: ?Recent Results (from the past 240 hour(s))  ?Blood culture (routine x 2)     Status: Abnormal  ? Collection Time: 09/16/21  8:03 PM  ? Specimen: BLOOD RIGHT ARM  ?Result Value Ref Range Status  ? Specimen Description BLOOD RIGHT ARM  Final  ? Special Requests   Final  ?  BOTTLES DRAWN AEROBIC AND ANAEROBIC Blood Culture adequate volume  ? Culture  Setup Time   Final  ?  GRAM POSITIVE COCCI IN CLUSTERS ?IN BOTH AEROBIC AND ANAEROBIC BOTTLES ?CRITICAL VALUE NOTED.  VALUE IS CONSISTENT WITH PREVIOUSLY REPORTED AND CALLED VALUE. ?  ? Culture (A)  Final  ?  STAPHYLOCOCCUS HOMINIS ?STAPHYLOCOCCUS EPIDERMIDIS ?SUSCEPTIBILITIES PERFORMED ON PREVIOUS CULTURE WITHIN THE LAST 5 DAYS. ?Performed at [facility], [place] [address], [place], [place] [address] ?  ? Report Status 09/20/2021 FINAL  Final  ?Resp Panel by RT-PCR (Flu A&B, Covid)     Status: None  ? Collection Time: 09/16/21  8:04 PM  ? Specimen: Nasopharyngeal(NP) swabs in vial transport medium  ?Result Value Ref Range Status  ? SARS Coronavirus 2 by RT PCR NEGATIVE NEGATIVE Final  ?  Comment: (NOTE) ?SARS-CoV-2 target nucleic acids are NOT DETECTED. ? ?The SARS-CoV-2 RNA is generally detectable in upper respiratory ?specimens during the acute phase of infection. The lowest ?concentration of SARS-CoV-2 viral copies this assay can detect is ?138 copies/mL. A negative result does not preclude SARS-Cov-2 ?infection and should not be used as the sole basis for treatment or ?other patient management decisions. A negative result may occur with  ?improper specimen collection/handling, submission of specimen other ?than nasopharyngeal swab, presence of viral mutation(s) within the ?areas targeted by this assay, and inadequate number of viral ?copies(<138 copies/mL). A negative result must be combined with ?clinical observations, patient history, and  epidemiological ?information. The expected result is Negative. ? ?Fact Sheet for Patients:  ?EntrepreneurPulse.com.au ? ?Fact Sheet for Healthcare Providers:  ?IncredibleEmployment.be ? ?This test is no t yet approved or cleared by the [place] FDA and  ?has been authorized for detection and/or diagnosis of SARS-CoV-2 by ?FDA under an Emergency Use Authorization (EUA). This EUA will remain  ?in effect (meaning this test can be used) for the duration of the ?COVID-19 declaration under Section 564(b)(1) of the Act, 21 ?U.S.C.section 360bbb-3(b)(1), unless the authorization is terminated  ?or revoked sooner.  ? ? ?  ? Influenza A by PCR NEGATIVE NEGATIVE Final  ? Influenza B by PCR  NEGATIVE NEGATIVE Final  ?  Comment: (NOTE) ?The Xpert Xpress SARS-CoV-2/FLU/RSV plus assay is intended as an aid ?in the diagnosis of influenza from Nasopharyngeal swab specimens and ?should not be used as a sole basis for treatment. Nasal washings and ?aspirates are unacceptable for Xpert Xpress SARS-CoV-2/FLU/RSV ?testing. ? ?Fact Sheet for Patients: ?EntrepreneurPulse.com.au ? ?Fact Sheet for Healthcare Providers: ?IncredibleEmployment.be ? ?This test is not yet approved or cleared by the [place] FDA and ?has been authorized for detection and/or diagnosis of SARS-CoV-2 by ?FDA under an Emergency Use Authorization (EUA). This EUA will remain ?in effect (meaning this test can be used) for the duration of the ?COVID-19 declaration under Section 564(b)(1) of the Act, 21 U.S.C. ?section 360bbb-3(b)(1), unless the authorization is terminated or ?revoked. ? ?Performed at [facility], [place] [address], [place], [place] ?[address] ?  ?Blood culture (routine x 2)     Status: Abnormal  ? Collection Time: 09/16/21  8:08 PM  ? Specimen: BLOOD  ?Result Value Ref Range Status  ? Specimen Description BLOOD LEFT ANTECUBITAL  Final  ? Special Requests   Final  ?  BOTTLES  DRAWN AEROBIC AND ANAEROBIC Blood Culture adequate volume  ? Culture  Setup Time   Final  ?  GRAM POSITIVE COCCI IN CLUSTERS ?IN BOTH AEROBIC AND ANAEROBIC BOTTLES ?Organism ID to follow ?CRITICAL RESULT CALLED TO, READ BAC

## 2021-09-22 NOTE — Progress Notes (Signed)
?   09/22/21 1115  ?Clinical Encounter Type  ?Visited With Patient not available  ?Visit Type Follow-up;Spiritual support  ?Referral From Nurse  ?Consult/Referral To Chaplain  ? ?Chaplain responded to a spiritual consult for prayer. The patient, Eric Beltran, was asleep on the first visit and the second attempt he was receiving care and I was unable to visit with him.  ? ?Danice Goltz  ?Chaplain  ?[facility]  ?[phone] ?

## 2021-09-22 NOTE — Progress Notes (Signed)
Pt stabled during shift but around 0525 pt's HR increased to 120's and 130's on the monitor. Pt assessed to be comfortable in bed watching a movie. Pt was asked if he was in any pain or dyspnea and he endorsed some slight chest discomfort which he rated as a 2 on a scale of 0-10. Pt denies any SOB or dyspnea. Pt given prn pain med. HR rhythm is sinus tachycardia and pt in bed with call light within reach. Will report off to oncoming RN. Delia Heady RN ?

## 2021-09-22 NOTE — Progress Notes (Signed)
?PROGRESS NOTE ? ?Eric Beltran. SEG:[id number] DOB: [birth year] DOA: 09/16/2021 ?PCP: Celene Squibb, MD ? ? LOS: 6 days  ? 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  Patient tells me he has been short of breath for the past 1 to 2 weeks but in the last 3 days prior to arrival he was barely able to walk 10 to 15 feet without getting out of breath.  He was found to have Staphylococcus bacteremia, and chemotherapy port was removed on 5/9.  ID consulted as well ? ?Subjective / 24h Interval events: ?Overall he feels okay.  As long as he is using oxygen he does not feel short of breath but sats drop precipitously when oxygen comes off ? ?Assesement and Plan: ?Active Problems: ?  Bloodstream infection due to central venous catheter ?  Paroxysmal atrial fibrillation (HCC) ?  Coronary artery disease involving coronary bypass graft of native heart with angina pectoris ([place]) ?  Essential hypertension ?  Sarcoma ([place]) ?  Hypokalemia ?  Acute on chronic respiratory failure with hypoxia (HCC) ?  Pneumonitis ? ?Principal problem ?Sepsis due to Staph hominis and epidermidis bacteremia-met sepsis criteria on admission with fever of 101.4, leukocytosis, tachycardia and the source.  ID consulted and following, appreciate input.  Currently he is on vancomycin.  This was felt to be catheter related bloodstream infection, IR consulted and he is status post port removal 5/9.  Dr. Cathlean Sauer discussed with the patient's primary oncology team at [facility] regarding catheter removal.  Continue vancomycin for a total of 5 days post catheter removal, end date 5/14 ? ?Active problems ?Acute hypoxic respiratory failure due to hypersensitivity pneumonitis-chest x-ray with bilateral pulmonary infiltrates, there was concern initially for  infectious process. On outside records review, patient had a CT scan of the chest on 09/15/2021 which showed some patchy groundglass opacities, concerning for fibrotic reaction to chemotherapy.  Pulmonary consulted, repeat CT scan here concerning for hypersensitivity pneumonitis due to amiodarone versus chemotherapy induced pneumonitis.  He was started on steroids.  This is less likely to be bacterial pneumonia, other than vancomycin for the reasons above hold further antibacterials.  After discussing with pulmonary amiodarone was also discontinued ? ?Acute on chronic diastolic VPX-1G echo done 5/8 showed EF 65 to 65%, no WMA.  He has grade 1 diastolic dysfunction.  RV was normal.  Clinically appears euvolemic, hold further Lasix ? ?Paroxysmal atrial fibrillation ([place]), with RVR-Continue rate control with diltiazem, amiodarone on hold.  Continue Eliquis.  Again had an episode of RVR this morning with rates into the 130s.  Hemodynamically stable, fairly asymptomatic.  Likely due to underlying pulmonary issues, also IV diuresis probably contributing, stop further Lasix.  After receiving his morning Cardizem his rates went into the 80s-90s ?  ?Coronary artery disease involving coronary bypass graft of native heart with angina pectoris (HCC) -No chest pain, continue blood pressure control and antiplatelet therapy with clopidogrel.  ?  ?Essential hypertension -Diuresis with furosemide as tolerated for acute pulmonary edema.  Furosemide now on hold, blood pressure stable ?  ?Sarcoma ([place]) -Sp right AKA. Positive pulmonary metastasis. Undergoing chemotherapy as outpatient with [facility], with Dr. Lendon Collar.  Dr. Cathlean Sauer discussed with cancer center in [place] ? ?Hypokalemia -replete as indicated, continue to monitor ?  ?Hyponatremia-mild, continue to monitor while on diuresis ?  ?Scheduled  Meds: ? apixaban  2.5 mg Oral BID  ? atorvastatin  80 mg Oral QHS  ? clopidogrel  75 mg Oral Q breakfast  ? diltiazem  240 mg Oral Daily  ?  methylPREDNISolone (SOLU-MEDROL) injection  80 mg Intravenous Daily  ? pantoprazole  40 mg Oral Daily  ? sodium chloride flush  3 mL Intravenous Q12H  ? tamsulosin  0.4 mg Oral QHS  ? ?Continuous Infusions: ? sodium chloride    ? vancomycin 1,000 mg (09/22/21 0532)  ? ?PRN Meds:.acetaminophen **OR** acetaminophen, albuterol, lip balm, metoprolol tartrate, ondansetron **OR** ondansetron (ZOFRAN) IV, oxyCODONE, senna-docusate, sodium chloride ? ?Diet Orders (From admission, onward)  ? ?  Start     Ordered  ? 09/17/21 0003  Diet Heart Room service appropriate? Yes; Fluid consistency: Thin  Diet effective now       ?Question Answer Comment  ?Room service appropriate? Yes   ?Fluid consistency: Thin   ?  ? 09/17/21 0003  ? ?  ?  ? ?  ? ? ?DVT prophylaxis: apixaban (ELIQUIS) tablet 2.5 mg Start: 09/17/21 0015 ?apixaban (ELIQUIS) tablet 2.5 mg  ? ?Lab Results  ?Component Value Date  ? PLT 490 (H) 09/22/2021  ? ? ?  Code Status: Full Code ? ?Family Communication: Son was present at bedside ? ?Status is: Inpatient ? ?Remains inpatient appropriate because: Severity of illness ? ?Level of care: Progressive ? ?Consultants:  ?ID ?IR ? ?Procedures:  ?2d echo ? ?Objective: ?Vitals:  ? 09/22/21 0421 09/22/21 0757 09/22/21 0906 09/22/21 1134  ?BP: 133/69  121/74 (!) 115/57  ?Pulse: 77 (!) 128 94 83  ?Resp: (!) 23 17 17  (!) 21  ?Temp: (!) 97.5 ?F (36.4 ?C)  97.6 ?F (36.4 ?C) 97.6 ?F (36.4 ?C)  ?TempSrc: Oral  Oral Oral  ?SpO2: 98% 98% 94% 92%  ?Weight: 73.2 kg     ?Height:      ? ? ?Intake/Output Summary (Last 24 hours) at 09/22/2021 1320 ?Last data filed at 09/22/2021 0433 ?Gross per 24 hour  ?Intake 1307.6 ml  ?Output 550 ml  ?Net 757.6 ml  ? ? ?Wt Readings from Last 3 Encounters:  ?09/22/21 73.2 kg  ?09/14/21 82.6 kg  ?09/12/21 82.6 kg  ? ? ?Examination: ? ?Constitutional: NAD ?Eyes: lids and conjunctivae normal, no scleral icterus ?ENMT: mmm ?Neck: normal, supple ?Respiratory: Diffuse bibasilar rhonchi, no wheezing, tachypneic at  times ?Cardiovascular: Regular rate and rhythm, no murmurs / rubs / gallops. No LE edema. ?Abdomen: soft, no distention, no tenderness. Bowel sounds positive.  ?Skin: no rashes ?Neurologic: no focal deficits, equal strength ? ? ?Data Reviewed: I have independently reviewed following labs and imaging studies  ? ?CBC ?Recent Labs  ?Lab 09/18/21 ?11/18/21 09/19/21 ?0310 09/20/21 ?0403 09/21/21 ?11/21/21 09/22/21 ?0430  ?WBC 27.7* 20.6* 20.2* 22.1* 15.8*  ?HGB 9.9* 9.4* 9.0* 9.3* 9.6*  ?HCT 31.6* 28.6* 27.7* 28.3* 28.8*  ?PLT 321 353 405* 454* 490*  ?MCV 95.5 91.7 92.0 91.6 90.9  ?MCH 29.9 30.1 29.9 30.1 30.3  ?MCHC 31.3 32.9 32.5 32.9 33.3  ?RDW 19.1* 18.9* 19.0* 18.7* 18.3*  ?LYMPHSABS  --   --  0.2*  --   --   ?MONOABS  --   --  1.2*  --   --   ?EOSABS  --   --  0.0  --   --   ?BASOSABS  --   --  0.2*  --   --   ? ? ? ?Recent Labs  ?Lab 09/16/21 ?2013 09/16/21 ?  2014 09/16/21 ?2033 09/16/21 ?2234 09/17/21 ?0234 09/18/21 ?2947 09/19/21 ?0310 09/20/21 ?0403 09/21/21 ?6546 09/22/21 ?0430  ?NA 134*  --    < >  --  136 136 134* 133* 134* 133*  ?K 3.6  --    < >  --  3.1* 3.3* 3.3* 3.5 3.9 4.0  ?CL 104  --   --   --  108 107 106 107 106 100  ?CO2 18*  --   --   --  20* 20* 19* 20* 20* 21*  ?GLUCOSE 136*  --   --   --  151* 120* 136* 120* 143* 222*  ?BUN 12  --   --   --  $R'13 12 12 16 12 16  'Rw$ ?CREATININE 1.01  --   --   --  1.01 0.78 0.81 0.71 0.79 0.85  ?CALCIUM 8.0*  --   --   --  7.5* 8.1* 7.7* 7.7* 7.8* 8.4*  ?AST  --   --   --   --   --   --   --   --  66*  --   ?ALT  --   --   --   --   --   --   --   --  79*  --   ?ALKPHOS  --   --   --   --   --   --   --   --  91  --   ?BILITOT  --   --   --   --   --   --   --   --  0.6  --   ?ALBUMIN  --   --   --   --   --   --   --   --  2.1*  --   ?MG 1.9  --   --   --   --  2.0  --   --  1.7 2.0  ?PROCALCITON  --   --   --  0.21 0.31  --   --   --   --   --   ?LATICACIDVEN  --  1.7  --   --   --   --   --   --   --   --   ?BNP 280.3*  --   --   --   --   --   --   --   --   --   ? < > =  values in this interval not displayed.  ? ? ? ?------------------------------------------------------------------------------------------------------------------ ?No results for input(s): CHOL, HDL, LDLCALC, TRIG

## 2021-09-22 NOTE — Progress Notes (Signed)
? ?NAME:  Eric Giangregorio., MRN:  [id number], DOB:  1941-01-25, LOS: 6 ?ADMISSION DATE:  09/16/2021, CONSULTATION DATE:  09/21/21 ?REFERRING MD: Dr. Marzetta Board, CHIEF COMPLAINT: Hypoxemic respiratory failure ? ?History of Present Illness:  ?Eric Beltran. is a 81 y.o. 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 He has had progression of sarcoma with pulmonary metastasis in fall 2022 and currently undergoing gemcitabine docetaxel chemotherapy for this (s/p Cycle 5.)  He had restaging scans last month at [facility] on 09/17/2021 which showed reduction in many of the pulmonary nodules with interval thickening of the interstitial and patchy GGOs. He notes he has been feeling "bad" since 09/16/21 with fatigue and shortness of breath. His pulse oximetery over the weekend at home read 83% on room air which is why his brother encouraged him to come to the hospital. Admitted to [facility] service and was started empirically for CAP coverage. However over the last couple of days has had worsening oxygen requirement and chest xray. CT chest was obtained this morning and PCCM was consulted for further evaluation and possible chemotherapy induced pneumonitis.  ?  ?This morning he says he feels the best he has felt in 3 days and denies dyspnea. Although he has not gotten out of bed since being here. While talking to him he is on 10L hi flow and satting between 93-98% depending on his respiratory effort. He denies coughing or wheezing.  ?  ?Of note he has been on amiodarone for about 3-4 years. He denies ever being smoker but did have passive smoke exposure in both spouses. Sister also a smoker. He has worked in various jobs Economist, being a Psychologist, sport and exercise, Solicitor. Currently lives at home independently and no pets.  ? ?Pertinent  Medical History  ?Atrial fibrillation on eliquis and amiodarone ?CAD s/p CABG ?BPH ?HTN ? ?Significant Hospital Events: ?Including procedures,  antibiotic start and stop dates in addition to other pertinent events   ?CT showing multifocal infiltrate, lung nodules have remained stable ? ?Interim History / Subjective:  ?Weaning oxygen lowered slowly ? ?Objective   ?Blood pressure (!) 115/57, pulse 81, temperature 97.6 ?F (36.4 ?C), temperature source Oral, resp. rate 18, height '5\' 7"'$  (1.702 m), weight 73.2 kg, SpO2 97 %. ?   ?FiO2 (%):  [40 %-60 %] 40 %  ? ?Intake/Output Summary (Last 24 hours) at 09/22/2021 1545 ?Last data filed at 09/22/2021 0433 ?Gross per 24 hour  ?Intake 1307.6 ml  ?Output 550 ml  ?Net 757.6 ml  ? ?Filed Weights  ? 09/20/21 0004 09/21/21 0334 09/22/21 0421  ?Weight: 75.6 kg 76 kg 73.2 kg  ? ? ?Examination: ?General: Elderly, does not appear to be in distress ?HENT: Moist oral mucosa ?Lungs: Decreased air movement bilaterally, no rales ?Cardiovascular: S1-S2 appreciated ?Abdomen: Soft nontender ?Extremities: Right AKA, prosthesis in place ?Neuro: Alert and oriented x3 ?GU: Fair output ? ?Resolved Hospital Problem list   ? ? ?Assessment & Plan:  ?Hypoxemic respiratory failure ?Soft tissue sarcoma with pulmonary metastases on chemotherapy-gemcitabine/docetaxel ? ?Blood culture positive for Staph epidermidis on 5/6 ? ?Atrial fibrillation, on Eliquis, amiodarone on hold ?Coronary artery disease s/p CABG 2013 ? ?Concerns include hypersensitivity pneumonitis, amiodarone pulmonary toxicity, ?Less likely bacterial infection ? ?Bronchoscopy already discussed with the patient-risk would be too high in this present situation ? ?Continue to hold amiodarone ?Continue steroids, may be weaned slowly ?Continue PPI for prophylaxis ? ?PCCM will continue to follow ? ?  Sherrilyn Rist, MD ?[facility] ?Pager: See Amion ? ? ? ? ? ?

## 2021-09-23 DIAGNOSIS — J189 Pneumonia, unspecified organism: Secondary | ICD-10-CM | POA: Diagnosis not present

## 2021-09-23 DIAGNOSIS — J9621 Acute and chronic respiratory failure with hypoxia: Secondary | ICD-10-CM | POA: Diagnosis not present

## 2021-09-23 DIAGNOSIS — C499 Malignant neoplasm of connective and soft tissue, unspecified: Secondary | ICD-10-CM | POA: Diagnosis not present

## 2021-09-23 DIAGNOSIS — T80211D Bloodstream infection due to central venous catheter, subsequent encounter: Secondary | ICD-10-CM | POA: Diagnosis not present

## 2021-09-23 LAB — MAGNESIUM: Magnesium: 1.9 mg/dL (ref 1.7–2.4)

## 2021-09-23 LAB — CBC
HCT: 26.7 % — ABNORMAL LOW (ref 39.0–52.0)
Hemoglobin: 8.7 g/dL — ABNORMAL LOW (ref 13.0–17.0)
MCH: 29.7 pg (ref 26.0–34.0)
MCHC: 32.6 g/dL (ref 30.0–36.0)
MCV: 91.1 fL (ref 80.0–100.0)
Platelets: 498 10*3/uL — ABNORMAL HIGH (ref 150–400)
RBC: 2.93 MIL/uL — ABNORMAL LOW (ref 4.22–5.81)
RDW: 18.1 % — ABNORMAL HIGH (ref 11.5–15.5)
WBC: 30 10*3/uL — ABNORMAL HIGH (ref 4.0–10.5)
nRBC: 0 % (ref 0.0–0.2)

## 2021-09-23 LAB — BASIC METABOLIC PANEL
Anion gap: 12 (ref 5–15)
BUN: 22 mg/dL (ref 8–23)
CO2: 20 mmol/L — ABNORMAL LOW (ref 22–32)
Calcium: 8.5 mg/dL — ABNORMAL LOW (ref 8.9–10.3)
Chloride: 100 mmol/L (ref 98–111)
Creatinine, Ser: 0.85 mg/dL (ref 0.61–1.24)
GFR, Estimated: >60 mL/min (ref >60)
Glucose, Bld: 228 mg/dL — ABNORMAL HIGH (ref 70–99)
Potassium: 4.3 mmol/L (ref 3.5–5.1)
Sodium: 132 mmol/L — ABNORMAL LOW (ref 135–145)

## 2021-09-23 MED ORDER — PREDNISONE 50 MG PO TABS
60.0000 mg | ORAL_TABLET | Freq: Every day | ORAL | Status: DC
  Administered 2021-09-23 – 2021-09-24 (×2): 60 mg via ORAL
  Filled 2021-09-23 (×2): qty 1

## 2021-09-23 MED ORDER — POLYVINYL ALCOHOL 1.4 % OP SOLN
1.0000 [drp] | OPHTHALMIC | Status: DC | PRN
  Administered 2021-09-23: 1 [drp] via OPHTHALMIC
  Filled 2021-09-23: qty 15

## 2021-09-23 NOTE — Progress Notes (Addendum)
? ?NAME:  Eric Beltran., MRN:  [id number], DOB:  [birth year], LOS: 7 ?ADMISSION DATE:  09/16/2021, CONSULTATION DATE:  09/21/21 ?REFERRING MD: Dr. Marzetta Board, CHIEF COMPLAINT: Hypoxemic respiratory failure ? ?History of Present Illness:  ?Eric Beltran. is a 81 y.o. 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 He has had progression of sarcoma with pulmonary metastasis in fall 2022 and currently undergoing gemcitabine docetaxel chemotherapy for this (s/p Cycle 5.)  He had restaging scans last month at [facility] on 09/17/2021 which showed reduction in many of the pulmonary nodules with interval thickening of the interstitial and patchy GGOs. He notes he has been feeling "bad" since 09/16/21 with fatigue and shortness of breath. His pulse oximetery over the weekend at home read 83% on room air which is why his brother encouraged him to come to the hospital. Admitted to [facility] service and was started empirically for CAP coverage. However over the last couple of days has had worsening oxygen requirement and chest xray. CT chest was obtained this morning and PCCM was consulted for further evaluation and possible chemotherapy induced pneumonitis.  ?  ?This morning he says he feels the best he has felt in 3 days and denies dyspnea. Although he has not gotten out of bed since being here. While talking to him he is on 10L hi flow and satting between 93-98% depending on his respiratory effort. He denies coughing or wheezing.  ?  ?Of note he has been on amiodarone for about 3-4 years. He denies ever being smoker but did have passive smoke exposure in both spouses. Sister also a smoker. He has worked in various jobs Economist, being a Psychologist, sport and exercise, Solicitor. Currently lives at home independently and no pets.  ? ?Pertinent  Medical History  ?Atrial fibrillation on eliquis and amiodarone ?CAD s/p CABG ?BPH ?HTN ? ?Significant Hospital Events: ?Including procedures,  antibiotic start and stop dates in addition to other pertinent events   ?CT showing multifocal infiltrate, lung nodules have remained stable ? ?Interim History / Subjective:  ?Weaning well after steroids ?Reports difficulty sleeping ? ?Objective   ?Blood pressure (!) 145/69, pulse 67, temperature (!) 97.3 ?F (36.3 ?C), temperature source Oral, resp. rate 17, height '5\' 7"'$  (1.702 m), weight 74.4 kg, SpO2 98 %. ?   ?FiO2 (%):  [40 %] 40 %  ? ?Intake/Output Summary (Last 24 hours) at 09/23/2021 0907 ?Last data filed at 09/23/2021 0408 ?Gross per 24 hour  ?Intake 605 ml  ?Output 550 ml  ?Net 55 ml  ? ?Filed Weights  ? 09/21/21 0334 09/22/21 0421 09/23/21 0011  ?Weight: 76 kg 73.2 kg 74.4 kg  ? ?Physical Exam: ?General: Elderly, well-appearing, no acute distress ?HENT: [place], AT, OP clear, MMM ?Eyes: EOMI, no scleral icterus ?Respiratory: Diminished but clear to auscultation bilaterally.  No crackles, wheezing or rales ?Cardiovascular: RRR, -M/R/G, no JVD ?GI: BS+, soft, nontender ?Extremities: R AKA, -Edema in LLE,-tenderness ?Neuro: AAO x4, CNII-XII grossly intact ? ?Resolved Hospital Problem list   ? ? ?Assessment & Plan:  ?Hypoxemic respiratory failure - improving ?Soft tissue sarcoma with pulmonary metastases on chemotherapy-gemcitabine/docetaxel ? ?Blood culture positive for Staph epidermidis on 5/6 ? ?Atrial fibrillation, on Eliquis, amiodarone on hold ?Coronary artery disease s/p CABG 2013 ? ?Concerns include hypersensitivity pneumonitis, amiodarone pulmonary toxicity, ?Less likely bacterial infection ? ?Bronchoscopy already discussed with the patient-risk would be too high in this present situation. Now after steroids would likely  be low-yield as well. Will continue to monitor clinical response  ? ?Continue to wean supplemental oxygen for goal SpO2 >92% ?Continue to hold amiodarone ?Wean from solumedrol to prednisone 60 mg. Will likely need slow taper ?Continue PPI for prophylaxis ? ?Updated patient and family at  bedside. Addressed questions and concerns. ?PCCM will continue to follow ? ?Care Time: 35 min ? ?Eric Beltran, M.D. ?[facility] ?09/23/2021 9:07 AM  ? ?See Amion for personal pager ?For hours between 7 PM to 7 AM, please call Elink for urgent questions ? ? ? ? ? ? ?

## 2021-09-23 NOTE — Progress Notes (Signed)
Physical Therapy Treatment ?Patient Details ?Name: Eric Beltran. ?MRN: [id number] ?DOB: 04-03-1941 ?Today's Date: 09/23/2021 ? ? ?History of Present Illness Pt is a 81 y/o male admitted secondary to cough and SOB. Found to have sepsis due to staph hominis and epidermidis bacteremia, and hypoxic respiratory failure from pneumonitis. PMH includes sarcoma s/p R AKA, CAD, pulmonary mets. ? ?  ?PT Comments  ? ? The pt  was able to make good progress with ambulation distance and stability at this time, completed 35 ft + 51f (pt had to adjust prosthetic after initial bout of walking) with SpO2 stable >95% on 3L O2. The pt required minG for safety and line management, but had no overt LOB despite slowed speed and step-to pattern.  ? ?After lengthy discussion about rehab options after d/c, the pt and his family (son-in-law present) state they prefer for the pt to d/c to the daughter's house where the daughter is home all day Wednesday and Friday, and other supervision can be arranged on other week days. The pt does not feel ready yet for d/c, and I agree, but would like to aim for d/c home with family rather than for continued in-patient rehab.  ?  ?Recommendations for follow up therapy are one component of a multi-disciplinary discharge planning process, led by the attending physician.  Recommendations may be updated based on patient status, additional functional criteria and insurance authorization. ? ?Follow Up Recommendations ? Home health PT (at daughter's house) ?  ?  ?Assistance Recommended at Discharge Frequent or constant Supervision/Assistance  ?Patient can return home with the following A little help with walking and/or transfers;A little help with bathing/dressing/bathroom;Assistance with cooking/housework;Help with stairs or ramp for entrance;Assist for transportation ?  ?Equipment Recommendations ? None recommended by PT (has RW)  ?  ?Recommendations for Other Services   ? ? ?  ?Precautions / Restrictions  Precautions ?Precautions: Fall;Other (comment) ?Precaution Comments: R AKA; has prosthetic ?Restrictions ?Weight Bearing Restrictions: No  ?  ? ?Mobility ? Bed Mobility ?Overal bed mobility: Needs Assistance ?  ?  ?  ?  ?  ?  ?General bed mobility comments: pt OOB in recliner at start and end of session ?  ? ?Transfers ?Overall transfer level: Needs assistance ?Equipment used: Rolling walker (2 wheels) ?Transfers: Sit to/from Stand ?Sit to Stand: Min guard ?  ?  ?  ?  ?  ?General transfer comment: minG for safety as pt powered up to standing. cues for RUE hand placement. ?  ? ?Ambulation/Gait ?Ambulation/Gait assistance: Min guard ?Gait Distance (Feet): 35 Feet (+ 80 ft) ?Assistive device: Rolling walker (2 wheels) ?Gait Pattern/deviations: Step-to pattern, Decreased step length - right, Decreased step length - left ?Gait velocity: decreased ?Gait velocity interpretation: <1.31 ft/sec, indicative of household ambulator ?  ?General Gait Details: slow, cautious gait but steady without overt LOB. pt maintains RLE advanced in front of LLE, step-to pattern. pt prostetic making noise, pt able to identify and asked to return to room to make adjustements, then continued with ambulation. SpO2 >95% on 3L with gait ? ? ?  ?Balance Overall balance assessment: Needs assistance ?Sitting-balance support: No upper extremity supported, Feet supported ?Sitting balance-Leahy Scale: Fair ?  ?  ?Standing balance support: Bilateral upper extremity supported ?Standing balance-Leahy Scale: Poor ?Standing balance comment: Reliant on BUE support ?  ?  ?  ?  ?  ?  ?  ?  ?  ?  ?  ?  ? ?  ?Cognition Arousal/Alertness: Awake/alert ?Behavior  During Therapy: [facility] for tasks assessed/performed ?Overall Cognitive Status: Within Functional Limits for tasks assessed ?  ?  ?  ?  ?  ?  ?  ?  ?  ?  ?  ?  ?  ?  ?  ?  ?General Comments: very motivated and appropriate ?  ?  ? ?  ?Exercises   ? ?  ?General Comments General comments (skin integrity, edema,  etc.): VSS on 3L with ambulation, on 2L at rest with SpO2 96-99% ?  ?  ? ?Pertinent Vitals/Pain Pain Assessment ?Pain Assessment: No/denies pain  ? ? ? ?PT Goals (current goals can now be found in the care plan section) Acute Rehab PT Goals ?Patient Stated Goal: to feel better and go home ?PT Goal Formulation: With patient/family ?Time For Goal Achievement: 10/06/21 ?Potential to Achieve Goals: Good ?Progress towards PT goals: Progressing toward goals ? ?  ?Frequency ? ? ? Min 3X/week ? ? ? ?  ?PT Plan Current plan remains appropriate  ? ? ?   ?AM-PAC PT "6 Clicks" Mobility   ?Outcome Measure ? Help needed turning from your back to your side while in a flat bed without using bedrails?: A Little ?Help needed moving from lying on your back to sitting on the side of a flat bed without using bedrails?: A Little ?Help needed moving to and from a bed to a chair (including a wheelchair)?: A Little ?Help needed standing up from a chair using your arms (e.g., wheelchair or bedside chair)?: A Little ?Help needed to walk in hospital room?: A Little ?Help needed climbing 3-5 steps with a railing? : A Lot ?6 Click Score: 17 ? ?  ?End of Session Equipment Utilized During Treatment: Gait belt;Oxygen ?Activity Tolerance: Patient limited by fatigue ?Patient left: in chair;with call bell/phone within reach;with family/visitor present ?Nurse Communication: Mobility status (change in O2) ?PT Visit Diagnosis: Unsteadiness on feet (R26.81);Muscle weakness (generalized) (M62.81) ?  ? ? ?Time: 4008-6761 ?PT Time Calculation (min) (ACUTE ONLY): 32 min ? ?Charges:  $Gait Training: 8-22 mins ?$Therapeutic Exercise: 8-22 mins          ?          ? ?West Carbo, PT, DPT  ? ?Acute Rehabilitation Department ?Pager #: [phone] - [phone] ? ? ?Sandra Cockayne ?09/23/2021, 5:37 PM ? ?

## 2021-09-23 NOTE — Plan of Care (Signed)
  Problem: Activity: Goal: Ability to tolerate increased activity will improve Outcome: Progressing   Problem: Clinical Measurements: Goal: Ability to maintain a body temperature in the normal range will improve Outcome: Progressing   Problem: Respiratory: Goal: Ability to maintain adequate ventilation will improve Outcome: Progressing   Problem: Respiratory: Goal: Ability to maintain a clear airway will improve Outcome: Progressing   

## 2021-09-23 NOTE — Progress Notes (Signed)
?PROGRESS NOTE ? ?Eric Beltran. ZDG:[id number] DOB: Jul 25, 1940 DOA: 09/16/2021 ?PCP: Celene Squibb, MD ? ? LOS: 7 days  ? 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  Patient tells me he has been short of breath for the past 1 to 2 weeks but in the last 3 days prior to arrival he was barely able to walk 10 to 15 feet without getting out of breath.  He was found to have Staphylococcus bacteremia, and chemotherapy port was removed on 5/9.  ID consulted as well ? ?Subjective / 24h Interval events: ?He is feeling very good this morning.  Feels like his breathing is improved significantly ? ?Assesement and Plan: ?Active Problems: ?  Bloodstream infection due to central venous catheter ?  Paroxysmal atrial fibrillation (HCC) ?  Coronary artery disease involving coronary bypass graft of native heart with angina pectoris ([place]) ?  Essential hypertension ?  Sarcoma ([place]) ?  Hypokalemia ?  Acute on chronic respiratory failure with hypoxia (HCC) ?  Pneumonitis ? ?Principal problem ?Sepsis due to Staph hominis and epidermidis bacteremia-met sepsis criteria on admission with fever of 101.4, leukocytosis, tachycardia and the source.  ID consulted and following, appreciate input.  Currently he is on vancomycin.  This was felt to be catheter related bloodstream infection, IR consulted and he is status post port removal 5/9.  Dr. Cathlean Sauer discussed with the patient's primary oncology team at [facility] regarding catheter removal.  Continue vancomycin for a total of 5 days post catheter removal, end date tomorrow, 5/14 ? ?Active problems ?Acute hypoxic respiratory failure due to hypersensitivity pneumonitis-chest x-ray with bilateral pulmonary infiltrates, there was concern initially for infectious process. On outside records  review, patient had a CT scan of the chest on 09/15/2021 which showed some patchy groundglass opacities, concerning for fibrotic reaction to chemotherapy.  Pulmonary consulted, repeat CT scan here concerning for hypersensitivity pneumonitis due to amiodarone versus chemotherapy induced pneumonitis.  He was started on steroids.  This is less likely to be bacterial pneumonia, other than vancomycin for the reasons above hold further antibacterials.  Seems to be getting better, only on 4 L this morning.  Continue to appear to wean off ? ?Acute on chronic diastolic RJJ-8A echo done 5/8 showed EF 65 to 65%, no WMA.  He has grade 1 diastolic dysfunction.  RV was normal.  Euvolemic now, hold further Lasix ? ?Paroxysmal atrial fibrillation ([place]), with RVR-Continue rate control with diltiazem, amiodarone on hold.  Continue Eliquis.  No further RVR episode this morning.  Continue current regimen.  His RVR episodes were likely due to underlying pulmonary issues, also IV diuresis probably contributing, no diuresis stopped.   ?  ?Coronary artery disease involving coronary bypass graft of native heart with angina pectoris (HCC) -No chest pain, continue blood pressure control and antiplatelet therapy with clopidogrel.  ?  ?Essential hypertension -Diuresis with furosemide as tolerated for acute pulmonary edema.  Furosemide now on hold, blood pressure stable ?  ?Sarcoma ([place]) -Sp right AKA. Positive pulmonary metastasis. Undergoing chemotherapy as outpatient with [facility], with Dr. Lendon Collar.  Dr. Cathlean Sauer discussed with cancer center in [place] ? ?Hypokalemia -replete as indicated, continue to monitor ?  ?Hyponatremia-mild, continue to monitor while on diuresis ?  ?Scheduled Meds: ? apixaban  2.5 mg Oral BID  ? atorvastatin  80 mg Oral QHS  ?  clopidogrel  75 mg Oral Q breakfast  ? diltiazem  240 mg Oral Daily  ? pantoprazole  40 mg Oral Daily  ? predniSONE  60 mg Oral Q breakfast  ? sodium chloride flush  3 mL Intravenous Q12H  ? tamsulosin   0.4 mg Oral QHS  ? ?Continuous Infusions: ? sodium chloride    ? vancomycin 1,000 mg (09/23/21 0526)  ? ?PRN Meds:.acetaminophen **OR** acetaminophen, albuterol, lip balm, metoprolol tartrate, ondansetron **OR** ondansetron (ZOFRAN) IV, oxyCODONE, polyvinyl alcohol, senna-docusate, sodium chloride ? ?Diet Orders (From admission, onward)  ? ?  Start     Ordered  ? 09/17/21 0003  Diet Heart Room service appropriate? Yes; Fluid consistency: Thin  Diet effective now       ?Question Answer Comment  ?Room service appropriate? Yes   ?Fluid consistency: Thin   ?  ? 09/17/21 0003  ? ?  ?  ? ?  ? ? ?DVT prophylaxis: apixaban (ELIQUIS) tablet 2.5 mg Start: 09/17/21 0015 ?apixaban (ELIQUIS) tablet 2.5 mg  ? ?Lab Results  ?Component Value Date  ? PLT 498 (H) 09/23/2021  ? ? ?  Code Status: Full Code ? ?Family Communication: Son was present at bedside ? ?Status is: Inpatient ? ?Remains inpatient appropriate because: Severity of illness ? ?Level of care: Progressive ? ?Consultants:  ?ID ?IR ? ?Procedures:  ?2d echo ? ?Objective: ?Vitals:  ? 09/22/21 2000 09/23/21 0011 09/23/21 0405 09/23/21 3192  ?BP:  139/76 (!) 145/69   ?Pulse: 80 70 67   ?Resp: 17 15 17    ?Temp:  97.7 ?F (36.5 ?C) (!) 97.3 ?F (36.3 ?C)   ?TempSrc:  Oral Oral   ?SpO2: 98% 100%  98%  ?Weight:  74.4 kg    ?Height:      ? ? ?Intake/Output Summary (Last 24 hours) at 09/23/2021 1358 ?Last data filed at 09/23/2021 0408 ?Gross per 24 hour  ?Intake 365 ml  ?Output 550 ml  ?Net -185 ml  ? ? ?Wt Readings from Last 3 Encounters:  ?09/23/21 74.4 kg  ?09/14/21 82.6 kg  ?09/12/21 82.6 kg  ? ? ?Examination: ? ?Constitutional: NAD ?Eyes: lids and conjunctivae normal, no scleral icterus ?ENMT: mmm ?Neck: normal, supple ?Respiratory: clear to auscultation bilaterally, no wheezing, no crackles. Normal respiratory effort.  ?Cardiovascular: Regular rate and rhythm, no murmurs / rubs / gallops. No LE edema. ?Abdomen: soft, no distention, no tenderness. Bowel sounds positive.  ?Skin: no  rashes ?Neurologic: no focal deficits, equal strength ? ? ?Data Reviewed: I have independently reviewed following labs and imaging studies  ? ?CBC ?Recent Labs  ?Lab 09/19/21 ?0310 09/20/21 ?0403 09/21/21 ?0322 09/22/21 ?0430 09/23/21 ?09/25/21  ?WBC 20.6* 20.2* 22.1* 15.8* 30.0*  ?HGB 9.4* 9.0* 9.3* 9.6* 8.7*  ?HCT 28.6* 27.7* 28.3* 28.8* 26.7*  ?PLT 353 405* 454* 490* 498*  ?MCV 91.7 92.0 91.6 90.9 91.1  ?MCH 30.1 29.9 30.1 30.3 29.7  ?MCHC 32.9 32.5 32.9 33.3 32.6  ?RDW 18.9* 19.0* 18.7* 18.3* 18.1*  ?LYMPHSABS  --  0.2*  --   --   --   ?MONOABS  --  1.2*  --   --   --   ?EOSABS  --  0.0  --   --   --   ?BASOSABS  --  0.2*  --   --   --   ? ? ? ?Recent Labs  ?Lab 09/16/21 ?2013 09/16/21 ?2014 09/16/21 ?2033 09/16/21 ?2234 09/17/21 ?0234 09/18/21 ?11/18/21 09/19/21 ?0310 09/20/21 ?0403 09/21/21 ?11/21/21 09/22/21 ?0430 09/23/21 ?09/25/21  ?  NA 134*  --    < >  --  136 136 134* 133* 134* 133* 132*  ?K 3.6  --    < >  --  3.1* 3.3* 3.3* 3.5 3.9 4.0 4.3  ?CL 104  --   --   --  108 107 106 107 106 100 100  ?CO2 18*  --   --   --  20* 20* 19* 20* 20* 21* 20*  ?GLUCOSE 136*  --   --   --  151* 120* 136* 120* 143* 222* 228*  ?BUN 12  --   --   --  $R'13 12 12 16 12 16 22  'nZ$ ?CREATININE 1.01  --   --   --  1.01 0.78 0.81 0.71 0.79 0.85 0.85  ?CALCIUM 8.0*  --   --   --  7.5* 8.1* 7.7* 7.7* 7.8* 8.4* 8.5*  ?AST  --   --   --   --   --   --   --   --  83*  --   --   ?ALT  --   --   --   --   --   --   --   --  56*  --   --   ?ALKPHOS  --   --   --   --   --   --   --   --  42  --   --   ?BILITOT  --   --   --   --   --   --   --   --  0.6  --   --   ?ALBUMIN  --   --   --   --   --   --   --   --  2.1*  --   --   ?MG 1.9  --   --   --   --  2.0  --   --  1.7 2.0 1.9  ?PROCALCITON  --   --   --  0.21 0.31  --   --   --   --   --   --   ?LATICACIDVEN  --  1.7  --   --   --   --   --   --   --   --   --   ?BNP 280.3*  --   --   --   --   --   --   --   --   --   --   ? < > = values in this interval not displayed.   ? ? ? ?------------------------------------------------------------------------------------------------------------------ ?No results for input(s): CHOL, HDL, LDLCALC, TRIG, CHOLHDL, LDLDIRECT in the last 72 hours. ? ?Lab Results  ?Compo

## 2021-09-23 NOTE — Evaluation (Signed)
Occupational Therapy Evaluation ?Patient Details ?Name: Eric Beltran. ?MRN: [id number] ?DOB: 1940/12/20 ?Today's Date: 09/23/2021 ? ? ?History of Present Illness Pt is a 81 y/o male admitted secondary to cough and SOB. Found to have Sepsis due to Staph hominis and epidermidis bacteremia, and hypoxic respiratory failure from pneumonitis. PMH includes sarcoma s/p R AKA, CAD, pulmonary mets.  ? ?Clinical Impression ?  ?Prior to this admission patient was living home alone and still driving and managing all of his IADLs. Currently, patient is requiring 4L HFNC to maintain O2 saturations, min-mod A for lower body ADLs, and min A for transfers. Patient is able to ambulate minimal distance, but needs seated rest breaks due to fatigue (increased RR with functional mobility). Recommending AIR consult due to previous level of function and excellent family support and motivation. OT will continue to follow acutely.  ?   ? ?Recommendations for follow up therapy are one component of a multi-disciplinary discharge planning process, led by the attending physician.  Recommendations may be updated based on patient status, additional functional criteria and insurance authorization.  ? ?Follow Up Recommendations ? Acute inpatient rehab (3hours/day)  ?  ?Assistance Recommended at Discharge Intermittent Supervision/Assistance  ?Patient can return home with the following A little help with walking and/or transfers;A little help with bathing/dressing/bathroom;Assistance with cooking/housework;Assist for transportation;Help with stairs or ramp for entrance ? ?  ?Functional Status Assessment ? Patient has had a recent decline in their functional status and demonstrates the ability to make significant improvements in function in a reasonable and predictable amount of time.  ?Equipment Recommendations ? Other (comment) (Will continue to assess)  ?  ?Recommendations for Other Services Rehab consult ? ? ?  ?Precautions / Restrictions  Precautions ?Precautions: Fall;Other (comment) ?Precaution Comments: R AKA; has prosthetic ?Restrictions ?Weight Bearing Restrictions: No  ? ?  ? ?Mobility Bed Mobility ?Overal bed mobility: Needs Assistance ?Bed Mobility: Supine to Sit ?  ?  ?Supine to sit: Min assist ?  ?  ?General bed mobility comments: Min A for LE assist ?  ? ?Transfers ?Overall transfer level: Needs assistance ?Equipment used: Rolling walker (2 wheels) ?Transfers: Sit to/from Stand ?Sit to Stand: Min assist ?  ?  ?  ?  ?  ?General transfer comment: Min A for lift assist and steadying. ?  ? ?  ?Balance Overall balance assessment: Needs assistance ?Sitting-balance support: No upper extremity supported, Feet supported ?Sitting balance-Leahy Scale: Fair ?  ?  ?Standing balance support: Bilateral upper extremity supported ?Standing balance-Leahy Scale: Poor ?Standing balance comment: Reliant on BUE support ?  ?  ?  ?  ?  ?  ?  ?  ?  ?  ?  ?   ? ?ADL either performed or assessed with clinical judgement  ? ?ADL Overall ADL's : Needs assistance/impaired ?Eating/Feeding: Set up;Sitting ?  ?Grooming: Set up;Sitting ?  ?Upper Body Bathing: Set up;Sitting ?  ?Lower Body Bathing: Minimal assistance;Sitting/lateral leans;Sit to/from stand;Moderate assistance ?  ?Upper Body Dressing : Set up;Sitting ?  ?Lower Body Dressing: Minimal assistance;Moderate assistance;Sitting/lateral leans;Sit to/from stand ?  ?Toilet Transfer: Minimal assistance;Ambulation;Rolling walker (2 wheels) ?  ?Toileting- Clothing Manipulation and Hygiene: Min guard;Sitting/lateral lean;Sit to/from stand ?  ?  ?  ?Functional mobility during ADLs: Minimal assistance;Rolling walker (2 wheels) ?General ADL Comments: Patient presenting with decreased activity tolerance and need for 4L HFNC  ? ? ? ?Vision Baseline Vision/History: 0 No visual deficits ?Ability to See in Adequate Light: 0 Adequate ?Patient Visual Report: No  change from baseline ?   ?   ?Perception   ?  ?Praxis   ?  ? ?Pertinent  Vitals/Pain Pain Assessment ?Pain Assessment: No/denies pain  ? ? ? ?Hand Dominance   ?  ?Extremity/Trunk Assessment Upper Extremity Assessment ?Upper Extremity Assessment: Generalized weakness ?  ?Lower Extremity Assessment ?Lower Extremity Assessment: Defer to PT evaluation ?  ?Cervical / Trunk Assessment ?Cervical / Trunk Assessment: Normal ?  ?Communication Communication ?Communication: No difficulties ?  ?Cognition Arousal/Alertness: Awake/alert ?Behavior During Therapy: [facility] for tasks assessed/performed ?Overall Cognitive Status: Within Functional Limits for tasks assessed ?  ?  ?  ?  ?  ?  ?  ?  ?  ?  ?  ?  ?  ?  ?  ?  ?General Comments: very motivated and appropriate ?  ?  ?General Comments  VSS on 4L with ambulation, increased RR but reversing with seated rest break ? ?  ?Exercises   ?  ?Shoulder Instructions    ? ? ?Home Living Family/patient expects to be discharged to:: Private residence ?Living Arrangements: Alone ?Available Help at Discharge: Family ?Type of Home: House ?Home Access: Stairs to enter ?Entrance Stairs-Number of Steps: 3 ?Entrance Stairs-Rails: Left ?Home Layout: One level ?  ?  ?  ?  ?  ?  ?  ?Home Equipment: Conservation officer, nature (2 wheels) ?  ?  ?  ? ?  ?Prior Functioning/Environment Prior Level of Function : Independent/Modified Independent;Driving ?  ?  ?  ?  ?  ?  ?Mobility Comments: Uses RW for mobility tasks ?ADLs Comments: Independent and still drives ?  ? ?  ?  ?OT Problem List: Decreased strength;Decreased activity tolerance;Impaired balance (sitting and/or standing);Decreased coordination;Decreased safety awareness;Decreased knowledge of precautions;Decreased knowledge of use of DME or AE;Cardiopulmonary status limiting activity ?  ?   ?OT Treatment/Interventions: Self-care/ADL training;Therapeutic exercise;Energy conservation;DME and/or AE instruction;Therapeutic activities;Patient/family education;Balance training;Manual therapy  ?  ?OT Goals(Current goals can be found in the care  plan section) Acute Rehab OT Goals ?Patient Stated Goal: to get stronger and go home ?OT Goal Formulation: With patient/family ?Time For Goal Achievement: 10/07/21 ?Potential to Achieve Goals: Good ?ADL Goals ?Pt Will Perform Lower Body Bathing: sitting/lateral leans;sit to/from stand;with set-up;with adaptive equipment ?Pt Will Perform Lower Body Dressing: with set-up;with adaptive equipment;sitting/lateral leans;sit to/from stand ?Pt Will Transfer to Toilet: with supervision;ambulating ?Pt/caregiver will Perform Home Exercise Program: Increased ROM;Increased strength;Both right and left upper extremity;With written HEP provided;With Supervision ?Additional ADL Goal #1: Patient will demonstrate increased activity tolerance by being able to complete functional task in standing for 3-5 minutes without need for seated rest break.  ?OT Frequency: Min 2X/week ?  ? ?Co-evaluation   ?  ?  ?  ?  ? ?  ?AM-PAC OT "6 Clicks" Daily Activity     ?Outcome Measure Help from another person eating meals?: A Little ?Help from another person taking care of personal grooming?: A Little ?Help from another person toileting, which includes using toliet, bedpan, or urinal?: A Little ?Help from another person bathing (including washing, rinsing, drying)?: A Little ?Help from another person to put on and taking off regular upper body clothing?: A Little ?Help from another person to put on and taking off regular lower body clothing?: A Little ?6 Click Score: 18 ?  ?End of Session Equipment Utilized During Treatment: Gait belt;Rolling walker (2 wheels);Oxygen ?Nurse Communication: Mobility status ? ?Activity Tolerance: Patient tolerated treatment well ?Patient left: in chair;with call bell/phone within reach;with family/visitor present ? ?  OT Visit Diagnosis: Unsteadiness on feet (R26.81);Other abnormalities of gait and mobility (R26.89);Muscle weakness (generalized) (M62.81)  ?              ?Time: 2244-9753 ?OT Time Calculation (min): 30  min ?Charges:  OT General Charges ?$OT Visit: 1 Visit ?OT Evaluation ?$OT Eval Moderate Complexity: 1 Mod ?OT Treatments ?$Self Care/Home Management : 8-22 mins ? ?Corinne Ports E. Mitsuru Dault, OTR/L ?Acute Rehabilitation S

## 2021-09-24 DIAGNOSIS — J9621 Acute and chronic respiratory failure with hypoxia: Secondary | ICD-10-CM | POA: Diagnosis not present

## 2021-09-24 DIAGNOSIS — J189 Pneumonia, unspecified organism: Secondary | ICD-10-CM | POA: Diagnosis not present

## 2021-09-24 LAB — GLUCOSE, CAPILLARY
Glucose-Capillary: 270 mg/dL — ABNORMAL HIGH (ref 70–99)
Glucose-Capillary: 336 mg/dL — ABNORMAL HIGH (ref 70–99)
Glucose-Capillary: 329 mg/dL — ABNORMAL HIGH (ref 70–99)

## 2021-09-24 LAB — BASIC METABOLIC PANEL
Anion gap: 10 (ref 5–15)
BUN: 19 mg/dL (ref 8–23)
CO2: 24 mmol/L (ref 22–32)
Calcium: 8.5 mg/dL — ABNORMAL LOW (ref 8.9–10.3)
Chloride: 101 mmol/L (ref 98–111)
Creatinine, Ser: 0.85 mg/dL (ref 0.61–1.24)
GFR, Estimated: >60 mL/min (ref >60)
Glucose, Bld: 229 mg/dL — ABNORMAL HIGH (ref 70–99)
Potassium: 4.1 mmol/L (ref 3.5–5.1)
Sodium: 135 mmol/L (ref 135–145)

## 2021-09-24 LAB — CBC
HCT: 25.4 % — ABNORMAL LOW (ref 39.0–52.0)
Hemoglobin: 8.3 g/dL — ABNORMAL LOW (ref 13.0–17.0)
MCH: 29.6 pg (ref 26.0–34.0)
MCHC: 32.7 g/dL (ref 30.0–36.0)
MCV: 90.7 fL (ref 80.0–100.0)
Platelets: 494 10*3/uL — ABNORMAL HIGH (ref 150–400)
RBC: 2.8 MIL/uL — ABNORMAL LOW (ref 4.22–5.81)
RDW: 18 % — ABNORMAL HIGH (ref 11.5–15.5)
WBC: 28.6 10*3/uL — ABNORMAL HIGH (ref 4.0–10.5)
nRBC: 0 % (ref 0.0–0.2)

## 2021-09-24 MED ORDER — INSULIN ASPART 100 UNIT/ML IJ SOLN
0.0000 [IU] | Freq: Every day | INTRAMUSCULAR | Status: DC
  Administered 2021-09-24: 4 [IU] via SUBCUTANEOUS

## 2021-09-24 MED ORDER — INSULIN ASPART 100 UNIT/ML IJ SOLN
0.0000 [IU] | Freq: Three times a day (TID) | INTRAMUSCULAR | Status: DC
  Administered 2021-09-24 – 2021-09-25 (×2): 7 [IU] via SUBCUTANEOUS
  Administered 2021-09-25: 2 [IU] via SUBCUTANEOUS

## 2021-09-24 MED ORDER — PREDNISONE 50 MG PO TABS
60.0000 mg | ORAL_TABLET | Freq: Every day | ORAL | Status: DC
  Administered 2021-09-25: 60 mg via ORAL
  Filled 2021-09-24: qty 1

## 2021-09-24 MED ORDER — PREDNISONE 20 MG PO TABS: 20.0000 mg | ORAL_TABLET | Freq: Every day | ORAL | Status: DC

## 2021-09-24 MED ORDER — PREDNISONE 50 MG PO TABS: 50.0000 mg | ORAL_TABLET | Freq: Every day | ORAL | Status: DC

## 2021-09-24 MED ORDER — PREDNISONE 20 MG PO TABS: 40.0000 mg | ORAL_TABLET | Freq: Every day | ORAL | Status: DC

## 2021-09-24 MED ORDER — PREDNISONE 20 MG PO TABS: 30.0000 mg | ORAL_TABLET | Freq: Every day | ORAL | Status: DC

## 2021-09-24 NOTE — Progress Notes (Signed)
Inpatient Rehab Admissions Coordinator:  ? ?Per therapy recommendations, patient was screened for CIR candidacy by Clemens Catholic, MS, CCC-SLP. At this time, Pt. does not appear to demonstrate medical necessity to justify a case with patient's payor for  in hospital rehabilitation/CIR. will not pursue a rehab consult for this Pt.   Recommend other rehab venues to be pursued.  Please contact me with any questions. ? ?Clemens Catholic, MS, CCC-SLP ?Rehab Admissions Coordinator  ?[phone] (celll) ?[phone] (office) ? ? ?

## 2021-09-24 NOTE — Plan of Care (Signed)
  Problem: Activity: Goal: Ability to tolerate increased activity will improve Outcome: Progressing   Problem: Clinical Measurements: Goal: Ability to maintain a body temperature in the normal range will improve Outcome: Progressing   Problem: Respiratory: Goal: Ability to maintain adequate ventilation will improve Outcome: Progressing   Problem: Respiratory: Goal: Ability to maintain a clear airway will improve Outcome: Progressing   

## 2021-09-24 NOTE — Progress Notes (Signed)
? ?NAME:  Eric Beltran., MRN:  [id number], DOB:  1940/10/09, LOS: 8 ?ADMISSION DATE:  09/16/2021, CONSULTATION DATE:  09/21/21 ?REFERRING MD: Dr. Marzetta Board, CHIEF COMPLAINT: Hypoxemic respiratory failure ? ?History of Present Illness:  ?[name]. is a 81 y.o. 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 He has had progression of sarcoma with pulmonary metastasis in fall 2022 and currently undergoing gemcitabine docetaxel chemotherapy for this (s/p Cycle 5.)  He had restaging scans last month at [facility] on 09/17/2021 which showed reduction in many of the pulmonary nodules with interval thickening of the interstitial and patchy GGOs. He notes he has been feeling "bad" since 09/16/21 with fatigue and shortness of breath. His pulse oximetery over the weekend at home read 83% on room air which is why his brother encouraged him to come to the hospital. Admitted to [facility] service and was started empirically for CAP coverage. However over the last couple of days has had worsening oxygen requirement and chest xray. CT chest was obtained this morning and PCCM was consulted for further evaluation and possible chemotherapy induced pneumonitis.  ?  ?This morning he says he feels the best he has felt in 3 days and denies dyspnea. Although he has not gotten out of bed since being here. While talking to him he is on 10L hi flow and satting between 93-98% depending on his respiratory effort. He denies coughing or wheezing.  ?  ?Of note he has been on amiodarone for about 3-4 years. He denies ever being smoker but did have passive smoke exposure in both spouses. Sister also a smoker. He has worked in various jobs Economist, being a Psychologist, sport and exercise, Solicitor. Currently lives at home independently and no pets.  ? ?Pertinent  Medical History  ?Atrial fibrillation on eliquis and amiodarone ?CAD s/p CABG ?BPH ?HTN ? ?Significant Hospital Events: ?Including procedures,  antibiotic start and stop dates in addition to other pertinent events   ?CT showing multifocal infiltrate, lung nodules have remained stable ? ?Interim History / Subjective:  ?Oxygen weaned to 1L ? ?Objective   ?Blood pressure (!) 149/73, pulse 72, temperature (!) 97.4 ?F (36.3 ?C), temperature source Oral, resp. rate 20, height '5\' 7"'$  (1.702 m), weight 75 kg, SpO2 96 %. ?   ?   ? ?Intake/Output Summary (Last 24 hours) at 09/24/2021 0854 ?Last data filed at 09/24/2021 0500 ?Gross per 24 hour  ?Intake --  ?Output 2300 ml  ?Net -2300 ml  ? ?Filed Weights  ? 09/22/21 0421 09/23/21 0011 09/24/21 0515  ?Weight: 73.2 kg 74.4 kg 75 kg  ? ?Physical Exam: ?General: Well-appearing, no acute distress ?HENT: [place], AT, OP clear, MMM ?Eyes: EOMI, no scleral icterus ?Respiratory: Clear to auscultation bilaterally.  No crackles, wheezing or rales ?Cardiovascular: RRR, -M/R/G, no JVD ?GI: BS+, soft, nontender ?Extremities: R AKA, -Edema in LLE,-tenderness ?Neuro: AAO x4, CNII-XII grossly intact ? ?Resolved Hospital Problem list   ? ? ?Assessment & Plan:  ?Acute hypoxemic respiratory failure  secondary to presumed amiodarone toxicity vs hypersensitivity pneumonitis- improving ?Soft tissue sarcoma with pulmonary metastases on chemotherapy-gemcitabine/docetaxel ? ?Blood culture positive for Staph epidermidis on 5/6 ? ?Atrial fibrillation, on Eliquis, amiodarone on hold ?Coronary artery disease s/p CABG 2013 ? ?Bronchoscopy already discussed with the patient-risk would be too high in this present situation. Now after steroids would likely be low-yield as well. Will continue to monitor clinical response  ? ?Adverse effects of  steroids discussed including bruising, weight gain, fluid retention, Cushingoid features, hypertension, cardiac arrhythmias, osteoporosis, gastric ulcers, increased risk of infection, insomnia and hyperglycemia. ? ?Plan ?Continue to wean supplemental oxygen for goal SpO2 >92% ?Continue to hold amiodarone ?Continue  prednisone 60 mg. Wean by 10 mg every week. Will need to taper over 3-6 months ?Continue PPI for prophylaxis at discharge ? ?Updated patient at bedside. Addressed questions and concerns. Will arrange outpatient follow-up in 4 weeks. Patient should be on prednisone 30 mg or 20 mg dosing at that time. ? ?PCCM available as needed. ? ?Care Time: 35 min ? ?Rodman Pickle, M.D. ?[facility] ?09/24/2021 8:54 AM  ? ?See Amion for personal pager ?For hours between 7 PM to 7 AM, please call Elink for urgent questions ? ? ? ? ?

## 2021-09-24 NOTE — Progress Notes (Signed)
?PROGRESS NOTE ? ?Eric Beltran. DGL:[id number] DOB: Jan 15, 1941 DOA: 09/16/2021 ?PCP: Celene Squibb, MD ? ? LOS: 8 days  ? 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  Patient tells me he has been short of breath for the past 1 to 2 weeks but in the last 3 days prior to arrival he was barely able to walk 10 to 15 feet without getting out of breath.  He was found to have Staphylococcus bacteremia, and chemotherapy port was removed on 5/9.  ID consulted as well ? ?Subjective / 24h Interval events: ?No complaints, feeling a whole lot better.  Walked yesterday in the hallway and felt good ? ?Assesement and Plan: ?Active Problems: ?  Bloodstream infection due to central venous catheter ?  Paroxysmal atrial fibrillation (HCC) ?  Coronary artery disease involving coronary bypass graft of native heart with angina pectoris ([place]) ?  Essential hypertension ?  Sarcoma ([place]) ?  Hypokalemia ?  Acute on chronic respiratory failure with hypoxia (HCC) ?  Pneumonitis ? ?Principal problem ?Sepsis due to Staph hominis and epidermidis bacteremia-met sepsis criteria on admission with fever of 101.4, leukocytosis, tachycardia and the source.  ID consulted and following, appreciate input.  Currently he is on vancomycin.  This was felt to be catheter related bloodstream infection, IR consulted and he is status post port removal 5/9.  Dr. Cathlean Sauer discussed with the patient's primary oncology team at [facility] regarding catheter removal.  Continue vancomycin for a total of 5 days post catheter removal, end date today 5/14.  Leukocytosis noted, likely due to steroids ? ?Active problems ?Acute hypoxic respiratory failure due to hypersensitivity pneumonitis-chest x-ray with bilateral pulmonary infiltrates, there was concern initially for  infectious process. On outside records review, patient had a CT scan of the chest on 09/15/2021 which showed some patchy groundglass opacities, concerning for fibrotic reaction to chemotherapy.  Pulmonary consulted, repeat CT scan here concerning for hypersensitivity pneumonitis due to amiodarone versus chemotherapy induced pneumonitis.  He was started on steroids.  This is less likely to be bacterial pneumonia, other than vancomycin for the reasons above hold further antibacterials.  He continues to get better and at rest he is tolerating room air.  We will ambulate today and see if he needs any oxygen with activities ? ?Acute on chronic diastolic JJO-8C echo done 5/8 showed EF 65 to 65%, no WMA.  He has grade 1 diastolic dysfunction.  RV was normal.  Remains euvolemic ? ?Paroxysmal atrial fibrillation ([place]), with RVR-Continue rate control with diltiazem, amiodarone on hold.  Continue Eliquis.  No further RVR episode this morning.  Continue current regimen.  His RVR episodes were likely due to underlying pulmonary issues, also IV diuresis probably contributing, no diuresis stopped.   ?  ?Coronary artery disease involving coronary bypass graft of native heart with angina pectoris (HCC) -No chest pain, continue blood pressure control and antiplatelet therapy with clopidogrel.  ?  ?Essential hypertension -blood pressure stable ?  ?Sarcoma ([place]) -Sp right AKA. Positive pulmonary metastasis. Undergoing chemotherapy as outpatient with [facility], with Dr. Lendon Collar.  Dr. Cathlean Sauer discussed with cancer center in [place].  May need his chemotherapy changed given pneumonitis ? ?Hypokalemia -replete as indicated, continue to monitor ?  ?Hyponatremia-sodium now normalized ?  ?Scheduled Meds: ? apixaban  2.5 mg Oral BID  ? atorvastatin  80 mg Oral QHS  ? clopidogrel  75 mg Oral Q breakfast  ? diltiazem  240 mg Oral Daily  ? insulin aspart  0-5 Units Subcutaneous QHS  ? insulin aspart  0-9 Units Subcutaneous TID WC  ? pantoprazole  40 mg  Oral Daily  ? [START ON 09/25/2021] predniSONE  60 mg Oral Q breakfast  ? Followed by  ? Derrill Memo ON 10/01/2021] predniSONE  50 mg Oral Q breakfast  ? Followed by  ? Derrill Memo ON 10/07/2021] predniSONE  40 mg Oral Q breakfast  ? Followed by  ? [START ON 10/13/2021] predniSONE  30 mg Oral Q breakfast  ? Followed by  ? [START ON 10/19/2021] predniSONE  20 mg Oral Q breakfast  ? sodium chloride flush  3 mL Intravenous Q12H  ? tamsulosin  0.4 mg Oral QHS  ? ?Continuous Infusions: ? sodium chloride    ? vancomycin 1,000 mg (09/24/21 0520)  ? ?PRN Meds:.acetaminophen **OR** acetaminophen, albuterol, lip balm, metoprolol tartrate, ondansetron **OR** ondansetron (ZOFRAN) IV, oxyCODONE, polyvinyl alcohol, senna-docusate, sodium chloride ? ?Diet Orders (From admission, onward)  ? ?  Start     Ordered  ? 09/17/21 0003  Diet Heart Room service appropriate? Yes; Fluid consistency: Thin  Diet effective now       ?Question Answer Comment  ?Room service appropriate? Yes   ?Fluid consistency: Thin   ?  ? 09/17/21 0003  ? ?  ?  ? ?  ? ? ?DVT prophylaxis: apixaban (ELIQUIS) tablet 2.5 mg Start: 09/17/21 0015 ?apixaban (ELIQUIS) tablet 2.5 mg  ? ?Lab Results  ?Component Value Date  ? PLT 494 (H) 09/24/2021  ? ? ?  Code Status: Full Code ? ?Family Communication: Son was present at bedside ? ?Status is: Inpatient ? ?Remains inpatient appropriate because: Severity of illness ? ?Level of care: Progressive ? ?Consultants:  ?ID ?IR ? ?Procedures:  ?2d echo ? ?Objective: ?Vitals:  ? 09/23/21 1927 09/23/21 1945 09/24/21 0515 09/24/21 0806  ?BP:  120/78 137/68 (!) 149/73  ?Pulse:  77 63 72  ?Resp:  _0 ?Temp:  98.6 ?F (37 ?C) 97.8 ?F (36.6 ?C) (!) 97.4 ?F (36.3 ?C)  ?TempSrc:  Oral Oral Oral  ?SpO2: 92% 93% 96% 96%  ?Weight:   75 kg   ?Height:      ? ? ?Intake/Output Summary (Last 24 hours) at 09/24/2021 1134 ?Last data filed at 09/24/2021 1023 ?Gross per 24 hour  ?Intake 3 ml  ?Output 2300 ml  ?Net -2297 ml  ? ? ?Wt Readings from Last 3 Encounters:   ?09/24/21 75 kg  ?09/14/21 82.6 kg  ?09/12/21 82.6 kg  ? ? ?Examination: ? ?Constitutional: NAD ?Eyes: lids and conjunctivae normal, no scleral icterus ?ENMT: mmm ?Neck: normal, supple ?Respiratory: clear to auscultation bilaterally, no wheezing, no crackles. Normal respiratory effort.  ?Cardiovascular: Regular rate and rhythm, no murmurs / rubs / gallops. No LE edema. ?Abdomen: soft, no distention, no tenderness. Bowel sounds positive.  ?Skin: no rashes ?Neurologic: no focal deficits, equal strength ? ? ?Data Reviewed: I have independently reviewed following labs and imaging studies  ? ?CBC ?Recent Labs  ?Lab 09/20/21 ?0403 09/21/21 ?0322 09/22/21 ?0430 09/23/21 ?1610 09/24/21 ?0405  ?WBC 20.2* 22.1* 15.8* 30.0* 28.6*  ?HGB 9.0* 9.3* 9.6* 8.7* 8.3*  ?HCT 27.7* 28.3* 28.8* 26.7* 25.4*  ?PLT 405* 454* 490* 498* 494*  ?MCV 92.0 91.6 90.9 91.1 90.7  ?MCH 29.9 30.1 30.3 29.7 29.6  ?MCHC 32.5 32.9 33.3 32.6 32.7  ?RDW  19.0* 18.7* 18.3* 18.1* 18.0*  ?LYMPHSABS 0.2*  --   --   --   --   ?MONOABS 1.2*  --   --   --   --   ?EOSABS 0.0  --   --   --   --   ?BASOSABS 0.2*  --   --   --   --   ? ? ? ?Recent Labs  ?Lab 09/18/21 ?9432 09/19/21 ?0310 09/20/21 ?0403 09/21/21 ?7614 09/22/21 ?0430 09/23/21 ?7092 09/24/21 ?0405  ?NA 136   < > 133* 134* 133* 132* 135  ?K 3.3*   < > 3.5 3.9 4.0 4.3 4.1  ?CL 107   < > 107 106 100 100 101  ?CO2 20*   < > 20* 20* 21* 20* 24  ?GLUCOSE 120*   < > 120* 143* 222* 228* 229*  ?BUN 12   < > _0 ?CREATININE 0.78   < > 0.71 0.79 0.85 0.85 0.85  ?CALCIUM 8.1*   < > 7.7* 7.8* 8.4* 8.5* 8.5*  ?AST  --   --   --  6*  --   --   --   ?ALT  --   --   --  45*  --   --   --   ?ALKPHOS  --   --   --  66  --   --   --   ?BILITOT  --   --   --  0.6  --   --   --   ?ALBUMIN  --   --   --  2.1*  --   --   --   ?MG 2.0  --   --  1.7 2.0 1.9  --   ? < > = values in this interval not displayed.   ? ? ? ?------------------------------------------------------------------------------------------------------------------ ?No results for input(s): CHOL, HDL, LDLCALC, TRIG, CHOLHDL, LDLDIRECT in the last 72 hours. ? ?Lab Results  ?Component Value Date  ? HGBA1C 7.1 (H) 07/26/2021  ? ?--------------------------

## 2021-09-24 NOTE — Progress Notes (Signed)
Mobility Specialist Progress Note  ? ? 09/24/21 1336  ?Mobility  ?Activity Ambulated with assistance to bathroom  ?Level of Assistance Standby assist, set-up cues, supervision of patient - no hands on  ?Assistive Device Front wheel walker  ?Distance Ambulated (ft) 25 ft ?(15+10)  ?Activity Response Tolerated well  ?$Mobility charge 1 Mobility  ? ?Pt received in bed and agreeable. No complaints. Had BM. Returned to chair with call bell in reach. Activity completed on RA and maintained SpO2 >/= 90%.  ? ?Eric Beltran ?Mobility Specialist  ?Primary: 5N M.S. Phone: [phone] ?Secondary: 6N M.S. Phone: [phone] ?  ?

## 2021-09-25 DIAGNOSIS — T80211D Bloodstream infection due to central venous catheter, subsequent encounter: Secondary | ICD-10-CM | POA: Diagnosis not present

## 2021-09-25 DIAGNOSIS — J9621 Acute and chronic respiratory failure with hypoxia: Secondary | ICD-10-CM | POA: Diagnosis not present

## 2021-09-25 DIAGNOSIS — J189 Pneumonia, unspecified organism: Secondary | ICD-10-CM | POA: Diagnosis not present

## 2021-09-25 LAB — BASIC METABOLIC PANEL
Anion gap: 9 (ref 5–15)
BUN: 20 mg/dL (ref 8–23)
CO2: 25 mmol/L (ref 22–32)
Calcium: 8.6 mg/dL — ABNORMAL LOW (ref 8.9–10.3)
Chloride: 103 mmol/L (ref 98–111)
Creatinine, Ser: 0.99 mg/dL (ref 0.61–1.24)
GFR, Estimated: >60 mL/min (ref >60)
Glucose, Bld: 140 mg/dL — ABNORMAL HIGH (ref 70–99)
Potassium: 4.1 mmol/L (ref 3.5–5.1)
Sodium: 137 mmol/L (ref 135–145)

## 2021-09-25 LAB — GLUCOSE, CAPILLARY
Glucose-Capillary: 168 mg/dL — ABNORMAL HIGH (ref 70–99)
Glucose-Capillary: 307 mg/dL — ABNORMAL HIGH (ref 70–99)

## 2021-09-25 LAB — CBC
HCT: 27.1 % — ABNORMAL LOW (ref 39.0–52.0)
Hemoglobin: 8.8 g/dL — ABNORMAL LOW (ref 13.0–17.0)
MCH: 29.8 pg (ref 26.0–34.0)
MCHC: 32.5 g/dL (ref 30.0–36.0)
MCV: 91.9 fL (ref 80.0–100.0)
Platelets: 461 10*3/uL — ABNORMAL HIGH (ref 150–400)
RBC: 2.95 MIL/uL — ABNORMAL LOW (ref 4.22–5.81)
RDW: 18 % — ABNORMAL HIGH (ref 11.5–15.5)
WBC: 24.9 10*3/uL — ABNORMAL HIGH (ref 4.0–10.5)
nRBC: 0 % (ref 0.0–0.2)

## 2021-09-25 LAB — MAGNESIUM: Magnesium: 2.2 mg/dL (ref 1.7–2.4)

## 2021-09-25 MED ORDER — FUROSEMIDE 20 MG PO TABS: 40.0000 mg | ORAL_TABLET | Freq: Every day | ORAL | 3 refills | Status: DC | PRN

## 2021-09-25 MED ORDER — GLIPIZIDE 5 MG PO TABS
5.0000 mg | ORAL_TABLET | Freq: Every day | ORAL | 0 refills | Status: AC
Start: 2021-09-25 — End: 2023-01-04

## 2021-09-25 MED ORDER — METFORMIN HCL 500 MG PO TABS: 500.0000 mg | ORAL_TABLET | Freq: Two times a day (BID) | ORAL | 0 refills | Status: AC

## 2021-09-25 MED ORDER — PREDNISONE 20 MG PO TABS: ORAL_TABLET | ORAL | 0 refills | Status: AC

## 2021-09-25 NOTE — Progress Notes (Signed)
Physical Therapy Treatment ?Patient Details ?Name: Eric Beltran. ?MRN: [id number] ?DOB: 11-01-1940 ?Today's Date: 09/25/2021 ? ? ?History of Present Illness Pt is a 81 y/o male admitted secondary to cough and SOB. Found to have sepsis due to staph hominis and epidermidis bacteremia, and hypoxic respiratory failure from pneumonitis. PMH includes sarcoma s/p R AKA, CAD, pulmonary mets. ? ?  ?PT Comments  ? ? The pt was agreeable to session, reports he remains hopeful to progress to return home. The pt was able to complete increased distance of hallway ambulation with use of RW while on RA at this time with SpO2 > 94%. The pt did report fatigue after ~75 ft ambulation and requested return to seated rest, but SpO2 was stable. Continued discussion with the pt about increased supervision and assist after d/c from hospital while he transitions to being home and regaining his strength and mobility with HHPT. Pt able to brainstorm family and friends who can be present for long portions of the day and assist as needed, reports he will continue to plan and make arrangements for supervision and assist.  ?   ?Recommendations for follow up therapy are one component of a multi-disciplinary discharge planning process, led by the attending physician.  Recommendations may be updated based on patient status, additional functional criteria and insurance authorization. ? ?Follow Up Recommendations ? Home health PT ?  ?  ?Assistance Recommended at Discharge Frequent or constant Supervision/Assistance  ?Patient can return home with the following A little help with walking and/or transfers;A little help with bathing/dressing/bathroom;Assistance with cooking/housework;Help with stairs or ramp for entrance;Assist for transportation ?  ?Equipment Recommendations ? None recommended by PT (has RW)  ?  ?Recommendations for Other Services   ? ? ?  ?Precautions / Restrictions Precautions ?Precautions: Fall;Other (comment) ?Precaution Comments: R  AKA; has prosthetic ?Restrictions ?Weight Bearing Restrictions: No  ?  ? ?Mobility ? Bed Mobility ?Overal bed mobility: Needs Assistance ?  ?  ?  ?  ?  ?  ?General bed mobility comments: pt OOB in recliner at start and end of session ?  ? ?Transfers ?Overall transfer level: Needs assistance ?Equipment used: Rolling walker (2 wheels) ?Transfers: Sit to/from Stand ?Sit to Stand: Min guard ?  ?  ?  ?  ?  ?General transfer comment: minG for safety as pt powered up to standing. cues for RUE hand placement. ?  ? ?Ambulation/Gait ?Ambulation/Gait assistance: Min guard ?Gait Distance (Feet): 75 Feet ?Assistive device: Rolling walker (2 wheels) ?Gait Pattern/deviations: Step-to pattern, Decreased step length - right, Decreased step length - left ?Gait velocity: decreased ?  ?  ?General Gait Details: pt with RLE maintained in front of LLE, stable on RA without need for steadying assist at any time. SpO2 >94% ? ? ? ?  ?Balance Overall balance assessment: Needs assistance ?Sitting-balance support: No upper extremity supported, Feet supported ?Sitting balance-Leahy Scale: Good ?  ?  ?Standing balance support: Bilateral upper extremity supported ?Standing balance-Leahy Scale: Poor ?Standing balance comment: Reliant on BUE support ?  ?  ?  ?  ?  ?  ?  ?  ?  ?  ?  ?  ? ?  ?Cognition Arousal/Alertness: Awake/alert ?Behavior During Therapy: [facility] for tasks assessed/performed ?Overall Cognitive Status: Impaired/Different from baseline ?Area of Impairment: Safety/judgement ?  ?  ?  ?  ?  ?  ?  ?  ?  ?  ?  ?  ?Safety/Judgement: Decreased awareness of deficits, Decreased awareness of safety ?  ?  ?  General Comments: pt stating he would like to go home alone, lengthy discussion about increased need for supervision ?  ?  ? ?  ?Exercises   ? ?  ?General Comments General comments (skin integrity, edema, etc.): Spo2 to low of 88% while donning prosthetic, but recovered quickly to 90s on RA, maintained >94% on RA with gait ?  ?  ? ?Pertinent  Vitals/Pain Pain Assessment ?Pain Assessment: No/denies pain  ? ? ? ?PT Goals (current goals can now be found in the care plan section) Acute Rehab PT Goals ?Patient Stated Goal: to feel better and go home ?PT Goal Formulation: With patient/family ?Time For Goal Achievement: 10/06/21 ?Potential to Achieve Goals: Good ?Progress towards PT goals: Progressing toward goals ? ?  ?Frequency ? ? ? Min 3X/week ? ? ? ?  ?PT Plan Current plan remains appropriate  ? ? ?   ?AM-PAC PT "6 Clicks" Mobility   ?Outcome Measure ? Help needed turning from your back to your side while in a flat bed without using bedrails?: A Little ?Help needed moving from lying on your back to sitting on the side of a flat bed without using bedrails?: A Little ?Help needed moving to and from a bed to a chair (including a wheelchair)?: A Little ?Help needed standing up from a chair using your arms (e.g., wheelchair or bedside chair)?: A Little ?Help needed to walk in hospital room?: A Little ?Help needed climbing 3-5 steps with a railing? : A Lot ?6 Click Score: 17 ? ?  ?End of Session Equipment Utilized During Treatment: Gait belt;Oxygen ?Activity Tolerance: Patient limited by fatigue ?Patient left: in chair;with call bell/phone within reach;with nursing/sitter in room ?Nurse Communication: Mobility status (SpO2 stable on RA) ?PT Visit Diagnosis: Unsteadiness on feet (R26.81);Muscle weakness (generalized) (M62.81) ?  ? ? ?Time: 1130-1202 ?PT Time Calculation (min) (ACUTE ONLY): 32 min ? ?Charges:  $Gait Training: 8-22 mins ?$Therapeutic Exercise: 8-22 mins          ?          ? ?West Carbo, PT, DPT  ? ?Acute Rehabilitation Department ?Pager #: [phone] - [phone] ? ? ?Sandra Cockayne ?09/25/2021, 1:04 PM ? ?

## 2021-09-25 NOTE — Progress Notes (Signed)
Mobility Specialist Progress Note: ? ? 09/25/21 1510  ?Mobility  ?Activity Ambulated with assistance to bathroom  ?Level of Assistance Contact guard assist, steadying assist  ?Assistive Device Front wheel walker  ?Distance Ambulated (ft) 15 ft  ?Activity Response Tolerated well  ?$Mobility charge 1 Mobility  ? ?Pt requesting to ambulate to BR. Required minG throughout ambulation. Pt left in BR, educated to pull string when done. NT notified.  ? ?Eric Beltran ?Acute Rehab ?Secure Chat or ?Office Phone: [phone] ? ?

## 2021-09-25 NOTE — Care Management Important Message (Signed)
Important Message ? ?Patient Details  ?Name: Eric Beltran. ?MRN: [id number] ?Date of Birth: 1941-03-16 ? ? ?Medicare Important Message Given:  Yes ? ? ? ? ?Shelda Altes ?09/25/2021, 8:55 AM ?

## 2021-09-25 NOTE — TOC Transition Note (Signed)
Transition of Care (TOC) - CM/SW Discharge Note ? ? ?Patient Details  ?Name: Eric Beltran. ?MRN: [id number] ?Date of Birth: 1941/03/09 ? ?Transition of Care (TOC) CM/SW Contact:  ?Zenon Mayo, RN ?Phone Number: ?09/25/2021, 3:05 PM ? ? ?Clinical Narrative:    ?Patient is for dc today, his son Hollice Espy will be transporting him home at dc. NCM offered choice , he chose [facility], NCM made referral to [facility] with [facility], he is able to take referral for [facility] [place].  Soc will begin 24 to 48 hrs post dc.   ? ? ?Final next level of care: [place] ?Barriers to Discharge: No Barriers Identified ? ? ?Patient Goals and CMS Choice ?Patient states their goals for this hospitalization and ongoing recovery are:: return home with [facility] ?CMS Medicare.gov Compare Post Acute Care list provided to:: Patient ?Choice offered to / list presented to : Patient ? ?Discharge Placement ?  ?           ?  ?  ?  ?  ? ?Discharge Plan and Services ?  ?Discharge Planning Services: CM Consult ?           ?  ?DME Agency: NA ?  ?  ?  ?HH Arranged: RN, Disease Management, PT ?[place] Agency: [place] ?Date HH Agency Contacted: 09/25/21 ?Time [place]: [id number] ?Representative spoke with at [place]: Tommi Rumps ? ?Social Determinants of Health (SDOH) Interventions ?  ? ? ?Readmission Risk Interventions ? ?  09/21/2021  ?  3:19 PM  ?Readmission Risk Prevention Plan  ?Transportation Screening Complete  ?PCP or Specialist Appt within 3-5 Days Complete  ?[place] or Home Care Consult Complete  ?Social Work Consult for [place] Planning/Counseling Complete  ?Palliative Care Screening Not Applicable  ?Medication Review Press photographer) Complete  ? ? ? ? ? ?

## 2021-09-25 NOTE — Discharge Summary (Signed)
? ?Physician Discharge Summary  ?Eric Beltran. UGQ:[id number] DOB: Dec 14, 1940 DOA: 09/16/2021 ? ?PCP: Celene Squibb, MD ? ?Admit date: 09/16/2021 ?Discharge date: 09/25/2021 ? ?Admitted From: home ?Disposition:  home ? ?Recommendations for Outpatient Follow-up:  ?Follow up with PCP in 1-2 weeks ?Please obtain BMP/CBC in one week ? ?Home Health: PT, RN ?Equipment/Devices: walker ? ?Discharge Condition: stable ?CODE STATUS: Full code ?Diet Orders (From admission, onward)  ? ?  Start     Ordered  ? 09/17/21 0003  Diet Heart Room service appropriate? Yes; Fluid consistency: Thin  Diet effective now       ?Question Answer Comment  ?Room service appropriate? Yes   ?Fluid consistency: Thin   ?  ? 09/17/21 0003  ? ?  ?  ? ?  ? ? ?HPI: Per admitting MD, ?Eric Beltran. is a pleasant 81 y.o. male with medical history significant for CAD, atrial fibrillation on Eliquis, sarcoma on chemotherapy, now presenting to the emergency department with shortness of breath and cough.  The patient reports 3 days of shortness of breath, fatigue, and cough.  He has some scant sputum production, has severe fatigue, but has not noticed any subjective fever or chills.  He denies any chest pain.  He denies orthopnea. ? ?Hospital Course / Discharge diagnoses: ?Active Problems: ?  Bloodstream infection due to central venous catheter ?  Paroxysmal atrial fibrillation (HCC) ?  Coronary artery disease involving coronary bypass graft of native heart with angina pectoris ([place]) ?  Essential hypertension ?  Sarcoma ([place]) ?  Hypokalemia ?  Acute on chronic respiratory failure with hypoxia (HCC) ?  Pneumonitis ? ? ?Principal problem ?Sepsis due to Staph hominis and epidermidis bacteremia-met sepsis criteria on admission with fever of 101.4, leukocytosis, tachycardia and the source.  ID consulted and followed patient while hospitalized. This was felt to be catheter related bloodstream infection, IR consulted and he is status post port removal 5/9.  Dr.  Cathlean Sauer discussed with the patient's primary oncology team at [facility] regarding catheter removal.  He completed a 5-day course of vancomycin per ID.  Current leukocytosis is likely due to steroids ?  ?Active problems ?Acute hypoxic respiratory failure due to hypersensitivity pneumonitis-chest x-ray with bilateral pulmonary infiltrates, there was concern initially for infectious process. On outside records review, patient had a CT scan of the chest on 09/15/2021 which showed some patchy groundglass opacities, concerning for fibrotic reaction to chemotherapy.  Pulmonary consulted, repeat CT scan here concerning for hypersensitivity pneumonitis due to amiodarone versus chemotherapy induced pneumonitis.  He was started on steroids with excellent response, over the course of past 4 days he was able to be transition from heated high flow 70% FiO2 and 25 L of oxygen to now ambulating without difficulties on room air.  He will be placed on a prolonged steroid taper, and will follow-up with pulmonology as an outpatient ?Acute on chronic diastolic UEK-8M echo done 5/8 showed EF 65 to 65%, no WMA.  He has grade 1 diastolic dysfunction.  RV was normal.  Remains euvolemic.  Continue Lasix as needed ?Paroxysmal atrial fibrillation ([place]), with RVR-Continue rate control with diltiazem, amiodarone discontinued due to his pneumonitis.  Continue Eliquis.  No further RVR episodes.  Continue current regimen.  His RVR episodes were likely due to underlying pulmonary issues ?Coronary artery disease involving coronary bypass graft of native heart with angina pectoris (HCC) -No chest pain, continue blood pressure control and antiplatelet therapy with clopidogrel.  ?Essential hypertension -blood pressure  stable ?Sarcoma ([place]) -Sp right AKA. Positive pulmonary metastasis. Undergoing chemotherapy as outpatient with [facility], with Dr. Lendon Collar.  Dr. Cathlean Sauer discussed with cancer center in [place].  May need his chemotherapy changed given  pneumonitis ?Hypokalemia -repleted ?Hyponatremia-sodium now normalized ? ? ?Discharge Instructions ? ? ?Allergies as of 09/25/2021   ?No Active Allergies ?  ? ?  ?Medication List  ?  ? ?STOP taking these medications   ? ?amiodarone 200 MG tablet ?Commonly known as: PACERONE ?  ?losartan 50 MG tablet ?Commonly known as: COZAAR ?  ? ?  ? ?TAKE these medications   ? ?atorvastatin 80 MG tablet ?Commonly known as: LIPITOR ?Take 1 tablet (80 mg total) by mouth at bedtime. ?  ?clopidogrel 75 MG tablet ?Commonly known as: PLAVIX ?TAKE 1 TABLET BY MOUTH DAILY WITH BREAKFAST ?What changed: when to take this ?  ?diltiazem 240 MG 24 hr capsule ?Commonly known as: CARDIZEM CD ?TAKE ONE CAPSULE BY MOUTH DAILY ?What changed: how to take this ?  ?Eliquis 5 MG Tabs tablet ?Generic drug: apixaban ?TAKE 1 TABLET BY MOUTH TWICE DAILY ?What changed: how much to take ?  ?Fish Oil 1000 MG Caps ?Take 2,000 mg by mouth daily. ?  ?furosemide 20 MG tablet ?Commonly known as: LASIX ?Take 2 tablets (40 mg total) by mouth daily as needed for edema or fluid. ?What changed:  ?when to take this ?reasons to take this ?  ?glipiZIDE 5 MG tablet ?Commonly known as: GLUCOTROL ?Take 1 tablet (5 mg total) by mouth daily before breakfast. ?  ?isosorbide mononitrate 60 MG 24 hr tablet ?Commonly known as: IMDUR ?TAKE 1 TABLET BY MOUTH TWICE DAILY every morning AND in THE evening ?What changed: See the new instructions. ?  ?metFORMIN 500 MG tablet ?Commonly known as: Glucophage ?Take 1 tablet (500 mg total) by mouth 2 (two) times daily with a meal. ?  ?multivitamin tablet ?Take 1 tablet by mouth at bedtime. ?  ?nitroGLYCERIN 0.4 MG SL tablet ?Commonly known as: NITROSTAT ?DISSOLVE 1 TABLET UNDER THE TONGUE EVERY 5 MINUTES AS NEEDED FOR CHEST PAIN. DO NOT EXCEED A TOTAL OF 3 DOSES IN 15 MINUTES. ?What changed: See the new instructions. ?  ?predniSONE 20 MG tablet ?Commonly known as: DELTASONE ?Take 3 tablets (60 mg total) by mouth daily with breakfast for 6  days, THEN 2.5 tablets (50 mg total) daily with breakfast for 6 days, THEN 2 tablets (40 mg total) daily with breakfast for 6 days, THEN 1.5 tablets (30 mg total) daily with breakfast for 6 days, THEN 1 tablet (20 mg total) daily with breakfast for 6 days. ?Start taking on: Sep 25, 2021 ?  ?tamsulosin 0.4 MG Caps capsule ?Commonly known as: FLOMAX ?Take 0.4 mg by mouth at bedtime. ?  ?valACYclovir 500 MG tablet ?Commonly known as: VALTREX ?Take 500 mg by mouth daily as needed (fever blisters). ?  ?Vitamin C 500 MG Caps ?Take 1 tablet by mouth daily. ?  ?zinc gluconate 50 MG tablet ?Take 50 mg by mouth at bedtime. ?  ? ?  ? ? Follow-up Information   ? ? Margaretha Seeds, MD Follow up in 1 month(s).   ?Specialty: Pulmonary Disease ?Why: Please call off if no appointment scheduled ?Contact information: ?[address] ?[address] ?[place] [address] ?[phone] ? ? ?  ?  ? ?  ?  ? ?  ? ? ?Consultations: ?Pulmonary ?ID ?IR ? ?Procedures/Studies: ? ?DG Chest 1 View ? ?Result Date: 09/19/2021 ?CLINICAL DATA:  Respiratory distress. EXAM: CHEST  1 VIEW COMPARISON:  Chest radiograph dated 09/18/2021. FINDINGS: Right-sided Port-A-Cath in similar position. There is diffuse interstitial and airspace opacities, progressed since the prior radiograph and may represent worsening edema, pneumonia, or combination. Clinical correlation is recommended. No large pleural effusion. No pneumothorax. Median sternotomy wires and CABG vascular clips. Stable cardiomegaly. No acute osseous pathology. IMPRESSION: Worsening interstitial and airspace opacities may represent worsening edema, pneumonia, or combination. Electronically Signed   By: Anner Crete M.D.   On: 09/19/2021 03:04  ? ?DG Chest 1 View ? ?Result Date: 09/18/2021 ?CLINICAL DATA:  Dyspnea EXAM: CHEST  1 VIEW COMPARISON:  Previous studies including the examination of 09/16/2021 FINDINGS: Transverse diameter of heart is increased. Increased interstitial markings are seen in both  lungs with interval worsening in the left lower lung fields. There is minimal blunting of left lateral CP angle. There is no pneumothorax. Tip of right IJ chest port is seen in the region of right atrium. [place]

## 2021-09-27 DIAGNOSIS — I088 Other rheumatic multiple valve diseases: Secondary | ICD-10-CM | POA: Diagnosis not present

## 2021-09-27 DIAGNOSIS — J189 Pneumonia, unspecified organism: Secondary | ICD-10-CM | POA: Diagnosis not present

## 2021-09-27 DIAGNOSIS — I25119 Atherosclerotic heart disease of native coronary artery with unspecified angina pectoris: Secondary | ICD-10-CM | POA: Diagnosis not present

## 2021-09-27 DIAGNOSIS — I110 Hypertensive heart disease with heart failure: Secondary | ICD-10-CM | POA: Diagnosis not present

## 2021-09-27 DIAGNOSIS — I4510 Unspecified right bundle-branch block: Secondary | ICD-10-CM | POA: Diagnosis not present

## 2021-09-27 DIAGNOSIS — J9621 Acute and chronic respiratory failure with hypoxia: Secondary | ICD-10-CM | POA: Diagnosis not present

## 2021-09-27 DIAGNOSIS — I5033 Acute on chronic diastolic (congestive) heart failure: Secondary | ICD-10-CM | POA: Diagnosis not present

## 2021-09-27 DIAGNOSIS — C7800 Secondary malignant neoplasm of unspecified lung: Secondary | ICD-10-CM | POA: Diagnosis not present

## 2021-09-27 DIAGNOSIS — I440 Atrioventricular block, first degree: Secondary | ICD-10-CM | POA: Diagnosis not present

## 2021-09-27 DIAGNOSIS — I480 Paroxysmal atrial fibrillation: Secondary | ICD-10-CM | POA: Diagnosis not present

## 2021-09-27 DIAGNOSIS — I471 Supraventricular tachycardia: Secondary | ICD-10-CM | POA: Diagnosis not present

## 2021-09-27 DIAGNOSIS — I493 Ventricular premature depolarization: Secondary | ICD-10-CM | POA: Diagnosis not present

## 2021-09-27 DIAGNOSIS — C4921 Malignant neoplasm of connective and soft tissue of right lower limb, including hip: Secondary | ICD-10-CM | POA: Diagnosis not present

## 2021-09-27 DIAGNOSIS — I25709 Atherosclerosis of coronary artery bypass graft(s), unspecified, with unspecified angina pectoris: Secondary | ICD-10-CM | POA: Diagnosis not present

## 2021-09-27 DIAGNOSIS — I739 Peripheral vascular disease, unspecified: Secondary | ICD-10-CM | POA: Diagnosis not present

## 2021-09-27 DIAGNOSIS — T80211D Bloodstream infection due to central venous catheter, subsequent encounter: Secondary | ICD-10-CM | POA: Diagnosis not present

## 2021-09-28 DIAGNOSIS — Z89619 Acquired absence of unspecified leg above knee: Secondary | ICD-10-CM | POA: Diagnosis not present

## 2021-09-28 DIAGNOSIS — R0600 Dyspnea, unspecified: Secondary | ICD-10-CM | POA: Diagnosis not present

## 2021-09-28 DIAGNOSIS — I10 Essential (primary) hypertension: Secondary | ICD-10-CM | POA: Diagnosis not present

## 2021-09-28 DIAGNOSIS — I482 Chronic atrial fibrillation, unspecified: Secondary | ICD-10-CM | POA: Diagnosis not present

## 2021-10-06 ENCOUNTER — Telehealth: Admit: 2021-10-06 | Discharge: 2021-10-07 | Payer: MEDICARE

## 2021-10-06 DIAGNOSIS — I712 Thoracic aortic aneurysm without rupture, unspecified part (CMS-HCC): Principal | ICD-10-CM

## 2021-10-06 DIAGNOSIS — C499 Malignant neoplasm of connective and soft tissue, unspecified: Secondary | ICD-10-CM | POA: Diagnosis not present

## 2021-10-06 DIAGNOSIS — I5032 Chronic diastolic (congestive) heart failure: Secondary | ICD-10-CM | POA: Diagnosis not present

## 2021-10-06 DIAGNOSIS — I209 Angina pectoris, unspecified: Principal | ICD-10-CM

## 2021-10-06 DIAGNOSIS — I480 Paroxysmal atrial fibrillation: Principal | ICD-10-CM

## 2021-10-06 DIAGNOSIS — C7801 Secondary malignant neoplasm of right lung: Principal | ICD-10-CM

## 2021-10-06 DIAGNOSIS — Z951 Presence of aortocoronary bypass graft: Principal | ICD-10-CM

## 2021-10-06 DIAGNOSIS — Z89619 Acquired absence of unspecified leg above knee: Principal | ICD-10-CM

## 2021-10-06 DIAGNOSIS — C7802 Secondary malignant neoplasm of left lung: Principal | ICD-10-CM

## 2021-10-06 MED ORDER — PAZOPANIB 200 MG TABLET
ORAL_TABLET | Freq: Every day | ORAL | 6 refills | 60 days | Status: CP
Start: 2021-10-06 — End: ?

## 2021-10-10 DIAGNOSIS — C499 Malignant neoplasm of connective and soft tissue, unspecified: Principal | ICD-10-CM

## 2021-10-13 ENCOUNTER — Inpatient Hospital Stay: Payer: Medicare Other | Admitting: Pulmonary Disease

## 2021-10-19 ENCOUNTER — Other Ambulatory Visit: Payer: Medicare Other

## 2021-10-24 ENCOUNTER — Ambulatory Visit: Payer: Medicare Other | Admitting: Pulmonary Disease

## 2021-10-24 ENCOUNTER — Encounter: Payer: Self-pay | Admitting: Pulmonary Disease

## 2021-10-24 VITALS — BP 140/78 | HR 81 | Temp 97.6°F | Ht 66.0 in | Wt 175.2 lb

## 2021-10-24 DIAGNOSIS — T462X1D Poisoning by other antidysrhythmic drugs, accidental (unintentional), subsequent encounter: Secondary | ICD-10-CM

## 2021-10-24 DIAGNOSIS — T462X1A Poisoning by other antidysrhythmic drugs, accidental (unintentional), initial encounter: Secondary | ICD-10-CM | POA: Insufficient documentation

## 2021-10-24 DIAGNOSIS — D481 Neoplasm of uncertain behavior of connective and other soft tissue: Principal | ICD-10-CM

## 2021-10-24 DIAGNOSIS — B001 Herpesviral vesicular dermatitis: Principal | ICD-10-CM

## 2021-10-24 DIAGNOSIS — C7989 Secondary malignant neoplasm of other specified sites: Principal | ICD-10-CM
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
## 2021-10-24 NOTE — Patient Instructions (Addendum)
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

## 2021-11-06 ENCOUNTER — Other Ambulatory Visit: Payer: Medicare Other

## 2021-11-08 ENCOUNTER — Ambulatory Visit: Admit: 2021-11-08 | Discharge: 2021-11-09 | Payer: MEDICARE

## 2021-11-08 DIAGNOSIS — C7951 Secondary malignant neoplasm of bone: Secondary | ICD-10-CM | POA: Diagnosis not present

## 2021-11-08 DIAGNOSIS — I712 Thoracic aortic aneurysm, without rupture, unspecified: Secondary | ICD-10-CM | POA: Diagnosis not present

## 2021-11-08 DIAGNOSIS — C499 Malignant neoplasm of connective and soft tissue, unspecified: Principal | ICD-10-CM

## 2021-11-08 DIAGNOSIS — C7801 Secondary malignant neoplasm of right lung: Principal | ICD-10-CM

## 2021-11-08 DIAGNOSIS — C7802 Secondary malignant neoplasm of left lung: Principal | ICD-10-CM

## 2021-11-08 DIAGNOSIS — C7800 Secondary malignant neoplasm of unspecified lung: Secondary | ICD-10-CM | POA: Diagnosis not present

## 2021-11-08 MED ORDER — PAZOPANIB 200 MG TABLET
ORAL_TABLET | Freq: Every day | ORAL | 0 refills | 14 days | Status: CP
Start: 2021-11-08 — End: ?
  Filled 2021-11-09: qty 56, 14d supply, fill #0

## 2021-11-08 NOTE — Unmapped (Signed)
Clinical Pharmacist Practitioner: Telephone Note    Reason for Call - Medication Access    I called Mr. Calica to discuss starting pazopanib. Reviewed how MFR assistance application was submitted, and may take ~1-2wk for final approval. Sent a prescription for temporary pazopanib  supply to [facility] with a 14d voucher card which will allow patient to start medication while we are awaiting approval from drug company. Provided patient with number to active voucher card and requested he call to active in order to obtain temporary supply. Anticipate patient will start medication today or tomorrow after voucher has been activated. MD appointment already scheduled for 7/11 with Dr. Meredith Mody for first toxicity check.     I spent 15 minutes in direct patient care.    Nicholos Johns, PharmD, CPP  Clinical Pharmacist Practitioner, Thoracic Oncology & Sarcoma  Pager: [phone]

## 2021-11-09 NOTE — Unmapped (Signed)
[facility]   Patient Onboarding/Medication Counseling    Stephen Perkins is a 81 y.o. male with sarcoma who I am counseling today on initiation of therapy.  I am speaking to the patient.    Was a Nurse, learning disability used for this call? No    Verified patient's date of birth / HIPAA.    Specialty medication(s) to be sent: Hematology/Oncology: Votrient    Non-specialty medications/supplies to be sent: none    Medications not needed at this time: none     Votrient (Pazopanib)    Medication & Administration     Dosage: Take 4 tablets (800 mg) by mouth once daily    Administration:   Take on an empty stomach (1 hour before or 2 hours after a meal)  Do not crush tablets    Adherence/Missed dose instructions:  Take a missed dose as soon as you think of it.  If less than 12 hours until next dose, skip missed dose and resume normal schedule  Do not take 2 doses at the same time or extra doses    Goals of Therapy     Prevent disease progression    Side Effects & Monitoring Parameters     Common side effects  Feeling tired or weak  Signs of high blood pressure (severe headache, dizziness, passing out, or change in eyesight)  Nausea, vomiting, diarrhea, upset stomach or stomach pain,   Decreased appetite or weight loss, change in taste of food  Change in color of skin or hair, change in nails, hair loss  Mouth irritation or mouth sores  Muscle or bone pain    The following side effects should be reported to the provider:  Allergic reaction  Black box warning - Liver issues (dark urine, light colored stool, swelling in legs/ankles, yellowing of skin or eyes or swelling in belly)  Signs of low thyroid levels (constipation, cold intolerance, memory problems, mood changes, abnormal burning/numbness/tingling)  Signs of high blood sugar levels (increased thirst, hunger, urination; fruity breath)  Signs of electrolyte problems (mood changes, confusion, muscle pain or weakness, abnormal heartbeat, seizures, not hungry, or bad upset stomach/throwing up  Redness or irritation of the palms or soles    Monitoring Parameters  Monitor liver function test at baseline, weeks 3,5,7,9 and months 3,4 and as needed  Monitor serum electrolytes  Monitor urinalysis  Monitor thyroid function tests baseline and every 6-8 weeks during treatment  Monitor ECG's at baseline and periodic  Monitor blood pressure  Pregnancy test prior to treatment in females of reproductive potential    Contraindications, Warnings, & Precautions     Females of reproductive potential should use contraception during therapy and for at least 2 weeks after last dose  Male patients with a pregnant partner or partner of reproductive potential should use condoms during treatment and for at least 2 weeks after last dose.  Black Box Warning: Hepatotoxicity  May cause new or worsening of existing heart failure  GI perforation and fistula  Hand-foot  skin reaction  Hemorrhage  Hypertension  Infections  Ocular toxicity  Pulmonary toxicity  QTc prolongation  Thyroid disorder  Wound healing complications - temporarily holding treatment for patients undergoing major surgical procedures is recommended  Reversible posterior leukoencephalopathy syndrome - monitor for sympotoms of headache, hypertension, seizure, vision changes     Drug/Food Interactions     Medication list reviewed in Epic. The patient was instructed to inform the care team before taking any new medications or supplements.  Amiodarone  may enhance the QTc-prolonging effect of PAZOPanib. Amiodarone may increase the serum concentration of PAZOPanib - may need to start with 400 mg daily dose due to potential DDI. [name]   Avoid grapefruit and grapefruit juice  Avoid antacids if possible. If antacids are necessary, separate doses by several hours.  Avoid proton pump inhibitors and H2 Receptor agonists   Avoid live vaccines for at least 3 months after stopping Votrient    Storage, Handling Precautions, & Disposal     Store at room temperature  Keep away from children and pets  This drug is considered hazardous and should be handled as little as possible.  If someone else helps with medication administration, they should wear gloves.    Current Medications (including OTC/herbals), Comorbidities and Allergies     Current Outpatient Medications   Medication Sig Dispense Refill    acetaminophen (TYLENOL) 325 MG tablet Take 2 tablets (650 mg total) by mouth every four (4) hours as needed.  0    albuterol 2.5 mg /3 mL (0.083 %) nebulizer solution 3 mL as needed Inhalation every 6 hrs for 30 days      amiodarone (PACERONE) 200 MG tablet TAKE 1/2 TABLET BY MOUTH DAILY (CUT IN HALF FOR PATIENT)      apixaban (ELIQUIS) 5 mg Tab Take 1 tablet (5 mg total) by mouth Two (2) times a day.      ascorbic acid, vitamin C, (VITAMIN C) 1000 MG tablet Take 1 tablet (1,000 mg total) by mouth daily.      atorvastatin (LIPITOR) 80 MG tablet Take 1 tablet (80 mg total) by mouth.      carboxymethylcellulose (REFRESH PLUS) 0.5 % Dpet Administer 1 drop to both eyes two (2) times a day.      dexAMETHasone (DECADRON) 4 MG tablet Take 2 tablets (8 mg total) by mouth Two (2) times a day. Take the day before, the evening of treatment and day after docetaxel 36 tablet 1    diltiazem (CARDIZEM CD) 240 MG 24 hr capsule TAKE 1 CAPSULE BY MOUTH EVERY DAY      docosahexaenoic acid-epa 120-180 mg cap Take 1 capsule by mouth daily.       finasteride (PROSCAR) 5 mg tablet TAKE 1 TABLET BY MOUTH DAILY 90 tablet 12    furosemide (LASIX) 20 MG tablet Take 2 tablets (40 mg total) by mouth daily.  2    gabapentin (NEURONTIN) 300 MG capsule TAKE ONE CAPSULE BY MOUTH THREE TIMES DAILY 90 capsule 0    isosorbide mononitrate (IMDUR) 60 MG 24 hr tablet Take 1 tablet (60 mg total) by mouth Two (2) times a day.      losartan (COZAAR) 50 MG tablet Take 1 tablet (50 mg total) by mouth daily.      metoprolol tartrate (LOPRESSOR) 25 MG tablet TAKE 1 TABLET BY MOUTH TWICE DAILY      MORPhine (MS CONTIN) 15 MG 12 hr tablet Take 1 tablet (15 mg total) by mouth every twelve (12) hours. 60 tablet 0    multivitamin (TAB-A-VITE/THERAGRAN) per tablet Take 1 tablet by mouth nightly.      neomycin-polymyxin-dexamethasone (MAXITROL) 3.5mg /mL-10,000 unit/mL-0.1 % ophthalmic suspension       nitroglycerin (NITROSTAT) 0.4 MG SL tablet       ondansetron (ZOFRAN) 8 MG tablet       oxyCODONE (ROXICODONE) 5 MG immediate release tablet Take 1 tablet (5 mg total) by mouth every four (4) hours as needed for pain. 20 tablet 0  pantoprazole (PROTONIX) 40 MG tablet 1 tablet Orally Once a day      PAZOPanib 200 mg tablet Take 2 tablets (400 mg total) by mouth daily. 120 tablet 6    PAZOPanib 200 mg tablet Take 4 tablets (800 mg total) by mouth daily. 56 tablet 0    PLAVIX 75 mg tablet 1/2 tablet Orally Once a day      prochlorperazine (COMPAZINE) 10 MG tablet Take 1 tablet (10 mg total) by mouth every six (6) hours as needed (Nausea/Vomiting). 30 tablet 2    tamsulosin (FLOMAX) 0.4 mg capsule TAKE ONE CAPSULE BY MOUTH EVERY DAY 30 capsule 5    valACYclovir (VALTREX) 500 MG tablet TAKE 1 TABLET BY MOUTH DAILY 30 tablet 5    vardenafil (LEVITRA) 20 MG tablet Take 1 tablet (20 mg total) by mouth daily as needed for erectile dysfunction. 60 tablet 3    zinc gluconate 50 mg tablet Take 1 tablet (50 mg total) by mouth.       No current facility-administered medications for this visit.       Allergies   Allergen Reactions    Chocolate Flavor (Bulk)     Other Other (See Comments)     Cause sinus drainage and snorting-certain foods       Patient Active Problem List   Diagnosis    Renal mass    Personal history of malignant neoplasm of skin    BPH with obstruction/lower urinary tract symptoms    Lumbar spondylosis    Mass of left side of neck    Abnormal cardiovascular function study    Abnormal myocardial perfusion study    Actinic keratosis    Aneurysm of thoracic aorta (CMS-HCC)    Angina pectoris (CMS-HCC)    Benign essential hypertension    Cardiac murmur    Coronary artery disease    Mixed hyperlipidemia    Neoplasm of connective tissue    Osteoarthritis of right hip    S/P CABG x 4    Status post total replacement of right hip    Urinary retention    Cancer related pain    Sarcoma (CMS-HCC)    Lung nodules    Screening for viral disease    Malignant neoplasm metastatic to both lungs (CMS-HCC)    Status post unilateral above-knee amputation (CMS-HCC)    PAF (paroxysmal atrial fibrillation) (CMS-HCC)    Chest pain, rule out acute myocardial infarction     Reviewed and up to date in Epic.    Appropriateness of Therapy     Acute infections noted within Epic:  No active infections  Patient reported infection: None    Is medication and dose appropriate based on diagnosis and infection status? Yes    Prescription has been clinically reviewed: Yes    Baseline Quality of Life Assessment      How many days over the past month did your sarcoma  keep you from your normal activities? For example, brushing your teeth or getting up in the morning. 0    Financial Information     Medication Assistance provided: Copay Assistance    Anticipated copay of $0 / 14 days reviewed with patient. Verified delivery address.    Delivery Information     Scheduled delivery date: 11/10/21    Expected start date: ASAP    Medication will be delivered via UPS to the prescription address in [facility].  This shipment will not require a signature.      Explained the  services we provide at [facility] and that each month we would call to set up refills.  Stressed importance of returning phone calls so that we could ensure they receive their medications in time each month.  Informed patient that we should be setting up refills 7-10 days prior to when they will run out of medication.  A pharmacist will reach out to perform a clinical assessment periodically.  Informed patient that a welcome packet, containing information about our pharmacy and other support services, a Notice of Privacy Practices, and a drug information handout will be sent.      The patient or caregiver noted above participated in the development of this care plan and knows that they can request review of or adjustments to the care plan at any time.      Patient or caregiver verbalized understanding of the above information as well as how to contact the pharmacy at [phone] option 4 with any questions/concerns.  The pharmacy is open Monday through Friday 8:30am-4:30pm.  A pharmacist is available 24/7 via pager to answer any clinical questions they may have.    Patient Specific Needs     Does the patient have any physical, cognitive, or cultural barriers? No    Does the patient have adequate living arrangements? (i.e. the ability to store and take their medication appropriately) Yes    Did you identify any home environmental safety or security hazards? No    Patient prefers to have medications discussed with  Patient     Is the patient or caregiver able to read and understand education materials at a high school level or above? Yes    Patient's primary language is  English     Is the patient high risk? Yes, patient is taking oral chemotherapy. Appropriateness of therapy as been assessed    SOCIAL DETERMINANTS OF HEALTH     At the [facility], we have learned that life circumstances - like trouble affording food, housing, utilities, or transportation can affect the health of many of our patients.   That is why we wanted to ask: are you currently experiencing any life circumstances that are negatively impacting your health and/or quality of life? No    Social Determinants of Psychologist, prison and probation services Strain: Not on file   Internet Connectivity: Not on file   Food Insecurity: Not on file   Tobacco Use: Low Risk     Smoking Tobacco Use: Never    Smokeless Tobacco Use: Never    Passive Exposure: Not on file   Housing/Utilities: Not on file   Alcohol Use: Not on file   Transportation Needs: Not on file   Substance Use: Not on file   Health Literacy: Not on file   Physical Activity: Not on file   Interpersonal Safety: Not on file   Stress: Not on file   Intimate Partner Violence: Not on file   Depression: Not on file   Social Connections: Not on file     Would you be willing to receive help with any of the needs that you have identified today? Not applicable     Srishti Strnad A [facility] Specialty Pharmacist

## 2021-11-09 NOTE — Unmapped (Signed)
Outgoing call to patient to discuss his questions re: care coordination and plan of care.  Left VMM asking patient for return call to [phone].

## 2021-11-09 NOTE — Unmapped (Signed)
[facility] Specialty Medication Onboarding    Specialty Medication: Votrient  Prior Authorization: Approved   Financial Assistance: Mfr assist referral pending  Final Copay/Day Supply: $0 / 14    Insurance Restrictions: 14-day voucher    Notes to Pharmacist: None    The triage team has completed the benefits investigation and has determined that the patient is able to fill this medication at [facility]. Please contact the patient to complete the onboarding or follow up with the prescribing physician as needed.

## 2021-11-10 DIAGNOSIS — C7802 Secondary malignant neoplasm of left lung: Secondary | ICD-10-CM | POA: Diagnosis not present

## 2021-11-10 DIAGNOSIS — C7801 Secondary malignant neoplasm of right lung: Secondary | ICD-10-CM | POA: Diagnosis not present

## 2021-11-10 DIAGNOSIS — C499 Malignant neoplasm of connective and soft tissue, unspecified: Secondary | ICD-10-CM | POA: Diagnosis not present

## 2021-11-10 NOTE — Unmapped (Signed)
Patient returned call to this NN.  He reports the pazopanib is supposed to be delivered today and he is planning on starting it tonight.    He also reports he has coughed up a little blood the last 2 days.  He said it is a small amount mixed with mucus and happened about 4 times each day over the last 2 days. He reports he is breathing well and has no other issues.  Reports about 2 weeks ago he completed the Prednisone titration that was prescribed when he was discharged from the hospital. Cough remains much better than when he was hospitalized, however, this is the first hemoptysis he has had.     He also requested Dr. Meredith Mody call him with his CT scan results from yesterday. He is anxious and doesn't want to wait until 7/11 for the results.    Inbasket to Dr. Meredith Mody and team sent today.      ------------------------------------------------------------------------------------------------------------------  ---- Message -----   From: Harriette Ohara, CPP   Sent: 11/08/2021   5:03 PM EDT   To: Sheral Apley, MD, Lajuana Matte, RN, *   Subject: RE: Discharged home                              Not yet approved - but I just sent new Rx w/ 14d voucher card that should get him started. [facility] will delivery & my hope is he'll get it before Saturday.     This will give [employer] time to have it approved for future doses after the temporary supply runs out.     Juanita Craver   ----- Message -----   From: Sheral Apley, MD   Sent: 11/08/2021   4:22 PM EDT   To: Lajuana Matte, RN, Kandis Ban, *   Subject: RE: Discharged home                              All correct. Just waiting for him to start new drug (which we have ordered and is just working its way through the approval process), then the follow up visits will come together. I believe the drug has been approved, yes, Caryn Bee?     Our clinic is indeed closed for the 4th. The scan is just a new baseline so that we can more accurately measure change on the new treatment.     Revonda Standard, can you call him and give him this reassurance? We are not ignoring him, we are actively working on getting his next treatment started and the delays are on the drug company side.     J     ----- Message -----   From: Lajuana Matte, RN   Sent: 11/07/2021   3:48 PM EDT   To: Sheral Apley, MD, Kandis Ban, *   Subject: RE: Discharged home                              I think the scan is meant to be a baseline for the new treatment and the new treatment is going to be oral, and I don't think he has started it yet. The plan is supposed to be to start the pazopanib--have a tox check a week later with Kevin--then MD 2 weeks after that (4 weeks from  starting the med).   Leta Jungling and Caryn Bee can jump in. But other than the scan, which is a new baseline, the other dates are dependent on when he starts his pazopanib.

## 2021-11-10 NOTE — Unmapped (Signed)
Oncology Telephone Note    Reason for Call:   Stephen Perkins Stephen Perkins. is a 81 y.o. man with metastatic sarcoma    Discussion:   Reviewed recent scan results. They show progression of disease in pulmonary nodules, but this is not unexpected. These scans will serve as a new baseline.    Plan:   Start pazopanib 400mg  daily. He received this today. Move next follow up visit with me out to 1 month from now.        The patient reports they are currently: at home. I spent 15 minutes on the phone with the patient on the date of service. I spent an additional 5 minutes on pre- and post-visit activities on the date of service.     The patient was physically located in [place] or a state in which I am permitted to provide care. The patient and/or parent/guardian understood that s/he may incur co-pays and cost sharing, and agreed to the telemedicine visit. The visit was reasonable and appropriate under the circumstances given the patient's presentation at the time.    The patient and/or parent/guardian has been advised of the potential risks and limitations of this mode of treatment (including, but not limited to, the absence of in-person examination) and has agreed to be treated using telemedicine. The patient's/patient's family's questions regarding telemedicine have been answered.     If the visit was completed in an ambulatory setting, the patient and/or parent/guardian has also been advised to contact their provider???s office for worsening conditions, and seek emergency medical treatment and/or call 911 if the patient deems either necessary.        Donzetta Sprung, MD/MPH  Assistant Professor  Bone and Soft Tissue Oncology Program  [facility]  Pager: [phone]  Nurse Navigator: Roseanne Reno RN, BSN, OCN

## 2021-11-15 DIAGNOSIS — C499 Malignant neoplasm of connective and soft tissue, unspecified: Principal | ICD-10-CM

## 2021-11-22 ENCOUNTER — Other Ambulatory Visit: Payer: Self-pay | Admitting: Cardiology

## 2021-11-24 ENCOUNTER — Other Ambulatory Visit: Payer: Self-pay | Admitting: Cardiology

## 2021-11-28 ENCOUNTER — Other Ambulatory Visit: Payer: Self-pay | Admitting: Cardiology

## 2021-11-28 ENCOUNTER — Institutional Professional Consult (permissible substitution): Admit: 2021-11-28 | Discharge: 2021-11-29 | Payer: MEDICARE

## 2021-11-28 NOTE — Unmapped (Incomplete)
Clinical Pharmacist Practitioner: [facility]    Patient Name: Stephen Perkins.  Patient Age: 81 y.o.    Assessment and recommendations:    1. UPS    Follow- up: in 2wks w/ Dr. Meredith Mody    ______________________________________________________________________    Reason for visit:  Pazopanib monitoring and side effect management    HPI:  ***    Interval History:  ***    ***N&V  ***diarrhea  ***BP  ***HFSR/rash  ***fatigue  ***LFTs?  ***start date?  ***adherance (once daily, empty stomach)  ***refills/mfr assistance    ***amiodarone?  ***steroids  ***breathing?        Nauseous today & about a week ago, spitting up today, last week vomited a handful. No nausea w/ prior chemo.     Eating well, although less appetite last week or so, not lost any weight.    No diarrhea, fairly regular.    140/80, checking daily, not writing down.     Energy is normal most days, mowing lawn regularly same as before.    Started 6/30, taking 2 tabs daily, taking BID    Stopped amiodarone, no steroids, breathing same      ***LFT next week at [facility] History   Sarcoma (CMS-HCC)   08/04/2020 Initial Diagnosis    Sarcoma (CMS-HCC)       03/28/2021 - 04/25/2021 Chemotherapy    OP SARCOMA DOXORUBICIN LIPOSOMAL (EVERY 28 DAYS)  DOXOrubicin liposomal 40 mg/m2 IV on day 1, every 28 days       03/29/2021 -  Cancer Staged    Staging form: Soft Tissue Sarcoma, AJCC 7th Edition  - Clinical: Stage IV (T1, N0, M1, G3) - Signed by Sheral Apley, MD on 03/29/2021           06/06/2021 - 09/05/2021 Chemotherapy    OP SARCOMA GEMCITABINE/DOCETAXEL   gemcitabine 900 mg/m2 IV on days 1,8, DOCEtaxel 75 mg/m2 IV on day 8, every 21 days until disease progression or DLT           Vital Signs for this encounter:  There were no vitals taken for this visit.  Wt Readings from Last 3 Encounters:   08/28/21 82.6 kg (182 lb 1.6 oz)   08/24/21 83 kg (183 lb)   06/13/21 85.6 kg (188 lb 11.2 oz)       Medications:  Current Outpatient Medications Medication Sig Dispense Refill    acetaminophen (TYLENOL) 325 MG tablet Take 2 tablets (650 mg total) by mouth every four (4) hours as needed.  0    albuterol 2.5 mg /3 mL (0.083 %) nebulizer solution 3 mL as needed Inhalation every 6 hrs for 30 days      amiodarone (PACERONE) 200 MG tablet TAKE 1/2 TABLET BY MOUTH DAILY (CUT IN HALF FOR PATIENT)      apixaban (ELIQUIS) 5 mg Tab Take 1 tablet (5 mg total) by mouth Two (2) times a day.      ascorbic acid, vitamin C, (VITAMIN C) 1000 MG tablet Take 1 tablet (1,000 mg total) by mouth daily.      atorvastatin (LIPITOR) 80 MG tablet Take 1 tablet (80 mg total) by mouth.      carboxymethylcellulose (REFRESH PLUS) 0.5 % Dpet Administer 1 drop to both eyes two (2) times a day.      dexAMETHasone (DECADRON) 4 MG tablet Take 2 tablets (8 mg total) by mouth Two (2) times a day. Take the day before, the evening  of treatment and day after docetaxel 36 tablet 1    diltiazem (CARDIZEM CD) 240 MG 24 hr capsule TAKE 1 CAPSULE BY MOUTH EVERY DAY      docosahexaenoic acid-epa 120-180 mg cap Take 1 capsule by mouth daily.       finasteride (PROSCAR) 5 mg tablet TAKE 1 TABLET BY MOUTH DAILY 90 tablet 12    furosemide (LASIX) 20 MG tablet Take 2 tablets (40 mg total) by mouth daily.  2    gabapentin (NEURONTIN) 300 MG capsule TAKE ONE CAPSULE BY MOUTH THREE TIMES DAILY 90 capsule 0    isosorbide mononitrate (IMDUR) 60 MG 24 hr tablet Take 1 tablet (60 mg total) by mouth Two (2) times a day.      losartan (COZAAR) 50 MG tablet Take 1 tablet (50 mg total) by mouth daily.      metoprolol tartrate (LOPRESSOR) 25 MG tablet TAKE 1 TABLET BY MOUTH TWICE DAILY      MORPhine (MS CONTIN) 15 MG 12 hr tablet Take 1 tablet (15 mg total) by mouth every twelve (12) hours. 60 tablet 0    multivitamin (TAB-A-VITE/THERAGRAN) per tablet Take 1 tablet by mouth nightly.      neomycin-polymyxin-dexamethasone (MAXITROL) 3.5mg /mL-10,000 unit/mL-0.1 % ophthalmic suspension       nitroglycerin (NITROSTAT) 0.4 MG SL tablet       ondansetron (ZOFRAN) 8 MG tablet       oxyCODONE (ROXICODONE) 5 MG immediate release tablet Take 1 tablet (5 mg total) by mouth every four (4) hours as needed for pain. 20 tablet 0    pantoprazole (PROTONIX) 40 MG tablet 1 tablet Orally Once a day      PAZOPanib 200 mg tablet Take 2 tablets (400 mg total) by mouth daily. 120 tablet 6    PAZOPanib 200 mg tablet Take 4 tablets (800 mg total) by mouth daily. 56 tablet 0    PLAVIX 75 mg tablet 1/2 tablet Orally Once a day      prochlorperazine (COMPAZINE) 10 MG tablet Take 1 tablet (10 mg total) by mouth every six (6) hours as needed (Nausea/Vomiting). 30 tablet 2    tamsulosin (FLOMAX) 0.4 mg capsule TAKE ONE CAPSULE BY MOUTH EVERY DAY 30 capsule 5    valACYclovir (VALTREX) 500 MG tablet TAKE 1 TABLET BY MOUTH DAILY 30 tablet 5    vardenafil (LEVITRA) 20 MG tablet Take 1 tablet (20 mg total) by mouth daily as needed for erectile dysfunction. 60 tablet 3    zinc gluconate 50 mg tablet Take 1 tablet (50 mg total) by mouth.       No current facility-administered medications for this visit.       LABS:  Lab Results   Component Value Date    WBC 32.0 (H) 09/11/2021    HGB 9.4 (L) 09/11/2021    HCT 29.4 (L) 09/11/2021    PLT 69 (L) 09/11/2021       Lab Results   Component Value Date    NA 142 11/08/2021    K 4.1 11/08/2021    CL 104 11/08/2021    CO2 23.8 11/08/2021    BUN 16 11/08/2021    CREATININE 0.81 11/08/2021    GLU 83 11/08/2021    CALCIUM 9.1 11/08/2021    MG 1.8 07/23/2021    PHOS 4.8 (H) 12/18/2011       Lab Results   Component Value Date    BILITOT 0.4 11/08/2021    PROT 6.9 11/08/2021    ALBUMIN  3.5 11/08/2021    ALT 22 11/08/2021    AST 17 11/08/2021    ALKPHOS 72 11/08/2021       Lab Results   Component Value Date    LABPROT 7.4 06/02/2019    INR 1.16 09/11/2021    APTT 24.0 08/24/2021       I spent *** minutes on the ***phone with the patient on the date of service. I spent an additional *** minutes on pre- and post-visit activities on the date of service.     The patient was physically located in [place] or a state in which I am permitted to provide care. The patient and/or parent/guardian understood that s/he may incur co-pays and cost sharing, and agreed to the telemedicine visit. The visit was reasonable and appropriate under the circumstances given the patient's presentation at the time.    The patient and/or parent/guardian has been advised of the potential risks and limitations of this mode of treatment (including, but not limited to, the absence of in-person examination) and has agreed to be treated using telemedicine. The patient's/patient's family's questions regarding telemedicine have been answered.     If the visit was completed in an ambulatory setting, the patient and/or parent/guardian has also been advised to contact their provider???s office for worsening conditions, and seek emergency medical treatment and/or call 911 if the patient deems either necessary.      Nicholos Johns, PharmD, CPP  Clinical Pharmacist Practitioner, [facility]  Pager: [phone]

## 2021-11-28 NOTE — Unmapped (Signed)
Specialty Medication(s): Votrient (pazopanib)    Stephen Perkins has been dis-enrolled from the [facility] specialty pharmacy services due to enrollment in a manufacturer assistance program that sends medicine directly to the patient.    Patient Assistance Program: [employer]  Coverage Dates: 11/17/21 through 05/13/22  The requested prescription above will be made available through the manufacturer's free drug program and will be shipped directly to the patient via the manufacturer's contracted specialty pharmacy.  [facility]  A representative from the manufacturer program will reach out to the patient to schedule a delivery.  Patient can follow up with manufacturer if needed via phone# @ [phone].    [name]  [facility] Specialty Pharmacist

## 2021-12-05 DIAGNOSIS — R0981 Nasal congestion: Secondary | ICD-10-CM | POA: Diagnosis not present

## 2021-12-05 DIAGNOSIS — R059 Cough, unspecified: Secondary | ICD-10-CM | POA: Diagnosis not present

## 2021-12-05 DIAGNOSIS — J069 Acute upper respiratory infection, unspecified: Secondary | ICD-10-CM | POA: Diagnosis not present

## 2021-12-06 ENCOUNTER — Ambulatory Visit: Admit: 2021-12-06 | Discharge: 2021-12-07 | Payer: MEDICARE

## 2021-12-06 DIAGNOSIS — Z85828 Personal history of other malignant neoplasm of skin: Secondary | ICD-10-CM | POA: Diagnosis not present

## 2021-12-06 DIAGNOSIS — L578 Other skin changes due to chronic exposure to nonionizing radiation: Secondary | ICD-10-CM | POA: Diagnosis not present

## 2021-12-06 DIAGNOSIS — L821 Other seborrheic keratosis: Secondary | ICD-10-CM | POA: Diagnosis not present

## 2021-12-08 ENCOUNTER — Ambulatory Visit: Admit: 2021-12-08 | Discharge: 2021-12-10 | Payer: MEDICARE

## 2021-12-08 DIAGNOSIS — I110 Hypertensive heart disease with heart failure: Secondary | ICD-10-CM | POA: Diagnosis not present

## 2021-12-08 DIAGNOSIS — R0602 Shortness of breath: Secondary | ICD-10-CM | POA: Diagnosis not present

## 2021-12-08 DIAGNOSIS — C499 Malignant neoplasm of connective and soft tissue, unspecified: Secondary | ICD-10-CM | POA: Diagnosis not present

## 2021-12-08 DIAGNOSIS — Z7902 Long term (current) use of antithrombotics/antiplatelets: Secondary | ICD-10-CM | POA: Diagnosis not present

## 2021-12-08 DIAGNOSIS — T451X5A Adverse effect of antineoplastic and immunosuppressive drugs, initial encounter: Secondary | ICD-10-CM | POA: Diagnosis not present

## 2021-12-08 DIAGNOSIS — R7881 Bacteremia: Secondary | ICD-10-CM | POA: Diagnosis not present

## 2021-12-08 DIAGNOSIS — C7802 Secondary malignant neoplasm of left lung: Secondary | ICD-10-CM | POA: Diagnosis not present

## 2021-12-08 DIAGNOSIS — E7800 Pure hypercholesterolemia, unspecified: Secondary | ICD-10-CM | POA: Diagnosis not present

## 2021-12-08 DIAGNOSIS — C7800 Secondary malignant neoplasm of unspecified lung: Secondary | ICD-10-CM | POA: Diagnosis not present

## 2021-12-08 DIAGNOSIS — R531 Weakness: Secondary | ICD-10-CM | POA: Diagnosis not present

## 2021-12-08 DIAGNOSIS — N400 Enlarged prostate without lower urinary tract symptoms: Secondary | ICD-10-CM | POA: Diagnosis not present

## 2021-12-08 DIAGNOSIS — I509 Heart failure, unspecified: Secondary | ICD-10-CM | POA: Diagnosis not present

## 2021-12-08 DIAGNOSIS — E876 Hypokalemia: Secondary | ICD-10-CM | POA: Diagnosis not present

## 2021-12-08 DIAGNOSIS — R54 Age-related physical debility: Secondary | ICD-10-CM | POA: Diagnosis not present

## 2021-12-08 DIAGNOSIS — Z609 Problem related to social environment, unspecified: Secondary | ICD-10-CM | POA: Diagnosis not present

## 2021-12-08 DIAGNOSIS — Z7901 Long term (current) use of anticoagulants: Secondary | ICD-10-CM | POA: Diagnosis not present

## 2021-12-08 DIAGNOSIS — Z20822 Contact with and (suspected) exposure to covid-19: Secondary | ICD-10-CM | POA: Diagnosis not present

## 2021-12-08 DIAGNOSIS — C7801 Secondary malignant neoplasm of right lung: Secondary | ICD-10-CM | POA: Diagnosis not present

## 2021-12-09 DIAGNOSIS — E876 Hypokalemia: Secondary | ICD-10-CM | POA: Diagnosis not present

## 2021-12-09 DIAGNOSIS — C499 Malignant neoplasm of connective and soft tissue, unspecified: Secondary | ICD-10-CM | POA: Diagnosis not present

## 2021-12-09 DIAGNOSIS — R54 Age-related physical debility: Secondary | ICD-10-CM | POA: Diagnosis not present

## 2021-12-09 DIAGNOSIS — C7800 Secondary malignant neoplasm of unspecified lung: Secondary | ICD-10-CM | POA: Diagnosis not present

## 2021-12-10 DIAGNOSIS — C3490 Malignant neoplasm of unspecified part of unspecified bronchus or lung: Secondary | ICD-10-CM | POA: Diagnosis not present

## 2021-12-10 DIAGNOSIS — N400 Enlarged prostate without lower urinary tract symptoms: Secondary | ICD-10-CM | POA: Diagnosis not present

## 2021-12-10 DIAGNOSIS — G893 Neoplasm related pain (acute) (chronic): Secondary | ICD-10-CM | POA: Diagnosis not present

## 2021-12-10 DIAGNOSIS — R54 Age-related physical debility: Secondary | ICD-10-CM | POA: Diagnosis not present

## 2021-12-12 ENCOUNTER — Telehealth: Admit: 2021-12-12 | Discharge: 2021-12-13 | Payer: MEDICARE

## 2021-12-12 DIAGNOSIS — C499 Malignant neoplasm of connective and soft tissue, unspecified: Secondary | ICD-10-CM | POA: Diagnosis not present

## 2021-12-12 DIAGNOSIS — R5382 Chronic fatigue, unspecified: Secondary | ICD-10-CM | POA: Diagnosis not present

## 2021-12-12 DIAGNOSIS — R64 Cachexia: Principal | ICD-10-CM

## 2021-12-12 DIAGNOSIS — I712 Thoracic aortic aneurysm, without rupture, unspecified: Secondary | ICD-10-CM | POA: Diagnosis not present

## 2021-12-13 DIAGNOSIS — C499 Malignant neoplasm of connective and soft tissue, unspecified: Principal | ICD-10-CM

## 2021-12-13 DIAGNOSIS — Z79899 Other long term (current) drug therapy: Principal | ICD-10-CM

## 2021-12-15 DIAGNOSIS — C499 Malignant neoplasm of connective and soft tissue, unspecified: Secondary | ICD-10-CM | POA: Diagnosis not present

## 2021-12-15 DIAGNOSIS — Z79899 Other long term (current) drug therapy: Secondary | ICD-10-CM | POA: Diagnosis not present

## 2021-12-22 ENCOUNTER — Other Ambulatory Visit: Admit: 2021-12-22 | Discharge: 2021-12-23 | Payer: MEDICARE

## 2021-12-22 ENCOUNTER — Ambulatory Visit: Admit: 2021-12-22 | Discharge: 2021-12-23 | Payer: MEDICARE

## 2021-12-22 DIAGNOSIS — R64 Cachexia: Secondary | ICD-10-CM | POA: Diagnosis not present

## 2021-12-22 DIAGNOSIS — Z96641 Presence of right artificial hip joint: Secondary | ICD-10-CM | POA: Diagnosis not present

## 2021-12-22 DIAGNOSIS — I209 Angina pectoris, unspecified: Secondary | ICD-10-CM | POA: Diagnosis not present

## 2021-12-22 DIAGNOSIS — C499 Malignant neoplasm of connective and soft tissue, unspecified: Secondary | ICD-10-CM | POA: Diagnosis not present

## 2021-12-22 DIAGNOSIS — I25118 Atherosclerotic heart disease of native coronary artery with other forms of angina pectoris: Secondary | ICD-10-CM | POA: Diagnosis not present

## 2021-12-22 DIAGNOSIS — C7802 Secondary malignant neoplasm of left lung: Secondary | ICD-10-CM | POA: Diagnosis not present

## 2021-12-22 DIAGNOSIS — I480 Paroxysmal atrial fibrillation: Secondary | ICD-10-CM | POA: Diagnosis not present

## 2021-12-22 DIAGNOSIS — R5382 Chronic fatigue, unspecified: Secondary | ICD-10-CM | POA: Diagnosis not present

## 2021-12-22 DIAGNOSIS — Z951 Presence of aortocoronary bypass graft: Secondary | ICD-10-CM | POA: Diagnosis not present

## 2021-12-22 DIAGNOSIS — R4189 Other symptoms and signs involving cognitive functions and awareness: Secondary | ICD-10-CM | POA: Diagnosis not present

## 2021-12-22 DIAGNOSIS — C7801 Secondary malignant neoplasm of right lung: Secondary | ICD-10-CM | POA: Diagnosis not present

## 2021-12-22 DIAGNOSIS — G893 Neoplasm related pain (acute) (chronic): Secondary | ICD-10-CM | POA: Diagnosis not present

## 2021-12-22 DIAGNOSIS — I712 Thoracic aortic aneurysm without rupture, unspecified part (CMS-HCC): Principal | ICD-10-CM

## 2021-12-22 DIAGNOSIS — Z89619 Acquired absence of unspecified leg above knee: Principal | ICD-10-CM

## 2021-12-22 DIAGNOSIS — Z79899 Other long term (current) drug therapy: Secondary | ICD-10-CM | POA: Diagnosis not present

## 2022-01-02 ENCOUNTER — Ambulatory Visit: Admit: 2022-01-02 | Discharge: 2022-01-03 | Payer: MEDICARE

## 2022-01-02 DIAGNOSIS — C499 Malignant neoplasm of connective and soft tissue, unspecified: Secondary | ICD-10-CM | POA: Diagnosis not present

## 2022-01-02 DIAGNOSIS — R4189 Other symptoms and signs involving cognitive functions and awareness: Secondary | ICD-10-CM | POA: Diagnosis not present

## 2022-01-03 ENCOUNTER — Encounter: Payer: Self-pay | Admitting: Cardiology

## 2022-01-03 ENCOUNTER — Ambulatory Visit (INDEPENDENT_AMBULATORY_CARE_PROVIDER_SITE_OTHER): Payer: Medicare Other | Admitting: Cardiology

## 2022-01-03 VITALS — BP 122/84 | HR 68 | Ht 67.0 in | Wt 160.6 lb

## 2022-01-03 DIAGNOSIS — I480 Paroxysmal atrial fibrillation: Secondary | ICD-10-CM | POA: Diagnosis not present

## 2022-01-03 DIAGNOSIS — I25119 Atherosclerotic heart disease of native coronary artery with unspecified angina pectoris: Secondary | ICD-10-CM | POA: Diagnosis not present

## 2022-01-03 NOTE — Patient Instructions (Addendum)
Medication Instructions:   Your physician recommends that you continue on your current medications as directed. Please refer to the Current Medication list given to you today.  Labwork:  none  Testing/Procedures:  none  Follow-Up:  Your physician recommends that you schedule a follow-up appointment in: 3 months.  Any Other Special Instructions Will Be Listed Below (If Applicable).  If you need a refill on your cardiac medications before your next appointment, please call your pharmacy. 

## 2022-01-03 NOTE — Progress Notes (Signed)
Cardiology Office Note  Date: 01/03/2022   ID: Eric Sells., DOB 1941-04-16, MRN [id number]  PCP:  Celene Squibb, MD  Cardiologist:  Rozann Lesches, MD Electrophysiologist:  None   Chief Complaint  Patient presents with   Cardiac follow-up    History of Present Illness: Eric Beltran. is an 81 y.o. male last seen in May.  I reviewed subsequent hospital stay.  He was treated for bacteremia that was felt to be catheter related (Staphylococcus hominis and Staphylococcus epidermidis).  Additional problems included acute hypoxic respiratory failure due to hypersensitivity pneumonitis and paroxysmal atrial fibrillation/flutter with intermittent RVR.  He was taken off amiodarone due to concurrent pneumonitis.  Follow-up echocardiogram is noted below.  He is here today for routine visit.  Reports an occasional sense of palpitations, but is in sinus rhythm today by ECG.  I went over his present regimen noted below.  He remains on Eliquis for stroke prophylaxis with atrial fibrillation/flutter.  He has a video conference with his oncologist at [facility] later this week to discuss next step in his treatment.  Also had a recent brain MRI with results pending.  He describes occasional angina symptoms and nitroglycerin use, not progressive in the last several weeks.  Past Medical History:  Diagnosis Date   Arthritis    Ascending aortic aneurysm ([place])    Status post repair 12/2011 at [facility]   BPH (benign prostatic hyperplasia)    Chronic diastolic CHF (congestive heart failure) (HCC)    Coronary atherosclerosis of native coronary artery    a. Multivessel status post prior stenting and ultimately CABG 12/2011 at [facility] with with LIMA to LAD, SVG to PLB, SVG to ramus, SVG to OM . b. Last cath 06/2014 (following ischemic nuc) -> medical therapy, no feasible way to revascularize LCx territory.   Essential hypertension    Mixed hyperlipidemia    Myocardial infarction [facility])    PAD  (peripheral artery disease) (HCC)    PAF (paroxysmal atrial fibrillation) ([place])    Supraventricular tachycardia ([place])     Past Surgical History:  Procedure Laterality Date   ASCENDING AORTIC ANEURYSM REPAIR  12/2011 [facility]   26 mm.Dacron graft   CORONARY ARTERY BYPASS GRAFT  12/2011 [facility]   LIMA to LAD, SVG to PLB, SVG to ramus, SVG to OM   CORONARY ARTERY BYPASS GRAFT N/A 08/06/2017   Procedure: REDO CORONARY ARTERY BYPASS GRAFTING (CABG) TIMES FOUR UTILIZING LEFT GREATER SAPHENOUS VEIN HAVESTED ENDOVASCULARLY, LEFT RADIAL ARTERY.  RIGHT AXILLARY CANNULATION;  Surgeon: Gaye Pollack, MD;  Location: [place];  Service: Open Heart Surgery;  Laterality: N/A;   CORONARY ATHERECTOMY N/A 02/10/2020   Procedure: CORONARY ATHERECTOMY;  Surgeon: [name], MD;  Location: [place] CV LAB;  Service: Cardiovascular;  Laterality: N/A;   CORONARY BALLOON ANGIOPLASTY N/A 06/10/2020   Procedure: CORONARY BALLOON ANGIOPLASTY;  Surgeon: Wellington Hampshire, MD;  Location: [place] CV LAB;  Service: Cardiovascular;  Laterality: N/A;   CORONARY STENT INTERVENTION N/A 02/10/2020   Procedure: CORONARY STENT INTERVENTION;  Surgeon: [name], MD;  Location: [place] CV LAB;  Service: Cardiovascular;  Laterality: N/A;   CORONARY STENT INTERVENTION N/A 07/26/2021   Procedure: CORONARY STENT INTERVENTION;  Surgeon: Wellington Hampshire, MD;  Location: [place] CV LAB;  Service: Cardiovascular;  Laterality: N/A;  RCA   HERNIA REPAIR     INTRAVASCULAR ULTRASOUND/IVUS N/A 07/26/2021   Procedure: Intravascular Ultrasound/IVUS;  Surgeon: Wellington Hampshire, MD;  Location:  [place] INVASIVE CV LAB;  Service: Cardiovascular;  Laterality: N/A;  OCT   IR REMOVAL TUN CV CATH W/O FL  09/19/2021   LEFT HEART CATH AND CORS/GRAFTS ANGIOGRAPHY N/A 07/11/2017   Procedure: LEFT HEART CATH AND CORS/GRAFTS ANGIOGRAPHY;  Surgeon: Troy Sine, MD;  Location: [place] CV LAB;  Service: Cardiovascular;  Laterality: N/A;   LEFT  HEART CATH AND CORS/GRAFTS ANGIOGRAPHY N/A 09/12/2018   Procedure: LEFT HEART CATH AND CORS/GRAFTS ANGIOGRAPHY;  Surgeon: Troy Sine, MD;  Location: [place] CV LAB;  Service: Cardiovascular;  Laterality: N/A;   LEFT HEART CATH AND CORS/GRAFTS ANGIOGRAPHY N/A 02/10/2020   Procedure: LEFT HEART CATH AND CORS/GRAFTS ANGIOGRAPHY;  Surgeon: [name], MD;  Location: [place] CV LAB;  Service: Cardiovascular;  Laterality: N/A;   LEFT HEART CATH AND CORS/GRAFTS ANGIOGRAPHY N/A 06/10/2020   Procedure: LEFT HEART CATH AND CORS/GRAFTS ANGIOGRAPHY;  Surgeon: Wellington Hampshire, MD;  Location: [place] CV LAB;  Service: Cardiovascular;  Laterality: N/A;   LEFT HEART CATH AND CORS/GRAFTS ANGIOGRAPHY N/A 07/26/2021   Procedure: LEFT HEART CATH AND CORS/GRAFTS ANGIOGRAPHY;  Surgeon: Wellington Hampshire, MD;  Location: [place] CV LAB;  Service: Cardiovascular;  Laterality: N/A;   LEFT HEART CATHETERIZATION WITH CORONARY ANGIOGRAM N/A 06/18/2014   Procedure: LEFT HEART CATHETERIZATION WITH CORONARY ANGIOGRAM;  Surgeon: Blane Ohara, MD;  Location: [facility] CATH LAB;  Service: Cardiovascular;  Laterality: N/A;   RADIAL ARTERY HARVEST Left 08/06/2017   Procedure: RADIAL ARTERY HARVEST;  Surgeon: Gaye Pollack, MD;  Location: [place];  Service: Open Heart Surgery;  Laterality: Left;   TEE WITHOUT CARDIOVERSION N/A 08/06/2017   Procedure: TRANSESOPHAGEAL ECHOCARDIOGRAM (TEE);  Surgeon: Gaye Pollack, MD;  Location: [place];  Service: Open Heart Surgery;  Laterality: N/A;   TOTAL HIP ARTHROPLASTY Right 02/07/2016   Procedure: RIGHT TOTAL HIP ARTHROPLASTY ANTERIOR APPROACH;  Surgeon: Mcarthur Rossetti, MD;  Location: [place];  Service: Orthopedics;  Laterality: Right;   TRANSURETHRAL RESECTION OF PROSTATE      Current Outpatient Medications  Medication Sig Dispense Refill   Ascorbic Acid (VITAMIN C) 500 MG CAPS Take 1 tablet by mouth daily.     atorvastatin (LIPITOR) 80 MG tablet Take 1 tablet (80 mg  total) by mouth at bedtime. 30 tablet 6   clopidogrel (PLAVIX) 75 MG tablet TAKE 1 TABLET BY MOUTH DAILY WITH BREAKFAST 90 tablet 2   diltiazem (CARDIZEM CD) 240 MG 24 hr capsule TAKE ONE CAPSULE BY MOUTH DAILY 90 capsule 2   furosemide (LASIX) 20 MG tablet Take 2 tablets (40 mg total) by mouth daily as needed for edema or fluid. 180 tablet 3   glipiZIDE (GLUCOTROL) 5 MG tablet Take 1 tablet (5 mg total) by mouth daily before breakfast. 30 tablet 0   isosorbide mononitrate (IMDUR) 60 MG 24 hr tablet TAKE 1 TABLET BY MOUTH TWICE DAILY every morning AND in THE evening 60 tablet 5   metFORMIN (GLUCOPHAGE) 500 MG tablet Take 1 tablet (500 mg total) by mouth 2 (two) times daily with a meal. 60 tablet 0   Multiple Vitamin (MULTIVITAMIN) tablet Take 1 tablet by mouth at bedtime.     nitroGLYCERIN (NITROSTAT) 0.4 MG SL tablet DISSOLVE 1 TABLET UNDER THE TONGUE EVERY 5 MINUTES AS NEEDED FOR CHEST PAIN. DO NOT EXCEED A TOTAL OF 3 DOSES IN 15 MINUTES. (Patient taking differently: Place 0.4 mg under the tongue every 5 (five) minutes as needed for chest pain.) 25 tablet 3  Omega-3 Fatty Acids (FISH OIL) 1000 MG CAPS Take 2,000 mg by mouth daily.     tamsulosin (FLOMAX) 0.4 MG CAPS capsule Take 0.4 mg by mouth at bedtime.     valACYclovir (VALTREX) 500 MG tablet Take 500 mg by mouth daily as needed (fever blisters).      zinc gluconate 50 MG tablet Take 50 mg by mouth at bedtime.     apixaban (ELIQUIS) 5 MG TABS tablet TAKE 1 TABLET BY MOUTH TWICE DAILY (Patient taking differently: Take 2.5 mg by mouth 2 (two) times daily.) 180 tablet 3   No current facility-administered medications for this visit.   Allergies:  Patient has no known allergies.   ROS: No orthopnea or PND.  Physical Exam: VS:  BP 122/84 (BP Location: Left Arm, Patient Position: Sitting, Cuff Size: Normal)   Pulse 68   Ht '5\' 7"'$  (1.702 m)   Wt 160 lb 9.6 oz (72.8 kg)   SpO2 96%   BMI 25.15 kg/m , BMI Body mass index is 25.15 kg/m.  Wt  Readings from Last 3 Encounters:  01/03/22 160 lb 9.6 oz (72.8 kg)  10/24/21 175 lb 3.2 oz (79.5 kg)  09/25/21 166 lb 10.7 oz (75.6 kg)    General: Patient appears comfortable at rest. HEENT: Conjunctiva and lids normal. Neck: Supple, no elevated JVP or carotid bruits, no thyromegaly. Lungs: Clear to auscultation, nonlabored breathing at rest. Cardiac: Regular rate and rhythm, no S3, 1/6 systolic murmur, 3/6 diastolic murmur, no pericardial rub. Extremities: Status post right AKA with prosthesis in place.  ECG:  An ECG dated 09/21/2021 was personally reviewed today and demonstrated:  Wide complex tachycardia, likely atrial flutter with 2:1 block and IVCD with right bundle branch block.  Recent Labwork: 09/16/2021: B Natriuretic Peptide 280.3 09/21/2021: ALT 59; AST 76 09/25/2021: BUN 20; Creatinine, Ser 0.99; Hemoglobin 8.8; Magnesium 2.2; Platelets 461; Potassium 4.1; Sodium 137     Component Value Date/Time   CHOL 128 07/26/2021 1115   TRIG 78 07/26/2021 1115   HDL 37 (L) 07/26/2021 1115   CHOLHDL 3.5 07/26/2021 1115   VLDL 16 07/26/2021 1115   LDLCALC 75 07/26/2021 1115    Other Studies Reviewed Today:  Echocardiogram 09/18/2021:  1. Left ventricular ejection fraction, by estimation, is 60 to 65%. The  left ventricle has normal function. The left ventricle has no regional  wall motion abnormalities. There is severe left ventricular hypertrophy of  the septal segment. Left  ventricular diastolic parameters are consistent with Grade I diastolic  dysfunction (impaired relaxation).   2. Right ventricular systolic function is normal. The right ventricular  size is normal. There is moderately elevated pulmonary artery systolic  pressure. The estimated right ventricular systolic pressure is 71.0 mmHg.   3. Left atrial size was moderately dilated.   4. The mitral valve is normal in structure. Trivial mitral valve  regurgitation. No evidence of mitral stenosis.   5. The aortic valve is  normal in structure. Aortic valve regurgitation is  mild. No aortic stenosis is present.   6. Aortic dilatation noted. There is mild dilatation of the aortic root,  measuring 44 mm. There is moderate dilatation of the ascending aorta,  measuring 47 mm.   7. The inferior vena cava is normal in size with greater than 50%  respiratory variability, suggesting right atrial pressure of 3 mmHg.   Assessment and Plan:  1.  Multivessel CAD status post CABG in 2013 with concurrent repair of ascending aortic aneurysm at [facility]. He underwent redo operation in May 2019 in the setting of graft failure with placement of radial to the diagonal, SVG to OM, and SVG to PDA and PL 2.  Since that time he has had further graft failure and revascularization of the OM and RCA distributions.  Status post DES x2 to the RCA in September 2021, and angioplasty of same region due to in-stent restenosis in January of this year.  Most recently underwent placement of DES to the distal RCA in March.  He reports occasional angina symptoms without recent progression.  Plan to continue medical therapy which now includes Plavix, Cardizem CD, Imdur, and Lipitor.  He has as needed nitroglycerin available.  2.  Paroxysmal atrial fibrillation/flutter.  Remains on Eliquis for stroke prophylaxis with CHA2DS2-VASc score of 5.  He is on Cardizem CD, was taken off amiodarone during hospital stay due to pneumonitis in the setting of sepsis and chemotherapy.  3.  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  Continues to follow with oncology at [facility].  Medication Adjustments/Labs and Tests Ordered: Current medicines are reviewed at length with the patient today.  Concerns regarding medicines are outlined above.   Tests Ordered: Orders Placed This Encounter  Procedures   EKG 12-Lead    Medication Changes: No orders of the defined types were placed  in this encounter.   Disposition:  Follow up  3 months.  Signed, Satira Sark, MD, [facility] 01/03/2022 4:47 PM    [facility] [place], [place], [place] [address] Phone: [phone]; Fax: [phone]

## 2022-01-05 ENCOUNTER — Encounter: Admit: 2022-01-05 | Discharge: 2022-01-05 | Payer: MEDICARE

## 2022-01-05 ENCOUNTER — Telehealth: Admit: 2022-01-05 | Discharge: 2022-01-05 | Payer: MEDICARE

## 2022-01-05 ENCOUNTER — Ambulatory Visit: Admit: 2022-01-05 | Discharge: 2022-01-05 | Payer: MEDICARE

## 2022-01-05 DIAGNOSIS — E032 Hypothyroidism due to medicaments and other exogenous substances: Secondary | ICD-10-CM | POA: Diagnosis not present

## 2022-01-05 DIAGNOSIS — C499 Malignant neoplasm of connective and soft tissue, unspecified: Secondary | ICD-10-CM | POA: Diagnosis not present

## 2022-01-05 DIAGNOSIS — Z79899 Other long term (current) drug therapy: Secondary | ICD-10-CM | POA: Diagnosis not present

## 2022-01-05 DIAGNOSIS — I110 Hypertensive heart disease with heart failure: Secondary | ICD-10-CM | POA: Diagnosis not present

## 2022-01-05 DIAGNOSIS — I509 Heart failure, unspecified: Secondary | ICD-10-CM | POA: Diagnosis not present

## 2022-01-11 ENCOUNTER — Telehealth: Admit: 2022-01-11 | Discharge: 2022-01-12 | Payer: MEDICARE

## 2022-01-11 DIAGNOSIS — C499 Malignant neoplasm of connective and soft tissue, unspecified: Principal | ICD-10-CM

## 2022-01-19 ENCOUNTER — Telehealth: Admit: 2022-01-19 | Discharge: 2022-01-19 | Payer: MEDICARE

## 2022-01-19 ENCOUNTER — Ambulatory Visit: Admit: 2022-01-19 | Discharge: 2022-01-19 | Payer: MEDICARE

## 2022-01-19 ENCOUNTER — Encounter: Admit: 2022-01-19 | Discharge: 2022-01-19 | Payer: MEDICARE

## 2022-01-19 DIAGNOSIS — I25118 Atherosclerotic heart disease of native coronary artery with other forms of angina pectoris: Secondary | ICD-10-CM | POA: Diagnosis not present

## 2022-01-19 DIAGNOSIS — C7802 Secondary malignant neoplasm of left lung: Principal | ICD-10-CM

## 2022-01-19 DIAGNOSIS — C7801 Secondary malignant neoplasm of right lung: Principal | ICD-10-CM

## 2022-01-19 DIAGNOSIS — I712 Thoracic aortic aneurysm without rupture, unspecified part (CMS-HCC): Principal | ICD-10-CM

## 2022-01-19 DIAGNOSIS — I209 Angina pectoris, unspecified: Principal | ICD-10-CM

## 2022-01-19 DIAGNOSIS — C499 Malignant neoplasm of connective and soft tissue, unspecified: Principal | ICD-10-CM

## 2022-01-19 DIAGNOSIS — I480 Paroxysmal atrial fibrillation: Principal | ICD-10-CM

## 2022-01-19 DIAGNOSIS — Z951 Presence of aortocoronary bypass graft: Principal | ICD-10-CM

## 2022-01-19 DIAGNOSIS — Z79899 Other long term (current) drug therapy: Principal | ICD-10-CM

## 2022-01-19 DIAGNOSIS — Z89619 Acquired absence of unspecified leg above knee: Principal | ICD-10-CM

## 2022-01-19 DIAGNOSIS — G893 Neoplasm related pain (acute) (chronic): Principal | ICD-10-CM

## 2022-01-25 ENCOUNTER — Telehealth: Admit: 2022-01-25 | Discharge: 2022-01-26 | Payer: MEDICARE

## 2022-01-25 DIAGNOSIS — C499 Malignant neoplasm of connective and soft tissue, unspecified: Principal | ICD-10-CM

## 2022-01-25 MED ORDER — OLANZAPINE 5 MG TABLET
ORAL_TABLET | Freq: Every evening | ORAL | 2 refills | 30 days | Status: CP
Start: 2022-01-25 — End: 2023-01-25

## 2022-01-25 MED ORDER — LOSARTAN 50 MG TABLET
ORAL_TABLET | Freq: Every day | ORAL | 1 refills | 30 days | Status: CP
Start: 2022-01-25 — End: 2023-01-25

## 2022-01-29 ENCOUNTER — Ambulatory Visit: Admit: 2022-01-29 | Discharge: 2022-01-30 | Payer: MEDICARE

## 2022-01-31 ENCOUNTER — Ambulatory Visit: Admit: 2022-01-31 | Discharge: 2022-02-01 | Payer: MEDICARE

## 2022-01-31 ENCOUNTER — Encounter: Admit: 2022-01-31 | Discharge: 2022-01-31 | Payer: MEDICARE

## 2022-01-31 DIAGNOSIS — C499 Malignant neoplasm of connective and soft tissue, unspecified: Secondary | ICD-10-CM | POA: Diagnosis not present

## 2022-02-01 ENCOUNTER — Telehealth: Admit: 2022-02-01 | Discharge: 2022-02-02 | Payer: MEDICARE

## 2022-02-01 DIAGNOSIS — T451X5A Adverse effect of antineoplastic and immunosuppressive drugs, initial encounter: Principal | ICD-10-CM

## 2022-02-01 DIAGNOSIS — C499 Malignant neoplasm of connective and soft tissue, unspecified: Principal | ICD-10-CM

## 2022-02-01 DIAGNOSIS — R110 Nausea: Principal | ICD-10-CM

## 2022-02-01 DIAGNOSIS — R630 Anorexia: Principal | ICD-10-CM

## 2022-02-01 MED ORDER — OLANZAPINE 5 MG TABLET
ORAL_TABLET | Freq: Every evening | ORAL | 2 refills | 30 days | Status: CP
Start: 2022-02-01 — End: 2023-02-01

## 2022-02-08 ENCOUNTER — Ambulatory Visit: Admit: 2022-02-08 | Discharge: 2022-02-08 | Payer: MEDICARE

## 2022-02-08 ENCOUNTER — Institutional Professional Consult (permissible substitution): Admit: 2022-02-08 | Discharge: 2022-02-08 | Payer: MEDICARE

## 2022-02-08 ENCOUNTER — Encounter: Admit: 2022-02-08 | Discharge: 2022-02-08 | Payer: MEDICARE

## 2022-02-08 DIAGNOSIS — Z79899 Other long term (current) drug therapy: Secondary | ICD-10-CM | POA: Diagnosis not present

## 2022-02-08 DIAGNOSIS — C499 Malignant neoplasm of connective and soft tissue, unspecified: Secondary | ICD-10-CM | POA: Diagnosis not present

## 2022-02-09 ENCOUNTER — Telehealth: Admit: 2022-02-09 | Discharge: 2022-02-10 | Payer: MEDICARE

## 2022-02-09 DIAGNOSIS — C7802 Secondary malignant neoplasm of left lung: Secondary | ICD-10-CM | POA: Diagnosis not present

## 2022-02-09 DIAGNOSIS — I712 Thoracic aortic aneurysm without rupture, unspecified part (CMS-HCC): Principal | ICD-10-CM

## 2022-02-09 DIAGNOSIS — I209 Angina pectoris, unspecified: Secondary | ICD-10-CM | POA: Diagnosis not present

## 2022-02-09 DIAGNOSIS — C7801 Secondary malignant neoplasm of right lung: Principal | ICD-10-CM

## 2022-02-09 DIAGNOSIS — Z951 Presence of aortocoronary bypass graft: Principal | ICD-10-CM

## 2022-02-09 DIAGNOSIS — Z89619 Acquired absence of unspecified leg above knee: Principal | ICD-10-CM

## 2022-02-09 DIAGNOSIS — I480 Paroxysmal atrial fibrillation: Principal | ICD-10-CM

## 2022-02-09 DIAGNOSIS — G893 Neoplasm related pain (acute) (chronic): Principal | ICD-10-CM

## 2022-02-09 DIAGNOSIS — C499 Malignant neoplasm of connective and soft tissue, unspecified: Principal | ICD-10-CM

## 2022-02-09 MED ORDER — LOSARTAN 50 MG TABLET
ORAL_TABLET | Freq: Every day | ORAL | 2 refills | 15 days | Status: CP
Start: 2022-02-09 — End: 2023-02-09

## 2022-02-12 DIAGNOSIS — C499 Malignant neoplasm of connective and soft tissue, unspecified: Secondary | ICD-10-CM | POA: Diagnosis not present

## 2022-02-12 DIAGNOSIS — I2089 Other forms of angina pectoris: Secondary | ICD-10-CM | POA: Diagnosis not present

## 2022-02-12 DIAGNOSIS — Z89611 Acquired absence of right leg above knee: Secondary | ICD-10-CM | POA: Diagnosis not present

## 2022-02-12 DIAGNOSIS — R7301 Impaired fasting glucose: Secondary | ICD-10-CM | POA: Diagnosis not present

## 2022-02-19 ENCOUNTER — Other Ambulatory Visit: Payer: Self-pay | Admitting: Cardiology

## 2022-02-22 ENCOUNTER — Telehealth: Admit: 2022-02-22 | Discharge: 2022-02-23 | Payer: MEDICARE

## 2022-02-22 DIAGNOSIS — C499 Malignant neoplasm of connective and soft tissue, unspecified: Principal | ICD-10-CM

## 2022-02-22 MED ORDER — SPIRONOLACTONE 25 MG TABLET
ORAL_TABLET | Freq: Every day | ORAL | 5 refills | 30 days | Status: CP
Start: 2022-02-22 — End: 2023-02-22

## 2022-02-25 DIAGNOSIS — I10 Essential (primary) hypertension: Secondary | ICD-10-CM | POA: Diagnosis not present

## 2022-02-25 DIAGNOSIS — Z85828 Personal history of other malignant neoplasm of skin: Secondary | ICD-10-CM | POA: Diagnosis not present

## 2022-02-25 DIAGNOSIS — R2232 Localized swelling, mass and lump, left upper limb: Secondary | ICD-10-CM | POA: Diagnosis not present

## 2022-02-26 ENCOUNTER — Ambulatory Visit
Admit: 2022-02-26 | Discharge: 2022-02-26 | Disposition: A | Discharge status: Home / Self Care | Payer: MEDICARE | Attending: Physician Assistant

## 2022-02-26 DIAGNOSIS — C7802 Secondary malignant neoplasm of left lung: Principal | ICD-10-CM

## 2022-02-26 DIAGNOSIS — C499 Malignant neoplasm of connective and soft tissue, unspecified: Principal | ICD-10-CM

## 2022-02-28 DIAGNOSIS — C499 Malignant neoplasm of connective and soft tissue, unspecified: Principal | ICD-10-CM

## 2022-02-28 MED ORDER — PAZOPANIB 200 MG TABLET
ORAL_TABLET | Freq: Every day | ORAL | 6 refills | 30.00000 days | Status: CP
Start: 2022-02-28 — End: 2022-02-28

## 2022-03-01 ENCOUNTER — Ambulatory Visit: Admit: 2022-03-01 | Discharge: 2022-03-02 | Payer: MEDICARE

## 2022-03-02 DIAGNOSIS — C499 Malignant neoplasm of connective and soft tissue, unspecified: Principal | ICD-10-CM

## 2022-03-02 DIAGNOSIS — Z89611 Acquired absence of right leg above knee: Principal | ICD-10-CM

## 2022-03-02 MED ORDER — TAMSULOSIN 0.4 MG CAPSULE
ORAL_CAPSULE | 2 refills | 0 days
Start: 2022-03-02 — End: ?

## 2022-03-06 MED ORDER — TAMSULOSIN 0.4 MG CAPSULE
ORAL_CAPSULE | 2 refills | 0 days | Status: CP
Start: 2022-03-06 — End: ?

## 2022-03-07 DIAGNOSIS — S134XXA Sprain of ligaments of cervical spine, initial encounter: Secondary | ICD-10-CM | POA: Diagnosis not present

## 2022-03-07 DIAGNOSIS — M9901 Segmental and somatic dysfunction of cervical region: Secondary | ICD-10-CM | POA: Diagnosis not present

## 2022-03-08 ENCOUNTER — Ambulatory Visit: Admit: 2022-03-08 | Discharge: 2022-03-08 | Payer: MEDICARE

## 2022-03-08 ENCOUNTER — Encounter: Admit: 2022-03-08 | Discharge: 2022-03-08 | Payer: MEDICARE

## 2022-03-08 ENCOUNTER — Telehealth: Admit: 2022-03-08 | Discharge: 2022-03-08 | Payer: MEDICARE

## 2022-03-08 DIAGNOSIS — N179 Acute kidney failure, unspecified: Secondary | ICD-10-CM | POA: Diagnosis not present

## 2022-03-08 DIAGNOSIS — C499 Malignant neoplasm of connective and soft tissue, unspecified: Secondary | ICD-10-CM | POA: Diagnosis not present

## 2022-03-08 MED ORDER — POLYETHYLENE GLYCOL 3350 17 GRAM ORAL POWDER PACKET
PACK | Freq: Every day | ORAL | 1 refills | 30 days | Status: CP
Start: 2022-03-08 — End: ?

## 2022-03-09 ENCOUNTER — Encounter: Admit: 2022-03-09 | Discharge: 2022-03-09 | Payer: MEDICARE

## 2022-03-09 ENCOUNTER — Ambulatory Visit: Admit: 2022-03-09 | Discharge: 2022-03-10 | Payer: MEDICARE

## 2022-03-09 DIAGNOSIS — N179 Acute kidney failure, unspecified: Secondary | ICD-10-CM | POA: Diagnosis not present

## 2022-03-12 MED ORDER — SPIRONOLACTONE 50 MG TABLET
ORAL_TABLET | Freq: Every day | ORAL | 1 refills | 30 days | Status: CP
Start: 2022-03-12 — End: 2023-03-12

## 2022-03-14 DIAGNOSIS — M9901 Segmental and somatic dysfunction of cervical region: Secondary | ICD-10-CM | POA: Diagnosis not present

## 2022-03-14 DIAGNOSIS — S134XXA Sprain of ligaments of cervical spine, initial encounter: Secondary | ICD-10-CM | POA: Diagnosis not present

## 2022-03-16 ENCOUNTER — Ambulatory Visit: Admit: 2022-03-16 | Discharge: 2022-03-17 | Payer: MEDICARE

## 2022-03-16 ENCOUNTER — Ambulatory Visit
Admit: 2022-03-16 | Discharge: 2022-03-17 | Payer: MEDICARE | Attending: Student in an Organized Health Care Education/Training Program | Primary: Student in an Organized Health Care Education/Training Program

## 2022-03-16 DIAGNOSIS — I480 Paroxysmal atrial fibrillation: Secondary | ICD-10-CM | POA: Diagnosis not present

## 2022-03-16 DIAGNOSIS — N2889 Other specified disorders of kidney and ureter: Secondary | ICD-10-CM | POA: Diagnosis not present

## 2022-03-16 DIAGNOSIS — I2510 Atherosclerotic heart disease of native coronary artery without angina pectoris: Secondary | ICD-10-CM | POA: Diagnosis not present

## 2022-03-16 DIAGNOSIS — M898X8 Other specified disorders of bone, other site: Secondary | ICD-10-CM | POA: Diagnosis not present

## 2022-03-16 DIAGNOSIS — Z79899 Other long term (current) drug therapy: Secondary | ICD-10-CM | POA: Diagnosis not present

## 2022-03-16 DIAGNOSIS — C493 Malignant neoplasm of connective and soft tissue of thorax: Secondary | ICD-10-CM | POA: Diagnosis not present

## 2022-03-16 DIAGNOSIS — I10 Essential (primary) hypertension: Secondary | ICD-10-CM | POA: Diagnosis not present

## 2022-03-16 DIAGNOSIS — K769 Liver disease, unspecified: Secondary | ICD-10-CM | POA: Diagnosis not present

## 2022-03-16 DIAGNOSIS — R918 Other nonspecific abnormal finding of lung field: Secondary | ICD-10-CM | POA: Diagnosis not present

## 2022-03-16 DIAGNOSIS — C7802 Secondary malignant neoplasm of left lung: Secondary | ICD-10-CM | POA: Diagnosis not present

## 2022-03-16 DIAGNOSIS — Z955 Presence of coronary angioplasty implant and graft: Secondary | ICD-10-CM | POA: Diagnosis not present

## 2022-03-16 DIAGNOSIS — I252 Old myocardial infarction: Secondary | ICD-10-CM | POA: Diagnosis not present

## 2022-03-16 DIAGNOSIS — C499 Malignant neoplasm of connective and soft tissue, unspecified: Principal | ICD-10-CM

## 2022-03-16 DIAGNOSIS — C7801 Secondary malignant neoplasm of right lung: Principal | ICD-10-CM

## 2022-03-16 DIAGNOSIS — I712 Thoracic aortic aneurysm, without rupture, unspecified: Secondary | ICD-10-CM | POA: Diagnosis not present

## 2022-03-16 DIAGNOSIS — Z515 Encounter for palliative care: Secondary | ICD-10-CM | POA: Diagnosis not present

## 2022-03-19 ENCOUNTER — Ambulatory Visit: Admit: 2022-03-19 | Discharge: 2022-03-20 | Payer: MEDICARE

## 2022-03-19 DIAGNOSIS — C499 Malignant neoplasm of connective and soft tissue, unspecified: Secondary | ICD-10-CM | POA: Diagnosis not present

## 2022-03-19 DIAGNOSIS — M9901 Segmental and somatic dysfunction of cervical region: Secondary | ICD-10-CM | POA: Diagnosis not present

## 2022-03-19 DIAGNOSIS — R2232 Localized swelling, mass and lump, left upper limb: Secondary | ICD-10-CM | POA: Diagnosis not present

## 2022-03-19 DIAGNOSIS — S134XXA Sprain of ligaments of cervical spine, initial encounter: Secondary | ICD-10-CM | POA: Diagnosis not present

## 2022-03-19 DIAGNOSIS — S46212S Strain of muscle, fascia and tendon of other parts of biceps, left arm, sequela: Principal | ICD-10-CM

## 2022-03-19 DIAGNOSIS — S40022A Contusion of left upper arm, initial encounter: Principal | ICD-10-CM

## 2022-03-27 DIAGNOSIS — C499 Malignant neoplasm of connective and soft tissue, unspecified: Principal | ICD-10-CM

## 2022-03-28 ENCOUNTER — Ambulatory Visit: Admit: 2022-03-28 | Discharge: 2022-03-29 | Payer: MEDICARE

## 2022-03-28 DIAGNOSIS — R2232 Localized swelling, mass and lump, left upper limb: Secondary | ICD-10-CM | POA: Diagnosis not present

## 2022-03-29 ENCOUNTER — Ambulatory Visit: Admit: 2022-03-29 | Payer: MEDICARE

## 2022-04-02 ENCOUNTER — Ambulatory Visit: Admit: 2022-04-02 | Discharge: 2022-04-03 | Payer: MEDICARE

## 2022-04-04 ENCOUNTER — Other Ambulatory Visit: Payer: Self-pay | Admitting: Cardiology

## 2022-04-09 ENCOUNTER — Encounter: Payer: Self-pay | Admitting: Cardiology

## 2022-04-09 ENCOUNTER — Telehealth: Payer: Self-pay | Admitting: *Deleted

## 2022-04-09 ENCOUNTER — Ambulatory Visit: Payer: Medicare Other | Attending: Cardiology | Admitting: Cardiology

## 2022-04-09 VITALS — BP 156/72 | HR 42 | Ht 67.0 in | Wt 160.0 lb

## 2022-04-09 DIAGNOSIS — I10 Essential (primary) hypertension: Secondary | ICD-10-CM

## 2022-04-09 DIAGNOSIS — I25119 Atherosclerotic heart disease of native coronary artery with unspecified angina pectoris: Secondary | ICD-10-CM | POA: Diagnosis not present

## 2022-04-09 DIAGNOSIS — I480 Paroxysmal atrial fibrillation: Secondary | ICD-10-CM

## 2022-04-09 NOTE — Patient Instructions (Addendum)
Medication Instructions:  Your physician has recommended you make the following change in your medication:  Stop metoprolol Continue other medications the same  Labwork: Lab work at [facility] as planned  Testing/Procedures: none  Follow-Up: Your physician recommends that you schedule a follow-up appointment in: 6 weeks  Any Other Special Instructions Will Be Listed Below (If Applicable).  If you need a refill on your cardiac medications before your next appointment, please call your pharmacy.

## 2022-04-09 NOTE — Progress Notes (Signed)
Cardiology Office Note  Date: 04/09/2022   ID: Eric Beltran., DOB 1941/01/21, MRN [id number]  PCP:  Celene Squibb, MD  Cardiologist:  Rozann Lesches, MD Electrophysiologist:  None   Chief Complaint  Patient presents with   Cardiac follow-up    History of Present Illness: Eric Beltran. is an 81 y.o. male last seen in August.  He is here for a follow-up visit.  Since our last encounter, he has had difficulty with increasing blood pressure.  Medications have been adjusted through providers in [facility].  It looks like he was started on Aldactone at 25 mg daily and uptitrated to 50 mg daily.  We went over his medicines in detail today.  He has been more bradycardic, I personally reviewed his ECG today which shows marked sinus bradycardia at 38 bpm, low voltage and incomplete right bundle branch block, nonspecific ST segment changes.  He has had no syncope.  No worsening angina.  I reviewed his lab work from early November at which point potassium was 4.9, BUN 24, and creatinine 1.09.  LFTs were normal at that time.  We discussed stopping his Lopressor completely at this time.  He has lab work scheduled with oncology telehealth visit on Thursday of this week.  Past Medical History:  Diagnosis Date   Arthritis    Ascending aortic aneurysm ([place])    Status post repair 12/2011 at [facility]   BPH (benign prostatic hyperplasia)    Chronic diastolic CHF (congestive heart failure) (HCC)    Coronary atherosclerosis of native coronary artery    a. Multivessel status post prior stenting and ultimately CABG 12/2011 at [facility] with with LIMA to LAD, SVG to PLB, SVG to ramus, SVG to OM . b. Last cath 06/2014 (following ischemic nuc) -> medical therapy, no feasible way to revascularize LCx territory.   Essential hypertension    Mixed hyperlipidemia    Myocardial infarction [facility])    PAD (peripheral artery disease) (HCC)    PAF (paroxysmal atrial fibrillation) ([place])    Supraventricular  tachycardia     Past Surgical History:  Procedure Laterality Date   ASCENDING AORTIC ANEURYSM REPAIR  12/2011 [facility]   26 mm.Dacron graft   CORONARY ARTERY BYPASS GRAFT  12/2011 [facility]   LIMA to LAD, SVG to PLB, SVG to ramus, SVG to OM   CORONARY ARTERY BYPASS GRAFT N/A 08/06/2017   Procedure: REDO CORONARY ARTERY BYPASS GRAFTING (CABG) TIMES FOUR UTILIZING LEFT GREATER SAPHENOUS VEIN HAVESTED ENDOVASCULARLY, LEFT RADIAL ARTERY.  RIGHT AXILLARY CANNULATION;  Surgeon: Gaye Pollack, MD;  Location: [place];  Service: Open Heart Surgery;  Laterality: N/A;   CORONARY ATHERECTOMY N/A 02/10/2020   Procedure: CORONARY ATHERECTOMY;  Surgeon: [name], MD;  Location: [place] CV LAB;  Service: Cardiovascular;  Laterality: N/A;   CORONARY BALLOON ANGIOPLASTY N/A 06/10/2020   Procedure: CORONARY BALLOON ANGIOPLASTY;  Surgeon: Wellington Hampshire, MD;  Location: [place] CV LAB;  Service: Cardiovascular;  Laterality: N/A;   CORONARY STENT INTERVENTION N/A 02/10/2020   Procedure: CORONARY STENT INTERVENTION;  Surgeon: [name], MD;  Location: [place] CV LAB;  Service: Cardiovascular;  Laterality: N/A;   CORONARY STENT INTERVENTION N/A 07/26/2021   Procedure: CORONARY STENT INTERVENTION;  Surgeon: Wellington Hampshire, MD;  Location: [place] CV LAB;  Service: Cardiovascular;  Laterality: N/A;  RCA   HERNIA REPAIR     INTRAVASCULAR ULTRASOUND/IVUS N/A 07/26/2021   Procedure: Intravascular Ultrasound/IVUS;  Surgeon: Wellington Hampshire, MD;  Location: [place] CV LAB;  Service: Cardiovascular;  Laterality: N/A;  OCT   IR REMOVAL TUN CV CATH W/O FL  09/19/2021   LEFT HEART CATH AND CORS/GRAFTS ANGIOGRAPHY N/A 07/11/2017   Procedure: LEFT HEART CATH AND CORS/GRAFTS ANGIOGRAPHY;  Surgeon: Troy Sine, MD;  Location: [place] CV LAB;  Service: Cardiovascular;  Laterality: N/A;   LEFT HEART CATH AND CORS/GRAFTS ANGIOGRAPHY N/A 09/12/2018   Procedure: LEFT HEART CATH AND CORS/GRAFTS  ANGIOGRAPHY;  Surgeon: Troy Sine, MD;  Location: [place] CV LAB;  Service: Cardiovascular;  Laterality: N/A;   LEFT HEART CATH AND CORS/GRAFTS ANGIOGRAPHY N/A 02/10/2020   Procedure: LEFT HEART CATH AND CORS/GRAFTS ANGIOGRAPHY;  Surgeon: [name], MD;  Location: [place] CV LAB;  Service: Cardiovascular;  Laterality: N/A;   LEFT HEART CATH AND CORS/GRAFTS ANGIOGRAPHY N/A 06/10/2020   Procedure: LEFT HEART CATH AND CORS/GRAFTS ANGIOGRAPHY;  Surgeon: Wellington Hampshire, MD;  Location: [place] CV LAB;  Service: Cardiovascular;  Laterality: N/A;   LEFT HEART CATH AND CORS/GRAFTS ANGIOGRAPHY N/A 07/26/2021   Procedure: LEFT HEART CATH AND CORS/GRAFTS ANGIOGRAPHY;  Surgeon: Wellington Hampshire, MD;  Location: [place] CV LAB;  Service: Cardiovascular;  Laterality: N/A;   LEFT HEART CATHETERIZATION WITH CORONARY ANGIOGRAM N/A 06/18/2014   Procedure: LEFT HEART CATHETERIZATION WITH CORONARY ANGIOGRAM;  Surgeon: Blane Ohara, MD;  Location: [facility] CATH LAB;  Service: Cardiovascular;  Laterality: N/A;   RADIAL ARTERY HARVEST Left 08/06/2017   Procedure: RADIAL ARTERY HARVEST;  Surgeon: Gaye Pollack, MD;  Location: [place];  Service: Open Heart Surgery;  Laterality: Left;   TEE WITHOUT CARDIOVERSION N/A 08/06/2017   Procedure: TRANSESOPHAGEAL ECHOCARDIOGRAM (TEE);  Surgeon: Gaye Pollack, MD;  Location: [place];  Service: Open Heart Surgery;  Laterality: N/A;   TOTAL HIP ARTHROPLASTY Right 02/07/2016   Procedure: RIGHT TOTAL HIP ARTHROPLASTY ANTERIOR APPROACH;  Surgeon: Mcarthur Rossetti, MD;  Location: [place];  Service: Orthopedics;  Laterality: Right;   TRANSURETHRAL RESECTION OF PROSTATE      Current Outpatient Medications  Medication Sig Dispense Refill   amiodarone (PACERONE) 200 MG tablet Take 100 mg by mouth daily.     apixaban (ELIQUIS) 5 MG TABS tablet Take 5 mg by mouth 2 (two) times daily.     Ascorbic Acid (VITAMIN C) 500 MG CAPS Take 1 tablet by mouth daily.      atorvastatin (LIPITOR) 80 MG tablet TAKE 1 TABLET BY MOUTH AT BEDTIME 90 tablet 1   clopidogrel (PLAVIX) 75 MG tablet TAKE 1 TABLET BY MOUTH DAILY WITH BREAKFAST 90 tablet 2   diltiazem (CARDIZEM CD) 240 MG 24 hr capsule TAKE ONE CAPSULE BY MOUTH DAILY 90 capsule 2   furosemide (LASIX) 20 MG tablet Take 20 mg by mouth daily.     glipiZIDE (GLUCOTROL) 5 MG tablet Take 1 tablet (5 mg total) by mouth daily before breakfast. 30 tablet 0   isosorbide mononitrate (IMDUR) 60 MG 24 hr tablet TAKE 1 TABLET BY MOUTH TWICE DAILY every morning AND in THE evening 60 tablet 5   losartan (COZAAR) 50 MG tablet TAKE 2 TABLETS BY MOUTH EVERY DAY (Patient taking differently: Take 50 mg by mouth daily.) 60 tablet 5   metFORMIN (GLUCOPHAGE) 500 MG tablet Take 1 tablet (500 mg total) by mouth 2 (two) times daily with a meal. 60 tablet 0   Multiple Vitamin (MULTIVITAMIN) tablet Take 1 tablet by mouth at bedtime.     nitroGLYCERIN (NITROSTAT) 0.4 MG SL tablet  DISSOLVE 1 TABLET UNDER THE TONGUE EVERY 5 MINUTES AS NEEDED FOR CHEST PAIN. DO NOT EXCEED A TOTAL OF 3 DOSES IN 15 MINUTES. (Patient taking differently: Place 0.4 mg under the tongue every 5 (five) minutes as needed for chest pain.) 25 tablet 3   Omega-3 Fatty Acids (FISH OIL) 1000 MG CAPS Take 2,000 mg by mouth daily.     pazopanib (VOTRIENT) 200 MG tablet Take 400 mg by mouth daily.     spironolactone (ALDACTONE) 50 MG tablet Take 50 mg by mouth daily.     tamsulosin (FLOMAX) 0.4 MG CAPS capsule Take 0.4 mg by mouth at bedtime.     valACYclovir (VALTREX) 500 MG tablet Take 500 mg by mouth daily as needed (fever blisters).      zinc gluconate 50 MG tablet Take 50 mg by mouth at bedtime.     No current facility-administered medications for this visit.   Allergies:  Patient has no known allergies.   ROS: No syncope.  Physical Exam: VS:  BP (!) 156/72   Pulse (!) 42   Ht '5\' 7"'$  (1.702 m)   Wt 160 lb (72.6 kg)   SpO2 96%   BMI 25.06 kg/m , BMI Body mass  index is 25.06 kg/m.  Wt Readings from Last 3 Encounters:  04/09/22 160 lb (72.6 kg)  01/03/22 160 lb 9.6 oz (72.8 kg)  10/24/21 175 lb 3.2 oz (79.5 kg)    General: Patient appears comfortable at rest. HEENT: Conjunctiva and lids normal. Neck: Supple, no elevated JVP or carotid bruits. Lungs: Clear to auscultation, nonlabored breathing at rest. Cardiac: RRR with no gallop, 1/6 systolic murmur, 3/6 diastolic murmur as before. Abdomen: Soft, nontender, no hepatomegaly, bowel sounds present, no guarding or rebound. Extremities: Status post right AKA with prosthesis in place.  ECG:  An ECG dated 01/03/2022 was personally reviewed today and demonstrated:  Sinus rhythm with prolonged PR interval, incomplete right bundle branch block, nonspecific ST changes.  Recent Labwork: 09/16/2021: B Natriuretic Peptide 280.3 09/21/2021: ALT 59; AST 76 09/25/2021: BUN 20; Creatinine, Ser 0.99; Hemoglobin 8.8; Magnesium 2.2; Platelets 461; Potassium 4.1; Sodium 137     Component Value Date/Time   CHOL 128 07/26/2021 1115   TRIG 78 07/26/2021 1115   HDL 37 (L) 07/26/2021 1115   CHOLHDL 3.5 07/26/2021 1115   VLDL 16 07/26/2021 1115   LDLCALC 75 07/26/2021 1115  August 2023: Hemoglobin 15.6, platelets 311, BUN 12, creatinine 0.91, potassium 4.2, AST 18, ALT 28 January 2022: TSH 0.044 November 2023: Hemoglobin 12.8, platelets 237, potassium 4.9, BUN 24, creatinine 1.09, AST 18, ALT 18  Other Studies Reviewed Today:  Echocardiogram 09/18/2021:  1. Left ventricular ejection fraction, by estimation, is 60 to 65%. The  left ventricle has normal function. The left ventricle has no regional  wall motion abnormalities. There is severe left ventricular hypertrophy of  the septal segment. Left  ventricular diastolic parameters are consistent with Grade I diastolic  dysfunction (impaired relaxation).   2. Right ventricular systolic function is normal. The right ventricular  size is normal. There is moderately  elevated pulmonary artery systolic  pressure. The estimated right ventricular systolic pressure is 86.7 mmHg.   3. Left atrial size was moderately dilated.   4. The mitral valve is normal in structure. Trivial mitral valve  regurgitation. No evidence of mitral stenosis.   5. The aortic valve is normal in structure. Aortic valve regurgitation is  mild. No aortic stenosis is present.   6. Aortic dilatation noted.  There is mild dilatation of the aortic root,  measuring 44 mm. There is moderate dilatation of the ascending aorta,  measuring 47 mm.   7. The inferior vena cava is normal in size with greater than 50%  respiratory variability, suggesting right atrial pressure of 3 mmHg.   Assessment and Plan:  1.  Essential hypertension, blood pressure has trended up since our last visit.  Medication adjustments made through [facility].  We went over his medications in detail.  We will have a BMET on Thursday of this week pending follow-up with oncology.  Given significant sinus bradycardia I have asked him to stop Lopressor completely.  Depending on renal function and potassium, may need to adjust doses of Aldactone and losartan.  2. Multivessel CAD status post CABG in 2013 with concurrent repair of ascending aortic aneurysm at [facility]. He underwent redo operation in May 2019 in the setting of graft failure with placement of radial to the diagonal, SVG to OM, and SVG to PDA and PL 2.  Since that time he has had further graft failure and revascularization of the OM and RCA distributions.  Status post DES x2 to the RCA in September 2021, and angioplasty of same region due to in-stent restenosis in January of this year.  Most recently underwent placement of DES to the distal RCA in March.  No progressive angina at this time.  Discontinuing Lopressor in light of significant sinus bradycardia.  Otherwise continues on Imdur, Plavix, and Lipitor.  LDL 75 in March.  3.  Paroxysmal atrial  fibrillation/flutter.  Continue Eliquis for stroke prophylaxis.  Plan to continue Cardizem CD.  She was taken off amiodarone during hospital stay prior to our last visit, but I see that he still has the bottle and has been taking it.  We have asked him to stop it for now.  4.  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  He continues to follow with oncology through [facility].  Medication Adjustments/Labs and Tests Ordered: Current medicines are reviewed at length with the patient today.  Concerns regarding medicines are outlined above.   Tests Ordered: No orders of the defined types were placed in this encounter.   Medication Changes: No orders of the defined types were placed in this encounter.   Disposition:  Follow up  6 weeks.  Signed, Satira Sark, MD, [facility] 04/09/2022 4:55 PM    [facility] [facility] [address], [place], [place] [address] Phone: [phone]; Fax: [phone]

## 2022-04-09 NOTE — Telephone Encounter (Signed)
Per [place]-Please check with Eric Beltran about amiodarone. My understanding is this was stopped during hospital stay prior to my last visit with him. Somehow now back on his list and he did have the bottle with him today. He needs to stop this.

## 2022-04-09 NOTE — Telephone Encounter (Signed)
Confirmed that he has been taking amiodarone 100 mg daily. Advised to discontinue amiodarone

## 2022-04-10 MED ORDER — PAZOPANIB 200 MG TABLET
ORAL_TABLET | Freq: Every day | ORAL | 11 refills | 30.00000 days | Status: CP
Start: 2022-04-10 — End: 2022-04-10

## 2022-04-10 NOTE — Addendum Note (Signed)
Addended by: Laurine Blazer on: 04/10/2022 08:54 AM   Modules accepted: Orders

## 2022-04-12 ENCOUNTER — Encounter: Admit: 2022-04-12 | Discharge: 2022-04-12 | Payer: MEDICARE

## 2022-04-12 ENCOUNTER — Ambulatory Visit: Admit: 2022-04-12 | Discharge: 2022-04-13 | Payer: MEDICARE

## 2022-04-12 DIAGNOSIS — C499 Malignant neoplasm of connective and soft tissue, unspecified: Secondary | ICD-10-CM | POA: Diagnosis not present

## 2022-04-12 DIAGNOSIS — Z79899 Other long term (current) drug therapy: Secondary | ICD-10-CM | POA: Diagnosis not present

## 2022-04-13 ENCOUNTER — Ambulatory Visit: Admit: 2022-04-13 | Discharge: 2022-04-14 | Payer: MEDICARE

## 2022-04-13 ENCOUNTER — Telehealth: Admit: 2022-04-13 | Discharge: 2022-04-14 | Payer: MEDICARE

## 2022-04-13 ENCOUNTER — Ambulatory Visit
Admit: 2022-04-13 | Discharge: 2022-04-14 | Payer: MEDICARE | Attending: Student in an Organized Health Care Education/Training Program | Primary: Student in an Organized Health Care Education/Training Program

## 2022-04-13 DIAGNOSIS — C7801 Secondary malignant neoplasm of right lung: Secondary | ICD-10-CM | POA: Diagnosis not present

## 2022-04-13 DIAGNOSIS — I480 Paroxysmal atrial fibrillation: Secondary | ICD-10-CM | POA: Diagnosis not present

## 2022-04-13 DIAGNOSIS — I209 Angina pectoris, unspecified: Secondary | ICD-10-CM | POA: Diagnosis not present

## 2022-04-13 DIAGNOSIS — Z951 Presence of aortocoronary bypass graft: Principal | ICD-10-CM

## 2022-04-13 DIAGNOSIS — G893 Neoplasm related pain (acute) (chronic): Principal | ICD-10-CM

## 2022-04-13 DIAGNOSIS — C7802 Secondary malignant neoplasm of left lung: Principal | ICD-10-CM

## 2022-04-13 DIAGNOSIS — I712 Thoracic aortic aneurysm without rupture, unspecified part (CMS-HCC): Principal | ICD-10-CM

## 2022-04-13 DIAGNOSIS — Z89619 Acquired absence of unspecified leg above knee: Principal | ICD-10-CM

## 2022-04-13 DIAGNOSIS — C499 Malignant neoplasm of connective and soft tissue, unspecified: Principal | ICD-10-CM

## 2022-04-16 ENCOUNTER — Encounter: Payer: Self-pay | Admitting: *Deleted

## 2022-04-16 NOTE — Progress Notes (Signed)
Satira Sark, MD  Merlene Laughter, RN Thank you.  Looks like renal function and potassium are still normal on current treatment.  No changes.       Previous Messages  Lab work you requested to see is available in [place]

## 2022-04-19 ENCOUNTER — Ambulatory Visit: Admit: 2022-04-19 | Discharge: 2022-04-20 | Payer: MEDICARE

## 2022-04-24 ENCOUNTER — Ambulatory Visit: Admit: 2022-04-24 | Discharge: 2022-04-25 | Payer: MEDICARE

## 2022-04-24 DIAGNOSIS — M25512 Pain in left shoulder: Secondary | ICD-10-CM | POA: Diagnosis not present

## 2022-04-24 DIAGNOSIS — R2232 Localized swelling, mass and lump, left upper limb: Secondary | ICD-10-CM | POA: Diagnosis not present

## 2022-05-08 ENCOUNTER — Ambulatory Visit: Admit: 2022-05-08 | Discharge: 2022-05-09 | Payer: MEDICARE

## 2022-05-08 DIAGNOSIS — Z89611 Acquired absence of right leg above knee: Secondary | ICD-10-CM | POA: Diagnosis not present

## 2022-05-21 ENCOUNTER — Ambulatory Visit: Payer: Medicare Other | Attending: Nurse Practitioner | Admitting: Nurse Practitioner

## 2022-05-21 ENCOUNTER — Encounter: Payer: Self-pay | Admitting: Nurse Practitioner

## 2022-05-21 VITALS — BP 130/74 | HR 51 | Ht 67.0 in | Wt 152.8 lb

## 2022-05-21 DIAGNOSIS — I480 Paroxysmal atrial fibrillation: Secondary | ICD-10-CM | POA: Diagnosis not present

## 2022-05-21 DIAGNOSIS — I2510 Atherosclerotic heart disease of native coronary artery without angina pectoris: Secondary | ICD-10-CM | POA: Diagnosis not present

## 2022-05-21 DIAGNOSIS — I272 Pulmonary hypertension, unspecified: Secondary | ICD-10-CM

## 2022-05-21 DIAGNOSIS — Z859 Personal history of malignant neoplasm, unspecified: Secondary | ICD-10-CM

## 2022-05-21 DIAGNOSIS — Z7901 Long term (current) use of anticoagulants: Secondary | ICD-10-CM | POA: Diagnosis not present

## 2022-05-21 DIAGNOSIS — R001 Bradycardia, unspecified: Secondary | ICD-10-CM

## 2022-05-21 DIAGNOSIS — I10 Essential (primary) hypertension: Secondary | ICD-10-CM

## 2022-05-21 DIAGNOSIS — I7121 Aneurysm of the ascending aorta, without rupture: Secondary | ICD-10-CM

## 2022-05-21 MED ORDER — DILTIAZEM HCL ER 120 MG PO CP12: 120.0000 mg | ORAL_CAPSULE | Freq: Every day | ORAL | 3 refills | Status: DC

## 2022-05-21 NOTE — Progress Notes (Signed)
Cardiology Office Note:    Date:  05/21/2022   ID:  Eric Borer., DOB 1940-11-26, MRN [id number]  PCP:  Celene Squibb, MD   [place] Prov[facility] Cardiologist:  Rozann Lesches, MD     Referring MD: Celene Squibb, MD   CC: Here for follow-up  History of Present Illness:    Eric Franchini. is a 82 y.o. 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   Patient is a delightful 82 year old male with past medical history as mentioned above.  Follows oncology.  Underwent CABG x 4 in 2013 with concurrent repair of ascending aortic aneurysm at [facility].  Had to undergo redo operation in May 2019 in setting of graft failure, placement of radial to diagonal, SVG to PDA and PL 2, SVG to OM.  Has had further graft failure and revascularization of RCA and OM distributions.  Underwent DES x 2 to RCA in September 2021 and angioplasty of same region with due to in-stent restenosis in January 2023.  In March 2023 he underwent placement of DES to distal RCA.  Last seen by Dr. Domenic Polite on April 09, 2022.  Patient noted difficulty controlling blood pressure.  Medications were adjusted at [facility].  EKG showed sinus bradycardia, 38 bpm, incomplete RBBB, low voltage, nonspecific ST segment changes.  Denied any syncope.  No worsening chest pain.  Was asked to stop amiodarone. Was told to follow-up in 6 weeks.  Today he presents for 6-week follow-up.  He states he is doing well. HR is low with highest HR at 65. Does admit to unintentional weight loss recently. Denies any CP, SHOB, palpitations, syncope, presyncope, dizziness, orthopnea, PND, swelling, or claudication.  Compliant with his  medications and tolerating well.  Denies any bleeding issues while on Eliquis.  Denies any other questions or concerns today.   Past Medical History:  Diagnosis Date   Arthritis    Ascending aortic aneurysm ([place])    Status post repair 12/2011 at [facility]   BPH (benign prostatic hyperplasia)    Chronic diastolic CHF (congestive heart failure) (HCC)    Coronary atherosclerosis of native coronary artery    a. Multivessel status post prior stenting and ultimately CABG 12/2011 at [facility] with with LIMA to LAD, SVG to PLB, SVG to ramus, SVG to OM . b. Last cath 06/2014 (following ischemic nuc) -> medical therapy, no feasible way to revascularize LCx territory.   Essential hypertension    Mixed hyperlipidemia    Myocardial infarction [facility])    PAD (peripheral artery disease) (HCC)    PAF (paroxysmal atrial fibrillation) ([place])    Supraventricular tachycardia     Past Surgical History:  Procedure Laterality Date   ASCENDING AORTIC ANEURYSM REPAIR  12/2011 [facility]   26 mm.Dacron graft   CORONARY ARTERY BYPASS GRAFT  12/2011 [facility]   LIMA to LAD, SVG to PLB, SVG to ramus, SVG to OM   CORONARY ARTERY BYPASS GRAFT N/A 08/06/2017   Procedure: REDO CORONARY ARTERY BYPASS GRAFTING (CABG) TIMES FOUR UTILIZING LEFT GREATER SAPHENOUS VEIN HAVESTED ENDOVASCULARLY, LEFT RADIAL ARTERY.  RIGHT AXILLARY CANNULATION;  Surgeon: Gaye Pollack, MD;  Location: [place];  Service: Open Heart Surgery;  Laterality: N/A;   CORONARY ATHERECTOMY N/A 02/10/2020   Procedure: CORONARY ATHERECTOMY;  Surgeon: [name], MD;  Location: [place] CV LAB;  Service: Cardiovascular;  Laterality: N/A;   CORONARY BALLOON ANGIOPLASTY N/A 06/10/2020   Procedure: CORONARY BALLOON ANGIOPLASTY;  Surgeon: Wellington Hampshire, MD;  Location: [place] CV LAB;  Service: Cardiovascular;  Laterality: N/A;   CORONARY STENT INTERVENTION N/A 02/10/2020   Procedure: CORONARY STENT INTERVENTION;  Surgeon: [name], MD;  Location: [place] CV  LAB;  Service: Cardiovascular;  Laterality: N/A;   CORONARY STENT INTERVENTION N/A 07/26/2021   Procedure: CORONARY STENT INTERVENTION;  Surgeon: Wellington Hampshire, MD;  Location: [place] CV LAB;  Service: Cardiovascular;  Laterality: N/A;  RCA   HERNIA REPAIR     INTRAVASCULAR ULTRASOUND/IVUS N/A 07/26/2021   Procedure: Intravascular Ultrasound/IVUS;  Surgeon: Wellington Hampshire, MD;  Location: [place] CV LAB;  Service: Cardiovascular;  Laterality: N/A;  OCT   IR REMOVAL TUN CV CATH W/O FL  09/19/2021   LEFT HEART CATH AND CORS/GRAFTS ANGIOGRAPHY N/A 07/11/2017   Procedure: LEFT HEART CATH AND CORS/GRAFTS ANGIOGRAPHY;  Surgeon: Troy Sine, MD;  Location: [place] CV LAB;  Service: Cardiovascular;  Laterality: N/A;   LEFT HEART CATH AND CORS/GRAFTS ANGIOGRAPHY N/A 09/12/2018   Procedure: LEFT HEART CATH AND CORS/GRAFTS ANGIOGRAPHY;  Surgeon: Troy Sine, MD;  Location: [place] CV LAB;  Service: Cardiovascular;  Laterality: N/A;   LEFT HEART CATH AND CORS/GRAFTS ANGIOGRAPHY N/A 02/10/2020   Procedure: LEFT HEART CATH AND CORS/GRAFTS ANGIOGRAPHY;  Surgeon: [name], MD;  Location: [place] CV LAB;  Service: Cardiovascular;  Laterality: N/A;   LEFT HEART CATH AND CORS/GRAFTS ANGIOGRAPHY N/A 06/10/2020   Procedure: LEFT HEART CATH AND CORS/GRAFTS ANGIOGRAPHY;  Surgeon: Wellington Hampshire, MD;  Location: [place] CV LAB;  Service: Cardiovascular;  Laterality: N/A;   LEFT HEART CATH AND CORS/GRAFTS ANGIOGRAPHY N/A 07/26/2021   Procedure: LEFT HEART CATH AND CORS/GRAFTS ANGIOGRAPHY;  Surgeon: Wellington Hampshire, MD;  Location: [place] CV LAB;  Service: Cardiovascular;  Laterality: N/A;   LEFT HEART CATHETERIZATION WITH CORONARY ANGIOGRAM N/A 06/18/2014   Procedure: LEFT HEART CATHETERIZATION WITH CORONARY ANGIOGRAM;  Surgeon: Blane Ohara, MD;  Location: [facility] CATH LAB;  Service: Cardiovascular;  Laterality: N/A;   RADIAL ARTERY HARVEST Left 08/06/2017   Procedure: RADIAL  ARTERY HARVEST;  Surgeon: Gaye Pollack, MD;  Location: [place];  Service: Open Heart Surgery;  Laterality: Left;   TEE WITHOUT CARDIOVERSION N/A 08/06/2017   Procedure: TRANSESOPHAGEAL ECHOCARDIOGRAM (TEE);  Surgeon: Gaye Pollack, MD;  Location: [place];  Service: Open Heart Surgery;  Laterality: N/A;   TOTAL HIP ARTHROPLASTY Right 02/07/2016   Procedure: RIGHT TOTAL HIP ARTHROPLASTY ANTERIOR APPROACH;  Surgeon: Mcarthur Rossetti, MD;  Location: [place];  Service: Orthopedics;  Laterality: Right;   TRANSURETHRAL RESECTION OF PROSTATE      Current Medications: Current Meds  Medication Sig   apixaban (ELIQUIS) 5 MG TABS tablet Take 5 mg by mouth 2 (two) times daily.   Ascorbic Acid (VITAMIN C) 500 MG CAPS Take 1 tablet by mouth daily.   atorvastatin (LIPITOR) 80 MG tablet TAKE 1 TABLET BY MOUTH AT BEDTIME   clopidogrel (PLAVIX) 75 MG tablet TAKE 1 TABLET BY MOUTH DAILY WITH BREAKFAST   furosemide (LASIX) 20 MG tablet Take 20 mg by mouth daily.   glipiZIDE (GLUCOTROL) 5 MG tablet Take 1 tablet (5 mg total) by mouth daily before breakfast.  isosorbide mononitrate (IMDUR) 60 MG 24 hr tablet TAKE 1 TABLET BY MOUTH TWICE DAILY every morning AND in THE evening   losartan (COZAAR) 50 MG tablet TAKE 2 TABLETS BY MOUTH EVERY DAY (Patient taking differently: Take 50 mg by mouth daily.)   metFORMIN (GLUCOPHAGE) 500 MG tablet Take 1 tablet (500 mg total) by mouth 2 (two) times daily with a meal.   Multiple Vitamin (MULTIVITAMIN) tablet Take 1 tablet by mouth at bedtime.   nitroGLYCERIN (NITROSTAT) 0.4 MG SL tablet DISSOLVE 1 TABLET UNDER THE TONGUE EVERY 5 MINUTES AS NEEDED FOR CHEST PAIN. DO NOT EXCEED A TOTAL OF 3 DOSES IN 15 MINUTES. (Patient taking differently: Place 0.4 mg under the tongue every 5 (five) minutes as needed for chest pain.)   Omega-3 Fatty Acids (FISH OIL) 1000 MG CAPS Take 2,000 mg by mouth daily.   pazopanib (VOTRIENT) 200 MG tablet Take 400 mg by mouth daily.   spironolactone  (ALDACTONE) 50 MG tablet Take 50 mg by mouth daily.   tamsulosin (FLOMAX) 0.4 MG CAPS capsule Take 0.4 mg by mouth at bedtime.   valACYclovir (VALTREX) 500 MG tablet Take 500 mg by mouth daily as needed (fever blisters).    zinc gluconate 50 MG tablet Take 50 mg by mouth at bedtime.   diltiazem (CARDIZEM CD) 240 MG 24 hr capsule TAKE ONE CAPSULE BY MOUTH DAILY     Allergies:   Patient has no known allergies.   Social History   Socioeconomic History   Marital status: Widowed    Spouse name: Not on file   Number of children: Not on file   Years of education: Not on file   Highest education level: Not on file  Occupational History   Not on file  Tobacco Use   Smoking status: Never    Passive exposure: Never   Smokeless tobacco: Never   Tobacco comments:    tobacco use - no  Vaping Use   Vaping Use: Never used  Substance and Sexual Activity   Alcohol use: Yes    Alcohol/week: 0.0 standard drinks of alcohol    Comment: Occasional   Drug use: No   Sexual activity: Not on file  Other Topics Concern   Not on file  Social History Narrative   Retired, widower, does not get regular exercise.          Social Determinants of Health   Financial Resource Strain: Not on file  Food Insecurity: Not on file  Transportation Needs: Not on file  Physical Activity: Not on file  Stress: Not on file  Social Connections: Not on file     Family History: The patient's family history includes Hypertension in his father and mother; Lung cancer in his sister; Parkinson's disease in his father.  ROS:   Review of Systems  Constitutional: Negative.   HENT: Negative.    Eyes: Negative.   Respiratory: Negative.    Cardiovascular: Negative.   Gastrointestinal: Negative.   Genitourinary: Negative.   Musculoskeletal: Negative.   Skin: Negative.   Neurological:  Positive for weakness. Negative for dizziness, tingling, tremors, sensory change, speech change, focal weakness, seizures, loss of  consciousness and headaches.  Endo/Heme/Allergies: Negative.   Psychiatric/Behavioral: Negative.      Please see the history of present illness.    All other systems reviewed and are negative.  EKGs/Labs/Other Studies Reviewed:    The following studies were reviewed today:   EKG:  EKG is ordered today.  The ekg ordered today demonstrates  sinus bradycardia, 51 bpm, first-degree AV block, incomplete RBBB, artifact noted in several leads, otherwise no acute ischemic changes.   Echocardiogram on Sep 18, 2021:  1. Left ventricular ejection fraction, by estimation, is 60 to 65%. The  left ventricle has normal function. The left ventricle has no regional  wall motion abnormalities. There is severe left ventricular hypertrophy of  the septal segment. Left  ventricular diastolic parameters are consistent with Grade I diastolic  dysfunction (impaired relaxation).   2. Right ventricular systolic function is normal. The right ventricular  size is normal. There is moderately elevated pulmonary artery systolic  pressure. The estimated right ventricular systolic pressure is 26.3 mmHg.   3. Left atrial size was moderately dilated.   4. The mitral valve is normal in structure. Trivial mitral valve  regurgitation. No evidence of mitral stenosis.   5. The aortic valve is normal in structure. Aortic valve regurgitation is  mild. No aortic stenosis is present.   6. Aortic dilatation noted. There is mild dilatation of the aortic root,  measuring 44 mm. There is moderate dilatation of the ascending aorta,  measuring 47 mm.   7. The inferior vena cava is normal in size with greater than 50%  respiratory variability, suggesting right atrial pressure of 3 mmHg.   Comparison(s): No significant change from prior study. Prior images  reviewed side by side.   Conclusion(s)/Recommendation(s): No evidence of valvular vegetations on  this transthoracic echocardiogram. Consider a transesophageal   echocardiogram to exclude infective endocarditis if clinically indicated.  Left heart cath on July 26, 2021:   Prox LAD-1 lesion is 80% stenosed.   Prox LAD-2 lesion is 70% stenosed.   Ost LAD lesion is 30% stenosed.   Ost Cx to Prox Cx lesion is 100% stenosed.   Prox RCA to Mid RCA lesion is 30% stenosed.   Ost RPDA lesion is 65% stenosed.   1st RPL lesion is 50% stenosed.   Dist RCA-2 lesion is 30% stenosed.   Dist RCA-1 lesion is 90% stenosed.   Non-stenotic Mid RCA lesion was previously treated.   A drug-eluting stent was successfully placed using a STENT ONYX FRONTIER 3.0X15.   Post intervention, there is a 0% residual stenosis.   The left ventricular systolic function is normal.   LV end diastolic pressure is mildly elevated.   The left ventricular ejection fraction is 55-65% by visual estimate.   1.  Severe underlying three-vessel coronary artery disease with known occluded SVG grafts.  These were not engaged today.  Patent LIMA to LAD.  Chronically occluded proximal left circumflex stent with well-developed right to left collaterals.  Patent RCA stents with focal in-stent restenosis distally in the same area that was treated before with balloon angioplasty. 2.  Normal LV systolic function mildly elevated left ventricular end-diastolic pressure. 3.  Successful OCT guided PCI and drug-eluting stent placement to the distal right coronary artery.  OCT showed well-expanded previous stent with no stent fractures and focal neointimal hyperplasia.   Recommendations: Eliquis can be resumed tomorrow as long as no bleeding complications. Once Eliquis is resumed, aspirin can be discontinued.  Continue clopidogrel indefinitely as tolerated. Aggressive treatment of risk factors.   Vascular ultrasound of lower right extremity venous on May 02, 2020: Summary:  RIGHT:  - Findings consistent with continued deep vein thrombosis involving the  right popliteal vein.  - The vein appears  occlusive with no compression or color flow.  - There is a large, mixed echo mass observed in  the popliteal fossa which  may be causing extrinsic compression on the popliteal vein.  - Of note, the femoral-below knee popliteal bypass is patent.  Vascular ultrasound lower right extremity on April 18, 2020: Persistent findings of occlusive deep venous thrombosis within the right popliteal vein, similar to those reported on outside study approximately 3 weeks ago.  The femoral veins remain patent.  Recent Labs: 09/16/2021: B Natriuretic Peptide 280.3 09/21/2021: ALT 59 09/25/2021: BUN 20; Creatinine, Ser 0.99; Hemoglobin 8.8; Magnesium 2.2; Platelets 461; Potassium 4.1; Sodium 137  Recent Lipid Panel    Component Value Date/Time   CHOL 128 07/26/2021 1115   TRIG 78 07/26/2021 1115   HDL 37 (L) 07/26/2021 1115   CHOLHDL 3.5 07/26/2021 1115   VLDL 16 07/26/2021 1115   LDLCALC 75 07/26/2021 1115     Risk Assessment/Calculations:    CHA2DS2-VASc Score = 4  This indicates a 4.8% annual risk of stroke. The patient's score is based upon: CHF History: 0 HTN History: 1 Diabetes History: 0 Stroke History: 0 Vascular Disease History: 1 Age Score: 2 Gender Score: 0   Physical Exam:    VS:  BP 130/74   Pulse (!) 51   Ht '5\' 7"'$  (1.702 m)   Wt 152 lb 12.8 oz (69.3 kg)   SpO2 96%   BMI 23.93 kg/m     Wt Readings from Last 3 Encounters:  05/21/22 152 lb 12.8 oz (69.3 kg)  04/09/22 160 lb (72.6 kg)  01/03/22 160 lb 9.6 oz (72.8 kg)     GEN: Thin 82 y.o. male in no acute distress HEENT: Normal NECK: No JVD; No carotid bruits CARDIAC: S1/S2, slow rate and regular rhythm, no murmurs, rubs, gallops; 2+ pulses throughout, UTA RLE pulse RESPIRATORY:  Clear to auscultation without rales, wheezing or rhonchi  MUSCULOSKELETAL:  No edema; R AKA SKIN: Warm and dry NEUROLOGIC:  Alert and oriented x 3 PSYCHIATRIC:  Normal affect   ASSESSMENT:    1. PAF (paroxysmal atrial fibrillation)  ([place])   2. Bradycardia   3. Chronic anticoagulation   4. Coronary artery disease involving native coronary artery of native heart without angina pectoris   5. Hypertension, unspecified type   6. Aneurysm of ascending aorta without rupture (HCC)   7. Pulmonary HTN ([place])   8. History of cancer    PLAN:    In order of problems listed above:  PAF, bradycardia, long term anticoagulation Denies any tachycardia or palpitations.  Heart rate today is 51.  Asymptomatic with this.  Will reduce diltiazem to 120 mg daily.  EKG revealed sinus bradycardia.  History of being unable to tolerate amiodarone due to toxicity.  Not on any other rate lowering medications.  At next follow-up if heart rate does not improve, plan to do discontinue diltiazem and only take Cardizem short acting tablets as needed for palpitations. Heart healthy diet and regular cardiovascular exercise as tolerated encouraged. Recommended he have Magnesium level obtained with upcoming lab work and to fax our office results.  Continue Eliquis 5 mg twice daily as he denies any issues of bleeding, he is currently on appropriate dosage. I stated if his weight continues to decline we will need to reduce Eliquis to 2.5 mg twice daily.  He verbalized understanding.  CAD, s/p CABG x 4 in 2013 Hx of CABG x 4 in 2013 with concurrent repair of ascending aortic aneurysm at [facility].  Had to undergo redo operation in May 2019 in setting of graft failure, placement of  radial to diagonal, SVG to PDA and PL 2, SVG to OM.  Has had further graft failure and revascularization of RCA and OM distributions.  Underwent DES x 2 to RCA in September 2021 and angioplasty of same region with due to in-stent restenosis in January 2023.  In March 2023 he underwent placement of DES to distal RCA. Stable with no anginal symptoms. No indication for ischemic evaluation.  Continue Plavix, Imdur, losartan, atorvastatin, and nitroglycerin as needed. Heart healthy diet and  regular cardiovascular exercise as tolerated encouraged.   HTN Blood pressure today 130/74. BP well controlled at home. Discussed to monitor BP at home at least 2 hours after medications and sitting for 5-10 minutes.  Continue current medication regimen. Heart healthy diet and regular cardiovascular exercise encouraged.   Ascending aortic aneurysm Moderate dilatation of ascending aorta measuring 47 mm noted on echocardiogram in May 2023.  There is mild dilatation of aortic root, measuring 44 mm.  Plan to discuss updating 2D echocardiogram at next office visit.  Heart healthy diet and regular cardiovascular exercise as tolerated encouraged.    5. Pulmonary HTN, cancer Denies any shortness of breath.  Echocardiogram in May 2023 revealed moderately elevated pulmonary artery systolic pressure.  Estimated RVSP was 45.9 mmHg.  Most likely secondary to history of sarcoma with pulmonary metastasis.  Currently taking Pazopanib.  Continue to follow-up with PCP and oncology. Heart healthy diet and regular cardiovascular exercise as tolerated encouraged.   6. Disposition: Follow-up with me in 1 month or sooner if anything changes.  Medication Adjustments/Labs and Tests Ordered: Current medicines are reviewed at length with the patient today.  Concerns regarding medicines are outlined above.  Orders Placed This Encounter  Procedures   EKG 12-Lead   Meds ordered this encounter  Medications   diltiazem (CARDIZEM SR) 120 MG 12 hr capsule    Sig: Take 1 capsule (120 mg total) by mouth daily.    Dispense:  90 capsule    Refill:  3    Patient Instructions  Medication Instructions:  Your physician has recommended you make the following change in your medication:  - Stop Diltiazem 240 mg tablets  - Start Diltiazem 120 mg tablets.    Labwork: None  Testing/Procedures: None  Follow-Up: Follow up with Eric Bud, NP in 1 month.   Any Other Special Instructions Will Be Listed Below (If  Applicable).     If you need a refill on your cardiac medications before your next appointment, please call your pharmacy.    Signed, Eric Bud, NP  05/21/2022 8:59 PM    [place]

## 2022-05-21 NOTE — Patient Instructions (Signed)
Medication Instructions:  Your physician has recommended you make the following change in your medication:  - Stop Diltiazem 240 mg tablets  - Start Diltiazem 120 mg tablets.    Labwork: None  Testing/Procedures: None  Follow-Up: Follow up with Finis Bud, NP in 1 month.   Any Other Special Instructions Will Be Listed Below (If Applicable).     If you need a refill on your cardiac medications before your next appointment, please call your pharmacy.

## 2022-05-22 DIAGNOSIS — J019 Acute sinusitis, unspecified: Secondary | ICD-10-CM | POA: Diagnosis not present

## 2022-05-23 ENCOUNTER — Ambulatory Visit: Admit: 2022-05-23 | Discharge: 2022-05-23

## 2022-05-23 DIAGNOSIS — Z89611 Acquired absence of right leg above knee: Secondary | ICD-10-CM | POA: Diagnosis not present

## 2022-05-25 ENCOUNTER — Ambulatory Visit: Admit: 2022-05-25 | Discharge: 2022-05-26 | Payer: MEDICARE

## 2022-05-25 DIAGNOSIS — K5730 Diverticulosis of large intestine without perforation or abscess without bleeding: Secondary | ICD-10-CM | POA: Diagnosis not present

## 2022-05-25 DIAGNOSIS — K869 Disease of pancreas, unspecified: Secondary | ICD-10-CM | POA: Diagnosis not present

## 2022-05-25 DIAGNOSIS — K3189 Other diseases of stomach and duodenum: Secondary | ICD-10-CM | POA: Diagnosis not present

## 2022-05-25 DIAGNOSIS — C419 Malignant neoplasm of bone and articular cartilage, unspecified: Secondary | ICD-10-CM | POA: Diagnosis not present

## 2022-05-25 DIAGNOSIS — Z951 Presence of aortocoronary bypass graft: Secondary | ICD-10-CM | POA: Diagnosis not present

## 2022-05-25 DIAGNOSIS — I700 Atherosclerosis of aorta: Secondary | ICD-10-CM | POA: Diagnosis not present

## 2022-05-25 DIAGNOSIS — M899 Disorder of bone, unspecified: Secondary | ICD-10-CM | POA: Diagnosis not present

## 2022-05-25 DIAGNOSIS — I2510 Atherosclerotic heart disease of native coronary artery without angina pectoris: Secondary | ICD-10-CM | POA: Diagnosis not present

## 2022-05-25 DIAGNOSIS — C499 Malignant neoplasm of connective and soft tissue, unspecified: Secondary | ICD-10-CM | POA: Diagnosis not present

## 2022-05-25 DIAGNOSIS — N400 Enlarged prostate without lower urinary tract symptoms: Secondary | ICD-10-CM | POA: Diagnosis not present

## 2022-05-25 DIAGNOSIS — K5790 Diverticulosis of intestine, part unspecified, without perforation or abscess without bleeding: Secondary | ICD-10-CM | POA: Diagnosis not present

## 2022-05-25 DIAGNOSIS — C7802 Secondary malignant neoplasm of left lung: Secondary | ICD-10-CM | POA: Diagnosis not present

## 2022-05-25 DIAGNOSIS — M8580 Other specified disorders of bone density and structure, unspecified site: Secondary | ICD-10-CM | POA: Diagnosis not present

## 2022-05-25 DIAGNOSIS — R918 Other nonspecific abnormal finding of lung field: Secondary | ICD-10-CM | POA: Diagnosis not present

## 2022-05-26 ENCOUNTER — Other Ambulatory Visit: Payer: Self-pay | Admitting: Cardiology

## 2022-05-30 ENCOUNTER — Ambulatory Visit: Admit: 2022-05-30 | Payer: MEDICARE

## 2022-05-30 ENCOUNTER — Ambulatory Visit: Admit: 2022-05-30 | Discharge: 2022-06-28 | Payer: MEDICARE

## 2022-05-30 DIAGNOSIS — R2689 Other abnormalities of gait and mobility: Secondary | ICD-10-CM | POA: Diagnosis not present

## 2022-05-30 DIAGNOSIS — R2681 Unsteadiness on feet: Secondary | ICD-10-CM | POA: Diagnosis not present

## 2022-05-30 DIAGNOSIS — Z89611 Acquired absence of right leg above knee: Secondary | ICD-10-CM | POA: Diagnosis not present

## 2022-05-30 DIAGNOSIS — Z85831 Personal history of malignant neoplasm of soft tissue: Secondary | ICD-10-CM | POA: Diagnosis not present

## 2022-06-04 DIAGNOSIS — Z85831 Personal history of malignant neoplasm of soft tissue: Secondary | ICD-10-CM | POA: Diagnosis not present

## 2022-06-04 DIAGNOSIS — Z89611 Acquired absence of right leg above knee: Secondary | ICD-10-CM | POA: Diagnosis not present

## 2022-06-04 DIAGNOSIS — R2681 Unsteadiness on feet: Principal | ICD-10-CM

## 2022-06-04 DIAGNOSIS — R262 Difficulty in walking, not elsewhere classified: Principal | ICD-10-CM

## 2022-06-04 DIAGNOSIS — R2689 Other abnormalities of gait and mobility: Secondary | ICD-10-CM | POA: Diagnosis not present

## 2022-06-05 ENCOUNTER — Telehealth: Admit: 2022-06-05 | Discharge: 2022-06-06 | Payer: MEDICARE

## 2022-06-05 DIAGNOSIS — I480 Paroxysmal atrial fibrillation: Secondary | ICD-10-CM | POA: Diagnosis not present

## 2022-06-05 DIAGNOSIS — C499 Malignant neoplasm of connective and soft tissue, unspecified: Secondary | ICD-10-CM | POA: Diagnosis not present

## 2022-06-05 DIAGNOSIS — I712 Thoracic aortic aneurysm, without rupture, unspecified: Secondary | ICD-10-CM | POA: Diagnosis not present

## 2022-06-05 DIAGNOSIS — I209 Angina pectoris, unspecified: Secondary | ICD-10-CM | POA: Diagnosis not present

## 2022-06-06 DIAGNOSIS — R2681 Unsteadiness on feet: Principal | ICD-10-CM

## 2022-06-06 DIAGNOSIS — Z89611 Acquired absence of right leg above knee: Principal | ICD-10-CM

## 2022-06-06 DIAGNOSIS — R262 Difficulty in walking, not elsewhere classified: Principal | ICD-10-CM

## 2022-06-06 DIAGNOSIS — C499 Malignant neoplasm of connective and soft tissue, unspecified: Principal | ICD-10-CM

## 2022-06-06 DIAGNOSIS — Z85831 Personal history of malignant neoplasm of soft tissue: Secondary | ICD-10-CM | POA: Diagnosis not present

## 2022-06-06 DIAGNOSIS — R2689 Other abnormalities of gait and mobility: Secondary | ICD-10-CM | POA: Diagnosis not present

## 2022-06-08 DIAGNOSIS — C7802 Secondary malignant neoplasm of left lung: Principal | ICD-10-CM

## 2022-06-08 DIAGNOSIS — C7801 Secondary malignant neoplasm of right lung: Principal | ICD-10-CM

## 2022-06-08 DIAGNOSIS — C499 Malignant neoplasm of connective and soft tissue, unspecified: Principal | ICD-10-CM

## 2022-06-08 DIAGNOSIS — K869 Disease of pancreas, unspecified: Principal | ICD-10-CM

## 2022-06-12 DIAGNOSIS — R2689 Other abnormalities of gait and mobility: Secondary | ICD-10-CM | POA: Diagnosis not present

## 2022-06-12 DIAGNOSIS — Z89611 Acquired absence of right leg above knee: Secondary | ICD-10-CM | POA: Diagnosis not present

## 2022-06-12 DIAGNOSIS — Z85831 Personal history of malignant neoplasm of soft tissue: Secondary | ICD-10-CM | POA: Diagnosis not present

## 2022-06-12 DIAGNOSIS — R2681 Unsteadiness on feet: Principal | ICD-10-CM

## 2022-06-12 DIAGNOSIS — R262 Difficulty in walking, not elsewhere classified: Principal | ICD-10-CM

## 2022-06-12 DIAGNOSIS — C7801 Secondary malignant neoplasm of right lung: Principal | ICD-10-CM

## 2022-06-12 DIAGNOSIS — C7802 Secondary malignant neoplasm of left lung: Principal | ICD-10-CM

## 2022-06-13 DIAGNOSIS — Z89611 Acquired absence of right leg above knee: Secondary | ICD-10-CM | POA: Diagnosis not present

## 2022-06-13 DIAGNOSIS — Z85831 Personal history of malignant neoplasm of soft tissue: Secondary | ICD-10-CM | POA: Diagnosis not present

## 2022-06-13 DIAGNOSIS — R2689 Other abnormalities of gait and mobility: Secondary | ICD-10-CM | POA: Diagnosis not present

## 2022-06-13 DIAGNOSIS — R2681 Unsteadiness on feet: Secondary | ICD-10-CM | POA: Diagnosis not present

## 2022-06-13 DIAGNOSIS — C499 Malignant neoplasm of connective and soft tissue, unspecified: Principal | ICD-10-CM

## 2022-06-13 DIAGNOSIS — R262 Difficulty in walking, not elsewhere classified: Principal | ICD-10-CM

## 2022-06-15 ENCOUNTER — Ambulatory Visit: Admit: 2022-06-15 | Discharge: 2022-06-16 | Payer: MEDICARE

## 2022-06-15 DIAGNOSIS — C419 Malignant neoplasm of bone and articular cartilage, unspecified: Secondary | ICD-10-CM | POA: Diagnosis not present

## 2022-06-15 DIAGNOSIS — Z8719 Personal history of other diseases of the digestive system: Secondary | ICD-10-CM | POA: Diagnosis not present

## 2022-06-15 DIAGNOSIS — C3490 Malignant neoplasm of unspecified part of unspecified bronchus or lung: Secondary | ICD-10-CM | POA: Diagnosis not present

## 2022-06-15 DIAGNOSIS — C7951 Secondary malignant neoplasm of bone: Secondary | ICD-10-CM | POA: Diagnosis not present

## 2022-06-15 DIAGNOSIS — C259 Malignant neoplasm of pancreas, unspecified: Secondary | ICD-10-CM | POA: Diagnosis not present

## 2022-06-15 DIAGNOSIS — K8689 Other specified diseases of pancreas: Secondary | ICD-10-CM | POA: Diagnosis not present

## 2022-06-15 DIAGNOSIS — C7800 Secondary malignant neoplasm of unspecified lung: Secondary | ICD-10-CM | POA: Diagnosis not present

## 2022-06-15 DIAGNOSIS — C499 Malignant neoplasm of connective and soft tissue, unspecified: Secondary | ICD-10-CM | POA: Diagnosis not present

## 2022-06-18 ENCOUNTER — Encounter: Payer: Self-pay | Admitting: Cardiology

## 2022-06-18 DIAGNOSIS — Z85831 Personal history of malignant neoplasm of soft tissue: Secondary | ICD-10-CM | POA: Diagnosis not present

## 2022-06-18 DIAGNOSIS — Z89611 Acquired absence of right leg above knee: Secondary | ICD-10-CM | POA: Diagnosis not present

## 2022-06-18 DIAGNOSIS — R2689 Other abnormalities of gait and mobility: Secondary | ICD-10-CM | POA: Diagnosis not present

## 2022-06-18 DIAGNOSIS — C499 Malignant neoplasm of connective and soft tissue, unspecified: Principal | ICD-10-CM

## 2022-06-18 DIAGNOSIS — R2681 Unsteadiness on feet: Principal | ICD-10-CM

## 2022-06-18 DIAGNOSIS — R262 Difficulty in walking, not elsewhere classified: Principal | ICD-10-CM

## 2022-06-19 DIAGNOSIS — Z85831 Personal history of malignant neoplasm of soft tissue: Secondary | ICD-10-CM | POA: Diagnosis not present

## 2022-06-19 DIAGNOSIS — Z89611 Acquired absence of right leg above knee: Secondary | ICD-10-CM | POA: Diagnosis not present

## 2022-06-19 DIAGNOSIS — R2681 Unsteadiness on feet: Secondary | ICD-10-CM | POA: Diagnosis not present

## 2022-06-19 DIAGNOSIS — R2689 Other abnormalities of gait and mobility: Secondary | ICD-10-CM | POA: Diagnosis not present

## 2022-06-19 DIAGNOSIS — C499 Malignant neoplasm of connective and soft tissue, unspecified: Principal | ICD-10-CM

## 2022-06-19 DIAGNOSIS — R262 Difficulty in walking, not elsewhere classified: Principal | ICD-10-CM

## 2022-06-20 ENCOUNTER — Ambulatory Visit
Admit: 2022-06-20 | Discharge: 2022-06-21 | Payer: MEDICARE | Attending: Radiation Oncology | Primary: Radiation Oncology

## 2022-06-20 ENCOUNTER — Ambulatory Visit: Admit: 2022-06-14 | Payer: MEDICARE

## 2022-06-20 DIAGNOSIS — C499 Malignant neoplasm of connective and soft tissue, unspecified: Secondary | ICD-10-CM | POA: Diagnosis not present

## 2022-06-21 ENCOUNTER — Ambulatory Visit: Payer: Self-pay | Admitting: Nurse Practitioner

## 2022-06-21 ENCOUNTER — Ambulatory Visit: Admit: 2022-06-21 | Discharge: 2022-06-22 | Payer: MEDICARE

## 2022-06-25 ENCOUNTER — Other Ambulatory Visit: Payer: Self-pay | Admitting: Cardiology

## 2022-06-25 DIAGNOSIS — Z85831 Personal history of malignant neoplasm of soft tissue: Secondary | ICD-10-CM | POA: Diagnosis not present

## 2022-06-25 DIAGNOSIS — Z89611 Acquired absence of right leg above knee: Secondary | ICD-10-CM | POA: Diagnosis not present

## 2022-06-25 DIAGNOSIS — R2689 Other abnormalities of gait and mobility: Secondary | ICD-10-CM | POA: Diagnosis not present

## 2022-06-25 DIAGNOSIS — R2681 Unsteadiness on feet: Secondary | ICD-10-CM | POA: Diagnosis not present

## 2022-06-27 DIAGNOSIS — Z85831 Personal history of malignant neoplasm of soft tissue: Secondary | ICD-10-CM | POA: Diagnosis not present

## 2022-06-27 DIAGNOSIS — Z89611 Acquired absence of right leg above knee: Secondary | ICD-10-CM | POA: Diagnosis not present

## 2022-06-27 DIAGNOSIS — R2689 Other abnormalities of gait and mobility: Secondary | ICD-10-CM | POA: Diagnosis not present

## 2022-06-27 DIAGNOSIS — R2681 Unsteadiness on feet: Secondary | ICD-10-CM | POA: Diagnosis not present

## 2022-06-28 ENCOUNTER — Encounter: Admit: 2022-06-28 | Discharge: 2022-06-28 | Payer: MEDICARE

## 2022-06-28 ENCOUNTER — Ambulatory Visit: Admit: 2022-06-28 | Discharge: 2022-06-29 | Payer: MEDICARE

## 2022-06-28 DIAGNOSIS — C7801 Secondary malignant neoplasm of right lung: Secondary | ICD-10-CM | POA: Diagnosis not present

## 2022-06-28 DIAGNOSIS — C7802 Secondary malignant neoplasm of left lung: Secondary | ICD-10-CM | POA: Diagnosis not present

## 2022-06-29 ENCOUNTER — Telehealth: Admit: 2022-06-29 | Discharge: 2022-06-30 | Payer: MEDICARE

## 2022-06-29 DIAGNOSIS — R918 Other nonspecific abnormal finding of lung field: Secondary | ICD-10-CM | POA: Diagnosis not present

## 2022-06-29 DIAGNOSIS — Z79899 Other long term (current) drug therapy: Secondary | ICD-10-CM | POA: Diagnosis not present

## 2022-06-29 DIAGNOSIS — Z1159 Encounter for screening for other viral diseases: Principal | ICD-10-CM

## 2022-06-29 DIAGNOSIS — C499 Malignant neoplasm of connective and soft tissue, unspecified: Principal | ICD-10-CM

## 2022-07-02 ENCOUNTER — Ambulatory Visit: Admit: 2022-07-02 | Payer: MEDICARE

## 2022-07-02 ENCOUNTER — Ambulatory Visit: Admit: 2022-07-02 | Discharge: 2022-07-28 | Payer: MEDICARE

## 2022-07-02 DIAGNOSIS — Z85831 Personal history of malignant neoplasm of soft tissue: Secondary | ICD-10-CM | POA: Diagnosis not present

## 2022-07-02 DIAGNOSIS — Z89611 Acquired absence of right leg above knee: Secondary | ICD-10-CM | POA: Diagnosis not present

## 2022-07-02 DIAGNOSIS — C499 Malignant neoplasm of connective and soft tissue, unspecified: Principal | ICD-10-CM

## 2022-07-02 DIAGNOSIS — R262 Difficulty in walking, not elsewhere classified: Principal | ICD-10-CM

## 2022-07-02 DIAGNOSIS — R2681 Unsteadiness on feet: Principal | ICD-10-CM

## 2022-07-02 DIAGNOSIS — R2689 Other abnormalities of gait and mobility: Secondary | ICD-10-CM | POA: Diagnosis not present

## 2022-07-04 DIAGNOSIS — R2681 Unsteadiness on feet: Secondary | ICD-10-CM | POA: Diagnosis not present

## 2022-07-04 DIAGNOSIS — Z85831 Personal history of malignant neoplasm of soft tissue: Secondary | ICD-10-CM | POA: Diagnosis not present

## 2022-07-04 DIAGNOSIS — R262 Difficulty in walking, not elsewhere classified: Principal | ICD-10-CM

## 2022-07-04 DIAGNOSIS — Z89611 Acquired absence of right leg above knee: Principal | ICD-10-CM

## 2022-07-04 DIAGNOSIS — C499 Malignant neoplasm of connective and soft tissue, unspecified: Principal | ICD-10-CM

## 2022-07-04 DIAGNOSIS — R2689 Other abnormalities of gait and mobility: Secondary | ICD-10-CM | POA: Diagnosis not present

## 2022-07-09 DIAGNOSIS — R2689 Other abnormalities of gait and mobility: Secondary | ICD-10-CM | POA: Diagnosis not present

## 2022-07-09 DIAGNOSIS — Z85831 Personal history of malignant neoplasm of soft tissue: Secondary | ICD-10-CM | POA: Diagnosis not present

## 2022-07-09 DIAGNOSIS — Z89611 Acquired absence of right leg above knee: Secondary | ICD-10-CM | POA: Diagnosis not present

## 2022-07-09 DIAGNOSIS — R2681 Unsteadiness on feet: Secondary | ICD-10-CM | POA: Diagnosis not present

## 2022-07-09 DIAGNOSIS — C499 Malignant neoplasm of connective and soft tissue, unspecified: Principal | ICD-10-CM

## 2022-07-09 DIAGNOSIS — R262 Difficulty in walking, not elsewhere classified: Principal | ICD-10-CM

## 2022-07-12 DIAGNOSIS — Z85831 Personal history of malignant neoplasm of soft tissue: Secondary | ICD-10-CM | POA: Diagnosis not present

## 2022-07-12 DIAGNOSIS — R2681 Unsteadiness on feet: Secondary | ICD-10-CM | POA: Diagnosis not present

## 2022-07-12 DIAGNOSIS — Z89611 Acquired absence of right leg above knee: Secondary | ICD-10-CM | POA: Diagnosis not present

## 2022-07-12 DIAGNOSIS — R2689 Other abnormalities of gait and mobility: Secondary | ICD-10-CM | POA: Diagnosis not present

## 2022-07-13 ENCOUNTER — Ambulatory Visit: Admit: 2022-07-13 | Payer: MEDICARE

## 2022-07-18 ENCOUNTER — Ambulatory Visit: Admit: 2022-07-18 | Discharge: 2022-07-18 | Payer: MEDICARE

## 2022-07-18 ENCOUNTER — Encounter: Admit: 2022-07-18 | Discharge: 2022-07-18 | Payer: MEDICARE

## 2022-07-18 DIAGNOSIS — C499 Malignant neoplasm of connective and soft tissue, unspecified: Secondary | ICD-10-CM | POA: Diagnosis not present

## 2022-07-18 DIAGNOSIS — R2681 Unsteadiness on feet: Secondary | ICD-10-CM | POA: Diagnosis not present

## 2022-07-18 DIAGNOSIS — Z1159 Encounter for screening for other viral diseases: Secondary | ICD-10-CM | POA: Diagnosis not present

## 2022-07-18 DIAGNOSIS — Z79899 Other long term (current) drug therapy: Secondary | ICD-10-CM | POA: Diagnosis not present

## 2022-07-18 DIAGNOSIS — Z89611 Acquired absence of right leg above knee: Secondary | ICD-10-CM | POA: Diagnosis not present

## 2022-07-18 DIAGNOSIS — R262 Difficulty in walking, not elsewhere classified: Principal | ICD-10-CM

## 2022-07-18 DIAGNOSIS — R918 Other nonspecific abnormal finding of lung field: Principal | ICD-10-CM

## 2022-07-18 DIAGNOSIS — Z85831 Personal history of malignant neoplasm of soft tissue: Secondary | ICD-10-CM | POA: Diagnosis not present

## 2022-07-18 DIAGNOSIS — R2689 Other abnormalities of gait and mobility: Secondary | ICD-10-CM | POA: Diagnosis not present

## 2022-07-19 ENCOUNTER — Ambulatory Visit: Admit: 2022-07-19 | Discharge: 2022-07-19 | Payer: MEDICARE

## 2022-07-19 DIAGNOSIS — Z79899 Other long term (current) drug therapy: Secondary | ICD-10-CM | POA: Diagnosis not present

## 2022-07-19 DIAGNOSIS — C499 Malignant neoplasm of connective and soft tissue, unspecified: Secondary | ICD-10-CM | POA: Diagnosis not present

## 2022-07-19 DIAGNOSIS — R918 Other nonspecific abnormal finding of lung field: Principal | ICD-10-CM

## 2022-07-19 DIAGNOSIS — Z1159 Encounter for screening for other viral diseases: Principal | ICD-10-CM

## 2022-07-19 DIAGNOSIS — Z5112 Encounter for antineoplastic immunotherapy: Secondary | ICD-10-CM | POA: Diagnosis not present

## 2022-07-23 DIAGNOSIS — R2681 Unsteadiness on feet: Secondary | ICD-10-CM | POA: Diagnosis not present

## 2022-07-23 DIAGNOSIS — Z85831 Personal history of malignant neoplasm of soft tissue: Secondary | ICD-10-CM | POA: Diagnosis not present

## 2022-07-23 DIAGNOSIS — Z89611 Acquired absence of right leg above knee: Secondary | ICD-10-CM | POA: Diagnosis not present

## 2022-07-23 DIAGNOSIS — R262 Difficulty in walking, not elsewhere classified: Principal | ICD-10-CM

## 2022-07-23 DIAGNOSIS — R2689 Other abnormalities of gait and mobility: Secondary | ICD-10-CM | POA: Diagnosis not present

## 2022-07-25 DIAGNOSIS — R2689 Other abnormalities of gait and mobility: Secondary | ICD-10-CM | POA: Diagnosis not present

## 2022-07-25 DIAGNOSIS — Z89611 Acquired absence of right leg above knee: Secondary | ICD-10-CM | POA: Diagnosis not present

## 2022-07-25 DIAGNOSIS — Z85831 Personal history of malignant neoplasm of soft tissue: Secondary | ICD-10-CM | POA: Diagnosis not present

## 2022-07-25 DIAGNOSIS — R2681 Unsteadiness on feet: Secondary | ICD-10-CM | POA: Diagnosis not present

## 2022-07-30 ENCOUNTER — Ambulatory Visit: Admit: 2022-07-30 | Payer: MEDICARE

## 2022-07-30 ENCOUNTER — Ambulatory Visit: Admit: 2022-07-30 | Discharge: 2022-08-27 | Payer: MEDICARE

## 2022-07-30 DIAGNOSIS — Z961 Presence of intraocular lens: Secondary | ICD-10-CM | POA: Diagnosis not present

## 2022-07-30 DIAGNOSIS — Z89611 Acquired absence of right leg above knee: Principal | ICD-10-CM

## 2022-07-30 DIAGNOSIS — R262 Difficulty in walking, not elsewhere classified: Principal | ICD-10-CM

## 2022-07-30 DIAGNOSIS — R2681 Unsteadiness on feet: Principal | ICD-10-CM

## 2022-08-06 DIAGNOSIS — Z89611 Acquired absence of right leg above knee: Principal | ICD-10-CM

## 2022-08-06 DIAGNOSIS — R262 Difficulty in walking, not elsewhere classified: Principal | ICD-10-CM

## 2022-08-06 DIAGNOSIS — C499 Malignant neoplasm of connective and soft tissue, unspecified: Principal | ICD-10-CM

## 2022-08-06 DIAGNOSIS — R2681 Unsteadiness on feet: Principal | ICD-10-CM

## 2022-08-08 ENCOUNTER — Encounter: Admit: 2022-08-08 | Discharge: 2022-08-08 | Payer: MEDICARE

## 2022-08-08 ENCOUNTER — Ambulatory Visit: Admit: 2022-08-08 | Discharge: 2022-08-09 | Payer: MEDICARE

## 2022-08-08 DIAGNOSIS — C499 Malignant neoplasm of connective and soft tissue, unspecified: Secondary | ICD-10-CM | POA: Diagnosis not present

## 2022-08-08 DIAGNOSIS — Z1159 Encounter for screening for other viral diseases: Secondary | ICD-10-CM | POA: Diagnosis not present

## 2022-08-08 DIAGNOSIS — Z79899 Other long term (current) drug therapy: Secondary | ICD-10-CM | POA: Diagnosis not present

## 2022-08-08 DIAGNOSIS — R918 Other nonspecific abnormal finding of lung field: Principal | ICD-10-CM

## 2022-08-08 DIAGNOSIS — R262 Difficulty in walking, not elsewhere classified: Principal | ICD-10-CM

## 2022-08-08 DIAGNOSIS — Z89611 Acquired absence of right leg above knee: Principal | ICD-10-CM

## 2022-08-08 DIAGNOSIS — R2681 Unsteadiness on feet: Principal | ICD-10-CM

## 2022-08-09 ENCOUNTER — Ambulatory Visit: Admit: 2022-08-09 | Discharge: 2022-08-10 | Payer: MEDICARE

## 2022-08-09 DIAGNOSIS — Z79899 Other long term (current) drug therapy: Secondary | ICD-10-CM | POA: Diagnosis not present

## 2022-08-09 DIAGNOSIS — R918 Other nonspecific abnormal finding of lung field: Secondary | ICD-10-CM | POA: Diagnosis not present

## 2022-08-09 DIAGNOSIS — Z1159 Encounter for screening for other viral diseases: Secondary | ICD-10-CM | POA: Diagnosis not present

## 2022-08-09 DIAGNOSIS — E871 Hypo-osmolality and hyponatremia: Secondary | ICD-10-CM | POA: Diagnosis not present

## 2022-08-09 DIAGNOSIS — E861 Hypovolemia: Secondary | ICD-10-CM | POA: Diagnosis not present

## 2022-08-09 DIAGNOSIS — Z5112 Encounter for antineoplastic immunotherapy: Secondary | ICD-10-CM | POA: Diagnosis not present

## 2022-08-09 DIAGNOSIS — C499 Malignant neoplasm of connective and soft tissue, unspecified: Principal | ICD-10-CM

## 2022-08-09 DIAGNOSIS — I959 Hypotension, unspecified: Principal | ICD-10-CM

## 2022-08-09 DIAGNOSIS — R000 Tachycardia, unspecified: Principal | ICD-10-CM

## 2022-08-15 DIAGNOSIS — Z89611 Acquired absence of right leg above knee: Principal | ICD-10-CM

## 2022-08-15 DIAGNOSIS — R262 Difficulty in walking, not elsewhere classified: Principal | ICD-10-CM

## 2022-08-15 DIAGNOSIS — C499 Malignant neoplasm of connective and soft tissue, unspecified: Principal | ICD-10-CM

## 2022-08-15 DIAGNOSIS — R2681 Unsteadiness on feet: Principal | ICD-10-CM

## 2022-08-22 ENCOUNTER — Other Ambulatory Visit: Payer: Self-pay | Admitting: Cardiology

## 2022-08-22 DIAGNOSIS — Z89611 Acquired absence of right leg above knee: Principal | ICD-10-CM

## 2022-08-22 DIAGNOSIS — R262 Difficulty in walking, not elsewhere classified: Principal | ICD-10-CM

## 2022-08-22 DIAGNOSIS — R2681 Unsteadiness on feet: Principal | ICD-10-CM

## 2022-08-22 DIAGNOSIS — C499 Malignant neoplasm of connective and soft tissue, unspecified: Principal | ICD-10-CM

## 2022-08-30 ENCOUNTER — Other Ambulatory Visit: Payer: Self-pay | Admitting: Cardiology

## 2022-08-30 ENCOUNTER — Encounter: Admit: 2022-08-30 | Discharge: 2022-08-31 | Payer: MEDICARE

## 2022-08-30 ENCOUNTER — Ambulatory Visit: Admit: 2022-08-30 | Discharge: 2022-08-31 | Payer: MEDICARE

## 2022-08-30 DIAGNOSIS — R918 Other nonspecific abnormal finding of lung field: Secondary | ICD-10-CM | POA: Diagnosis not present

## 2022-08-30 DIAGNOSIS — C499 Malignant neoplasm of connective and soft tissue, unspecified: Secondary | ICD-10-CM | POA: Diagnosis not present

## 2022-08-30 DIAGNOSIS — M79605 Pain in left leg: Secondary | ICD-10-CM | POA: Diagnosis not present

## 2022-08-30 DIAGNOSIS — Z79899 Other long term (current) drug therapy: Secondary | ICD-10-CM | POA: Diagnosis not present

## 2022-08-30 DIAGNOSIS — Z1159 Encounter for screening for other viral diseases: Secondary | ICD-10-CM | POA: Diagnosis not present

## 2022-08-30 DIAGNOSIS — Z5112 Encounter for antineoplastic immunotherapy: Secondary | ICD-10-CM | POA: Diagnosis not present

## 2022-08-30 DIAGNOSIS — M79662 Pain in left lower leg: Principal | ICD-10-CM

## 2022-08-30 DIAGNOSIS — M7989 Other specified soft tissue disorders: Principal | ICD-10-CM

## 2022-08-30 NOTE — Telephone Encounter (Signed)
Prescription refill request for Eliquis received. Indication: PAF Last office visit: 05/21/22  Shawnie Dapper NP Scr: 0.77 on 08/08/22  Epic Age: 82 Weight: 69.3kg  Based on above findings Eliquis  twice daily is the appropriate dose.  Refill approved.

## 2022-08-31 ENCOUNTER — Ambulatory Visit: Admit: 2022-08-31 | Discharge: 2022-09-01 | Payer: MEDICARE

## 2022-08-31 ENCOUNTER — Telehealth: Admit: 2022-08-31 | Discharge: 2022-09-01 | Payer: MEDICARE

## 2022-08-31 DIAGNOSIS — I712 Thoracic aortic aneurysm without rupture, unspecified part (CMS-HCC): Principal | ICD-10-CM

## 2022-08-31 DIAGNOSIS — I480 Paroxysmal atrial fibrillation: Secondary | ICD-10-CM | POA: Diagnosis not present

## 2022-08-31 DIAGNOSIS — C7802 Secondary malignant neoplasm of left lung: Secondary | ICD-10-CM | POA: Diagnosis not present

## 2022-08-31 DIAGNOSIS — C499 Malignant neoplasm of connective and soft tissue, unspecified: Secondary | ICD-10-CM | POA: Diagnosis not present

## 2022-08-31 DIAGNOSIS — C7801 Secondary malignant neoplasm of right lung: Principal | ICD-10-CM

## 2022-08-31 DIAGNOSIS — G893 Neoplasm related pain (acute) (chronic): Principal | ICD-10-CM

## 2022-08-31 DIAGNOSIS — Z79899 Other long term (current) drug therapy: Principal | ICD-10-CM

## 2022-08-31 DIAGNOSIS — I209 Angina pectoris, unspecified: Principal | ICD-10-CM

## 2022-08-31 DIAGNOSIS — I25118 Atherosclerotic heart disease of native coronary artery with other forms of angina pectoris: Principal | ICD-10-CM

## 2022-08-31 DIAGNOSIS — Z89619 Acquired absence of unspecified leg above knee: Principal | ICD-10-CM

## 2022-08-31 DIAGNOSIS — M79605 Pain in left leg: Secondary | ICD-10-CM | POA: Diagnosis not present

## 2022-08-31 MED ORDER — OXYCODONE 5 MG TABLET
ORAL_TABLET | ORAL | 0 refills | 5 days | Status: CP | PRN
Start: 2022-08-31 — End: ?

## 2022-08-31 MED ORDER — POLYETHYLENE GLYCOL 3350 17 GRAM ORAL POWDER PACKET
PACK | Freq: Every day | ORAL | 1 refills | 30 days | Status: CP
Start: 2022-08-31 — End: 2022-10-30

## 2022-08-31 MED ORDER — SENNOSIDES 8.6 MG TABLET
ORAL_TABLET | Freq: Two times a day (BID) | ORAL | 2 refills | 30 days | Status: CP
Start: 2022-08-31 — End: 2023-08-31

## 2022-09-03 ENCOUNTER — Emergency Department: Admit: 2022-09-03 | Discharge: 2022-09-03 | Discharge status: Against Medical Advice | Payer: MEDICARE

## 2022-09-03 ENCOUNTER — Ambulatory Visit: Admit: 2022-09-03 | Discharge: 2022-09-03 | Discharge status: Against Medical Advice | Payer: MEDICARE

## 2022-09-03 DIAGNOSIS — I10 Essential (primary) hypertension: Secondary | ICD-10-CM | POA: Diagnosis not present

## 2022-09-03 DIAGNOSIS — E7800 Pure hypercholesterolemia, unspecified: Secondary | ICD-10-CM | POA: Diagnosis not present

## 2022-09-03 DIAGNOSIS — Z7984 Long term (current) use of oral hypoglycemic drugs: Secondary | ICD-10-CM | POA: Diagnosis not present

## 2022-09-03 DIAGNOSIS — Z9689 Presence of other specified functional implants: Secondary | ICD-10-CM | POA: Diagnosis not present

## 2022-09-03 DIAGNOSIS — K5900 Constipation, unspecified: Secondary | ICD-10-CM | POA: Diagnosis not present

## 2022-09-03 DIAGNOSIS — M1990 Unspecified osteoarthritis, unspecified site: Secondary | ICD-10-CM | POA: Diagnosis not present

## 2022-09-03 DIAGNOSIS — Z7189 Other specified counseling: Secondary | ICD-10-CM | POA: Diagnosis not present

## 2022-09-03 DIAGNOSIS — K5641 Fecal impaction: Secondary | ICD-10-CM | POA: Diagnosis not present

## 2022-09-03 DIAGNOSIS — Z7901 Long term (current) use of anticoagulants: Secondary | ICD-10-CM | POA: Diagnosis not present

## 2022-09-03 DIAGNOSIS — Z9079 Acquired absence of other genital organ(s): Secondary | ICD-10-CM | POA: Diagnosis not present

## 2022-09-03 DIAGNOSIS — Z79899 Other long term (current) drug therapy: Secondary | ICD-10-CM | POA: Diagnosis not present

## 2022-09-03 DIAGNOSIS — N400 Enlarged prostate without lower urinary tract symptoms: Secondary | ICD-10-CM | POA: Diagnosis not present

## 2022-09-03 DIAGNOSIS — R112 Nausea with vomiting, unspecified: Secondary | ICD-10-CM | POA: Diagnosis not present

## 2022-09-03 DIAGNOSIS — K5909 Other constipation: Secondary | ICD-10-CM | POA: Diagnosis not present

## 2022-09-03 DIAGNOSIS — Z1152 Encounter for screening for COVID-19: Secondary | ICD-10-CM | POA: Diagnosis not present

## 2022-09-03 DIAGNOSIS — Z951 Presence of aortocoronary bypass graft: Secondary | ICD-10-CM | POA: Diagnosis not present

## 2022-09-03 DIAGNOSIS — Z9102 Food additives allergy status: Secondary | ICD-10-CM | POA: Diagnosis not present

## 2022-09-03 DIAGNOSIS — R918 Other nonspecific abnormal finding of lung field: Secondary | ICD-10-CM | POA: Diagnosis not present

## 2022-09-05 ENCOUNTER — Ambulatory Visit: Admit: 2022-09-05 | Discharge: 2022-09-06 | Payer: MEDICARE

## 2022-09-05 DIAGNOSIS — R918 Other nonspecific abnormal finding of lung field: Secondary | ICD-10-CM | POA: Diagnosis not present

## 2022-09-05 DIAGNOSIS — K869 Disease of pancreas, unspecified: Secondary | ICD-10-CM | POA: Diagnosis not present

## 2022-09-05 DIAGNOSIS — C7802 Secondary malignant neoplasm of left lung: Secondary | ICD-10-CM | POA: Diagnosis not present

## 2022-09-05 DIAGNOSIS — C499 Malignant neoplasm of connective and soft tissue, unspecified: Secondary | ICD-10-CM | POA: Diagnosis not present

## 2022-09-05 DIAGNOSIS — C7801 Secondary malignant neoplasm of right lung: Secondary | ICD-10-CM | POA: Diagnosis not present

## 2022-09-05 DIAGNOSIS — N2889 Other specified disorders of kidney and ureter: Secondary | ICD-10-CM | POA: Diagnosis not present

## 2022-09-07 ENCOUNTER — Telehealth: Admit: 2022-09-07 | Discharge: 2022-09-07 | Payer: MEDICARE

## 2022-09-07 ENCOUNTER — Ambulatory Visit: Admit: 2022-09-07 | Discharge: 2022-09-07 | Payer: MEDICARE

## 2022-09-07 DIAGNOSIS — C499 Malignant neoplasm of connective and soft tissue, unspecified: Secondary | ICD-10-CM | POA: Diagnosis not present

## 2022-09-20 ENCOUNTER — Other Ambulatory Visit: Payer: Self-pay | Admitting: Cardiology

## 2022-09-26 ENCOUNTER — Other Ambulatory Visit: Payer: Self-pay | Admitting: Cardiology

## 2022-09-28 DIAGNOSIS — G4700 Insomnia, unspecified: Secondary | ICD-10-CM | POA: Diagnosis not present

## 2022-09-28 DIAGNOSIS — I482 Chronic atrial fibrillation, unspecified: Secondary | ICD-10-CM | POA: Diagnosis not present

## 2022-09-28 DIAGNOSIS — C499 Malignant neoplasm of connective and soft tissue, unspecified: Secondary | ICD-10-CM | POA: Diagnosis not present

## 2022-09-28 DIAGNOSIS — R251 Tremor, unspecified: Secondary | ICD-10-CM | POA: Diagnosis not present

## 2022-09-28 DIAGNOSIS — I10 Essential (primary) hypertension: Secondary | ICD-10-CM | POA: Diagnosis not present

## 2022-10-01 ENCOUNTER — Telehealth: Payer: Self-pay | Admitting: Cardiology

## 2022-10-01 ENCOUNTER — Encounter: Payer: Self-pay | Admitting: Emergency Medicine

## 2022-10-01 ENCOUNTER — Emergency Department

## 2022-10-01 DIAGNOSIS — Z7984 Long term (current) use of oral hypoglycemic drugs: Secondary | ICD-10-CM

## 2022-10-01 DIAGNOSIS — E782 Mixed hyperlipidemia: Secondary | ICD-10-CM | POA: Diagnosis present

## 2022-10-01 DIAGNOSIS — Z7901 Long term (current) use of anticoagulants: Secondary | ICD-10-CM

## 2022-10-01 DIAGNOSIS — B001 Herpesviral vesicular dermatitis: Secondary | ICD-10-CM | POA: Diagnosis not present

## 2022-10-01 DIAGNOSIS — C4921 Malignant neoplasm of connective and soft tissue of right lower limb, including hip: Secondary | ICD-10-CM | POA: Diagnosis not present

## 2022-10-01 DIAGNOSIS — Z801 Family history of malignant neoplasm of trachea, bronchus and lung: Secondary | ICD-10-CM

## 2022-10-01 DIAGNOSIS — Z79899 Other long term (current) drug therapy: Secondary | ICD-10-CM

## 2022-10-01 DIAGNOSIS — I252 Old myocardial infarction: Secondary | ICD-10-CM | POA: Diagnosis not present

## 2022-10-01 DIAGNOSIS — I10 Essential (primary) hypertension: Secondary | ICD-10-CM | POA: Diagnosis not present

## 2022-10-01 DIAGNOSIS — I480 Paroxysmal atrial fibrillation: Secondary | ICD-10-CM | POA: Diagnosis present

## 2022-10-01 DIAGNOSIS — I2510 Atherosclerotic heart disease of native coronary artery without angina pectoris: Secondary | ICD-10-CM | POA: Diagnosis not present

## 2022-10-01 DIAGNOSIS — I5032 Chronic diastolic (congestive) heart failure: Secondary | ICD-10-CM | POA: Diagnosis present

## 2022-10-01 DIAGNOSIS — I739 Peripheral vascular disease, unspecified: Secondary | ICD-10-CM | POA: Diagnosis present

## 2022-10-01 DIAGNOSIS — Z9181 History of falling: Secondary | ICD-10-CM | POA: Diagnosis not present

## 2022-10-01 DIAGNOSIS — C7800 Secondary malignant neoplasm of unspecified lung: Secondary | ICD-10-CM | POA: Diagnosis not present

## 2022-10-01 DIAGNOSIS — M6281 Muscle weakness (generalized): Secondary | ICD-10-CM | POA: Diagnosis not present

## 2022-10-01 DIAGNOSIS — Z8249 Family history of ischemic heart disease and other diseases of the circulatory system: Secondary | ICD-10-CM

## 2022-10-01 DIAGNOSIS — I25709 Atherosclerosis of coronary artery bypass graft(s), unspecified, with unspecified angina pectoris: Secondary | ICD-10-CM | POA: Diagnosis not present

## 2022-10-01 DIAGNOSIS — S72302A Unspecified fracture of shaft of left femur, initial encounter for closed fracture: Secondary | ICD-10-CM | POA: Diagnosis not present

## 2022-10-01 DIAGNOSIS — S7292XA Unspecified fracture of left femur, initial encounter for closed fracture: Secondary | ICD-10-CM | POA: Diagnosis not present

## 2022-10-01 DIAGNOSIS — Z820 Family history of epilepsy and other diseases of the nervous system: Secondary | ICD-10-CM

## 2022-10-01 DIAGNOSIS — Z955 Presence of coronary angioplasty implant and graft: Secondary | ICD-10-CM | POA: Diagnosis not present

## 2022-10-01 DIAGNOSIS — Z96641 Presence of right artificial hip joint: Secondary | ICD-10-CM | POA: Diagnosis present

## 2022-10-01 DIAGNOSIS — Z89611 Acquired absence of right leg above knee: Secondary | ICD-10-CM

## 2022-10-01 DIAGNOSIS — S72002A Fracture of unspecified part of neck of left femur, initial encounter for closed fracture: Secondary | ICD-10-CM | POA: Diagnosis not present

## 2022-10-01 DIAGNOSIS — M84552A Pathological fracture in neoplastic disease, left femur, initial encounter for fracture: Principal | ICD-10-CM | POA: Diagnosis present

## 2022-10-01 DIAGNOSIS — R1311 Dysphagia, oral phase: Secondary | ICD-10-CM | POA: Diagnosis not present

## 2022-10-01 DIAGNOSIS — C499 Malignant neoplasm of connective and soft tissue, unspecified: Secondary | ICD-10-CM | POA: Diagnosis not present

## 2022-10-01 DIAGNOSIS — Z741 Need for assistance with personal care: Secondary | ICD-10-CM | POA: Diagnosis not present

## 2022-10-01 DIAGNOSIS — C7951 Secondary malignant neoplasm of bone: Secondary | ICD-10-CM | POA: Diagnosis present

## 2022-10-01 DIAGNOSIS — I7121 Aneurysm of the ascending aorta, without rupture: Secondary | ICD-10-CM | POA: Diagnosis not present

## 2022-10-01 DIAGNOSIS — I200 Unstable angina: Secondary | ICD-10-CM | POA: Diagnosis not present

## 2022-10-01 DIAGNOSIS — M1990 Unspecified osteoarthritis, unspecified site: Secondary | ICD-10-CM | POA: Diagnosis not present

## 2022-10-01 DIAGNOSIS — N400 Enlarged prostate without lower urinary tract symptoms: Secondary | ICD-10-CM | POA: Diagnosis present

## 2022-10-01 DIAGNOSIS — Z7902 Long term (current) use of antithrombotics/antiplatelets: Secondary | ICD-10-CM

## 2022-10-01 DIAGNOSIS — R269 Unspecified abnormalities of gait and mobility: Secondary | ICD-10-CM | POA: Diagnosis not present

## 2022-10-01 DIAGNOSIS — R2681 Unsteadiness on feet: Secondary | ICD-10-CM | POA: Diagnosis not present

## 2022-10-01 DIAGNOSIS — R262 Difficulty in walking, not elsewhere classified: Secondary | ICD-10-CM | POA: Diagnosis not present

## 2022-10-01 DIAGNOSIS — M84452A Pathological fracture, left femur, initial encounter for fracture: Secondary | ICD-10-CM | POA: Diagnosis not present

## 2022-10-01 DIAGNOSIS — M84452D Pathological fracture, left femur, subsequent encounter for fracture with routine healing: Secondary | ICD-10-CM | POA: Diagnosis not present

## 2022-10-01 DIAGNOSIS — R410 Disorientation, unspecified: Secondary | ICD-10-CM | POA: Diagnosis not present

## 2022-10-01 DIAGNOSIS — I110 Hypertensive heart disease with heart failure: Secondary | ICD-10-CM | POA: Diagnosis not present

## 2022-10-01 DIAGNOSIS — Z9221 Personal history of antineoplastic chemotherapy: Secondary | ICD-10-CM

## 2022-10-01 DIAGNOSIS — R609 Edema, unspecified: Secondary | ICD-10-CM | POA: Diagnosis not present

## 2022-10-01 NOTE — ED Triage Notes (Signed)
Pt presents ambulatory to triage via POV with complaints of L thigh pain that started tonight. Pt endorses mild swelling and tenderness with palpation to the lateral aspect of his L thigh. Pt is a R AKA. A&Ox4 at this time. Denies falls, injury, CP or SOB.

## 2022-10-01 NOTE — Telephone Encounter (Signed)
Pt c/o medication issue:  1. Name of Medication:  diltiazem (CARDIZEM SR) 120 MG 12 hr capsule  2. How are you currently taking this medication (dosage and times per day)?   3. Are you having a reaction (difficulty breathing--STAT)?   4. What is your medication issue?   Patient is requesting to have his prescription changed back to 2 capsules daily if possible.

## 2022-10-02 ENCOUNTER — Encounter
Admission: EM | Disposition: A | Discharge status: Skilled Nursing Facility | Payer: Self-pay | Source: Home / Self Care | Attending: Internal Medicine

## 2022-10-02 ENCOUNTER — Other Ambulatory Visit: Payer: Self-pay

## 2022-10-02 ENCOUNTER — Inpatient Hospital Stay
Admission: EM | Admit: 2022-10-02 | Discharge: 2022-10-05 | DRG: 481 | Disposition: A | Discharge status: Skilled Nursing Facility | Attending: Hospitalist | Admitting: Hospitalist

## 2022-10-02 ENCOUNTER — Inpatient Hospital Stay: Admitting: Anesthesiology

## 2022-10-02 ENCOUNTER — Inpatient Hospital Stay

## 2022-10-02 ENCOUNTER — Encounter: Payer: Self-pay | Admitting: Internal Medicine

## 2022-10-02 DIAGNOSIS — C7800 Secondary malignant neoplasm of unspecified lung: Secondary | ICD-10-CM | POA: Diagnosis present

## 2022-10-02 DIAGNOSIS — M84552A Pathological fracture in neoplastic disease, left femur, initial encounter for fracture: Principal | ICD-10-CM

## 2022-10-02 DIAGNOSIS — Z79899 Other long term (current) drug therapy: Secondary | ICD-10-CM | POA: Diagnosis not present

## 2022-10-02 DIAGNOSIS — I25709 Atherosclerosis of coronary artery bypass graft(s), unspecified, with unspecified angina pectoris: Secondary | ICD-10-CM | POA: Diagnosis present

## 2022-10-02 DIAGNOSIS — Z7902 Long term (current) use of antithrombotics/antiplatelets: Secondary | ICD-10-CM | POA: Diagnosis not present

## 2022-10-02 DIAGNOSIS — Z96641 Presence of right artificial hip joint: Secondary | ICD-10-CM | POA: Diagnosis present

## 2022-10-02 DIAGNOSIS — I10 Essential (primary) hypertension: Secondary | ICD-10-CM | POA: Diagnosis present

## 2022-10-02 DIAGNOSIS — C4921 Malignant neoplasm of connective and soft tissue of right lower limb, including hip: Secondary | ICD-10-CM | POA: Diagnosis present

## 2022-10-02 DIAGNOSIS — I739 Peripheral vascular disease, unspecified: Secondary | ICD-10-CM | POA: Diagnosis present

## 2022-10-02 DIAGNOSIS — Z955 Presence of coronary angioplasty implant and graft: Secondary | ICD-10-CM | POA: Diagnosis not present

## 2022-10-02 DIAGNOSIS — C7951 Secondary malignant neoplasm of bone: Secondary | ICD-10-CM | POA: Diagnosis present

## 2022-10-02 DIAGNOSIS — Z801 Family history of malignant neoplasm of trachea, bronchus and lung: Secondary | ICD-10-CM | POA: Diagnosis not present

## 2022-10-02 DIAGNOSIS — I5032 Chronic diastolic (congestive) heart failure: Secondary | ICD-10-CM | POA: Diagnosis present

## 2022-10-02 DIAGNOSIS — I110 Hypertensive heart disease with heart failure: Secondary | ICD-10-CM | POA: Diagnosis present

## 2022-10-02 DIAGNOSIS — E782 Mixed hyperlipidemia: Secondary | ICD-10-CM | POA: Diagnosis present

## 2022-10-02 DIAGNOSIS — M84452A Pathological fracture, left femur, initial encounter for fracture: Secondary | ICD-10-CM | POA: Diagnosis not present

## 2022-10-02 DIAGNOSIS — Z7984 Long term (current) use of oral hypoglycemic drugs: Secondary | ICD-10-CM | POA: Diagnosis not present

## 2022-10-02 DIAGNOSIS — Z89611 Acquired absence of right leg above knee: Secondary | ICD-10-CM | POA: Diagnosis not present

## 2022-10-02 DIAGNOSIS — I480 Paroxysmal atrial fibrillation: Secondary | ICD-10-CM | POA: Diagnosis present

## 2022-10-02 DIAGNOSIS — I2510 Atherosclerotic heart disease of native coronary artery without angina pectoris: Secondary | ICD-10-CM | POA: Diagnosis present

## 2022-10-02 DIAGNOSIS — C499 Malignant neoplasm of connective and soft tissue, unspecified: Secondary | ICD-10-CM | POA: Diagnosis present

## 2022-10-02 DIAGNOSIS — N400 Enlarged prostate without lower urinary tract symptoms: Secondary | ICD-10-CM | POA: Diagnosis present

## 2022-10-02 DIAGNOSIS — Z8249 Family history of ischemic heart disease and other diseases of the circulatory system: Secondary | ICD-10-CM | POA: Diagnosis not present

## 2022-10-02 DIAGNOSIS — M1990 Unspecified osteoarthritis, unspecified site: Secondary | ICD-10-CM | POA: Diagnosis present

## 2022-10-02 DIAGNOSIS — I252 Old myocardial infarction: Secondary | ICD-10-CM | POA: Diagnosis not present

## 2022-10-02 DIAGNOSIS — I7121 Aneurysm of the ascending aorta, without rupture: Secondary | ICD-10-CM | POA: Diagnosis present

## 2022-10-02 DIAGNOSIS — Z7901 Long term (current) use of anticoagulants: Secondary | ICD-10-CM | POA: Diagnosis not present

## 2022-10-02 HISTORY — PX: INTRAMEDULLARY (IM) NAIL INTERTROCHANTERIC: SHX5875

## 2022-10-02 LAB — CBC WITH DIFFERENTIAL/PLATELET
Abs Immature Granulocytes: 0.11 10*3/uL — ABNORMAL HIGH (ref 0.00–0.07)
Basophils Absolute: 0.1 10*3/uL (ref 0.0–0.1)
Basophils Relative: 1 %
Eosinophils Absolute: 0.3 10*3/uL (ref 0.0–0.5)
Eosinophils Relative: 2 %
HCT: 29.2 % — ABNORMAL LOW (ref 39.0–52.0)
Hemoglobin: 9 g/dL — ABNORMAL LOW (ref 13.0–17.0)
Immature Granulocytes: 1 %
Lymphocytes Relative: 4 %
Lymphs Abs: 0.7 10*3/uL (ref 0.7–4.0)
MCH: 27.7 pg (ref 26.0–34.0)
MCHC: 30.8 g/dL (ref 30.0–36.0)
MCV: 89.8 fL (ref 80.0–100.0)
Monocytes Absolute: 1.8 10*3/uL — ABNORMAL HIGH (ref 0.1–1.0)
Monocytes Relative: 12 %
Neutro Abs: 12.4 10*3/uL — ABNORMAL HIGH (ref 1.7–7.7)
Neutrophils Relative %: 80 %
Platelets: 336 10*3/uL (ref 150–400)
RBC: 3.25 MIL/uL — ABNORMAL LOW (ref 4.22–5.81)
RDW: 18 % — ABNORMAL HIGH (ref 11.5–15.5)
WBC: 15.4 10*3/uL — ABNORMAL HIGH (ref 4.0–10.5)
nRBC: 0 % (ref 0.0–0.2)

## 2022-10-02 LAB — BASIC METABOLIC PANEL
Anion gap: 9 (ref 5–15)
BUN: 15 mg/dL (ref 8–23)
CO2: 24 mmol/L (ref 22–32)
Calcium: 8.2 mg/dL — ABNORMAL LOW (ref 8.9–10.3)
Chloride: 102 mmol/L (ref 98–111)
Creatinine, Ser: 0.73 mg/dL (ref 0.61–1.24)
GFR, Estimated: >60 mL/min (ref >60)
Glucose, Bld: 144 mg/dL — ABNORMAL HIGH (ref 70–99)
Potassium: 3.9 mmol/L (ref 3.5–5.1)
Sodium: 135 mmol/L (ref 135–145)

## 2022-10-02 LAB — TYPE AND SCREEN
ABO/RH(D): A POS
Antibody Screen: NEGATIVE

## 2022-10-02 LAB — PROTIME-INR
INR: 1.4 — ABNORMAL HIGH (ref 0.8–1.2)
Prothrombin Time: 17.5 seconds — ABNORMAL HIGH (ref 11.4–15.2)

## 2022-10-02 LAB — APTT: aPTT: 46 seconds — ABNORMAL HIGH (ref 24–36)

## 2022-10-02 SURGERY — FIXATION, FRACTURE, INTERTROCHANTERIC, WITH INTRAMEDULLARY ROD
Anesthesia: General | Site: Hip | Laterality: Left

## 2022-10-02 MED ORDER — FLEET ENEMA 7-19 GM/118ML RE ENEM: 1.0000 | ENEMA | Freq: Once | RECTAL | Status: DC | PRN

## 2022-10-02 MED ORDER — DROPERIDOL 2.5 MG/ML IJ SOLN: 0.6250 mg | Freq: Once | INTRAMUSCULAR | Status: DC | PRN

## 2022-10-02 MED ORDER — ACETAMINOPHEN 325 MG PO TABS
650.0000 mg | ORAL_TABLET | Freq: Four times a day (QID) | ORAL | Status: DC | PRN
  Administered 2022-10-03: 650 mg via ORAL
  Filled 2022-10-02 (×2): qty 2

## 2022-10-02 MED ORDER — SODIUM CHLORIDE 0.9 % IV SOLN: INTRAVENOUS | Status: DC

## 2022-10-02 MED ORDER — DEXAMETHASONE SODIUM PHOSPHATE 10 MG/ML IJ SOLN
INTRAMUSCULAR | Status: DC | PRN
  Administered 2022-10-02: 8 mg via INTRAVENOUS

## 2022-10-02 MED ORDER — ONDANSETRON HCL 4 MG PO TABS: 4.0000 mg | ORAL_TABLET | Freq: Four times a day (QID) | ORAL | Status: DC | PRN

## 2022-10-02 MED ORDER — HYDROMORPHONE HCL 1 MG/ML IJ SOLN
INTRAMUSCULAR | Status: DC | PRN
  Administered 2022-10-02: .5 mg via INTRAVENOUS

## 2022-10-02 MED ORDER — LIDOCAINE HCL (PF) 2 % IJ SOLN
INTRAMUSCULAR | Status: AC
  Filled 2022-10-02: qty 5

## 2022-10-02 MED ORDER — VITAMIN C 500 MG PO TABS
500.0000 mg | ORAL_TABLET | Freq: Every day | ORAL | Status: DC
  Administered 2022-10-02 – 2022-10-05 (×4): 500 mg via ORAL
  Filled 2022-10-02 (×5): qty 1

## 2022-10-02 MED ORDER — HYDROMORPHONE HCL 1 MG/ML IJ SOLN
0.5000 mg | Freq: Once | INTRAMUSCULAR | Status: AC
  Administered 2022-10-02: 0.5 mg via INTRAVENOUS
  Filled 2022-10-02: qty 0.5

## 2022-10-02 MED ORDER — PHENYLEPHRINE HCL-NACL 20-0.9 MG/250ML-% IV SOLN
INTRAVENOUS | Status: DC | PRN
  Administered 2022-10-02: 50 ug/min via INTRAVENOUS

## 2022-10-02 MED ORDER — OXYCODONE HCL 5 MG/5ML PO SOLN: 5.0000 mg | Freq: Once | ORAL | Status: DC | PRN

## 2022-10-02 MED ORDER — SUGAMMADEX SODIUM 200 MG/2ML IV SOLN: INTRAVENOUS | Status: DC | PRN

## 2022-10-02 MED ORDER — GABAPENTIN 300 MG PO CAPS
300.0000 mg | ORAL_CAPSULE | Freq: Every day | ORAL | Status: DC
  Administered 2022-10-02 – 2022-10-05 (×4): 300 mg via ORAL
  Filled 2022-10-02 (×5): qty 1

## 2022-10-02 MED ORDER — ACETAMINOPHEN 650 MG RE SUPP: 650.0000 mg | Freq: Four times a day (QID) | RECTAL | Status: DC | PRN

## 2022-10-02 MED ORDER — LACTATED RINGERS IV SOLN: INTRAVENOUS | Status: DC

## 2022-10-02 MED ORDER — HYDROMORPHONE HCL 1 MG/ML IJ SOLN
1.0000 mg | Freq: Once | INTRAMUSCULAR | Status: AC
  Administered 2022-10-02: 1 mg via INTRAVENOUS
  Filled 2022-10-02: qty 1

## 2022-10-02 MED ORDER — ACETAMINOPHEN 500 MG PO TABS
1000.0000 mg | ORAL_TABLET | Freq: Four times a day (QID) | ORAL | Status: AC
  Administered 2022-10-02 – 2022-10-03 (×4): 1000 mg via ORAL
  Filled 2022-10-02 (×4): qty 2

## 2022-10-02 MED ORDER — LOSARTAN POTASSIUM 50 MG PO TABS
100.0000 mg | ORAL_TABLET | Freq: Every day | ORAL | Status: DC
  Administered 2022-10-02 – 2022-10-05 (×3): 100 mg via ORAL
  Filled 2022-10-02 (×5): qty 2

## 2022-10-02 MED ORDER — LACTATED RINGERS IV SOLN: INTRAVENOUS | Status: DC | PRN

## 2022-10-02 MED ORDER — METOCLOPRAMIDE HCL 5 MG/ML IJ SOLN: 5.0000 mg | Freq: Three times a day (TID) | INTRAMUSCULAR | Status: DC | PRN

## 2022-10-02 MED ORDER — LIDOCAINE HCL (CARDIAC) PF 100 MG/5ML IV SOSY
PREFILLED_SYRINGE | INTRAVENOUS | Status: DC | PRN
  Administered 2022-10-02: 50 mg via INTRAVENOUS

## 2022-10-02 MED ORDER — FENTANYL CITRATE (PF) 100 MCG/2ML IJ SOLN: 25.0000 ug | INTRAMUSCULAR | Status: DC | PRN

## 2022-10-02 MED ORDER — SUGAMMADEX SODIUM 200 MG/2ML IV SOLN
INTRAVENOUS | Status: DC | PRN
  Administered 2022-10-02: 200 mg via INTRAVENOUS

## 2022-10-02 MED ORDER — PROMETHAZINE HCL 25 MG/ML IJ SOLN: 6.2500 mg | INTRAMUSCULAR | Status: DC | PRN

## 2022-10-02 MED ORDER — KETAMINE HCL 10 MG/ML IJ SOLN
INTRAMUSCULAR | Status: DC | PRN
  Administered 2022-10-02 (×3): 10 mg via INTRAVENOUS

## 2022-10-02 MED ORDER — DILTIAZEM HCL ER 60 MG PO CP12
120.0000 mg | ORAL_CAPSULE | Freq: Every day | ORAL | Status: DC
  Administered 2022-10-02 – 2022-10-05 (×3): 120 mg via ORAL
  Filled 2022-10-02 (×4): qty 2

## 2022-10-02 MED ORDER — DIPHENHYDRAMINE HCL 12.5 MG/5ML PO ELIX: 12.5000 mg | ORAL_SOLUTION | ORAL | Status: DC | PRN

## 2022-10-02 MED ORDER — BISACODYL 10 MG RE SUPP
10.0000 mg | Freq: Every day | RECTAL | Status: DC | PRN
  Administered 2022-10-05: 10 mg via RECTAL
  Filled 2022-10-02: qty 1

## 2022-10-02 MED ORDER — PROPOFOL 10 MG/ML IV BOLUS
INTRAVENOUS | Status: DC | PRN
  Administered 2022-10-02: 70 mg via INTRAVENOUS

## 2022-10-02 MED ORDER — OXYCODONE HCL 5 MG PO TABS: 5.0000 mg | ORAL_TABLET | Freq: Once | ORAL | Status: DC | PRN

## 2022-10-02 MED ORDER — ROCURONIUM BROMIDE 100 MG/10ML IV SOLN
INTRAVENOUS | Status: DC | PRN
  Administered 2022-10-02: 50 mg via INTRAVENOUS
  Administered 2022-10-02: 5 mg via INTRAVENOUS
  Administered 2022-10-02: 10 mg via INTRAVENOUS

## 2022-10-02 MED ORDER — DOCUSATE SODIUM 100 MG PO CAPS
100.0000 mg | ORAL_CAPSULE | Freq: Two times a day (BID) | ORAL | Status: DC
  Administered 2022-10-02 – 2022-10-05 (×6): 100 mg via ORAL
  Filled 2022-10-02 (×7): qty 1

## 2022-10-02 MED ORDER — BUPIVACAINE HCL (PF) 0.5 % IJ SOLN
INTRAMUSCULAR | Status: AC
  Filled 2022-10-02: qty 30

## 2022-10-02 MED ORDER — APIXABAN 5 MG PO TABS
5.0000 mg | ORAL_TABLET | Freq: Two times a day (BID) | ORAL | Status: DC
  Administered 2022-10-03 – 2022-10-05 (×4): 5 mg via ORAL
  Filled 2022-10-02 (×6): qty 1

## 2022-10-02 MED ORDER — ACETAMINOPHEN 10 MG/ML IV SOLN
INTRAVENOUS | Status: DC | PRN
  Administered 2022-10-02: 1000 mg via INTRAVENOUS

## 2022-10-02 MED ORDER — HYDROMORPHONE HCL 1 MG/ML IJ SOLN
1.0000 mg | INTRAMUSCULAR | Status: DC | PRN
  Administered 2022-10-02 – 2022-10-05 (×7): 1 mg via INTRAVENOUS
  Filled 2022-10-02 (×9): qty 1

## 2022-10-02 MED ORDER — CEFAZOLIN SODIUM-DEXTROSE 2-4 GM/100ML-% IV SOLN
2.0000 g | Freq: Four times a day (QID) | INTRAVENOUS | Status: AC
  Administered 2022-10-02 – 2022-10-03 (×3): 2 g via INTRAVENOUS
  Filled 2022-10-02 (×3): qty 100

## 2022-10-02 MED ORDER — KETAMINE HCL 50 MG/5ML IJ SOSY
PREFILLED_SYRINGE | INTRAMUSCULAR | Status: AC
  Filled 2022-10-02: qty 5

## 2022-10-02 MED ORDER — METOCLOPRAMIDE HCL 5 MG PO TABS: 5.0000 mg | ORAL_TABLET | Freq: Three times a day (TID) | ORAL | Status: DC | PRN

## 2022-10-02 MED ORDER — BUPIVACAINE-EPINEPHRINE (PF) 0.5% -1:200000 IJ SOLN
INTRAMUSCULAR | Status: DC | PRN
  Administered 2022-10-02: 30 mL

## 2022-10-02 MED ORDER — ACETAMINOPHEN 10 MG/ML IV SOLN: 1000.0000 mg | Freq: Once | INTRAVENOUS | Status: DC | PRN

## 2022-10-02 MED ORDER — TAMSULOSIN HCL 0.4 MG PO CAPS
0.4000 mg | ORAL_CAPSULE | Freq: Every day | ORAL | Status: DC
  Administered 2022-10-02 – 2022-10-04 (×3): 0.4 mg via ORAL
  Filled 2022-10-02 (×3): qty 1

## 2022-10-02 MED ORDER — ONDANSETRON HCL 4 MG/2ML IJ SOLN: 4.0000 mg | Freq: Four times a day (QID) | INTRAMUSCULAR | Status: DC | PRN

## 2022-10-02 MED ORDER — CEFAZOLIN SODIUM-DEXTROSE 2-3 GM-%(50ML) IV SOLR
INTRAVENOUS | Status: DC | PRN
  Administered 2022-10-02: 2 g via INTRAVENOUS

## 2022-10-02 MED ORDER — 0.9 % SODIUM CHLORIDE (POUR BTL) OPTIME
TOPICAL | Status: DC | PRN
  Administered 2022-10-02: 500 mL

## 2022-10-02 MED ORDER — ACETAMINOPHEN 10 MG/ML IV SOLN
INTRAVENOUS | Status: AC
  Filled 2022-10-02: qty 100

## 2022-10-02 MED ORDER — PHENYLEPHRINE HCL-NACL 20-0.9 MG/250ML-% IV SOLN
INTRAVENOUS | Status: AC
  Filled 2022-10-02: qty 250

## 2022-10-02 MED ORDER — ONDANSETRON HCL 4 MG/2ML IJ SOLN
INTRAMUSCULAR | Status: DC | PRN
  Administered 2022-10-02 (×2): 4 mg via INTRAVENOUS

## 2022-10-02 MED ORDER — FENTANYL CITRATE (PF) 100 MCG/2ML IJ SOLN
INTRAMUSCULAR | Status: DC | PRN
  Administered 2022-10-02: 50 ug via INTRAVENOUS
  Administered 2022-10-02 (×2): 25 ug via INTRAVENOUS

## 2022-10-02 MED ORDER — MAGNESIUM HYDROXIDE 400 MG/5ML PO SUSP: 30.0000 mL | Freq: Every day | ORAL | Status: DC | PRN

## 2022-10-02 MED ORDER — EPINEPHRINE PF 1 MG/ML IJ SOLN
INTRAMUSCULAR | Status: AC
  Filled 2022-10-02: qty 1

## 2022-10-02 MED ORDER — ISOSORBIDE MONONITRATE ER 30 MG PO TB24
60.0000 mg | ORAL_TABLET | Freq: Every day | ORAL | Status: DC
  Administered 2022-10-02 – 2022-10-05 (×4): 60 mg via ORAL
  Filled 2022-10-02: qty 1
  Filled 2022-10-02 (×4): qty 2

## 2022-10-02 MED ORDER — PROPOFOL 10 MG/ML IV BOLUS
INTRAVENOUS | Status: AC
  Filled 2022-10-02: qty 20

## 2022-10-02 MED ORDER — HYDROMORPHONE HCL 1 MG/ML IJ SOLN
INTRAMUSCULAR | Status: AC
  Filled 2022-10-02: qty 1

## 2022-10-02 MED ORDER — PHENYLEPHRINE HCL (PRESSORS) 10 MG/ML IV SOLN
INTRAVENOUS | Status: DC | PRN
  Administered 2022-10-02: 50 ug via INTRAVENOUS
  Administered 2022-10-02 (×2): 80 ug via INTRAVENOUS

## 2022-10-02 MED ORDER — FENTANYL CITRATE (PF) 100 MCG/2ML IJ SOLN
INTRAMUSCULAR | Status: AC
  Filled 2022-10-02: qty 2

## 2022-10-02 MED ORDER — AMIODARONE HCL 200 MG PO TABS
100.0000 mg | ORAL_TABLET | Freq: Every day | ORAL | Status: DC
  Administered 2022-10-02 – 2022-10-05 (×4): 100 mg via ORAL
  Filled 2022-10-02 (×5): qty 1

## 2022-10-02 MED ORDER — OLANZAPINE 5 MG PO TABS
5.0000 mg | ORAL_TABLET | Freq: Every day | ORAL | Status: DC
  Administered 2022-10-02 – 2022-10-04 (×3): 5 mg via ORAL
  Filled 2022-10-02 (×3): qty 1

## 2022-10-02 SURGICAL SUPPLY — 52 items
APL PRP STRL LF DISP 70% ISPRP (MISCELLANEOUS) ×2
BIT DRILL CROWE POINT TWST 4.3 (DRILL) IMPLANT
BNDG CMPR 5X4 CHSV STRCH STRL (GAUZE/BANDAGES/DRESSINGS) ×1
BNDG CMPR 5X6 CHSV STRCH STRL (GAUZE/BANDAGES/DRESSINGS) ×1
BNDG COHESIVE 4X5 TAN STRL LF (GAUZE/BANDAGES/DRESSINGS) ×1 IMPLANT
BNDG COHESIVE 6X5 TAN ST LF (GAUZE/BANDAGES/DRESSINGS) ×1 IMPLANT
CHLORAPREP W/TINT 26 (MISCELLANEOUS) ×2 IMPLANT
DRAPE 3/4 80X56 (DRAPES) ×1 IMPLANT
DRAPE C-ARMOR (DRAPES) ×1 IMPLANT
DRAPE STERI IOBAN 125X83 (DRAPES) IMPLANT
DRILL CROWE POINT TWIST 4.3 (DRILL) ×1
DRSG MEPILEX SACRM 8.7X9.8 (GAUZE/BANDAGES/DRESSINGS) ×1 IMPLANT
DRSG OPSITE POSTOP 3X4 (GAUZE/BANDAGES/DRESSINGS) IMPLANT
DRSG OPSITE POSTOP 4X6 (GAUZE/BANDAGES/DRESSINGS) IMPLANT
ELECT CAUTERY BLADE 6.4 (BLADE) ×1 IMPLANT
ELECT REM PT RETURN 9FT ADLT (ELECTROSURGICAL) ×1
ELECTRODE REM PT RTRN 9FT ADLT (ELECTROSURGICAL) ×1 IMPLANT
GAUZE SPONGE 4X4 12PLY STRL (GAUZE/BANDAGES/DRESSINGS) ×1 IMPLANT
GLOVE BIO SURGEON STRL SZ8 (GLOVE) ×2 IMPLANT
GLOVE INDICATOR 8.0 STRL GRN (GLOVE) ×1 IMPLANT
GOWN STRL REUS W/ TWL LRG LVL3 (GOWN DISPOSABLE) ×1 IMPLANT
GOWN STRL REUS W/ TWL XL LVL3 (GOWN DISPOSABLE) ×1 IMPLANT
GOWN STRL REUS W/TWL LRG LVL3 (GOWN DISPOSABLE) ×1
GOWN STRL REUS W/TWL XL LVL3 (GOWN DISPOSABLE) ×1
GUIDEPIN VERSANAIL DSP 3.2X444 (ORTHOPEDIC DISPOSABLE SUPPLIES) IMPLANT
GUIDEWIRE BALL NOSE 100CM (WIRE) IMPLANT
GUIDEWIRE BALL NOSE 80CM (WIRE) IMPLANT
HANDLE YANKAUER SUCT OPEN TIP (MISCELLANEOUS) ×1 IMPLANT
HFN LH 130 DEG 11MM X 380MM (Orthopedic Implant) IMPLANT
MANIFOLD NEPTUNE II (INSTRUMENTS) ×1 IMPLANT
MAT ABSORB  FLUID 56X50 GRAY (MISCELLANEOUS) ×1
MAT ABSORB FLUID 56X50 GRAY (MISCELLANEOUS) ×1 IMPLANT
NDL FILTER BLUNT 18X1 1/2 (NEEDLE) ×1 IMPLANT
NDL HYPO 22X1.5 SAFETY MO (MISCELLANEOUS) ×1 IMPLANT
NEEDLE FILTER BLUNT 18X1 1/2 (NEEDLE) ×1 IMPLANT
NEEDLE HYPO 22X1.5 SAFETY MO (MISCELLANEOUS) ×1 IMPLANT
NS IRRIG 500ML POUR BTL (IV SOLUTION) ×1 IMPLANT
PACK HIP COMPR (MISCELLANEOUS) ×1 IMPLANT
SCREW BONE CORTICAL 5.0X40 (Screw) IMPLANT
SCREW BONE CORTICAL 5.0X44 (Screw) IMPLANT
SCREW CANN THRD AFF 10.5X100 (Screw) IMPLANT
STAPLER SKIN PROX 35W (STAPLE) ×1 IMPLANT
STRAP SAFETY 5IN WIDE (MISCELLANEOUS) ×1 IMPLANT
SUT VIC AB 0 CT1 36 (SUTURE) ×1 IMPLANT
SUT VIC AB 1 CT1 36 (SUTURE) ×1 IMPLANT
SUT VIC AB 2-0 CT1 (SUTURE) ×2 IMPLANT
SYR 10ML LL (SYRINGE) ×1 IMPLANT
SYR 30ML LL (SYRINGE) ×1 IMPLANT
TAPE MICROFOAM 4IN (TAPE) ×1 IMPLANT
TRAP FLUID SMOKE EVACUATOR (MISCELLANEOUS) ×1 IMPLANT
TRAY FOLEY SLVR 16FR LF STAT (SET/KITS/TRAYS/PACK) IMPLANT
WATER STERILE IRR 500ML POUR (IV SOLUTION) ×1 IMPLANT

## 2022-10-02 NOTE — Assessment & Plan Note (Addendum)
Pt has stopped all chemo. The cause of his pathologic fracture left femur.  He is currently under hospice care

## 2022-10-02 NOTE — Anesthesia Procedure Notes (Signed)
Procedure Name: Intubation Date/Time: 10/02/2022 3:57 PM  Performed by: Jaye Beagle, CRNAPre-anesthesia Checklist: Patient identified, Emergency Drugs available, Suction available and Patient being monitored Patient Re-evaluated:Patient Re-evaluated prior to induction Oxygen Delivery Method: Circle system utilized Preoxygenation: Pre-oxygenation with 100% oxygen Induction Type: IV induction Ventilation: Mask ventilation without difficulty Laryngoscope Size: McGraph and Mac Grade View: Grade I Tube type: Oral Tube size: 7.5 mm Number of attempts: 1 Airway Equipment and Method: Stylet and Oral airway Placement Confirmation: ETT inserted through vocal cords under direct vision, positive ETCO2 and breath sounds checked- equal and bilateral Secured at: 23 cm Tube secured with: Tape Dental Injury: Teeth and Oropharynx as per pre-operative assessment

## 2022-10-02 NOTE — Transfer of Care (Signed)
Immediate Anesthesia Transfer of Care Note  Patient: Eric Beltran.  Procedure(s) Performed: INTRAMEDULLARY (IM) NAIL INTERTROCHANTERIC (Left: Hip)  Patient Location: PACU  Anesthesia Type:General  Level of Consciousness: drowsy  Airway & Oxygen Therapy: Patient Spontanous Breathing and Patient connected to face mask oxygen  Post-op Assessment: Report given to RN and Post -op Vital signs reviewed and stable  Post vital signs: Reviewed  Last Vitals:  Vitals Value Taken Time  BP 161/74 10/02/22 1807  Temp 52F   Pulse 82 10/02/22 1811  Resp 12 10/02/22 1811  SpO2 98 % 10/02/22 1811  Vitals shown include unvalidated device data.  Last Pain:  Vitals:   10/02/22 1328  TempSrc:   PainSc: Asleep         Complications: No notable events documented.

## 2022-10-02 NOTE — Assessment & Plan Note (Signed)
Pt has stopped all chemo.

## 2022-10-02 NOTE — ED Provider Notes (Signed)
[facility] Provider Note    Event Date/Time   First MD Initiated Contact with Patient 10/02/22 0028     (approximate)   History   Leg Pain   HPI  Eric Beltran. is a 82 y.o. male who presents to the ED for evaluation of Leg Pain   I review a [facility] outpatient CT chest, abdomen, pelvis with signs of lung cancer, multiple mets.  Telemedicine oncology visit from 4/19.  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  Patient presents to the ED due to atraumatic swelling and pain of the left proximal femur.  Majority of history is provided by the daughter at the bedside.  Patient's children help care for the patient, daughter recently picked up the patient last night.  He ambulated with a walker into the house without falls or injuries or complaints of pain.  Patient awoke this morning with complaints of pain after sleeping last night.  Persistent pain all day on 5/20 and inability to ambulate  Physical Exam   Triage Vital Signs: ED Triage Vitals  Enc Vitals Group     BP 10/01/22 2300 126/60     Pulse Rate 10/01/22 2300 81     Resp 10/01/22 2300 18     Temp 10/01/22 2300 98.4 F (36.9 C)     Temp Source 10/01/22 2300 Oral     SpO2 10/01/22 2300 94 %     Weight 10/01/22 2302 145 lb (65.8 kg)     Height 10/01/22 2300 5\' 7"  (1.702 m)     Head Circumference --      Peak Flow --      Pain Score 10/01/22 2303 3     Pain Loc --      Pain Edu? --      Excl. in GC? --     Most recent vital signs: Vitals:   10/02/22 0500 10/02/22 0600  BP: 136/65 138/76  Pulse: 80 82  Resp: 19 20  Temp:    SpO2: 100% 97%    General: Awake, no distress.  CV:  Good peripheral perfusion.  Resp:  Normal effort.  Abd:  No distention.  MSK:  Obvious close deformity of the left proximal femur.  Left foot is distally neurovascularly intact. Neuro:  No focal deficits appreciated. Other:     ED Results / Procedures / Treatments    Labs (all labs ordered are listed, but only abnormal results are displayed) Labs Reviewed  CBC WITH DIFFERENTIAL/PLATELET - Abnormal; Notable for the following components:      Result Value   WBC 15.4 (*)    RBC 3.25 (*)    Hemoglobin 9.0 (*)    HCT 29.2 (*)    RDW 18.0 (*)    Neutro Abs 12.4 (*)    Monocytes Absolute 1.8 (*)    Abs Immature Granulocytes 0.11 (*)    All other components within normal limits  BASIC METABOLIC PANEL - Abnormal; Notable for the following components:   Glucose, Bld 144 (*)    Calcium 8.2 (*)    All other components within normal limits  PROTIME-INR - Abnormal; Notable for the following components:   Prothrombin Time 17.5 (*)    INR 1.4 (*)    All other components within normal limits  APTT - Abnormal; Notable for the following components:   aPTT 46 (*)    All other components within normal limits  TYPE AND SCREEN    EKG  RADIOLOGY I reviewed plain films of the left femur and hip with a displaced fracture of the left proximal femoral diaphysis.  Official radiology report(s): DG FEMUR MIN 2 VIEWS LEFT  Result Date: 10/02/2022 CLINICAL DATA:  Left thigh pain, tenderness EXAM: LEFT FEMUR 2 VIEWS COMPARISON:  None Available. FINDINGS: There is an acute, oblique, overriding fracture of the proximal left femoral diaphysis with roughly 5 cm override and marked varus angulation of the distal fracture fragment. Left femoral head is still seated within the left acetabulum. Mild left hip degenerative arthritis is present. Distal left femur appears intact. Advanced vascular calcifications are noted. IMPRESSION: 1. Acute, oblique, overriding fracture of the proximal left femoral diaphysis. Electronically Signed   By: Helyn Numbers M.D.   On: 10/02/2022 00:03   DG Hip Unilat W or Wo Pelvis 2-3 Views Left  Result Date: 10/02/2022 CLINICAL DATA:  Left thigh pain EXAM: DG HIP (WITH OR WITHOUT PELVIS) 2-3V LEFT COMPARISON:  None Available. FINDINGS: An acute,  oblique fracture of the proximal left femoral diaphysis is present with 5 cm override at, 1 shaft with lateral displacement, and marked varus angulation of the distal fracture fragment. The proximal left femur is still seated within the left acetabulum. Left hip joint space is preserved. Pelvis is intact. Right total hip arthroplasty has been performed. Advanced vascular calcifications are noted. IMPRESSION: 1. Acute, oblique fracture of the proximal left femoral diaphysis with displacement and varus angulation. Electronically Signed   By: Helyn Numbers M.D.   On: 10/02/2022 00:01    PROCEDURES and INTERVENTIONS:  Procedures  Medications  lactated ringers infusion ( Intravenous New Bag/Given 10/02/22 0445)  HYDROmorphone (DILAUDID) injection 1 mg (has no administration in time range)  diltiazem (CARDIZEM SR) 12 hr capsule 120 mg (has no administration in time range)  losartan (COZAAR) tablet 100 mg (has no administration in time range)  tamsulosin (FLOMAX) capsule 0.4 mg (has no administration in time range)  acetaminophen (TYLENOL) tablet 650 mg (has no administration in time range)    Or  acetaminophen (TYLENOL) suppository 650 mg (has no administration in time range)  ondansetron (ZOFRAN) tablet 4 mg (has no administration in time range)    Or  ondansetron (ZOFRAN) injection 4 mg (has no administration in time range)  HYDROmorphone (DILAUDID) injection 0.5 mg (0.5 mg Intravenous Given 10/02/22 0125)  HYDROmorphone (DILAUDID) injection 1 mg (1 mg Intravenous Given 10/02/22 0157)     IMPRESSION / MDM / ASSESSMENT AND PLAN / ED COURSE  I reviewed the triage vital signs and the nursing notes.  Differential diagnosis includes, but is not limited to, abscess, hematoma, fracture, dislocation  {Patient presents with symptoms of an acute illness or injury that is potentially life-threatening.  Pleasant 82 year old male on home hospice presents with atraumatic pathologic fracture of his femur  requiring admission for pain control and orthopedic palliative fixation.  X-ray confirms fracture.  Screening blood work is sent and pending at the time of admission to help with preoperative screening but he has no preceding medical acute complaints.  He is on home hospice and recently stopped chemotherapy to be at home with family.  Suspect palliative fixation to be reasonable and orthopedics agrees to see the patient tomorrow.  Admitted to medicine  Clinical Course as of 10/02/22 8469  Tue Oct 02, 2022  0134 I consult with Dr. Joice Lofts. He reviews images and agrees fixation is possible and will be helpful. Requests npo [DS]    Clinical Course User Index [DS] Delton Prairie,  MD     FINAL CLINICAL IMPRESSION(S) / ED DIAGNOSES   Final diagnoses:  Pathological fracture in neoplastic disease, left femur, initial encounter for fracture [facility])     Rx / DC Orders   ED Discharge Orders     None        Note:  This document was prepared using Dragon voice recognition software and may include unintentional dictation errors.   Delton Prairie, MD 10/02/22 [phone]

## 2022-10-02 NOTE — ED Notes (Signed)
Pt changed into gown.

## 2022-10-02 NOTE — Anesthesia Preprocedure Evaluation (Signed)
Anesthesia Evaluation  Patient identified by MRN, date of birth, ID band Patient awake    Reviewed: Allergy & Precautions, H&P , NPO status , Patient's Chart, lab work & pertinent test results, reviewed documented beta blocker date and time   Airway Mallampati: II  TM Distance: >3 FB Neck ROM: full    Dental  (+) Teeth Intact   Pulmonary neg pulmonary ROS   Pulmonary exam normal        Cardiovascular Exercise Tolerance: Poor hypertension, On Medications + angina with exertion + CAD, + Past MI, + Peripheral Vascular Disease and +CHF  Normal cardiovascular exam+ Valvular Problems/Murmurs  Rhythm:regular Rate:Normal     Neuro/Psych negative neurological ROS  negative psych ROS   GI/Hepatic negative GI ROS, Neg liver ROS,,,  Endo/Other  negative endocrine ROS    Renal/GU negative Renal ROS  negative genitourinary   Musculoskeletal   Abdominal   Peds  Hematology negative hematology ROS (+)   Anesthesia Other Findings Past Medical History: No date: Arthritis No date: Ascending aortic aneurysm (HCC)     Comment:  Status post repair 12/2011 at [facility] No date: BPH (benign prostatic hyperplasia) No date: Chronic diastolic CHF (congestive heart failure) (HCC) No date: Coronary atherosclerosis of native coronary artery     Comment:  a. Multivessel status post prior stenting and ultimately              CABG 12/2011 at [facility] with with LIMA to LAD, SVG to PLB,               SVG to ramus, SVG to OM . b. Last cath 06/2014 (following               ischemic nuc) -> medical therapy, no feasible way to               revascularize LCx territory. No date: Essential hypertension No date: Mixed hyperlipidemia No date: Myocardial infarction (HCC) No date: PAD (peripheral artery disease) (HCC) No date: PAF (paroxysmal atrial fibrillation) (HCC) No date: Supraventricular tachycardia Past Surgical History: 12/2011 [facility]: ASCENDING  AORTIC ANEURYSM REPAIR     Comment:  26 mm.Dacron graft 12/2011 [facility]: CORONARY ARTERY BYPASS GRAFT     Comment:  LIMA to LAD, SVG to PLB, SVG to ramus, SVG to OM 08/06/2017: CORONARY ARTERY BYPASS GRAFT; N/A     Comment:  Procedure: REDO CORONARY ARTERY BYPASS GRAFTING (CABG)               TIMES FOUR UTILIZING LEFT GREATER SAPHENOUS VEIN HAVESTED              ENDOVASCULARLY, LEFT RADIAL ARTERY.  RIGHT AXILLARY               CANNULATION;  Surgeon: Alleen Borne, MD;  Location: [facility]              OR;  Service: Open Heart Surgery;  Laterality: N/A; 02/10/2020: CORONARY ATHERECTOMY; N/A     Comment:  Procedure: CORONARY ATHERECTOMY;  Surgeon: Tonny Bollman, MD;  Location: [facility] INVASIVE CV LAB;  Service:               Cardiovascular;  Laterality: N/A; 06/10/2020: CORONARY BALLOON ANGIOPLASTY; N/A     Comment:  Procedure: CORONARY BALLOON ANGIOPLASTY;  Surgeon:               Iran Ouch, MD;  Location: [facility] INVASIVE CV LAB;  Service: Cardiovascular;  Laterality: N/A; 02/10/2020: CORONARY STENT INTERVENTION; N/A     Comment:  Procedure: CORONARY STENT INTERVENTION;  Surgeon:               Tonny Bollman, MD;  Location: [facility] INVASIVE CV LAB;                Service: Cardiovascular;  Laterality: N/A; 07/26/2021: CORONARY STENT INTERVENTION; N/A     Comment:  Procedure: CORONARY STENT INTERVENTION;  Surgeon: Iran Ouch, MD;  Location: [facility] INVASIVE CV LAB;  Service:               Cardiovascular;  Laterality: N/A;  RCA 07/26/2021: CORONARY ULTRASOUND/IVUS; N/A     Comment:  Procedure: Intravascular Ultrasound/IVUS;  Surgeon:               Iran Ouch, MD;  Location: [facility] INVASIVE CV LAB;                Service: Cardiovascular;  Laterality: N/A;  OCT No date: HERNIA REPAIR 09/19/2021: IR REMOVAL TUN CV CATH W/O FL 07/11/2017: LEFT HEART CATH AND CORS/GRAFTS ANGIOGRAPHY; N/A     Comment:  Procedure: LEFT HEART CATH AND CORS/GRAFTS ANGIOGRAPHY;                Surgeon: Lennette Bihari, MD;  Location: [facility] INVASIVE CV               LAB;  Service: Cardiovascular;  Laterality: N/A; 09/12/2018: LEFT HEART CATH AND CORS/GRAFTS ANGIOGRAPHY; N/A     Comment:  Procedure: LEFT HEART CATH AND CORS/GRAFTS ANGIOGRAPHY;               Surgeon: Lennette Bihari, MD;  Location: [facility] INVASIVE CV               LAB;  Service: Cardiovascular;  Laterality: N/A; 02/10/2020: LEFT HEART CATH AND CORS/GRAFTS ANGIOGRAPHY; N/A     Comment:  Procedure: LEFT HEART CATH AND CORS/GRAFTS ANGIOGRAPHY;               Surgeon: Tonny Bollman, MD;  Location: [facility] INVASIVE CV               LAB;  Service: Cardiovascular;  Laterality: N/A; 06/10/2020: LEFT HEART CATH AND CORS/GRAFTS ANGIOGRAPHY; N/A     Comment:  Procedure: LEFT HEART CATH AND CORS/GRAFTS ANGIOGRAPHY;               Surgeon: Iran Ouch, MD;  Location: [facility] INVASIVE CV              LAB;  Service: Cardiovascular;  Laterality: N/A; 07/26/2021: LEFT HEART CATH AND CORS/GRAFTS ANGIOGRAPHY; N/A     Comment:  Procedure: LEFT HEART CATH AND CORS/GRAFTS ANGIOGRAPHY;               Surgeon: Iran Ouch, MD;  Location: [facility] INVASIVE CV              LAB;  Service: Cardiovascular;  Laterality: N/A; 06/18/2014: LEFT HEART CATHETERIZATION WITH CORONARY ANGIOGRAM; N/A     Comment:  Procedure: LEFT HEART CATHETERIZATION WITH CORONARY               ANGIOGRAM;  Surgeon: Micheline Chapman, MD;  Location: [facility]               CATH LAB;  Service: Cardiovascular;  Laterality: N/A; 08/06/2017: RADIAL ARTERY HARVEST; Left  Comment:  Procedure: RADIAL ARTERY HARVEST;  Surgeon: Alleen Borne, MD;  Location: [facility] OR;  Service: Open Heart               Surgery;  Laterality: Left; 08/06/2017: TEE WITHOUT CARDIOVERSION; N/A     Comment:  Procedure: TRANSESOPHAGEAL ECHOCARDIOGRAM (TEE);                Surgeon: Alleen Borne, MD;  Location: [facility] OR;  Service:              Open Heart Surgery;  Laterality: N/A; 02/07/2016: TOTAL HIP  ARTHROPLASTY; Right     Comment:  Procedure: RIGHT TOTAL HIP ARTHROPLASTY ANTERIOR               APPROACH;  Surgeon: Kathryne Hitch, MD;                Location: [facility] OR;  Service: Orthopedics;  Laterality:               Right; No date: TRANSURETHRAL RESECTION OF PROSTATE BMI    Body Mass Index: 22.72 kg/m     Reproductive/Obstetrics negative OB ROS                             Anesthesia Physical Anesthesia Plan  ASA: 3 and emergent  Anesthesia Plan: General ETT   Post-op Pain Management:    Induction:   PONV Risk Score and Plan: 3  Airway Management Planned:   Additional Equipment:   Intra-op Plan:   Post-operative Plan:   Informed Consent: I have reviewed the patients History and Physical, chart, labs and discussed the procedure including the risks, benefits and alternatives for the proposed anesthesia with the patient or authorized representative who has indicated his/her understanding and acceptance.     Dental Advisory Given  Plan Discussed with: CRNA  Anesthesia Plan Comments:        Anesthesia Quick Evaluation

## 2022-10-02 NOTE — Assessment & Plan Note (Signed)
Hold plaivx. Dtr states pt taken off statin by PCP 1-2 weeks ago.

## 2022-10-02 NOTE — Assessment & Plan Note (Signed)
Stable. On cozaar and cardizem.

## 2022-10-02 NOTE — Hospital Course (Addendum)
Taken from H&P.  83 year old male history of hypertension, paroxysmal atrial fibrillation on chronic anticoagulation, hypertension, BPH, hyperlipidemia, history of right above-the-knee amputation secondary to sarcoma with now metastatic sarcoma to the lung and bone presents to the ER today with left leg pain that started around 8 or 9:00.  No fall.  Also associated with edema of left thigh.  ED course.  Vital stable.  Labs with WBC of 15.4, hemoglobin 9, INR 1.4. Left femur x-ray showed an acute oblique fracture of the proximal left femoral diaphysis with displacement and varus angulation. There is approximately a 5 cm override and marked varus angulation of the distal fracture fragment.   Orthopedic was consulted.  5/21: Vital stable.  Last dose of Eliquis was on 5/20 p.m. pending orthopedic consult and going to the OR later today.  Patient is currently under hospice care at home for metastatic sarcoma and not a candidate for continuation of treatment.  5/22: Vital stable.  Some improvement in leukocytosis and hemoglobin with slight decreased to 8.5.  Patient underwent ORIF yesterday, the procedure well.  PT is recommending SNF  5/23: Remained stable.  Wants to go home.  Discussed with daughter and they might hold his hospice privileges in order to get home health services with PT and OT.  They will let us know about their decision.  Daughter requesting to keep for another day so he can work little more with PT while in the hospital.  Having some oozing from one of the wound as he is on Eliquis and Plavix.

## 2022-10-02 NOTE — Subjective & Objective (Signed)
CC: left leg pain HPI:  82 year old male history of hypertension, paroxysmal atrial fibrillation on chronic anticoagulation, hypertension, BPH, hyperlipidemia, history of right above-the-knee amputation secondary to sarcoma with now metastatic sarcoma to the lung and bone presents to the ER today with leg pain that started around 8 or 9:00.  Patient been sitting in his house watching TV.  Family had to use the wheelchair to get him to bed.  He started having pain in his left leg.  They noticed increasing size of his left thigh.  He was brought to the ER for evaluation.  Patient denies any recent falls or trauma to the left leg.  Temp 98.4 heart rate 81 blood pressure 126/60.  White count 15.4, hemoglobin 9.0, platelets of 336  Sodium 135, potassium 3.9, chloride 102, bicarb 24, BUN of 15, creatinine 0.7, glucose 144  INR 1.4  Left femur x-ray showed an acute oblique fracture of the proximal left femoral diaphysis with displacement and varus angulation.  There is approximately a 5 cm override and marked varus angulation of the distal fracture fragment.  EDP is discussed the case with orthopedics who will see the patient in the morning.  [facility] hospitalist contacted for admission.

## 2022-10-02 NOTE — H&P (Addendum)
History and Physical    Eric Beltran. 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  Patient been sitting in his house watching TV.  Family had to use the wheelchair to get him to bed.  He started having pain in his left leg.  They noticed increasing size of his left thigh.  He was brought to the ER for evaluation.  Patient denies any recent falls or trauma to the left leg.  Temp 98.4 heart rate 81 blood pressure 126/60.  White count 15.4, hemoglobin 9.0, platelets of 336  Sodium 135, potassium 3.9, chloride 102, bicarb 24, BUN of 15, creatinine 0.7, glucose 144  INR 1.4  Left femur x-ray showed an acute oblique fracture of the proximal left femoral diaphysis with displacement and varus angulation.  There is approximately a 5 cm override and marked varus angulation of the distal fracture fragment.  EDP is discussed the case with orthopedics who will see the patient in the morning.  [facility] hospitalist contacted for admission.   ED Course: left femur xr show fracture  Review of Systems:  Review of Systems  Constitutional: Negative.   HENT: Negative.    Eyes: Negative.   Respiratory: Negative.    Cardiovascular: Negative.   Gastrointestinal: Negative.   Genitourinary: Negative.   Musculoskeletal:        Left thigh pain and swelling.  Skin: Negative.   Neurological: Negative.   Endo/Heme/Allergies: Negative.   Psychiatric/Behavioral: Negative.     All other systems reviewed and are negative.   Past Medical History:  Diagnosis Date   Arthritis    Ascending aortic aneurysm (HCC)    Status post repair 12/2011 at [facility]   BPH (benign prostatic hyperplasia)    Chronic diastolic CHF (congestive heart failure) (HCC)    Coronary atherosclerosis of native coronary artery    a. Multivessel status post prior stenting and ultimately CABG 12/2011 at [facility] with with LIMA to LAD, SVG to PLB, SVG to ramus, SVG to OM . b. Last cath 06/2014 (following ischemic nuc) -> medical therapy, no feasible way to revascularize LCx territory.   Essential hypertension    Mixed hyperlipidemia    Myocardial infarction [facility])    PAD (peripheral artery disease) (HCC)    PAF (paroxysmal atrial fibrillation) (HCC)    Supraventricular tachycardia     Past Surgical History:  Procedure Laterality Date   ASCENDING AORTIC ANEURYSM REPAIR  12/2011 [facility]   26 mm.Dacron graft   CORONARY ARTERY BYPASS GRAFT  12/2011 [facility]   LIMA to LAD, SVG to PLB, SVG to ramus, SVG to OM   CORONARY ARTERY BYPASS GRAFT N/A 08/06/2017   Procedure: REDO CORONARY ARTERY BYPASS GRAFTING (CABG) TIMES FOUR UTILIZING LEFT GREATER SAPHENOUS VEIN HAVESTED ENDOVASCULARLY, LEFT RADIAL ARTERY.  RIGHT AXILLARY CANNULATION;  Surgeon: Alleen Borne, MD;  Location: [facility] OR;  Service: Open Heart Surgery;  Laterality: N/A;   CORONARY ATHERECTOMY N/A 02/10/2020   Procedure: CORONARY ATHERECTOMY;  Surgeon: Tonny Bollman, MD;  Location: [facility] INVASIVE  CV LAB;  Service: Cardiovascular;  Laterality: N/A;   CORONARY BALLOON ANGIOPLASTY N/A 06/10/2020   Procedure: CORONARY BALLOON ANGIOPLASTY;  Surgeon: Iran Ouch, MD;  Location: [facility] INVASIVE CV LAB;  Service: Cardiovascular;  Laterality: N/A;   CORONARY STENT INTERVENTION N/A 02/10/2020   Procedure: CORONARY STENT INTERVENTION;  Surgeon: Tonny Bollman, MD;  Location: [facility] INVASIVE CV LAB;  Service: Cardiovascular;  Laterality: N/A;   CORONARY STENT  INTERVENTION N/A 07/26/2021   Procedure: CORONARY STENT INTERVENTION;  Surgeon: Iran Ouch, MD;  Location: [facility] INVASIVE CV LAB;  Service: Cardiovascular;  Laterality: N/A;  RCA   CORONARY ULTRASOUND/IVUS N/A 07/26/2021   Procedure: Intravascular Ultrasound/IVUS;  Surgeon: Iran Ouch, MD;  Location: [facility] INVASIVE CV LAB;  Service: Cardiovascular;  Laterality: N/A;  OCT   HERNIA REPAIR     IR REMOVAL TUN CV CATH W/O FL  09/19/2021   LEFT HEART CATH AND CORS/GRAFTS ANGIOGRAPHY N/A 07/11/2017   Procedure: LEFT HEART CATH AND CORS/GRAFTS ANGIOGRAPHY;  Surgeon: Lennette Bihari, MD;  Location: [facility] INVASIVE CV LAB;  Service: Cardiovascular;  Laterality: N/A;   LEFT HEART CATH AND CORS/GRAFTS ANGIOGRAPHY N/A 09/12/2018   Procedure: LEFT HEART CATH AND CORS/GRAFTS ANGIOGRAPHY;  Surgeon: Lennette Bihari, MD;  Location: [facility] INVASIVE CV LAB;  Service: Cardiovascular;  Laterality: N/A;   LEFT HEART CATH AND CORS/GRAFTS ANGIOGRAPHY N/A 02/10/2020   Procedure: LEFT HEART CATH AND CORS/GRAFTS ANGIOGRAPHY;  Surgeon: Tonny Bollman, MD;  Location: [facility] INVASIVE CV LAB;  Service: Cardiovascular;  Laterality: N/A;   LEFT HEART CATH AND CORS/GRAFTS ANGIOGRAPHY N/A 06/10/2020   Procedure: LEFT HEART CATH AND CORS/GRAFTS ANGIOGRAPHY;  Surgeon: Iran Ouch, MD;  Location: [facility] INVASIVE CV LAB;  Service: Cardiovascular;  Laterality: N/A;   LEFT HEART CATH AND CORS/GRAFTS ANGIOGRAPHY N/A 07/26/2021   Procedure: LEFT HEART CATH AND CORS/GRAFTS ANGIOGRAPHY;  Surgeon: Iran Ouch, MD;  Location: [facility] INVASIVE CV LAB;  Service: Cardiovascular;  Laterality: N/A;   LEFT HEART CATHETERIZATION WITH CORONARY ANGIOGRAM N/A 06/18/2014   Procedure: LEFT HEART CATHETERIZATION WITH CORONARY ANGIOGRAM;  Surgeon: Micheline Chapman, MD;  Location: [facility] CATH LAB;  Service: Cardiovascular;  Laterality: N/A;   RADIAL ARTERY HARVEST Left 08/06/2017   Procedure: RADIAL ARTERY HARVEST;  Surgeon: Alleen Borne, MD;  Location: [facility] OR;  Service:  Open Heart Surgery;  Laterality: Left;   TEE WITHOUT CARDIOVERSION N/A 08/06/2017   Procedure: TRANSESOPHAGEAL ECHOCARDIOGRAM (TEE);  Surgeon: Alleen Borne, MD;  Location: [facility] OR;  Service: Open Heart Surgery;  Laterality: N/A;   TOTAL HIP ARTHROPLASTY Right 02/07/2016   Procedure: RIGHT TOTAL HIP ARTHROPLASTY ANTERIOR APPROACH;  Surgeon: Kathryne Hitch, MD;  Location: [facility] OR;  Service: Orthopedics;  Laterality: Right;   TRANSURETHRAL RESECTION OF PROSTATE       reports that he has never smoked. He has never been exposed to tobacco smoke. He has never used smokeless tobacco. He reports current alcohol use. He reports that he does not use drugs.  No Known Allergies  Family History  Problem Relation Age of Onset   Hypertension Father    Parkinson's disease Father    Hypertension Mother    Lung cancer Sister     Prior to Admission medications   Medication Sig Start Date End Date Taking? Authorizing Provider  apixaban (ELIQUIS) 5 MG TABS tablet TAKE 1 TABLET BY MOUTH TWICE DAILY 08/30/22   Jonelle Sidle, MD  Ascorbic Acid (VITAMIN C) 500 MG CAPS Take 1 tablet by mouth daily.  [provider]  atorvastatin (LIPITOR) 80 MG tablet TAKE 1 TABLET BY MOUTH AT BEDTIME 08/22/22   Jonelle Sidle, MD  clopidogrel (PLAVIX) 75 MG tablet TAKE 1 TABLET BY MOUTH DAILY WITH BREAKFAST 09/20/22   Jonelle Sidle, MD  diltiazem (CARDIZEM SR) 120 MG 12 hr capsule Take 1 capsule (120 mg total) by mouth daily. 05/21/22   Sharlene Dory, NP  furosemide (LASIX) 20 MG tablet TAKE 2 TABLETS BY MOUTH DAILY AS NEEDED FOR EDEMA OR FLUID 09/26/22   Sharlene Dory, NP  glipiZIDE (GLUCOTROL) 5 MG tablet Take 1 tablet (5 mg total) by mouth daily before breakfast. 09/25/21 01/04/23  Leatha Gilding, MD  isosorbide mononitrate (IMDUR) 60 MG 24 hr tablet TAKE 1 TABLET BY MOUTH TWICE DAILY every morning AND in THE evening 05/28/22   Jonelle Sidle, MD  losartan (COZAAR) 50 MG tablet Take 2 tablets  (100 mg total) by mouth daily. 09/20/22   Jonelle Sidle, MD  metFORMIN (GLUCOPHAGE) 500 MG tablet Take 1 tablet (500 mg total) by mouth 2 (two) times daily with a meal. 09/25/21 09/25/22  Leatha Gilding, MD  Multiple Vitamin (MULTIVITAMIN) tablet Take 1 tablet by mouth at bedtime.    [provider]  nitroGLYCERIN (NITROSTAT) 0.4 MG SL tablet DISSOLVE 1 TABLET UNDER THE TONGUE EVERY 5 MINUTES AS NEEDED FOR CHEST PAIN. DO NOT EXCEED A TOTAL OF 3 DOSES IN 15 MINUTES. Patient taking differently: Place 0.4 mg under the tongue every 5 (five) minutes as needed for chest pain. 06/13/20   Jonelle Sidle, MD  Omega-3 Fatty Acids (FISH OIL) 1000 MG CAPS Take 2,000 mg by mouth daily.    [provider]  pazopanib (VOTRIENT) 200 MG tablet Take 400 mg by mouth daily.    [provider]  spironolactone (ALDACTONE) 50 MG tablet Take 50 mg by mouth daily.    [provider]  tamsulosin (FLOMAX) 0.4 MG CAPS capsule Take 0.4 mg by mouth at bedtime.    [provider]  valACYclovir (VALTREX) 500 MG tablet Take 500 mg by mouth daily as needed (fever blisters).  01/21/20   [provider]  zinc gluconate 50 MG tablet Take 50 mg by mouth at bedtime.    [provider]    Physical Exam: Vitals:   10/01/22 2300 10/01/22 2302 10/02/22 0145 10/02/22 0200  BP: 126/60   135/72  Pulse: 81   83  Resp: 18     Temp: 98.4 F (36.9 C)     TempSrc: Oral     SpO2: 94%  96% 97%  Weight:  65.8 kg    Height: 5\' 7"  (1.702 m)       Physical Exam Vitals and nursing note reviewed.  Constitutional:      General: He is not in acute distress.    Appearance: He is not toxic-appearing or diaphoretic.  HENT:     Head: Normocephalic and atraumatic.  Cardiovascular:     Rate and Rhythm: Normal rate and regular rhythm.  Pulmonary:     Effort: Pulmonary effort is normal. No respiratory distress.     Breath sounds: No wheezing.  Abdominal:     General: Abdomen is  flat. Bowel sounds are normal. There is no distension.  Musculoskeletal:     Comments: Right AKA  Left femur angulated. Swelling noted in proximal thigh  Skin:    General: Skin is warm and dry.     Capillary Refill: Capillary refill takes less than  2 seconds.  Neurological:     Mental Status: He is alert and oriented to person, place, and time.      Labs on Admission: I have personally reviewed following labs and imaging studies  CBC: Recent Labs  Lab 10/02/22 0154  WBC 15.4*  NEUTROABS 12.4*  HGB 9.0*  HCT 29.2*  MCV 89.8  PLT 336   Basic Metabolic Panel: Recent Labs  Lab 10/02/22 0154  NA 135  K 3.9  CL 102  CO2 24  GLUCOSE 144*  BUN 15  CREATININE 0.73  CALCIUM 8.2*   GFR: Estimated Creatinine Clearance: 68.5 mL/min (by C-G formula based on SCr of 0.73 mg/dL).  Coagulation Profile: Recent Labs  Lab 10/02/22 0154  INR 1.4*   Radiological Exams on Admission: I have personally reviewed images DG FEMUR MIN 2 VIEWS LEFT  Result Date: 10/02/2022 CLINICAL DATA:  Left thigh pain, tenderness EXAM: LEFT FEMUR 2 VIEWS COMPARISON:  None Available. FINDINGS: There is an acute, oblique, overriding fracture of the proximal left femoral diaphysis with roughly 5 cm override and marked varus angulation of the distal fracture fragment. Left femoral head is still seated within the left acetabulum. Mild left hip degenerative arthritis is present. Distal left femur appears intact. Advanced vascular calcifications are noted. IMPRESSION: 1. Acute, oblique, overriding fracture of the proximal left femoral diaphysis. Electronically Signed   By: Helyn Numbers M.D.   On: 10/02/2022 00:03   DG Hip Unilat W or Wo Pelvis 2-3 Views Left  Result Date: 10/02/2022 CLINICAL DATA:  Left thigh pain EXAM: DG HIP (WITH OR WITHOUT PELVIS) 2-3V LEFT COMPARISON:  None Available. FINDINGS: An acute, oblique fracture of the proximal left femoral diaphysis is present with 5 cm override at, 1 shaft with  lateral displacement, and marked varus angulation of the distal fracture fragment. The proximal left femur is still seated within the left acetabulum. Left hip joint space is preserved. Pelvis is intact. Right total hip arthroplasty has been performed. Advanced vascular calcifications are noted. IMPRESSION: 1. Acute, oblique fracture of the proximal left femoral diaphysis with displacement and varus angulation. Electronically Signed   By: Helyn Numbers M.D.   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 Last dose of Eliquis was about 8 pm on 10-01-2022. Pt still on plavix. Npo. EDP has consulted orthopedics. Prn IV dilaudid for pain. IVF.  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 The cause of his pathologic fracture left femur.  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  Paroxysmal atrial fibrillation (HCC) Stable. Holding Eliquis for now.  Essential hypertension Stable. On cozaar and cardizem.  Coronary artery disease involving coronary bypass graft of native heart with angina pectoris (HCC) Hold plaivx. Dtr states pt taken off statin by PCP 1-2 weeks ago.   DVT prophylaxis:  none . Allow Eliquis to wear off Code Status: Full Code Family Communication: discussed with pt and dtr sarah.  Disposition Plan: return home  Consults called: EDP has  consulted orthopedics  Admission status: Inpatient, Med-Surg   Carollee Herter, DO [facility] 10/02/2022, 3:15 AM

## 2022-10-02 NOTE — Assessment & Plan Note (Addendum)
Admit to med/surg bed. Last dose of Eliquis was about 8 pm on 10-01-2022. Pt still on plavix. Npo. EDP has consulted orthopedics. Prn IV dilaudid for pain. IVF. Pending formal orthopedic consult-most likely be going to OR later today

## 2022-10-02 NOTE — Op Note (Signed)
10/02/2022  5:54 PM  Patient:   Eric Beltran.  Pre-Op Diagnosis:   Displaced pathologic left proximal femur fracture  Post-Op Diagnosis:   Same  Procedure:   Reduction and internal fixation of displaced pathologic left proximal femur fracture with Biomet Affixis TFN nail.  Surgeon:   Maryagnes Amos, MD  Assistant:   None  Anesthesia:   GET  Findings:   As above  Complications:   None  EBL:   50 cc  Fluids:   600 cc crystalloid  UOP:   None  TT:   None  Drains:   None  Closure:   Staples  Implants:   Biomet Affixis 11 x 380 mm TFN with a 100 mm lag screw and two distal interlocking screws  Brief Clinical Note:   The patient is an 82 year old male with a metastatic sarcoma of unknown primary who sustained a left proximal femur fracture yesterday evening without any obvious injury. He was brought to the emergency room where x-rays demonstrated the above-noted injury with concern for a pathologic fracture. The patient has been cleared medically and presents at this time for reduction and internal fixation of the displaced pathologic left proximal femur fracture.  Procedure:   The patient was brought into the operating room and lain in the supine position. After adequate general endotracheal intubation and anesthesia were obtained, the patient was repositioned on the fracture table. The injured lower extremity was placed in longitudinal traction. The fracture was reduced using longitudinal traction and internal rotation. The adequacy of reduction was verified fluoroscopically in AP and lateral projections and found to be near anatomic. The lateral aspects of the left hip and thigh were prepped with ChloraPrep solution before being draped sterilely. Preoperative antibiotics were administered. A timeout was performed to verify the appropriate surgical site.   The greater trochanter was identified fluoroscopically and an approximately 3 cm incision made about 2-3 fingerbreadths  above the tip of the greater trochanter. The incision was carried down through the subcutaneous tissues to expose the gluteal fascia. This was split the length of the incision, providing access to the tip of the trochanter. Under fluoroscopic guidance, a guidewire was drilled through the tip of the trochanter into the proximal metaphysis to the level of the lesser trochanter. After verifying its position fluoroscopically in AP and lateral projections, it was overreamed with the initial reamer to the depth of the lesser trochanter.    After several attempts and using the "finger", a guidewire was passed down across the fracture and into the distal fragment, then advanced down into the supracondylar region. The adequacy of guidewire position was verified fluoroscopically in AP and lateral projections before the length of the guidewire within the canal was measured and found to be 385 mm. Therefore, a 380 mm length nail was selected. The guidewire was overreamed sequentially using the flexible reamers, beginning with a 11.5 mm reamer and progressing to a 12.5 mm reamer. This provided good cortical chatter. The 11 x 380 mm Biomet Affixis TFN rod was selected and advanced to the appropriate depth, as verified fluoroscopically.   The guide system for the lag screw was positioned and advanced through an approximately 2 cm stab incision over the lateral aspect of the proximal femur. The guidewire was drilled up through the trochanteric femoral nail and into the femoral neck to rest within 5 mm of subchondral bone. After verifying its position in the femoral neck and head in both AP and lateral projections, the guidewire  was measured and found to be optimally replicated by a 100 mm lag screw. The guidewire was overreamed to the appropriate depth before the lag screw was inserted and advanced to the appropriate depth as verified fluoroscopically in AP and lateral projections. The locking screw was advanced, then backed  off a quarter turn to set the lag screw. Again the adequacy of hardware position and fracture reduction was verified fluoroscopically in AP and lateral projections and found to be excellent.  Attention was directed distally. Using the "perfect circle" technique, the leg and fluoroscopy machine were positioned appropriately. An approximately 1.5 cm stab incision was made over the skin at the appropriate point before the drill bit was advanced through the cortex and across the static hole of the nail. The appropriate length of the screw was determined before the 40 mm distal interlocking screw was positioned, then advanced and tightened securely. Again the adequacy of screw position was verified fluoroscopically in AP and lateral projections and found to be excellent.   Given that this was a femoral shaft fracture, it was felt best to place a second distal interlocking screw. Therefore, a second stab incision was made over the dynamic hole along the lateral aspect of the distal thigh. Again using the "perfect circle" technique, the drill bit was positioned and advanced through the cortex and across the dynamic hole. Initial measurements suggested that a second 40 mm screw would be appropriate. However, this proved to be too short so it was replaced with a 44 mm distal interlocking screw.  Once again, the adequacy of screw position was verified fluoroscopically in AP and lateral injections and found to be excellent.  The wounds were irrigated thoroughly with sterile saline solution before the abductor fascia was reapproximated using #0 Vicryl interrupted sutures. The subcutaneous tissues were closed using 2-0 Vicryl interrupted sutures. The skin was closed using staples. A total of 30 cc of 0.5% Sensorcaine with epinephrine was injected in and around all incisions. Sterile occlusive dressings were applied to all wounds before the patient was awakened, extubated, and returned to the recovery room in satisfactory  condition after tolerating the procedure well.

## 2022-10-02 NOTE — Progress Notes (Signed)
  Progress Note   Patient: Eric Beltran. ZOX:[id number] DOB: 1940/08/13 DOA: 10/02/2022     0 DOS: the patient was seen and examined on 10/02/2022   Brief hospital course: Taken from H&P.  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  No fall.  Also associated with edema of left thigh.  ED course.  Vital stable.  Labs with WBC of 15.4, hemoglobin 9, INR 1.4. Left femur x-ray showed an acute oblique fracture of the proximal left femoral diaphysis with displacement and varus angulation. There is approximately a 5 cm override and marked varus angulation of the distal fracture fragment.   Orthopedic was consulted.  5/21: Vital stable.  Last dose of Eliquis was on 5/20 p.m. pending orthopedic consult and going to the OR later today.  Patient is currently under hospice care at home for metastatic sarcoma and not a candidate for continuation of treatment.     Assessment and Plan: * Pathological fracture, left femur, initial encounter for fracture (HCC) Admit to med/surg bed. Last dose of Eliquis was about 8 pm on 10-01-2022. Pt still on plavix. Npo. EDP has consulted orthopedics. Prn IV dilaudid for pain. IVF. 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 The cause of his pathologic fracture left femur.  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  Paroxysmal atrial fibrillation (HCC) Stable. Holding Eliquis for now. Continue with Cardizem  Coronary artery disease involving coronary bypass graft of native heart with angina pectoris (HCC) Hold plaivx. Dtr states pt taken off  statin by PCP 1-2 weeks ago.  Essential hypertension Stable. On cozaar and cardizem.   Subjective: Patient appears somnolent after taking pain medications.  Daughter at bedside and she was concern about his pain and delay in getting surgery as he is n.p.o.  Physical Exam: Vitals:   10/02/22 1000 10/02/22 1130 10/02/22 1200 10/02/22 1230  BP: 128/64 121/75 127/70 123/67  Pulse: 81 82 80 76  Resp: 17     Temp: 97.7 F (36.5 C)     TempSrc: Oral     SpO2: 96% 94% 97% 98%  Weight:      Height:       General.  For male elderly man, in no acute distress. Pulmonary.  Lungs clear bilaterally, normal respiratory effort. CV.  Regular rate and rhythm, no JVD, rub or murmur. Abdomen.  Soft, nontender, nondistended, BS positive. CNS.  Alert and oriented .  No focal neurologic deficit. Extremities.  Right BKA with prosthetic leg, mild edema involving left thigh.  Data Reviewed: Prior data reviewed  Family Communication: Discussed with daughter at bedside  Disposition: Status is: Inpatient Remains inpatient appropriate because: Severity of illness  Planned Discharge Destination:  To be determined  Time spent: 45 minutes  This record has been created using Conservation officer, historic buildings. Errors have been sought and corrected,but may not always be located. Such creation errors do not reflect on the standard of care.   Author: Arnetha Courser, MD 10/02/2022 1:35 PM  For on call review [email or web address].

## 2022-10-02 NOTE — Assessment & Plan Note (Addendum)
Stable. Holding Eliquis for now. Continue with Cardizem

## 2022-10-02 NOTE — Consult Note (Signed)
ORTHOPAEDIC CONSULTATION  REQUESTING PHYSICIAN: No att. providers found  Chief Complaint:   Left thigh pain  History of Present Illness: Eric Beltran. is a 82 y.o. male with multiple medical problems including coronary artery disease, chronic CHF, an ascending aortic aneurysm, hypertension, hyperlipidemia, peripheral artery disease, paroxysmal atrial fibrillation requiring Eliquis, and arthritis who is on hospice care for a widely metastatic sarcoma of unknown origin.  The patient is cared for by his family and is, at baseline, quite limited in his ability to get around.  He is able to stand to transfer with assistance.  Apparently, the patient was in his usual state of health when he sustained an injury to his left femur last evening without any obvious mechanism.  He was brought to the emergency room where x-rays demonstrated a displaced fracture of the proximal aspect of the left femur.  He has been admitted at this time for definitive management of this injury.  The patient denies any associated injuries.  He did not strike his head or lose consciousness.  The patient also denies any lightheadedness, dizziness, chest pain, shortness of breath, or other symptoms which may have precipitated this injury.  Past Medical History:  Diagnosis Date   Arthritis    Ascending aortic aneurysm (HCC)    Status post repair 12/2011 at [facility]   BPH (benign prostatic hyperplasia)    Chronic diastolic CHF (congestive heart failure) (HCC)    Coronary atherosclerosis of native coronary artery    a. Multivessel status post prior stenting and ultimately CABG 12/2011 at [facility] with with LIMA to LAD, SVG to PLB, SVG to ramus, SVG to OM . b. Last cath 06/2014 (following ischemic nuc) -> medical therapy, no feasible way to revascularize LCx territory.   Essential hypertension    Mixed hyperlipidemia    Myocardial infarction [facility])    PAD (peripheral  artery disease) (HCC)    PAF (paroxysmal atrial fibrillation) (HCC)    Supraventricular tachycardia    Past Surgical History:  Procedure Laterality Date   ASCENDING AORTIC ANEURYSM REPAIR  12/2011 [facility]   26 mm.Dacron graft   CORONARY ARTERY BYPASS GRAFT  12/2011 [facility]   LIMA to LAD, SVG to PLB, SVG to ramus, SVG to OM   CORONARY ARTERY BYPASS GRAFT N/A 08/06/2017   Procedure: REDO CORONARY ARTERY BYPASS GRAFTING (CABG) TIMES FOUR UTILIZING LEFT GREATER SAPHENOUS VEIN HAVESTED ENDOVASCULARLY, LEFT RADIAL ARTERY.  RIGHT AXILLARY CANNULATION;  Surgeon: Alleen Borne, MD;  Location: [facility] OR;  Service: Open Heart Surgery;  Laterality: N/A;   CORONARY ATHERECTOMY N/A 02/10/2020   Procedure: CORONARY ATHERECTOMY;  Surgeon: Tonny Bollman, MD;  Location: [facility] INVASIVE CV LAB;  Service: Cardiovascular;  Laterality: N/A;   CORONARY BALLOON ANGIOPLASTY N/A 06/10/2020   Procedure: CORONARY BALLOON ANGIOPLASTY;  Surgeon: Iran Ouch, MD;  Location: [facility] INVASIVE CV LAB;  Service: Cardiovascular;  Laterality: N/A;   CORONARY STENT INTERVENTION N/A 02/10/2020   Procedure: CORONARY STENT INTERVENTION;  Surgeon: Tonny Bollman, MD;  Location: [facility] INVASIVE CV LAB;  Service: Cardiovascular;  Laterality: N/A;   CORONARY STENT INTERVENTION N/A 07/26/2021   Procedure: CORONARY STENT INTERVENTION;  Surgeon: Iran Ouch, MD;  Location: [facility] INVASIVE CV LAB;  Service: Cardiovascular;  Laterality: N/A;  RCA   CORONARY ULTRASOUND/IVUS N/A 07/26/2021   Procedure: Intravascular Ultrasound/IVUS;  Surgeon: Iran Ouch, MD;  Location: [facility] INVASIVE CV LAB;  Service: Cardiovascular;  Laterality: N/A;  OCT   HERNIA REPAIR     IR REMOVAL TUN CV  CATH W/O FL  09/19/2021   LEFT HEART CATH AND CORS/GRAFTS ANGIOGRAPHY N/A 07/11/2017   Procedure: LEFT HEART CATH AND CORS/GRAFTS ANGIOGRAPHY;  Surgeon: Lennette Bihari, MD;  Location: [facility] INVASIVE CV LAB;  Service: Cardiovascular;  Laterality: N/A;   LEFT HEART CATH AND CORS/GRAFTS  ANGIOGRAPHY N/A 09/12/2018   Procedure: LEFT HEART CATH AND CORS/GRAFTS ANGIOGRAPHY;  Surgeon: Lennette Bihari, MD;  Location: [facility] INVASIVE CV LAB;  Service: Cardiovascular;  Laterality: N/A;   LEFT HEART CATH AND CORS/GRAFTS ANGIOGRAPHY N/A 02/10/2020   Procedure: LEFT HEART CATH AND CORS/GRAFTS ANGIOGRAPHY;  Surgeon: Tonny Bollman, MD;  Location: [facility] INVASIVE CV LAB;  Service: Cardiovascular;  Laterality: N/A;   LEFT HEART CATH AND CORS/GRAFTS ANGIOGRAPHY N/A 06/10/2020   Procedure: LEFT HEART CATH AND CORS/GRAFTS ANGIOGRAPHY;  Surgeon: Iran Ouch, MD;  Location: [facility] INVASIVE CV LAB;  Service: Cardiovascular;  Laterality: N/A;   LEFT HEART CATH AND CORS/GRAFTS ANGIOGRAPHY N/A 07/26/2021   Procedure: LEFT HEART CATH AND CORS/GRAFTS ANGIOGRAPHY;  Surgeon: Iran Ouch, MD;  Location: [facility] INVASIVE CV LAB;  Service: Cardiovascular;  Laterality: N/A;   LEFT HEART CATHETERIZATION WITH CORONARY ANGIOGRAM N/A 06/18/2014   Procedure: LEFT HEART CATHETERIZATION WITH CORONARY ANGIOGRAM;  Surgeon: Micheline Chapman, MD;  Location: [facility] CATH LAB;  Service: Cardiovascular;  Laterality: N/A;   RADIAL ARTERY HARVEST Left 08/06/2017   Procedure: RADIAL ARTERY HARVEST;  Surgeon: Alleen Borne, MD;  Location: [facility] OR;  Service: Open Heart Surgery;  Laterality: Left;   TEE WITHOUT CARDIOVERSION N/A 08/06/2017   Procedure: TRANSESOPHAGEAL ECHOCARDIOGRAM (TEE);  Surgeon: Alleen Borne, MD;  Location: [facility] OR;  Service: Open Heart Surgery;  Laterality: N/A;   TOTAL HIP ARTHROPLASTY Right 02/07/2016   Procedure: RIGHT TOTAL HIP ARTHROPLASTY ANTERIOR APPROACH;  Surgeon: Kathryne Hitch, MD;  Location: [facility] OR;  Service: Orthopedics;  Laterality: Right;   TRANSURETHRAL RESECTION OF PROSTATE     Social History   Socioeconomic History   Marital status: Widowed    Spouse name: Not on file   Number of children: Not on file   Years of education: Not on file   Highest education level: Not on file  Occupational History    Not on file  Tobacco Use   Smoking status: Never    Passive exposure: Never   Smokeless tobacco: Never   Tobacco comments:    tobacco use - no  Vaping Use   Vaping Use: Never used  Substance and Sexual Activity   Alcohol use: Yes    Alcohol/week: 0.0 standard drinks of alcohol    Comment: Occasional   Drug use: No   Sexual activity: Not on file  Other Topics Concern   Not on file  Social History Narrative   Retired, widower, does not get regular exercise.          Social Determinants of Health   Financial Resource Strain: Not on file  Food Insecurity: Not on file  Transportation Needs: Not on file  Physical Activity: Not on file  Stress: Not on file  Social Connections: Not on file   Family History  Problem Relation Age of Onset   Hypertension Father    Parkinson's disease Father    Hypertension Mother    Lung cancer Sister    No Known Allergies Prior to Admission medications   Medication Sig Start Date End Date Taking? Authorizing Provider  amiodarone (PACERONE) 200 MG tablet Take 100 mg by mouth daily. 12/17/17  Yes [provider]  apixaban (ELIQUIS) 5 MG TABS tablet TAKE 1 TABLET BY MOUTH TWICE DAILY 08/30/22  Yes Jonelle Sidle, MD  Ascorbic Acid (VITAMIN C) 500 MG CAPS Take 1 tablet by mouth daily.   Yes [provider]  atorvastatin (LIPITOR) 80 MG tablet TAKE 1 TABLET BY MOUTH AT BEDTIME 08/22/22  Yes Jonelle Sidle, MD  clopidogrel (PLAVIX) 75 MG tablet TAKE 1 TABLET BY MOUTH DAILY WITH BREAKFAST 09/20/22  Yes Jonelle Sidle, MD  diltiazem (CARDIZEM SR) 120 MG 12 hr capsule Take 1 capsule (120 mg total) by mouth daily. 05/21/22  Yes Sharlene Dory, NP  FT SENNA-S 8.6-50 MG tablet Take 1 tablet by mouth at bedtime as needed. 09/11/22  Yes [provider]  furosemide (LASIX) 20 MG tablet TAKE 2 TABLETS BY MOUTH DAILY AS NEEDED FOR EDEMA OR FLUID 09/26/22  Yes Sharlene Dory, NP  gabapentin (NEURONTIN) 300 MG capsule Take 300 mg  by mouth daily. 09/05/22  Yes [provider]  glipiZIDE (GLUCOTROL) 5 MG tablet Take 1 tablet (5 mg total) by mouth daily before breakfast. 09/25/21 01/04/23 Yes Gherghe, Daylene Katayama, MD  isosorbide mononitrate (IMDUR) 60 MG 24 hr tablet TAKE 1 TABLET BY MOUTH TWICE DAILY every morning AND in THE evening 05/28/22  Yes Jonelle Sidle, MD  lisinopril (ZESTRIL) 10 MG tablet Take 10 mg by mouth daily.   Yes [provider]  losartan (COZAAR) 50 MG tablet Take 2 tablets (100 mg total) by mouth daily. 09/20/22  Yes Jonelle Sidle, MD  magnesium gluconate (MAGONATE) 500 MG tablet Take 500 mg by mouth 2 (two) times daily.   Yes [provider]  metFORMIN (GLUCOPHAGE) 500 MG tablet Take 1 tablet (500 mg total) by mouth 2 (two) times daily with a meal. 09/25/21 10/02/22 Yes Gherghe, Daylene Katayama, MD  Multiple Vitamin (MULTIVITAMIN) tablet Take 1 tablet by mouth at bedtime.   Yes [provider]  nitroGLYCERIN (NITROSTAT) 0.4 MG SL tablet DISSOLVE 1 TABLET UNDER THE TONGUE EVERY 5 MINUTES AS NEEDED FOR CHEST PAIN. DO NOT EXCEED A TOTAL OF 3 DOSES IN 15 MINUTES. 06/13/20  Yes Jonelle Sidle, MD  OLANZapine (ZYPREXA) 5 MG tablet Take 5 mg by mouth at bedtime.   Yes [provider]  Omega-3 Fatty Acids (FISH OIL) 1000 MG CAPS Take 2,000 mg by mouth daily.   Yes [provider]  oxyCODONE (OXY IR/ROXICODONE) 5 MG immediate release tablet Take 5 mg by mouth every 4 (four) hours as needed.   Yes [provider]  tamsulosin (FLOMAX) 0.4 MG CAPS capsule Take 0.4 mg by mouth at bedtime.   Yes [provider]  valACYclovir (VALTREX) 500 MG tablet Take 500 mg by mouth daily as needed (fever blisters). 01/21/20  Yes [provider]  zinc gluconate 50 MG tablet Take 50 mg by mouth at bedtime.   Yes [provider]  pantoprazole (PROTONIX) 40 MG tablet Take 40 mg by mouth daily. Patient not taking: Reported on 10/02/2022 08/31/22   [provider]  pazopanib (VOTRIENT) 200 MG tablet Take 400 mg by mouth daily. Patient not taking: Reported on 10/02/2022    [provider]  polyethylene glycol (MIRALAX / GLYCOLAX) 17 g packet Take 17 g by mouth daily. 08/31/22 10/30/22  [provider]   DG FEMUR MIN 2 VIEWS LEFT  Result Date: 10/02/2022 CLINICAL DATA:  Left thigh pain, tenderness EXAM: LEFT FEMUR 2 VIEWS COMPARISON:  None Available. FINDINGS: There is an acute, oblique,  overriding fracture of the proximal left femoral diaphysis with roughly 5 cm override and marked varus angulation of the distal fracture fragment. Left femoral head is still seated within the left acetabulum. Mild left hip degenerative arthritis is present. Distal left femur appears intact. Advanced vascular calcifications are noted. IMPRESSION: 1. Acute, oblique, overriding fracture of the proximal left femoral diaphysis. Electronically Signed   By: Helyn Numbers M.D.   On: 10/02/2022 00:03   DG Hip Unilat W or Wo Pelvis 2-3 Views Left  Result Date: 10/02/2022 CLINICAL DATA:  Left thigh pain EXAM: DG HIP (WITH OR WITHOUT PELVIS) 2-3V LEFT COMPARISON:  None Available. FINDINGS: An acute, oblique fracture of the proximal left femoral diaphysis is present with 5 cm override at, 1 shaft with lateral displacement, and marked varus angulation of the distal fracture fragment. The proximal left femur is still seated within the left acetabulum. Left hip joint space is preserved. Pelvis is intact. Right total hip arthroplasty has been performed. Advanced vascular calcifications are noted. IMPRESSION: 1. Acute, oblique fracture of the proximal left femoral diaphysis with displacement and varus angulation. Electronically Signed   By: Helyn Numbers M.D.   On: 10/02/2022 00:01    Positive ROS: All other systems have been reviewed and were otherwise negative with the exception of those mentioned in the HPI and as above.  Physical Exam: General:  Alert, no acute  distress Psychiatric:  Patient is competent for consent with normal mood and affect   Cardiovascular:  No pedal edema Respiratory:  No wheezing, non-labored breathing GI:  Abdomen is soft and non-tender Skin:  No lesions in the area of chief complaint Neurologic:  Sensation intact distally Lymphatic:  No axillary or cervical lymphadenopathy  Orthopedic Exam:  Orthopedic examination is limited to the left hip and lower extremity.  The left thigh region is notable for an obvious deformity as the leg is held in a shortened and externally rotated position.  The patient has moderate tenderness to palpation over the proximal portion of the left thigh.  He has more severe pain with any attempted active or passive motion of the hip or leg.  He is grossly neurovascularly intact to the left lower leg and foot.  X-rays:  X-rays of the pelvis and left femur are available for review and have been reviewed by myself.  The findings are as described above.  Assessment: Displaced proximal left femoral shaft fracture.  Plan: The treatment options, including both surgical and nonsurgical choices, have been discussed in detail with the patient and his family.  The patient and his family would like to proceed with surgical intervention to include intramedullary nailing of the displaced left femur fracture.  The risks (including bleeding, infection, nerve and/or blood vessel injury, persistent or recurrent pain, loosening or failure of the components, leg length inequality, dislocation, need for further surgery, blood clots, strokes, heart attacks or arrhythmias, pneumonia, etc.) and benefits of the surgical procedure were discussed.  The patient and his family state their understanding and agree to proceed.  He agrees to a blood transfusion if necessary.  A formal written consent will be obtained by the nursing staff.  Thank you for asking me to participate in the care of this most inspiring yet unfortunate man.  I  will be happy to follow him with you.   Maryagnes Amos, MD  Beeper #:  [phone]  10/02/2022 3:49 PM

## 2022-10-03 ENCOUNTER — Encounter: Payer: Self-pay | Admitting: Surgery

## 2022-10-03 DIAGNOSIS — C7800 Secondary malignant neoplasm of unspecified lung: Secondary | ICD-10-CM | POA: Diagnosis not present

## 2022-10-03 DIAGNOSIS — C7951 Secondary malignant neoplasm of bone: Secondary | ICD-10-CM

## 2022-10-03 DIAGNOSIS — M84452A Pathological fracture, left femur, initial encounter for fracture: Secondary | ICD-10-CM | POA: Diagnosis not present

## 2022-10-03 DIAGNOSIS — I480 Paroxysmal atrial fibrillation: Secondary | ICD-10-CM

## 2022-10-03 DIAGNOSIS — I10 Essential (primary) hypertension: Secondary | ICD-10-CM

## 2022-10-03 DIAGNOSIS — C499 Malignant neoplasm of connective and soft tissue, unspecified: Secondary | ICD-10-CM

## 2022-10-03 LAB — CBC
HCT: 27.3 % — ABNORMAL LOW (ref 39.0–52.0)
Hemoglobin: 8.5 g/dL — ABNORMAL LOW (ref 13.0–17.0)
MCH: 27.6 pg (ref 26.0–34.0)
MCHC: 31.1 g/dL (ref 30.0–36.0)
MCV: 88.6 fL (ref 80.0–100.0)
Platelets: 327 10*3/uL (ref 150–400)
RBC: 3.08 MIL/uL — ABNORMAL LOW (ref 4.22–5.81)
RDW: 18 % — ABNORMAL HIGH (ref 11.5–15.5)
WBC: 14 10*3/uL — ABNORMAL HIGH (ref 4.0–10.5)
nRBC: 0 % (ref 0.0–0.2)

## 2022-10-03 LAB — BASIC METABOLIC PANEL
Anion gap: 7 (ref 5–15)
BUN: 16 mg/dL (ref 8–23)
CO2: 25 mmol/L (ref 22–32)
Calcium: 8.7 mg/dL — ABNORMAL LOW (ref 8.9–10.3)
Chloride: 103 mmol/L (ref 98–111)
Creatinine, Ser: 0.62 mg/dL (ref 0.61–1.24)
GFR, Estimated: >60 mL/min (ref >60)
Glucose, Bld: 151 mg/dL — ABNORMAL HIGH (ref 70–99)
Potassium: 4.9 mmol/L (ref 3.5–5.1)
Sodium: 135 mmol/L (ref 135–145)

## 2022-10-03 MED ORDER — ORAL CARE MOUTH RINSE: 15.0000 mL | OROMUCOSAL | Status: DC | PRN

## 2022-10-03 NOTE — Evaluation (Signed)
Occupational Therapy Evaluation Patient Details Name: Eric Beltran. MRN: [id number] DOB: [birth year] Today's Date: 10/03/2022   History of Present Illness 82 y.o. male with multiple medical problems including coronary artery disease, chronic CHF, an ascending aortic aneurysm, hypertension, hyperlipidemia, peripheral artery disease, paroxysmal atrial fibrillation requiring Eliquis, and arthritis who is on hospice care for a widely metastatic sarcoma of unknown origin.  The patient is cared for by his family and is, at baseline, more limited recently regarding mobility.  Pt reports gradual onset of increasing L hip pain with non-traumatic episode of displaced fracture leading to hospitalization and subsequent hip pinning 5/21.   Clinical Impression   Eric Beltran was seen for OT evaluation this date. Prior to hospital admission, pt was recently requiring assist for ADLs and mobility from family. Pt lives with daughter. Pt requires MOD A don prosthetic leg and underwear in sitting. MOD A x2 sit<>stand with prosthetic leg donned, tolerates  ~5 feet mobility MIN A + RW. Pt would benefit from skilled OT to address noted impairments and functional limitations (see below for any additional details). Upon hospital discharge, recommend follow up therapy, if pt returns home will need assistance for all transfers.   Recommendations for follow up therapy are one component of a multi-disciplinary discharge planning process, led by the attending physician.  Recommendations may be updated based on patient status, additional functional criteria and insurance authorization.   Assistance Recommended at Discharge Frequent or constant Supervision/Assistance  Patient can return home with the following Two people to help with walking and/or transfers;Two people to help with bathing/dressing/bathroom;Help with stairs or ramp for entrance    Functional Status Assessment  Patient has had a recent decline in their functional  status and demonstrates the ability to make significant improvements in function in a reasonable and predictable amount of time.  Equipment Recommendations  BSC/3in1;Hospital bed    Recommendations for Other Services       Precautions / Restrictions Precautions Precautions: Fall Restrictions Weight Bearing Restrictions: Yes LLE Weight Bearing: Weight bearing as tolerated Other Position/Activity Restrictions: R AKA (family to bring prosthetic)      Mobility Bed Mobility               General bed mobility comments: in recliner on arrival    Transfers Overall transfer level: Needs assistance Equipment used: Rolling walker (2 wheels) Transfers: Sit to/from Stand Sit to Stand: Mod assist, +2 physical assistance           General transfer comment: tolerated ~5 feet mobility MIN A + RW      Balance Overall balance assessment: Needs assistance Sitting-balance support: No upper extremity supported, Feet supported Sitting balance-Leahy Scale: Good     Standing balance support: Bilateral upper extremity supported Standing balance-Leahy Scale: Poor                             ADL either performed or assessed with clinical judgement   ADL Overall ADL's : Needs assistance/impaired                                       General ADL Comments: MOD A x2 simulated BSC t/f. MOD A don prosthetic leg and underwear in sitting.      Pertinent Vitals/Pain Pain Assessment Pain Location: L hip     Hand Dominance     Extremity/Trunk  Assessment Upper Extremity Assessment Upper Extremity Assessment: Overall WFL for tasks assessed   Lower Extremity Assessment Lower Extremity Assessment: Generalized weakness       Communication Communication Communication: No difficulties   Cognition Arousal/Alertness: Awake/alert Behavior During Therapy: WFL for tasks assessed/performed Overall Cognitive Status: History of cognitive impairments - at  baseline                                 General Comments: daughter present t/o session and does correct pt on timeline      Home Living Family/patient expects to be discharged to:: Unsure Living Arrangements: Other relatives                               Additional Comments: prefer home with hospice if safe/moving well enough      Prior Functioning/Environment Prior Level of Function : Needs assist             Mobility Comments: per family report he was driving and relatively active ~2 months ago with decreased mobility and activity with pt needing more family assist.          OT Problem List: Decreased strength;Decreased range of motion;Decreased activity tolerance;Impaired balance (sitting and/or standing);Decreased safety awareness      OT Treatment/Interventions: Self-care/ADL training;Therapeutic exercise;Energy conservation;DME and/or AE instruction;Therapeutic activities;Patient/family education;Balance training    OT Goals(Current goals can be found in the care plan section) Acute Rehab OT Goals Patient Stated Goal: to go home OT Goal Formulation: With patient/family Time For Goal Achievement: 10/17/22 Potential to Achieve Goals: Good ADL Goals Pt Will Perform Grooming: with modified independence;standing Pt Will Perform Lower Body Dressing: sitting/lateral leans;with caregiver independent in assisting;with set-up;with supervision (don R prosthetic) Pt Will Transfer to Toilet: with modified independence;ambulating;bedside commode Pt Will Perform Toileting - Clothing Manipulation and hygiene: with modified independence;sitting/lateral leans  OT Frequency: Min 3X/week    Co-evaluation              AM-PAC OT "6 Clicks" Daily Activity     Outcome Measure Help from another person eating meals?: None Help from another person taking care of personal grooming?: A Little Help from another person toileting, which includes using toliet,  bedpan, or urinal?: A Lot Help from another person bathing (including washing, rinsing, drying)?: A Lot Help from another person to put on and taking off regular upper body clothing?: A Little Help from another person to put on and taking off regular lower body clothing?: A Lot 6 Click Score: 16   End of Session Nurse Communication: Mobility status  Activity Tolerance: Patient tolerated treatment well Patient left: in chair;with call bell/phone within reach;with family/visitor present  OT Visit Diagnosis: Other abnormalities of gait and mobility (R26.89);Muscle weakness (generalized) (M62.81)                Time: 1610-9604 OT Time Calculation (min): 27 min Charges:  OT General Charges $OT Visit: 1 Visit OT Evaluation $OT Eval Moderate Complexity: 1 Mod OT Treatments $Self Care/Home Management : 8-22 mins  Kathie Dike, M.S. OTR/L  10/03/22, 1:19 PM  ascom [phone]

## 2022-10-03 NOTE — Progress Notes (Signed)
  Progress Note   Patient: Eric Beltran. RUE:[id number] DOB: [birth year] DOA: 10/02/2022     1 DOS: the patient was seen and examined on 10/03/2022   Brief hospital course: Taken from H&P.  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  No fall.  Also associated with edema of left thigh.  ED course.  Vital stable.  Labs with WBC of 15.4, hemoglobin 9, INR 1.4. Left femur x-ray showed an acute oblique fracture of the proximal left femoral diaphysis with displacement and varus angulation. There is approximately a 5 cm override and marked varus angulation of the distal fracture fragment.   Orthopedic was consulted.  5/21: Vital stable.  Last dose of Eliquis was on 5/20 p.m. pending orthopedic consult and going to the OR later today.  Patient is currently under hospice care at home for metastatic sarcoma and not a candidate for continuation of treatment.  5/22: Vital stable.  Some improvement in leukocytosis and hemoglobin with slight decreased to 8.5.  Patient underwent ORIF yesterday, the procedure well.  PT is recommending SNF   Assessment and Plan: * Pathological fracture, left femur, initial encounter for fracture (HCC) S/p ORIF on 5/21, tolerated the procedure well. PT is recommending SNF. 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 The cause of his pathologic fracture left femur.  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  Paroxysmal atrial fibrillation (HCC) Stable. Holding Eliquis for now. Continue with Cardizem  Coronary artery disease involving coronary bypass graft of native heart  with angina pectoris (HCC) Hold plaivx. Dtr states pt taken off statin by PCP 1-2 weeks ago.  Essential hypertension Stable. On cozaar and cardizem.   Subjective: Patient was sitting comfortably in chair and eating breakfast when seen today.  Denies any significant pain.  Daughter at bedside.  Physical Exam: Vitals:   10/03/22 0048 10/03/22 0456 10/03/22 0753 10/03/22 1155  BP: 111/62 125/65 (!) 149/71 133/70  Pulse: 66 (!) 58 65 78  Resp: 16 18 18 17   Temp: 97.8 F (36.6 C) 97.7 F (36.5 C) 98.1 F (36.7 C) 97.9 F (36.6 C)  TempSrc:      SpO2: 99% 100% 98% 98%  Weight:  63.4 kg    Height:       General.  Frail elderly man, in no acute distress. Pulmonary.  Lungs clear bilaterally, normal respiratory effort. CV.  Regular rate and rhythm, no JVD, rub or murmur. Abdomen.  Soft, nontender, nondistended, BS positive. CNS.  Alert and oriented .  No focal neurologic deficit. Extremities.  Right BKA Psychiatry.  Judgment and insight appears normal.   Data Reviewed: Prior data reviewed  Family Communication: Discussed with daughter at bedside  Disposition: Status is: Inpatient Remains inpatient appropriate because: Severity of illness  Planned Discharge Destination:  To be determined  Time spent: 40 minutes  This record has been created using Conservation officer, historic buildings. Errors have been sought and corrected,but may not always be located. Such creation errors do not reflect on the standard of care.   Author: Arnetha Courser, MD 10/03/2022 2:02 PM  For on call review [email or web address].

## 2022-10-03 NOTE — Progress Notes (Signed)
Physical Therapy Treatment Patient Details Name: Taiwo Gagliano. MRN: [id number] DOB: Jun 10, 1940 Today's Date: 10/03/2022   History of Present Illness 82 y.o. male with multiple medical problems including coronary artery disease, chronic CHF, an ascending aortic aneurysm, hypertension, hyperlipidemia, peripheral artery disease, paroxysmal atrial fibrillation requiring Eliquis, and arthritis who is on hospice care for a widely metastatic sarcoma of unknown origin.  The patient is cared for by his family and is, at baseline, more limited recently regarding mobility.  Pt reports gradual onset of increasing L hip pain with non-traumatic episode of displaced fracture leading to hospitalization and subsequent hip pinning 5/21.    PT Comments    Pt continues to be very pleasant and motivated.  He was highly motivated to do a good bout of ambulation, he was expectedly slow and guarded but did manage to make it out of his room, however he had significant pain and fatigue and did not manage to do much of a turn to get back into the room and finally needed to utilize the chair follow and sit quickly.  On 4 separate sit to stand efforts (all from varying heights/surfaces) he needed significant assist to insure his initial upward effort landed right back on the seat.  Heavy assist to shift weight forward and up into walker, as well as 3/4 efforts requiring direct PT assist to extend R prosthetic knee to insure safe/appropriate WBing.  Pt highly engaged in exercises, education and all aspects of PT.  Family present t/o session and aware of his increased physical assist requirement.  Hoping to see steady improvement over the course of hospitalization/recovery and very much wishing to d/c home per his functional capacitysafety.  Continue with PT POC.  Recommendations for follow up therapy are one component of a multi-disciplinary discharge planning process, led by the attending physician.  Recommendations may be  updated based on patient status, additional functional criteria and insurance authorization.  Follow Up Recommendations  Can patient physically be transported by private vehicle: Yes    Assistance Recommended at Discharge Set up Supervision/Assistance  Patient can return home with the following Two people to help with walking and/or transfers;Two people to help with bathing/dressing/bathroom;Assistance with cooking/housework;Assist for transportation;Help with stairs or ramp for entrance   Equipment Recommendations  Hospital bed;Rolling walker (2 wheels);BSC/3in1 (if going home)    Recommendations for Other Services       Precautions / Restrictions Precautions Precautions: Fall Restrictions Weight Bearing Restrictions: Yes LLE Weight Bearing: Weight bearing as tolerated Other Position/Activity Restrictions: R AKA (family to bring prosthetic)     Mobility  Bed Mobility Overal bed mobility: Needs Assistance Bed Mobility: Supine to Sit     Supine to sit: Max assist     General bed mobility comments: Pt could not attempt transition from sitting to supine well at all, needed/requests (heavy) assist    Transfers Overall transfer level: Needs assistance Equipment used: Rolling walker (2 wheels) Transfers: Sit to/from Stand Sit to Stand: Mod assist, Max assist           General transfer comment: Multiple sit to stand efforts with pt able to initiate upward movement but ultimately needing heavy assist to actually get weight up and forward enough to effectively use R AKA/prostetic, hesitant to put full weight/trust in L LE.  Pt did better from elevated surfaces but definite need of considerable direct assist with getting to standing.    Ambulation/Gait Ambulation/Gait assistance: Min assist Gait Distance (Feet): 18 Feet Assistive device: Rolling walker (  2 wheels)         General Gait Details: Pt was eager to walk and highly motivated to do as much as he could but pain,  fatigue and eventual inability to shift weight effectively to either LE led to need for abortion of longer effort and need to sit back to recliner.  After some rest he was able to take some turning/side steps from recliner back to bed but when fatigued at ~18 ft was too weak to turn at that point...   Stairs             Wheelchair Mobility    Modified Rankin (Stroke Patients Only)       Balance Overall balance assessment: Needs assistance Sitting-balance support: No upper extremity supported, Feet supported Sitting balance-Leahy Scale: Good     Standing balance support: Bilateral upper extremity supported Standing balance-Leahy Scale: Poor Standing balance comment: static standing in walker once assisted to upright is functionally safe, however getting to initial set up required heavy assist (including direct assist with R prostetic at times)                            Cognition Arousal/Alertness: Awake/alert Behavior During Therapy: WFL for tasks assessed/performed Overall Cognitive Status: History of cognitive impairments - at baseline                                 General Comments: daughter present t/o session and does need to consistently correct him on the timeline of his increased need for assist/decreased mobility & independence        Exercises General Exercises - Lower Extremity Ankle Circles/Pumps: AROM, 10 reps, Left Quad Sets: AROM, 10 reps Heel Slides: Strengthening, 10 reps (with resisted leg ext) Hip ABduction/ADduction: AROM, Strengthening, 10 reps Hip Flexion/Marching: AROM, Strengthening, 10 reps    General Comments        Pertinent Vitals/Pain Pain Assessment Pain Assessment: Faces Pain Score: 10-Worst pain ever Faces Pain Scale: Hurts little more Pain Location: L hip - pt c/o very serious pain during ambulation with incongruent mild displays of discomfort during the activity    Home Living Family/patient expects  to be discharged to:: Private residence Living Arrangements: Other relatives Available Help at Discharge: Family;Available 24 hours/day   Home Access: Stairs to enter   [employer] of Steps: 3-4 steps, one set with b/l rails one with R only     Home Equipment: Rolling Walker (2 wheels) (lift chair) Additional Comments: prefer home with hospice if safe/moving well enough    Prior Function            PT Goals (current goals can now be found in the care plan section) Progress towards PT goals: Progressing toward goals    Frequency    BID      PT Plan      Co-evaluation              AM-PAC PT "6 Clicks" Mobility   Outcome Measure  Help needed turning from your back to your side while in a flat bed without using bedrails?: A Little Help needed moving from lying on your back to sitting on the side of a flat bed without using bedrails?: A Lot Help needed moving to and from a bed to a chair (including a wheelchair)?: A Lot Help needed standing up from a chair using your  arms (e.g., wheelchair or bedside chair)?: A Lot Help needed to walk in hospital room?: A Lot Help needed climbing 3-5 steps with a railing? : Total 6 Click Score: 12    End of Session Equipment Utilized During Treatment: Gait belt (issued one for home use) Activity Tolerance: Patient tolerated treatment well Patient left: in chair;with call bell/phone within reach;with family/visitor present Nurse Communication: Mobility status PT Visit Diagnosis: Muscle weakness (generalized) (M62.81);Difficulty in walking, not elsewhere classified (R26.2);Pain Pain - Right/Left: Left Pain - part of body: Hip     Time: 1359-1500 PT Time Calculation (min) (ACUTE ONLY): 61 min  Charges:  $Gait Training: 8-22 mins $Therapeutic Exercise: 8-22 mins $Therapeutic Activity: 23-37 mins                     Malachi Pro, DPT 10/03/2022, 4:12 PM

## 2022-10-03 NOTE — Assessment & Plan Note (Signed)
S/p ORIF on 5/21, tolerated the procedure well. PT is recommending SNF. -Continue with supportive care and pain management

## 2022-10-03 NOTE — Progress Notes (Signed)
Subjective: 1 Day Post-Op Procedure(s) (LRB): INTRAMEDULLARY (IM) NAIL INTERTROCHANTERIC (Left) Patient reports pain as mild.   Patient is well, and has had no acute complaints or problems PT and care management to assist with discharge planning, patient already on hospice due to metastatic sarcoma. Negative for chest pain and shortness of breath Fever: no Gastrointestinal:Negative for nausea and vomiting  Objective: Vital signs in last 24 hours: Temp:  [97.7 F (36.5 C)-99.1 F (37.3 C)] 97.7 F (36.5 C) (05/22 0456) Pulse Rate:  [58-98] 58 (05/22 0456) Resp:  [11-20] 18 (05/22 0456) BP: (105-168)/(58-76) 125/65 (05/22 0456) SpO2:  [91 %-100 %] 100 % (05/22 0456) Weight:  [63.4 kg-65.8 kg] 63.4 kg (05/22 0456)  Intake/Output from previous day:  Intake/Output Summary (Last 24 hours) at 10/03/2022 0739 Last data filed at 10/03/2022 0119 Gross per 24 hour  Intake 840 ml  Output 775 ml  Net 65 ml    Intake/Output this shift: No intake/output data recorded.  Labs: Recent Labs    10/02/22 0154 10/03/22 0426  HGB 9.0* 8.5*   Recent Labs    10/02/22 0154 10/03/22 0426  WBC 15.4* 14.0*  RBC 3.25* 3.08*  HCT 29.2* 27.3*  PLT 336 327   Recent Labs    10/02/22 0154 10/03/22 0426  NA 135 135  K 3.9 4.9  CL 102 103  CO2 24 25  BUN 15 16  CREATININE 0.73 0.62  GLUCOSE 144* 151*  CALCIUM 8.2* 8.7*   Recent Labs    10/02/22 0154  INR 1.4*     EXAM General - Patient is Alert, Appropriate, and Oriented Extremity - ABD soft Neurovascular intact Dorsiflexion/Plantar flexion intact Incision: mild drainage noted to the middle incision along the lateral thigh. No cellulitis present Compartment soft Dressing/Incision - Mild drainage to the middle incision site. Motor Function - intact, moving foot and toes well on exam.  Abdomen soft with intact bowel sounds this morning.  Past Medical History:  Diagnosis Date   Arthritis    Ascending aortic aneurysm (HCC)     Status post repair 12/2011 at [facility]   BPH (benign prostatic hyperplasia)    Chronic diastolic CHF (congestive heart failure) (HCC)    Coronary atherosclerosis of native coronary artery    a. Multivessel status post prior stenting and ultimately CABG 12/2011 at [facility] with with LIMA to LAD, SVG to PLB, SVG to ramus, SVG to OM . b. Last cath 06/2014 (following ischemic nuc) -> medical therapy, no feasible way to revascularize LCx territory.   Essential hypertension    Mixed hyperlipidemia    Myocardial infarction (HCC)    PAD (peripheral artery disease) (HCC)    PAF (paroxysmal atrial fibrillation) (HCC)    Supraventricular tachycardia     Assessment/Plan: 1 Day Post-Op Procedure(s) (LRB): INTRAMEDULLARY (IM) NAIL INTERTROCHANTERIC (Left) Principal Problem:   Pathological fracture, left femur, initial encounter for fracture [facility]) Active Problems:   Essential hypertension   Coronary artery disease involving coronary bypass graft of native heart with angina pectoris (HCC)   Paroxysmal atrial fibrillation (HCC)   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   Weight as of this encounter: 63.4 kg. Advance diet Up with therapy D/C IV fluids when tolerating po intake.  Labs and vitals reviewed this morning.  WBC down to 14.0, Hg 8.5.  Continue to monitor. Up  with therapy today, can WBAT to the left leg. Patient reports he is passing gas this AM, continue to work on BM.  DVT Prophylaxis -  Eliquis Weight-Bearing as tolerated to left leg, s/p right lower extremity amputation.  Valeria Batman, PA-C [facility] Orthopaedic Surgery 10/03/2022, 7:39 AM

## 2022-10-03 NOTE — Evaluation (Signed)
Physical Therapy Evaluation Patient Details Name: Eric Beltran. MRN: [id number] DOB: 08-04-1940 Today's Date: 10/03/2022  History of Present Illness  82 y.o. male with multiple medical problems including coronary artery disease, chronic CHF, an ascending aortic aneurysm, hypertension, hyperlipidemia, peripheral artery disease, paroxysmal atrial fibrillation requiring Eliquis, and arthritis who is on hospice care for a widely metastatic sarcoma of unknown origin.  The patient is cared for by his family and is, at baseline, more limited recently regarding mobility.  Pt reports gradual onset of increasing L hip pain with non-traumatic episode of displaced fracture leading to hospitalization and subsequent hip pinning 5/21.  Clinical Impression  Pt pleasant and eager to work with PT.  He does not have prosthetic with him at time of eval (family to bring for this afternoon) so deferred mobility but pt able to participate with LE exercises.  Pt does not have a lot of pain, but did endorse increased L hip pain with increasing exercises.  Pt overall showed great engagement with exercises, completion of mobility exam slated for this afternoon.  Pt, however, is clearly not at his baseline and will need continued PT to address functional imitations post-op.       Recommendations for follow up therapy are one component of a multi-disciplinary discharge planning process, led by the attending physician.  Recommendations may be updated based on patient status, additional functional criteria and insurance authorization.  Follow Up Recommendations Can patient physically be transported by private vehicle: Yes     Assistance Recommended at Discharge Set up Supervision/Assistance  Patient can return home with the following  Two people to help with walking and/or transfers;Two people to help with bathing/dressing/bathroom;Assistance with cooking/housework;Assist for transportation;Help with stairs or ramp for  entrance    Equipment Recommendations  (TBD at rehab)  Recommendations for Other Services       Functional Status Assessment Patient has had a recent decline in their functional status and demonstrates the ability to make significant improvements in function in a reasonable and predictable amount of time.     Precautions / Restrictions Precautions Precautions: Fall Restrictions Weight Bearing Restrictions: Yes LLE Weight Bearing: Weight bearing as tolerated Other Position/Activity Restrictions: R AKA (family to bring prosthetic)      Mobility  Bed Mobility               General bed mobility comments: in recliner on arrival, does not have prostetic this AM, family to bring for PM session    Transfers                        Ambulation/Gait                  Stairs            Wheelchair Mobility    Modified Rankin (Stroke Patients Only)       Balance                                             Pertinent Vitals/Pain Pain Assessment Pain Assessment: 0-10 Pain Score: 4  Pain Location: L hip    Home Living Family/patient expects to be discharged to:: Unsure Living Arrangements: Other relatives                 Additional Comments: prefer home with hospice if safe/moving well enough  Prior Function Prior Level of Function : Needs assist             Mobility Comments: per family report he was driving and relatively active ~2 months ago with decreased mobility and activity with pt needing more family assist.       Hand Dominance        Extremity/Trunk Assessment   Upper Extremity Assessment Upper Extremity Assessment: Generalized weakness;Overall WFL for tasks assessed    Lower Extremity Assessment Lower Extremity Assessment: Generalized weakness (R AKA with functional hip AROM, L hip grossly 3+/5, lacks TKE with active quad set)       Communication   Communication: No difficulties   Cognition Arousal/Alertness: Awake/alert Behavior During Therapy: WFL for tasks assessed/performed Overall Cognitive Status: History of cognitive impairments - at baseline                                 General Comments: daughter present t/o session and does need to consistently correct him on the timeline of his increased need for assist/decreased mobility & independence        General Comments      Exercises General Exercises - Lower Extremity Ankle Circles/Pumps: AROM, 10 reps, Left Quad Sets: AROM, 10 reps (lacks TKE) Hip ABduction/ADduction: AROM, Strengthening, 10 reps Hip Flexion/Marching: AROM, Strengthening, 10 reps   Assessment/Plan    PT Assessment Patient needs continued PT services  PT Problem List Decreased strength;Decreased range of motion;Decreased activity tolerance;Decreased balance;Decreased mobility;Pain;Decreased safety awareness;Decreased cognition;Decreased knowledge of use of DME;Decreased knowledge of precautions       PT Treatment Interventions DME instruction;Gait training;Functional mobility training;Therapeutic activities;Balance training;Therapeutic exercise;Neuromuscular re-education;Patient/family education    PT Goals (Current goals can be found in the Care Plan section)  Acute Rehab PT Goals Patient Stated Goal: Go home with family if possible PT Goal Formulation: With patient Time For Goal Achievement: 10/16/22 Potential to Achieve Goals: Good    Frequency BID     Co-evaluation               AM-PAC PT "6 Clicks" Mobility  Outcome Measure Help needed turning from your back to your side while in a flat bed without using bedrails?: A Little Help needed moving from lying on your back to sitting on the side of a flat bed without using bedrails?: A Lot Help needed moving to and from a bed to a chair (including a wheelchair)?: A Lot Help needed standing up from a chair using your arms (e.g., wheelchair or bedside  chair)?: A Lot Help needed to walk in hospital room?: Total Help needed climbing 3-5 steps with a railing? : Total 6 Click Score: 11    End of Session Equipment Utilized During Treatment: Gait belt Activity Tolerance: Patient tolerated treatment well Patient left: in chair;with call bell/phone within reach;with family/visitor present Nurse Communication: Mobility status PT Visit Diagnosis: Muscle weakness (generalized) (M62.81);Difficulty in walking, not elsewhere classified (R26.2);Pain Pain - Right/Left: Left Pain - part of body: Hip    Time: 4098-1191 PT Time Calculation (min) (ACUTE ONLY): 32 min   Charges:   PT Evaluation $PT Eval Low Complexity: 1 Low PT Treatments $Therapeutic Exercise: 8-22 mins        Malachi Pro, DPT 10/03/2022, 11:39 AM

## 2022-10-03 NOTE — TOC Progression Note (Signed)
Transition of Care (TOC) - Progression Note    Patient Details  Name: Eric Beltran. MRN: [id number] Date of Birth: 1940-11-22  Transition of Care [facility]) CM/SW Contact  Marlowe Sax, RN Phone Number: 10/03/2022, 12:16 PM  Clinical Narrative:   Spoke with Sarah the patient's daughter He started Hospice at home about 3 weeks ago, he has seen hospice nurse a few times His daughter brought his prothesis from home and he will work with PT again Their goal is to go home with His daughter and continue having Hospice, I explained if they change their mind and decide on STR then they would have to stop Hospice until he completed STR, she stated understanding She stated that she is familiar with helping him at home.  We will touch base again once he works with PT having the prothesis in place     Expected Discharge Plan: Home w Hospice Care Barriers to Discharge: Continued Medical Work up  Expected Discharge Plan and Services   Discharge Planning Services: CM Consult   Living arrangements for the past 2 months: Single Family Home                 DME Arranged: N/A DME Agency: NA       HH Arranged: NA HH Agency: NA         Social Determinants of Health (SDOH) Interventions SDOH Screenings   Food Insecurity: No Food Insecurity (10/02/2022)  Housing: Low Risk  (10/02/2022)  Transportation Needs: No Transportation Needs (10/02/2022)  Utilities: Not At Risk (10/02/2022)  Tobacco Use: Low Risk  (10/03/2022)    Readmission Risk Interventions    09/21/2021    3:19 PM  Readmission Risk Prevention Plan  Transportation Screening Complete  PCP or Specialist Appt within 3-5 Days Complete  HRI or Home Care Consult Complete  Social Work Consult for Recovery Care Planning/Counseling Complete  Palliative Care Screening Not Applicable  Medication Review Oceanographer) Complete

## 2022-10-04 DIAGNOSIS — M84452A Pathological fracture, left femur, initial encounter for fracture: Secondary | ICD-10-CM | POA: Diagnosis not present

## 2022-10-04 DIAGNOSIS — C7800 Secondary malignant neoplasm of unspecified lung: Secondary | ICD-10-CM | POA: Diagnosis not present

## 2022-10-04 DIAGNOSIS — C7951 Secondary malignant neoplasm of bone: Secondary | ICD-10-CM

## 2022-10-04 DIAGNOSIS — C499 Malignant neoplasm of connective and soft tissue, unspecified: Secondary | ICD-10-CM

## 2022-10-04 DIAGNOSIS — I480 Paroxysmal atrial fibrillation: Secondary | ICD-10-CM

## 2022-10-04 DIAGNOSIS — I10 Essential (primary) hypertension: Secondary | ICD-10-CM

## 2022-10-04 LAB — SURGICAL PATHOLOGY

## 2022-10-04 MED ORDER — OXYCODONE HCL 5 MG PO TABS: 5.0000 mg | ORAL_TABLET | ORAL | 0 refills | Status: DC | PRN

## 2022-10-04 NOTE — Progress Notes (Signed)
Occupational Therapy Treatment Patient Details Name: Eric Beltran. MRN: [id number] DOB: Sep 13, 1940 Today's Date: 10/04/2022   History of present illness 82 y.o. male with multiple medical problems including coronary artery disease, chronic CHF, an ascending aortic aneurysm, hypertension, hyperlipidemia, peripheral artery disease, paroxysmal atrial fibrillation requiring Eliquis, and arthritis who is on hospice care for a widely metastatic sarcoma of unknown origin.  The patient is cared for by his family and is, at baseline, more limited recently regarding mobility.  Pt reports gradual onset of increasing L hip pain with non-traumatic episode of displaced fracture leading to hospitalization and subsequent hip pinning 5/21.   OT comments  Pt seen for PT/OT treatment on this date. Upon arrival to room pt was supine in bed, agreeable to tx. Pt requires MOD A +2 for bed mobility to support trunk and LE management. MOD A +2 sit<>stand. Pt displayed good effort, but was limited by pain, 10/10 while standing, 4/10 while seated. RN provided pain medication during session. Pt re-educated on HEP and performed exercises while seated in recliner. Pt making progress toward goals, will continue to follow POC. Discharge recommendation remains appropriate.     Recommendations for follow up therapy are one component of a multi-disciplinary discharge planning process, led by the attending physician.  Recommendations may be updated based on patient status, additional functional criteria and insurance authorization.    Assistance Recommended at Discharge Frequent or constant Supervision/Assistance  Patient can return home with the following  Two people to help with walking and/or transfers;Two people to help with bathing/dressing/bathroom;Help with stairs or ramp for entrance   Equipment Recommendations  BSC/3in1;Hospital bed    Recommendations for Other Services      Precautions / Restrictions  Precautions Precautions: Fall Restrictions Weight Bearing Restrictions: Yes LLE Weight Bearing: Weight bearing as tolerated Other Position/Activity Restrictions: R AKA       Mobility Bed Mobility Overal bed mobility: Needs Assistance Bed Mobility: Supine to Sit     Supine to sit: Mod assist, +2 for physical assistance (trunk and BLE management)          Transfers Overall transfer level: Needs assistance Equipment used: Rolling walker (2 wheels) Transfers: Bed to chair/wheelchair/BSC Sit to Stand: Mod assist, +2 physical assistance Stand pivot transfers: Min assist, +2 physical assistance         General transfer comment: Pt reporst that he was limited due to p!     Balance Overall balance assessment: Needs assistance Sitting-balance support: No upper extremity supported, Feet supported Sitting balance-Leahy Scale: Good     Standing balance support: Bilateral upper extremity supported Standing balance-Leahy Scale: Poor                             ADL either performed or assessed with clinical judgement   ADL Overall ADL's : Needs assistance/impaired                     Lower Body Dressing: Sitting/lateral leans;Min guard (Pt able to doff prostesis while seated in recliner)                 General ADL Comments: MOD A x2 simulated BSC t/f.    Extremity/Trunk Assessment Upper Extremity Assessment Upper Extremity Assessment: Overall WFL for tasks assessed   Lower Extremity Assessment Lower Extremity Assessment: Generalized weakness        Vision       Perception     Praxis  Cognition Arousal/Alertness: Awake/alert Behavior During Therapy: WFL for tasks assessed/performed Overall Cognitive Status: History of cognitive impairments - at baseline                                          Exercises Exercises: General Lower Extremity (Reviewed HEP, pt performed and return demonstrated exercises listed  below) General Exercises - Lower Extremity Ankle Circles/Pumps: Strengthening, Both, 5 reps, Seated Quad Sets: Strengthening, Both, 5 reps, Seated Gluteal Sets: Strengthening, Both, 5 reps, Seated Hip ABduction/ADduction: Strengthening, Both, 5 reps, Seated    Shoulder Instructions       General Comments      Pertinent Vitals/ Pain       Pain Assessment Pain Assessment: 0-10 Pain Score: 10-Worst pain ever Pain Location: L hip 10/10 while standing, 4/10 while seated in recliner Pain Descriptors / Indicators: Tender, Sharp Pain Intervention(s): Monitored during session, Repositioned, RN gave pain meds during session, Relaxation, Ice applied  Home Living                                          Prior Functioning/Environment              Frequency  Min 3X/week        Progress Toward Goals  OT Goals(current goals can now be found in the care plan section)  Progress towards OT goals: Progressing toward goals  Acute Rehab OT Goals Patient Stated Goal: to go home OT Goal Formulation: With patient Time For Goal Achievement: 10/17/22 Potential to Achieve Goals: Good ADL Goals Pt Will Perform Grooming: with modified independence;standing Pt Will Perform Lower Body Dressing: sitting/lateral leans;with caregiver independent in assisting;with set-up;with supervision Pt Will Transfer to Toilet: with modified independence;ambulating;bedside commode Pt Will Perform Toileting - Clothing Manipulation and hygiene: with modified independence;sitting/lateral leans  Plan Discharge plan remains appropriate    Co-evaluation    PT/OT/SLP Co-Evaluation/Treatment: Yes Reason for Co-Treatment: To address functional/ADL transfers   OT goals addressed during session: ADL's and self-care      AM-PAC OT "6 Clicks" Daily Activity     Outcome Measure   Help from another person eating meals?: None Help from another person taking care of personal grooming?: A  Little Help from another person toileting, which includes using toliet, bedpan, or urinal?: A Lot Help from another person bathing (including washing, rinsing, drying)?: A Lot Help from another person to put on and taking off regular upper body clothing?: A Little Help from another person to put on and taking off regular lower body clothing?: A Lot 6 Click Score: 16    End of Session Equipment Utilized During Treatment: Rolling walker (2 wheels)  OT Visit Diagnosis: Other abnormalities of gait and mobility (R26.89);Muscle weakness (generalized) (M62.81)   Activity Tolerance Patient limited by pain   Patient Left in chair;with call bell/phone within reach;with bed alarm set;with chair alarm set;with nursing/sitter in room   Nurse Communication          Time: 1610-9604 OT Time Calculation (min): 27 min  Charges: OT General Charges $OT Visit: 1 Visit OT Treatments $Self Care/Home Management : 8-22 mins  Thresa Ross, OTS

## 2022-10-04 NOTE — Progress Notes (Signed)
Subjective: 2 Days Post-Op Procedure(s) (LRB): INTRAMEDULLARY (IM) NAIL INTERTROCHANTERIC (Left) Patient reports pain as mild in the left leg this morning. Patient is well, and has had no acute complaints or problems PT and care management to assist with discharge planning, patient already on hospice due to metastatic sarcoma.  Current plan is for discharge to SNF. Negative for chest pain and shortness of breath Fever: no Gastrointestinal:Negative for nausea and vomiting  Objective: Vital signs in last 24 hours: Temp:  [97.9 F (36.6 C)-98.1 F (36.7 C)] 98.1 F (36.7 C) (05/22 2337) Pulse Rate:  [65-80] 79 (05/22 2337) Resp:  [17-20] 20 (05/22 2337) BP: (106-149)/(56-72) 106/56 (05/22 2337) SpO2:  [94 %-98 %] 94 % (05/22 2337) Weight:  [64 kg] 64 kg (05/23 0442)  Intake/Output from previous day:  Intake/Output Summary (Last 24 hours) at 10/04/2022 0742 Last data filed at 10/04/2022 0336 Gross per 24 hour  Intake 240 ml  Output 2900 ml  Net -2660 ml    Intake/Output this shift: No intake/output data recorded.  Labs: Recent Labs    10/02/22 0154 10/03/22 0426  HGB 9.0* 8.5*   Recent Labs    10/02/22 0154 10/03/22 0426  WBC 15.4* 14.0*  RBC 3.25* 3.08*  HCT 29.2* 27.3*  PLT 336 327   Recent Labs    10/02/22 0154 10/03/22 0426  NA 135 135  K 3.9 4.9  CL 102 103  CO2 24 25  BUN 15 16  CREATININE 0.73 0.62  GLUCOSE 144* 151*  CALCIUM 8.2* 8.7*   Recent Labs    10/02/22 0154  INR 1.4*     EXAM General - Patient is Alert, Appropriate, and Oriented Extremity - ABD soft Neurovascular intact Dorsiflexion/Plantar flexion intact Incision: mild drainage noted to the middle incision along the lateral thigh. No cellulitis present Compartment soft Dressing/Incision - Mild drainage to the middle incision site. Motor Function - intact, moving foot and toes well on exam.  Abdomen soft with intact bowel sounds this morning.  Past Medical History:   Diagnosis Date   Arthritis    Ascending aortic aneurysm (HCC)    Status post repair 12/2011 at [facility]   BPH (benign prostatic hyperplasia)    Chronic diastolic CHF (congestive heart failure) (HCC)    Coronary atherosclerosis of native coronary artery    a. Multivessel status post prior stenting and ultimately CABG 12/2011 at [facility] with with LIMA to LAD, SVG to PLB, SVG to ramus, SVG to OM . b. Last cath 06/2014 (following ischemic nuc) -> medical therapy, no feasible way to revascularize LCx territory.   Essential hypertension    Mixed hyperlipidemia    Myocardial infarction (HCC)    PAD (peripheral artery disease) (HCC)    PAF (paroxysmal atrial fibrillation) (HCC)    Supraventricular tachycardia     Assessment/Plan: 2 Days Post-Op Procedure(s) (LRB): INTRAMEDULLARY (IM) NAIL INTERTROCHANTERIC (Left) Principal Problem:   Pathological fracture, left femur, initial encounter for fracture [facility]) Active Problems:   Essential hypertension   Coronary artery disease involving coronary bypass graft of native heart with angina pectoris (HCC)   Paroxysmal atrial fibrillation (HCC)   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   Weight as of this encounter: 64 kg. Advance diet Up with therapy D/C IV fluids when tolerating po intake.  Vitals reviewed this AM. Up with therapy  today, can WBAT to the left leg. Patient reports he is passing gas this AM, continue to work on BM.  After discharge from hospital, follow-up with [facility] in 10-14 days for staple removal and x-rays of the left femur. Continue on Eliquis and Plavix following surgery for DVT prophylaxis.  DVT Prophylaxis -  Eliquis Weight-Bearing as tolerated to left leg, s/p right lower extremity amputation.  Valeria Batman, PA-C [facility] 10/04/2022, 7:42 AM

## 2022-10-04 NOTE — Progress Notes (Signed)
Progress Note   Patient: Eric Beltran. ZOX:[id number] DOB: Nov 17, 1940 DOA: 10/02/2022     2 DOS: the patient was seen and examined on 10/04/2022   Brief hospital course: Taken from H&P.  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  No fall.  Also associated with edema of left thigh.  ED course.  Vital stable.  Labs with WBC of 15.4, hemoglobin 9, INR 1.4. Left femur x-ray showed an acute oblique fracture of the proximal left femoral diaphysis with displacement and varus angulation. There is approximately a 5 cm override and marked varus angulation of the distal fracture fragment.   Orthopedic was consulted.  5/21: Vital stable.  Last dose of Eliquis was on 5/20 p.m. pending orthopedic consult and going to the OR later today.  Patient is currently under hospice care at home for metastatic sarcoma and not a candidate for continuation of treatment.  5/22: Vital stable.  Some improvement in leukocytosis and hemoglobin with slight decreased to 8.5.  Patient underwent ORIF yesterday, the procedure well.  PT is recommending SNF  5/23: Remained stable.  Wants to go home.  Discussed with daughter and they might hold his hospice privileges in order to get home health services with PT and OT.  They will let us know about their decision.  Daughter requesting to keep for another day so he can work little more with PT while in the hospital.  Having some oozing from one of the wound as he is on Eliquis and Plavix.   Assessment and Plan: * Pathological fracture, left femur, initial encounter for fracture (HCC) S/p ORIF on 5/21, tolerated the procedure well. PT is recommending SNF, but patient wants to go home. 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 The cause of his pathologic fracture left femur.  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  Paroxysmal atrial fibrillation (HCC) Stable. Holding Eliquis for now. Continue with Cardizem  Coronary artery disease involving coronary bypass graft of native heart with angina pectoris (HCC) Hold plaivx. Dtr states pt taken off statin by PCP 1-2 weeks ago.  Essential hypertension Stable. On cozaar and cardizem.   Subjective: Patient was seen and examined today.  Denies any pain while sitting in chair, does experience some increase in pain with ambulation.  Wants to go home instead of rehab.  Physical Exam: Vitals:   10/03/22 1610 10/03/22 2337 10/04/22 0442 10/04/22 0806  BP: (!) 149/72 (!) 106/56  (!) 111/54  Pulse: 80 79  70  Resp: 18 20  18   Temp: 98.1 F (36.7 C) 98.1 F (36.7 C)  98 F (36.7 C)  TempSrc:      SpO2: 94% 94%  96%  Weight:   64 kg   Height:       General.  Frail elderly man, in no acute distress. Pulmonary.  Lungs clear bilaterally, normal respiratory effort. CV.  Regular rate and rhythm, no JVD, rub or murmur. Abdomen.  Soft, nontender, nondistended, BS positive. CNS.  Alert and oriented .  No focal neurologic deficit. Extremities.  Right AKA, one of the left-sided dressing with some bleeding. Psychiatry.  Judgment and insight appears normal.  Data Reviewed: Prior data reviewed  Family Communication: Discussed with daughter on phone  Disposition: Status is: Inpatient Remains inpatient appropriate because: Severity of illness  Planned Discharge Destination:  To be determined  Time spent: 40 minutes  This record has been created using Conservation officer, historic buildings. Errors have been sought and corrected,but may not always be located. Such creation errors do not reflect on the standard of care.    Author: Arnetha Courser, MD 10/04/2022 1:39 PM  For on call review [email or web address].

## 2022-10-04 NOTE — TOC Progression Note (Signed)
Transition of Care (TOC) - Progression Note    Patient Details  Name: Eric Beltran. MRN: [id number] Date of Birth: 1940-07-07  Transition of Care [facility]) CM/SW Contact  Marlowe Sax, RN Phone Number: 10/04/2022, 9:54 AM  Clinical Narrative:    Spoke with the patient's daughter Maralyn Sago, she stated that she plans to take him home as long as he is still able to do as well as yesterday We talked about hospice and not able to have hospice with Home health PT and OT She stated understanding, She tells me that she is expecting him to be in the hospital a couple more days, I explained that he is stable to discharge, she requested a call from the Doctor, I notified the Doctor of the request  Expected Discharge Plan: Home w Hospice Care Barriers to Discharge: Continued Medical Work up  Expected Discharge Plan and Services   Discharge Planning Services: CM Consult   Living arrangements for the past 2 months: Single Family Home                 DME Arranged: N/A DME Agency: NA       HH Arranged: NA HH Agency: NA         Social Determinants of Health (SDOH) Interventions SDOH Screenings   Food Insecurity: No Food Insecurity (10/02/2022)  Housing: Low Risk  (10/02/2022)  Transportation Needs: No Transportation Needs (10/02/2022)  Utilities: Not At Risk (10/02/2022)  Tobacco Use: Low Risk  (10/03/2022)    Readmission Risk Interventions    09/21/2021    3:19 PM  Readmission Risk Prevention Plan  Transportation Screening Complete  PCP or Specialist Appt within 3-5 Days Complete  HRI or Home Care Consult Complete  Social Work Consult for Recovery Care Planning/Counseling Complete  Palliative Care Screening Not Applicable  Medication Review [employer]) Complete

## 2022-10-04 NOTE — Plan of Care (Signed)

## 2022-10-04 NOTE — Plan of Care (Signed)
  Problem: Clinical Measurements: Goal: Will remain free from infection Outcome: Progressing   Problem: Activity: Goal: Risk for activity intolerance will decrease Outcome: Progressing   Problem: Elimination: Goal: Will not experience complications related to urinary retention Outcome: Progressing   Problem: Pain Managment: Goal: General experience of comfort will improve Outcome: Progressing   Problem: Safety: Goal: Ability to remain free from injury will improve Outcome: Progressing   Problem: Skin Integrity: Goal: Risk for impaired skin integrity will decrease Outcome: Progressing

## 2022-10-04 NOTE — Telephone Encounter (Signed)
Spoke with patient who is currently admitted to [facility] and advised that his medications would be reviewed during admission and at discharge and would decided at that time if his diltiazem dose needed to be adjusted. Verbalized understanding.

## 2022-10-04 NOTE — Progress Notes (Signed)
Physical Therapy Treatment Patient Details Name: Eric Beltran. MRN: [id number] DOB: [birth year] Today's Date: 10/04/2022   History of Present Illness 82 y.o. male with multiple medical problems including coronary artery disease, chronic CHF, an ascending aortic aneurysm, hypertension, hyperlipidemia, peripheral artery disease, paroxysmal atrial fibrillation requiring Eliquis, and arthritis who is on hospice care for a widely metastatic sarcoma of unknown origin.  The patient is cared for by his family and is, at baseline, more limited recently regarding mobility.  Pt reports gradual onset of increasing L hip pain with non-traumatic episode of displaced fracture leading to hospitalization and subsequent hip pinning 5/21.    PT Comments    PM/BID PT session broken into 2 separate sessions due to pt needing to get pain meds to fully participate. He was in recliner upon arrival and has been in chair since this mornings session. Pain remains elevated but pt agree to participate. He requires extensive assistance to safely stand from low recliner surface. Highly recommend +2 assistance for all future transfers until pt's strength/pain improves. Required author to physically extend R prosthetic knee to achieve full upright standing. Pt struggles to ambulate ~ 5 ft even with mod-max assist with lateral wt shift for opposite LE advancement. If pt elects to DC directly home, author recommends use of WC versus attempting ambulation. He would benefit from slideboard transfers to/from drop arm BSC and in/out of w/c.   Pt is extremely high fall risk with LLE fx and RLE AKA. Did discuss with family need for increased prosthetic socks for prosthesis to properly fit and safety concerns with current transfers/gait abilities. Acute PT will continue to focus on improving transfer safety and educating pt's family on expectations going forward. DC recs remain appropriate however pt considering other DC disposition.     Recommendations for follow up therapy are one component of a multi-disciplinary discharge planning process, led by the attending physician.  Recommendations may be updated based on patient status, additional functional criteria and insurance authorization.     Assistance Recommended at Discharge Frequent or constant Supervision/Assistance  Patient can return home with the following Two people to help with walking and/or transfers;Two people to help with bathing/dressing/bathroom;Assistance with cooking/housework;Direct supervision/assist for medications management;Direct supervision/assist for financial management;Assist for transportation;Help with stairs or ramp for entrance   Equipment Recommendations  Hospital bed;Rolling walker (2 wheels);BSC/3in1;Other (comment) (Slideboard + drop arm BSC if he doesn't have already. Pt was unsure.)       Precautions / Restrictions Precautions Precautions: Fall Restrictions Weight Bearing Restrictions: Yes LLE Weight Bearing: Weight bearing as tolerated Other Position/Activity Restrictions: R AKA     Mobility  Bed Mobility Overal bed mobility: Needs Assistance Bed Mobility: Supine to Sit, Sit to Supine  Supine to sit: Max assist Sit to supine: Max assist General bed mobility comments: Pt required max assist of one EOB> supine and then return to sitting EOB from supine to use urinal. Pt struggles performing all  mobility due to pain/weakness.    Transfers Overall transfer level: Needs assistance Equipment used: Rolling walker (2 wheels) Transfers: Sit to/from Stand Sit to Stand: Max assist, Total assist Stand pivot transfers: Min assist, +2 physical assistance  General transfer comment: Pt struggles to stand from low recliner surface. Pain/strength limiting. Required author to assist R knee prosthetic into extension to achieve full upright rect posture.    Ambulation/Gait Ambulation/Gait assistance: Mod assist, Max assist Gait Distance  (Feet): 5 Feet Assistive device: Rolling walker (2 wheels) Gait Pattern/deviations: Step-to pattern  General Gait Details: Pt required extensive assistance to ambulate much shorter distances today. pain and strength limiting. Physical lateral wt shift required from author to progress BLEs so pt could take steps. discussed earlier with family that ambulation is not recommended at this time until pt is able to improve transfers and gait safety.    Balance Overall balance assessment: Needs assistance Sitting-balance support: No upper extremity supported, Feet supported Sitting balance-Leahy Scale: Good     Standing balance support: Bilateral upper extremity supported, During functional activity, Reliant on assistive device for balance Standing balance-Leahy Scale: Poor Standing balance comment: pt is extremely high fall risk. Gait belt has been issued to family in previous PT sessions       Cognition Arousal/Alertness: Suspect due to medications, Lethargic, Awake/alert Behavior During Therapy: WFL for tasks assessed/performed Overall Cognitive Status: History of cognitive impairments - at baseline      General Comments: Pt is A and able to follow commands consistently however since Dx Ca has has some cognitive decline. Overall pt is able to consistently follow commands. pain limiting           General Comments General comments (skin integrity, edema, etc.): limited session due to pain. Author will return after pain meds are administered and pt able to tolerate more aggressive PT session. Did review exercies for pt and family to perform to maximize pt's strength in LLE      Pertinent Vitals/Pain Pain Assessment Pain Assessment: 0-10 Pain Score: 7  Pain Location: L hip Pain Descriptors / Indicators: Tender, Sharp Pain Intervention(s): Limited activity within patient's tolerance, Monitored during session, Premedicated before session, Repositioned, Ice applied     PT Goals (current  goals can now be found in the care plan section) Acute Rehab PT Goals Patient Stated Goal: Go home with family ASAP when safe Progress towards PT goals: Not progressing toward goals - comment (Pain limited)    Frequency    BID      PT Plan Current plan remains appropriate       AM-PAC PT "6 Clicks" Mobility   Outcome Measure  Help needed turning from your back to your side while in a flat bed without using bedrails?: A Lot Help needed moving from lying on your back to sitting on the side of a flat bed without using bedrails?: A Lot Help needed moving to and from a bed to a chair (including a wheelchair)?: A Lot Help needed standing up from a chair using your arms (e.g., wheelchair or bedside chair)?: A Lot Help needed to walk in hospital room?: A Lot Help needed climbing 3-5 steps with a railing? : A Lot 6 Click Score: 12    End of Session Equipment Utilized During Treatment: Gait belt (Extensive use of gait belt throughout session) Activity Tolerance: Patient limited by pain;Patient limited by fatigue Patient left: in bed;with call bell/phone within reach;with bed alarm set Nurse Communication: Mobility status PT Visit Diagnosis: Muscle weakness (generalized) (M62.81);Difficulty in walking, not elsewhere classified (R26.2);Pain Pain - Right/Left: Left Pain - part of body: Hip     Time: 1308-6578 PT Time Calculation (min) (ACUTE ONLY): 18 min  Charges:  $Therapeutic Activity: 8-22 mins                     Jetta Lout PTA 10/04/22, 5:49 PM

## 2022-10-04 NOTE — Progress Notes (Signed)
Physical Therapy Treatment Patient Details Name: Mcclain Ottinger. MRN: [id number] DOB: Oct 10, 1940 Today's Date: 10/04/2022   History of Present Illness 82 y.o. male with multiple medical problems including coronary artery disease, chronic CHF, an ascending aortic aneurysm, hypertension, hyperlipidemia, peripheral artery disease, paroxysmal atrial fibrillation requiring Eliquis, and arthritis who is on hospice care for a widely metastatic sarcoma of unknown origin.  The patient is cared for by his family and is, at baseline, more limited recently regarding mobility.  Pt reports gradual onset of increasing L hip pain with non-traumatic episode of displaced fracture leading to hospitalization and subsequent hip pinning 5/21.    PT Comments    Pt was long sitting in bed upon arrival. Agrees to PT/OT session. Discussed at length needing more prosthetic socks for proper fitting of prosthesis. Called son after session and he states he will bring more socks tomorrow. Pt was able to exit R side of bed, stand, and take a few steps to recliner. Pain limited. Will return this PM to advance gait distances and will have pt pre-medicated prior. DC recs remain appropriate.     Recommendations for follow up therapy are one component of a multi-disciplinary discharge planning process, led by the attending physician.  Recommendations may be updated based on patient status, additional functional criteria and insurance authorization.     Assistance Recommended at Discharge Set up Supervision/Assistance  Patient can return home with the following Two people to help with walking and/or transfers;Two people to help with bathing/dressing/bathroom;Assistance with cooking/housework;Assist for transportation;Help with stairs or ramp for entrance   Equipment Recommendations  Hospital bed;Rolling walker (2 wheels);BSC/3in1 (if DCing home)       Precautions / Restrictions Precautions Precautions:  Fall Restrictions Weight Bearing Restrictions: Yes LLE Weight Bearing: Weight bearing as tolerated Other Position/Activity Restrictions: R AKA     Mobility  Bed Mobility Overal bed mobility: Needs Assistance Bed Mobility: Supine to Sit   Transfers Overall transfer level: Needs assistance Equipment used: Rolling walker (2 wheels) Transfers: Bed to chair/wheelchair/BSC Sit to Stand: Mod assist, From elevated surface, +2 physical assistance, +2 safety/equipment Stand pivot transfers: Min assist, +2 physical assistance  General transfer comment: pt was able to stand pivot to recliner with +2 asisst for safety    Ambulation/Gait  General Gait Details: Will focus PM session on ambulation   Balance Overall balance assessment: Needs assistance Sitting-balance support: No upper extremity supported, Feet supported Sitting balance-Leahy Scale: Good     Standing balance support: Bilateral upper extremity supported Standing balance-Leahy Scale: Poor    Cognition Arousal/Alertness: Awake/alert Behavior During Therapy: WFL for tasks assessed/performed Overall Cognitive Status: History of cognitive impairments - at baseline    General Comments: Pt is A and able to follow commands consistently. Per discussion with OT, cognition has been inconsistent           General Comments General comments (skin integrity, edema, etc.): Discussed at length needing more socks for better fit of prosthesis      Pertinent Vitals/Pain Pain Assessment Pain Assessment: 0-10 Pain Score: 9  Pain Location: L hip 10/10 while standing Pain Descriptors / Indicators: Tender, Sharp Pain Intervention(s): Limited activity within patient's tolerance, Monitored during session, Premedicated before session, Repositioned     PT Goals (current goals can now be found in the care plan section) Progress towards PT goals: Progressing toward goals    Frequency    BID      PT Plan Current plan remains  appropriate    Co-evaluation  Reason for Co-Treatment: To address functional/ADL transfers   OT goals addressed during session: ADL's and self-care      AM-PAC PT "6 Clicks" Mobility   Outcome Measure  Help needed turning from your back to your side while in a flat bed without using bedrails?: A Little Help needed moving from lying on your back to sitting on the side of a flat bed without using bedrails?: A Lot Help needed moving to and from a bed to a chair (including a wheelchair)?: A Lot Help needed standing up from a chair using your arms (e.g., wheelchair or bedside chair)?: A Lot Help needed to walk in hospital room?: A Lot Help needed climbing 3-5 steps with a railing? : Total 6 Click Score: 12    End of Session   Activity Tolerance: Patient tolerated treatment well Patient left: in chair;with call bell/phone within reach;with family/visitor present Nurse Communication: Mobility status PT Visit Diagnosis: Muscle weakness (generalized) (M62.81);Difficulty in walking, not elsewhere classified (R26.2);Pain Pain - Right/Left: Left Pain - part of body: Hip     Time: 4098-1191 PT Time Calculation (min) (ACUTE ONLY): 16 min  Charges:  $Therapeutic Activity: 8-22 mins                     Jetta Lout PTA 10/04/22, 12:49 PM

## 2022-10-05 DIAGNOSIS — I10 Essential (primary) hypertension: Secondary | ICD-10-CM | POA: Diagnosis not present

## 2022-10-05 DIAGNOSIS — R262 Difficulty in walking, not elsewhere classified: Secondary | ICD-10-CM | POA: Diagnosis not present

## 2022-10-05 DIAGNOSIS — Z9181 History of falling: Secondary | ICD-10-CM | POA: Diagnosis not present

## 2022-10-05 DIAGNOSIS — R2681 Unsteadiness on feet: Secondary | ICD-10-CM | POA: Diagnosis not present

## 2022-10-05 DIAGNOSIS — C7800 Secondary malignant neoplasm of unspecified lung: Secondary | ICD-10-CM | POA: Diagnosis not present

## 2022-10-05 DIAGNOSIS — R269 Unspecified abnormalities of gait and mobility: Secondary | ICD-10-CM | POA: Diagnosis not present

## 2022-10-05 DIAGNOSIS — Z89611 Acquired absence of right leg above knee: Secondary | ICD-10-CM | POA: Diagnosis not present

## 2022-10-05 DIAGNOSIS — R410 Disorientation, unspecified: Secondary | ICD-10-CM | POA: Diagnosis not present

## 2022-10-05 DIAGNOSIS — I200 Unstable angina: Secondary | ICD-10-CM | POA: Diagnosis not present

## 2022-10-05 DIAGNOSIS — B001 Herpesviral vesicular dermatitis: Secondary | ICD-10-CM | POA: Diagnosis not present

## 2022-10-05 DIAGNOSIS — M84452D Pathological fracture, left femur, subsequent encounter for fracture with routine healing: Secondary | ICD-10-CM | POA: Diagnosis not present

## 2022-10-05 DIAGNOSIS — R1311 Dysphagia, oral phase: Secondary | ICD-10-CM | POA: Diagnosis not present

## 2022-10-05 DIAGNOSIS — Z741 Need for assistance with personal care: Secondary | ICD-10-CM | POA: Diagnosis not present

## 2022-10-05 DIAGNOSIS — M84452A Pathological fracture, left femur, initial encounter for fracture: Secondary | ICD-10-CM

## 2022-10-05 DIAGNOSIS — C7951 Secondary malignant neoplasm of bone: Secondary | ICD-10-CM | POA: Diagnosis not present

## 2022-10-05 DIAGNOSIS — M6281 Muscle weakness (generalized): Secondary | ICD-10-CM | POA: Diagnosis not present

## 2022-10-05 LAB — CBC
HCT: 23.9 % — ABNORMAL LOW (ref 39.0–52.0)
Hemoglobin: 7.6 g/dL — ABNORMAL LOW (ref 13.0–17.0)
MCH: 27.2 pg (ref 26.0–34.0)
MCHC: 31.8 g/dL (ref 30.0–36.0)
MCV: 85.7 fL (ref 80.0–100.0)
Platelets: 343 10*3/uL (ref 150–400)
RBC: 2.79 MIL/uL — ABNORMAL LOW (ref 4.22–5.81)
RDW: 17.8 % — ABNORMAL HIGH (ref 11.5–15.5)
WBC: 12.6 10*3/uL — ABNORMAL HIGH (ref 4.0–10.5)
nRBC: 0 % (ref 0.0–0.2)

## 2022-10-05 MED ORDER — CLOPIDOGREL BISULFATE 75 MG PO TABS: ORAL_TABLET | ORAL | 0 refills | Status: DC

## 2022-10-05 MED ORDER — OXYCODONE HCL 5 MG PO TABS: 5.0000 mg | ORAL_TABLET | ORAL | 0 refills | Status: DC | PRN

## 2022-10-05 MED ORDER — DOCUSATE SODIUM 100 MG PO CAPS: 100.0000 mg | ORAL_CAPSULE | Freq: Two times a day (BID) | ORAL | 0 refills | Status: DC

## 2022-10-05 NOTE — Progress Notes (Signed)
PT Cancellation Note  Patient Details Name: Eric Beltran. MRN: [id number] DOB: 02/01/1941   Cancelled Treatment:     PT attempt. Pt refused requesting PT return after lunch. "Its just hurting to bad." Author reach pt's daughter to discuss POC. She will return around 2pm. Chartered loss adjuster will return at that time.    Rushie Chestnut 10/05/2022, 3:11 PM

## 2022-10-05 NOTE — TOC Progression Note (Signed)
Transition of Care (TOC) - Progression Note    Patient Details  Name: Eric Beltran. MRN: [id number] Date of Birth: 1940-12-01  Transition of Care [facility]) CM/SW Contact  Marlowe Sax, RN Phone Number: 10/05/2022, 12:58 PM  Clinical Narrative:   Spoke with the patient's daughter Maralyn Sago I provided the room number E17 to her, I explained she will also need to take a copy of the Hospice revocation to [facility], She stated understanding EMS was arranged for 2 PM     Expected Discharge Plan: Home w Hospice Care Barriers to Discharge: Continued Medical Work up  Expected Discharge Plan and Services   Discharge Planning Services: CM Consult   Living arrangements for the past 2 months: Single Family Home Expected Discharge Date: 10/05/22               DME Arranged: N/A DME Agency: NA       HH Arranged: NA HH Agency: NA         Social Determinants of Health (SDOH) Interventions SDOH Screenings   Food Insecurity: No Food Insecurity (10/02/2022)  Housing: Low Risk  (10/02/2022)  Transportation Needs: No Transportation Needs (10/02/2022)  Utilities: Not At Risk (10/02/2022)  Tobacco Use: Low Risk  (10/03/2022)    Readmission Risk Interventions    09/21/2021    3:19 PM  Readmission Risk Prevention Plan  Transportation Screening Complete  PCP or Specialist Appt within 3-5 Days Complete  HRI or Home Care Consult Complete  Social Work Consult for Recovery Care Planning/Counseling Complete  Palliative Care Screening Not Applicable  Medication Review Oceanographer) Complete

## 2022-10-05 NOTE — Progress Notes (Signed)
Report given to [place] at [facility].

## 2022-10-05 NOTE — TOC Progression Note (Addendum)
Transition of Care (TOC) - Progression Note    Patient Details  Name: Eric Beltran. MRN: [id number] Date of Birth: Nov 05, 1940  Transition of Care [facility]) CM/SW Contact  Marlowe Sax, RN Phone Number: 10/05/2022, 8:23 AM  Clinical Narrative:   Spoke with the patient's daughter Maralyn Sago, they have decided to go to STR prior to going home, his wife is at [facility] and they would like [facility], they have spoke with [facility] already, I explained the process and that we will need to get Insurance approval prior to DC, she stated understanding  I called Ricky at [facility] to confirm that the patient will have a bed there,   Ins has been approved and [facility] confirmed that they have a bed for him, they will need the paper showing Hospice revoked, I notified his daughter Maralyn Sago, she is going to get the paper today from hospice and send to [place] at [facility] He will go to room E17  Faxed the Revoke form for Hospice to   [phone]   Expected Discharge Plan: Home w Hospice Care Barriers to Discharge: Continued Medical Work up  Expected Discharge Plan and Services   Discharge Planning Services: CM Consult   Living arrangements for the past 2 months: Single Family Home                 DME Arranged: N/A DME Agency: NA       HH Arranged: NA HH Agency: NA         Social Determinants of Health (SDOH) Interventions SDOH Screenings   Food Insecurity: No Food Insecurity (10/02/2022)  Housing: Low Risk  (10/02/2022)  Transportation Needs: No Transportation Needs (10/02/2022)  Utilities: Not At Risk (10/02/2022)  Tobacco Use: Low Risk  (10/03/2022)    Readmission Risk Interventions    09/21/2021    3:19 PM  Readmission Risk Prevention Plan  Transportation Screening Complete  PCP or Specialist Appt within 3-5 Days Complete  HRI or Home Care Consult Complete  Social Work Consult for Recovery Care Planning/Counseling Complete  Palliative Care Screening Not Applicable  Medication Review Special educational needs teacher) Complete

## 2022-10-05 NOTE — Plan of Care (Signed)

## 2022-10-05 NOTE — Progress Notes (Signed)
Physical Therapy Treatment Patient Details Name: Eric Beltran. MRN: [id number] DOB: 1940/12/30 Today's Date: 10/05/2022   History of Present Illness 82 y.o. male with multiple medical problems including coronary artery disease, chronic CHF, an ascending aortic aneurysm, hypertension, hyperlipidemia, peripheral artery disease, paroxysmal atrial fibrillation requiring Eliquis, and arthritis who is on hospice care for a widely metastatic sarcoma of unknown origin.  The patient is cared for by his family and is, at baseline, more limited recently regarding mobility.  Pt reports gradual onset of increasing L hip pain with non-traumatic episode of displaced fracture leading to hospitalization and subsequent hip pinning 5/21.    PT Comments    Pt was long sitting in bed upon arrival. He is A but somewhat lethargic from recent pain medications issued. He requested to get on/off BSC for BM. Pt requires extensive assistance to exit bed, stand to RW and pivot to/from [facility]. He required a lot of assistance to properly place prosthetic RLE. Prosthetic socks still not present but family reports they will bring to him at rehab. Pt has been severely pain and strength limited. He will greatly benefit from continued skilled PT to maximize his independence and safety with all ADLs.    Recommendations for follow up therapy are one component of a multi-disciplinary discharge planning process, led by the attending physician.  Recommendations may be updated based on patient status, additional functional criteria and insurance authorization.     Assistance Recommended at Discharge Frequent or constant Supervision/Assistance  Patient can return home with the following Two people to help with walking and/or transfers;Two people to help with bathing/dressing/bathroom;Assistance with cooking/housework;Direct supervision/assist for medications management;Direct supervision/assist for financial management;Assist for  transportation;Help with stairs or ramp for entrance   Equipment Recommendations  Other (comment) (Defer to next level of care)       Precautions / Restrictions Precautions Precautions: Fall Restrictions Weight Bearing Restrictions: Yes LLE Weight Bearing: Weight bearing as tolerated     Mobility  Bed Mobility Overal bed mobility: Needs Assistance Bed Mobility: Supine to Sit, Sit to Supine  Supine to sit: Max assist Sit to supine: Max assist   Transfers Overall transfer level: Needs assistance Equipment used: Rolling walker (2 wheels) Transfers: Sit to/from Stand Sit to Stand: Max assist, Total assist, From elevated surface Stand pivot transfers: Max assist, From elevated surface   Ambulation/Gait Ambulation/Gait assistance: Max assist, Total assist, +2 safety/equipment Gait Distance (Feet): 2 Feet Assistive device: Rolling walker (2 wheels) Gait Pattern/deviations: Step-to pattern  General Gait Details: pt continues to struggle to stand fully erect and required constant assistance to prevent falling. author manually assisted LEs to take steps/ pivot   Balance Overall balance assessment: Needs assistance Sitting-balance support: Bilateral upper extremity supported, Feet supported Sitting balance-Leahy Scale: Fair Sitting balance - Comments: even sitting balance required more assistance today. Author questions if due to pain medications   Standing balance support: Bilateral upper extremity supported, During functional activity, Reliant on assistive device for balance Standing balance-Leahy Scale: Poor Standing balance comment: pt is extremely high fall risk. Gait belt has been issued to family in previous PT sessions       Cognition Arousal/Alertness: Lethargic, Suspect due to medications Behavior During Therapy: WFL for tasks assessed/performed Overall Cognitive Status: History of cognitive impairments - at baseline    General Comments: Pt is A and able to follow  commands consistently however since Dx Ca has has some cognitive decline. Overall pt is able to consistently follow commands. pain limiting  General Comments General comments (skin integrity, edema, etc.): Pt required asistance to reposition LLE in bed due to pain      Pertinent Vitals/Pain Pain Assessment Pain Assessment: 0-10 Pain Score: 7  Pain Location: L hip Pain Descriptors / Indicators: Tender, Sharp Pain Intervention(s): Limited activity within patient's tolerance, Monitored during session, Premedicated before session, Repositioned     PT Goals (current goals can now be found in the care plan section) Acute Rehab PT Goals Patient Stated Goal: rehab then home Progress towards PT goals: Not progressing toward goals - comment (pain and strength limited)    Frequency    BID      PT Plan Current plan remains appropriate       AM-PAC PT "6 Clicks" Mobility   Outcome Measure  Help needed turning from your back to your side while in a flat bed without using bedrails?: A Lot Help needed moving from lying on your back to sitting on the side of a flat bed without using bedrails?: A Lot Help needed moving to and from a bed to a chair (including a wheelchair)?: A Lot Help needed standing up from a chair using your arms (e.g., wheelchair or bedside chair)?: A Lot Help needed to walk in hospital room?: Total Help needed climbing 3-5 steps with a railing? : Total 6 Click Score: 10    End of Session Equipment Utilized During Treatment: Gait belt (extensive use of gait belt to stand and to step/pivot) Activity Tolerance: Patient limited by pain Patient left: in bed;with call bell/phone within reach;with bed alarm set Nurse Communication: Mobility status PT Visit Diagnosis: Muscle weakness (generalized) (M62.81);Difficulty in walking, not elsewhere classified (R26.2);Pain Pain - Right/Left: Left Pain - part of body: Hip     Time: 1430-1453 PT Time Calculation  (min) (ACUTE ONLY): 23 min  Charges:  $Therapeutic Activity: 23-37 mins                    Jetta Lout PTA 10/05/22, 3:07 PM

## 2022-10-05 NOTE — Discharge Summary (Signed)
Physician Discharge Summary   Eric Beltran.  male DOB: July 03, 1940  ZOX:[id number]  PCP: Benita Stabile, MD  Admit date: 10/02/2022 Discharge date: 10/05/2022  Admitted From: home Disposition:  SNF rehab CODE STATUS: Full code   Hospital Course:  For full details, please see H&P, progress notes, consult notes and ancillary notes.  Briefly,  Eric Beltran. 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  Left femur x-ray showed an acute oblique fracture of the proximal left femoral diaphysis with displacement and varus angulation. There is approximately a 5 cm override and marked varus angulation of the distal fracture fragment.   * Pathological fracture, left femur, initial encounter for fracture (HCC) S/p ORIF on 10/02/22, tolerated the procedure well. --there was some minor surgical site bleeding, however, ortho cleared pt for discharge and to continue eliquis for DVT ppx. --WBAT to the left leg  --follow-up with [facility] in 10-14 days for staple removal and x-rays of the left femur.    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 The cause of his pathologic fracture left femur.  He is currently under hospice care   Metastatic 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 [facility]) Pt has stopped all chemo.   Paroxysmal atrial fibrillation (HCC) Stable.  Continue home Cardizem and amiodarone  --cont home Eliquis   Coronary artery disease involving coronary bypass graft of native heart with angina pectoris (HCC) Dtr stated pt was taken off statin by PCP 1-2 weeks ago.   --home plavix was not ordered during current hospitalization, and will continue to be held, per ortho, due to minor surgical site bleeding, pending outpatient f/u.    Essential hypertension Stable.  --cont home cozaar and cardizem and Imdur. --pt not taking Lisinopril PTA.   Unless noted above, medications under "STOP" list are ones pt was not taking PTA.  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 The score is based on dignosis, age, lab data, medications, orders, and past utilization.   Low:  0-14.9   Medium: 15-21.9   High: 22-29.9   Extreme: 30 and above         Discharge Instructions:  Allergies as of 10/05/2022   No Known Allergies      Medication List     STOP taking these medications    atorvastatin 80 MG tablet Commonly known as: LIPITOR   lisinopril 10 MG tablet Commonly known as: ZESTRIL   pantoprazole 40 MG tablet Commonly known as: PROTONIX   pazopanib 200 MG tablet Commonly known as: VOTRIENT       TAKE these medications    amiodarone 200 MG  tablet Commonly known as: PACERONE Take 100 mg by mouth daily.   clopidogrel 75 MG tablet Commonly known as: PLAVIX Hold until outpatient followup due to bleeding at surgical site. What changed:  how much to take how to take this when to take this additional instructions   diltiazem 120 MG 12 hr capsule Commonly known as: CARDIZEM SR Take 1 capsule (120 mg total) by mouth daily.   docusate  sodium 100 MG capsule Commonly known as: COLACE Take 1 capsule (100 mg total) by mouth 2 (two) times daily.   Eliquis 5 MG Tabs tablet Generic drug: apixaban TAKE 1 TABLET BY MOUTH TWICE DAILY   Fish Oil 1000 MG Caps Take 2,000 mg by mouth daily.   FT Senna-S 8.6-50 MG tablet Generic drug: senna-docusate Take 1 tablet by mouth at bedtime as needed.   furosemide 20 MG tablet Commonly known as: LASIX TAKE 2 TABLETS BY MOUTH DAILY AS NEEDED FOR EDEMA OR FLUID   gabapentin 300 MG capsule Commonly known as: NEURONTIN Take 300 mg by mouth daily.   glipiZIDE 5 MG tablet Commonly known as: GLUCOTROL Take 1 tablet (5 mg total) by mouth daily before breakfast.   isosorbide mononitrate 60 MG 24 hr tablet Commonly known as: IMDUR TAKE 1 TABLET BY MOUTH TWICE DAILY every morning AND in THE evening   losartan 50 MG tablet Commonly known as: COZAAR Take 2 tablets (100 mg total) by mouth daily.   magnesium gluconate 500 MG tablet Commonly known as: MAGONATE Take 500 mg by mouth 2 (two) times daily.   metFORMIN 500 MG tablet Commonly known as: Glucophage Take 1 tablet (500 mg total) by mouth 2 (two) times daily with a meal.   multivitamin tablet Take 1 tablet by mouth at bedtime.   nitroGLYCERIN 0.4 MG SL tablet Commonly known as: NITROSTAT DISSOLVE 1 TABLET UNDER THE TONGUE EVERY 5 MINUTES AS NEEDED FOR CHEST PAIN. DO NOT EXCEED A TOTAL OF 3 DOSES IN 15 MINUTES.   OLANZapine 5 MG tablet Commonly known as: ZYPREXA Take 5 mg by mouth at bedtime.   oxyCODONE 5 MG immediate release tablet Commonly known as: Oxy IR/ROXICODONE Take 1 tablet (5 mg total) by mouth every 4 (four) hours as needed.   polyethylene glycol 17 g packet Commonly known as: MIRALAX / GLYCOLAX Take 17 g by mouth daily.   tamsulosin 0.4 MG Caps capsule Commonly known as: FLOMAX Take 0.4 mg by mouth at bedtime.   valACYclovir 500 MG tablet Commonly known as: VALTREX Take 500 mg by mouth daily as needed  (fever blisters).   Vitamin C 500 MG Caps Take 1 tablet by mouth daily.   zinc gluconate 50 MG tablet Take 50 mg by mouth at bedtime.         Follow-up Information     Benita Stabile, MD Follow up.   Specialty: Internal Medicine Contact information: [address] [facility] [address] [phone]         Poggi, Excell Seltzer, MD Follow up in 10 day(s).   Specialty: Orthopedic Surgery Contact information: [address] [facility] [place] [address] [phone]                 No Known Allergies   The results of significant diagnostics from this hospitalization (including imaging, microbiology, ancillary and laboratory) are listed below for reference.   Consultations:   Procedures/Studies: DG HIP UNILAT WITH PELVIS 2-3 VIEWS LEFT  Result Date: 10/02/2022 CLINICAL DATA:  Surgery, left intramedullary nail EXAM: DG HIP (WITH OR WITHOUT PELVIS) 2-3V LEFT COMPARISON:  Left hip x-ray 10/01/2022 FINDINGS: Seven intraoperative fluoroscopic views of the left hip. Left femoral intramedullary nail and hip screw were placed fixating a proximal femoral diaphyseal fracture. Alignment is anatomic. Fluoroscopy time 1:01 minutes. IMPRESSION: Left femoral intramedullary nail and hip screw fixating a proximal femoral diaphyseal fracture. Electronically Signed   By: Darliss Cheney M.D.   On: 10/02/2022 18:04   DG C-Arm 1-60 Min-No Report  Result Date: 10/02/2022 Fluoroscopy was utilized by the requesting physician.  No radiographic interpretation.   DG C-Arm 1-60 Min-No Report  Result Date: 10/02/2022 Fluoroscopy was utilized by the requesting physician.  No radiographic interpretation.   DG FEMUR MIN 2 VIEWS LEFT  Result Date: 10/02/2022 CLINICAL DATA:  Left thigh pain, tenderness EXAM: LEFT FEMUR 2 VIEWS COMPARISON:  None Available. FINDINGS: There is an acute, oblique, overriding fracture of the proximal left femoral diaphysis with roughly 5 cm override  and marked varus angulation of the distal fracture fragment. Left femoral head is still seated within the left acetabulum. Mild left hip degenerative arthritis is present. Distal left femur appears intact. Advanced vascular calcifications are noted. IMPRESSION: 1. Acute, oblique, overriding fracture of the proximal left femoral diaphysis. Electronically Signed   By: Helyn Numbers M.D.   On: 10/02/2022 00:03   DG Hip Unilat W or Wo Pelvis 2-3 Views Left  Result Date: 10/02/2022 CLINICAL DATA:  Left thigh pain EXAM: DG HIP (WITH OR WITHOUT PELVIS) 2-3V LEFT COMPARISON:  None Available. FINDINGS: An acute, oblique fracture of the proximal left femoral diaphysis is present with 5 cm override at, 1 shaft with lateral displacement, and marked varus angulation of the distal fracture fragment. The proximal left femur is still seated within the left acetabulum. Left hip joint space is preserved. Pelvis is intact. Right total hip arthroplasty has been performed. Advanced vascular calcifications are noted. IMPRESSION: 1. Acute, oblique fracture of the proximal left femoral diaphysis with displacement and varus angulation. Electronically Signed   By: Helyn Numbers M.D.   On: 10/02/2022 00:01      Labs: BNP (last 3 results) No results for input(s): "BNP" in the last 8760 hours. Basic Metabolic Panel: Recent Labs  Lab 10/02/22 0154 10/03/22 0426  NA 135 135  K 3.9 4.9  CL 102 103  CO2 24 25  GLUCOSE 144* 151*  BUN 15 16  CREATININE 0.73 0.62  CALCIUM 8.2* 8.7*   Liver Function Tests: No results for input(s): "AST", "ALT", "ALKPHOS", "BILITOT", "PROT", "ALBUMIN" in the last 168 hours. No results for input(s): "LIPASE", "AMYLASE" in the last 168 hours. No results for input(s): "AMMONIA" in the last 168 hours. CBC: Recent Labs  Lab 10/02/22 0154 10/03/22 0426 10/05/22 0550  WBC 15.4* 14.0* 12.6*  NEUTROABS 12.4*  --   --   HGB 9.0* 8.5* 7.6*  HCT 29.2* 27.3* 23.9*  MCV 89.8 88.6 85.7  PLT  336 327 343   Cardiac Enzymes: No results for input(s): "CKTOTAL", "CKMB", "CKMBINDEX", "TROPONINI" in the last 168 hours. BNP: Invalid input(s): "POCBNP" CBG: No results for input(s): "GLUCAP" in the last 168 hours. D-Dimer No results for input(s): "DDIMER" in the last 72 hours. Hgb A1c No results for input(s): "HGBA1C" in the last 72 hours. Lipid Profile No results for input(s): "CHOL", "HDL", "LDLCALC", "TRIG", "CHOLHDL", "LDLDIRECT" in the last 72 hours. Thyroid function studies No results for input(s): "TSH", "T4TOTAL", "T3FREE", "THYROIDAB" in the last 72 hours.  Invalid input(s): "FREET3" Anemia work up No results for input(s): "VITAMINB12", "FOLATE", "FERRITIN", "TIBC", "IRON", "RETICCTPCT" in the last 72 hours. Urinalysis    Component Value Date/Time   COLORURINE YELLOW 07/25/2021 0510   APPEARANCEUR CLEAR 07/25/2021 0510   LABSPEC 1.016 07/25/2021 0510   PHURINE 6.0 07/25/2021 0510   GLUCOSEU NEGATIVE 07/25/2021 0510   HGBUR NEGATIVE 07/25/2021 0510   BILIRUBINUR NEGATIVE 07/25/2021 0510   KETONESUR NEGATIVE 07/25/2021 0510   PROTEINUR NEGATIVE 07/25/2021 0510   UROBILINOGEN 0.2 06/11/2012 2038   NITRITE NEGATIVE 07/25/2021 0510   LEUKOCYTESUR NEGATIVE 07/25/2021 0510   Sepsis Labs Recent Labs  Lab 10/02/22 0154 10/03/22 0426 10/05/22 0550  WBC 15.4* 14.0* 12.6*   Microbiology No results found for this or any previous visit (from the past 240 hour(s)).   Total time spend on discharging this patient, including the last patient exam, discussing the hospital stay, instructions for ongoing care as it relates to all pertinent caregivers, as well as preparing the medical discharge records, prescriptions, and/or referrals as applicable, is 40 minutes.    Darlin Priestly, MD  [facility] 10/05/2022, 10:57 AM

## 2022-10-05 NOTE — Care Management Important Message (Signed)
Important Message  Patient Details  Name: Eric Beltran. MRN: [id number] Date of Birth: 12/04/1940   Medicare Important Message Given:  Yes     Olegario Messier A Jaymeson Mengel 10/05/2022, 11:17 AM

## 2022-10-05 NOTE — Progress Notes (Signed)
Subjective: 3 Days Post-Op Procedure(s) (LRB): INTRAMEDULLARY (IM) NAIL INTERTROCHANTERIC (Left) Patient reports pain as mild in the left leg this morning.  Does report that the leg gets very sore after he has worked with PT. Patient is well, and has had no acute complaints or problems PT and care management to assist with discharge planning, patient already on hospice due to metastatic sarcoma.  Current plan is for discharge home.  Family in discussion with internal medicine team and care management on holding Hospice services so he may undergone HHPT. Negative for chest pain and shortness of breath Fever: no Gastrointestinal:Negative for nausea and vomiting  Objective: Vital signs in last 24 hours: Temp:  [98 F (36.7 C)-98.4 F (36.9 C)] 98.1 F (36.7 C) (05/23 2324) Pulse Rate:  [70-86] 86 (05/23 2324) Resp:  [18-20] 18 (05/23 2324) BP: (111-136)/(54-67) 136/67 (05/23 2324) SpO2:  [96 %-99 %] 99 % (05/23 2324) Weight:  [64.3 kg] 64.3 kg (05/24 0500)  Intake/Output from previous day:  Intake/Output Summary (Last 24 hours) at 10/05/2022 0724 Last data filed at 10/05/2022 0300 Gross per 24 hour  Intake 660 ml  Output 2295 ml  Net -1635 ml    Intake/Output this shift: No intake/output data recorded.  Labs: Recent Labs    10/03/22 0426 10/05/22 0550  HGB 8.5* 7.6*   Recent Labs    10/03/22 0426 10/05/22 0550  WBC 14.0* 12.6*  RBC 3.08* 2.79*  HCT 27.3* 23.9*  PLT 327 343   Recent Labs    10/03/22 0426  NA 135  K 4.9  CL 103  CO2 25  BUN 16  CREATININE 0.62  GLUCOSE 151*  CALCIUM 8.7*   No results for input(s): "LABPT", "INR" in the last 72 hours.    EXAM General - Patient is Alert, Appropriate, and Oriented Extremity - ABD soft Neurovascular intact Dorsiflexion/Plantar flexion intact Incision: Bloody drainage noted to the proximal and middle incision. No cellulitis present Compartment soft Dressing/Incision - Mild bloody drainage noted to the  proximal and middle incision site. Motor Function - intact, moving foot and toes well on exam.  Abdomen soft with intact bowel sounds this morning.  Past Medical History:  Diagnosis Date   Arthritis    Ascending aortic aneurysm (HCC)    Status post repair 12/2011 at [facility]   BPH (benign prostatic hyperplasia)    Chronic diastolic CHF (congestive heart failure) (HCC)    Coronary atherosclerosis of native coronary artery    a. Multivessel status post prior stenting and ultimately CABG 12/2011 at [facility] with with LIMA to LAD, SVG to PLB, SVG to ramus, SVG to OM . b. Last cath 06/2014 (following ischemic nuc) -> medical therapy, no feasible way to revascularize LCx territory.   Essential hypertension    Mixed hyperlipidemia    Myocardial infarction (HCC)    PAD (peripheral artery disease) (HCC)    PAF (paroxysmal atrial fibrillation) (HCC)    Supraventricular tachycardia     Assessment/Plan: 3 Days Post-Op Procedure(s) (LRB): INTRAMEDULLARY (IM) NAIL INTERTROCHANTERIC (Left) Principal Problem:   Pathological fracture, left femur, initial encounter for fracture [facility]) Active Problems:   Essential hypertension   Coronary artery disease involving coronary bypass graft of native heart with angina pectoris (HCC)   Paroxysmal atrial fibrillation (HCC)   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   Weight  as of this encounter: 64.3 kg. Advance diet Up with therapy D/C IV fluids when tolerating po intake.  Vitals reviewed this AM.  WBC down to 12.6, Hg 7.6 this AM.   No significant drainage noted to the left leg.  No active bleeding. Up with therapy today, can WBAT to the left leg. Patient has had a BM.  After discharge from hospital, follow-up with [facility] in 10-14 days for staple removal and x-rays of the left femur. Continue on  Eliquis and Plavix following surgery for DVT prophylaxis.  DVT Prophylaxis -  Eliquis Weight-Bearing as tolerated to left leg, s/p right lower extremity amputation.  Valeria Batman, PA-C [facility] 10/05/2022, 7:24 AM

## 2022-10-05 NOTE — Progress Notes (Signed)
Attempted report to [facility]. Per Artis Flock she will call me back.

## 2022-10-05 NOTE — Plan of Care (Signed)
  Problem: Clinical Measurements: Goal: Will remain free from infection Outcome: Progressing   Problem: Clinical Measurements: Goal: Respiratory complications will improve Outcome: Progressing   Problem: Activity: Goal: Risk for activity intolerance will decrease Outcome: Progressing   Problem: Safety: Goal: Ability to remain free from injury will improve Outcome: Progressing   Problem: Pain Managment: Goal: General experience of comfort will improve Outcome: Progressing

## 2022-10-05 NOTE — NC FL2 (Signed)
[place] MEDICAID FL2 LEVEL OF CARE FORM     IDENTIFICATION  Patient Name: Eric Beltran. Birthdate: 10-17-1940 Sex: male Admission Date (Current Location): 10/02/2022  [facility] and [place] Number:  Chiropodist and Address:  [facility], [address] [place], [place] [address]      Provider Number: [id number]  Attending Physician Name and Address:  Darlin Priestly, MD  Relative Name and Phone Number:  [place] Nickel [phone]    Current Level of Care: Hospital Recommended Level of Care: Skilled Nursing Facility Prior Approval Number:    Date Approved/Denied:   PASRR Number: [id number]  Discharge Plan: SNF    Current Diagnoses: Patient Active Problem List   Diagnosis Date Noted   Pathological fracture, left femur, initial encounter for fracture (HCC) 10/02/2022   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 (HCC) 10/02/2022   Metastatic 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 [facility])    Paroxysmal atrial fibrillation (HCC) 07/25/2021   BPH (benign prostatic hyperplasia) 07/25/2021   Mass of left side of neck 11/25/2018   Coronary artery disease    S/P CABG x 4 08/06/2017   Coronary artery disease involving coronary bypass graft of native heart with angina pectoris [facility])    BPH with obstruction/lower urinary tract symptoms 03/06/2017   Unstable angina (HCC) 03/05/2017   Lumbar spondylosis 02/19/2017   Osteoarthritis of right hip 02/07/2016   Status post total replacement of right hip 02/07/2016   Abnormal cardiovascular function study    Abnormal myocardial perfusion study 06/09/2014   Cardiac murmur 05/21/2014   Renal mass 07/31/2012   Actinic keratosis 01/17/2012   Personal history of malignant neoplasm of skin 10/12/2010   Aneurysm of thoracic aorta (HCC) 04/13/2010   Mixed hyperlipidemia 07/20/2009   Essential hypertension 07/20/2009    Orientation RESPIRATION BLADDER Height & Weight     Self, Time, Situation, Place  Normal Continent Weight: 64.3 kg Height:   5\' 7"  (170.2 cm)  BEHAVIORAL SYMPTOMS/MOOD NEUROLOGICAL BOWEL NUTRITION STATUS      Continent Diet (See DC summary)  AMBULATORY STATUS COMMUNICATION OF NEEDS Skin   Extensive Assist Verbally Normal, Surgical wounds                       Personal Care Assistance Level of Assistance  Bathing, Feeding, Dressing Bathing Assistance: Maximum assistance Feeding assistance: Limited assistance Dressing Assistance: Maximum assistance     Functional Limitations Info  Sight, Hearing, Speech Sight Info: Adequate Hearing Info: Adequate Speech Info: Adequate    SPECIAL CARE FACTORS FREQUENCY  PT (By licensed PT), OT (By licensed OT)     PT Frequency: 5 times per week OT Frequency: 5 times per week            Contractures Contractures Info: Not present    Additional Factors Info  Code Status, Allergies Code Status Info: FUll Code Allergies Info: NKDA           Current Medications (10/05/2022):  This is the current hospital active medication list Current Facility-Administered Medications  Medication Dose Route Frequency Provider Last Rate Last Admin   0.9 %  sodium chloride infusion   Intravenous Continuous Poggi, Excell Seltzer, MD       acetaminophen (TYLENOL) tablet 650 mg  650 mg Oral Q6H PRN Poggi, Excell Seltzer, MD   650 mg at 10/03/22 2238   Or   acetaminophen (TYLENOL) suppository 650 mg  650 mg Rectal Q6H PRN Poggi, Excell Seltzer, MD       amiodarone (  PACERONE) tablet 100 mg  100 mg Oral Daily Poggi, Excell Seltzer, MD   100 mg at 10/04/22 0900   apixaban (ELIQUIS) tablet 5 mg  5 mg Oral BID Poggi, Excell Seltzer, MD   5 mg at 10/04/22 0900   ascorbic acid (VITAMIN C) tablet 500 mg  500 mg Oral Daily Poggi, Excell Seltzer, MD   500 mg at 10/04/22 0901   bisacodyl (DULCOLAX) suppository 10 mg  10 mg Rectal Daily PRN Christena Flake, MD   10 mg at 10/05/22 0126   diltiazem (CARDIZEM SR) 12 hr capsule 120 mg  120 mg Oral Daily Poggi, Excell Seltzer, MD   120 mg at 10/03/22 1029   diphenhydrAMINE (BENADRYL) 12.5 MG/5ML elixir  12.5-25 mg  12.5-25 mg Oral Q4H PRN Poggi, Excell Seltzer, MD       docusate sodium (COLACE) capsule 100 mg  100 mg Oral BID Poggi, Excell Seltzer, MD   100 mg at 10/04/22 2139   gabapentin (NEURONTIN) capsule 300 mg  300 mg Oral Daily Poggi, Excell Seltzer, MD   300 mg at 10/04/22 0901   HYDROmorphone (DILAUDID) injection 1 mg  1 mg Intravenous Q2H PRN Poggi, Excell Seltzer, MD   1 mg at 10/04/22 1553   isosorbide mononitrate (IMDUR) 24 hr tablet 60 mg  60 mg Oral Daily Poggi, Excell Seltzer, MD   60 mg at 10/04/22 0900   losartan (COZAAR) tablet 100 mg  100 mg Oral Daily Poggi, Excell Seltzer, MD   100 mg at 10/03/22 1030   magnesium hydroxide (MILK OF MAGNESIA) suspension 30 mL  30 mL Oral Daily PRN Poggi, Excell Seltzer, MD       metoCLOPramide (REGLAN) tablet 5-10 mg  5-10 mg Oral Q8H PRN Poggi, Excell Seltzer, MD       Or   metoCLOPramide (REGLAN) injection 5-10 mg  5-10 mg Intravenous Q8H PRN Poggi, Excell Seltzer, MD       OLANZapine (ZYPREXA) tablet 5 mg  5 mg Oral QHS Poggi, Excell Seltzer, MD   5 mg at 10/04/22 2139   ondansetron (ZOFRAN) tablet 4 mg  4 mg Oral Q6H PRN Poggi, Excell Seltzer, MD       Or   ondansetron (ZOFRAN) injection 4 mg  4 mg Intravenous Q6H PRN Poggi, Excell Seltzer, MD       Oral care mouth rinse  15 mL Mouth Rinse PRN Arnetha Courser, MD       sodium phosphate (FLEET) 7-19 GM/118ML enema 1 enema  1 enema Rectal Once PRN Poggi, Excell Seltzer, MD       tamsulosin [facility]) capsule 0.4 mg  0.4 mg Oral QHS Poggi, Excell Seltzer, MD   0.4 mg at 10/04/22 2139     Discharge Medications: Please see discharge summary for a list of discharge medications.  Relevant Imaging Results:  Relevant Lab Results:   Additional Information SS# [id number]  Marlowe Sax, RN

## 2022-10-09 DIAGNOSIS — C7800 Secondary malignant neoplasm of unspecified lung: Secondary | ICD-10-CM | POA: Diagnosis not present

## 2022-10-09 DIAGNOSIS — C7951 Secondary malignant neoplasm of bone: Secondary | ICD-10-CM | POA: Diagnosis not present

## 2022-10-09 DIAGNOSIS — M84452D Pathological fracture, left femur, subsequent encounter for fracture with routine healing: Secondary | ICD-10-CM | POA: Diagnosis not present

## 2022-10-09 DIAGNOSIS — Z89611 Acquired absence of right leg above knee: Secondary | ICD-10-CM | POA: Diagnosis not present

## 2022-10-09 NOTE — Anesthesia Postprocedure Evaluation (Signed)
Anesthesia Post Note  Patient: Eric Beltran.  Procedure(s) Performed: INTRAMEDULLARY (IM) NAIL INTERTROCHANTERIC (Left: Hip)  Patient location during evaluation: PACU Anesthesia Type: General Level of consciousness: awake and alert Pain management: pain level controlled Vital Signs Assessment: post-procedure vital signs reviewed and stable Respiratory status: spontaneous breathing, nonlabored ventilation, respiratory function stable and patient connected to nasal cannula oxygen Cardiovascular status: blood pressure returned to baseline and stable Postop Assessment: no apparent nausea or vomiting Anesthetic complications: no   No notable events documented.   Last Vitals:  Vitals:   10/05/22 0804 10/05/22 1635  BP: (!) 140/69 108/61  Pulse: 84 76  Resp: 17 17  Temp: 36.7 C   SpO2: 98% 95%    Last Pain:  Vitals:   10/05/22 1420  TempSrc:   PainSc: 1                  Yevette Edwards

## 2022-10-14 DIAGNOSIS — M84452D Pathological fracture, left femur, subsequent encounter for fracture with routine healing: Secondary | ICD-10-CM | POA: Diagnosis not present

## 2022-10-15 DIAGNOSIS — Z89611 Acquired absence of right leg above knee: Principal | ICD-10-CM

## 2022-10-23 DIAGNOSIS — M545 Low back pain, unspecified: Secondary | ICD-10-CM | POA: Diagnosis not present

## 2022-10-23 DIAGNOSIS — M533 Sacrococcygeal disorders, not elsewhere classified: Secondary | ICD-10-CM | POA: Diagnosis not present

## 2022-10-23 DIAGNOSIS — D72820 Lymphocytosis (symptomatic): Secondary | ICD-10-CM | POA: Diagnosis not present

## 2022-10-23 DIAGNOSIS — I10 Essential (primary) hypertension: Secondary | ICD-10-CM | POA: Diagnosis not present

## 2022-10-23 DIAGNOSIS — R102 Pelvic and perineal pain: Secondary | ICD-10-CM | POA: Diagnosis not present

## 2022-10-25 DIAGNOSIS — S32020D Wedge compression fracture of second lumbar vertebra, subsequent encounter for fracture with routine healing: Secondary | ICD-10-CM | POA: Diagnosis not present

## 2022-10-27 DIAGNOSIS — K5900 Constipation, unspecified: Secondary | ICD-10-CM | POA: Diagnosis not present

## 2022-10-27 DIAGNOSIS — E119 Type 2 diabetes mellitus without complications: Secondary | ICD-10-CM | POA: Diagnosis not present

## 2022-10-27 DIAGNOSIS — B001 Herpesviral vesicular dermatitis: Secondary | ICD-10-CM | POA: Diagnosis not present

## 2022-10-27 DIAGNOSIS — Z79891 Long term (current) use of opiate analgesic: Secondary | ICD-10-CM | POA: Diagnosis not present

## 2022-10-27 DIAGNOSIS — I480 Paroxysmal atrial fibrillation: Secondary | ICD-10-CM | POA: Diagnosis not present

## 2022-10-27 DIAGNOSIS — I25729 Atherosclerosis of autologous artery coronary artery bypass graft(s) with unspecified angina pectoris: Secondary | ICD-10-CM | POA: Diagnosis not present

## 2022-10-27 DIAGNOSIS — C7800 Secondary malignant neoplasm of unspecified lung: Secondary | ICD-10-CM | POA: Diagnosis not present

## 2022-10-27 DIAGNOSIS — I10 Essential (primary) hypertension: Secondary | ICD-10-CM | POA: Diagnosis not present

## 2022-10-27 DIAGNOSIS — N400 Enlarged prostate without lower urinary tract symptoms: Secondary | ICD-10-CM | POA: Diagnosis not present

## 2022-10-27 DIAGNOSIS — L89152 Pressure ulcer of sacral region, stage 2: Secondary | ICD-10-CM | POA: Diagnosis not present

## 2022-10-27 DIAGNOSIS — E785 Hyperlipidemia, unspecified: Secondary | ICD-10-CM | POA: Diagnosis not present

## 2022-10-27 DIAGNOSIS — M84552D Pathological fracture in neoplastic disease, left femur, subsequent encounter for fracture with routine healing: Secondary | ICD-10-CM | POA: Diagnosis not present

## 2022-10-27 DIAGNOSIS — R1311 Dysphagia, oral phase: Secondary | ICD-10-CM | POA: Diagnosis not present

## 2022-10-27 DIAGNOSIS — C7951 Secondary malignant neoplasm of bone: Secondary | ICD-10-CM | POA: Diagnosis not present

## 2022-10-27 DIAGNOSIS — C499 Malignant neoplasm of connective and soft tissue, unspecified: Secondary | ICD-10-CM | POA: Diagnosis not present

## 2022-10-27 DIAGNOSIS — Z7901 Long term (current) use of anticoagulants: Secondary | ICD-10-CM | POA: Diagnosis not present

## 2022-10-30 DIAGNOSIS — M84552D Pathological fracture in neoplastic disease, left femur, subsequent encounter for fracture with routine healing: Secondary | ICD-10-CM | POA: Diagnosis not present

## 2022-10-30 DIAGNOSIS — Z79891 Long term (current) use of opiate analgesic: Secondary | ICD-10-CM | POA: Diagnosis not present

## 2022-10-30 DIAGNOSIS — N400 Enlarged prostate without lower urinary tract symptoms: Secondary | ICD-10-CM | POA: Diagnosis not present

## 2022-10-30 DIAGNOSIS — I25729 Atherosclerosis of autologous artery coronary artery bypass graft(s) with unspecified angina pectoris: Secondary | ICD-10-CM | POA: Diagnosis not present

## 2022-10-30 DIAGNOSIS — E785 Hyperlipidemia, unspecified: Secondary | ICD-10-CM | POA: Diagnosis not present

## 2022-10-30 DIAGNOSIS — R1311 Dysphagia, oral phase: Secondary | ICD-10-CM | POA: Diagnosis not present

## 2022-10-30 DIAGNOSIS — B001 Herpesviral vesicular dermatitis: Secondary | ICD-10-CM | POA: Diagnosis not present

## 2022-10-30 DIAGNOSIS — Z7901 Long term (current) use of anticoagulants: Secondary | ICD-10-CM | POA: Diagnosis not present

## 2022-10-30 DIAGNOSIS — I480 Paroxysmal atrial fibrillation: Secondary | ICD-10-CM | POA: Diagnosis not present

## 2022-10-30 DIAGNOSIS — E119 Type 2 diabetes mellitus without complications: Secondary | ICD-10-CM | POA: Diagnosis not present

## 2022-10-30 DIAGNOSIS — C7951 Secondary malignant neoplasm of bone: Secondary | ICD-10-CM | POA: Diagnosis not present

## 2022-10-30 DIAGNOSIS — C499 Malignant neoplasm of connective and soft tissue, unspecified: Secondary | ICD-10-CM | POA: Diagnosis not present

## 2022-10-30 DIAGNOSIS — K5900 Constipation, unspecified: Secondary | ICD-10-CM | POA: Diagnosis not present

## 2022-10-30 DIAGNOSIS — C7800 Secondary malignant neoplasm of unspecified lung: Secondary | ICD-10-CM | POA: Diagnosis not present

## 2022-10-30 DIAGNOSIS — I10 Essential (primary) hypertension: Secondary | ICD-10-CM | POA: Diagnosis not present

## 2022-10-30 DIAGNOSIS — L89152 Pressure ulcer of sacral region, stage 2: Secondary | ICD-10-CM | POA: Diagnosis not present

## 2022-10-31 ENCOUNTER — Other Ambulatory Visit: Payer: Self-pay | Admitting: Cardiology

## 2022-11-01 DIAGNOSIS — Z7901 Long term (current) use of anticoagulants: Secondary | ICD-10-CM | POA: Diagnosis not present

## 2022-11-01 DIAGNOSIS — M84552D Pathological fracture in neoplastic disease, left femur, subsequent encounter for fracture with routine healing: Secondary | ICD-10-CM | POA: Diagnosis not present

## 2022-11-01 DIAGNOSIS — E119 Type 2 diabetes mellitus without complications: Secondary | ICD-10-CM | POA: Diagnosis not present

## 2022-11-01 DIAGNOSIS — I25729 Atherosclerosis of autologous artery coronary artery bypass graft(s) with unspecified angina pectoris: Secondary | ICD-10-CM | POA: Diagnosis not present

## 2022-11-01 DIAGNOSIS — L89152 Pressure ulcer of sacral region, stage 2: Secondary | ICD-10-CM | POA: Diagnosis not present

## 2022-11-01 DIAGNOSIS — E785 Hyperlipidemia, unspecified: Secondary | ICD-10-CM | POA: Diagnosis not present

## 2022-11-01 DIAGNOSIS — R1311 Dysphagia, oral phase: Secondary | ICD-10-CM | POA: Diagnosis not present

## 2022-11-01 DIAGNOSIS — I480 Paroxysmal atrial fibrillation: Secondary | ICD-10-CM | POA: Diagnosis not present

## 2022-11-01 DIAGNOSIS — C499 Malignant neoplasm of connective and soft tissue, unspecified: Secondary | ICD-10-CM | POA: Diagnosis not present

## 2022-11-01 DIAGNOSIS — B001 Herpesviral vesicular dermatitis: Secondary | ICD-10-CM | POA: Diagnosis not present

## 2022-11-01 DIAGNOSIS — Z79891 Long term (current) use of opiate analgesic: Secondary | ICD-10-CM | POA: Diagnosis not present

## 2022-11-01 DIAGNOSIS — C7951 Secondary malignant neoplasm of bone: Secondary | ICD-10-CM | POA: Diagnosis not present

## 2022-11-01 DIAGNOSIS — C7801 Secondary malignant neoplasm of right lung: Principal | ICD-10-CM

## 2022-11-01 DIAGNOSIS — C7802 Secondary malignant neoplasm of left lung: Principal | ICD-10-CM

## 2022-11-01 DIAGNOSIS — K5900 Constipation, unspecified: Secondary | ICD-10-CM | POA: Diagnosis not present

## 2022-11-01 DIAGNOSIS — N400 Enlarged prostate without lower urinary tract symptoms: Secondary | ICD-10-CM | POA: Diagnosis not present

## 2022-11-01 DIAGNOSIS — C7800 Secondary malignant neoplasm of unspecified lung: Secondary | ICD-10-CM | POA: Diagnosis not present

## 2022-11-01 DIAGNOSIS — I10 Essential (primary) hypertension: Secondary | ICD-10-CM | POA: Diagnosis not present

## 2022-11-02 ENCOUNTER — Other Ambulatory Visit: Payer: Self-pay | Admitting: Cardiology

## 2022-11-06 DIAGNOSIS — I10 Essential (primary) hypertension: Secondary | ICD-10-CM | POA: Diagnosis not present

## 2022-11-06 DIAGNOSIS — E785 Hyperlipidemia, unspecified: Secondary | ICD-10-CM | POA: Diagnosis not present

## 2022-11-06 DIAGNOSIS — Z7901 Long term (current) use of anticoagulants: Secondary | ICD-10-CM | POA: Diagnosis not present

## 2022-11-06 DIAGNOSIS — C499 Malignant neoplasm of connective and soft tissue, unspecified: Secondary | ICD-10-CM | POA: Diagnosis not present

## 2022-11-06 DIAGNOSIS — R1311 Dysphagia, oral phase: Secondary | ICD-10-CM | POA: Diagnosis not present

## 2022-11-06 DIAGNOSIS — Z79891 Long term (current) use of opiate analgesic: Secondary | ICD-10-CM | POA: Diagnosis not present

## 2022-11-06 DIAGNOSIS — N400 Enlarged prostate without lower urinary tract symptoms: Secondary | ICD-10-CM | POA: Diagnosis not present

## 2022-11-06 DIAGNOSIS — L89152 Pressure ulcer of sacral region, stage 2: Secondary | ICD-10-CM | POA: Diagnosis not present

## 2022-11-06 DIAGNOSIS — M84552D Pathological fracture in neoplastic disease, left femur, subsequent encounter for fracture with routine healing: Secondary | ICD-10-CM | POA: Diagnosis not present

## 2022-11-06 DIAGNOSIS — E119 Type 2 diabetes mellitus without complications: Secondary | ICD-10-CM | POA: Diagnosis not present

## 2022-11-06 DIAGNOSIS — C7800 Secondary malignant neoplasm of unspecified lung: Secondary | ICD-10-CM | POA: Diagnosis not present

## 2022-11-06 DIAGNOSIS — C7951 Secondary malignant neoplasm of bone: Secondary | ICD-10-CM | POA: Diagnosis not present

## 2022-11-06 DIAGNOSIS — I25729 Atherosclerosis of autologous artery coronary artery bypass graft(s) with unspecified angina pectoris: Secondary | ICD-10-CM | POA: Diagnosis not present

## 2022-11-06 DIAGNOSIS — K5900 Constipation, unspecified: Secondary | ICD-10-CM | POA: Diagnosis not present

## 2022-11-06 DIAGNOSIS — B001 Herpesviral vesicular dermatitis: Secondary | ICD-10-CM | POA: Diagnosis not present

## 2022-11-06 DIAGNOSIS — I480 Paroxysmal atrial fibrillation: Secondary | ICD-10-CM | POA: Diagnosis not present

## 2022-11-07 DIAGNOSIS — R1311 Dysphagia, oral phase: Secondary | ICD-10-CM | POA: Diagnosis not present

## 2022-11-07 DIAGNOSIS — C7800 Secondary malignant neoplasm of unspecified lung: Secondary | ICD-10-CM | POA: Diagnosis not present

## 2022-11-07 DIAGNOSIS — M84552D Pathological fracture in neoplastic disease, left femur, subsequent encounter for fracture with routine healing: Secondary | ICD-10-CM | POA: Diagnosis not present

## 2022-11-07 DIAGNOSIS — E785 Hyperlipidemia, unspecified: Secondary | ICD-10-CM | POA: Diagnosis not present

## 2022-11-07 DIAGNOSIS — K5900 Constipation, unspecified: Secondary | ICD-10-CM | POA: Diagnosis not present

## 2022-11-07 DIAGNOSIS — B001 Herpesviral vesicular dermatitis: Secondary | ICD-10-CM | POA: Diagnosis not present

## 2022-11-07 DIAGNOSIS — E119 Type 2 diabetes mellitus without complications: Secondary | ICD-10-CM | POA: Diagnosis not present

## 2022-11-07 DIAGNOSIS — I25729 Atherosclerosis of autologous artery coronary artery bypass graft(s) with unspecified angina pectoris: Secondary | ICD-10-CM | POA: Diagnosis not present

## 2022-11-07 DIAGNOSIS — C499 Malignant neoplasm of connective and soft tissue, unspecified: Secondary | ICD-10-CM | POA: Diagnosis not present

## 2022-11-07 DIAGNOSIS — N400 Enlarged prostate without lower urinary tract symptoms: Secondary | ICD-10-CM | POA: Diagnosis not present

## 2022-11-07 DIAGNOSIS — L89152 Pressure ulcer of sacral region, stage 2: Secondary | ICD-10-CM | POA: Diagnosis not present

## 2022-11-07 DIAGNOSIS — Z79891 Long term (current) use of opiate analgesic: Secondary | ICD-10-CM | POA: Diagnosis not present

## 2022-11-07 DIAGNOSIS — I10 Essential (primary) hypertension: Secondary | ICD-10-CM | POA: Diagnosis not present

## 2022-11-07 DIAGNOSIS — C7951 Secondary malignant neoplasm of bone: Secondary | ICD-10-CM | POA: Diagnosis not present

## 2022-11-07 DIAGNOSIS — Z7901 Long term (current) use of anticoagulants: Secondary | ICD-10-CM | POA: Diagnosis not present

## 2022-11-07 DIAGNOSIS — I480 Paroxysmal atrial fibrillation: Secondary | ICD-10-CM | POA: Diagnosis not present

## 2022-11-08 ENCOUNTER — Ambulatory Visit
Admit: 2022-11-08 | Discharge: 2022-11-09 | Payer: MEDICARE | Attending: Rehabilitative and Restorative Service Providers" | Primary: Rehabilitative and Restorative Service Providers"

## 2022-11-08 DIAGNOSIS — Z7901 Long term (current) use of anticoagulants: Secondary | ICD-10-CM | POA: Diagnosis not present

## 2022-11-08 DIAGNOSIS — B001 Herpesviral vesicular dermatitis: Secondary | ICD-10-CM | POA: Diagnosis not present

## 2022-11-08 DIAGNOSIS — K5900 Constipation, unspecified: Secondary | ICD-10-CM | POA: Diagnosis not present

## 2022-11-08 DIAGNOSIS — I25729 Atherosclerosis of autologous artery coronary artery bypass graft(s) with unspecified angina pectoris: Secondary | ICD-10-CM | POA: Diagnosis not present

## 2022-11-08 DIAGNOSIS — M84552D Pathological fracture in neoplastic disease, left femur, subsequent encounter for fracture with routine healing: Secondary | ICD-10-CM | POA: Diagnosis not present

## 2022-11-08 DIAGNOSIS — N400 Enlarged prostate without lower urinary tract symptoms: Secondary | ICD-10-CM | POA: Diagnosis not present

## 2022-11-08 DIAGNOSIS — C7800 Secondary malignant neoplasm of unspecified lung: Secondary | ICD-10-CM | POA: Diagnosis not present

## 2022-11-08 DIAGNOSIS — E119 Type 2 diabetes mellitus without complications: Secondary | ICD-10-CM | POA: Diagnosis not present

## 2022-11-08 DIAGNOSIS — R1311 Dysphagia, oral phase: Secondary | ICD-10-CM | POA: Diagnosis not present

## 2022-11-08 DIAGNOSIS — I480 Paroxysmal atrial fibrillation: Secondary | ICD-10-CM | POA: Diagnosis not present

## 2022-11-08 DIAGNOSIS — Z89611 Acquired absence of right leg above knee: Secondary | ICD-10-CM | POA: Diagnosis not present

## 2022-11-08 DIAGNOSIS — I10 Essential (primary) hypertension: Secondary | ICD-10-CM | POA: Diagnosis not present

## 2022-11-08 DIAGNOSIS — L89152 Pressure ulcer of sacral region, stage 2: Secondary | ICD-10-CM | POA: Diagnosis not present

## 2022-11-08 DIAGNOSIS — Z79891 Long term (current) use of opiate analgesic: Secondary | ICD-10-CM | POA: Diagnosis not present

## 2022-11-08 DIAGNOSIS — E785 Hyperlipidemia, unspecified: Secondary | ICD-10-CM | POA: Diagnosis not present

## 2022-11-08 DIAGNOSIS — C499 Malignant neoplasm of connective and soft tissue, unspecified: Secondary | ICD-10-CM | POA: Diagnosis not present

## 2022-11-08 DIAGNOSIS — C7951 Secondary malignant neoplasm of bone: Secondary | ICD-10-CM | POA: Diagnosis not present

## 2022-12-13 DEATH — deceased
